# Patient Record
Sex: Female | Born: 1950
Health system: Southern US, Community
[De-identification: ages and names within clinical notes are randomized; demographics above are authoritative.]

## PROBLEM LIST (undated history)

## (undated) DIAGNOSIS — E039 Hypothyroidism, unspecified: Secondary | ICD-10-CM

## (undated) DIAGNOSIS — C50911 Malignant neoplasm of unspecified site of right female breast: Secondary | ICD-10-CM

## (undated) DIAGNOSIS — E7800 Pure hypercholesterolemia, unspecified: Secondary | ICD-10-CM

## (undated) DIAGNOSIS — E119 Type 2 diabetes mellitus without complications: Secondary | ICD-10-CM

## (undated) DIAGNOSIS — I499 Cardiac arrhythmia, unspecified: Secondary | ICD-10-CM

## (undated) DIAGNOSIS — E042 Nontoxic multinodular goiter: Secondary | ICD-10-CM

## (undated) DIAGNOSIS — Z96651 Presence of right artificial knee joint: Secondary | ICD-10-CM

## (undated) DIAGNOSIS — Z8614 Personal history of Methicillin resistant Staphylococcus aureus infection: Secondary | ICD-10-CM

## (undated) DIAGNOSIS — R011 Cardiac murmur, unspecified: Secondary | ICD-10-CM

## (undated) DIAGNOSIS — F40240 Claustrophobia: Secondary | ICD-10-CM

## (undated) DIAGNOSIS — Z9289 Personal history of other medical treatment: Secondary | ICD-10-CM

## (undated) DIAGNOSIS — M1711 Unilateral primary osteoarthritis, right knee: Secondary | ICD-10-CM

## (undated) DIAGNOSIS — Z973 Presence of spectacles and contact lenses: Secondary | ICD-10-CM

## (undated) DIAGNOSIS — Z8619 Personal history of other infectious and parasitic diseases: Secondary | ICD-10-CM

## (undated) DIAGNOSIS — G629 Polyneuropathy, unspecified: Secondary | ICD-10-CM

## (undated) DIAGNOSIS — K219 Gastro-esophageal reflux disease without esophagitis: Secondary | ICD-10-CM

## (undated) DIAGNOSIS — I10 Essential (primary) hypertension: Secondary | ICD-10-CM

## (undated) DIAGNOSIS — G971 Other reaction to spinal and lumbar puncture: Secondary | ICD-10-CM

## (undated) DIAGNOSIS — Z8719 Personal history of other diseases of the digestive system: Secondary | ICD-10-CM

## (undated) DIAGNOSIS — M1990 Unspecified osteoarthritis, unspecified site: Secondary | ICD-10-CM

## (undated) DIAGNOSIS — Z8669 Personal history of other diseases of the nervous system and sense organs: Secondary | ICD-10-CM

## (undated) DIAGNOSIS — Z0000 Encounter for general adult medical examination without abnormal findings: Principal | ICD-10-CM

## (undated) DIAGNOSIS — M5431 Sciatica, right side: Secondary | ICD-10-CM

## (undated) HISTORY — DX: Sciatica, right side: M54.31

## (undated) HISTORY — PX: VULVA / PERINEUM BIOPSY: SUR155

## (undated) HISTORY — PX: PLANTAR FASCIA SURGERY: SHX746

## (undated) HISTORY — PX: TUBAL LIGATION: SHX77

## (undated) HISTORY — DX: Encounter for general adult medical examination without abnormal findings: Z00.00

## (undated) HISTORY — DX: Presence of right artificial knee joint: Z96.651

## (undated) HISTORY — PX: BREAST BIOPSY: SHX20

## (undated) HISTORY — DX: Personal history of other infectious and parasitic diseases: Z86.19

## (undated) HISTORY — PX: ESOPHAGOGASTRODUODENOSCOPY: SHX1529

## (undated) HISTORY — PX: COLONOSCOPY: SHX174

## (undated) HISTORY — DX: Personal history of Methicillin resistant Staphylococcus aureus infection: Z86.14

## (undated) HISTORY — PX: HERNIA REPAIR: SHX51

---

## 1974-04-21 HISTORY — PX: TOTAL THYROIDECTOMY: SHX2547

## 1984-04-21 HISTORY — PX: UMBILICAL HERNIA REPAIR: SHX196

## 1984-04-21 HISTORY — PX: ABDOMINOPLASTY: SUR9

## 1986-04-21 HISTORY — PX: ABDOMINAL HYSTERECTOMY: SHX81

## 1997-04-21 HISTORY — PX: CARDIAC CATHETERIZATION: SHX172

## 1997-12-05 ENCOUNTER — Encounter: Admission: RE | Admit: 1997-12-05 | Discharge: 1997-12-05 | Payer: Self-pay | Admitting: *Deleted

## 1998-01-11 ENCOUNTER — Ambulatory Visit (HOSPITAL_COMMUNITY): Admission: RE | Admit: 1998-01-11 | Discharge: 1998-01-11 | Payer: Self-pay | Admitting: Internal Medicine

## 1998-01-16 ENCOUNTER — Encounter: Payer: Self-pay | Admitting: Internal Medicine

## 1998-01-16 ENCOUNTER — Ambulatory Visit (HOSPITAL_COMMUNITY): Admission: RE | Admit: 1998-01-16 | Discharge: 1998-01-16 | Payer: Self-pay | Admitting: Internal Medicine

## 1998-03-06 ENCOUNTER — Ambulatory Visit (HOSPITAL_COMMUNITY): Admission: RE | Admit: 1998-03-06 | Discharge: 1998-03-06 | Payer: Self-pay | Admitting: Internal Medicine

## 1999-03-11 ENCOUNTER — Other Ambulatory Visit: Admission: RE | Admit: 1999-03-11 | Discharge: 1999-03-11 | Payer: Self-pay | Admitting: Internal Medicine

## 2000-01-23 ENCOUNTER — Ambulatory Visit (HOSPITAL_COMMUNITY): Admission: RE | Admit: 2000-01-23 | Discharge: 2000-01-23 | Payer: Self-pay | Admitting: Gastroenterology

## 2000-07-16 ENCOUNTER — Other Ambulatory Visit: Admission: RE | Admit: 2000-07-16 | Discharge: 2000-07-16 | Payer: Self-pay | Admitting: Internal Medicine

## 2000-12-16 ENCOUNTER — Encounter: Payer: Self-pay | Admitting: Internal Medicine

## 2000-12-16 ENCOUNTER — Encounter: Admission: RE | Admit: 2000-12-16 | Discharge: 2000-12-16 | Payer: Self-pay | Admitting: Internal Medicine

## 2002-06-15 ENCOUNTER — Other Ambulatory Visit: Admission: RE | Admit: 2002-06-15 | Discharge: 2002-06-15 | Payer: Self-pay | Admitting: Obstetrics and Gynecology

## 2002-10-20 ENCOUNTER — Encounter: Payer: Self-pay | Admitting: Sports Medicine

## 2002-10-20 ENCOUNTER — Encounter: Admission: RE | Admit: 2002-10-20 | Discharge: 2002-10-20 | Payer: Self-pay | Admitting: Sports Medicine

## 2003-07-17 ENCOUNTER — Other Ambulatory Visit: Admission: RE | Admit: 2003-07-17 | Discharge: 2003-07-17 | Payer: Self-pay | Admitting: Obstetrics and Gynecology

## 2003-10-04 ENCOUNTER — Ambulatory Visit (HOSPITAL_COMMUNITY): Admission: RE | Admit: 2003-10-04 | Discharge: 2003-10-04 | Payer: Self-pay | Admitting: Gastroenterology

## 2003-10-04 ENCOUNTER — Encounter (INDEPENDENT_AMBULATORY_CARE_PROVIDER_SITE_OTHER): Payer: Self-pay | Admitting: Specialist

## 2004-03-22 ENCOUNTER — Ambulatory Visit (HOSPITAL_COMMUNITY): Admission: RE | Admit: 2004-03-22 | Discharge: 2004-03-22 | Payer: Self-pay | Admitting: Gastroenterology

## 2004-03-22 ENCOUNTER — Encounter (INDEPENDENT_AMBULATORY_CARE_PROVIDER_SITE_OTHER): Payer: Self-pay | Admitting: *Deleted

## 2004-05-14 ENCOUNTER — Encounter: Admission: RE | Admit: 2004-05-14 | Discharge: 2004-05-14 | Payer: Self-pay | Admitting: Internal Medicine

## 2004-08-28 ENCOUNTER — Encounter: Admission: RE | Admit: 2004-08-28 | Discharge: 2004-08-28 | Payer: Self-pay | Admitting: Internal Medicine

## 2005-04-21 HISTORY — PX: MASTECTOMY: SHX3

## 2005-06-23 ENCOUNTER — Encounter (INDEPENDENT_AMBULATORY_CARE_PROVIDER_SITE_OTHER): Payer: Self-pay | Admitting: *Deleted

## 2005-06-23 ENCOUNTER — Encounter (INDEPENDENT_AMBULATORY_CARE_PROVIDER_SITE_OTHER): Payer: Self-pay | Admitting: Diagnostic Radiology

## 2005-06-23 ENCOUNTER — Encounter: Admission: RE | Admit: 2005-06-23 | Discharge: 2005-06-23 | Payer: Self-pay | Admitting: Internal Medicine

## 2005-07-02 ENCOUNTER — Encounter: Admission: RE | Admit: 2005-07-02 | Discharge: 2005-07-02 | Payer: Self-pay | Admitting: Internal Medicine

## 2005-07-16 ENCOUNTER — Encounter (INDEPENDENT_AMBULATORY_CARE_PROVIDER_SITE_OTHER): Payer: Self-pay | Admitting: Surgery

## 2005-07-16 ENCOUNTER — Ambulatory Visit (HOSPITAL_BASED_OUTPATIENT_CLINIC_OR_DEPARTMENT_OTHER): Admission: RE | Admit: 2005-07-16 | Discharge: 2005-07-17 | Payer: Self-pay | Admitting: Surgery

## 2005-07-16 ENCOUNTER — Encounter (INDEPENDENT_AMBULATORY_CARE_PROVIDER_SITE_OTHER): Payer: Self-pay | Admitting: Specialist

## 2005-07-29 ENCOUNTER — Ambulatory Visit: Payer: Self-pay | Admitting: Oncology

## 2005-08-06 LAB — CBC WITH DIFFERENTIAL/PLATELET
BASO%: 0.7 % (ref 0.0–2.0)
Basophils Absolute: 0.1 10*3/uL (ref 0.0–0.1)
EOS%: 0.9 % (ref 0.0–7.0)
Eosinophils Absolute: 0.1 10*3/uL (ref 0.0–0.5)
HCT: 40.3 % (ref 34.8–46.6)
HGB: 13.3 g/dL (ref 11.6–15.9)
LYMPH%: 46 % (ref 14.0–48.0)
MCH: 30.7 pg (ref 26.0–34.0)
MCHC: 33 g/dL (ref 32.0–36.0)
MCV: 93 fL (ref 81.0–101.0)
MONO#: 0.7 10*3/uL (ref 0.1–0.9)
MONO%: 9.5 % (ref 0.0–13.0)
NEUT#: 3.3 10*3/uL (ref 1.5–6.5)
NEUT%: 42.9 % (ref 39.6–76.8)
Platelets: 320 10*3/uL (ref 145–400)
RBC: 4.33 10*6/uL (ref 3.70–5.32)
RDW: 13.6 % (ref 11.3–14.5)
WBC: 7.8 10*3/uL (ref 3.9–10.0)
lymph#: 3.6 10*3/uL — ABNORMAL HIGH (ref 0.9–3.3)

## 2005-08-06 LAB — COMPREHENSIVE METABOLIC PANEL
ALT: 19 U/L (ref 0–40)
AST: 16 U/L (ref 0–37)
Albumin: 4.8 g/dL (ref 3.5–5.2)
Alkaline Phosphatase: 69 U/L (ref 39–117)
BUN: 15 mg/dL (ref 6–23)
CO2: 27 mEq/L (ref 19–32)
Calcium: 9.7 mg/dL (ref 8.4–10.5)
Chloride: 101 mEq/L (ref 96–112)
Creatinine, Ser: 0.9 mg/dL (ref 0.4–1.2)
Glucose, Bld: 109 mg/dL — ABNORMAL HIGH (ref 70–99)
Potassium: 4.6 mEq/L (ref 3.5–5.3)
Sodium: 140 mEq/L (ref 135–145)
Total Bilirubin: 0.6 mg/dL (ref 0.3–1.2)
Total Protein: 7.5 g/dL (ref 6.0–8.3)

## 2005-08-06 LAB — CANCER ANTIGEN 27.29: CA 27.29: 16 U/mL (ref 0–39)

## 2005-08-06 LAB — LACTATE DEHYDROGENASE: LDH: 124 U/L (ref 94–250)

## 2005-08-07 ENCOUNTER — Ambulatory Visit (HOSPITAL_COMMUNITY): Admission: RE | Admit: 2005-08-07 | Discharge: 2005-08-07 | Payer: Self-pay | Admitting: Oncology

## 2005-09-29 ENCOUNTER — Ambulatory Visit: Payer: Self-pay | Admitting: Oncology

## 2005-10-01 LAB — CBC WITH DIFFERENTIAL/PLATELET
BASO%: 1.3 % (ref 0.0–2.0)
Basophils Absolute: 0.1 10*3/uL (ref 0.0–0.1)
EOS%: 2.4 % (ref 0.0–7.0)
Eosinophils Absolute: 0.2 10*3/uL (ref 0.0–0.5)
HCT: 39.3 % (ref 34.8–46.6)
HGB: 13 g/dL (ref 11.6–15.9)
LYMPH%: 45.8 % (ref 14.0–48.0)
MCH: 31 pg (ref 26.0–34.0)
MCHC: 33 g/dL (ref 32.0–36.0)
MCV: 94 fL (ref 81.0–101.0)
MONO#: 0.6 10*3/uL (ref 0.1–0.9)
MONO%: 7.3 % (ref 0.0–13.0)
NEUT#: 3.3 10*3/uL (ref 1.5–6.5)
NEUT%: 43.2 % (ref 39.6–76.8)
Platelets: 255 10*3/uL (ref 145–400)
RBC: 4.18 10*6/uL (ref 3.70–5.32)
RDW: 12.9 % (ref 11.3–14.5)
WBC: 7.7 10*3/uL (ref 3.9–10.0)
lymph#: 3.5 10*3/uL — ABNORMAL HIGH (ref 0.9–3.3)

## 2005-10-01 LAB — COMPREHENSIVE METABOLIC PANEL
ALT: 22 U/L (ref 0–40)
AST: 21 U/L (ref 0–37)
Albumin: 4.5 g/dL (ref 3.5–5.2)
Alkaline Phosphatase: 75 U/L (ref 39–117)
BUN: 17 mg/dL (ref 6–23)
CO2: 26 mEq/L (ref 19–32)
Calcium: 9.4 mg/dL (ref 8.4–10.5)
Chloride: 102 mEq/L (ref 96–112)
Creatinine, Ser: 0.91 mg/dL (ref 0.40–1.20)
Glucose, Bld: 121 mg/dL — ABNORMAL HIGH (ref 70–99)
Potassium: 4.4 mEq/L (ref 3.5–5.3)
Sodium: 138 mEq/L (ref 135–145)
Total Bilirubin: 0.5 mg/dL (ref 0.3–1.2)
Total Protein: 7.3 g/dL (ref 6.0–8.3)

## 2005-10-01 LAB — CANCER ANTIGEN 27.29: CA 27.29: 13 U/mL (ref 0–39)

## 2005-10-01 LAB — LACTATE DEHYDROGENASE: LDH: 142 U/L (ref 94–250)

## 2006-01-05 ENCOUNTER — Encounter: Admission: RE | Admit: 2006-01-05 | Discharge: 2006-01-05 | Payer: Self-pay | Admitting: Internal Medicine

## 2006-03-16 ENCOUNTER — Encounter: Admission: RE | Admit: 2006-03-16 | Discharge: 2006-06-14 | Payer: Self-pay | Admitting: Internal Medicine

## 2006-03-24 ENCOUNTER — Ambulatory Visit: Payer: Self-pay | Admitting: Oncology

## 2006-03-25 LAB — COMPREHENSIVE METABOLIC PANEL
ALT: 19 U/L (ref 0–35)
AST: 17 U/L (ref 0–37)
Albumin: 4.7 g/dL (ref 3.5–5.2)
Alkaline Phosphatase: 76 U/L (ref 39–117)
BUN: 15 mg/dL (ref 6–23)
CO2: 26 mEq/L (ref 19–32)
Calcium: 9.8 mg/dL (ref 8.4–10.5)
Chloride: 106 mEq/L (ref 96–112)
Creatinine, Ser: 0.87 mg/dL (ref 0.40–1.20)
Glucose, Bld: 107 mg/dL — ABNORMAL HIGH (ref 70–99)
Potassium: 4.2 mEq/L (ref 3.5–5.3)
Sodium: 141 mEq/L (ref 135–145)
Total Bilirubin: 0.6 mg/dL (ref 0.3–1.2)
Total Protein: 7.3 g/dL (ref 6.0–8.3)

## 2006-03-25 LAB — CBC WITH DIFFERENTIAL/PLATELET
BASO%: 0.8 % (ref 0.0–2.0)
Basophils Absolute: 0.1 10*3/uL (ref 0.0–0.1)
EOS%: 1.8 % (ref 0.0–7.0)
Eosinophils Absolute: 0.1 10*3/uL (ref 0.0–0.5)
HCT: 39.2 % (ref 34.8–46.6)
HGB: 13.1 g/dL (ref 11.6–15.9)
LYMPH%: 49.7 % — ABNORMAL HIGH (ref 14.0–48.0)
MCH: 31 pg (ref 26.0–34.0)
MCHC: 33.4 g/dL (ref 32.0–36.0)
MCV: 92.6 fL (ref 81.0–101.0)
MONO#: 0.5 10*3/uL (ref 0.1–0.9)
MONO%: 7 % (ref 0.0–13.0)
NEUT#: 2.9 10*3/uL (ref 1.5–6.5)
NEUT%: 40.7 % (ref 39.6–76.8)
Platelets: 284 10*3/uL (ref 145–400)
RBC: 4.24 10*6/uL (ref 3.70–5.32)
RDW: 12.8 % (ref 11.3–14.5)
WBC: 7.1 10*3/uL (ref 3.9–10.0)
lymph#: 3.5 10*3/uL — ABNORMAL HIGH (ref 0.9–3.3)

## 2006-03-25 LAB — CANCER ANTIGEN 27.29: CA 27.29: 12 U/mL (ref 0–39)

## 2006-07-20 ENCOUNTER — Encounter: Admission: RE | Admit: 2006-07-20 | Discharge: 2006-10-18 | Payer: Self-pay | Admitting: Internal Medicine

## 2006-09-21 ENCOUNTER — Ambulatory Visit: Payer: Self-pay | Admitting: Oncology

## 2006-09-23 LAB — CBC WITH DIFFERENTIAL/PLATELET
BASO%: 0.7 % (ref 0.0–2.0)
Basophils Absolute: 0 10*3/uL (ref 0.0–0.1)
EOS%: 2.4 % (ref 0.0–7.0)
Eosinophils Absolute: 0.2 10*3/uL (ref 0.0–0.5)
HCT: 39 % (ref 34.8–46.6)
HGB: 13.4 g/dL (ref 11.6–15.9)
LYMPH%: 47.4 % (ref 14.0–48.0)
MCH: 31.7 pg (ref 26.0–34.0)
MCHC: 34.5 g/dL (ref 32.0–36.0)
MCV: 92.1 fL (ref 81.0–101.0)
MONO#: 0.6 10*3/uL (ref 0.1–0.9)
MONO%: 8.5 % (ref 0.0–13.0)
NEUT#: 3 10*3/uL (ref 1.5–6.5)
NEUT%: 41 % (ref 39.6–76.8)
Platelets: 272 10*3/uL (ref 145–400)
RBC: 4.24 10*6/uL (ref 3.70–5.32)
RDW: 12.7 % (ref 11.3–14.5)
WBC: 7.3 10*3/uL (ref 3.9–10.0)
lymph#: 3.5 10*3/uL — ABNORMAL HIGH (ref 0.9–3.3)

## 2006-09-23 LAB — COMPREHENSIVE METABOLIC PANEL
ALT: 45 U/L — ABNORMAL HIGH (ref 0–35)
AST: 33 U/L (ref 0–37)
Albumin: 4.7 g/dL (ref 3.5–5.2)
Alkaline Phosphatase: 74 U/L (ref 39–117)
BUN: 16 mg/dL (ref 6–23)
CO2: 25 mEq/L (ref 19–32)
Calcium: 9.9 mg/dL (ref 8.4–10.5)
Chloride: 104 mEq/L (ref 96–112)
Creatinine, Ser: 0.83 mg/dL (ref 0.40–1.20)
Glucose, Bld: 87 mg/dL (ref 70–99)
Potassium: 4.4 mEq/L (ref 3.5–5.3)
Sodium: 139 mEq/L (ref 135–145)
Total Bilirubin: 0.5 mg/dL (ref 0.3–1.2)
Total Protein: 7.4 g/dL (ref 6.0–8.3)

## 2006-09-23 LAB — CANCER ANTIGEN 27.29: CA 27.29: 14 U/mL (ref 0–39)

## 2007-03-22 ENCOUNTER — Ambulatory Visit: Payer: Self-pay | Admitting: Oncology

## 2007-04-29 LAB — CBC WITH DIFFERENTIAL/PLATELET
BASO%: 0.4 % (ref 0.0–2.0)
Basophils Absolute: 0 10*3/uL (ref 0.0–0.1)
EOS%: 1.1 % (ref 0.0–7.0)
Eosinophils Absolute: 0.1 10*3/uL (ref 0.0–0.5)
HCT: 41.7 % (ref 34.8–46.6)
HGB: 14.2 g/dL (ref 11.6–15.9)
LYMPH%: 53.8 % — ABNORMAL HIGH (ref 14.0–48.0)
MCH: 31.6 pg (ref 26.0–34.0)
MCHC: 34 g/dL (ref 32.0–36.0)
MCV: 92.8 fL (ref 81.0–101.0)
MONO#: 0.4 10*3/uL (ref 0.1–0.9)
MONO%: 6.2 % (ref 0.0–13.0)
NEUT#: 2.7 10*3/uL (ref 1.5–6.5)
NEUT%: 38.5 % — ABNORMAL LOW (ref 39.6–76.8)
Platelets: 286 10*3/uL (ref 145–400)
RBC: 4.49 10*6/uL (ref 3.70–5.32)
RDW: 13 % (ref 11.3–14.5)
WBC: 7.1 10*3/uL (ref 3.9–10.0)
lymph#: 3.8 10*3/uL — ABNORMAL HIGH (ref 0.9–3.3)

## 2007-04-29 LAB — COMPREHENSIVE METABOLIC PANEL
ALT: 19 U/L (ref 0–35)
AST: 18 U/L (ref 0–37)
Albumin: 4.7 g/dL (ref 3.5–5.2)
Alkaline Phosphatase: 65 U/L (ref 39–117)
BUN: 13 mg/dL (ref 6–23)
CO2: 25 mEq/L (ref 19–32)
Calcium: 9.6 mg/dL (ref 8.4–10.5)
Chloride: 106 mEq/L (ref 96–112)
Creatinine, Ser: 0.79 mg/dL (ref 0.40–1.20)
Glucose, Bld: 101 mg/dL — ABNORMAL HIGH (ref 70–99)
Potassium: 4.3 mEq/L (ref 3.5–5.3)
Sodium: 143 mEq/L (ref 135–145)
Total Bilirubin: 0.7 mg/dL (ref 0.3–1.2)
Total Protein: 7.4 g/dL (ref 6.0–8.3)

## 2007-04-29 LAB — CANCER ANTIGEN 27.29: CA 27.29: 11 U/mL (ref 0–39)

## 2007-10-11 ENCOUNTER — Ambulatory Visit: Payer: Self-pay | Admitting: Oncology

## 2007-10-13 LAB — CBC WITH DIFFERENTIAL/PLATELET
BASO%: 0.6 % (ref 0.0–2.0)
Basophils Absolute: 0 10*3/uL (ref 0.0–0.1)
EOS%: 1.6 % (ref 0.0–7.0)
Eosinophils Absolute: 0.1 10*3/uL (ref 0.0–0.5)
HCT: 39.2 % (ref 34.8–46.6)
HGB: 13.2 g/dL (ref 11.6–15.9)
LYMPH%: 42.8 % (ref 14.0–48.0)
MCH: 31.2 pg (ref 26.0–34.0)
MCHC: 33.6 g/dL (ref 32.0–36.0)
MCV: 92.9 fL (ref 81.0–101.0)
MONO#: 0.6 10*3/uL (ref 0.1–0.9)
MONO%: 8.6 % (ref 0.0–13.0)
NEUT#: 3.2 10*3/uL (ref 1.5–6.5)
NEUT%: 46.4 % (ref 39.6–76.8)
Platelets: 264 10*3/uL (ref 145–400)
RBC: 4.22 10*6/uL (ref 3.70–5.32)
RDW: 12.8 % (ref 11.3–14.5)
WBC: 6.8 10*3/uL (ref 3.9–10.0)
lymph#: 2.9 10*3/uL (ref 0.9–3.3)

## 2007-10-13 LAB — COMPREHENSIVE METABOLIC PANEL
ALT: 19 U/L (ref 0–35)
AST: 17 U/L (ref 0–37)
Albumin: 4.3 g/dL (ref 3.5–5.2)
Alkaline Phosphatase: 63 U/L (ref 39–117)
BUN: 14 mg/dL (ref 6–23)
CO2: 27 mEq/L (ref 19–32)
Calcium: 9.5 mg/dL (ref 8.4–10.5)
Chloride: 104 mEq/L (ref 96–112)
Creatinine, Ser: 0.86 mg/dL (ref 0.40–1.20)
Glucose, Bld: 89 mg/dL (ref 70–99)
Potassium: 4.5 mEq/L (ref 3.5–5.3)
Sodium: 140 mEq/L (ref 135–145)
Total Bilirubin: 0.6 mg/dL (ref 0.3–1.2)
Total Protein: 6.8 g/dL (ref 6.0–8.3)

## 2007-10-13 LAB — CANCER ANTIGEN 27.29: CA 27.29: 21 U/mL (ref 0–39)

## 2008-02-28 ENCOUNTER — Ambulatory Visit: Payer: Self-pay | Admitting: Oncology

## 2008-03-01 LAB — CBC WITH DIFFERENTIAL/PLATELET
BASO%: 0.9 % (ref 0.0–2.0)
Basophils Absolute: 0.1 10*3/uL (ref 0.0–0.1)
EOS%: 1.7 % (ref 0.0–7.0)
Eosinophils Absolute: 0.1 10*3/uL (ref 0.0–0.5)
HCT: 40.1 % (ref 34.8–46.6)
HGB: 13.6 g/dL (ref 11.6–15.9)
LYMPH%: 44.4 % (ref 14.0–48.0)
MCH: 32.1 pg (ref 26.0–34.0)
MCHC: 33.8 g/dL (ref 32.0–36.0)
MCV: 94.9 fL (ref 81.0–101.0)
MONO#: 0.5 10*3/uL (ref 0.1–0.9)
MONO%: 7.6 % (ref 0.0–13.0)
NEUT#: 3.3 10*3/uL (ref 1.5–6.5)
NEUT%: 45.4 % (ref 39.6–76.8)
Platelets: 236 10*3/uL (ref 145–400)
RBC: 4.23 10*6/uL (ref 3.70–5.32)
RDW: 12.7 % (ref 11.3–14.5)
WBC: 7.2 10*3/uL (ref 3.9–10.0)
lymph#: 3.2 10*3/uL (ref 0.9–3.3)

## 2008-03-01 LAB — COMPREHENSIVE METABOLIC PANEL
ALT: 21 U/L (ref 0–35)
AST: 22 U/L (ref 0–37)
Albumin: 4.2 g/dL (ref 3.5–5.2)
Alkaline Phosphatase: 61 U/L (ref 39–117)
BUN: 14 mg/dL (ref 6–23)
CO2: 29 mEq/L (ref 19–32)
Calcium: 9.7 mg/dL (ref 8.4–10.5)
Chloride: 104 mEq/L (ref 96–112)
Creatinine, Ser: 0.86 mg/dL (ref 0.40–1.20)
Glucose, Bld: 97 mg/dL (ref 70–99)
Potassium: 4.3 mEq/L (ref 3.5–5.3)
Sodium: 140 mEq/L (ref 135–145)
Total Bilirubin: 0.7 mg/dL (ref 0.3–1.2)
Total Protein: 7.1 g/dL (ref 6.0–8.3)

## 2008-12-06 ENCOUNTER — Encounter: Admission: RE | Admit: 2008-12-06 | Discharge: 2008-12-06 | Payer: Self-pay | Admitting: Internal Medicine

## 2008-12-21 ENCOUNTER — Ambulatory Visit: Payer: Self-pay | Admitting: Oncology

## 2008-12-26 LAB — COMPREHENSIVE METABOLIC PANEL
ALT: 22 U/L (ref 0–35)
AST: 22 U/L (ref 0–37)
Albumin: 3.9 g/dL (ref 3.5–5.2)
Alkaline Phosphatase: 55 U/L (ref 39–117)
BUN: 16 mg/dL (ref 6–23)
CO2: 28 mEq/L (ref 19–32)
Calcium: 9.5 mg/dL (ref 8.4–10.5)
Chloride: 109 mEq/L (ref 96–112)
Creatinine, Ser: 0.86 mg/dL (ref 0.40–1.20)
Glucose, Bld: 118 mg/dL — ABNORMAL HIGH (ref 70–99)
Potassium: 4.2 mEq/L (ref 3.5–5.3)
Sodium: 142 mEq/L (ref 135–145)
Total Bilirubin: 0.7 mg/dL (ref 0.3–1.2)
Total Protein: 6.8 g/dL (ref 6.0–8.3)

## 2008-12-26 LAB — CBC WITH DIFFERENTIAL/PLATELET
BASO%: 0.6 % (ref 0.0–2.0)
Basophils Absolute: 0 10*3/uL (ref 0.0–0.1)
EOS%: 1.8 % (ref 0.0–7.0)
Eosinophils Absolute: 0.1 10*3/uL (ref 0.0–0.5)
HCT: 40 % (ref 34.8–46.6)
HGB: 13.4 g/dL (ref 11.6–15.9)
LYMPH%: 46.5 % (ref 14.0–49.7)
MCH: 30.9 pg (ref 25.1–34.0)
MCHC: 33.5 g/dL (ref 31.5–36.0)
MCV: 92.2 fL (ref 79.5–101.0)
MONO#: 0.5 10*3/uL (ref 0.1–0.9)
MONO%: 6.9 % (ref 0.0–14.0)
NEUT#: 3.1 10*3/uL (ref 1.5–6.5)
NEUT%: 44.2 % (ref 38.4–76.8)
Platelets: 210 10*3/uL (ref 145–400)
RBC: 4.34 10*6/uL (ref 3.70–5.45)
RDW: 12.5 % (ref 11.2–14.5)
WBC: 7.1 10*3/uL (ref 3.9–10.3)
lymph#: 3.3 10*3/uL (ref 0.9–3.3)

## 2010-01-08 ENCOUNTER — Ambulatory Visit: Payer: Self-pay | Admitting: Oncology

## 2010-01-10 LAB — CBC WITH DIFFERENTIAL/PLATELET
BASO%: 0.6 % (ref 0.0–2.0)
Basophils Absolute: 0.1 10*3/uL (ref 0.0–0.1)
EOS%: 1.3 % (ref 0.0–7.0)
Eosinophils Absolute: 0.1 10*3/uL (ref 0.0–0.5)
HCT: 40 % (ref 34.8–46.6)
HGB: 13.4 g/dL (ref 11.6–15.9)
LYMPH%: 40 % (ref 14.0–49.7)
MCH: 32.1 pg (ref 25.1–34.0)
MCHC: 33.5 g/dL (ref 31.5–36.0)
MCV: 96 fL (ref 79.5–101.0)
MONO#: 0.6 10*3/uL (ref 0.1–0.9)
MONO%: 7 % (ref 0.0–14.0)
NEUT#: 4.2 10*3/uL (ref 1.5–6.5)
NEUT%: 51.1 % (ref 38.4–76.8)
Platelets: 252 10*3/uL (ref 145–400)
RBC: 4.17 10*6/uL (ref 3.70–5.45)
RDW: 12.7 % (ref 11.2–14.5)
WBC: 8.2 10*3/uL (ref 3.9–10.3)
lymph#: 3.3 10*3/uL (ref 0.9–3.3)

## 2010-01-11 LAB — COMPREHENSIVE METABOLIC PANEL
ALT: 23 U/L (ref 0–35)
AST: 25 U/L (ref 0–37)
Albumin: 4.6 g/dL (ref 3.5–5.2)
Alkaline Phosphatase: 62 U/L (ref 39–117)
BUN: 18 mg/dL (ref 6–23)
CO2: 22 mEq/L (ref 19–32)
Calcium: 9.9 mg/dL (ref 8.4–10.5)
Chloride: 105 mEq/L (ref 96–112)
Creatinine, Ser: 0.89 mg/dL (ref 0.40–1.20)
Glucose, Bld: 150 mg/dL — ABNORMAL HIGH (ref 70–99)
Potassium: 4.5 mEq/L (ref 3.5–5.3)
Sodium: 143 mEq/L (ref 135–145)
Total Bilirubin: 0.4 mg/dL (ref 0.3–1.2)
Total Protein: 6.9 g/dL (ref 6.0–8.3)

## 2010-01-11 LAB — CANCER ANTIGEN 27.29: CA 27.29: 15 U/mL (ref 0–39)

## 2010-01-11 LAB — VITAMIN D 25 HYDROXY (VIT D DEFICIENCY, FRACTURES): Vit D, 25-Hydroxy: 61 ng/mL (ref 30–89)

## 2010-04-18 ENCOUNTER — Encounter
Admission: RE | Admit: 2010-04-18 | Discharge: 2010-04-18 | Payer: Self-pay | Source: Home / Self Care | Attending: Internal Medicine | Admitting: Internal Medicine

## 2010-05-12 ENCOUNTER — Encounter: Payer: Self-pay | Admitting: Internal Medicine

## 2010-09-02 ENCOUNTER — Other Ambulatory Visit: Payer: Self-pay | Admitting: Oncology

## 2010-09-02 ENCOUNTER — Encounter (HOSPITAL_BASED_OUTPATIENT_CLINIC_OR_DEPARTMENT_OTHER): Payer: Medicare HMO | Admitting: Oncology

## 2010-09-02 DIAGNOSIS — C50919 Malignant neoplasm of unspecified site of unspecified female breast: Secondary | ICD-10-CM

## 2010-09-02 DIAGNOSIS — Z170 Estrogen receptor positive status [ER+]: Secondary | ICD-10-CM

## 2010-09-02 LAB — COMPREHENSIVE METABOLIC PANEL
ALT: 16 U/L (ref 0–35)
AST: 16 U/L (ref 0–37)
Albumin: 4 g/dL (ref 3.5–5.2)
Alkaline Phosphatase: 62 U/L (ref 39–117)
BUN: 18 mg/dL (ref 6–23)
CO2: 27 mEq/L (ref 19–32)
Calcium: 9.2 mg/dL (ref 8.4–10.5)
Chloride: 106 mEq/L (ref 96–112)
Creatinine, Ser: 0.91 mg/dL (ref 0.40–1.20)
Glucose, Bld: 111 mg/dL — ABNORMAL HIGH (ref 70–99)
Potassium: 4.2 mEq/L (ref 3.5–5.3)
Sodium: 141 mEq/L (ref 135–145)
Total Bilirubin: 0.3 mg/dL (ref 0.3–1.2)
Total Protein: 6.8 g/dL (ref 6.0–8.3)

## 2010-09-02 LAB — CBC WITH DIFFERENTIAL/PLATELET
BASO%: 0.4 % (ref 0.0–2.0)
Basophils Absolute: 0 10*3/uL (ref 0.0–0.1)
EOS%: 1 % (ref 0.0–7.0)
Eosinophils Absolute: 0.1 10*3/uL (ref 0.0–0.5)
HCT: 40.5 % (ref 34.8–46.6)
HGB: 13.8 g/dL (ref 11.6–15.9)
LYMPH%: 42.9 % (ref 14.0–49.7)
MCH: 31.6 pg (ref 25.1–34.0)
MCHC: 34.1 g/dL (ref 31.5–36.0)
MCV: 92.7 fL (ref 79.5–101.0)
MONO#: 0.5 10*3/uL (ref 0.1–0.9)
MONO%: 5.9 % (ref 0.0–14.0)
NEUT#: 3.9 10*3/uL (ref 1.5–6.5)
NEUT%: 49.8 % (ref 38.4–76.8)
Platelets: 250 10*3/uL (ref 145–400)
RBC: 4.37 10*6/uL (ref 3.70–5.45)
RDW: 13.2 % (ref 11.2–14.5)
WBC: 7.9 10*3/uL (ref 3.9–10.3)
lymph#: 3.4 10*3/uL — ABNORMAL HIGH (ref 0.9–3.3)

## 2010-09-06 NOTE — Procedures (Signed)
[place]  Patient:    Victoria Walker, Victoria Walker                  MRN: [id number] Proc. Date: 01/23/00 Adm. Date:  [id number] Attending:  Charna Elizabeth CC:         Lind Guest. August Saucer, M.D.   Procedure Report  DATE OF BIRTH:  November 21, 1990  PROCEDURE:  Esophagogastroduodenoscopy.  ENDOSCOPIST:  Anselmo Rod, M.D.  INSTRUMENT:  Olympus video panendoscope.  INDICATIONS:  Epigastric pain with severe reflux in a 60 year old black female; rule out peptic ulcer disease, esophagitis, gastritis.  PREPROCEDURE PREPARATION:  Informed consent was procured from the patient. The patient was fasted for eight hours prior to the procedure.  PREPROCEDURE PHYSICAL:  VITAL SIGNS:  The patient had stable vital signs.  NECK:  Supple.  CHEST:  Clear to auscultation.  HEART:  S1, S2, regular.  ABDOMEN: Soft with normal abdominal bowel sounds.  DESCRIPTION OF PROCEDURE:  The patient was placed in the left lateral decubitus position and sedated with 40 mg of Demerol, 4 mg of Versed intravenously.  Once the patient was adequately sedated and maintained on low flow oxygen and continuous cardiac monitoring, the Olympus video panendoscope was advanced through the mouthpiece, over the tongue and into the esophagus under direct vision.  The entire esophagus appeared normal without evidence of ring, stricture, masses, lesions or esophagitis.  The scope was then advanced in the stomach.  The entire gastric mucosa appeared healthy and do did the proximal small bowel for 60 cm.  IMPRESSION:  Normal EGD.  RECOMMENDATIONS:  The patient is to follow up in the office on an outpatient basis.  Further recommendations will be made at that time.  Avoidance of nonsteroidals has been emphasized.  Antireflux measures have been discussed with her. DD:  01/23/00 TD:  01/23/00 Job: [id number]

## 2010-09-06 NOTE — Op Note (Signed)
NAMEBONNEE, Victoria Walker           ACCOUNT NO.:  [id number]   MEDICAL RECORD NO.:  [id number]          PATIENT TYPE:  AMB   LOCATION:  ENDO                         FACILITY:  [facility]   PHYSICIAN:  Anselmo Rod, M.D.  DATE OF BIRTH:  1951-04-18   DATE OF PROCEDURE:  03/22/2004  DATE OF DISCHARGE:                                 OPERATIVE REPORT   PROCEDURE PERFORMED:  Esophagogastroduodenoscopy with snare polypectomy x1.   ENDOSCOPIST:  Anselmo Rod, M.D.   INSTRUMENT USED:  Olympus video panendoscope.   INDICATION FOR PROCEDURE:  A 60 year old African-American female with a  history of guaiac-positive stools.  Rule out peptic ulcer disease,  esophagitis, gastritis, etc.   PREPROCEDURE PREPARATION:  Informed consent was procured from the patient.  The patient was fasted for eight hours prior to the procedure.   PREPROCEDURE PHYSICAL:  VITAL SIGNS:  The patient had stable vital signs.  NECK:  Supple.  CHEST:  Clear to auscultation.  S1, S2 regular.  ABDOMEN:  Soft with normal bowel sounds.   DESCRIPTION OF PROCEDURE:  The patient was placed in the left lateral  decubitus position and sedated with 80 mg of Demerol and 10 mg of Versed in  slow incremental doses.  Once the patient was adequately sedate and  maintained on low-flow oxygen and continuous cardiac monitoring, the Olympus  video panendoscope was advanced through the mouthpiece, over the tongue,  into the esophagus under direct vision.  The entire esophagus appeared  normal with no evidence of ring, stricture, mass, esophagitis, of Barrett's  mucosa.  The scope was then passed into the stomach.  A small sessile polyp  was snared from the antrum.  A small hiatal hernia was seen on high  retroflexion.  The proximal small bowel appeared normal.  There was no  outlet obstruction.  The patient tolerated the procedure well without  complications.   IMPRESSION:  1.  Small sessile polyp snared from antrum.  2.  Small  hiatal hernia.  3.  Normal-appearing esophagus and proximal small bowel.   RECOMMENDATIONS:  1.  Await pathology results.  2.  Avoid all nonsteroidals for the next four weeks.  3.  Repeat guaiacs on an outpatient basis.  4.  Outpatient follow-up in the next two weeks for further recommendations.      Jyot   JNM/MEDQ  D:  03/22/2004  T:  03/23/2004  Job:  [id number]   cc:   Maxie Better, M.D.  [address]  [place]  [place] [address]  Fax: [phone]   Lind Guest. August Saucer, M.D.  [address] [address]  [place]  [place] [address]  Fax: [phone]

## 2010-09-06 NOTE — Op Note (Signed)
Victoria Walker, Victoria Walker           ACCOUNT NO.:  [id number]   MEDICAL RECORD NO.:  [id number]          PATIENT TYPE:  AMB   LOCATION:  DSC                          FACILITY:  [facility]   PHYSICIAN:  Thomas A. Cornett, M.D.DATE OF BIRTH:  20-Aug-1950   DATE OF PROCEDURE:  07/16/2005  DATE OF DISCHARGE:                                 OPERATIVE REPORT   PREOPERATIVE DIAGNOSIS:  Right breast ductal carcinoma in situ.   POSTOP DIAGNOSIS:  Right breast ductal carcinoma in situ.   PROCEDURE:  1.  Right simple mastectomy.  2.  Attempted right breast sentinel lymph node mapping with methylene blue      dye.   SURGEON:  Maisie Fus A. Cornett, M.D.   ASSISTANTAngelia Mould. Derrell Lolling, M.D.   ANESTHESIA:  LMA.   ESTIMATED BLOOD LOSS:  50 mL.   SPECIMEN:  Right breast with level I axillary contents to pathology, suture  marks lateral margins.   DRAINS:  Two 19-Blake drains to incision.   INDICATIONS FOR PROCEDURE:  The patient is a 60 year old postmenopausal  female found to have a cluster of calcifications in the right upper outer  quadrant of her breast.  Biopsy showed DCIS.  After discussion of treatment  options she wished to undergo a mastectomy.  I explained the procedure as  well as the need for sentinel lymph node mapping.  She understood both the  above and agreed to proceed after being informed about the procedure,  alternative procedures and potential complications.   DESCRIPTION OF PROCEDURE:  The patient was brought to the operating room  after injection of technetium sulfa colloid in the right breast by nuclear  medicine.  She was then brought to the operating suite and placed supine.  After induction of LMA anesthesia, the right chest, neck, and right axilla  were prepped and draped in a sterile fashion.  The NeoProbe was used; and  there were extremely high.  Baseline counts at the injection site with  absolutely no activity in the right axilla.  Prior to this, I injected 4 mL  of methylene blue dye massaged for 2-3 minutes.  Again, there was no  radioactivity in the right axilla at all.  The internal mammary nodal basin  as well as the subclavian nodal basin had no activity as well.   In light of these findings, I elected to go ahead and proceed with  mastectomy and then try to localize these nodes with the blue dye once we  got over to the axilla.  Skin incisions were made above and below the nipple  areolar complex in a curvilinear fashion to raise both superior inferior  flaps.  Superior flap was taken down first.  This was taken all the way to  the base of the clavicle.  This was taken down to the muscle of the  pectoralis major.  Inferior flap was raised in a similar fashion taking down  to below the inferior mammary fold to the abdominal wall.  Once the flaps  were raised the breast was then dissected in a medial to lateral and  superior to inferior fashion until  was taken off the chest wall completely.  Once I entered the axilla, again, I found no blue dye or other evident  sentinel node.  I went ahead and sampled some of the level I axillary nodes  and sent that with the specimen.   At this point in time, she had no bulky adenopathy that I could palpate or  any other significant abnormalities in her axilla.  This was sent as one  specimen.  Suture mark at the lateral margin.  Irrigation was used and  suctioned out.  There was some oozing, and I controlled this with cautery.  I placed two 19-Blake drains through separate stab incisions in the inferior  flap, and secured them with 3-0 nylon.  I then tacked the flaps down to the  chest wall with interrupted 3-0 Vicryl.  At this point I reapproximated the  skin edges with 3-0 Vicryl, and 4-0 Monocryl was used to close the wound in  a subcuticular fashion.  All final counts of sponge, needle, and instruments  were found be correct.  The bulbs for the [facility] drains were placed to  suction.  Sterile dressings  were applied.  The patient was then awoke, taken  to recovery in satisfactory condition.      Thomas A. Cornett, M.D.  Electronically Signed     TAC/MEDQ  D:  07/16/2005  T:  07/17/2005  Job:  [id number]

## 2010-09-06 NOTE — Op Note (Signed)
NAME:  Victoria Walker, Victoria Walker                     ACCOUNT NO.:  [id number]   MEDICAL RECORD NO.:  [id number]                   PATIENT TYPE:  AMB   LOCATION:  ENDO                                 FACILITY:  [facility]   PHYSICIAN:  Anselmo Rod, M.D.               DATE OF BIRTH:  Nov 01, 1950   DATE OF PROCEDURE:  10/04/2003  DATE OF DISCHARGE:                                 OPERATIVE REPORT   PROCEDURES:  Colonoscopy.   ENDOSCOPIST:  Charna Elizabeth, M.D.   INSTRUMENT USED:  Olympus video colonoscope.   INDICATIONS FOR PROCEDURE:  A 60 year old African-American female undergoing  a screening colonoscopy to rule out colonic polyps, masses, etc.   PREPROCEDURE PREPARATION:  Informed consent was procured from the patient.  The patient was fasted for 8 hr prior to the procedure and prepped with a  bottle of magnesium citrate and a gallon of GoLYTELY the night prior to the  procedure.   PREPROCEDURE PHYSICAL:  VITAL SIGNS:  Stable.  NECK:  Supple.  CHEST:  Clear to auscultation.  HEART:  S1, S2 regular.  ABDOMEN:  Soft with normal bowel sounds.   DESCRIPTION OF PROCEDURE:  The patient was placed in the left lateral  decubitus position and sedated with 90 mg Demerol and 8 mg Versed in slow  incremental doses.  Once the patient was adequately sedated and maintained  on low-flow oxygen and continuous cardiac monitoring, the Olympus video  colonoscope was advanced from the rectum to the cecum.  The patient's  position had to be turned from the left lateral to the spine position, with  gentle application of abdominal pressure to reach the cecal base.  The  appendiceal orifice and the ileocecal valve were clearly visualized and  photographed.  An 8-10 mm sessile polyp was snared from the proximal right  colon.  This was removed by Lucina Mellow retrieval basket, as the polyp was large  and could not pass through the scope.  The scope was completely withdrawn  and the polyp retrieved from the basket.   The scope was then reinserted and  the procedure completed again up to the cecum.  Careful visualization  revealed no evidence of masses or polyps, besides the one mentioned above.  Retroflexion of the rectum revealed no abnormalities.  The patient tolerated  the procedure well and without immediate complications.   IMPRESSION:  An 8-10 mm polyp snared from proximal right colon, retrieved by  Lucina Mellow retrieval basket.  Otherwise, unrevealing colonoscopy.   RECOMMENDATIONS:  1. Await pathology results.  2. Avoid nonsteroidals, including aspirin for the next four weeks.  3. Outpatient follow-up in the next two weeks for further recommendations.  4. Repeat colonoscopy will be done depending on pathology results.  Anselmo Rod, M.D.    JNM/MEDQ  D:  10/04/2003  T:  10/04/2003  Job:  [id number]   cc:   Maxie Better, M.D.  [address]  [place]  [place] [address]  Fax: [phone]   Lind Guest. August Saucer, M.D.  [address] [address]  [place]  [place] [address]  Fax: [phone]

## 2010-09-09 ENCOUNTER — Encounter (HOSPITAL_BASED_OUTPATIENT_CLINIC_OR_DEPARTMENT_OTHER): Payer: Medicare HMO | Admitting: Oncology

## 2010-09-09 DIAGNOSIS — Z170 Estrogen receptor positive status [ER+]: Secondary | ICD-10-CM

## 2010-09-09 DIAGNOSIS — C50919 Malignant neoplasm of unspecified site of unspecified female breast: Secondary | ICD-10-CM

## 2010-10-22 ENCOUNTER — Encounter (HOSPITAL_BASED_OUTPATIENT_CLINIC_OR_DEPARTMENT_OTHER): Payer: Medicare HMO | Admitting: Oncology

## 2010-10-22 DIAGNOSIS — Z170 Estrogen receptor positive status [ER+]: Secondary | ICD-10-CM

## 2010-10-22 DIAGNOSIS — C50919 Malignant neoplasm of unspecified site of unspecified female breast: Secondary | ICD-10-CM

## 2011-03-28 ENCOUNTER — Telehealth: Payer: Self-pay | Admitting: Oncology

## 2011-03-28 NOTE — Telephone Encounter (Signed)
pt called and confirmed appt for 05/06/2011

## 2011-03-28 NOTE — Telephone Encounter (Signed)
called pt lmovm for appt on 05/06/2011 and to rtn call to confirm appts.  pt was already scheduled for a u/s of the left breast for 04/28/2011 @ [facility]

## 2011-05-05 ENCOUNTER — Encounter: Payer: Self-pay | Admitting: *Deleted

## 2011-05-06 ENCOUNTER — Ambulatory Visit (HOSPITAL_BASED_OUTPATIENT_CLINIC_OR_DEPARTMENT_OTHER): Payer: Medicare Other | Admitting: Oncology

## 2011-05-06 VITALS — BP 137/81 | HR 75 | Temp 98.2°F | Ht 65.0 in | Wt 177.2 lb

## 2011-05-06 DIAGNOSIS — C50919 Malignant neoplasm of unspecified site of unspecified female breast: Secondary | ICD-10-CM | POA: Insufficient documentation

## 2011-05-06 DIAGNOSIS — Z853 Personal history of malignant neoplasm of breast: Secondary | ICD-10-CM | POA: Insufficient documentation

## 2011-05-06 NOTE — Progress Notes (Signed)
ID: Victoria Walker  DOB: November 06, 1950  MR#: [id number]  CSN#: [id number]   Interval History:   Victoria Walker returns today to discuss results of her repeat left breast ultrasonography. We had release her from followup, but she found a "bump" in her left breast. This was evaluated at [facility] and found to be an intramammary lymph node. She was set up for a 6 month repeat which was done just last week.  ROS:  The review of systems is otherwise unremarkable. She continues to have left foot pain. She is being referred to a neurologist in [place] for further evaluation of this problem. This keeps her from walking long distances, which he otherwise would like to do, she says. She has little bit of hoarseness, with a history of allergies this time of year. She has mild heartburn. She has a history of urinary infections, but none recent. She feels a little bit forgetful. Otherwise detailed review of systems was noncontributory  Allergies  Allergen Reactions  . Sulfa Antibiotics   . Codeine Anxiety    Current Outpatient Prescriptions  Medication Sig Dispense Refill  . atorvastatin (LIPITOR) 20 MG tablet Take 20 mg by mouth daily.      . calcium-vitamin D (OSCAL WITH D) 250-125 MG-UNIT per tablet Take 1 tablet by mouth daily.      Marland Kitchen diltiazem (TIAZAC) 360 MG 24 hr capsule Take 360 mg by mouth daily.      Marland Kitchen esomeprazole (NEXIUM) 40 MG capsule Take 40 mg by mouth daily before breakfast.      . fexofenadine ([name]) 180 MG tablet Take 180 mg by mouth daily.      . fish oil-omega-3 fatty acids 1000 MG capsule Take 2 g by mouth daily.      . irbesartan (AVAPRO) 150 MG tablet Take 75 mg by mouth at bedtime.      Marland Kitchen levothyroxine (SYNTHROID, LEVOTHROID) 50 MCG tablet Take 50 mcg by mouth daily.      . metFORMIN (GLUCOPHAGE) 500 MG tablet Take 750 mg by mouth daily.       . metoprolol succinate (TOPROL-XL) 25 MG 24 hr tablet Take 25 mg by mouth 2 times daily at 12 noon and 4 pm.       . Multiple Vitamins-Minerals  (MULTIVITAMIN WITH MINERALS) tablet Take 1 tablet by mouth daily.      Marland Kitchen spironolactone (ALDACTONE) 25 MG tablet Take 37.5 mg by mouth daily.      . vitamin E 1000 UNIT capsule Take 1,000 Units by mouth daily.         Objective:  Filed Vitals:   05/06/11 1107  BP: 137/81  Pulse: 75  Temp: 98.2 F (36.8 C)    BMI: Body mass index is 29.49 kg/(m^2).   ECOG FS: 0  Physical Exam:   Sclerae unicteric  Oropharynx clear  No peripheral adenopathy  Lungs clear -- no rales or rhonchi  Heart regular rate and rhythm  Abdomen benign  MSK no focal spinal tenderness, no peripheral edema  Neuro nonfocal  Breast exam: Status post right mastectomy with no evidence of local recurrence. The left breast is unremarkable.  Lab Results:      Chemistry      Component Value Date/Time   NA 141 09/02/2010 1450   K 4.2 09/02/2010 1450   CL 106 09/02/2010 1450   CO2 27 09/02/2010 1450   BUN 18 09/02/2010 1450   CREATININE 0.91 09/02/2010 1450      Component Value Date/Time  CALCIUM 9.2 09/02/2010 1450   ALKPHOS 62 09/02/2010 1450   AST 16 09/02/2010 1450   ALT 16 09/02/2010 1450   BILITOT 0.3 09/02/2010 1450       Lab Results  Component Value Date   WBC 7.9 09/02/2010   HGB 13.8 09/02/2010   HCT 40.5 09/02/2010   MCV 92.7 09/02/2010   PLT 250 09/02/2010   NEUTROABS 3.9 09/02/2010    Studies/Results:  She just had repeat left ultrasonography at [facility]. 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 For this stage I tumor she received letrozole between March 2007 and May 2012   Plan: Victoria Walker is doing well from a breast cancer point of view, with no evidence of disease recurrence. I am comfortable releasing her to Dr. August Saucer, who  follows her closely for her diabetes, hypertension, and other chronic issues. Moderate knows we will be happy to see her at any point in the future. As of now no further appointments have been made for her here.  Victoria Walker C 05/06/2011

## 2011-05-16 ENCOUNTER — Encounter: Payer: Self-pay | Admitting: Oncology

## 2011-07-31 ENCOUNTER — Other Ambulatory Visit: Payer: Self-pay | Admitting: Internal Medicine

## 2011-07-31 ENCOUNTER — Ambulatory Visit
Admission: RE | Admit: 2011-07-31 | Discharge: 2011-07-31 | Disposition: A | Discharge status: Home / Self Care | Payer: Medicare Other | Source: Ambulatory Visit | Attending: Internal Medicine | Admitting: Internal Medicine

## 2012-01-06 DIAGNOSIS — E079 Disorder of thyroid, unspecified: Secondary | ICD-10-CM | POA: Insufficient documentation

## 2012-01-06 DIAGNOSIS — G629 Polyneuropathy, unspecified: Secondary | ICD-10-CM | POA: Insufficient documentation

## 2012-01-06 DIAGNOSIS — H209 Unspecified iridocyclitis: Secondary | ICD-10-CM | POA: Insufficient documentation

## 2012-01-06 DIAGNOSIS — I10 Essential (primary) hypertension: Secondary | ICD-10-CM | POA: Insufficient documentation

## 2012-03-02 ENCOUNTER — Other Ambulatory Visit: Payer: Self-pay | Admitting: Urology

## 2012-03-02 DIAGNOSIS — E119 Type 2 diabetes mellitus without complications: Secondary | ICD-10-CM | POA: Insufficient documentation

## 2012-04-27 ENCOUNTER — Encounter (HOSPITAL_COMMUNITY): Payer: Self-pay | Admitting: Pharmacy Technician

## 2012-04-28 ENCOUNTER — Emergency Department (HOSPITAL_BASED_OUTPATIENT_CLINIC_OR_DEPARTMENT_OTHER)
Admission: EM | Admit: 2012-04-28 | Discharge: 2012-04-28 | Disposition: A | Discharge status: Home / Self Care | Payer: Medicare Other | Attending: Emergency Medicine | Admitting: Emergency Medicine

## 2012-04-28 ENCOUNTER — Encounter (HOSPITAL_BASED_OUTPATIENT_CLINIC_OR_DEPARTMENT_OTHER): Payer: Self-pay | Admitting: Family Medicine

## 2012-04-28 DIAGNOSIS — E1142 Type 2 diabetes mellitus with diabetic polyneuropathy: Secondary | ICD-10-CM | POA: Insufficient documentation

## 2012-04-28 DIAGNOSIS — E7800 Pure hypercholesterolemia, unspecified: Secondary | ICD-10-CM | POA: Insufficient documentation

## 2012-04-28 DIAGNOSIS — Z859 Personal history of malignant neoplasm, unspecified: Secondary | ICD-10-CM | POA: Insufficient documentation

## 2012-04-28 DIAGNOSIS — N898 Other specified noninflammatory disorders of vagina: Secondary | ICD-10-CM

## 2012-04-28 DIAGNOSIS — Z90710 Acquired absence of both cervix and uterus: Secondary | ICD-10-CM | POA: Insufficient documentation

## 2012-04-28 DIAGNOSIS — E1149 Type 2 diabetes mellitus with other diabetic neurological complication: Secondary | ICD-10-CM | POA: Insufficient documentation

## 2012-04-28 DIAGNOSIS — Z79899 Other long term (current) drug therapy: Secondary | ICD-10-CM | POA: Insufficient documentation

## 2012-04-28 DIAGNOSIS — N899 Noninflammatory disorder of vagina, unspecified: Secondary | ICD-10-CM | POA: Insufficient documentation

## 2012-04-28 DIAGNOSIS — I10 Essential (primary) hypertension: Secondary | ICD-10-CM | POA: Insufficient documentation

## 2012-04-28 HISTORY — DX: Pure hypercholesterolemia, unspecified: E78.00

## 2012-04-28 HISTORY — DX: Polyneuropathy, unspecified: G62.9

## 2012-04-28 HISTORY — DX: Essential (primary) hypertension: I10

## 2012-04-28 LAB — WET PREP, GENITAL
Clue Cells Wet Prep HPF POC: NONE SEEN
Trich, Wet Prep: NONE SEEN
Yeast Wet Prep HPF POC: NONE SEEN

## 2012-04-28 LAB — URINE MICROSCOPIC-ADD ON

## 2012-04-28 LAB — URINALYSIS, ROUTINE W REFLEX MICROSCOPIC
Bilirubin Urine: NEGATIVE
Glucose, UA: NEGATIVE mg/dL
Hgb urine dipstick: NEGATIVE
Ketones, ur: NEGATIVE mg/dL
Nitrite: NEGATIVE
Protein, ur: NEGATIVE mg/dL
Specific Gravity, Urine: 1.013 (ref 1.005–1.030)
Urobilinogen, UA: 0.2 mg/dL (ref 0.0–1.0)
pH: 6 (ref 5.0–8.0)

## 2012-04-28 NOTE — ED Provider Notes (Signed)
History     CSN: [id number]  Arrival date & time 04/28/12  1232   First MD Initiated Contact with Patient 04/28/12 1244      Chief Complaint  Patient presents with  . Vaginal Itching    (Consider location/radiation/quality/duration/timing/severity/associated sxs/prior treatment) HPI Comments: Pt state that she is having vaginal itching and discharge:pt states that she is supposed to have a bladder tact  At the end of the month  Patient is a 62 y.o. female presenting with vaginal itching. The history is provided by the patient. No language interpreter was used.  Vaginal Itching This is a new problem. The current episode started in the past 7 days. The problem occurs constantly. The problem has been unchanged. Pertinent negatives include no fever. Nothing aggravates the symptoms. She has tried nothing for the symptoms.    Past Medical History  Diagnosis Date  . Diabetes mellitus without complication   . Hypertension   . Neuropathy   . Cancer   . High cholesterol     Past Surgical History  Procedure Date  . Abdominal hysterectomy     No family history on file.  History  Substance Use Topics  . Smoking status: Never Smoker   . Smokeless tobacco: Not on file  . Alcohol Use: No    OB History    Grav Para Term Preterm Abortions TAB SAB Ect Mult Living                  Review of Systems  Constitutional: Negative for fever.  Respiratory: Negative.   Cardiovascular: Negative.   Genitourinary: Negative for vaginal pain.    Allergies  Penicillins; Sulfa antibiotics; and Codeine  Home Medications   Current Outpatient Rx  Name  Route  Sig  Dispense  Refill  . ATORVASTATIN CALCIUM 20 MG PO TABS   Oral   Take 20 mg by mouth at bedtime.          Marland Kitchen CALCIUM CARBONATE-VITAMIN D 250-125 MG-UNIT PO TABS   Oral   Take 1 tablet by mouth daily.         Marland Kitchen CARBOXYMETHYLCELLUL-GLYCERIN 0.5-0.9 % OP SOLN   Ophthalmic   Apply 1 drop to eye daily.         .  CYCLOSPORINE 0.05 % OP EMUL   Both Eyes   Place 1 drop into both eyes 2 (two) times daily.         Marland Kitchen DILTIAZEM HCL ER BEADS 360 MG PO CP24   Oral   Take 360 mg by mouth every morning.          Marland Kitchen ESOMEPRAZOLE MAGNESIUM 40 MG PO CPDR   Oral   Take 40 mg by mouth daily before breakfast.         . FEXOFENADINE HCL 180 MG PO TABS   Oral   Take 180 mg by mouth every morning.          Marland Kitchen OMEGA-3 FATTY ACIDS 1000 MG PO CAPS   Oral   Take 1 g by mouth daily.          Marland Kitchen GABAPENTIN 300 MG PO CAPS   Oral   Take 300 mg by mouth at bedtime.         . IRBESARTAN 150 MG PO TABS   Oral   Take 75 mg by mouth at bedtime.         Marland Kitchen LEVOTHYROXINE SODIUM 50 MCG PO TABS   Oral   Take 50 mcg by  mouth daily before breakfast.          . LOTEPREDNOL ETABONATE 0.5 % OP SUSP   Both Eyes   Place 1 drop into both eyes 4 (four) times daily.         Marland Kitchen METFORMIN HCL 500 MG PO TABS   Oral   Take 750 mg by mouth daily at 12 noon.          Marland Kitchen METOPROLOL SUCCINATE ER 25 MG PO TB24   Oral   Take 25 mg by mouth 2 times daily at 12 noon and 4 pm.          . MULTI-VITAMIN/MINERALS PO TABS   Oral   Take 1 tablet by mouth daily.         Marland Kitchen SPIRONOLACTONE 25 MG PO TABS   Oral   Take 37.5 mg by mouth every morning.          Marland Kitchen VITAMIN E 1000 UNITS PO CAPS   Oral   Take 1,000 Units by mouth daily.           BP 118/59  Pulse 77  Temp 97.8 F (36.6 C) (Oral)  Resp 18  Ht 5\' 5"  (1.651 m)  Wt 175 lb (79.379 kg)  BMI 29.12 kg/m2  SpO2 97%  Physical Exam  Nursing note and vitals reviewed. Constitutional: She is oriented to person, place, and time. She appears well-developed and well-nourished.  Cardiovascular: Normal rate and regular rhythm.   Pulmonary/Chest: Effort normal and breath sounds normal.  Abdominal: Soft. Bowel sounds are normal. There is no tenderness.  Genitourinary:       Thin white discharge noted  Musculoskeletal: Normal range of motion.  Neurological:  She is alert and oriented to person, place, and time.  Skin: Skin is warm and dry.    ED Course  Procedures (including critical care time)  Labs Reviewed  URINALYSIS, ROUTINE W REFLEX MICROSCOPIC - Abnormal; Notable for the following:    Leukocytes, UA SMALL (*)     All other components within normal limits  WET PREP, GENITAL - Abnormal; Notable for the following:    WBC, Wet Prep HPF POC MODERATE (*)     All other components within normal limits  URINE MICROSCOPIC-ADD ON - Abnormal; Notable for the following:    Squamous Epithelial / LPF FEW (*)     Bacteria, UA FEW (*)     All other components within normal limits  GC/CHLAMYDIA PROBE AMP   No results found.   1. Vaginal irritation       MDM  No definite infection:pt is okay to follow up as needed:urine sent of culture       Teressa Lower, NP 04/28/12 1413

## 2012-04-28 NOTE — ED Provider Notes (Signed)
Medical screening examination/treatment/procedure(s) were performed by non-physician practitioner and as supervising physician I was immediately available for consultation/collaboration.   Dione Booze, MD 04/28/12 [phone]

## 2012-04-28 NOTE — ED Notes (Signed)
Pt c/o vaginal itching and discharge. Pt sts she needs a pap smear and is scheduled for surgery for prolapsed bladder later this month. Pt also c/o dysuria.

## 2012-04-29 LAB — GC/CHLAMYDIA PROBE AMP
CT Probe RNA: NEGATIVE
GC Probe RNA: NEGATIVE

## 2012-04-30 ENCOUNTER — Other Ambulatory Visit (HOSPITAL_COMMUNITY): Payer: Self-pay | Admitting: Urology

## 2012-04-30 NOTE — Patient Instructions (Addendum)
20 Victoria Walker  04/30/2012   Your procedure is scheduled on: 05-11-2012  Report to [facility] at 0515 AM.  Call this number if you have problems the morning of surgery [phone]   Remember:fleetes enema night before surgery   Do not eat food or drink liquids :After Midnight.     Take these medicines the morning of surgery with A SIP OF WATER: diltiazem, nexium, allegra, synthroid, toprol-XL   Do not wear jewelry, make-up or nail polish.  Do not wear lotions, powders, or perfumes. You may wear deodorant.   Men may shave face and neck.  Do not bring valuables to the hospital.  Contacts, dentures or bridgework may not be worn into surgery.  Leave suitcase in the car. After surgery it may be brought to your room.  For patients admitted to the hospital, checkout time is 11:00 AM the day of discharge.     Please read over the following fact sheets that you were given: MRSA Information,  blood fact sheet  Call Birdie Sons RN pre op nurse if needed [phone]    FAILURE TO FOLLOW THESE INSTRUCTIONS MAY RESULT IN THE CANCELLATION OF YOUR SURGERY. PATIENT SIGNATURE___________________________________________

## 2012-05-03 ENCOUNTER — Encounter (HOSPITAL_COMMUNITY): Payer: Self-pay

## 2012-05-03 ENCOUNTER — Encounter (HOSPITAL_COMMUNITY)
Admission: RE | Admit: 2012-05-03 | Discharge: 2012-05-03 | Disposition: A | Discharge status: Home / Self Care | Payer: Medicare Other | Source: Ambulatory Visit | Attending: Urology | Admitting: Urology

## 2012-05-03 HISTORY — DX: Hypothyroidism, unspecified: E03.9

## 2012-05-03 HISTORY — DX: Unspecified osteoarthritis, unspecified site: M19.90

## 2012-05-03 HISTORY — DX: Gastro-esophageal reflux disease without esophagitis: K21.9

## 2012-05-03 LAB — CBC
HCT: 40.7 % (ref 36.0–46.0)
Hemoglobin: 13.8 g/dL (ref 12.0–15.0)
MCH: 31.9 pg (ref 26.0–34.0)
MCHC: 33.9 g/dL (ref 30.0–36.0)
MCV: 94.2 fL (ref 78.0–100.0)
Platelets: 262 10*3/uL (ref 150–400)
RBC: 4.32 MIL/uL (ref 3.87–5.11)
RDW: 12.7 % (ref 11.5–15.5)
WBC: 6.5 10*3/uL (ref 4.0–10.5)

## 2012-05-03 LAB — ABO/RH: ABO/RH(D): O POS

## 2012-05-03 LAB — BASIC METABOLIC PANEL
BUN: 14 mg/dL (ref 6–23)
CO2: 28 mEq/L (ref 19–32)
Calcium: 9.9 mg/dL (ref 8.4–10.5)
Chloride: 104 mEq/L (ref 96–112)
Creatinine, Ser: 0.67 mg/dL (ref 0.50–1.10)
GFR calc Af Amer: >90 mL/min (ref >90)
GFR calc non Af Amer: >90 mL/min (ref >90)
Glucose, Bld: 53 mg/dL — ABNORMAL LOW (ref 70–99)
Potassium: 4.2 mEq/L (ref 3.5–5.1)
Sodium: 140 mEq/L (ref 135–145)

## 2012-05-03 LAB — PROTIME-INR
INR: 0.97 (ref 0.00–1.49)
Prothrombin Time: 12.8 seconds (ref 11.6–15.2)

## 2012-05-03 LAB — SURGICAL PCR SCREEN
MRSA, PCR: NEGATIVE
Staphylococcus aureus: NEGATIVE

## 2012-05-03 LAB — APTT: aPTT: 31 seconds (ref 24–37)

## 2012-05-03 NOTE — Progress Notes (Addendum)
EKG 07/10/09 on chart- will repeat, office note 07/10/09 Dr. Mayford Knife on chart, stress test 07/19/09 on chart, ECHO 07/19/09 on chart, chest x-ray 07/31/11 on EPIC

## 2012-05-10 MED ORDER — GENTAMICIN SULFATE 40 MG/ML IJ SOLN
330.0000 mg | INTRAVENOUS | Status: DC
  Filled 2012-05-10: qty 8.25

## 2012-05-10 NOTE — H&P (Signed)
History of Present Illness   Victoria Walker has pelvic organ prolapse. She has urgency and may have a little bit of urge incontinence. She has urethral dilations for poor flow by Dr. Brunilda Payor. She has long-term vaginal dryness and intermittent dyspareunia. She has a history of breast cancer and cannot take Estrace. She can feel and see vaginal bulging. She has had a hysterectomy. She was here to discuss urodynamics. She has a grade 3 cystocele. Vaginal cuff descended 3-4 cm. She had a mild posterior defect. I thought if she ever had surgery, she likely best be served by a transvaginal vault suspension with cystocele repair and graft and an intraoperative decision whether or not she would require a rectocele repair.   She did not void and was catheterized for 100 mL. Her initial residual on the first visit was 35 mL. Her maximum capacity is 485 mL. Her bladder was stable. She had mild leakage with a Valsalva pressure of 68 cm of water. The leak-point pressure was 71 cm of water and it was mild with the prolapse reduced. During voluntary voiding, she voided 483 mL with a maximum flow of 23 mL per second. Maximum voiding pressure was 25 cm of water. She emptied efficiently. EMG activity mildly increased during the voiding phase. Fluoroscopically she had a large cystocele. Her bladder was hypersensitive. The details of the urodynamics are signed and dictated on the urodynamic sheet.   Review of Systems: No change in bowel or neurologic systems.    Past Medical History Problems  1. History of  Anxiety (Symptom) 300.00 2. History of  Arthritis V13.4 3. History of  Breast Cancer V10.3 4. History of  Depression 311 5. History of  Diabetes Mellitus 250.00 6. History of  Functional Murmur 785.2 7. History of  Heartburn 787.1 8. History of  Hypercholesterolemia 272.0 9. History of  Hypertension 401.9  Surgical History Problems  1. History of  Breast Surgery Mastectomy 2. History of  Hysterectomy 3. History  of  Thyroid Surgery  Current Meds 1. [name] 180 MG TABS; Therapy: (Recorded:03Dec2007) to 2. Avapro 75 MG Oral Tablet; Therapy: (Recorded:03Dec2007) to 3. Diltiazem HCl ER Coated Beads 360 MG Oral Capsule Extended Release 24 Hour; Therapy:  21Feb2013 to 4. Gabapentin 300 MG Oral Capsule; Therapy: 03Aug2013 to 5. Levothyroxine Sodium 50 MCG Oral Tablet; Therapy: 21Feb2013 to 6. Lipitor 20 MG Oral Tablet; Therapy: (Recorded:03Dec2007) to 7. Lotemax 0.5 % Ophthalmic Suspension; Therapy: 28Jun2013 to 8. MetFORMIN HCl 500 MG Oral Tablet; Therapy: (Recorded:03Dec2007) to 9. Metoprolol Succinate ER 25 MG Oral Tablet Extended Release 24 Hour; Therapy: 21Apr2012 to 10. Nasonex 50 MCG/ACT Nasal Suspension; Therapy: 28Mar2013 to 11. NexIUM 40 MG Oral Packet; Therapy: (Recorded:14Mar2012) to 12. Omega 3 CAPS; Therapy: (Recorded:17Sep2012) to 13. Oscal 500/200 D-3 TABS; Therapy: (Recorded:03Oct2013) to 14. Spironolactone 25 MG Oral Tablet; Therapy: (Recorded:08Jun2009) to 15. Vitamin E CAPS; Therapy: (Recorded:17Sep2012) to  Allergies Medication  1. Nitrofurantoin Macrocrystal CAPS 2. Codeine Derivatives 3. Penicillins 4. Sulfa Drugs  Family History Problems  1. Family history of  Death In The Family Father 18 2. Family history of  Death In The Family Mother 12 3. Sororal history of  Diabetes Mellitus V18.0 4. Fraternal history of  Diabetes Mellitus V18.0 5. Family history of  Family Health Status Number Of Children 1 son; 2 daughters 6. Sororal history of  Hypertension 7. Fraternal history of  Hypertension 8. Paternal history of  Lung Cancer V16.1 9. Maternal history of  Nephrolithiasis 10. Paternal history of  Prostate Cancer  Z61.09  Social History Problems  1. Marital History - Currently Married 2. Never A Smoker 3. 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 History of  Alcohol Use 5. History of  Caffeine Use 6. History of  Tobacco Use  Assessment Assessed  1.  Urinary Frequency 2. Cystocele 596.89  Plan Cystocele (596.89)  1. Follow-up PRN Office  Follow-up  Requested for: 11Nov2013  Discussion/Summary   I drew Mr. and Mrs. Minnie a picture. I went over a transvaginal vault suspension, cystocele repair and graft and possible rectocele repair. I talked about a pessary, but she has already failed one. I talked about watchful waiting.   I drew her a picture and we talked about prolapse surgery in detail. Pros, cons, general surgical and anesthetic risks, and other options including behavioral therapy, pessaries, and watchful waiting were discussed. She understands that prolapse repairs are successful in 80-85% of cases for prolapse symptoms and can recur anteriorly, posteriorly, and/or apically. She understands that in most cases I use a graft and general risks were discussed. Surgical risks were described but not limited to the discussion of injury to neighboring structures including the bowel (with possible life-threatening sepsis and colostomy), bladder, urethra, vagina (all resulting in further surgery), and ureter (resulting in re-implantation). We talked about injury to nerves/soft tissue leading to debilitating and intractable pelvic, abdominal, and lower extremity pain syndromes and neuropathies. The risks of buttock pain, intractable dyspareunia, and vaginal narrowing and shortening with sequelae were discussed. Bleeding risks, transfusion rates, and infection were discussed. The risk of persistent, de novo, or worsening bladder and/or bowel incontinence/dysfunction was discussed. The need for CIC was described as well the usual post-operative course. The patient understands that she might not reach her treatment goal and that she might be worse following surgery.  Because of her poor flow and dilations and her overall clinical picture, I drew her a picture regarding a sling, but I actually recommended not to have one. She understands that there may  be a 5% chance that long term she may need a sling if she unmasks significant stress incontinence.   TV mesh issues were discussed.  Phone number given. She will call me if she would like to proceed.  After a thorough review of the management options for the patient's condition the patient  elected to proceed with surgical therapy as noted above. We have discussed the potential benefits and risks of the procedure, side effects of the proposed treatment, the likelihood of the patient achieving the goals of the procedure, and any potential problems that might occur during the procedure or recuperation. Informed consent has been obtained.

## 2012-05-11 ENCOUNTER — Observation Stay (HOSPITAL_COMMUNITY)
Admission: RE | Admit: 2012-05-11 | Discharge: 2012-05-12 | Disposition: A | Discharge status: Home / Self Care | Payer: Medicare Other | Source: Ambulatory Visit | Attending: Urology | Admitting: Urology

## 2012-05-11 ENCOUNTER — Encounter (HOSPITAL_COMMUNITY)
Admission: RE | Disposition: A | Discharge status: Home / Self Care | Payer: Self-pay | Source: Ambulatory Visit | Attending: Urology

## 2012-05-11 ENCOUNTER — Ambulatory Visit (HOSPITAL_COMMUNITY): Payer: Medicare Other | Admitting: Anesthesiology

## 2012-05-11 ENCOUNTER — Encounter (HOSPITAL_COMMUNITY): Payer: Self-pay | Admitting: Anesthesiology

## 2012-05-11 ENCOUNTER — Encounter (HOSPITAL_COMMUNITY): Payer: Self-pay

## 2012-05-11 DIAGNOSIS — Z01812 Encounter for preprocedural laboratory examination: Secondary | ICD-10-CM | POA: Insufficient documentation

## 2012-05-11 DIAGNOSIS — E119 Type 2 diabetes mellitus without complications: Secondary | ICD-10-CM | POA: Insufficient documentation

## 2012-05-11 DIAGNOSIS — N993 Prolapse of vaginal vault after hysterectomy: Principal | ICD-10-CM | POA: Insufficient documentation

## 2012-05-11 DIAGNOSIS — Z01810 Encounter for preprocedural cardiovascular examination: Secondary | ICD-10-CM | POA: Insufficient documentation

## 2012-05-11 DIAGNOSIS — Z79899 Other long term (current) drug therapy: Secondary | ICD-10-CM | POA: Insufficient documentation

## 2012-05-11 DIAGNOSIS — E7800 Pure hypercholesterolemia, unspecified: Secondary | ICD-10-CM | POA: Insufficient documentation

## 2012-05-11 DIAGNOSIS — Z23 Encounter for immunization: Secondary | ICD-10-CM | POA: Insufficient documentation

## 2012-05-11 DIAGNOSIS — I10 Essential (primary) hypertension: Secondary | ICD-10-CM | POA: Insufficient documentation

## 2012-05-11 DIAGNOSIS — R3915 Urgency of urination: Secondary | ICD-10-CM | POA: Insufficient documentation

## 2012-05-11 HISTORY — PX: CYSTOSCOPY WITH URETHRAL DILATATION: SHX5125

## 2012-05-11 HISTORY — PX: VAGINAL PROLAPSE REPAIR: SHX830

## 2012-05-11 HISTORY — PX: ANTERIOR AND POSTERIOR REPAIR: SHX5121

## 2012-05-11 LAB — TYPE AND SCREEN
ABO/RH(D): O POS
Antibody Screen: NEGATIVE

## 2012-05-11 LAB — GLUCOSE, CAPILLARY
Glucose-Capillary: 120 mg/dL — ABNORMAL HIGH (ref 70–99)
Glucose-Capillary: 164 mg/dL — ABNORMAL HIGH (ref 70–99)
Glucose-Capillary: 118 mg/dL — ABNORMAL HIGH (ref 70–99)
Glucose-Capillary: 128 mg/dL — ABNORMAL HIGH (ref 70–99)

## 2012-05-11 LAB — HEMOGLOBIN AND HEMATOCRIT, BLOOD
HCT: 42.3 % (ref 36.0–46.0)
Hemoglobin: 14.4 g/dL (ref 12.0–15.0)

## 2012-05-11 SURGERY — ANTERIOR (CYSTOCELE) AND POSTERIOR REPAIR (RECTOCELE)
Anesthesia: General | Wound class: Clean Contaminated

## 2012-05-11 MED ORDER — GABAPENTIN 300 MG PO CAPS
300.0000 mg | ORAL_CAPSULE | Freq: Every day | ORAL | Status: DC
  Administered 2012-05-11: 300 mg via ORAL
  Filled 2012-05-11 (×2): qty 1

## 2012-05-11 MED ORDER — PNEUMOCOCCAL VAC POLYVALENT 25 MCG/0.5ML IJ INJ
0.5000 mL | INJECTION | INTRAMUSCULAR | Status: AC
  Administered 2012-05-12: 0.5 mL via INTRAMUSCULAR
  Filled 2012-05-11 (×2): qty 0.5

## 2012-05-11 MED ORDER — ACETAMINOPHEN 10 MG/ML IV SOLN
INTRAVENOUS | Status: DC | PRN
  Administered 2012-05-11: 1000 mg via INTRAVENOUS

## 2012-05-11 MED ORDER — INDIGOTINDISULFONATE SODIUM 8 MG/ML IJ SOLN
INTRAMUSCULAR | Status: DC | PRN
  Administered 2012-05-11 (×2): 5 mL via INTRAVENOUS

## 2012-05-11 MED ORDER — ATORVASTATIN CALCIUM 20 MG PO TABS
20.0000 mg | ORAL_TABLET | Freq: Every day | ORAL | Status: DC
  Administered 2012-05-11: 20 mg via ORAL
  Filled 2012-05-11 (×2): qty 1

## 2012-05-11 MED ORDER — ESTRADIOL 0.1 MG/GM VA CREA
TOPICAL_CREAM | VAGINAL | Status: AC
  Filled 2012-05-11: qty 85

## 2012-05-11 MED ORDER — PANTOPRAZOLE SODIUM 40 MG PO TBEC
80.0000 mg | DELAYED_RELEASE_TABLET | Freq: Every day | ORAL | Status: DC
  Administered 2012-05-12: 80 mg via ORAL
  Filled 2012-05-11: qty 2

## 2012-05-11 MED ORDER — CISATRACURIUM BESYLATE (PF) 10 MG/5ML IV SOLN
INTRAVENOUS | Status: DC | PRN
  Administered 2012-05-11: 10 mg via INTRAVENOUS

## 2012-05-11 MED ORDER — LIDOCAINE-EPINEPHRINE 1 %-1:100000 IJ SOLN
INTRAMUSCULAR | Status: AC
  Filled 2012-05-11: qty 1

## 2012-05-11 MED ORDER — ACETAMINOPHEN 10 MG/ML IV SOLN
INTRAVENOUS | Status: AC
  Filled 2012-05-11: qty 100

## 2012-05-11 MED ORDER — FLEET ENEMA 7-19 GM/118ML RE ENEM: 1.0000 | ENEMA | Freq: Once | RECTAL | Status: DC

## 2012-05-11 MED ORDER — GLYCOPYRROLATE 0.2 MG/ML IJ SOLN
INTRAMUSCULAR | Status: DC | PRN
  Administered 2012-05-11: 0.3 mg via INTRAVENOUS

## 2012-05-11 MED ORDER — LIDOCAINE-EPINEPHRINE (PF) 1 %-1:200000 IJ SOLN
INTRAMUSCULAR | Status: DC | PRN
  Administered 2012-05-11: 10 mL
  Administered 2012-05-11: 30 mL

## 2012-05-11 MED ORDER — MENTHOL 3 MG MT LOZG
1.0000 | LOZENGE | OROMUCOSAL | Status: DC | PRN
  Filled 2012-05-11: qty 9

## 2012-05-11 MED ORDER — DILTIAZEM HCL ER BEADS 240 MG PO CP24
360.0000 mg | ORAL_CAPSULE | Freq: Every morning | ORAL | Status: DC
  Administered 2012-05-12: 360 mg via ORAL
  Filled 2012-05-11: qty 1

## 2012-05-11 MED ORDER — INSULIN ASPART 100 UNIT/ML ~~LOC~~ SOLN
0.0000 [IU] | Freq: Three times a day (TID) | SUBCUTANEOUS | Status: DC
  Administered 2012-05-11: 3 [IU] via SUBCUTANEOUS
  Administered 2012-05-12: 2 [IU] via SUBCUTANEOUS

## 2012-05-11 MED ORDER — 0.9 % SODIUM CHLORIDE (POUR BTL) OPTIME
TOPICAL | Status: DC | PRN
  Administered 2012-05-11: 200 mL

## 2012-05-11 MED ORDER — PHENOL 1.4 % MT LIQD
1.0000 | OROMUCOSAL | Status: DC | PRN
  Filled 2012-05-11: qty 177

## 2012-05-11 MED ORDER — DEXAMETHASONE SODIUM PHOSPHATE 10 MG/ML IJ SOLN
INTRAMUSCULAR | Status: DC | PRN
  Administered 2012-05-11: 10 mg via INTRAVENOUS

## 2012-05-11 MED ORDER — METHYLENE BLUE 1 % INJ SOLN
INTRAMUSCULAR | Status: DC | PRN
  Administered 2012-05-11: 5 mL via INTRAVENOUS

## 2012-05-11 MED ORDER — SODIUM CHLORIDE 0.45 % IV SOLN
INTRAVENOUS | Status: DC
  Administered 2012-05-11 (×2): via INTRAVENOUS

## 2012-05-11 MED ORDER — IOHEXOL 300 MG/ML  SOLN
INTRAMUSCULAR | Status: AC
  Filled 2012-05-11: qty 1

## 2012-05-11 MED ORDER — LOTEPREDNOL ETABONATE 0.5 % OP SUSP
1.0000 [drp] | Freq: Every day | OPHTHALMIC | Status: DC
  Administered 2012-05-11: 1 [drp] via OPHTHALMIC
  Filled 2012-05-11: qty 5

## 2012-05-11 MED ORDER — METOPROLOL SUCCINATE ER 25 MG PO TB24
25.0000 mg | ORAL_TABLET | Freq: Two times a day (BID) | ORAL | Status: DC
  Administered 2012-05-12: 25 mg via ORAL
  Filled 2012-05-11 (×3): qty 1

## 2012-05-11 MED ORDER — FENTANYL CITRATE 0.05 MG/ML IJ SOLN
INTRAMUSCULAR | Status: DC | PRN
  Administered 2012-05-11 (×2): 50 ug via INTRAVENOUS
  Administered 2012-05-11: 100 ug via INTRAVENOUS

## 2012-05-11 MED ORDER — STERILE WATER FOR IRRIGATION IR SOLN
Status: DC | PRN
  Administered 2012-05-11: 3000 mL

## 2012-05-11 MED ORDER — INSULIN ASPART 100 UNIT/ML ~~LOC~~ SOLN: 0.0000 [IU] | Freq: Every day | SUBCUTANEOUS | Status: DC

## 2012-05-11 MED ORDER — MORPHINE SULFATE 2 MG/ML IJ SOLN
2.0000 mg | INTRAMUSCULAR | Status: DC | PRN
  Administered 2012-05-11: 2 mg via INTRAVENOUS
  Administered 2012-05-11: 3 mg via INTRAVENOUS
  Administered 2012-05-11 – 2012-05-12 (×4): 2 mg via INTRAVENOUS
  Filled 2012-05-11 (×4): qty 1
  Filled 2012-05-11: qty 2
  Filled 2012-05-11: qty 1

## 2012-05-11 MED ORDER — LEVOTHYROXINE SODIUM 50 MCG PO TABS
50.0000 ug | ORAL_TABLET | Freq: Every day | ORAL | Status: DC
  Administered 2012-05-12: 50 ug via ORAL
  Filled 2012-05-11 (×2): qty 1

## 2012-05-11 MED ORDER — FENTANYL CITRATE 0.05 MG/ML IJ SOLN: 25.0000 ug | INTRAMUSCULAR | Status: DC | PRN

## 2012-05-11 MED ORDER — PROMETHAZINE HCL 25 MG/ML IJ SOLN: 6.2500 mg | INTRAMUSCULAR | Status: DC | PRN

## 2012-05-11 MED ORDER — DEXTROSE 5 % IV SOLN
330.0000 mg | INTRAVENOUS | Status: AC
  Administered 2012-05-11: 330 mg via INTRAVENOUS
  Filled 2012-05-11 (×2): qty 8.25

## 2012-05-11 MED ORDER — CYCLOSPORINE 0.05 % OP EMUL
1.0000 [drp] | Freq: Two times a day (BID) | OPHTHALMIC | Status: DC
  Administered 2012-05-11 – 2012-05-12 (×2): 1 [drp] via OPHTHALMIC
  Filled 2012-05-11 (×5): qty 1

## 2012-05-11 MED ORDER — IRBESARTAN 75 MG PO TABS
75.0000 mg | ORAL_TABLET | Freq: Every day | ORAL | Status: DC
  Administered 2012-05-11: 75 mg via ORAL
  Filled 2012-05-11 (×2): qty 1

## 2012-05-11 MED ORDER — ESTRADIOL 0.1 MG/GM VA CREA
TOPICAL_CREAM | VAGINAL | Status: DC | PRN
  Administered 2012-05-11: 1 via VAGINAL

## 2012-05-11 MED ORDER — ACETAMINOPHEN 10 MG/ML IV SOLN
1000.0000 mg | Freq: Four times a day (QID) | INTRAVENOUS | Status: DC
  Administered 2012-05-11 – 2012-05-12 (×3): 1000 mg via INTRAVENOUS
  Filled 2012-05-11 (×4): qty 100

## 2012-05-11 MED ORDER — SPIRONOLACTONE 25 MG PO TABS
37.5000 mg | ORAL_TABLET | Freq: Every morning | ORAL | Status: DC
  Administered 2012-05-11 – 2012-05-12 (×2): 37.5 mg via ORAL
  Filled 2012-05-11 (×2): qty 1

## 2012-05-11 MED ORDER — LACTATED RINGERS IV SOLN: INTRAVENOUS | Status: DC

## 2012-05-11 MED ORDER — SODIUM CHLORIDE 0.9 % IR SOLN
Status: DC | PRN
  Administered 2012-05-11: 08:00:00

## 2012-05-11 MED ORDER — LACTATED RINGERS IV SOLN
INTRAVENOUS | Status: DC | PRN
  Administered 2012-05-11 (×2): via INTRAVENOUS

## 2012-05-11 MED ORDER — METFORMIN HCL 500 MG PO TABS
750.0000 mg | ORAL_TABLET | Freq: Every day | ORAL | Status: DC
  Administered 2012-05-11 – 2012-05-12 (×2): 750 mg via ORAL
  Filled 2012-05-11 (×2): qty 2

## 2012-05-11 MED ORDER — INDIGOTINDISULFONATE SODIUM 8 MG/ML IJ SOLN
INTRAMUSCULAR | Status: AC
  Filled 2012-05-11: qty 5

## 2012-05-11 MED ORDER — METHYLENE BLUE 1 % INJ SOLN
INTRAMUSCULAR | Status: AC
  Filled 2012-05-11: qty 10

## 2012-05-11 MED ORDER — PROPOFOL 10 MG/ML IV BOLUS
INTRAVENOUS | Status: DC | PRN
  Administered 2012-05-11: 150 mg via INTRAVENOUS

## 2012-05-11 MED ORDER — HYDROCODONE-ACETAMINOPHEN 5-325 MG PO TABS: 1.0000 | ORAL_TABLET | ORAL | Status: DC | PRN

## 2012-05-11 MED ORDER — MIDAZOLAM HCL 5 MG/5ML IJ SOLN
INTRAMUSCULAR | Status: DC | PRN
  Administered 2012-05-11: 2 mg via INTRAVENOUS

## 2012-05-11 SURGICAL SUPPLY — 46 items
BAG URINE DRAINAGE (UROLOGICAL SUPPLIES) ×3 IMPLANT
BLADE HEX COATED 2.75 (ELECTRODE) ×3 IMPLANT
BLADE SURG 15 STRL LF DISP TIS (BLADE) ×2 IMPLANT
BLADE SURG 15 STRL SS (BLADE) ×1
CATH FOLEY 2WAY SLVR  5CC 14FR (CATHETERS) ×1
CATH FOLEY 2WAY SLVR 5CC 14FR (CATHETERS) ×2 IMPLANT
CATH INTERMIT  6FR 70CM (CATHETERS) ×3 IMPLANT
CLOTH BEACON ORANGE TIMEOUT ST (SAFETY) ×3 IMPLANT
COVER SURGICAL LIGHT HANDLE (MISCELLANEOUS) ×3 IMPLANT
DECANTER SPIKE VIAL GLASS SM (MISCELLANEOUS) ×6 IMPLANT
DEVICE CAPIO SLIM SINGLE (INSTRUMENTS) ×3 IMPLANT
DRAIN PENROSE 18X1/4 LTX STRL (WOUND CARE) ×3 IMPLANT
DRAPE CAMERA CLOSED 9X96 (DRAPES) ×3 IMPLANT
DRSG TEGADERM 6X8 (GAUZE/BANDAGES/DRESSINGS) ×6 IMPLANT
GAUZE PACKING 2X5 YD STERILE (GAUZE/BANDAGES/DRESSINGS) ×3 IMPLANT
GAUZE SPONGE 4X4 16PLY XRAY LF (GAUZE/BANDAGES/DRESSINGS) ×9 IMPLANT
GLOVE BIOGEL M 6.5 STRL (GLOVE) ×3 IMPLANT
GLOVE BIOGEL M STRL SZ7.5 (GLOVE) ×6 IMPLANT
GLOVE ECLIPSE 8.0 STRL XLNG CF (GLOVE) ×6 IMPLANT
GOWN STRL REIN XL XLG (GOWN DISPOSABLE) ×9 IMPLANT
GUIDEWIRE STR DUAL SENSOR (WIRE) ×3 IMPLANT
HOLDER FOLEY CATH W/STRAP (MISCELLANEOUS) ×3 IMPLANT
KIT BASIN OR (CUSTOM PROCEDURE TRAY) ×3 IMPLANT
NEEDLE HYPO 22GX1.5 SAFETY (NEEDLE) ×3 IMPLANT
NEEDLE MAYO 6 CRC TAPER PT (NEEDLE) ×3 IMPLANT
PACK CYSTO (CUSTOM PROCEDURE TRAY) ×3 IMPLANT
PENCIL BUTTON HOLSTER BLD 10FT (ELECTRODE) ×3 IMPLANT
PLUG CATH AND CAP STER (CATHETERS) ×3 IMPLANT
RETRACTOR STAY HOOK 5MM (MISCELLANEOUS) ×3 IMPLANT
SHEET LAVH (DRAPES) ×3 IMPLANT
SUT CAPIO ETHIBPND (SUTURE) ×6 IMPLANT
SUT VIC AB 0 CT1 27 (SUTURE) ×1
SUT VIC AB 0 CT1 27XBRD ANTBC (SUTURE) ×2 IMPLANT
SUT VIC AB 2-0 CT1 27 (SUTURE) ×2
SUT VIC AB 2-0 CT1 27XBRD (SUTURE) ×2 IMPLANT
SUT VIC AB 2-0 CT1 TAPERPNT 27 (SUTURE) ×2 IMPLANT
SUT VIC AB 2-0 SH 27 (SUTURE) ×4
SUT VIC AB 2-0 SH 27X BRD (SUTURE) ×8 IMPLANT
SUT VIC AB 3-0 SH 27 (SUTURE) ×3
SUT VIC AB 3-0 SH 27XBRD (SUTURE) ×6 IMPLANT
SUT VICRYL 0 UR6 27IN ABS (SUTURE) ×6 IMPLANT
SYRINGE 10CC LL (SYRINGE) ×3 IMPLANT
TISSUE REPAIR XENFORM 6X10CM (Tissue) ×3 IMPLANT
TOWEL OR 17X26 10 PK STRL BLUE (TOWEL DISPOSABLE) ×3 IMPLANT
TOWEL OR NON WOVEN STRL DISP B (DISPOSABLE) ×3 IMPLANT
TUBING CONNECTING 10 (TUBING) ×3 IMPLANT

## 2012-05-11 NOTE — Progress Notes (Signed)
Report received from Agustina Caroli, Charity fundraiser. No change from initial assessment. Patient resting comfortably after surgery. Vaginal packing in place. Will continue to follow the plan of care.

## 2012-05-11 NOTE — Anesthesia Preprocedure Evaluation (Signed)
Anesthesia Evaluation  Patient identified by MRN, date of birth, ID band Patient awake    Reviewed: Allergy & Precautions, H&P , NPO status , Patient's Chart, lab work & pertinent test results, reviewed documented beta blocker date and time   History of Anesthesia Complications (+) PONV  Airway Mallampati: II TM Distance: >3 FB Neck ROM: full    Dental No notable dental hx.    Pulmonary neg pulmonary ROS,  breath sounds clear to auscultation  Pulmonary exam normal       Cardiovascular Exercise Tolerance: Good hypertension, On Medications Rhythm:regular Rate:Normal     Neuro/Psych negative neurological ROS  negative psych ROS   GI/Hepatic negative GI ROS, Neg liver ROS, GERD-  ,  Endo/Other  negative endocrine ROSdiabetes, Type 2, Oral Hypoglycemic AgentsHypothyroidism   Renal/GU negative Renal ROS  negative genitourinary   Musculoskeletal   Abdominal   Peds  Hematology negative hematology ROS (+)   Anesthesia Other Findings   Reproductive/Obstetrics negative OB ROS                           Anesthesia Physical Anesthesia Plan  ASA: III  Anesthesia Plan: General and General ETT   Post-op Pain Management:    Induction:   Airway Management Planned:   Additional Equipment:   Intra-op Plan:   Post-operative Plan:   Informed Consent: I have reviewed the patients History and Physical, chart, labs and discussed the procedure including the risks, benefits and alternatives for the proposed anesthesia with the patient or authorized representative who has indicated his/her understanding and acceptance.   Dental Advisory Given  Plan Discussed with: CRNA  Anesthesia Plan Comments:         Anesthesia Quick Evaluation

## 2012-05-11 NOTE — Anesthesia Postprocedure Evaluation (Signed)
  Anesthesia Post-op Note  Patient: Victoria Walker  Procedure(s) Performed: Procedure(s) (LRB): ANTERIOR (CYSTOCELE) AND POSTERIOR REPAIR (RECTOCELE) () VAGINAL VAULT SUSPENSION () CYSTOSCOPY WITH URETHRAL DILATATION ()  Patient Location: PACU  Anesthesia Type: General  Level of Consciousness: awake and alert   Airway and Oxygen Therapy: Patient Spontanous Breathing  Post-op Pain: mild  Post-op Assessment: Post-op Vital signs reviewed, Patient's Cardiovascular Status Stable, Respiratory Function Stable, Patent Airway and No signs of Nausea or vomiting  Last Vitals:  Filed Vitals:   05/11/12 1115  BP: 142/81  Pulse: 61  Temp:   Resp: 10    Post-op Vital Signs: stable   Complications: No apparent anesthesia complications

## 2012-05-11 NOTE — Transfer of Care (Signed)
Immediate Anesthesia Transfer of Care Note  Patient: Sandi Raveling  Procedure(s) Performed: Procedure(s) (LRB) with comments: ANTERIOR (CYSTOCELE) AND POSTERIOR REPAIR (RECTOCELE) () - with graft VAGINAL VAULT SUSPENSION () CYSTOSCOPY WITH URETHRAL DILATATION ()  Patient Location: PACU  Anesthesia Type:General  Level of Consciousness: awake, sedated and patient cooperative  Airway & Oxygen Therapy: Patient Spontanous Breathing and Patient connected to face mask oxygen  Post-op Assessment: Report given to PACU RN and Post -op Vital signs reviewed and stable  Post vital signs: Reviewed and stable  Complications: No apparent anesthesia complications

## 2012-05-11 NOTE — Progress Notes (Signed)
Vitals normal Laboratory tests normal Patient alert and stable Pain minimal and well-controlled- no leg or back pain See in am Cannot take vicoden

## 2012-05-11 NOTE — Op Note (Signed)
Preoperative diagnosis: Vault prolapse, cystocele, rectocele, decreased urinary flow Postop diagnosis: Vault prolapse this latter maneuver was done later in the case to minimize the risk of bleeding, cystocele, rectocele, decreased urinary flow Surgery: Vault prolapse repair, cystocele repair and graft, rectocele repair, cystoscopy and urethral dilation Surgeon: Dr. Lorin Picket Jaylin Roundy Assistant: Pecola Leisure  The patient has the above diagnoses and consented to the above procedure. Extra care was taken with leg positioning to minimize the risk of compartment syndrome and neuropathy and DVT  Patient had a grade 3 cystocele that just reached the introitus with a central defect and somewhat of a trapdoor deformity as well. She has moderate grade 2 rectocele. Vaginal cuff dissented to the mid vaginal vault  With appropriate exposure I placed a 3-0 Vicryl at the vaginal cuff. I instilled 20 cc of a lidocaine epinephrine mixture anteriorly to develop a plane and made my usual T-shaped anterior vaginal wall incision close to the vaginal cuff and just proximal to the urethra. I dissected sharply and bluntly to the white line bilaterally. Eventually I bluntly dissected at the apex laterally to the initial spine and could feel the small sacrospinous ligament bilaterally. This latter maneuver was done later in the case to minimize the risk of bleeding  I did a 2 layer anterior repair not imbricating the bladder neck. I kept very good length not to distort the anatomy. The central defect was reduced.  I cystoscoped the patient. She appeared in have a small ureterocele on the right. There was no distortion of the ureteral orifices. She had excellent urine output throughout the case but was always light blue and difficult for me to say for certain if she was getting good reflux bilaterally. This went on for approximately 25 minutes and i gave gave her a second round of the Indigo Carmine followed by methylene  blue. I switched to a 70 lens and the camera and I could see excellent blue jets bilaterally. I all see on the left side with some difficulty because of the color issue but it was the right side that delayed me. The operating room staff also saw the blue jets on a number of occasions on both sides   Utilizing a Capio device I placed a 0 Ethibond through the sacrospinous ligament 1 full finger breath medial to the ischiall spine in a straight line between the spines. This was done bilaterally. With a rectal examination there was no suture in the rectum or injury to rectum  With my usual technique I placed a 0 Vicryl and the pelvic sidewall the level of the urethrovesical angle. I cut a 10 x 6 prepared dermal graft the shape of a trapezoid and sewed it in place. It was tension-free and laid in very nicely. I trimmed an appropriate amount of anterior vaginal wall mucosa and close anterior vaginal wall mucosa with 2-0 Vicryl on a CT1 needle  I repeated the rectal examination and she had a moderate grade 2 rectocele. Based upon the size in her age I felt was best to repair this. An Allis clamp was placed on the hymenal ring bilaterally and I removed a small triangle perineal skin. I made a long midline peroneal incision with my usual Allis technique. Vaginal mucosa was very thin. I sharply and dissected the vaginal mucosa from the underlying rectovaginal fascia to the sidewall and also mobilized nicely at the apex. She did not have an enterocele  I repeated the rectal examination and in my opinion she needed a  posterior repair in the midline due to thinning and she had no obvious site defect. A 2 layer repair with 2-0 Vicryl was done. I was very pleased with the repair after repeated the rectal examination  Removed only a few millimeters of posterior vaginal wall mucosa and close with 2-0 Vicryl on a CT1 needle exteriorizing the suture in the perineum. I decided not to do a perineal body suture since it did  not appear to need 1 and her perineum a short  At the end of the case she very good length. She no vaginal narrowing. She good support anteriorly and posteriorly. Leg position was good. Urine output was high-volume with blue coloration

## 2012-05-11 NOTE — Interval H&P Note (Signed)
History and Physical Interval Note:  05/11/2012 7:14 AM  Victoria Walker  has presented today for surgery, with the diagnosis of rectocele cystocele vault prolapse   The various methods of treatment have been discussed with the patient and family. After consideration of risks, benefits and other options for treatment, the patient has consented to  Procedure(s) (LRB) with comments: CYSTOSCOPY (N/A) POSTERIOR REPAIR (RECTOCELE) (N/A) - Repair Cystocele, Rectocele Vault Prolapse with Graft  as a surgical intervention .  The patient's history has been reviewed, patient examined, no change in status, stable for surgery.  I have reviewed the patient's chart and labs.  Questions were answered to the patient's satisfaction.     Victoria Walker A

## 2012-05-11 NOTE — Care Management Note (Signed)
    Page 1 of 1   05/12/2012     12:30:48 PM   CARE MANAGEMENT NOTE 05/12/2012  Patient:  Victoria Walker, Victoria Walker   Account Number:  [id number]  Date Initiated:  05/11/2012  Documentation initiated by:  Lanier Clam  Subjective/Objective Assessment:   ADMITTED W/PELVIC ORGAN PROLAPSE.     Action/Plan:   FROM HOME W/SPOUSE.HAS PCP,PHARMACY.   Anticipated DC Date:  05/12/2012   Anticipated DC Plan:  HOME/SELF CARE      DC Planning Services  CM consult      Choice offered to / List presented to:             Status of service:  Completed, signed off Medicare Important Message given?   (If response is "NO", the following Medicare IM given date fields will be blank) Date Medicare IM given:   Date Additional Medicare IM given:    Discharge Disposition:  HOME/SELF CARE  Per UR Regulation:  Reviewed for med. necessity/level of care/duration of stay  If discussed at Long Length of Stay Meetings, dates discussed:    Comments:  05/11/12 Jodye Scali RN,BSN NCM [phone] S/P CYSTOSCOPY.

## 2012-05-12 ENCOUNTER — Encounter (HOSPITAL_COMMUNITY): Payer: Self-pay | Admitting: Urology

## 2012-05-12 LAB — BASIC METABOLIC PANEL
BUN: 22 mg/dL (ref 6–23)
CO2: 21 mEq/L (ref 19–32)
Calcium: 9.6 mg/dL (ref 8.4–10.5)
Chloride: 102 mEq/L (ref 96–112)
Creatinine, Ser: 0.7 mg/dL (ref 0.50–1.10)
GFR calc Af Amer: >90 mL/min (ref >90)
GFR calc non Af Amer: >90 mL/min (ref >90)
Glucose, Bld: 120 mg/dL — ABNORMAL HIGH (ref 70–99)
Potassium: 5.4 mEq/L — ABNORMAL HIGH (ref 3.5–5.1)
Sodium: 137 mEq/L (ref 135–145)

## 2012-05-12 LAB — GLUCOSE, CAPILLARY
Glucose-Capillary: 115 mg/dL — ABNORMAL HIGH (ref 70–99)
Glucose-Capillary: 125 mg/dL — ABNORMAL HIGH (ref 70–99)

## 2012-05-12 LAB — HEMOGLOBIN AND HEMATOCRIT, BLOOD
HCT: 38.5 % (ref 36.0–46.0)
Hemoglobin: 13.1 g/dL (ref 12.0–15.0)

## 2012-05-12 MED ORDER — CIPROFLOXACIN HCL 250 MG PO TABS: 250.0000 mg | ORAL_TABLET | Freq: Two times a day (BID) | ORAL | Status: DC

## 2012-05-12 MED ORDER — HYDROMORPHONE HCL 2 MG PO TABS: 2.0000 mg | ORAL_TABLET | Freq: Four times a day (QID) | ORAL | Status: DC | PRN

## 2012-05-12 MED ORDER — FLUCONAZOLE 100 MG PO TABS: 100.0000 mg | ORAL_TABLET | Freq: Every day | ORAL | Status: DC

## 2012-05-12 NOTE — Progress Notes (Signed)
Vitals normal Laboratory tests normal Patient alert and stable Pain minimal and well-controlled- no flank or leg pain Post treatment course discussed in detail Followup discussed in detail See orders

## 2012-05-12 NOTE — Progress Notes (Signed)
D/C instructions reviewed with the pt and her husband using teach back method. All Questions and concerns were addressed. Pt alert and oriented x4, VSS, NAD, skin intact, pain controlled, all personal belongings sent with pt. Pt voiding well.

## 2012-05-12 NOTE — Discharge Summary (Signed)
Date of admission: 05/11/2012  Date of discharge: 05/12/2012  Admission diagnosis: cystocele  Discharge diagnosis: cystocele/rectocele  Secondary diagnoses: vault prolapse  History and Physical: For full details, please see admission history and physical. Briefly, Jonessa Triplett is a 62 y.o. year old patient with the above diagnosis.   Hospital Course: Surgery was uneventful. Post op course no issues.  Laboratory values:  Basename 05/12/12 0546 05/11/12 1054  HGB 13.1 14.4  HCT 38.5 42.3    Basename 05/12/12 0546  CREATININE 0.70    Disposition: Home  Discharge instruction: The patient was instructed to be ambulatory but told to refrain from heavy lifting, strenuous activity, or driving. Discussed with pt.  Discharge medications:    Medication List     As of 05/12/2012  7:42 AM    START taking these medications         ciprofloxacin 250 MG tablet   Commonly known as: CIPRO   Take 1 tablet (250 mg total) by mouth 2 (two) times daily.      fluconazole 100 MG tablet   Commonly known as: DIFLUCAN   Take 1 tablet (100 mg total) by mouth daily.      HYDROmorphone 2 MG tablet   Commonly known as: DILAUDID   Take 1 tablet (2 mg total) by mouth every 6 (six) hours as needed for pain.      CONTINUE taking these medications         atorvastatin 20 MG tablet   Commonly known as: LIPITOR      calcium-vitamin D 250-125 MG-UNIT per tablet   Commonly known as: OSCAL WITH D      cycloSPORINE 0.05 % ophthalmic emulsion   Commonly known as: RESTASIS      diltiazem 360 MG 24 hr capsule   Commonly known as: TIAZAC      esomeprazole 40 MG capsule   Commonly known as: NEXIUM      fexofenadine 180 MG tablet   Commonly known as: [name]      fish oil-omega-3 fatty acids 1000 MG capsule      gabapentin 300 MG capsule   Commonly known as: NEURONTIN      irbesartan 150 MG tablet   Commonly known as: AVAPRO      levothyroxine 50 MCG tablet   Commonly known as:  SYNTHROID, LEVOTHROID      loteprednol 0.5 % ophthalmic suspension   Commonly known as: LOTEMAX      metFORMIN 500 MG tablet   Commonly known as: GLUCOPHAGE      metoprolol succinate 25 MG 24 hr tablet   Commonly known as: TOPROL-XL      multivitamin with minerals tablet      OPTIVE SENSITIVE 0.5-0.9 % Soln   Generic drug: Carboxymethylcellul-Glycerin      spironolactone 25 MG tablet   Commonly known as: ALDACTONE      vitamin E 1000 UNIT capsule          Where to get your medications    These are the prescriptions that you need to pick up.   You may get these medications from any pharmacy.         ciprofloxacin 250 MG tablet   fluconazole 100 MG tablet   HYDROmorphone 2 MG tablet            Followup:  Follow-up Information    Follow up with Gay Rape A, MD. (office will call you with appt date and time)    Contact information:   [phone] [facility] [place] [facility] [place] [address] [phone]

## 2012-06-05 ENCOUNTER — Other Ambulatory Visit: Payer: Self-pay

## 2012-06-17 DIAGNOSIS — H2510 Age-related nuclear cataract, unspecified eye: Secondary | ICD-10-CM | POA: Insufficient documentation

## 2012-08-12 ENCOUNTER — Encounter (HOSPITAL_BASED_OUTPATIENT_CLINIC_OR_DEPARTMENT_OTHER): Payer: Self-pay

## 2012-08-12 ENCOUNTER — Emergency Department (HOSPITAL_BASED_OUTPATIENT_CLINIC_OR_DEPARTMENT_OTHER)
Admission: EM | Admit: 2012-08-12 | Discharge: 2012-08-12 | Disposition: A | Discharge status: Home / Self Care | Payer: Medicare Other | Attending: Emergency Medicine | Admitting: Emergency Medicine

## 2012-08-12 DIAGNOSIS — Z880 Allergy status to penicillin: Secondary | ICD-10-CM | POA: Insufficient documentation

## 2012-08-12 DIAGNOSIS — K219 Gastro-esophageal reflux disease without esophagitis: Secondary | ICD-10-CM | POA: Insufficient documentation

## 2012-08-12 DIAGNOSIS — Z79899 Other long term (current) drug therapy: Secondary | ICD-10-CM | POA: Insufficient documentation

## 2012-08-12 DIAGNOSIS — B9689 Other specified bacterial agents as the cause of diseases classified elsewhere: Secondary | ICD-10-CM

## 2012-08-12 DIAGNOSIS — Z9889 Other specified postprocedural states: Secondary | ICD-10-CM | POA: Insufficient documentation

## 2012-08-12 DIAGNOSIS — E7800 Pure hypercholesterolemia, unspecified: Secondary | ICD-10-CM | POA: Insufficient documentation

## 2012-08-12 DIAGNOSIS — E039 Hypothyroidism, unspecified: Secondary | ICD-10-CM | POA: Insufficient documentation

## 2012-08-12 DIAGNOSIS — N760 Acute vaginitis: Secondary | ICD-10-CM | POA: Insufficient documentation

## 2012-08-12 DIAGNOSIS — Z8669 Personal history of other diseases of the nervous system and sense organs: Secondary | ICD-10-CM | POA: Insufficient documentation

## 2012-08-12 DIAGNOSIS — IMO0002 Reserved for concepts with insufficient information to code with codable children: Secondary | ICD-10-CM | POA: Insufficient documentation

## 2012-08-12 DIAGNOSIS — E119 Type 2 diabetes mellitus without complications: Secondary | ICD-10-CM | POA: Insufficient documentation

## 2012-08-12 DIAGNOSIS — Z853 Personal history of malignant neoplasm of breast: Secondary | ICD-10-CM | POA: Insufficient documentation

## 2012-08-12 DIAGNOSIS — Z8739 Personal history of other diseases of the musculoskeletal system and connective tissue: Secondary | ICD-10-CM | POA: Insufficient documentation

## 2012-08-12 DIAGNOSIS — I10 Essential (primary) hypertension: Secondary | ICD-10-CM | POA: Insufficient documentation

## 2012-08-12 LAB — URINALYSIS, ROUTINE W REFLEX MICROSCOPIC
Bilirubin Urine: NEGATIVE
Glucose, UA: NEGATIVE mg/dL
Hgb urine dipstick: NEGATIVE
Ketones, ur: NEGATIVE mg/dL
Nitrite: NEGATIVE
Protein, ur: NEGATIVE mg/dL
Specific Gravity, Urine: 1.024 (ref 1.005–1.030)
Urobilinogen, UA: 0.2 mg/dL (ref 0.0–1.0)
pH: 6 (ref 5.0–8.0)

## 2012-08-12 LAB — WET PREP, GENITAL
Trich, Wet Prep: NONE SEEN
Yeast Wet Prep HPF POC: NONE SEEN

## 2012-08-12 LAB — URINE MICROSCOPIC-ADD ON

## 2012-08-12 MED ORDER — METRONIDAZOLE 500 MG PO TABS: 500.0000 mg | ORAL_TABLET | Freq: Two times a day (BID) | ORAL | Status: DC

## 2012-08-12 NOTE — ED Notes (Signed)
Pt reports she developed vaginal irritation with vaginal d/c 1 week ago.  Two weeks ago started an estrogen cream.

## 2012-08-12 NOTE — ED Provider Notes (Signed)
History     CSN: [id number]  Arrival date & time 08/12/12  9562   First MD Initiated Contact with Patient 08/12/12 [phone]      Chief Complaint  Patient presents with  . Vaginal Itching    (Consider location/radiation/quality/duration/timing/severity/associated sxs/prior treatment) Patient is a 62 y.o. female presenting with vaginal itching.  Vaginal Itching   Pt reports she has had vaginal itching and white discharge for the last several weeks. She had a bladder tack surgery done in January. At a recent followup visit, Urologist was concerned about 'a spot of poor healing' and put her on a small dose of vaginal estrogen cream. She used that for 2 weeks but was advised to stop about a week ago due to irritation. She has continued to have discharge and irritation despite not using the cream for about 10 days. She states her doctor advised her to 'come get it checked out'. Denies any pain, no bleeding, no fever, vomiting or dysuria.   Past Medical History  Diagnosis Date  . Hypertension   . Neuropathy   . High cholesterol   . Hypothyroidism   . Diabetes mellitus without complication     type 2  . GERD (gastroesophageal reflux disease)   . Cancer     breast cancer  . Arthritis     knees, back  . Complication of anesthesia     "hard to wake up, gasping for air"  . PONV (postoperative nausea and vomiting)     Past Surgical History  Procedure Laterality Date  . Thyroid operation  1976    removed tumor  . Abdominal hysterectomy  1988  . Mastectomy  2007    right  . Plantar fascia surgery      right  . Tummy tuck  1986  . Hernia repair  1986  . Cardiac catheterization  1999  . Anterior and posterior repair  05/11/2012    Procedure: ANTERIOR (CYSTOCELE) AND POSTERIOR REPAIR (RECTOCELE);  Surgeon: Martina Sinner, MD;  Location: [facility];  Service: Urology;;  with graft  . Vaginal prolapse repair  05/11/2012    Procedure: VAGINAL VAULT SUSPENSION;  Surgeon: Martina Sinner,  MD;  Location: [facility];  Service: Urology;;  . Cystoscopy with urethral dilatation  05/11/2012    Procedure: CYSTOSCOPY WITH URETHRAL DILATATION;  Surgeon: Martina Sinner, MD;  Location: [facility];  Service: Urology;;    No family history on file.  History  Substance Use Topics  . Smoking status: Never Smoker   . Smokeless tobacco: Never Used  . Alcohol Use: No    OB History   Grav Para Term Preterm Abortions TAB SAB Ect Mult Living                  Review of Systems All other systems reviewed and are negative except as noted in HPI.   Allergies  Penicillins; Sulfa antibiotics; and Codeine  Home Medications   Current Outpatient Rx  Name  Route  Sig  Dispense  Refill  . atorvastatin (LIPITOR) 20 MG tablet   Oral   Take 20 mg by mouth at bedtime.          . calcium-vitamin D (OSCAL WITH D) 250-125 MG-UNIT per tablet   Oral   Take 1 tablet by mouth daily.         . Carboxymethylcellul-Glycerin (OPTIVE SENSITIVE) 0.5-0.9 % SOLN   Ophthalmic   Apply 1 drop to eye daily.         Marland Kitchen  cycloSPORINE (RESTASIS) 0.05 % ophthalmic emulsion   Both Eyes   Place 1 drop into both eyes 2 (two) times daily.         Marland Kitchen diltiazem (TIAZAC) 360 MG 24 hr capsule   Oral   Take 360 mg by mouth every morning.          Marland Kitchen esomeprazole (NEXIUM) 40 MG capsule   Oral   Take 40 mg by mouth daily before breakfast.         . fexofenadine ([name]) 180 MG tablet   Oral   Take 180 mg by mouth every morning.          . fish oil-omega-3 fatty acids 1000 MG capsule   Oral   Take 1 g by mouth daily.          . fluconazole (DIFLUCAN) 100 MG tablet   Oral   Take 1 tablet (100 mg total) by mouth daily.   3 tablet   0   . gabapentin (NEURONTIN) 300 MG capsule   Oral   Take 300 mg by mouth at bedtime.         . irbesartan (AVAPRO) 150 MG tablet   Oral   Take 75 mg by mouth at bedtime.         Marland Kitchen levothyroxine (SYNTHROID, LEVOTHROID) 50 MCG tablet   Oral   Take 50 mcg by  mouth daily before breakfast.          . loteprednol (LOTEMAX) 0.5 % ophthalmic suspension   Left Eye   Place 1 drop into the left eye daily.          . metFORMIN (GLUCOPHAGE) 500 MG tablet   Oral   Take 750 mg by mouth daily at 12 noon.          . metoprolol succinate (TOPROL-XL) 25 MG 24 hr tablet   Oral   Take 25 mg by mouth 2 times daily at 12 noon and 4 pm.          . Multiple Vitamins-Minerals (MULTIVITAMIN WITH MINERALS) tablet   Oral   Take 1 tablet by mouth daily.         Marland Kitchen spironolactone (ALDACTONE) 25 MG tablet   Oral   Take 37.5 mg by mouth every morning.          . vitamin E 1000 UNIT capsule   Oral   Take 1,000 Units by mouth daily.           BP 153/81  Pulse 67  Temp(Src) 97.6 F (36.4 C) (Oral)  Resp 18  Ht 5' 5.5" (1.664 m)  Wt 173 lb (78.472 kg)  BMI 28.34 kg/m2  SpO2 97%  Physical Exam  Nursing note and vitals reviewed. Constitutional: She is oriented to person, place, and time. She appears well-developed and well-nourished.  HENT:  Head: Normocephalic and atraumatic.  Eyes: EOM are normal. Pupils are equal, round, and reactive to light.  Neck: Normal range of motion. Neck supple.  Cardiovascular: Normal rate, normal heart sounds and intact distal pulses.   Pulmonary/Chest: Effort normal and breath sounds normal.  Abdominal: Bowel sounds are normal. She exhibits no distension. There is no tenderness.  Genitourinary:  Small sore on anterior and posterior vaginal wall, likely from surgery, no signs of mucosal infection but there is a thin white discharge. Surgically absent cervix  Musculoskeletal: Normal range of motion. She exhibits no edema and no tenderness.  Neurological: She is alert and oriented to person, place, and  time. She has normal strength. No cranial nerve deficit or sensory deficit.  Skin: Skin is warm and dry. No rash noted.  Psychiatric: She has a normal mood and affect.    ED Course  Procedures (including  critical care time)  Labs Reviewed  WET PREP, GENITAL - Abnormal; Notable for the following:    Clue Cells Wet Prep HPF POC FEW (*)    WBC, Wet Prep HPF POC MODERATE (*)    All other components within normal limits  URINALYSIS, ROUTINE W REFLEX MICROSCOPIC - Abnormal; Notable for the following:    APPearance CLOUDY (*)    Leukocytes, UA MODERATE (*)    All other components within normal limits  URINE MICROSCOPIC-ADD ON - Abnormal; Notable for the following:    Squamous Epithelial / LPF FEW (*)    Bacteria, UA MANY (*)    All other components within normal limits  GC/CHLAMYDIA PROBE AMP  URINE CULTURE   No results found.   1. Bacterial vaginosis       MDM  Wet prep shows BV. UA concerning for infection but she is asymptomatic. Could be contaminate. Will send for culture. Flagyl Rx. PCP followup.        Kristian Hazzard B. Bernette Mayers, MD 08/12/12 1021

## 2012-08-13 LAB — URINE CULTURE: Colony Count: 4000

## 2012-08-13 LAB — GC/CHLAMYDIA PROBE AMP
CT Probe RNA: NEGATIVE
GC Probe RNA: NEGATIVE

## 2012-08-24 ENCOUNTER — Emergency Department (HOSPITAL_BASED_OUTPATIENT_CLINIC_OR_DEPARTMENT_OTHER)
Admission: EM | Admit: 2012-08-24 | Discharge: 2012-08-24 | Disposition: A | Discharge status: Home / Self Care | Payer: Medicare Other | Attending: Emergency Medicine | Admitting: Emergency Medicine

## 2012-08-24 ENCOUNTER — Encounter: Payer: Self-pay | Admitting: Hematology

## 2012-08-24 ENCOUNTER — Encounter (HOSPITAL_BASED_OUTPATIENT_CLINIC_OR_DEPARTMENT_OTHER): Payer: Self-pay

## 2012-08-24 DIAGNOSIS — I10 Essential (primary) hypertension: Secondary | ICD-10-CM | POA: Insufficient documentation

## 2012-08-24 DIAGNOSIS — Z79899 Other long term (current) drug therapy: Secondary | ICD-10-CM | POA: Insufficient documentation

## 2012-08-24 DIAGNOSIS — N9489 Other specified conditions associated with female genital organs and menstrual cycle: Secondary | ICD-10-CM | POA: Insufficient documentation

## 2012-08-24 DIAGNOSIS — Z8739 Personal history of other diseases of the musculoskeletal system and connective tissue: Secondary | ICD-10-CM | POA: Insufficient documentation

## 2012-08-24 DIAGNOSIS — Z880 Allergy status to penicillin: Secondary | ICD-10-CM | POA: Insufficient documentation

## 2012-08-24 DIAGNOSIS — E039 Hypothyroidism, unspecified: Secondary | ICD-10-CM | POA: Insufficient documentation

## 2012-08-24 DIAGNOSIS — E119 Type 2 diabetes mellitus without complications: Secondary | ICD-10-CM | POA: Insufficient documentation

## 2012-08-24 DIAGNOSIS — E7800 Pure hypercholesterolemia, unspecified: Secondary | ICD-10-CM | POA: Insufficient documentation

## 2012-08-24 DIAGNOSIS — Z8742 Personal history of other diseases of the female genital tract: Secondary | ICD-10-CM | POA: Insufficient documentation

## 2012-08-24 DIAGNOSIS — N898 Other specified noninflammatory disorders of vagina: Secondary | ICD-10-CM | POA: Insufficient documentation

## 2012-08-24 DIAGNOSIS — Z853 Personal history of malignant neoplasm of breast: Secondary | ICD-10-CM | POA: Insufficient documentation

## 2012-08-24 DIAGNOSIS — Z8669 Personal history of other diseases of the nervous system and sense organs: Secondary | ICD-10-CM | POA: Insufficient documentation

## 2012-08-24 DIAGNOSIS — K219 Gastro-esophageal reflux disease without esophagitis: Secondary | ICD-10-CM | POA: Insufficient documentation

## 2012-08-24 LAB — WET PREP, GENITAL
Clue Cells Wet Prep HPF POC: NONE SEEN
Trich, Wet Prep: NONE SEEN
Yeast Wet Prep HPF POC: NONE SEEN

## 2012-08-24 MED ORDER — BENZOCAINE-RESORCINOL 5-2 % VA CREA: TOPICAL_CREAM | Freq: Every day | VAGINAL | Status: DC

## 2012-08-24 NOTE — ED Notes (Signed)
Pt was seen a week ago with similar symptoms, took medication, however continues to have symptoms.

## 2012-08-24 NOTE — ED Notes (Signed)
Pt states that she was dx with BV last week, has completed treatment but still having severe burning and itching to the vag.  No urinary s/sx.

## 2012-08-24 NOTE — ED Provider Notes (Signed)
History     CSN: [id number]  Arrival date & time 08/24/12  4098   First MD Initiated Contact with Patient 08/24/12 0809      Chief Complaint  Patient presents with  . Vaginal Itching    (Consider location/radiation/quality/duration/timing/severity/associated sxs/prior treatment) Patient is a 62 y.o. female presenting with vaginal itching. The history is provided by the patient.  Vaginal Itching  She was seen in the emergency department on April 24 for vaginal itching and discharge and diagnosed with bacterial vaginosis. She was treated with a one-week course of metronidazole which gave her some improvement but she continues to have discharge and itching. She denies any pelvic pain, nausea, vomiting, diarrhea. She denies fever, chills, sweats. She is worried that she might have a yeast infection.  Past Medical History  Diagnosis Date  . Hypertension   . Neuropathy   . High cholesterol   . Hypothyroidism   . Diabetes mellitus without complication     type 2  . GERD (gastroesophageal reflux disease)   . Cancer     breast cancer  . Arthritis     knees, back  . Complication of anesthesia     "hard to wake up, gasping for air"  . PONV (postoperative nausea and vomiting)     Past Surgical History  Procedure Laterality Date  . Thyroid operation  1976    removed tumor  . Abdominal hysterectomy  1988  . Mastectomy  2007    right  . Plantar fascia surgery      right  . Tummy tuck  1986  . Hernia repair  1986  . Cardiac catheterization  1999  . Anterior and posterior repair  05/11/2012    Procedure: ANTERIOR (CYSTOCELE) AND POSTERIOR REPAIR (RECTOCELE);  Surgeon: Martina Sinner, MD;  Location: [facility];  Service: Urology;;  with graft  . Vaginal prolapse repair  05/11/2012    Procedure: VAGINAL VAULT SUSPENSION;  Surgeon: Martina Sinner, MD;  Location: [facility];  Service: Urology;;  . Cystoscopy with urethral dilatation  05/11/2012    Procedure: CYSTOSCOPY WITH URETHRAL  DILATATION;  Surgeon: Martina Sinner, MD;  Location: [facility];  Service: Urology;;    History reviewed. No pertinent family history.  History  Substance Use Topics  . Smoking status: Never Smoker   . Smokeless tobacco: Never Used  . Alcohol Use: No    OB History   Grav Para Term Preterm Abortions TAB SAB Ect Mult Living                  Review of Systems  All other systems reviewed and are negative.    Allergies  Penicillins; Sulfa antibiotics; and Codeine  Home Medications   Current Outpatient Rx  Name  Route  Sig  Dispense  Refill  . atorvastatin (LIPITOR) 20 MG tablet   Oral   Take 20 mg by mouth at bedtime.          . calcium-vitamin D (OSCAL WITH D) 250-125 MG-UNIT per tablet   Oral   Take 1 tablet by mouth daily.         . Carboxymethylcellul-Glycerin (OPTIVE SENSITIVE) 0.5-0.9 % SOLN   Ophthalmic   Apply 1 drop to eye daily.         . cycloSPORINE (RESTASIS) 0.05 % ophthalmic emulsion   Both Eyes   Place 1 drop into both eyes 2 (two) times daily.         Marland Kitchen diltiazem (TIAZAC) 360 MG 24  hr capsule   Oral   Take 360 mg by mouth every morning.          Marland Kitchen esomeprazole (NEXIUM) 40 MG capsule   Oral   Take 40 mg by mouth daily before breakfast.         . fexofenadine ([name]) 180 MG tablet   Oral   Take 180 mg by mouth every morning.          . fish oil-omega-3 fatty acids 1000 MG capsule   Oral   Take 1 g by mouth daily.          Marland Kitchen gabapentin (NEURONTIN) 300 MG capsule   Oral   Take 300 mg by mouth at bedtime.         . irbesartan (AVAPRO) 150 MG tablet   Oral   Take 75 mg by mouth at bedtime.         Marland Kitchen levothyroxine (SYNTHROID, LEVOTHROID) 50 MCG tablet   Oral   Take 50 mcg by mouth daily before breakfast.          . loteprednol (LOTEMAX) 0.5 % ophthalmic suspension   Left Eye   Place 1 drop into the left eye daily.          . metFORMIN (GLUCOPHAGE) 500 MG tablet   Oral   Take 750 mg by mouth daily at 12  noon.          . metoprolol succinate (TOPROL-XL) 25 MG 24 hr tablet   Oral   Take 25 mg by mouth 2 times daily at 12 noon and 4 pm.          . Multiple Vitamins-Minerals (MULTIVITAMIN WITH MINERALS) tablet   Oral   Take 1 tablet by mouth daily.         Marland Kitchen spironolactone (ALDACTONE) 25 MG tablet   Oral   Take 37.5 mg by mouth every morning.          . vitamin E 1000 UNIT capsule   Oral   Take 1,000 Units by mouth daily.         . fluconazole (DIFLUCAN) 100 MG tablet   Oral   Take 1 tablet (100 mg total) by mouth daily.   3 tablet   0   . metroNIDAZOLE (FLAGYL) 500 MG tablet   Oral   Take 1 tablet (500 mg total) by mouth 2 (two) times daily.   14 tablet   0     BP 136/89  Pulse 64  Temp(Src) 97.8 F (36.6 C) (Oral)  Resp 18  SpO2 99%  Physical Exam  Nursing note and vitals reviewed.  62 year old female, resting comfortably and in no acute distress. Vital signs are normal. Oxygen saturation is 99%, which is normal. Head is normocephalic and atraumatic. PERRLA, EOMI. Oropharynx is clear. Neck is nontender and supple without adenopathy or JVD. Back is nontender and there is no CVA tenderness. Lungs are clear without rales, wheezes, or rhonchi. Chest is nontender. Heart has regular rate and rhythm without murmur. Abdomen is soft, flat, nontender without masses or hepatosplenomegaly and peristalsis is normoactive. Pelvic: Normal external female genitalia, cervix is surgically absent, thin white vaginal discharge is present. On bimanual exam, there is no tenderness. Bladder sling he is palpable and nontender. Extremities have no cyanosis or edema, full range of motion is present. Skin is warm and dry without rash. Neurologic: Mental status is normal, cranial nerves are intact, there are no motor or sensory deficits.  ED  Course  Procedures (including critical care time)  Results for orders placed during the hospital encounter of 08/24/12  WET PREP, GENITAL       Result Value Range   Yeast Wet Prep HPF POC NONE SEEN  NONE SEEN   Trich, Wet Prep NONE SEEN  NONE SEEN   Clue Cells Wet Prep HPF POC NONE SEEN  NONE SEEN   WBC, Wet Prep HPF POC FEW (*) NONE SEEN     1. Vagina itching       MDM  Persistent vaginal discharge and itching. All records are reviewed and on April 24, wet prep only showed few clue cells. Urinalysis had many bacteria but culture did not show UTI. Wet prep is repeated today.  Wet prep shows no evidence of yeast or bacterial vaginosis. She will be treated symptomatically with abscess in is referred to women's clinic for followup.  Dione Booze, MD 08/24/12 [phone]

## 2012-09-03 ENCOUNTER — Ambulatory Visit (INDEPENDENT_AMBULATORY_CARE_PROVIDER_SITE_OTHER): Payer: Medicare Other | Admitting: Medical

## 2012-09-03 ENCOUNTER — Encounter: Payer: Self-pay | Admitting: Medical

## 2012-09-03 VITALS — BP 135/80 | HR 88 | Temp 98.6°F | Ht 65.0 in | Wt 169.8 lb

## 2012-09-03 DIAGNOSIS — N899 Noninflammatory disorder of vagina, unspecified: Secondary | ICD-10-CM

## 2012-09-03 DIAGNOSIS — N898 Other specified noninflammatory disorders of vagina: Secondary | ICD-10-CM

## 2012-09-03 NOTE — Patient Instructions (Signed)

## 2012-09-03 NOTE — Progress Notes (Signed)
Patient ID: Victoria Walker, female   DOB: [birth year], 62 y.o.   MRN: [id number]  History:  [name] is a 62 y.o. [phone] who presents to clinic today for vaginal irritation and dryness. The patient is unsure whether the irritation is primarily from dryness or from an infection. She was recently seen at [facility] and diagnosed with BV. She was treated with Flagyl, but is still complaining of itching. She denies vaginal discharge or bleeding today. The patient had a hysterectomy in 1988. She is not sexually active. She was put on an estrogen cream by the urologist for the vaginal irritation, but that was discontinued due to her history of breast cancer.    The following portions of the patient's history were reviewed and updated as appropriate: allergies, current medications, past family history, past medical history, past social history, past surgical history and problem list.  Review of Systems:  Pertinent items are noted in HPI.  Objective:  Physical Exam BP 135/80  Pulse 88  Temp(Src) 98.6 F (37 C) (Oral)  Ht 5\' 5"  (1.651 m)  Wt 169 lb 12.8 oz (77.021 kg)  BMI 28.26 kg/m2 GENERAL: Well-developed, well-nourished female in no acute distress.  HEENT: Normocephalic, atraumatic.   LUNGS: Normal rate. Clear to auscultation bilaterally.  HEART: Regular rate and rhythm with no adventitious sounds.  ABDOMEN: Soft, nontender, nondistended. No organomegaly. PELVIC: Normal external female genitalia. Vagina is pink and rugated. There are 2- 5 mm areas of granulation tissue. One on the anterior wall and one on the posterior wall. Silver nitrate applied to both areas. Patient tolerated well.  Normal discharge.  EXTREMITIES: No cyanosis, clubbing, or edema.   Assessment & Plan:  Assessment: Poorly healing granulation tissue s/p bladder surgery  Plans: 1. Silver nitrate today. Will re-assess healing in 2 weeks 2. Patient to follow-up as scheduled with [facility]

## 2012-09-06 ENCOUNTER — Encounter (HOSPITAL_BASED_OUTPATIENT_CLINIC_OR_DEPARTMENT_OTHER): Payer: Self-pay | Admitting: *Deleted

## 2012-09-06 ENCOUNTER — Emergency Department (HOSPITAL_BASED_OUTPATIENT_CLINIC_OR_DEPARTMENT_OTHER)
Admission: EM | Admit: 2012-09-06 | Discharge: 2012-09-07 | Disposition: A | Discharge status: Home / Self Care | Payer: Medicare Other | Attending: Emergency Medicine | Admitting: Emergency Medicine

## 2012-09-06 DIAGNOSIS — Z880 Allergy status to penicillin: Secondary | ICD-10-CM | POA: Insufficient documentation

## 2012-09-06 DIAGNOSIS — E7800 Pure hypercholesterolemia, unspecified: Secondary | ICD-10-CM | POA: Insufficient documentation

## 2012-09-06 DIAGNOSIS — Z79899 Other long term (current) drug therapy: Secondary | ICD-10-CM | POA: Insufficient documentation

## 2012-09-06 DIAGNOSIS — S6991XA Unspecified injury of right wrist, hand and finger(s), initial encounter: Secondary | ICD-10-CM

## 2012-09-06 DIAGNOSIS — Y929 Unspecified place or not applicable: Secondary | ICD-10-CM | POA: Insufficient documentation

## 2012-09-06 DIAGNOSIS — S6990XA Unspecified injury of unspecified wrist, hand and finger(s), initial encounter: Secondary | ICD-10-CM | POA: Insufficient documentation

## 2012-09-06 DIAGNOSIS — Z8669 Personal history of other diseases of the nervous system and sense organs: Secondary | ICD-10-CM | POA: Insufficient documentation

## 2012-09-06 DIAGNOSIS — E039 Hypothyroidism, unspecified: Secondary | ICD-10-CM | POA: Insufficient documentation

## 2012-09-06 DIAGNOSIS — Z853 Personal history of malignant neoplasm of breast: Secondary | ICD-10-CM | POA: Insufficient documentation

## 2012-09-06 DIAGNOSIS — Z8739 Personal history of other diseases of the musculoskeletal system and connective tissue: Secondary | ICD-10-CM | POA: Insufficient documentation

## 2012-09-06 DIAGNOSIS — I10 Essential (primary) hypertension: Secondary | ICD-10-CM | POA: Insufficient documentation

## 2012-09-06 DIAGNOSIS — S59919A Unspecified injury of unspecified forearm, initial encounter: Secondary | ICD-10-CM | POA: Insufficient documentation

## 2012-09-06 DIAGNOSIS — Y9301 Activity, walking, marching and hiking: Secondary | ICD-10-CM | POA: Insufficient documentation

## 2012-09-06 DIAGNOSIS — E119 Type 2 diabetes mellitus without complications: Secondary | ICD-10-CM | POA: Insufficient documentation

## 2012-09-06 DIAGNOSIS — W010XXA Fall on same level from slipping, tripping and stumbling without subsequent striking against object, initial encounter: Secondary | ICD-10-CM | POA: Insufficient documentation

## 2012-09-06 DIAGNOSIS — S59909A Unspecified injury of unspecified elbow, initial encounter: Secondary | ICD-10-CM | POA: Insufficient documentation

## 2012-09-06 NOTE — ED Provider Notes (Signed)
History  This chart was scribed for Victoria Quarry, MD by Ardeen Jourdain, ED Scribe. This patient was seen in room MH07/MH07 and the patient's care was started at 2349.  CSN: [id number]  Arrival date & time 09/06/12  2346   First MD Initiated Contact with Patient 09/06/12 2349      Chief Complaint  Patient presents with  . Wrist Pain     Patient is a 62 y.o. female presenting with wrist pain. The history is provided by the patient. No language interpreter was used.  Wrist Pain This is a new problem. The current episode started 6 to 12 hours ago. The problem occurs constantly. The problem has been gradually worsening. Pertinent negatives include no chest pain, no abdominal pain, no headaches and no shortness of breath. The symptoms are aggravated by bending and twisting. The symptoms are relieved by ice. She has tried a cold compress for the symptoms. The treatment provided mild relief.    HPI Comments: Victoria Walker is a 62 y.o. female who presents to the Emergency Department complaining of a fall that occurred 6 hours ago. She states she was walking when she tripped and fell into the wall. She states she caught herself with her hand. She is c/o gradually worsening, constant right wrist pain. She denies any other injuries at this time. She denies taking anything for the pain.    Past Medical History  Diagnosis Date  . Hypertension   . Neuropathy   . High cholesterol   . Hypothyroidism   . Diabetes mellitus without complication     type 2  . GERD (gastroesophageal reflux disease)   . Cancer     breast cancer  . Arthritis     knees, back  . Complication of anesthesia     "hard to wake up, gasping for air"  . PONV (postoperative nausea and vomiting)     Past Surgical History  Procedure Laterality Date  . Thyroid operation  1976    removed tumor  . Abdominal hysterectomy  1988  . Mastectomy  2007    right  . Plantar fascia surgery      right  . Tummy tuck  1986  .  Hernia repair  1986  . Cardiac catheterization  1999  . Anterior and posterior repair  05/11/2012    Procedure: ANTERIOR (CYSTOCELE) AND POSTERIOR REPAIR (RECTOCELE);  Surgeon: Martina Sinner, MD;  Location: [facility];  Service: Urology;;  with graft  . Vaginal prolapse repair  05/11/2012    Procedure: VAGINAL VAULT SUSPENSION;  Surgeon: Martina Sinner, MD;  Location: [facility];  Service: Urology;;  . Cystoscopy with urethral dilatation  05/11/2012    Procedure: CYSTOSCOPY WITH URETHRAL DILATATION;  Surgeon: Martina Sinner, MD;  Location: [facility];  Service: Urology;;    Family History  Problem Relation Age of Onset  . Hypertension Mother   . Hypertension Father   . Diabetes Sister   . Cancer Sister   . Hypertension Sister   . Diabetes Brother   . Hypertension Brother   . Hypertension Maternal Aunt   . Hypertension Maternal Uncle   . Hypertension Paternal Aunt   . Cancer Paternal Aunt   . Hypertension Paternal Uncle   . Hypertension Maternal Grandmother   . Hypertension Maternal Grandfather   . Hypertension Paternal Grandmother   . Hypertension Paternal Grandfather     History  Substance Use Topics  . Smoking status: Never Smoker   . Smokeless tobacco: Never Used  .  Alcohol Use: No    OB History   Grav Para Term Preterm Abortions TAB SAB Ect Mult Living   3 3 3       3       Review of Systems  Respiratory: Negative for shortness of breath.   Cardiovascular: Negative for chest pain.  Gastrointestinal: Negative for abdominal pain.  Musculoskeletal: Positive for arthralgias.       Right wrist pain  Neurological: Negative for headaches.  All other systems reviewed and are negative.    Allergies  Penicillins; Sulfa antibiotics; and Codeine  Home Medications   Current Outpatient Rx  Name  Route  Sig  Dispense  Refill  . atorvastatin (LIPITOR) 20 MG tablet   Oral   Take 20 mg by mouth at bedtime.          . calcium-vitamin D (OSCAL WITH D) 250-125 MG-UNIT  per tablet   Oral   Take 1 tablet by mouth daily.         . Carboxymethylcellul-Glycerin (OPTIVE SENSITIVE) 0.5-0.9 % SOLN   Ophthalmic   Apply 1 drop to eye daily.         . cycloSPORINE (RESTASIS) 0.05 % ophthalmic emulsion   Both Eyes   Place 1 drop into both eyes 2 (two) times daily.         Marland Kitchen diltiazem (TIAZAC) 360 MG 24 hr capsule   Oral   Take 360 mg by mouth every morning.          Marland Kitchen esomeprazole (NEXIUM) 40 MG capsule   Oral   Take 40 mg by mouth daily before breakfast.         . fexofenadine ([name]) 180 MG tablet   Oral   Take 180 mg by mouth every morning.          . fish oil-omega-3 fatty acids 1000 MG capsule   Oral   Take 1 g by mouth daily.          Marland Kitchen gabapentin (NEURONTIN) 300 MG capsule   Oral   Take 300 mg by mouth at bedtime.         . irbesartan (AVAPRO) 150 MG tablet   Oral   Take 75 mg by mouth at bedtime.         Marland Kitchen levothyroxine (SYNTHROID, LEVOTHROID) 50 MCG tablet   Oral   Take 50 mcg by mouth daily before breakfast.          . loteprednol (LOTEMAX) 0.5 % ophthalmic suspension   Left Eye   Place 1 drop into the left eye daily.          . metFORMIN (GLUCOPHAGE) 500 MG tablet   Oral   Take 750 mg by mouth daily at 12 noon.          . metoprolol succinate (TOPROL-XL) 25 MG 24 hr tablet   Oral   Take 25 mg by mouth 2 times daily at 12 noon and 4 pm.          . spironolactone (ALDACTONE) 25 MG tablet   Oral   Take 37.5 mg by mouth every morning.          . vitamin E 1000 UNIT capsule   Oral   Take 1,000 Units by mouth daily.         . benzocaine-resorcinol (VAGISIL) 5-2 % vaginal cream   Vaginal   Place vaginally at bedtime.   60 g   0   . Multiple Vitamins-Minerals (MULTIVITAMIN WITH  MINERALS) tablet   Oral   Take 1 tablet by mouth daily.           There were no vitals taken for this visit.  Physical Exam  Nursing note and vitals reviewed. Constitutional: She is oriented to person,  place, and time. She appears well-developed and well-nourished. No distress.  HENT:  Head: Normocephalic and atraumatic.  Eyes: EOM are normal. Pupils are equal, round, and reactive to light.  Neck: Normal range of motion. Neck supple. No tracheal deviation present.  Cardiovascular: Normal rate and intact distal pulses.   Pulmonary/Chest: Effort normal. No respiratory distress.  Abdominal: Soft. She exhibits no distension.  Musculoskeletal: Normal range of motion. She exhibits tenderness. She exhibits no edema.  Right wrist injury  Neurological: She is alert and oriented to person, place, and time.  Skin: Skin is warm and dry. She is not diaphoretic.  Psychiatric: She has a normal mood and affect. Her behavior is normal.    ED Course  Procedures (including critical care time)  COORDINATION OF CARE:  12:35 AM-Discussed treatment plan which includes x-Errik Mitchelle of the right wrist and a splint with pt at bedside and pt agreed to plan.    Labs Reviewed - No data to display Dg Wrist Complete Right  09/07/2012   *RADIOLOGY REPORT*  Clinical Data: Larey Seat.  Wrist pain.  RIGHT WRIST - COMPLETE 3+ VIEW  Comparison: None  Findings: The joint spaces are maintained.  No acute fracture. Probable secondary ossification center near the tip of the ulnar styloid.  IMPRESSION: No acute fracture.   Original Report Authenticated By: Rudie Meyer, M.D.     1. Wrist injury, right, initial encounter       MDM  I personally performed the services described in this documentation, which was scribed in my presence. The recorded information has been reviewed and considered. }    Victoria Quarry, MD 09/08/12 [phone]

## 2012-09-06 NOTE — ED Notes (Signed)
Right wrist pain s/p fall, +PMS

## 2012-09-07 ENCOUNTER — Emergency Department (HOSPITAL_BASED_OUTPATIENT_CLINIC_OR_DEPARTMENT_OTHER): Payer: Medicare Other

## 2012-09-07 MED ORDER — ACETAMINOPHEN 325 MG PO TABS
650.0000 mg | ORAL_TABLET | Freq: Once | ORAL | Status: AC
  Administered 2012-09-07: 650 mg via ORAL
  Filled 2012-09-07: qty 2

## 2012-09-07 NOTE — ED Notes (Signed)
Patient transported to X-ray 

## 2012-09-22 ENCOUNTER — Ambulatory Visit
Admission: RE | Admit: 2012-09-22 | Discharge: 2012-09-22 | Disposition: A | Discharge status: Home / Self Care | Payer: Medicare Other | Source: Ambulatory Visit | Attending: Sports Medicine | Admitting: Sports Medicine

## 2012-09-22 ENCOUNTER — Other Ambulatory Visit: Payer: Self-pay | Admitting: Sports Medicine

## 2012-09-22 DIAGNOSIS — S62001A Unspecified fracture of navicular [scaphoid] bone of right wrist, initial encounter for closed fracture: Secondary | ICD-10-CM

## 2012-09-24 ENCOUNTER — Encounter: Payer: Self-pay | Admitting: Obstetrics & Gynecology

## 2012-09-24 ENCOUNTER — Ambulatory Visit (INDEPENDENT_AMBULATORY_CARE_PROVIDER_SITE_OTHER): Payer: Medicare Other | Admitting: Obstetrics & Gynecology

## 2012-09-24 VITALS — BP 132/87 | HR 79 | Temp 98.2°F | Ht 67.0 in | Wt 171.7 lb

## 2012-09-24 DIAGNOSIS — N952 Postmenopausal atrophic vaginitis: Secondary | ICD-10-CM

## 2012-09-24 DIAGNOSIS — Z853 Personal history of malignant neoplasm of breast: Secondary | ICD-10-CM

## 2012-09-24 NOTE — Patient Instructions (Signed)
Anterior and Posterior Repair, Colporraphy Anterior and posterior repair colporraphy is an operation used to fix a prolapse of organs in the genital tract. Prolapse means the falling down, bulging, dropping or drooping of an organ. Organs that commonly prolapse include the rectum, bladder, vagina and uterus. Prolapse can affect a single organ or several organs at the same time. This often worsens when women stop having their monthly periods (menopause) because estrogen loss weakens the muscles and tissues in the genital tract. In addition, prolapse happens when the organs are damaged or weakened. This commonly happens after childbirth and from aging.   Some women feel pelvic pressure or have trouble holding their urine right after childbirth because of stretching and damage to pelvic tissues around the bladder.  Some women have trouble going to the bathroom (defecating) because of trapped stool in the rectum. This is because of damaged tissue around the rectum. These troubles generally get better with time, but may get worse or return with aging. Different types of surgery are often the only form of treatment for severe prolapses.  TYPES OF GENITAL PROLAPSE YOUR CAREGIVER MAY DISCUSS ARE:  A cystocele is a prolapse of the upper (anterior) wall of the vagina. The anterior wall bulges into the vagina and brings the bladder with it.  A rectocele is a prolapse of the lower (posterior) wall of the vagina. The posterior vaginal wall bulges into the vagina and brings the rectum with it.  An enterocele is a prolapse of part of the pelvic organs called the Pouch of [place]. It also involves a portion of the small bowel. It appears as a bulge under the cervix (neck of the womb) at the top of the back wall of the vagina.  A procidentia is a complete prolapse of the uterus and the cervix. The prolapse can be seen and felt coming out of the vagina.  If the uterus is prolapsed, the uterus may be removed  (hysterectomy). Your caregiver will discuss the risks and benefits with you. LET YOUR CAREGIVER KNOW ABOUT:   Allergies to foods and medications.  All the medications you are taking including herbs, over-the-counter medications, eye drops and creams.  Use of illegal drugs.  Smoking or heavy alcohol drinking habits.  Past problems with anesthetics or novocaine.  Possible pregnancy, if this applies.  History of blood clots or other types of blood problems.  History of bleeding problems.  Past surgery.  Other medical or health problems. RISKS AND COMPLICATIONS  Infection. A germ starts growing in the wound. This can usually be treated with antibiotics.  Damage to other organs during surgery.  Bleeding after surgery. Your surgeon takes every precaution to keep this from happening.  Problems urinating. A catheter may be required for a few days.  Prolapse may happen again.  Problems from the anesthesia. BEFORE THE PROCEDURE  Do not take aspirin or blood thinners a week before your surgery unless told otherwise.  Do not eat or drink anything 8 hours before your surgery.  Let your caregiver know if you get a cold or an infection before your surgery.  Plan and arrange for help when you go home from the hospital.  If you smoke, do not smoke for at least 2 weeks before the surgery. PROCEDURE  Usually you are given medication to relax you before the surgery. An IV will be placed in an arm vein for the anesthetic and medications. The anesthesia, spinal, or epidural puts you to sleep. You will be awake but  will be free of pain during surgery.  Different repairs include:  An anterior repair. A cut (incision) is made in the midline section of the front part of the vaginal wall. A triangular shaped piece of vaginal tissue is removed and the stronger healthier tissue is sewn together in order to re-support and suspend the bladder.  A posterior repair. An incision is made midline on  the back wall of the vagina. A triangular portion of vaginal skin is removed to expose the muscle. Excess tissue is removed and stronger, healthier muscle and ligament tissue is sewn together to support the rectum.  In an A and P repair, both of the above procedures are done during the same operation. AFTER THE PROCEDURE You will be taken to a recovery area. Nurses will check your progress and monitor your vital signs (blood pressure, pulse, breathing and temperature). This is done until you are stable. Then you will be transferred to your room. After surgery, you will have a urinary catheter (a small rubber tube to drain your bladder). This will be in place for 2 to 7 days or until your bladder is working properly on its own. The intravenous will be removed in 1 to 3 days. You may have a gauze packing in your vagina to prevent bleeding that will be removed 2 or 3 days after the surgery. You are usually in the hospital 3 to 5 days. SEEK IMMEDIATE MEDICAL CARE IF:   Increased bleeding (more than a small spot) from the vagina develops.  You notice redness, swelling, or increasing pain in vaginal area.  You develop abdominal pain or pain which is getting worse rather than better.  There is pus coming from wounds.  An unexplained oral temperature over 101 F (38.3 C) develops.  There is a bad smell coming from your vaginal area.  You feel lightheaded or faint. Document Released: 06/28/2003 Document Revised: 06/30/2011 Document Reviewed: 10/29/2007 [facility] Patient Information 2014 [place], [place]. Anterior or Posterior Colporrhaphy, Sling Procedure Care After Refer to this sheet in the next few weeks. These instructions provide you with information on caring for yourself after your procedure. Your caregiver may also give you specific instructions. Your treatment has been planned according to current medical practices, but problems sometimes occur. Call your caregiver if you have any problems or  questions after your procedure. HOME CARE INSTRUCTIONS  Rest as much as possible the first 2 weeks.  Avoid heavy lifting (more than 10 pounds or 4.5 kg), pushing, or pulling. Limit stair climbing to once or twice a day the first week, then slowly increase this activity.  Avoid standing for prolonged periods of time.  Talk with your caregiver about when you may resume your usual physical activity.  You may resume your normal diet.  Drink at least 6 8 glasses of non-caffeinated beverages per day.  Eat a well-balanced diet. Daily portions of food from the meat (protein), milk, fruit, vegetable, and bread families are necessary for your health.  Your normal bowel function should return. If constipation should occur, you may:  Take a mild laxative.  Add fruit and bran to your diet.  Drink more liquids.  If additional bladder care is required, you will receive instructions from your caregiver. Call your caregiver if you experience burning, urgency, or frequency on urination or if you are unable to completely empty your bladder.  You may take a shower and wash your hair.  Only take over-the-counter or prescription medicines for pain, fever, or discomfort as  directed by your caregiver.  Clean the incision with water. Do not use a dressing unless the incision is draining or irritated. Check your incision daily for redness, draining, swelling, or separation of the skin. Call your caregiver if you notice any of these.  Keep your perineal area (the area between vagina and rectum) clean and dry. Perform perineal care after every bowel movement and each time you urinate. You may take a sitz bath or sit in a tub of clean, warm water when necessary, unless your caregiver tells you otherwise.  Do not have sexual intercourse until permitted by your caregiver.  It is still important to have a yearly pelvic exam. SEEK MEDICAL CARE IF:  You have shaking chills.  You have an oral temperature  above 102 F (38.9 C).  Your pain is not relieved or becomes severe. MAKE SURE YOU:   Understand these instructions.  Will watch your condition.  Will get help right away if you are not doing well or get worse. Document Released: 11/20/2003 Document Revised: 03/24/2012 Document Reviewed: 08/11/2007 [facility] Patient Information 2014 [place], [place].

## 2012-09-24 NOTE — Progress Notes (Signed)
Subjective:     Patient ID: Victoria Walker, female   DOB: 02/07/1951, 62 y.o.   MRN: [id number]  HPI Pt s/p 'bladder repair' she reports that she was told by the Urologist to f/u here for exam.  She denies problems with voiding at present.     Review of Systems     Objective:   Physical Exam BP 132/87  Pulse 79  Temp(Src) 98.2 F (36.8 C) (Oral)  Ht 5\' 7"  (1.702 m)  Wt 171 lb 11.2 oz (77.883 kg)  BMI 26.89 kg/m2 Pt in NAD  GU: EGBUS: no lesions Vagina: no blood in vault; tissue well healed.  I can feel the band which appear strong.         Assessment:     F/u from: Vault prolapse repair, cystocele repair and graft, rectocele repair, cystoscopy and urethral dilation Family h/o breast ca and personal h/o breast ca Atrophic vaginitis       Plan:     F/u 6 months for pelvic to check for pelvic mass May have intercourse.  reviewed non hormonal treatment for atrophic vaginitis

## 2012-10-04 ENCOUNTER — Ambulatory Visit (INDEPENDENT_AMBULATORY_CARE_PROVIDER_SITE_OTHER): Payer: Medicare Other | Admitting: Internal Medicine

## 2012-10-04 ENCOUNTER — Encounter: Payer: Self-pay | Admitting: Internal Medicine

## 2012-10-04 VITALS — BP 122/84 | HR 74 | Temp 97.6°F | Resp 16 | Ht 65.0 in | Wt 173.0 lb

## 2012-10-04 DIAGNOSIS — N941 Unspecified dyspareunia: Secondary | ICD-10-CM | POA: Insufficient documentation

## 2012-10-04 DIAGNOSIS — I10 Essential (primary) hypertension: Secondary | ICD-10-CM | POA: Insufficient documentation

## 2012-10-04 DIAGNOSIS — E039 Hypothyroidism, unspecified: Secondary | ICD-10-CM

## 2012-10-04 DIAGNOSIS — C50919 Malignant neoplasm of unspecified site of unspecified female breast: Secondary | ICD-10-CM

## 2012-10-04 DIAGNOSIS — Z9010 Acquired absence of unspecified breast and nipple: Secondary | ICD-10-CM

## 2012-10-04 DIAGNOSIS — M899 Disorder of bone, unspecified: Secondary | ICD-10-CM

## 2012-10-04 DIAGNOSIS — K219 Gastro-esophageal reflux disease without esophagitis: Secondary | ICD-10-CM

## 2012-10-04 DIAGNOSIS — Z90710 Acquired absence of both cervix and uterus: Secondary | ICD-10-CM

## 2012-10-04 DIAGNOSIS — E1142 Type 2 diabetes mellitus with diabetic polyneuropathy: Secondary | ICD-10-CM

## 2012-10-04 DIAGNOSIS — Z87448 Personal history of other diseases of urinary system: Secondary | ICD-10-CM | POA: Insufficient documentation

## 2012-10-04 DIAGNOSIS — IMO0001 Reserved for inherently not codable concepts without codable children: Secondary | ICD-10-CM

## 2012-10-04 DIAGNOSIS — IMO0002 Reserved for concepts with insufficient information to code with codable children: Secondary | ICD-10-CM

## 2012-10-04 DIAGNOSIS — E1149 Type 2 diabetes mellitus with other diabetic neurological complication: Secondary | ICD-10-CM

## 2012-10-04 DIAGNOSIS — M810 Age-related osteoporosis without current pathological fracture: Secondary | ICD-10-CM | POA: Insufficient documentation

## 2012-10-04 DIAGNOSIS — E785 Hyperlipidemia, unspecified: Secondary | ICD-10-CM

## 2012-10-04 DIAGNOSIS — M8580 Other specified disorders of bone density and structure, unspecified site: Secondary | ICD-10-CM | POA: Insufficient documentation

## 2012-10-04 DIAGNOSIS — E782 Mixed hyperlipidemia: Secondary | ICD-10-CM | POA: Insufficient documentation

## 2012-10-04 DIAGNOSIS — M722 Plantar fascial fibromatosis: Secondary | ICD-10-CM

## 2012-10-04 DIAGNOSIS — N3289 Other specified disorders of bladder: Secondary | ICD-10-CM

## 2012-10-04 DIAGNOSIS — C50911 Malignant neoplasm of unspecified site of right female breast: Secondary | ICD-10-CM

## 2012-10-04 DIAGNOSIS — E1140 Type 2 diabetes mellitus with diabetic neuropathy, unspecified: Secondary | ICD-10-CM | POA: Insufficient documentation

## 2012-10-04 DIAGNOSIS — Z9011 Acquired absence of right breast and nipple: Secondary | ICD-10-CM

## 2012-10-04 LAB — CBC WITH DIFFERENTIAL/PLATELET
Basophils Absolute: 0 10*3/uL (ref 0.0–0.1)
Basophils Relative: 1 % (ref 0–1)
Eosinophils Absolute: 0.1 10*3/uL (ref 0.0–0.7)
Eosinophils Relative: 1 % (ref 0–5)
HCT: 40.8 % (ref 36.0–46.0)
Hemoglobin: 13.6 g/dL (ref 12.0–15.0)
Lymphocytes Relative: 37 % (ref 12–46)
Lymphs Abs: 3.1 10*3/uL (ref 0.7–4.0)
MCH: 31.3 pg (ref 26.0–34.0)
MCHC: 33.3 g/dL (ref 30.0–36.0)
MCV: 93.8 fL (ref 78.0–100.0)
Monocytes Absolute: 0.6 10*3/uL (ref 0.1–1.0)
Monocytes Relative: 7 % (ref 3–12)
Neutro Abs: 4.5 10*3/uL (ref 1.7–7.7)
Neutrophils Relative %: 54 % (ref 43–77)
Platelets: 249 10*3/uL (ref 150–400)
RBC: 4.35 MIL/uL (ref 3.87–5.11)
RDW: 13.8 % (ref 11.5–15.5)
WBC: 8.3 10*3/uL (ref 4.0–10.5)

## 2012-10-04 NOTE — Patient Instructions (Addendum)
Use luvena every night for 7 days,  then two times a week and use before intercourse

## 2012-10-04 NOTE — Progress Notes (Signed)
Subjective:    Patient ID: Victoria Walker, female    DOB: Feb 21, 1951, 62 y.o.   MRN: [id number]  HPI Victoria Walker is a new pt here to establish care  PMH of Breast cancer  S/P R mastectomy  (7 years post diagnosis -  On Arimidex for 5 years)  ,  HTN  Since her 18's,  Diabetes since age 1 complicated by peripheral neuropathy in her feet,  Hyperlipidemia,  Osteopenia,  Hypothyroidism  S/P partial thyroidectomy.  She is S/p cystocele repair, hysterectomy.  She tells me she has not had a CPE in aquite a while.  She did have recent mm at [facility].    Since her cystocele repair in January she has had painful intercourse.  She reports she was told by her urologist she had two "sores that hadn't healed."  She recently  Did she her GYN and had a silver nitrate treatment about 3 weeks ago.    She has not had intercourse as yet per GYN instructions and she does have vaginal dryness.  Allergies  Allergen Reactions  . Penicillins Itching  . Sulfa Antibiotics Itching  . Codeine Anxiety    Jittery   . Vicodin (Hydrocodone-Acetaminophen)    Past Medical History  Diagnosis Date  . Hypertension   . Neuropathy   . High cholesterol   . Hypothyroidism   . Diabetes mellitus without complication     type 2  . GERD (gastroesophageal reflux disease)   . Cancer     breast cancer  . Arthritis     knees, back  . Complication of anesthesia     "hard to wake up, gasping for air"  . PONV (postoperative nausea and vomiting)    Past Surgical History  Procedure Laterality Date  . Thyroid operation  1976    removed tumor  . Abdominal hysterectomy  1988  . Mastectomy  2007    right  . Plantar fascia surgery      right  . Tummy tuck  1986  . Hernia repair  1986  . Cardiac catheterization  1999  . Anterior and posterior repair  05/11/2012    Procedure: ANTERIOR (CYSTOCELE) AND POSTERIOR REPAIR (RECTOCELE);  Surgeon: Martina Sinner, MD;  Location: [facility];  Service: Urology;;  with graft  . Vaginal  prolapse repair  05/11/2012    Procedure: VAGINAL VAULT SUSPENSION;  Surgeon: Martina Sinner, MD;  Location: [facility];  Service: Urology;;  . Cystoscopy with urethral dilatation  05/11/2012    Procedure: CYSTOSCOPY WITH URETHRAL DILATATION;  Surgeon: Martina Sinner, MD;  Location: [facility];  Service: Urology;;   History   Social History  . Marital Status: Married    Spouse Name: N/A    Number of Children: N/A  . Years of Education: N/A   Occupational History  . Not on file.   Social History Main Topics  . Smoking status: Never Smoker   . Smokeless tobacco: Never Used  . Alcohol Use: No  . Drug Use: No  . Sexually Active: Not Currently    Birth Control/ Protection: Surgical   Other Topics Concern  . Not on file   Social History Narrative  . No narrative on file   Family History  Problem Relation Age of Onset  . Hypertension Mother   . Hypertension Father   . Diabetes Sister   . Cancer Sister   . Hypertension Sister   . Diabetes Brother   . Hypertension Brother   . Hypertension Maternal Aunt   .  Hypertension Maternal Uncle   . Hypertension Paternal Aunt   . Cancer Paternal Aunt   . Hypertension Paternal Uncle   . Hypertension Maternal Grandmother   . Hypertension Maternal Grandfather   . Hypertension Paternal Grandmother   . Hypertension Paternal Grandfather    Patient Active Problem List   Diagnosis Date Noted  . Type II or unspecified type diabetes mellitus without mention of complication, uncontrolled 10/04/2012  . Essential hypertension, benign 10/04/2012  . Other and unspecified hyperlipidemia 10/04/2012  . 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 Diabetic neuropathy 10/04/2012  . S/P hysterectomy 10/04/2012  . Osteopenia 10/04/2012  . Hypothyroidism 10/04/2012  . History of cystocele 10/04/2012  . Dyspareunia 10/04/2012  . Plantar fasciitis 10/04/2012  . Breast cancer 05/06/2011   Current Outpatient Prescriptions on File Prior to Visit  Medication Sig  Dispense Refill  . atorvastatin (LIPITOR) 20 MG tablet Take 20 mg by mouth at bedtime.       . calcium-vitamin D (OSCAL WITH D) 250-125 MG-UNIT per tablet Take 1 tablet by mouth daily.      . Carboxymethylcellul-Glycerin (OPTIVE SENSITIVE) 0.5-0.9 % SOLN Apply 1 drop to eye daily.      . cycloSPORINE (RESTASIS) 0.05 % ophthalmic emulsion Place 1 drop into both eyes 2 (two) times daily.      Marland Kitchen diltiazem (TIAZAC) 360 MG 24 hr capsule Take 360 mg by mouth every morning.       Marland Kitchen esomeprazole (NEXIUM) 40 MG capsule Take 40 mg by mouth daily before breakfast.      . fexofenadine ([name]) 180 MG tablet Take 180 mg by mouth every morning.       . fish oil-omega-3 fatty acids 1000 MG capsule Take 1 g by mouth daily.       Marland Kitchen gabapentin (NEURONTIN) 300 MG capsule Take 300 mg by mouth at bedtime.      . irbesartan (AVAPRO) 150 MG tablet Take 75 mg by mouth at bedtime.      Marland Kitchen levothyroxine (SYNTHROID, LEVOTHROID) 50 MCG tablet Take 50 mcg by mouth daily before breakfast.       . metFORMIN (GLUCOPHAGE) 500 MG tablet Take 750 mg by mouth daily at 12 noon.       . metoprolol succinate (TOPROL-XL) 25 MG 24 hr tablet Take 25 mg by mouth 2 times daily at 12 noon and 4 pm.       . Multiple Vitamins-Minerals (MULTIVITAMIN WITH MINERALS) tablet Take 1 tablet by mouth daily.      Marland Kitchen spironolactone (ALDACTONE) 25 MG tablet Take 37.5 mg by mouth every morning.       . vitamin E 1000 UNIT capsule Take 1,000 Units by mouth daily.      Marland Kitchen loteprednol (LOTEMAX) 0.5 % ophthalmic suspension Place 1 drop into the left eye daily.        No current facility-administered medications on file prior to visit.       Review of Systems See HPI    Objective:   Physical Exam Physical Exam  Nursing note and vitals reviewed.  Constitutional: She is oriented to person, place, and time. She appears well-developed and well-nourished.  HENT:  Head: Normocephalic and atraumatic.  Cardiovascular: Normal rate and regular rhythm. Exam  reveals no gallop and no friction rub.  No murmur heard.  Pulmonary/Chest: Breath sounds normal. She has no wheezes. She has no rales.  Neurological: She is alert and oriented to person, place, and time.  Skin: Skin is warm and dry.  Psychiatric:  She has a normal mood and affect. Her behavior is normal.             Assessment & Plan:  HTN  Continue current meds  Hyperlipidemia  Will check fasting levels today  Diabetes complicated by neuropathy:  Check AIC  Continue metformin.  Breast Cancer.  7 year survivor  Off Arimidex now  Hypothyroidism  Check TSH today  Continue meds  Dyspareunia S/P  Cystocele repaire  She has had recent silver nitrate treatment.  She is to use Luvena nightly for 1 week then 2 times per week and with intercourse  History of osteopenia  GERD  Further management based on lab results,  Schedule CPE

## 2012-10-05 DIAGNOSIS — K219 Gastro-esophageal reflux disease without esophagitis: Secondary | ICD-10-CM | POA: Insufficient documentation

## 2012-10-05 LAB — COMPREHENSIVE METABOLIC PANEL
ALT: 18 U/L (ref 0–35)
AST: 16 U/L (ref 0–37)
Albumin: 4.4 g/dL (ref 3.5–5.2)
Alkaline Phosphatase: 64 U/L (ref 39–117)
BUN: 17 mg/dL (ref 6–23)
CO2: 29 mEq/L (ref 19–32)
Calcium: 9.6 mg/dL (ref 8.4–10.5)
Chloride: 102 mEq/L (ref 96–112)
Creat: 0.65 mg/dL (ref 0.50–1.10)
Glucose, Bld: 119 mg/dL — ABNORMAL HIGH (ref 70–99)
Potassium: 4.4 mEq/L (ref 3.5–5.3)
Sodium: 139 mEq/L (ref 135–145)
Total Bilirubin: 0.3 mg/dL (ref 0.3–1.2)
Total Protein: 6.8 g/dL (ref 6.0–8.3)

## 2012-10-05 LAB — HEMOGLOBIN A1C
Hgb A1c MFr Bld: 6.4 % — ABNORMAL HIGH (ref <5.7)
Mean Plasma Glucose: 137 mg/dL — ABNORMAL HIGH (ref <117)

## 2012-10-05 LAB — LIPID PANEL
Cholesterol: 208 mg/dL — ABNORMAL HIGH (ref 0–200)
HDL: 67 mg/dL (ref >39)
LDL Cholesterol: 110 mg/dL — ABNORMAL HIGH (ref 0–99)
Total CHOL/HDL Ratio: 3.1 Ratio
Triglycerides: 154 mg/dL — ABNORMAL HIGH (ref <150)
VLDL: 31 mg/dL (ref 0–40)

## 2012-10-05 LAB — TSH: TSH: 0.075 u[IU]/mL — ABNORMAL LOW (ref 0.350–4.500)

## 2012-10-06 ENCOUNTER — Ambulatory Visit: Payer: Medicare Other | Admitting: Internal Medicine

## 2012-10-06 ENCOUNTER — Encounter: Payer: Self-pay | Admitting: *Deleted

## 2012-10-07 DIAGNOSIS — H02889 Meibomian gland dysfunction of unspecified eye, unspecified eyelid: Secondary | ICD-10-CM | POA: Insufficient documentation

## 2012-10-07 DIAGNOSIS — H04129 Dry eye syndrome of unspecified lacrimal gland: Secondary | ICD-10-CM | POA: Insufficient documentation

## 2012-10-18 ENCOUNTER — Telehealth: Payer: Self-pay | Admitting: Internal Medicine

## 2012-10-18 NOTE — Telephone Encounter (Signed)
Left message on mobile to return call to office regarding labwork

## 2012-10-19 ENCOUNTER — Telehealth: Payer: Self-pay | Admitting: Internal Medicine

## 2012-10-19 MED ORDER — LEVOTHYROXINE SODIUM 25 MCG PO TABS: 25.0000 ug | ORAL_TABLET | Freq: Every day | ORAL | Status: DC

## 2012-10-19 NOTE — Telephone Encounter (Signed)
See TSH  Over-replaced  I spoke with pt and will change dose of medication to 25 mcg.  Recheck level at next visit

## 2012-10-20 ENCOUNTER — Other Ambulatory Visit: Payer: Self-pay | Admitting: *Deleted

## 2012-10-20 NOTE — Telephone Encounter (Signed)
Victoria Walker needs her Avapro refilled.  She wants the brand name (she can not take the generic). Pharmacy [facility].

## 2012-10-20 NOTE — Telephone Encounter (Signed)
See Heather's note  Refill request

## 2012-10-21 MED ORDER — IRBESARTAN 150 MG PO TABS: 75.0000 mg | ORAL_TABLET | Freq: Every day | ORAL | Status: DC

## 2012-11-22 ENCOUNTER — Ambulatory Visit: Payer: Medicare Other | Admitting: Internal Medicine

## 2012-12-30 ENCOUNTER — Encounter: Payer: Self-pay | Admitting: Internal Medicine

## 2012-12-30 ENCOUNTER — Ambulatory Visit (INDEPENDENT_AMBULATORY_CARE_PROVIDER_SITE_OTHER): Payer: Medicare Other | Admitting: Internal Medicine

## 2012-12-30 VITALS — BP 121/74 | HR 90 | Temp 98.2°F | Resp 18 | Wt 178.0 lb

## 2012-12-30 DIAGNOSIS — N899 Noninflammatory disorder of vagina, unspecified: Secondary | ICD-10-CM

## 2012-12-30 DIAGNOSIS — N898 Other specified noninflammatory disorders of vagina: Secondary | ICD-10-CM

## 2012-12-30 LAB — POCT URINALYSIS DIPSTICK
Bilirubin, UA: NEGATIVE
Blood, UA: NEGATIVE
Glucose, UA: NEGATIVE
Ketones, UA: NEGATIVE
Leukocytes, UA: NEGATIVE
Nitrite, UA: NEGATIVE
Protein, UA: NEGATIVE
Spec Grav, UA: 1.01
Urobilinogen, UA: NEGATIVE
pH, UA: 7

## 2012-12-30 MED ORDER — METRONIDAZOLE 500 MG PO TABS: 500.0000 mg | ORAL_TABLET | Freq: Three times a day (TID) | ORAL | Status: DC

## 2012-12-30 MED ORDER — FLUCONAZOLE 150 MG PO TABS: 150.0000 mg | ORAL_TABLET | Freq: Once | ORAL | Status: DC

## 2012-12-31 LAB — WET PREP, GENITAL
Clue Cells Wet Prep HPF POC: NONE SEEN
Trich, Wet Prep: NONE SEEN
WBC, Wet Prep HPF POC: NONE SEEN
Yeast Wet Prep HPF POC: NONE SEEN

## 2012-12-31 LAB — GC/CHLAMYDIA PROBE AMP
CT Probe RNA: NEGATIVE
GC Probe RNA: NEGATIVE

## 2012-12-31 LAB — KOH PREP: RESULT - KOH: NONE SEEN

## 2013-01-02 NOTE — Progress Notes (Signed)
Subjective:    Patient ID: Victoria Walker, female    DOB: 08-28-1950, 62 y.o.   MRN: [id number]  HPI  Victoria Walker is here for acute visit.  She reports she has external vaginal irritation that she describes a "burning"  Similar to the BV she has had in the past.  She denies vaginal discharge.  She does report lots of eternal itching.    She does have a history of atrophic vaginitis  Allergies  Allergen Reactions  . Penicillins Itching  . Sulfa Antibiotics Itching  . Codeine Anxiety    Jittery   . Vicodin [Hydrocodone-Acetaminophen]    Past Medical History  Diagnosis Date  . Hypertension   . Neuropathy   . High cholesterol   . Hypothyroidism   . Diabetes mellitus without complication     type 2  . GERD (gastroesophageal reflux disease)   . Cancer     breast cancer  . Arthritis     knees, back  . Complication of anesthesia     "hard to wake up, gasping for air"  . PONV (postoperative nausea and vomiting)    Past Surgical History  Procedure Laterality Date  . Thyroid operation  1976    removed tumor  . Abdominal hysterectomy  1988  . Mastectomy  2007    right  . Plantar fascia surgery      right  . Tummy tuck  1986  . Hernia repair  1986  . Cardiac catheterization  1999  . Anterior and posterior repair  05/11/2012    Procedure: ANTERIOR (CYSTOCELE) AND POSTERIOR REPAIR (RECTOCELE);  Surgeon: Martina Sinner, MD;  Location: [facility];  Service: Urology;;  with graft  . Vaginal prolapse repair  05/11/2012    Procedure: VAGINAL VAULT SUSPENSION;  Surgeon: Martina Sinner, MD;  Location: [facility];  Service: Urology;;  . Cystoscopy with urethral dilatation  05/11/2012    Procedure: CYSTOSCOPY WITH URETHRAL DILATATION;  Surgeon: Martina Sinner, MD;  Location: [facility];  Service: Urology;;   History   Social History  . Marital Status: Married    Spouse Name: N/A    Number of Children: N/A  . Years of Education: N/A   Occupational History  . Not on file.   Social  History Main Topics  . Smoking status: Never Smoker   . Smokeless tobacco: Never Used  . Alcohol Use: No  . Drug Use: No  . Sexual Activity: Not Currently    Birth Control/ Protection: Surgical   Other Topics Concern  . Not on file   Social History Narrative  . No narrative on file   Family History  Problem Relation Age of Onset  . Hypertension Mother   . Hypertension Father   . Diabetes Sister   . Cancer Sister   . Hypertension Sister   . Diabetes Brother   . Hypertension Brother   . Hypertension Maternal Aunt   . Hypertension Maternal Uncle   . Hypertension Paternal Aunt   . Cancer Paternal Aunt   . Hypertension Paternal Uncle   . Hypertension Maternal Grandmother   . Hypertension Maternal Grandfather   . Hypertension Paternal Grandmother   . Hypertension Paternal Grandfather    Patient Active Problem List   Diagnosis Date Noted  . GERD (gastroesophageal reflux disease) 10/05/2012  . Type II or unspecified type diabetes mellitus without mention of complication, uncontrolled 10/04/2012  . Essential hypertension, benign 10/04/2012  . Other and unspecified hyperlipidemia 10/04/2012  . 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  Diabetic neuropathy 10/04/2012  . S/P hysterectomy 10/04/2012  . Osteopenia 10/04/2012  . Hypothyroidism 10/04/2012  . History of cystocele 10/04/2012  . Dyspareunia 10/04/2012  . Plantar fasciitis 10/04/2012  . Breast cancer 05/06/2011   Current Outpatient Prescriptions on File Prior to Visit  Medication Sig Dispense Refill  . atorvastatin (LIPITOR) 20 MG tablet Take 20 mg by mouth at bedtime.       . calcium-vitamin D (OSCAL WITH D) 250-125 MG-UNIT per tablet Take 1 tablet by mouth daily.      . Carboxymethylcellul-Glycerin (OPTIVE SENSITIVE) 0.5-0.9 % SOLN Apply 1 drop to eye daily.      . cycloSPORINE (RESTASIS) 0.05 % ophthalmic emulsion Place 1 drop into both eyes 2 (two) times daily.      Marland Kitchen diltiazem (TIAZAC) 360 MG 24 hr capsule Take 360 mg  by mouth every morning.       Marland Kitchen esomeprazole (NEXIUM) 40 MG capsule Take 40 mg by mouth daily before breakfast.      . fexofenadine ([name]) 180 MG tablet Take 180 mg by mouth every morning.       . fish oil-omega-3 fatty acids 1000 MG capsule Take 1 g by mouth daily.       Marland Kitchen gabapentin (NEURONTIN) 300 MG capsule Take 300 mg by mouth at bedtime.      . irbesartan (AVAPRO) 150 MG tablet Take 0.5 tablets (75 mg total) by mouth at bedtime.  45 tablet  1  . levothyroxine (LEVOTHROID) 25 MCG tablet Take 1 tablet (25 mcg total) by mouth daily before breakfast.  90 tablet  0  . metFORMIN (GLUCOPHAGE) 500 MG tablet Take 750 mg by mouth daily at 12 noon.       . metoprolol succinate (TOPROL-XL) 25 MG 24 hr tablet Take 25 mg by mouth 2 times daily at 12 noon and 4 pm.       . spironolactone (ALDACTONE) 25 MG tablet Take 37.5 mg by mouth every morning.       . vitamin E 1000 UNIT capsule Take 1,000 Units by mouth daily.      Marland Kitchen loteprednol (LOTEMAX) 0.5 % ophthalmic suspension Place 1 drop into the left eye daily.       . Multiple Vitamins-Minerals (MULTIVITAMIN WITH MINERALS) tablet Take 1 tablet by mouth daily.       No current facility-administered medications on file prior to visit.      Review of Systems    see HPI Objective:   Physical Exam Physical Exam  Nursing note and vitals reviewed.  Constitutional: She is oriented to person, place, and time. She appears well-developed and well-nourished.  HENT:  Head: Normocephalic and atraumatic.  Cardiovascular: Normal rate and regular rhythm. Exam reveals no gallop and no friction rub.  No murmur heard.  Pulmonary/Chest: Breath sounds normal. She has no wheezes. She has no rales.  Pelvic. Pale labia No reddened rash Vagina  No lesions noted No adnexal masses Neurological: She is alert and oriented to person, place, and time.  Skin: Skin is warm and dry.  Psychiatric: She has a normal mood and affect. Her behavior is normal.              Assessment & Plan:  Vaginal burning/irritation: Will send for Gc/Chlamydia, wet prep KOH.  Will empirically treat with Diflucan and she would like to try oral metronidazole for a 7 day course.  Advised not to drink ETOH while on oral flagyl  If not improvement in her symptoms she is  to return to see me in office.  Pt voices understanding

## 2013-01-03 ENCOUNTER — Telehealth: Payer: Self-pay | Admitting: *Deleted

## 2013-01-03 NOTE — Telephone Encounter (Signed)
Message copied by Mathews Robinsons on Mon Jan 03, 2013  2:05 PM ------      Message from: Raechel Chute D      Created: Sun Jan 02, 2013  9:37 AM       Call pt and let her know that her results are negative for yeast or bacterial vaginosis.  She can stop the Flagyl if she wishes.  If she still has symptoms have her see me in office ------

## 2013-01-03 NOTE — Telephone Encounter (Signed)
Notified pt of negative results.

## 2013-01-04 ENCOUNTER — Encounter: Payer: Self-pay | Admitting: Internal Medicine

## 2013-01-04 ENCOUNTER — Ambulatory Visit (INDEPENDENT_AMBULATORY_CARE_PROVIDER_SITE_OTHER): Payer: Medicare Other | Admitting: Internal Medicine

## 2013-01-04 VITALS — BP 128/68 | HR 78 | Temp 98.1°F | Resp 18 | Wt 177.0 lb

## 2013-01-04 DIAGNOSIS — E785 Hyperlipidemia, unspecified: Secondary | ICD-10-CM

## 2013-01-04 DIAGNOSIS — R0789 Other chest pain: Secondary | ICD-10-CM

## 2013-01-04 DIAGNOSIS — I10 Essential (primary) hypertension: Secondary | ICD-10-CM

## 2013-01-04 DIAGNOSIS — E039 Hypothyroidism, unspecified: Secondary | ICD-10-CM

## 2013-01-04 DIAGNOSIS — IMO0001 Reserved for inherently not codable concepts without codable children: Secondary | ICD-10-CM

## 2013-01-04 DIAGNOSIS — R002 Palpitations: Secondary | ICD-10-CM

## 2013-01-04 DIAGNOSIS — Z23 Encounter for immunization: Secondary | ICD-10-CM

## 2013-01-04 DIAGNOSIS — Z0000 Encounter for general adult medical examination without abnormal findings: Secondary | ICD-10-CM

## 2013-01-04 DIAGNOSIS — Z8669 Personal history of other diseases of the nervous system and sense organs: Secondary | ICD-10-CM

## 2013-01-04 LAB — HEMOGLOBIN A1C
Hgb A1c MFr Bld: 7.1 % — ABNORMAL HIGH (ref <5.7)
Mean Plasma Glucose: 157 mg/dL — ABNORMAL HIGH (ref <117)

## 2013-01-04 LAB — TSH: TSH: 0.536 u[IU]/mL (ref 0.350–4.500)

## 2013-01-04 MED ORDER — NYSTATIN-TRIAMCINOLONE 100000-0.1 UNIT/GM-% EX OINT: TOPICAL_OINTMENT | CUTANEOUS | Status: DC

## 2013-01-04 NOTE — Progress Notes (Signed)
Subjective:    Patient ID: Victoria Walker, female    DOB: 1950-07-28, 63 y.o.   MRN: [id number]  HPI Victoria Walker is here for CPE  Last visit she had burning of vulvar area.  Labs all negative  .  She states symptoms have resolved now. She is to follow up with GYN at [facility] Dr. Erin Fulling.  Pt is aware of this  She has been having palpitations and one episode of L sided chest pain radiating to jaw.  She Has seen Dr. Algie Coffer who did an ECHO and stress test per her report and placed her on metoprolol.  She would like to see a cardiologist in this area.  No recent episodes of palpitaitons or chest pressure.  She was slighlty over-replaced on her Synthroid and I reduced the dose to 25 mcg back in early July.  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  3-D mm done 09/2012 no sig changes  She states her glucoses at home are less than 140 most days. Toleratng meds well .  She is on an ARB  And statin.    She has a history of iritis  And has seen Dr. Hazle Quant and an [facility] opthalmologist for this.  NO paresthesias of feet.    Allergies  Allergen Reactions  . Penicillins Itching  . Sulfa Antibiotics Itching  . Codeine Anxiety    Jittery   . Vicodin [Hydrocodone-Acetaminophen]    Past Medical History  Diagnosis Date  . Hypertension   . Neuropathy   . High cholesterol   . Hypothyroidism   . Diabetes mellitus without complication     type 2  . GERD (gastroesophageal reflux disease)   . Cancer     breast cancer  . Arthritis     knees, back  . Complication of anesthesia     "hard to wake up, gasping for air"  . PONV (postoperative nausea and vomiting)    Past Surgical History  Procedure Laterality Date  . Thyroid operation  1976    removed tumor  . Abdominal hysterectomy  1988  . Mastectomy  2007    right  . Plantar fascia surgery      right  . Tummy tuck  1986  . Hernia repair  1986  . Cardiac catheterization  1999  . Anterior and  posterior repair  05/11/2012    Procedure: ANTERIOR (CYSTOCELE) AND POSTERIOR REPAIR (RECTOCELE);  Surgeon: Martina Sinner, MD;  Location: [facility];  Service: Urology;;  with graft  . Vaginal prolapse repair  05/11/2012    Procedure: VAGINAL VAULT SUSPENSION;  Surgeon: Martina Sinner, MD;  Location: [facility];  Service: Urology;;  . Cystoscopy with urethral dilatation  05/11/2012    Procedure: CYSTOSCOPY WITH URETHRAL DILATATION;  Surgeon: Martina Sinner, MD;  Location: [facility];  Service: Urology;;   History   Social History  . Marital Status: Married    Spouse Name: N/A    Number of Children: N/A  . Years of Education: N/A   Occupational History  . Not on file.   Social History Main Topics  . Smoking status: Never Smoker   . Smokeless tobacco: Never Used  . Alcohol Use: No  . Drug Use: No  . Sexual Activity: Not Currently    Birth Control/ Protection: Surgical   Other Topics Concern  . Not on file   Social History Narrative  . No narrative on file   Family  History  Problem Relation Age of Onset  . Hypertension Mother   . Hypertension Father   . Diabetes Sister   . Cancer Sister   . Hypertension Sister   . Diabetes Brother   . Hypertension Brother   . Hypertension Maternal Aunt   . Hypertension Maternal Uncle   . Hypertension Paternal Aunt   . Cancer Paternal Aunt   . Hypertension Paternal Uncle   . Hypertension Maternal Grandmother   . Hypertension Maternal Grandfather   . Hypertension Paternal Grandmother   . Hypertension Paternal Grandfather    Patient Active Problem List   Diagnosis Date Noted  . History of iritis 01/04/2013  . GERD (gastroesophageal reflux disease) 10/05/2012  . Type II or unspecified type diabetes mellitus without mention of complication, uncontrolled 10/04/2012  . Essential hypertension, benign 10/04/2012  . Other and unspecified hyperlipidemia 10/04/2012  . 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 Diabetic neuropathy 10/04/2012  . S/P  hysterectomy 10/04/2012  . Osteopenia 10/04/2012  . Hypothyroidism 10/04/2012  . History of cystocele 10/04/2012  . Dyspareunia 10/04/2012  . Plantar fasciitis 10/04/2012  . Breast cancer 05/06/2011   Current Outpatient Prescriptions on File Prior to Visit  Medication Sig Dispense Refill  . atorvastatin (LIPITOR) 20 MG tablet Take 20 mg by mouth at bedtime.       . calcium-vitamin D (OSCAL WITH D) 250-125 MG-UNIT per tablet Take 1 tablet by mouth daily.      . Carboxymethylcellul-Glycerin (OPTIVE SENSITIVE) 0.5-0.9 % SOLN Apply 1 drop to eye daily.      Marland Kitchen diltiazem (TIAZAC) 360 MG 24 hr capsule Take 360 mg by mouth every morning.       Marland Kitchen esomeprazole (NEXIUM) 40 MG capsule Take 40 mg by mouth daily before breakfast.      . fexofenadine ([name]) 180 MG tablet Take 180 mg by mouth every morning.       . fish oil-omega-3 fatty acids 1000 MG capsule Take 1 g by mouth daily.       Marland Kitchen gabapentin (NEURONTIN) 300 MG capsule Take 300 mg by mouth at bedtime.      . irbesartan (AVAPRO) 150 MG tablet Take 0.5 tablets (75 mg total) by mouth at bedtime.  45 tablet  1  . levothyroxine (LEVOTHROID) 25 MCG tablet Take 1 tablet (25 mcg total) by mouth daily before breakfast.  90 tablet  0  . loteprednol (LOTEMAX) 0.5 % ophthalmic suspension Place 1 drop into the left eye daily.       . metFORMIN (GLUCOPHAGE) 500 MG tablet Take 750 mg by mouth daily at 12 noon.       . metoprolol succinate (TOPROL-XL) 25 MG 24 hr tablet Take 25 mg by mouth 2 times daily at 12 noon and 4 pm.       . metroNIDAZOLE (FLAGYL) 500 MG tablet Take 1 tablet (500 mg total) by mouth 3 (three) times daily.  21 tablet  0  . Multiple Vitamins-Minerals (MULTIVITAMIN WITH MINERALS) tablet Take 1 tablet by mouth daily.      Marland Kitchen spironolactone (ALDACTONE) 25 MG tablet Take 37.5 mg by mouth every morning.       . vitamin E 1000 UNIT capsule Take 1,000 Units by mouth daily.      . cycloSPORINE (RESTASIS) 0.05 % ophthalmic emulsion Place 1 drop  into both eyes 2 (two) times daily.       No current facility-administered medications on file prior to visit.       Review of Systems  See HPI    Objective:   Physical Exam  Physical Exam  Nursing note and vitals reviewed.  Constitutional: She is oriented to person, place, and time. She appears well-developed and well-nourished.  HENT:  Head: Normocephalic and atraumatic.  Right Ear: Tympanic membrane and ear canal normal. No drainage. Tympanic membrane is not injected and not erythematous.  Left Ear: Tympanic membrane and ear canal normal. No drainage. Tympanic membrane is not injected and not erythematous.  Nose: Nose normal. Right sinus exhibits no maxillary sinus tenderness and no frontal sinus tenderness. Left sinus exhibits no maxillary sinus tenderness and no frontal sinus tenderness.  Mouth/Throat: Oropharynx is clear and moist. No oral lesions. No oropharyngeal exudate.  Eyes: Conjunctivae and EOM are normal. Pupils are equal, round, and reactive to light.  Neck: Normal range of motion. Neck supple. No JVD present. Carotid bruit is not present. No mass and no thyromegaly present.  Cardiovascular: Normal rate, regular rhythm, S1 normal, S2 normal and intact distal pulses. Exam reveals no gallop and no friction rub.  No murmur heard.  Pulses:  Carotid pulses are 2+ on the right side, and 2+ on the left side.  Dorsalis pedis pulses are 2+ on the right side, and 2+ on the left side.  No carotid bruit. No LE edema  Pulmonary/Chest: Breath sounds normal. She has no wheezes. She has no rales. She exhibits no tenderness.  L breast no discrete masses no nipple discharge no axillary adenopathy.  R well healed mastectomy scar Abdominal: Soft. Bowel sounds are normal. She exhibits no distension and no mass. There is no hepatosplenomegaly. There is no tenderness. There is no CVA tenderness.  REctal no mass guaiac neg Musculoskeletal: Normal range of motion.  No active synovitis to  joints.  Lymphadenopathy:  She has no cervical adenopathy.  She has no axillary adenopathy.  Right: No inguinal and no supraclavicular adenopathy present.  Left: No inguinal and no supraclavicular adenopathy present.  Neurological: She is alert and oriented to person, place, and time. She has normal strength and normal reflexes. She displays no tremor. No cranial nerve deficit or sensory deficit. Coordination and gait normal.  Skin: Skin is warm and dry. No rash noted. No cyanosis. Nails show no clubbing. Foot exam no rashes no ulceraitons Normal sensation to microfilamnet Good pedal pulses  Psychiatric: She has a normal mood and affect. Her speech is normal and behavior is normal. Cognition and memory are normal.             Assessment & Plan:  Health maintenance  Influenza today  Will check with insurance for Zostavax  colonsocopy 2016  Diabetes  See HM sheet utd  Check AIC today  On arb and statin  Hyporthyroidism  Will check TSH today  Palpitations  EKG  Nonspecific ST-t changes  Atypical chest pain she wishes to see cardiologist here.  On beta blocker  Will refer to Dr. Jens Som  htn continue curren tmeds  hyeprlipidemia  Continue current meds  Chest wall lipoma  Breast cancer  Vulvar itching/burning  Will give Mycolog creme apply bid for 2 weeks  See me in 3 months

## 2013-01-04 NOTE — Patient Instructions (Addendum)
See [facility] in December  Dr. Erin Fulling  Will set up referral to Dr. Jens Som ( heart doctor)  Apply yeast medicine twice a day for 2 weeks then stop    See me in 3 months

## 2013-01-05 ENCOUNTER — Other Ambulatory Visit: Payer: Self-pay | Admitting: *Deleted

## 2013-01-05 ENCOUNTER — Encounter: Payer: Self-pay | Admitting: Cardiology

## 2013-01-05 ENCOUNTER — Ambulatory Visit (INDEPENDENT_AMBULATORY_CARE_PROVIDER_SITE_OTHER): Payer: Medicare Other | Admitting: Cardiology

## 2013-01-05 VITALS — BP 120/86 | HR 74 | Ht 65.0 in | Wt 179.0 lb

## 2013-01-05 DIAGNOSIS — I10 Essential (primary) hypertension: Secondary | ICD-10-CM

## 2013-01-05 DIAGNOSIS — R079 Chest pain, unspecified: Secondary | ICD-10-CM

## 2013-01-05 NOTE — Progress Notes (Signed)
HPI: 62 year old female for evaluation of chest pain. Patient had a cardiac catheterization in 1999 because of palpitations that she states was normal. She has had previous echoes and stress test but not in years. Approximately 7 months ago she had chest pain in the substernal area radiating to her teeth. It lasted approximately 5 minutes and resolved spontaneously. There was no associated nausea, diaphoresis or dyspnea. The pain was not pleuritic, positional or related to food. She has had no further chest pain since then. She has mild dyspnea on exertion but no orthopnea, PND, pedal edema or exertional chest pain. Because of the above we were asked to evaluate.  Current Outpatient Prescriptions  Medication Sig Dispense Refill  . atorvastatin (LIPITOR) 20 MG tablet Take 20 mg by mouth at bedtime.       . calcium-vitamin D (OSCAL WITH D) 250-125 MG-UNIT per tablet Take 1 tablet by mouth daily.      . Carboxymethylcellul-Glycerin (OPTIVE SENSITIVE) 0.5-0.9 % SOLN Apply 1 drop to eye daily.      . cycloSPORINE (RESTASIS) 0.05 % ophthalmic emulsion Place 1 drop into both eyes 2 (two) times daily.      Marland Kitchen diltiazem (TIAZAC) 360 MG 24 hr capsule Take 360 mg by mouth every morning.       Marland Kitchen esomeprazole (NEXIUM) 40 MG capsule Take 40 mg by mouth daily before breakfast.      . fexofenadine ([name]) 180 MG tablet Take 180 mg by mouth every morning.       . fish oil-omega-3 fatty acids 1000 MG capsule Take 1 g by mouth daily.       Marland Kitchen gabapentin (NEURONTIN) 300 MG capsule Take 300 mg by mouth at bedtime.      . irbesartan (AVAPRO) 150 MG tablet Take 0.5 tablets (75 mg total) by mouth at bedtime.  45 tablet  1  . levothyroxine (LEVOTHROID) 25 MCG tablet Take 1 tablet (25 mcg total) by mouth daily before breakfast.  90 tablet  0  . loteprednol (LOTEMAX) 0.5 % ophthalmic suspension Place 1 drop into the left eye daily.       . metFORMIN (GLUCOPHAGE) 500 MG tablet Take 750 mg by mouth daily at 12 noon.       .  metoprolol succinate (TOPROL-XL) 25 MG 24 hr tablet Take 25 mg by mouth 2 times daily at 12 noon and 4 pm.       . metroNIDAZOLE (FLAGYL) 500 MG tablet Take 1 tablet (500 mg total) by mouth 3 (three) times daily.  21 tablet  0  . Multiple Vitamins-Minerals (MULTIVITAMIN WITH MINERALS) tablet Take 1 tablet by mouth daily.      Marland Kitchen nystatin-triamcinolone ointment (MYCOLOG) Apply to skin bid for two weeks  30 g  0  . spironolactone (ALDACTONE) 25 MG tablet Take 37.5 mg by mouth every morning.       . vitamin E 1000 UNIT capsule Take 1,000 Units by mouth daily.       No current facility-administered medications for this visit.    Allergies  Allergen Reactions  . Penicillins Itching  . Sulfa Antibiotics Itching  . Codeine Anxiety    Jittery   . Vicodin [Hydrocodone-Acetaminophen]     Past Medical History  Diagnosis Date  . Hypertension   . Neuropathy   . High cholesterol   . Hypothyroidism   . Diabetes mellitus without complication     type 2  . GERD (gastroesophageal reflux disease)   . Cancer  breast cancer  . Arthritis     knees, back    Past Surgical History  Procedure Laterality Date  . Thyroid operation  1976    removed tumor  . Abdominal hysterectomy  1988  . Mastectomy  2007    right  . Plantar fascia surgery      right  . Tummy tuck  1986  . Hernia repair  1986  . Cardiac catheterization  1999  . Anterior and posterior repair  05/11/2012    Procedure: ANTERIOR (CYSTOCELE) AND POSTERIOR REPAIR (RECTOCELE);  Surgeon: Martina Sinner, MD;  Location: [facility];  Service: Urology;;  with graft  . Vaginal prolapse repair  05/11/2012    Procedure: VAGINAL VAULT SUSPENSION;  Surgeon: Martina Sinner, MD;  Location: [facility];  Service: Urology;;  . Cystoscopy with urethral dilatation  05/11/2012    Procedure: CYSTOSCOPY WITH URETHRAL DILATATION;  Surgeon: Martina Sinner, MD;  Location: [facility];  Service: Urology;;    History   Social History  . Marital Status:  Married    Spouse Name: N/A    Number of Children: 3  . Years of Education: N/A   Occupational History  .      Retired   Social History Main Topics  . Smoking status: Never Smoker   . Smokeless tobacco: Never Used  . Alcohol Use: No  . Drug Use: No  . Sexual Activity: Not Currently    Birth Control/ Protection: Surgical   Other Topics Concern  . Not on file   Social History Narrative  . No narrative on file    Family History  Problem Relation Age of Onset  . Hypertension Mother   . Hypertension Father   . Diabetes Sister   . Cancer Sister   . Hypertension Sister   . Diabetes Brother   . Hypertension Brother   . Hypertension Maternal Aunt   . Hypertension Maternal Uncle   . Hypertension Paternal Aunt   . Cancer Paternal Aunt   . Hypertension Paternal Uncle   . Hypertension Maternal Grandmother   . Hypertension Maternal Grandfather   . Hypertension Paternal Grandmother   . Hypertension Paternal Grandfather     ROS: right knee arthralgias but no fevers or chills, productive cough, hemoptysis, dysphasia, odynophagia, melena, hematochezia, dysuria, hematuria, rash, seizure activity, orthopnea, PND, pedal edema, claudication. Remaining systems are negative.  Physical Exam:   Blood pressure 120/86, pulse 74, height 5\' 5"  (1.651 m), weight 179 lb (81.194 kg).  General:  Well developed/well nourished in NAD Skin warm/dry Patient not depressed No peripheral clubbing Back-normal HEENT-normal/normal eyelids Neck supple/normal carotid upstroke bilaterally; no bruits; no JVD; no thyromegaly chest - CTA/ normal expansion, previous right mastectomy. CV - RRR/normal S1 and S2; no murmurs, rubs or gallops;  PMI nondisplaced Abdomen -NT/ND, no HSM, no mass, + bowel sounds, no bruit 2+ femoral pulses, no bruits Ext-no edema, chords, 2+ DP Neuro-grossly nonfocal  ECG 01/04/2013-sinus rhythm with nonspecific ST changes.

## 2013-01-05 NOTE — Assessment & Plan Note (Signed)
Symptoms somewhat atypical but multiple risk factors. Proceed with nuclear study.

## 2013-01-05 NOTE — Assessment & Plan Note (Signed)
Continue present blood pressure medications. 

## 2013-01-05 NOTE — Patient Instructions (Addendum)
Your physician recommends that you schedule a follow-up appointment in: AS NEEDED PENDING TEST RESULTS  Your physician has requested that you have en exercise stress myoview. For further information please visit [email or web address]. Please follow instruction sheet, as given.    

## 2013-01-06 ENCOUNTER — Ambulatory Visit: Payer: Medicare Other | Admitting: Obstetrics & Gynecology

## 2013-01-06 LAB — MICROALBUMIN / CREATININE URINE RATIO
Creatinine, Urine: 136.4 mg/dL
Microalb Creat Ratio: 4 mg/g (ref 0.0–30.0)
Microalb, Ur: 0.55 mg/dL (ref 0.00–1.89)

## 2013-01-10 ENCOUNTER — Telehealth: Payer: Self-pay | Admitting: *Deleted

## 2013-01-10 DIAGNOSIS — Z139 Encounter for screening, unspecified: Secondary | ICD-10-CM

## 2013-01-10 NOTE — Telephone Encounter (Signed)
Left message for pt to return call regarding where the order for Bone Density needs to be faxed to

## 2013-01-10 NOTE — Telephone Encounter (Signed)
Victoria Walker called and left a message that she has a Bone Density scheduled this week and needs an order faxed to the clinic. I tried calling her back to where she is going, but no answer.

## 2013-01-11 ENCOUNTER — Other Ambulatory Visit: Payer: Self-pay | Admitting: *Deleted

## 2013-01-11 DIAGNOSIS — Z139 Encounter for screening, unspecified: Secondary | ICD-10-CM

## 2013-01-13 ENCOUNTER — Other Ambulatory Visit: Payer: Self-pay | Admitting: Internal Medicine

## 2013-01-13 NOTE — Telephone Encounter (Signed)
Refill request

## 2013-01-17 ENCOUNTER — Ambulatory Visit (HOSPITAL_COMMUNITY): Payer: Medicare Other | Attending: Cardiology | Admitting: Radiology

## 2013-01-17 ENCOUNTER — Encounter: Payer: Self-pay | Admitting: *Deleted

## 2013-01-17 ENCOUNTER — Telehealth: Payer: Self-pay | Admitting: Internal Medicine

## 2013-01-17 VITALS — BP 130/86 | HR 67 | Ht 65.0 in | Wt 180.0 lb

## 2013-01-17 DIAGNOSIS — R5381 Other malaise: Secondary | ICD-10-CM | POA: Insufficient documentation

## 2013-01-17 DIAGNOSIS — R079 Chest pain, unspecified: Secondary | ICD-10-CM

## 2013-01-17 DIAGNOSIS — E119 Type 2 diabetes mellitus without complications: Secondary | ICD-10-CM | POA: Insufficient documentation

## 2013-01-17 DIAGNOSIS — R000 Tachycardia, unspecified: Secondary | ICD-10-CM | POA: Insufficient documentation

## 2013-01-17 DIAGNOSIS — R42 Dizziness and giddiness: Secondary | ICD-10-CM | POA: Insufficient documentation

## 2013-01-17 DIAGNOSIS — R5383 Other fatigue: Secondary | ICD-10-CM | POA: Insufficient documentation

## 2013-01-17 DIAGNOSIS — I4949 Other premature depolarization: Secondary | ICD-10-CM

## 2013-01-17 DIAGNOSIS — R002 Palpitations: Secondary | ICD-10-CM | POA: Insufficient documentation

## 2013-01-17 DIAGNOSIS — R0989 Other specified symptoms and signs involving the circulatory and respiratory systems: Secondary | ICD-10-CM | POA: Insufficient documentation

## 2013-01-17 DIAGNOSIS — R55 Syncope and collapse: Secondary | ICD-10-CM | POA: Insufficient documentation

## 2013-01-17 DIAGNOSIS — R0789 Other chest pain: Secondary | ICD-10-CM | POA: Insufficient documentation

## 2013-01-17 DIAGNOSIS — E785 Hyperlipidemia, unspecified: Secondary | ICD-10-CM | POA: Insufficient documentation

## 2013-01-17 DIAGNOSIS — R0609 Other forms of dyspnea: Secondary | ICD-10-CM | POA: Insufficient documentation

## 2013-01-17 DIAGNOSIS — R0602 Shortness of breath: Secondary | ICD-10-CM

## 2013-01-17 DIAGNOSIS — I10 Essential (primary) hypertension: Secondary | ICD-10-CM | POA: Insufficient documentation

## 2013-01-17 MED ORDER — TECHNETIUM TC 99M SESTAMIBI GENERIC - CARDIOLITE
10.0000 | Freq: Once | INTRAVENOUS | Status: AC | PRN
  Administered 2013-01-17: 10 via INTRAVENOUS

## 2013-01-17 MED ORDER — TECHNETIUM TC 99M SESTAMIBI GENERIC - CARDIOLITE
30.0000 | Freq: Once | INTRAVENOUS | Status: AC | PRN
  Administered 2013-01-17: 30 via INTRAVENOUS

## 2013-01-17 MED ORDER — METFORMIN HCL 500 MG PO TABS: ORAL_TABLET | ORAL | Status: DC

## 2013-01-17 NOTE — Progress Notes (Signed)
[facility] SITE 3 NUCLEAR MED [address] [place], [place] [address] [phone]    Cardiology Nuclear Med Study  Victoria Walker is a 62 y.o. female     MRN : [id number]     DOB: [birth year]  Procedure Date: 01/17/2013  Nuclear Med Background Indication for Stress Test:  Evaluation for Ischemia History:  '99 Cath:normal; ~5 yrs ago [id number] per patient Cardiac Risk Factors: Hypertension, Lipids and NIDDM  Symptoms:  Chest Tightness>Teeth (last episode of chest discomfort was about 63-months ago), Dizziness, DOE, Fatigue, Near Syncope, Palpitations and Rapid HR   Nuclear Pre-Procedure Caffeine/Decaff Intake:  None NPO After: 9:30pm   Lungs:  Clear. O2 Sat: 98% on room air. IV 0.9% NS with Angio Cath:  22g  IV Site: L Hand  IV Started by:  Cathlyn Parsons, RN  Chest Size (in):  40 Cup Size: DD  Height: 5\' 5"  (1.651 m)  Weight:  180 lb (81.647 kg)  BMI:  Body mass index is 29.95 kg/(m^2). Tech Comments:  Toprol held x 12 hours    Nuclear Med Study 1 or 2 day study: 1 day  Stress Test Type:  Stress  Reading MD: Olga Millers, MD  Order Authorizing Provider:  Ripley Fraise  Resting Radionuclide: Technetium 24m Sestamibi  Resting Radionuclide Dose: 11.0 mCi   Stress Radionuclide:  Technetium 9m Sestamibi  Stress Radionuclide Dose: 33.0 mCi           Stress Protocol Rest HR: 67 Stress HR: 142  Rest BP: 130/86 Stress BP: 209/94  Exercise Time (min): 5:47 METS: 7.0   Predicted Max HR: 157 bpm % Max HR: 90.45 bpm Rate Pressure Product: 46962   Dose of Adenosine (mg):  n/a Dose of Lexiscan: n/a mg  Dose of Atropine (mg): n/a Dose of Dobutamine: n/a mcg/kg/min (at max HR)  Stress Test Technologist: Smiley Houseman, CMA-N  Nuclear Technologist:  Domenic Polite, CNMT     Rest Procedure:  Myocardial perfusion imaging was performed at rest 45 minutes following the intravenous administration of Technetium 64m Sestamibi.  Rest ECG: NSR, nonspecific ST  changes.  Stress Procedure:  The patient exercised on the treadmill utilizing the Bruce Protocol for 5:47 minutes. The patient stopped due to fatigue and denied any chest pain.  There were occasional PVC's with rare couplets.  Technetium 37m Sestamibi was injected at peak exercise and myocardial perfusion imaging was performed after a brief delay.  Stress ECG: No significant ST segment change suggestive of ischemia.  QPS Raw Data Images:  Acquisition technically good; normal left ventricular size. Stress Images:  There is decreased uptake in the distal anterior wall and basal inferior wall. Rest Images:  There is decreased uptake in the distal anterior wall and basal inferior wall. Subtraction (SDS):  No evidence of ischemia. Transient Ischemic Dilatation (Normal <1.22):  n/a Lung/Heart Ratio (Normal <0.45):  0.40  Quantitative Gated Spect Images QGS EDV:  56 ml QGS ESV:  11 ml  Impression Exercise Capacity:  Fair exercise capacity. BP Response:  Hypertensive blood pressure response. Clinical Symptoms:  There is dyspnea. ECG Impression:  No significant ST segment change suggestive of ischemia. Comparison with Prior Nuclear Study: No images to compare  Overall Impression:  Low risk stress nuclear study with small, moderate intensity, fixed distal anterior and basal inferior wall defects consistent with soft tissue attenuation/thinning; no ischemia.  LV Ejection Fraction: 80%.  LV Wall Motion:  NL LV Function; NL Wall Motion  Olga Millers

## 2013-01-17 NOTE — Telephone Encounter (Signed)
Spoke with pt and informed of AIC results  She is to take metformin 500 mg one and 1/2 pill bid   She will follow with me in 3 months

## 2013-01-26 ENCOUNTER — Encounter: Payer: Self-pay | Admitting: Internal Medicine

## 2013-01-26 ENCOUNTER — Telehealth: Payer: Self-pay | Admitting: Internal Medicine

## 2013-01-26 DIAGNOSIS — Z9289 Personal history of other medical treatment: Secondary | ICD-10-CM | POA: Insufficient documentation

## 2013-01-26 NOTE — Telephone Encounter (Signed)
Victoria Walker  Call pt and let her know that her bone density shows ostoepenia  ( just starting to thin)  Have her take calcium 1200-1500 mg daily with vitamin D [id number] units daily    Will repeat bone density test in 2 years

## 2013-01-26 NOTE — Telephone Encounter (Signed)
notified pt of bone density results

## 2013-02-01 DIAGNOSIS — H269 Unspecified cataract: Secondary | ICD-10-CM | POA: Insufficient documentation

## 2013-02-02 ENCOUNTER — Ambulatory Visit (INDEPENDENT_AMBULATORY_CARE_PROVIDER_SITE_OTHER): Payer: BLUE CROSS/BLUE SHIELD | Admitting: Podiatry

## 2013-02-02 ENCOUNTER — Encounter: Payer: Self-pay | Admitting: Podiatry

## 2013-02-02 VITALS — BP 138/84 | HR 82 | Ht 65.0 in | Wt 178.0 lb

## 2013-02-02 DIAGNOSIS — E1149 Type 2 diabetes mellitus with other diabetic neurological complication: Secondary | ICD-10-CM

## 2013-02-02 DIAGNOSIS — L600 Ingrowing nail: Secondary | ICD-10-CM | POA: Insufficient documentation

## 2013-02-02 DIAGNOSIS — M722 Plantar fascial fibromatosis: Secondary | ICD-10-CM

## 2013-02-02 DIAGNOSIS — E1142 Type 2 diabetes mellitus with diabetic polyneuropathy: Secondary | ICD-10-CM

## 2013-02-02 DIAGNOSIS — E1140 Type 2 diabetes mellitus with diabetic neuropathy, unspecified: Secondary | ICD-10-CM

## 2013-02-02 NOTE — Patient Instructions (Signed)
Seen for Diabetic foot care. Has ingrown great toe nails that needs be taken care of. May benefit from permanent nail surgery.  Will prepare paper work for Diabetic shoes.

## 2013-02-02 NOTE — Progress Notes (Signed)
Presents for annual diabetic check up. Pain on both great toe nail.  Wishes to have diabetic shoes.  Has neuropathy, burning and tingling with sharp pain daily. Uses compound cream that helps. Also takes Lyrica.   Objective: Neuro: Decreased to Monofilament sensory testing and Vibratory sensatin testing.  Vascular status are within normal. Dermatologic: Ingrown nail on both great toes without drainage.   Orthopedic: No gross osseous deformities noted.   Assessment: Diabetic neuropathy. Ingrown nails bilateral.  Plan: Debrided offending border. Discussed findings.  Patient will benefit from Diabetic shoes.

## 2013-02-04 ENCOUNTER — Encounter: Payer: Self-pay | Admitting: *Deleted

## 2013-02-07 ENCOUNTER — Telehealth: Payer: Self-pay | Admitting: *Deleted

## 2013-02-07 NOTE — Telephone Encounter (Signed)
Faxed paper work for diabetic shoes to [facility]

## 2013-02-09 ENCOUNTER — Encounter: Payer: Self-pay | Admitting: Podiatry

## 2013-02-09 ENCOUNTER — Ambulatory Visit (INDEPENDENT_AMBULATORY_CARE_PROVIDER_SITE_OTHER): Payer: BLUE CROSS/BLUE SHIELD | Admitting: Podiatry

## 2013-02-09 VITALS — BP 124/75 | HR 76 | Ht 65.0 in | Wt 158.0 lb

## 2013-02-09 DIAGNOSIS — E1140 Type 2 diabetes mellitus with diabetic neuropathy, unspecified: Secondary | ICD-10-CM

## 2013-02-09 DIAGNOSIS — E1149 Type 2 diabetes mellitus with other diabetic neurological complication: Secondary | ICD-10-CM

## 2013-02-09 DIAGNOSIS — E1142 Type 2 diabetes mellitus with diabetic polyneuropathy: Secondary | ICD-10-CM

## 2013-02-09 DIAGNOSIS — M216X9 Other acquired deformities of unspecified foot: Secondary | ICD-10-CM

## 2013-02-09 NOTE — Patient Instructions (Signed)
Measured for diabetic shoes. May use elastic ankle support for swollen ankle, compression socks, and lace up well supported shoes.

## 2013-02-09 NOTE — Progress Notes (Signed)
Came in to have diabetic shoes prepared. Using compounding cream for neuropathy pain. Develops swelling in ankle after been on feet. Swelling goes down at night.  Vascular status are within normal. Orthopedic: Rectus foot.  Biomechanics: Upon loading of forefoot, the first ray becomes hypermobile and forefoot is assuming varus position. Subtalar and ankle joint hyperpronation from weak first ray bilateral right>left. Dermatologic: Normotrophic skin bilateral.   Assessment: Diabetic neuropathy. Ankle edema secondary to faulty biomechanics of subtalar joint hyperpronation.   Plan: May benefit from lace up hight top shoes, ankle support, compression dressing, and orthotics.

## 2013-03-19 ENCOUNTER — Encounter (HOSPITAL_BASED_OUTPATIENT_CLINIC_OR_DEPARTMENT_OTHER): Payer: Self-pay | Admitting: Emergency Medicine

## 2013-03-19 ENCOUNTER — Emergency Department (HOSPITAL_BASED_OUTPATIENT_CLINIC_OR_DEPARTMENT_OTHER)
Admission: EM | Admit: 2013-03-19 | Discharge: 2013-03-19 | Disposition: A | Discharge status: Home / Self Care | Payer: Medicare Other | Attending: Emergency Medicine | Admitting: Emergency Medicine

## 2013-03-19 DIAGNOSIS — E7800 Pure hypercholesterolemia, unspecified: Secondary | ICD-10-CM | POA: Insufficient documentation

## 2013-03-19 DIAGNOSIS — E119 Type 2 diabetes mellitus without complications: Secondary | ICD-10-CM | POA: Insufficient documentation

## 2013-03-19 DIAGNOSIS — Z880 Allergy status to penicillin: Secondary | ICD-10-CM | POA: Insufficient documentation

## 2013-03-19 DIAGNOSIS — I10 Essential (primary) hypertension: Secondary | ICD-10-CM | POA: Insufficient documentation

## 2013-03-19 DIAGNOSIS — J019 Acute sinusitis, unspecified: Secondary | ICD-10-CM

## 2013-03-19 DIAGNOSIS — K219 Gastro-esophageal reflux disease without esophagitis: Secondary | ICD-10-CM | POA: Insufficient documentation

## 2013-03-19 DIAGNOSIS — G589 Mononeuropathy, unspecified: Secondary | ICD-10-CM | POA: Insufficient documentation

## 2013-03-19 DIAGNOSIS — Z853 Personal history of malignant neoplasm of breast: Secondary | ICD-10-CM | POA: Insufficient documentation

## 2013-03-19 DIAGNOSIS — Z79899 Other long term (current) drug therapy: Secondary | ICD-10-CM | POA: Insufficient documentation

## 2013-03-19 DIAGNOSIS — E039 Hypothyroidism, unspecified: Secondary | ICD-10-CM | POA: Insufficient documentation

## 2013-03-19 DIAGNOSIS — Z8739 Personal history of other diseases of the musculoskeletal system and connective tissue: Secondary | ICD-10-CM | POA: Insufficient documentation

## 2013-03-19 MED ORDER — AZITHROMYCIN 250 MG PO TABS
500.0000 mg | ORAL_TABLET | Freq: Once | ORAL | Status: AC
  Administered 2013-03-19: 500 mg via ORAL
  Filled 2013-03-19: qty 2

## 2013-03-19 MED ORDER — FLUCONAZOLE 150 MG PO TABS: 150.0000 mg | ORAL_TABLET | Freq: Every day | ORAL | Status: DC

## 2013-03-19 MED ORDER — BENZONATATE 200 MG PO CAPS: 200.0000 mg | ORAL_CAPSULE | Freq: Three times a day (TID) | ORAL | Status: DC | PRN

## 2013-03-19 MED ORDER — AZITHROMYCIN 250 MG PO TABS: 250.0000 mg | ORAL_TABLET | Freq: Every day | ORAL | Status: DC

## 2013-03-19 NOTE — ED Provider Notes (Signed)
CSN: [id number]     Arrival date & time 03/19/13  1002 History   First MD Initiated Contact with Patient 03/19/13 1020     Chief Complaint  Patient presents with  . Sore Throat  . Cough   (Consider location/radiation/quality/duration/timing/severity/associated sxs/prior Treatment) Patient is a 62 y.o. female presenting with pharyngitis and cough.  Sore Throat Associated symptoms include chills, coughing and a sore throat. Pertinent negatives include no abdominal pain, chest pain, fever, headaches or vomiting.  Cough Associated symptoms: chills and sore throat   Associated symptoms: no chest pain, no fever, no headaches and no shortness of breath     62 y/o female here with cough and sinus pressure for 3 days. She states that all of her symptoms began with pharyngeal irritation and cough. The following day she describes chills and possible subjective fever. She has chronic sinus issues and sees an allergist. She uses a neti pot and nasonex as directed. Since the development of this her cough has continued to be non productive and to worsen as well as having worsening sinus pressure. She also endorses sore throat but denies dyspnea.    Past Medical History  Diagnosis Date  . Hypertension   . Neuropathy   . High cholesterol   . Hypothyroidism   . Diabetes mellitus without complication     type 2  . GERD (gastroesophageal reflux disease)   . Cancer     breast cancer  . Arthritis     knees, back   Past Surgical History  Procedure Laterality Date  . Thyroid operation  1976    removed tumor  . Abdominal hysterectomy  1988  . Mastectomy  2007    right  . Plantar fascia surgery      right  . Tummy tuck  1986  . Hernia repair  1986  . Cardiac catheterization  1999  . Anterior and posterior repair  05/11/2012    Procedure: ANTERIOR (CYSTOCELE) AND POSTERIOR REPAIR (RECTOCELE);  Surgeon: Martina Sinner, MD;  Location: [facility];  Service: Urology;;  with graft  . Vaginal prolapse  repair  05/11/2012    Procedure: VAGINAL VAULT SUSPENSION;  Surgeon: Martina Sinner, MD;  Location: [facility];  Service: Urology;;  . Cystoscopy with urethral dilatation  05/11/2012    Procedure: CYSTOSCOPY WITH URETHRAL DILATATION;  Surgeon: Martina Sinner, MD;  Location: [facility];  Service: Urology;;   Family History  Problem Relation Age of Onset  . Hypertension Mother   . Hypertension Father   . Diabetes Sister   . Cancer Sister   . Hypertension Sister   . Diabetes Brother   . Hypertension Brother   . Hypertension Maternal Aunt   . Hypertension Maternal Uncle   . Hypertension Paternal Aunt   . Cancer Paternal Aunt   . Hypertension Paternal Uncle   . Hypertension Maternal Grandmother   . Hypertension Maternal Grandfather   . Hypertension Paternal Grandmother   . Hypertension Paternal Grandfather    History  Substance Use Topics  . Smoking status: Never Smoker   . Smokeless tobacco: Never Used  . Alcohol Use: No   OB History   Grav Para Term Preterm Abortions TAB SAB Ect Mult Living   3 3 3       3      Review of Systems  Constitutional: Positive for chills. Negative for fever and appetite change.  HENT: Positive for postnasal drip, sinus pressure and sore throat.   Respiratory: Positive for cough. Negative  for shortness of breath.   Cardiovascular: Negative for chest pain.  Gastrointestinal: Negative for vomiting, abdominal pain and diarrhea.  Genitourinary: Negative for dysuria.  Neurological: Negative for headaches.  All other systems reviewed and are negative.    Allergies  Penicillins; Sulfa antibiotics; Codeine; and Vicodin  Home Medications   Current Outpatient Rx  Name  Route  Sig  Dispense  Refill  . atorvastatin (LIPITOR) 20 MG tablet   Oral   Take 20 mg by mouth at bedtime.          . calcium-vitamin D (OSCAL WITH D) 250-125 MG-UNIT per tablet   Oral   Take 1 tablet by mouth daily.         . Carboxymethylcellul-Glycerin (OPTIVE SENSITIVE)  0.5-0.9 % SOLN   Ophthalmic   Apply 1 drop to eye daily.         . clotrimazole (LOTRIMIN) 1 % cream               . cycloSPORINE (RESTASIS) 0.05 % ophthalmic emulsion   Both Eyes   Place 1 drop into both eyes 2 (two) times daily.         Marland Kitchen diltiazem (TIAZAC) 360 MG 24 hr capsule   Oral   Take 360 mg by mouth every morning.          Marland Kitchen esomeprazole (NEXIUM) 40 MG capsule   Oral   Take 40 mg by mouth daily before breakfast.         . fexofenadine ([name]) 180 MG tablet   Oral   Take 180 mg by mouth every morning.          . fish oil-omega-3 fatty acids 1000 MG capsule   Oral   Take 1 g by mouth daily.          . irbesartan (AVAPRO) 150 MG tablet   Oral   Take 0.5 tablets (75 mg total) by mouth at bedtime.   45 tablet   1   . lamoTRIgine (LAMICTAL) 200 MG tablet               . levothyroxine (SYNTHROID, LEVOTHROID) 25 MCG tablet      TAKE 1 TABLET (25 MCG TOTAL) BY MOUTH DAILY BEFORE BREAKFAST.   90 tablet   3   . loteprednol (LOTEMAX) 0.5 % ophthalmic suspension   Left Eye   Place 1 drop into the left eye daily.          Marland Kitchen LYRICA 50 MG capsule   Oral   Take 50 mg by mouth. One tab at midday and two tabs at night         . metFORMIN (GLUCOPHAGE) 500 MG tablet      Take 1.5 tablets twice a day - morning and bedtime   90 tablet   3   . metoprolol succinate (TOPROL-XL) 25 MG 24 hr tablet   Oral   Take 25 mg by mouth 2 times daily at 12 noon and 4 pm.          . Multiple Vitamins-Minerals (MULTIVITAMIN WITH MINERALS) tablet   Oral   Take 1 tablet by mouth daily.         Marland Kitchen NASONEX 50 MCG/ACT nasal spray               . PREMARIN vaginal cream               . spironolactone (ALDACTONE) 25 MG tablet   Oral   Take  37.5 mg by mouth every morning.          . vitamin E 1000 UNIT capsule   Oral   Take 1,000 Units by mouth daily.          BP 140/84  Pulse 74  Temp(Src) 98.5 F (36.9 C) (Oral)  Resp 16  Ht 5\' 5"   (1.651 m)  Wt 175 lb (79.379 kg)  BMI 29.12 kg/m2  SpO2 100% Physical Exam  Nursing note and vitals reviewed. Constitutional: She is oriented to person, place, and time. She appears well-developed and well-nourished. No distress.  HENT:  Head: Normocephalic and atraumatic.  Nose: Right sinus exhibits no maxillary sinus tenderness and no frontal sinus tenderness. Left sinus exhibits maxillary sinus tenderness. Left sinus exhibits no frontal sinus tenderness.  Swollen turbinate in the L nare  Eyes: EOM are normal. Pupils are equal, round, and reactive to light.  Neck: Neck supple.  Cardiovascular: Normal rate, regular rhythm and normal heart sounds.   No murmur heard. Pulmonary/Chest: Effort normal and breath sounds normal.  Abdominal: Soft. Bowel sounds are normal. There is no tenderness. There is no guarding.  Musculoskeletal: She exhibits no edema.  Neurological: She is alert and oriented to person, place, and time.  Skin: Skin is warm and dry. She is not diaphoretic.  Psychiatric: She has a normal mood and affect.    ED Course  Procedures (including critical care time) Labs Review Labs Reviewed - No data to display Imaging Review No results found.  EKG Interpretation   None       MDM  No diagnosis found.  62 year old female here with cough and sinus pressure for 3 days. On exam she is in no respiratory distress appears nontoxic. She is using nasal saline and Nasonex manage her symptoms at home.Considering good sinus management already and sinus pressure, chills,  and tenderness over L maxillary sinus will go ahead and treat with azithromycin for acute sinusitis. Will also give Rx for tessalon Perles for cough.   Red flags provided, return for worsening symptoms.   Murtis Sink, MD [facility] Resident, PGY-2 03/19/2013, 10:40 AM       Elenora Gamma, MD 03/19/13 1044

## 2013-03-19 NOTE — ED Notes (Signed)
Pt states she has sinus drainage since yesterday. Pt has sore throat and cough.  No fever.  Pt states she cannot get up any phlegm.

## 2013-03-19 NOTE — ED Provider Notes (Signed)
I saw and evaluated the patient, reviewed the resident's note and I agree with the findings and plan.   .Face to face Exam:  General:  Awake HEENT:  Atraumatic Resp:  Normal effort Abd:  Nondistended Neuro:No focal weakness  Nelia Shi, MD 03/19/13 1046

## 2013-03-21 ENCOUNTER — Telehealth: Payer: Self-pay | Admitting: *Deleted

## 2013-03-21 NOTE — Telephone Encounter (Signed)
Returned patient's call to let her know it's okay to use the Premara vaginal cream given by her PCP. Instructed patient to use cream no more than 1-2 times a week. Pt verbalized understanding. No further concerns.

## 2013-04-05 ENCOUNTER — Ambulatory Visit (INDEPENDENT_AMBULATORY_CARE_PROVIDER_SITE_OTHER): Payer: Medicare Other | Admitting: Internal Medicine

## 2013-04-05 VITALS — BP 117/74 | HR 77 | Temp 96.8°F | Wt 181.5 lb

## 2013-04-05 DIAGNOSIS — E1140 Type 2 diabetes mellitus with diabetic neuropathy, unspecified: Secondary | ICD-10-CM

## 2013-04-05 DIAGNOSIS — N951 Menopausal and female climacteric states: Secondary | ICD-10-CM

## 2013-04-05 DIAGNOSIS — IMO0001 Reserved for inherently not codable concepts without codable children: Secondary | ICD-10-CM

## 2013-04-05 DIAGNOSIS — E1142 Type 2 diabetes mellitus with diabetic polyneuropathy: Secondary | ICD-10-CM

## 2013-04-05 DIAGNOSIS — I10 Essential (primary) hypertension: Secondary | ICD-10-CM

## 2013-04-05 DIAGNOSIS — E1149 Type 2 diabetes mellitus with other diabetic neurological complication: Secondary | ICD-10-CM

## 2013-04-05 LAB — HEMOGLOBIN A1C
Hgb A1c MFr Bld: 6.9 % — ABNORMAL HIGH (ref <5.7)
Mean Plasma Glucose: 151 mg/dL — ABNORMAL HIGH (ref <117)

## 2013-04-05 NOTE — Patient Instructions (Signed)
See me in 3-4 months diabetes check  30 mins  Get  Dramamine over the counter.  Start taking per directions the day before cruise

## 2013-04-05 NOTE — Progress Notes (Signed)
Subjective:    Patient ID: Victoria Walker, female    DOB: 1950/08/15, 62 y.o.   MRN: [id number]  HPI  Victoria Walker is here for folllow up.   She tells me her home glucose is "OK"  She is taking meds.    She does have neuropathy of her feet and has had podiatric surgery on the tarsal nerve.  She currently sees Dr. Raynald Kemp.   She take Lyrica for her diabetic neuropathy  She is on vaginal estrogen   (Premarin  Dr. McDiarmid)   for atrophic vaginitis and dryness.  She says this is helping.   She reports she has had facial shingles in the past years ago.  Allergies  Allergen Reactions  . Penicillins Itching  . Sulfa Antibiotics Itching  . Codeine Anxiety    Jittery   . Vicodin [Hydrocodone-Acetaminophen]    Past Medical History  Diagnosis Date  . Hypertension   . Neuropathy   . High cholesterol   . Hypothyroidism   . Diabetes mellitus without complication     type 2  . GERD (gastroesophageal reflux disease)   . Cancer     breast cancer  . Arthritis     knees, back   Past Surgical History  Procedure Laterality Date  . Thyroid operation  1976    removed tumor  . Abdominal hysterectomy  1988  . Mastectomy  2007    right  . Plantar fascia surgery      right  . Tummy tuck  1986  . Hernia repair  1986  . Cardiac catheterization  1999  . Anterior and posterior repair  05/11/2012    Procedure: ANTERIOR (CYSTOCELE) AND POSTERIOR REPAIR (RECTOCELE);  Surgeon: Martina Sinner, MD;  Location: [facility];  Service: Urology;;  with graft  . Vaginal prolapse repair  05/11/2012    Procedure: VAGINAL VAULT SUSPENSION;  Surgeon: Martina Sinner, MD;  Location: [facility];  Service: Urology;;  . Cystoscopy with urethral dilatation  05/11/2012    Procedure: CYSTOSCOPY WITH URETHRAL DILATATION;  Surgeon: Martina Sinner, MD;  Location: [facility];  Service: Urology;;   History   Social History  . Marital Status: Married    Spouse Name: N/A    Number of Children: 3  . Years of Education:  N/A   Occupational History  .      Retired   Social History Main Topics  . Smoking status: Never Smoker   . Smokeless tobacco: Never Used  . Alcohol Use: No  . Drug Use: No  . Sexual Activity: Not Currently    Birth Control/ Protection: Surgical   Other Topics Concern  . Not on file   Social History Narrative  . No narrative on file   Family History  Problem Relation Age of Onset  . Hypertension Mother   . Hypertension Father   . Diabetes Sister   . Cancer Sister   . Hypertension Sister   . Diabetes Brother   . Hypertension Brother   . Hypertension Maternal Aunt   . Hypertension Maternal Uncle   . Hypertension Paternal Aunt   . Cancer Paternal Aunt   . Hypertension Paternal Uncle   . Hypertension Maternal Grandmother   . Hypertension Maternal Grandfather   . Hypertension Paternal Grandmother   . Hypertension Paternal Grandfather    Patient Active Problem List   Diagnosis Date Noted  . Vaginal dryness, menopausal 04/05/2013  . Pronation deformity of ankle, acquired 02/09/2013  . Diabetic neuropathy with neurologic complication  02/09/2013  . Ingrown nail 02/02/2013  . H/O bone density study 01/26/2013  . Chest pain 01/05/2013  . History of iritis 01/04/2013  . GERD (gastroesophageal reflux disease) 10/05/2012  . Type II or unspecified type diabetes mellitus without mention of complication, uncontrolled 10/04/2012  . Essential hypertension, benign 10/04/2012  . Other and unspecified hyperlipidemia 10/04/2012  . 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 Diabetic neuropathy 10/04/2012  . S/P hysterectomy 10/04/2012  . Osteopenia 10/04/2012  . Hypothyroidism 10/04/2012  . History of cystocele 10/04/2012  . Dyspareunia 10/04/2012  . Plantar fasciitis 10/04/2012  . Breast cancer 05/06/2011   Current Outpatient Prescriptions on File Prior to Visit  Medication Sig Dispense Refill  . atorvastatin (LIPITOR) 20 MG tablet Take 20 mg by mouth at bedtime.       .  calcium-vitamin D (OSCAL WITH D) 250-125 MG-UNIT per tablet Take 1 tablet by mouth daily.      . Carboxymethylcellul-Glycerin (OPTIVE SENSITIVE) 0.5-0.9 % SOLN Apply 1 drop to eye daily.      . cycloSPORINE (RESTASIS) 0.05 % ophthalmic emulsion Place 1 drop into both eyes 2 (two) times daily.      Marland Kitchen diltiazem (TIAZAC) 360 MG 24 hr capsule Take 360 mg by mouth every morning.       Marland Kitchen esomeprazole (NEXIUM) 40 MG capsule Take 40 mg by mouth daily before breakfast.      . fexofenadine ([name]) 180 MG tablet Take 180 mg by mouth every morning.       . fish oil-omega-3 fatty acids 1000 MG capsule Take 1 g by mouth daily.       . irbesartan (AVAPRO) 150 MG tablet Take 0.5 tablets (75 mg total) by mouth at bedtime.  45 tablet  1  . levothyroxine (SYNTHROID, LEVOTHROID) 25 MCG tablet TAKE 1 TABLET (25 MCG TOTAL) BY MOUTH DAILY BEFORE BREAKFAST.  90 tablet  3  . LYRICA 50 MG capsule Take 50 mg by mouth. One tab at midday and two tabs at night      . metFORMIN (GLUCOPHAGE) 500 MG tablet Take 1.5 tablets twice a day - morning and bedtime  90 tablet  3  . metoprolol succinate (TOPROL-XL) 25 MG 24 hr tablet Take 25 mg by mouth 2 times daily at 12 noon and 4 pm.       . Multiple Vitamins-Minerals (MULTIVITAMIN WITH MINERALS) tablet Take 1 tablet by mouth daily.      Marland Kitchen NASONEX 50 MCG/ACT nasal spray       . PREMARIN vaginal cream Place 1 mg vaginally once a week. Take one ounce      . spironolactone (ALDACTONE) 25 MG tablet Take 37.5 mg by mouth every morning.       . vitamin E 1000 UNIT capsule Take 1,000 Units by mouth daily.      Marland Kitchen lamoTRIgine (LAMICTAL) 200 MG tablet       . loteprednol (LOTEMAX) 0.5 % ophthalmic suspension Place 1 drop into the left eye daily.        No current facility-administered medications on file prior to visit.           Review of Systems See hPI    Objective:   Physical Exam Physical Exam  Nursing note and vitals reviewed.  Constitutional: She is oriented to person,  place, and time. She appears well-developed and well-nourished.  HENT:  Head: Normocephalic and atraumatic.  Cardiovascular: Normal rate and regular rhythm. Exam reveals no gallop and no friction rub.  No murmur  heard.  Pulmonary/Chest: Breath sounds normal. She has no wheezes. She has no rales.  Neurological: She is alert and oriented to person, place, and time.  Skin: Skin is warm and dry.  Foot exam.  Good pedal pulses,   Monofilament slightly decreased sensation bilateral toes.  No rashes no ulcerations Psychiatric: She has a normal mood and affect. Her behavior is normal.             Assessment & Plan:  Diabetes  Check AIC today,  On statin on ARB,  Foot exam today  Ad vised  Eye exam  Diabetic neuropathy  On Lyrica  And also complicated by tarsal surgery.    htn continue meds  Atrophic vaginitis  On Premarin per urologist    Historyof facial zoster  Advised  Zostavax

## 2013-04-07 ENCOUNTER — Encounter: Payer: Self-pay | Admitting: *Deleted

## 2013-04-08 ENCOUNTER — Ambulatory Visit (INDEPENDENT_AMBULATORY_CARE_PROVIDER_SITE_OTHER): Payer: BLUE CROSS/BLUE SHIELD | Admitting: Podiatry

## 2013-04-08 ENCOUNTER — Encounter: Payer: Self-pay | Admitting: Podiatry

## 2013-04-08 VITALS — BP 111/68 | HR 65 | Ht 65.0 in | Wt 178.0 lb

## 2013-04-08 DIAGNOSIS — M7751 Other enthesopathy of right foot: Secondary | ICD-10-CM | POA: Insufficient documentation

## 2013-04-08 DIAGNOSIS — G5750 Tarsal tunnel syndrome, unspecified lower limb: Secondary | ICD-10-CM

## 2013-04-08 DIAGNOSIS — M216X9 Other acquired deformities of unspecified foot: Secondary | ICD-10-CM

## 2013-04-08 DIAGNOSIS — G5752 Tarsal tunnel syndrome, left lower limb: Secondary | ICD-10-CM

## 2013-04-08 NOTE — Progress Notes (Signed)
Patient presents complaining of pain on left foot near the course of calcaneal branch from Tarsal Tunnel area.  Patient request injection to the painful area.  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 Started to walk more by exercising at [facility].  Also has right knee problem.  Objective: Vascular status are within normal.  Orthopedic: Elevated first ray with forefoot varus left.  Biomechanics: Upon loading of forefoot, the first ray becomes hypermobile and forefoot is assuming varus position.  Subtalar and ankle joint hyperpronation from weak first ray bilateral right>left.  Dermatologic: Normotrophic skin bilateral.   Assessment: Diabetic neuropathy.  Left foot pain near Tarsal tunnel area secondary to STJ hyperpronation from faulty biomechanics.  Plan:  May benefit from injection and orthotics. Left inferior aspect of Tarsal tunnel area injected with 4mg  of Dexamethasone.

## 2013-04-08 NOTE — Patient Instructions (Signed)
Injection given left heel near ankle area. May benefit from Orthotics.

## 2013-04-25 ENCOUNTER — Ambulatory Visit (INDEPENDENT_AMBULATORY_CARE_PROVIDER_SITE_OTHER): Payer: Medicare HMO | Admitting: Obstetrics & Gynecology

## 2013-04-25 ENCOUNTER — Encounter: Payer: Self-pay | Admitting: Obstetrics & Gynecology

## 2013-04-25 VITALS — BP 123/72 | HR 83 | Ht 67.0 in | Wt 184.9 lb

## 2013-04-25 DIAGNOSIS — Z853 Personal history of malignant neoplasm of breast: Secondary | ICD-10-CM

## 2013-04-25 DIAGNOSIS — N760 Acute vaginitis: Secondary | ICD-10-CM

## 2013-04-25 NOTE — Patient Instructions (Signed)
Vaginitis Vaginitis is an inflammation of the vagina. It is most often caused by a change in the normal balance of the bacteria and yeast that live in the vagina. This change in balance causes an overgrowth of certain bacteria or yeast, which causes the inflammation. There are different types of vaginitis, but the most common types are:  Bacterial vaginosis.  Yeast infection (candidiasis).  Trichomoniasis vaginitis. This is a sexually transmitted infection (STI).  Viral vaginitis.  Atropic vaginitis.  Allergic vaginitis. CAUSES  The cause depends on the type of vaginitis. Vaginitis can be caused by:  Bacteria (bacterial vaginosis).  Yeast (yeast infection).  A parasite (trichomoniasis vaginitis)  A virus (viral vaginitis).  Low hormone levels (atrophic vaginitis). Low hormone levels can occur during pregnancy, breastfeeding, or after menopause.  Irritants, such as bubble baths, scented tampons, and feminine sprays (allergic vaginitis). Other factors can change the normal balance of the yeast and bacteria that live in the vagina. These include:  Antibiotic medicines.  Poor hygiene.  Diaphragms, vaginal sponges, spermicides, birth control pills, and intrauterine devices (IUD).  Sexual intercourse.  Infection.  Uncontrolled diabetes.  A weakened immune system. SYMPTOMS  Symptoms can vary depending on the cause of the vaginitis. Common symptoms include:  Abnormal vaginal discharge.  The discharge is white, gray, or yellow with bacterial vaginosis.  The discharge is thick, white, and cheesy with a yeast infection.  The discharge is frothy and yellow or greenish with trichomoniasis.  A bad vaginal odor.  The odor is fishy with bacterial vaginosis.  Vaginal itching, pain, or swelling.  Painful intercourse.  Pain or burning when urinating. Sometimes, there are no symptoms. TREATMENT  Treatment will vary depending on the type of infection.   Bacterial  vaginosis and trichomoniasis are often treated with antibiotic creams or pills.  Yeast infections are often treated with antifungal medicines, such as vaginal creams or suppositories.  Viral vaginitis has no cure, but symptoms can be treated with medicines that relieve discomfort. Your sexual partner should be treated as well.  Atrophic vaginitis may be treated with an estrogen cream, pill, suppository, or vaginal ring. If vaginal dryness occurs, lubricants and moisturizing creams may help. You may be told to avoid scented soaps, sprays, or douches.  Allergic vaginitis treatment involves quitting the use of the product that is causing the problem. Vaginal creams can be used to treat the symptoms. HOME CARE INSTRUCTIONS   Take all medicines as directed by your caregiver.  Keep your genital area clean and dry. Avoid soap and only rinse the area with water.  Avoid douching. It can remove the healthy bacteria in the vagina.  Do not use tampons or have sexual intercourse until your vaginitis has been treated. Use sanitary pads while you have vaginitis.  Wipe from front to back. This avoids the spread of bacteria from the rectum to the vagina.  Let air reach your genital area.  Wear cotton underwear to decrease moisture buildup.  Avoid wearing underwear while you sleep until your vaginitis is gone.  Avoid tight pants and underwear or nylons without a cotton panel.  Take off wet clothing (especially bathing suits) as soon as possible.  Use mild, non-scented products. Avoid using irritants, such as:  Scented feminine sprays.  Fabric softeners.  Scented detergents.  Scented tampons.  Scented soaps or bubble baths.  Practice safe sex and use condoms. Condoms may prevent the spread of trichomoniasis and viral vaginitis. SEEK MEDICAL CARE IF:   You have abdominal pain.  You   have a fever or persistent symptoms for more than 2 3 days.  You have a fever and your symptoms suddenly  get worse. Document Released: 02/02/2007 Document Revised: 12/31/2011 Document Reviewed: 09/18/2011 Exit[employer] Patient Information 2014 [employer].  

## 2013-04-25 NOTE — Progress Notes (Signed)
Patient reports some vaginal itching. States that she doesn't have discharge.

## 2013-04-25 NOTE — Progress Notes (Signed)
Subjective:     Patient ID: Victoria Walker, female   DOB: 06-12-1950, 63 y.o.   MRN: [id number]  HPI Pt c/o vulvar itching.  She is unsure if it is related to her vaginal atrophy or something else.  She has been seeing a urologist and was prescribed EES cream.  She reports that her oncologist is aware.  She is using a small amount once a week.  She reports that there sx improve after her meds but, then return in 3 days.    She denies new onset discharge.  Also here to have a q 6 months bimanual exam   Review of Systems     Objective:   Physical Exam BP 123/72  Pulse 83  Ht 5\' 7"  (1.702 m)  Wt 184 lb 14.4 oz (83.87 kg)  BMI 28.95 kg/m2  GU: EGBUS: no lesions Vagina: no blood in vault; vaginal mucosa moist. The ext genitalia are dry.  No abnormal discharge Adnexa: no masses; non tender; no masses palpable          Assessment:     Vaginal itching pt currently on Premarin cream.  Her oncologist is aware.  H/o breast cancer >5years- normal adnexa    Plan:     KOH and wet smear F/u 6 months for annual exam

## 2013-04-26 ENCOUNTER — Encounter: Payer: Self-pay | Admitting: Podiatry

## 2013-04-26 ENCOUNTER — Ambulatory Visit (INDEPENDENT_AMBULATORY_CARE_PROVIDER_SITE_OTHER): Payer: Medicare HMO | Admitting: Podiatry

## 2013-04-26 VITALS — BP 128/77 | HR 83

## 2013-04-26 DIAGNOSIS — M216X9 Other acquired deformities of unspecified foot: Secondary | ICD-10-CM

## 2013-04-26 DIAGNOSIS — G5750 Tarsal tunnel syndrome, unspecified lower limb: Secondary | ICD-10-CM

## 2013-04-26 DIAGNOSIS — G5752 Tarsal tunnel syndrome, left lower limb: Secondary | ICD-10-CM

## 2013-04-26 DIAGNOSIS — M21969 Unspecified acquired deformity of unspecified lower leg: Secondary | ICD-10-CM

## 2013-04-26 DIAGNOSIS — E1149 Type 2 diabetes mellitus with other diabetic neurological complication: Secondary | ICD-10-CM

## 2013-04-26 DIAGNOSIS — M722 Plantar fascial fibromatosis: Secondary | ICD-10-CM

## 2013-04-26 LAB — WET PREP, GENITAL
Clue Cells Wet Prep HPF POC: NONE SEEN
Trich, Wet Prep: NONE SEEN
WBC, Wet Prep HPF POC: NONE SEEN
Yeast Wet Prep HPF POC: NONE SEEN

## 2013-04-26 NOTE — Patient Instructions (Signed)
Casted for Orthotics.  Will contact patient once the orthotics are ready.

## 2013-04-26 NOTE — Progress Notes (Signed)
Stated that injection helped. Patient is here to have Orthotics prepared.   Objective: Bilateral X-rays taken. Findings reveal increased lateral deviation of the Calcaneocuboid angle in AP view. Elevated first ray in lateral view was significant.   Assessment: STJ hyperpronation bilateral with Sinus tarsitis left foot. Plantar fasciitis left foot.   Plan: Casted for Orthotics.

## 2013-04-28 ENCOUNTER — Telehealth: Payer: Self-pay | Admitting: *Deleted

## 2013-04-28 ENCOUNTER — Other Ambulatory Visit: Payer: Self-pay | Admitting: *Deleted

## 2013-04-28 MED ORDER — METOPROLOL SUCCINATE ER 25 MG PO TB24: 25.0000 mg | ORAL_TABLET | Freq: Two times a day (BID) | ORAL | Status: DC

## 2013-04-28 MED ORDER — ESOMEPRAZOLE MAGNESIUM 40 MG PO CPDR: 40.0000 mg | DELAYED_RELEASE_CAPSULE | Freq: Every day | ORAL | Status: DC

## 2013-04-28 NOTE — Telephone Encounter (Signed)
Need refills on  esomeprazole (NEXIUM) 40 MG capsule..... Wants 30 day  metoprolol succinate (TOPROL-XL) 25 MG 24 hr tablet..... Wants 90 days  [facility]

## 2013-04-28 NOTE — Telephone Encounter (Signed)
Refill request

## 2013-05-31 ENCOUNTER — Other Ambulatory Visit: Payer: Self-pay | Admitting: *Deleted

## 2013-05-31 MED ORDER — METOPROLOL SUCCINATE ER 25 MG PO TB24
25.0000 mg | ORAL_TABLET | Freq: Two times a day (BID) | ORAL | Status: DC
Start: 2013-05-31 — End: 2013-08-18

## 2013-05-31 NOTE — Telephone Encounter (Signed)
Pharmacy is requesting a 90 day supply

## 2013-06-08 ENCOUNTER — Other Ambulatory Visit: Payer: Self-pay | Admitting: Internal Medicine

## 2013-06-08 NOTE — Telephone Encounter (Signed)
Refill request

## 2013-06-24 ENCOUNTER — Ambulatory Visit (INDEPENDENT_AMBULATORY_CARE_PROVIDER_SITE_OTHER): Payer: Medicare HMO | Admitting: Podiatry

## 2013-06-24 ENCOUNTER — Encounter: Payer: Self-pay | Admitting: Podiatry

## 2013-06-24 VITALS — BP 141/85 | HR 76 | Ht 65.0 in | Wt 178.0 lb

## 2013-06-24 DIAGNOSIS — M722 Plantar fascial fibromatosis: Secondary | ICD-10-CM

## 2013-06-24 DIAGNOSIS — G5790 Unspecified mononeuropathy of unspecified lower limb: Secondary | ICD-10-CM

## 2013-06-24 DIAGNOSIS — M792 Neuralgia and neuritis, unspecified: Secondary | ICD-10-CM | POA: Insufficient documentation

## 2013-06-24 NOTE — Patient Instructions (Signed)
Topical cream prescribed. Pharmacy will contact you for the cream. Return as needed.

## 2013-06-24 NOTE — Progress Notes (Signed)
Follow up on orthotics. Still having pain at medial heel on left foot. This happed after changing shoe without orthotics.  Objective: Burning and shooting type pain is at medial aspect of the left heel.  No other new problems.   Assessment: STJ Pronation deformity. Plantar fasciitis left heel. Neuralgia medial calcaneal nerve left heel.  Plan: Reviewed findings. May benefit from Compounding cream for neuralgic pain.

## 2013-07-12 ENCOUNTER — Ambulatory Visit (INDEPENDENT_AMBULATORY_CARE_PROVIDER_SITE_OTHER): Payer: Medicare HMO | Admitting: Internal Medicine

## 2013-07-12 ENCOUNTER — Encounter: Payer: Self-pay | Admitting: Internal Medicine

## 2013-07-12 VITALS — BP 128/76 | HR 68 | Temp 97.9°F | Resp 18 | Wt 178.0 lb

## 2013-07-12 DIAGNOSIS — M1710 Unilateral primary osteoarthritis, unspecified knee: Secondary | ICD-10-CM

## 2013-07-12 DIAGNOSIS — IMO0001 Reserved for inherently not codable concepts without codable children: Secondary | ICD-10-CM

## 2013-07-12 DIAGNOSIS — E1140 Type 2 diabetes mellitus with diabetic neuropathy, unspecified: Secondary | ICD-10-CM

## 2013-07-12 DIAGNOSIS — M1711 Unilateral primary osteoarthritis, right knee: Secondary | ICD-10-CM

## 2013-07-12 DIAGNOSIS — IMO0002 Reserved for concepts with insufficient information to code with codable children: Secondary | ICD-10-CM

## 2013-07-12 DIAGNOSIS — E1165 Type 2 diabetes mellitus with hyperglycemia: Principal | ICD-10-CM

## 2013-07-12 DIAGNOSIS — E1142 Type 2 diabetes mellitus with diabetic polyneuropathy: Secondary | ICD-10-CM

## 2013-07-12 DIAGNOSIS — E1149 Type 2 diabetes mellitus with other diabetic neurological complication: Secondary | ICD-10-CM

## 2013-07-12 LAB — HEMOGLOBIN A1C
Hgb A1c MFr Bld: 6.7 % — ABNORMAL HIGH (ref <5.7)
Mean Plasma Glucose: 146 mg/dL — ABNORMAL HIGH (ref <117)

## 2013-07-12 MED ORDER — TRAMADOL-ACETAMINOPHEN 37.5-325 MG PO TABS: 1.0000 | ORAL_TABLET | Freq: Four times a day (QID) | ORAL | Status: DC | PRN

## 2013-07-12 NOTE — Patient Instructions (Signed)
Schedule CPE  Labs today

## 2013-07-12 NOTE — Progress Notes (Signed)
Subjective:    Patient ID: Victoria Walker, female    DOB: January 20, 1951, 63 y.o.   MRN: [id number]  HPI Victoria Walker is here for follow up of several issues  Diabetes  Glucoses at home running less than 150.  She is on Gabapentin for her neuropathy.  Dr. Caffie Pinto helping with tarsal tunnel pain  R knee DJD  She has been advised that she needs TKR but she wishes to wait for this.  Pain worse when walking  Diabetic neuropathy  Gabapentin helping    Seh has seen opthalmologist  Allergies  Allergen Reactions  . Penicillins Itching  . Sulfa Antibiotics Itching  . Codeine Anxiety    Jittery   . Vicodin [Hydrocodone-Acetaminophen]    Past Medical History  Diagnosis Date  . Hypertension   . Neuropathy   . High cholesterol   . Hypothyroidism   . Diabetes mellitus without complication     type 2  . GERD (gastroesophageal reflux disease)   . Cancer     breast cancer  . Arthritis     knees, back   Past Surgical History  Procedure Laterality Date  . Thyroid operation  1976    removed tumor  . Abdominal hysterectomy  1988  . Mastectomy  2007    right  . Plantar fascia surgery      right  . Tummy tuck  1986  . Hernia repair  1986  . Cardiac catheterization  1999  . Anterior and posterior repair  05/11/2012    Procedure: ANTERIOR (CYSTOCELE) AND POSTERIOR REPAIR (RECTOCELE);  Surgeon: Reece Packer, MD;  Location: [facility];  Service: Urology;;  with graft  . Vaginal prolapse repair  05/11/2012    Procedure: VAGINAL VAULT SUSPENSION;  Surgeon: Reece Packer, MD;  Location: [facility];  Service: Urology;;  . Cystoscopy with urethral dilatation  05/11/2012    Procedure: CYSTOSCOPY WITH URETHRAL DILATATION;  Surgeon: Reece Packer, MD;  Location: [facility];  Service: Urology;;   History   Social History  . Marital Status: Married    Spouse Name: N/A    Number of Children: 3  . Years of Education: N/A   Occupational History  .      Retired   Social History Main Topics  .  Smoking status: Never Smoker   . Smokeless tobacco: Never Used  . Alcohol Use: No  . Drug Use: No  . Sexual Activity: Not Currently    Birth Control/ Protection: Surgical   Other Topics Concern  . Not on file   Social History Narrative  . No narrative on file   Family History  Problem Relation Age of Onset  . Hypertension Mother   . Hypertension Father   . Diabetes Sister   . Cancer Sister   . Hypertension Sister   . Diabetes Brother   . Hypertension Brother   . Hypertension Maternal Aunt   . Hypertension Maternal Uncle   . Hypertension Paternal Aunt   . Cancer Paternal Aunt   . Hypertension Paternal Uncle   . Hypertension Maternal Grandmother   . Hypertension Maternal Grandfather   . Hypertension Paternal Grandmother   . Hypertension Paternal Grandfather    Patient Active Problem List   Diagnosis Date Noted  . Neuralgia of left foot 06/24/2013  . Tarsal tunnel syndrome 04/08/2013  . Vaginal dryness, menopausal 04/05/2013  . Pronation deformity of ankle, acquired 02/09/2013  . Diabetic neuropathy with neurologic complication 19/50/9326  . Ingrown nail 02/02/2013  . H/O  bone density study 01/26/2013  . Chest pain 01/05/2013  . History of iritis 01/04/2013  . GERD (gastroesophageal reflux disease) 10/05/2012  . Type II or unspecified type diabetes mellitus without mention of complication, uncontrolled 10/04/2012  . Essential hypertension, benign 10/04/2012  . Other and unspecified hyperlipidemia 10/04/2012  . 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 Diabetic neuropathy 10/04/2012  . S/P hysterectomy 10/04/2012  . Osteopenia 10/04/2012  . Hypothyroidism 10/04/2012  . History of cystocele 10/04/2012  . Dyspareunia 10/04/2012  . Plantar fasciitis 10/04/2012  . Breast cancer 05/06/2011   Current Outpatient Prescriptions on File Prior to Visit  Medication Sig Dispense Refill  . atorvastatin (LIPITOR) 20 MG tablet Take 20 mg by mouth at bedtime.       . calcium-vitamin D  (OSCAL WITH D) 250-125 MG-UNIT per tablet Take 1 tablet by mouth daily.      . Carboxymethylcellul-Glycerin (OPTIVE SENSITIVE) 0.5-0.9 % SOLN Apply 1 drop to eye daily.      . Ciclopirox 1 % shampoo       . cycloSPORINE (RESTASIS) 0.05 % ophthalmic emulsion Place 1 drop into both eyes 2 (two) times daily.      Marland Kitchen diltiazem (TIAZAC) 360 MG 24 hr capsule Take 360 mg by mouth every morning.       Marland Kitchen esomeprazole (NEXIUM) 40 MG capsule Take 1 capsule (40 mg total) by mouth daily before breakfast.  30 capsule  1  . fexofenadine ([name]) 180 MG tablet Take 180 mg by mouth every morning.       . fish oil-omega-3 fatty acids 1000 MG capsule Take 1 g by mouth daily.       Marland Kitchen gabapentin (NEURONTIN) 300 MG capsule Take 300 mg by mouth daily.      . irbesartan (AVAPRO) 150 MG tablet Take 0.5 tablets (75 mg total) by mouth at bedtime.  45 tablet  1  . lamoTRIgine (LAMICTAL) 200 MG tablet       . levothyroxine (SYNTHROID, LEVOTHROID) 25 MCG tablet TAKE 1 TABLET (25 MCG TOTAL) BY MOUTH DAILY BEFORE BREAKFAST.  90 tablet  3  . lidocaine-prilocaine (EMLA) cream       . loteprednol (LOTEMAX) 0.5 % ophthalmic suspension Place 1 drop into the left eye daily.       Marland Kitchen LYRICA 50 MG capsule Take 50 mg by mouth. One tab at midday and two tabs at night      . metFORMIN (GLUCOPHAGE) 500 MG tablet TAKE 1.5 TABLETS TWICE A DAY - MORNING AND BEDTIME  90 tablet  3  . metoprolol succinate (TOPROL-XL) 25 MG 24 hr tablet Take 1 tablet (25 mg total) by mouth 2 times daily at 12 noon and 4 pm.  180 tablet  1  . Multiple Vitamins-Minerals (MULTIVITAMIN WITH MINERALS) tablet Take 1 tablet by mouth daily.      Marland Kitchen NASONEX 50 MCG/ACT nasal spray       . PREMARIN vaginal cream Place 1 mg vaginally once a week. Take one ounce      . spironolactone (ALDACTONE) 25 MG tablet Take 37.5 mg by mouth every morning.       . vitamin E 1000 UNIT capsule Take 1,000 Units by mouth daily.       No current facility-administered medications on file  prior to visit.       Review of Systems    see HPI Objective:   Physical Exam  Physical Exam  Nursing note and vitals reviewed.  Constitutional: She is oriented to person,  place, and time. She appears well-developed and well-nourished.  HENT:  Head: Normocephalic and atraumatic.  Cardiovascular: Normal rate and regular rhythm. Exam reveals no gallop and no friction rub.  No murmur heard.  Pulmonary/Chest: Breath sounds normal. She has no wheezes. She has no rales.  Neurological: She is alert and oriented to person, place, and time.  Skin: Skin is warm and dry.  Psychiatric: She has a normal mood and affect. Her behavior is normal.            Assessment & Plan:  Diabetes  Aic today   On ARB statin,  Foot exam  Mild neuropathy  Has seen opthalmologist  Diabetic neuropathy on Gabapentin  .  Care under podiatrist  DJD   Ok for Ultracet prn    See me in 3-4 months  Schedule cpe

## 2013-07-13 ENCOUNTER — Encounter: Payer: Self-pay | Admitting: Internal Medicine

## 2013-07-28 ENCOUNTER — Telehealth: Payer: Self-pay | Admitting: *Deleted

## 2013-07-28 ENCOUNTER — Other Ambulatory Visit: Payer: Self-pay | Admitting: *Deleted

## 2013-08-11 NOTE — Telephone Encounter (Signed)
Error

## 2013-08-18 ENCOUNTER — Other Ambulatory Visit: Payer: Self-pay | Admitting: Internal Medicine

## 2013-09-14 ENCOUNTER — Ambulatory Visit (INDEPENDENT_AMBULATORY_CARE_PROVIDER_SITE_OTHER): Payer: Medicare HMO | Admitting: Internal Medicine

## 2013-09-14 ENCOUNTER — Encounter: Payer: Self-pay | Admitting: Internal Medicine

## 2013-09-14 VITALS — BP 117/68 | HR 75 | Temp 98.3°F | Resp 18 | Ht 65.0 in | Wt 179.0 lb

## 2013-09-14 DIAGNOSIS — N9089 Other specified noninflammatory disorders of vulva and perineum: Secondary | ICD-10-CM

## 2013-09-14 DIAGNOSIS — E1165 Type 2 diabetes mellitus with hyperglycemia: Secondary | ICD-10-CM

## 2013-09-14 DIAGNOSIS — IMO0001 Reserved for inherently not codable concepts without codable children: Secondary | ICD-10-CM

## 2013-09-14 MED ORDER — METFORMIN HCL ER 750 MG PO TB24: ORAL_TABLET | ORAL | Status: DC

## 2013-09-14 MED ORDER — METRONIDAZOLE 0.75 % VA GEL: 1.0000 | Freq: Two times a day (BID) | VAGINAL | Status: DC

## 2013-09-14 NOTE — Progress Notes (Signed)
Subjective:    Patient ID: Victoria Walker, female    DOB: Jun 23, 1950, 63 y.o.   MRN: [id number]  HPI  Victoria Walker is here for acute visit.   She has been urinating a lot no dysuria  No CVA pain    She does have "burning" on outside of vagina no burining  She also notes stomach upset and intermittant diarrhea after taking Metformin.    She is on 750 mg bid  She would like to try the XL preparation  Allergies  Allergen Reactions  . Penicillins Itching  . Sulfa Antibiotics Itching  . Codeine Anxiety    Jittery   . Vicodin [Hydrocodone-Acetaminophen]    Past Medical History  Diagnosis Date  . Hypertension   . Neuropathy   . High cholesterol   . Hypothyroidism   . Diabetes mellitus without complication     type 2  . GERD (gastroesophageal reflux disease)   . Cancer     breast cancer  . Arthritis     knees, back   Past Surgical History  Procedure Laterality Date  . Thyroid operation  1976    removed tumor  . Abdominal hysterectomy  1988  . Mastectomy  2007    right  . Plantar fascia surgery      right  . Tummy tuck  1986  . Hernia repair  1986  . Cardiac catheterization  1999  . Anterior and posterior repair  05/11/2012    Procedure: ANTERIOR (CYSTOCELE) AND POSTERIOR REPAIR (RECTOCELE);  Surgeon: Reece Packer, MD;  Location: [facility];  Service: Urology;;  with graft  . Vaginal prolapse repair  05/11/2012    Procedure: VAGINAL VAULT SUSPENSION;  Surgeon: Reece Packer, MD;  Location: [facility];  Service: Urology;;  . Cystoscopy with urethral dilatation  05/11/2012    Procedure: CYSTOSCOPY WITH URETHRAL DILATATION;  Surgeon: Reece Packer, MD;  Location: [facility];  Service: Urology;;   History   Social History  . Marital Status: Married    Spouse Name: N/A    Number of Children: 3  . Years of Education: N/A   Occupational History  .      Retired   Social History Main Topics  . Smoking status: Never Smoker   . Smokeless tobacco: Never Used  . Alcohol  Use: No  . Drug Use: No  . Sexual Activity: Not Currently    Birth Control/ Protection: Surgical   Other Topics Concern  . Not on file   Social History Narrative  . No narrative on file   Family History  Problem Relation Age of Onset  . Hypertension Mother   . Hypertension Father   . Diabetes Sister   . Cancer Sister   . Hypertension Sister   . Diabetes Brother   . Hypertension Brother   . Hypertension Maternal Aunt   . Hypertension Maternal Uncle   . Hypertension Paternal Aunt   . Cancer Paternal Aunt   . Hypertension Paternal Uncle   . Hypertension Maternal Grandmother   . Hypertension Maternal Grandfather   . Hypertension Paternal Grandmother   . Hypertension Paternal Grandfather    Patient Active Problem List   Diagnosis Date Noted  . Right knee DJD 07/12/2013  . Neuralgia of left foot 06/24/2013  . Tarsal tunnel syndrome 04/08/2013  . Vaginal dryness, menopausal 04/05/2013  . Pronation deformity of ankle, acquired 02/09/2013  . Diabetic neuropathy with neurologic complication 25/95/6387  . Ingrown nail 02/02/2013  . H/O bone density study  01/26/2013  . Chest pain 01/05/2013  . History of iritis 01/04/2013  . GERD (gastroesophageal reflux disease) 10/05/2012  . Type II or unspecified type diabetes mellitus without mention of complication, uncontrolled 10/04/2012  . Essential hypertension, benign 10/04/2012  . Other and unspecified hyperlipidemia 10/04/2012  . 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 Diabetic neuropathy 10/04/2012  . S/P hysterectomy 10/04/2012  . Osteopenia 10/04/2012  . Hypothyroidism 10/04/2012  . History of cystocele 10/04/2012  . Dyspareunia 10/04/2012  . Plantar fasciitis 10/04/2012  . Breast cancer 05/06/2011   Current Outpatient Prescriptions on File Prior to Visit  Medication Sig Dispense Refill  . atorvastatin (LIPITOR) 20 MG tablet Take 20 mg by mouth at bedtime.       . calcium-vitamin D (OSCAL WITH D) 250-125 MG-UNIT per tablet  Take 1 tablet by mouth daily.      . Carboxymethylcellul-Glycerin (OPTIVE SENSITIVE) 0.5-0.9 % SOLN Apply 1 drop to eye daily.      . cycloSPORINE (RESTASIS) 0.05 % ophthalmic emulsion Place 1 drop into both eyes 2 (two) times daily.      Marland Kitchen diltiazem (TIAZAC) 360 MG 24 hr capsule Take 360 mg by mouth every morning.       Marland Kitchen esomeprazole (NEXIUM) 40 MG capsule Take 1 capsule (40 mg total) by mouth daily before breakfast.  30 capsule  1  . fexofenadine ([name]) 180 MG tablet Take 180 mg by mouth every morning.       . gabapentin (NEURONTIN) 300 MG capsule Take 300 mg by mouth daily.      . irbesartan (AVAPRO) 150 MG tablet Take 0.5 tablets (75 mg total) by mouth at bedtime.  45 tablet  1  . lamoTRIgine (LAMICTAL) 200 MG tablet       . levothyroxine (SYNTHROID, LEVOTHROID) 25 MCG tablet TAKE 1 TABLET (25 MCG TOTAL) BY MOUTH DAILY BEFORE BREAKFAST.  90 tablet  3  . loteprednol (LOTEMAX) 0.5 % ophthalmic suspension Place 1 drop into the left eye daily.       . metoprolol succinate (TOPROL-XL) 25 MG 24 hr tablet TAKE 1 TABLET BY MOUTH TWICE A DAY AT 12 NOON AND 4PM  60 tablet  2  . Multiple Vitamins-Minerals (MULTIVITAMIN WITH MINERALS) tablet Take 1 tablet by mouth daily.      Marland Kitchen NASONEX 50 MCG/ACT nasal spray       . PREMARIN vaginal cream Place 1 mg vaginally once a week. Take one ounce      . spironolactone (ALDACTONE) 25 MG tablet Take 37.5 mg by mouth every morning.       . traMADol-acetaminophen (ULTRACET) 37.5-325 MG per tablet Take 1 tablet by mouth every 6 (six) hours as needed.  20 tablet  0  . vitamin E 1000 UNIT capsule Take 1,000 Units by mouth daily.      . Ciclopirox 1 % shampoo       . fish oil-omega-3 fatty acids 1000 MG capsule Take 1 g by mouth daily.        No current facility-administered medications on file prior to visit.      Review of Systems See HPI    Objective:   Physical Exam Physical Exam  Nursing note and vitals reviewed.  Constitutional: She is oriented to  person, place, and time. She appears well-developed and well-nourished.  HENT:  Head: Normocephalic and atraumatic.  Cardiovascular: Normal rate and regular rhythm. Exam reveals no gallop and no friction rub.  No murmur heard.  Pulmonary/Chest: Breath sounds normal. She has  no wheezes. She has no rales.  Pelvic: ext genitalia  Slightly red  But I seen no rash or lesion on labia or externally  Vagina  No discharge   Neurological: She is alert and oriented to person, place, and time.  Skin: Skin is warm and dry.  Psychiatric: She has a normal mood and affect. Her behavior is normal.            Assessment & Plan:  Vaginal irritation:  Ok to try Metrogel  .075%   For 10 days  Diabetes  Will change to Glucophage XR  750 mg one at breakfast and one at lunch    Keep June appt with me

## 2013-09-14 NOTE — Patient Instructions (Signed)
Use metformin  XR  One tablet in am and one tablet at lunch  Check glucoses in am and at bedtime 3 times a week and bring to next visit    Use vaginal medicine twice a day for 10 days

## 2013-09-21 ENCOUNTER — Encounter: Payer: Self-pay | Admitting: Podiatry

## 2013-09-21 ENCOUNTER — Ambulatory Visit (INDEPENDENT_AMBULATORY_CARE_PROVIDER_SITE_OTHER): Payer: Medicare HMO | Admitting: Podiatry

## 2013-09-21 VITALS — BP 118/74 | HR 77

## 2013-09-21 DIAGNOSIS — E1140 Type 2 diabetes mellitus with diabetic neuropathy, unspecified: Secondary | ICD-10-CM

## 2013-09-21 DIAGNOSIS — M216X9 Other acquired deformities of unspecified foot: Secondary | ICD-10-CM

## 2013-09-21 DIAGNOSIS — E1149 Type 2 diabetes mellitus with other diabetic neurological complication: Secondary | ICD-10-CM

## 2013-09-21 DIAGNOSIS — G5750 Tarsal tunnel syndrome, unspecified lower limb: Secondary | ICD-10-CM

## 2013-09-21 DIAGNOSIS — E1142 Type 2 diabetes mellitus with diabetic polyneuropathy: Secondary | ICD-10-CM

## 2013-09-21 NOTE — Progress Notes (Signed)
Subjective: 63 year old female presents to prepare for Diabetic shoes. Orthotics failed to improve her foot pain.   Objective:  Burning and shooting type pain is at medial aspect of the left heel.  No other new problems.   Assessment: STJ Pronation deformity.  Plantar fasciitis left heel.  Neuralgia medial calcaneal nerve left heel.   Plan: Both feet measured for diabetic shoes.

## 2013-09-21 NOTE — Patient Instructions (Signed)
Both feet measured for diabetic shoes. 

## 2013-09-26 ENCOUNTER — Ambulatory Visit: Payer: Medicare HMO | Admitting: Internal Medicine

## 2013-10-02 ENCOUNTER — Other Ambulatory Visit: Payer: Self-pay | Admitting: Internal Medicine

## 2013-10-03 NOTE — Telephone Encounter (Signed)
Please see Refill Request below   Requested Medications     Medication name:  Name from pharmacy:  metFORMIN (GLUCOPHAGE) 500 MG tablet  METFORMIN HCL 500 MG TABLET    The source prescription has been discontinued.    Sig: TAKE 1.5 TABLETS TWICE A DAY - MORNING AND BEDTIME    Dispense: 90 tablet Refills: 3 Start: 10/02/2013  Class: Normal    Requested on: 06/08/2013    Originally ordered on: 05/05/2011 Last refill: 09/03/2013 Order History and Details

## 2013-10-11 ENCOUNTER — Encounter: Payer: Self-pay | Admitting: Internal Medicine

## 2013-10-11 ENCOUNTER — Ambulatory Visit (INDEPENDENT_AMBULATORY_CARE_PROVIDER_SITE_OTHER): Payer: Medicare HMO | Admitting: Internal Medicine

## 2013-10-11 VITALS — BP 120/83 | HR 80 | Resp 16 | Ht 65.0 in | Wt 178.0 lb

## 2013-10-11 DIAGNOSIS — E1149 Type 2 diabetes mellitus with other diabetic neurological complication: Secondary | ICD-10-CM

## 2013-10-11 DIAGNOSIS — E038 Other specified hypothyroidism: Secondary | ICD-10-CM

## 2013-10-11 DIAGNOSIS — I10 Essential (primary) hypertension: Secondary | ICD-10-CM

## 2013-10-11 DIAGNOSIS — E1142 Type 2 diabetes mellitus with diabetic polyneuropathy: Secondary | ICD-10-CM

## 2013-10-11 LAB — HEMOGLOBIN A1C
Hgb A1c MFr Bld: 6.5 % — ABNORMAL HIGH (ref <5.7)
Mean Plasma Glucose: 140 mg/dL — ABNORMAL HIGH (ref <117)

## 2013-10-11 NOTE — Patient Instructions (Signed)
See me in 3-4 months 

## 2013-10-11 NOTE — Progress Notes (Signed)
Subjective:    Patient ID: Victoria Walker, female    DOB: 1951/01/06, 63 y.o.   MRN: [id number]  HPI Victoria Walker is here to follow up on Diabetes,  HTN, and hypothyroidism.  DM  Last AIC great.  She was having stomach upset with short acting metformin and she is now on long acting preparation.  She feels much better.  She brings log of FBS they have ranged from 65 to 139.  She has seen podiatrist for her polyneuropathy and tarsal tunnel syndrome.    She reports the topical metrogel has helped with labial itching  All symptoms have resolved  Allergies  Allergen Reactions  . Penicillins Itching  . Sulfa Antibiotics Itching  . Codeine Anxiety    Jittery   . Vicodin [Hydrocodone-Acetaminophen]    Past Medical History  Diagnosis Date  . Hypertension   . Neuropathy   . High cholesterol   . Hypothyroidism   . Diabetes mellitus without complication     type 2  . GERD (gastroesophageal reflux disease)   . Cancer     breast cancer  . Arthritis     knees, back   Past Surgical History  Procedure Laterality Date  . Thyroid operation  1976    removed tumor  . Abdominal hysterectomy  1988  . Mastectomy  2007    right  . Plantar fascia surgery      right  . Tummy tuck  1986  . Hernia repair  1986  . Cardiac catheterization  1999  . Anterior and posterior repair  05/11/2012    Procedure: ANTERIOR (CYSTOCELE) AND POSTERIOR REPAIR (RECTOCELE);  Surgeon: Reece Packer, MD;  Location: [facility];  Service: Urology;;  with graft  . Vaginal prolapse repair  05/11/2012    Procedure: VAGINAL VAULT SUSPENSION;  Surgeon: Reece Packer, MD;  Location: [facility];  Service: Urology;;  . Cystoscopy with urethral dilatation  05/11/2012    Procedure: CYSTOSCOPY WITH URETHRAL DILATATION;  Surgeon: Reece Packer, MD;  Location: [facility];  Service: Urology;;   History   Social History  . Marital Status: Married    Spouse Name: N/A    Number of Children: 3  . Years of Education: N/A    Occupational History  .      Retired   Social History Main Topics  . Smoking status: Never Smoker   . Smokeless tobacco: Never Used  . Alcohol Use: No  . Drug Use: No  . Sexual Activity: Not Currently    Birth Control/ Protection: Surgical   Other Topics Concern  . Not on file   Social History Narrative  . No narrative on file   Family History  Problem Relation Age of Onset  . Hypertension Mother   . Hypertension Father   . Diabetes Sister   . Cancer Sister   . Hypertension Sister   . Diabetes Brother   . Hypertension Brother   . Hypertension Maternal Aunt   . Hypertension Maternal Uncle   . Hypertension Paternal Aunt   . Cancer Paternal Aunt   . Hypertension Paternal Uncle   . Hypertension Maternal Grandmother   . Hypertension Maternal Grandfather   . Hypertension Paternal Grandmother   . Hypertension Paternal Grandfather    Patient Active Problem List   Diagnosis Date Noted  . Right knee DJD 07/12/2013  . Neuralgia of left foot 06/24/2013  . Tarsal tunnel syndrome 04/08/2013  . Vaginal dryness, menopausal 04/05/2013  . Pronation deformity of ankle, acquired  02/09/2013  . Diabetic neuropathy with neurologic complication 14/48/1856  . Ingrown nail 02/02/2013  . H/O bone density study 01/26/2013  . Chest pain 01/05/2013  . History of iritis 01/04/2013  . GERD (gastroesophageal reflux disease) 10/05/2012  . Type II or unspecified type diabetes mellitus without mention of complication, uncontrolled 10/04/2012  . Essential hypertension, benign 10/04/2012  . Other and unspecified hyperlipidemia 10/04/2012  . 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 Diabetic neuropathy 10/04/2012  . S/P hysterectomy 10/04/2012  . Osteopenia 10/04/2012  . Hypothyroidism 10/04/2012  . History of cystocele 10/04/2012  . Dyspareunia 10/04/2012  . Plantar fasciitis 10/04/2012  . Breast cancer 05/06/2011   Current Outpatient Prescriptions on File Prior to Visit  Medication Sig  Dispense Refill  . atorvastatin (LIPITOR) 20 MG tablet Take 20 mg by mouth at bedtime.       . calcium-vitamin D (OSCAL WITH D) 250-125 MG-UNIT per tablet Take 1 tablet by mouth daily.      . Carboxymethylcellul-Glycerin (OPTIVE SENSITIVE) 0.5-0.9 % SOLN Apply 1 drop to eye daily.      . Ciclopirox 1 % shampoo       . cycloSPORINE (RESTASIS) 0.05 % ophthalmic emulsion Place 1 drop into both eyes 2 (two) times daily.      Marland Kitchen diltiazem (TIAZAC) 360 MG 24 hr capsule Take 360 mg by mouth every morning.       Marland Kitchen esomeprazole (NEXIUM) 40 MG capsule Take 1 capsule (40 mg total) by mouth daily before breakfast.  30 capsule  1  . fexofenadine ([name]) 180 MG tablet Take 180 mg by mouth every morning.       . fish oil-omega-3 fatty acids 1000 MG capsule Take 1 g by mouth daily.       Marland Kitchen gabapentin (NEURONTIN) 300 MG capsule Take 300 mg by mouth daily.      . irbesartan (AVAPRO) 150 MG tablet Take 0.5 tablets (75 mg total) by mouth at bedtime.  45 tablet  1  . lamoTRIgine (LAMICTAL) 200 MG tablet       . levothyroxine (SYNTHROID, LEVOTHROID) 25 MCG tablet TAKE 1 TABLET (25 MCG TOTAL) BY MOUTH DAILY BEFORE BREAKFAST.  90 tablet  3  . loteprednol (LOTEMAX) 0.5 % ophthalmic suspension Place 1 drop into the left eye daily.       . metFORMIN (GLUCOPHAGE) 500 MG tablet TAKE 1.5 TABLETS TWICE A DAY - MORNING AND BEDTIME  90 tablet  0  . metFORMIN (GLUCOPHAGE-XR) 750 MG 24 hr tablet Take one tablet in am and one tablet at lunch  60 tablet  1  . metoprolol succinate (TOPROL-XL) 25 MG 24 hr tablet TAKE 1 TABLET BY MOUTH TWICE A DAY AT 12 NOON AND 4PM  60 tablet  2  . metroNIDAZOLE (METROGEL VAGINAL) 0.75 % vaginal gel Place 1 Applicatorful vaginally 2 (two) times daily. Apply vaginally and externally twice daily for 10 days  70 g  0  . Multiple Vitamins-Minerals (MULTIVITAMIN WITH MINERALS) tablet Take 1 tablet by mouth daily.      Marland Kitchen NASONEX 50 MCG/ACT nasal spray       . PREMARIN vaginal cream Place 1 mg vaginally  once a week. Take one ounce      . spironolactone (ALDACTONE) 25 MG tablet Take 37.5 mg by mouth every morning.       . traMADol-acetaminophen (ULTRACET) 37.5-325 MG per tablet Take 1 tablet by mouth every 6 (six) hours as needed.  20 tablet  0  . vitamin E 1000  UNIT capsule Take 1,000 Units by mouth daily.       No current facility-administered medications on file prior to visit.       Review of Systems See hPI    Objective:   Physical Exam Physical Exam  Nursing note and vitals reviewed.  Constitutional: She is oriented to person, place, and time. She appears well-developed and well-nourished.  HENT:  Head: Normocephalic and atraumatic.  Cardiovascular: Normal rate and regular rhythm. Exam reveals no gallop and no friction rub.  No murmur heard.  Pulmonary/Chest: Breath sounds normal. She has no wheezes. She has no rales.  Neurological: She is alert and oriented to person, place, and time.  Skin: Skin is warm and dry.  Foot exam  No rashes or ulcerations.  Pos bilateral pedal pulses.  Normal exam to microfilament testing Psychiatric: She has a normal mood and affect. Her behavior is normal.         Assessment & Plan:  DM  Continue meds,  aic today  Foot exam normal  Eye exam done  HTN:  Continue meds  Hyperlipdemia  Will recheck today    Hypothyroidism  Continue meds  See me in 3-4 months

## 2013-10-12 ENCOUNTER — Other Ambulatory Visit: Payer: Self-pay | Admitting: *Deleted

## 2013-10-12 ENCOUNTER — Telehealth: Payer: Self-pay | Admitting: *Deleted

## 2013-10-12 DIAGNOSIS — R7989 Other specified abnormal findings of blood chemistry: Secondary | ICD-10-CM

## 2013-10-12 LAB — LIPID PANEL
Cholesterol: 181 mg/dL (ref 0–200)
HDL: 44 mg/dL (ref >39)
LDL Cholesterol: 86 mg/dL (ref 0–99)
Total CHOL/HDL Ratio: 4.1 Ratio
Triglycerides: 255 mg/dL — ABNORMAL HIGH (ref <150)
VLDL: 51 mg/dL — ABNORMAL HIGH (ref 0–40)

## 2013-10-12 LAB — COMPREHENSIVE METABOLIC PANEL
ALT: 26 U/L (ref 0–35)
AST: 20 U/L (ref 0–37)
Albumin: 4.6 g/dL (ref 3.5–5.2)
Alkaline Phosphatase: 63 U/L (ref 39–117)
BUN: 14 mg/dL (ref 6–23)
CO2: 25 mEq/L (ref 19–32)
Calcium: 10.2 mg/dL (ref 8.4–10.5)
Chloride: 103 mEq/L (ref 96–112)
Creat: 0.89 mg/dL (ref 0.50–1.10)
Glucose, Bld: 148 mg/dL — ABNORMAL HIGH (ref 70–99)
Potassium: 4.4 mEq/L (ref 3.5–5.3)
Sodium: 140 mEq/L (ref 135–145)
Total Bilirubin: 0.3 mg/dL (ref 0.2–1.2)
Total Protein: 6.5 g/dL (ref 6.0–8.3)

## 2013-10-12 LAB — TSH: TSH: 0.264 u[IU]/mL — ABNORMAL LOW (ref 0.350–4.500)

## 2013-10-12 NOTE — Telephone Encounter (Signed)
Message copied by Gretchen Short on Wed Oct 12, 2013  1:06 PM ------      Message from: Emi Belfast D      Created: Wed Oct 12, 2013  1:01 PM       Many add a free T3 and a free T4 to these labs ------

## 2013-10-13 ENCOUNTER — Other Ambulatory Visit: Payer: Self-pay | Admitting: Orthopedic Surgery

## 2013-10-13 DIAGNOSIS — M25561 Pain in right knee: Secondary | ICD-10-CM

## 2013-10-20 ENCOUNTER — Other Ambulatory Visit: Payer: Self-pay | Admitting: *Deleted

## 2013-10-20 ENCOUNTER — Encounter: Payer: Self-pay | Admitting: *Deleted

## 2013-10-20 DIAGNOSIS — R7989 Other specified abnormal findings of blood chemistry: Secondary | ICD-10-CM

## 2013-10-23 ENCOUNTER — Ambulatory Visit
Admission: RE | Admit: 2013-10-23 | Discharge: 2013-10-23 | Disposition: A | Discharge status: Home / Self Care | Payer: Medicare HMO | Source: Ambulatory Visit | Attending: Orthopedic Surgery | Admitting: Orthopedic Surgery

## 2013-10-23 DIAGNOSIS — M25561 Pain in right knee: Secondary | ICD-10-CM

## 2013-10-26 ENCOUNTER — Telehealth: Payer: Self-pay | Admitting: *Deleted

## 2013-10-26 ENCOUNTER — Other Ambulatory Visit: Payer: Self-pay | Admitting: Internal Medicine

## 2013-10-26 ENCOUNTER — Other Ambulatory Visit: Payer: Self-pay | Admitting: *Deleted

## 2013-10-26 DIAGNOSIS — R7989 Other specified abnormal findings of blood chemistry: Secondary | ICD-10-CM

## 2013-10-26 LAB — T4, FREE: Free T4: 0.98 ng/dL (ref 0.80–1.80)

## 2013-10-26 LAB — T3, FREE: T3, Free: 3.2 pg/mL (ref 2.3–4.2)

## 2013-10-26 NOTE — Telephone Encounter (Signed)
ERROR

## 2013-11-02 ENCOUNTER — Telehealth: Payer: Self-pay | Admitting: Internal Medicine

## 2013-11-02 DIAGNOSIS — R7989 Other specified abnormal findings of blood chemistry: Secondary | ICD-10-CM

## 2013-11-02 DIAGNOSIS — E049 Nontoxic goiter, unspecified: Secondary | ICD-10-CM

## 2013-11-02 NOTE — Telephone Encounter (Signed)
Upon review of record    Pt has thyroid U/S in 2006 with multiple solid/cystic nodules.   Will repeat ultrasound and have referral to Dr. Keane Police with pt and she voices understanding.  Will hold on nuclear medicine scan for now

## 2013-11-02 NOTE — Telephone Encounter (Signed)
Spoke with pt and informed of thyroid blood work.   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

## 2013-11-04 ENCOUNTER — Ambulatory Visit (HOSPITAL_BASED_OUTPATIENT_CLINIC_OR_DEPARTMENT_OTHER)
Admission: RE | Admit: 2013-11-04 | Discharge: 2013-11-04 | Disposition: A | Discharge status: Home / Self Care | Payer: Medicare HMO | Source: Ambulatory Visit | Attending: Internal Medicine | Admitting: Internal Medicine

## 2013-11-04 DIAGNOSIS — E119 Type 2 diabetes mellitus without complications: Secondary | ICD-10-CM | POA: Insufficient documentation

## 2013-11-04 DIAGNOSIS — R599 Enlarged lymph nodes, unspecified: Secondary | ICD-10-CM | POA: Insufficient documentation

## 2013-11-04 DIAGNOSIS — Z853 Personal history of malignant neoplasm of breast: Secondary | ICD-10-CM | POA: Insufficient documentation

## 2013-11-04 DIAGNOSIS — E042 Nontoxic multinodular goiter: Secondary | ICD-10-CM | POA: Insufficient documentation

## 2013-11-04 DIAGNOSIS — E049 Nontoxic goiter, unspecified: Secondary | ICD-10-CM

## 2013-11-06 ENCOUNTER — Other Ambulatory Visit: Payer: Self-pay | Admitting: Internal Medicine

## 2013-11-07 ENCOUNTER — Encounter: Payer: Self-pay | Admitting: Internal Medicine

## 2013-11-07 ENCOUNTER — Other Ambulatory Visit: Payer: Self-pay | Admitting: Internal Medicine

## 2013-11-07 ENCOUNTER — Ambulatory Visit (INDEPENDENT_AMBULATORY_CARE_PROVIDER_SITE_OTHER): Payer: Medicare HMO | Admitting: Internal Medicine

## 2013-11-07 VITALS — BP 116/64 | HR 75 | Temp 98.3°F | Resp 12 | Ht 65.0 in | Wt 179.0 lb

## 2013-11-07 DIAGNOSIS — E042 Nontoxic multinodular goiter: Secondary | ICD-10-CM

## 2013-11-07 DIAGNOSIS — E039 Hypothyroidism, unspecified: Secondary | ICD-10-CM

## 2013-11-07 NOTE — Telephone Encounter (Signed)
Requested Medications     Medication name:  Name from pharmacy:  metFORMIN (GLUCOPHAGE-XR) 750 MG 24 hr tablet  METFORMIN HCL ER 750 MG TABLET    Sig: TAKE 1 TABLET IN AM AND 1 TABLET AT LUNCH    Dispense: 60 tablet Refills: 1 Start: 11/06/2013  Class: Normal    Requested on: 09/14/2013    Originally ordered on: 09/14/2013 Last refill: 10/10/2013 Order History and Details

## 2013-11-07 NOTE — Patient Instructions (Signed)
Please stop at the lab. I will send you the results through [facility]. You will be called with the biopsy schedule. Please return in 1 year.  Thyroid Biopsy The thyroid gland is a butterfly-shaped gland situated in the front of the neck. It produces hormones which affect metabolism, growth and development, and body temperature. A thyroid biopsy is a procedure in which small samples of tissue or fluid are removed from the thyroid gland or mass and examined under a microscope. This test is done to determine the cause of thyroid problems, such as infection, cancer, or other thyroid problems. There are 2 ways to obtain samples: 1. Fine needle biopsy. Samples are removed using a thin needle inserted through the skin and into the thyroid gland or mass. 2. Open biopsy. Samples are removed after a cut (incision) is made through the skin. LET YOUR CAREGIVER KNOW ABOUT:   Allergies.  Medications taken including herbs, eye drops, over-the-counter medications, and creams.  Use of steroids (by mouth or creams).  Previous problems with anesthetics or numbing medicine.  Possibility of pregnancy, if this applies.  History of blood clots (thrombophlebitis).  History of bleeding or blood problems.  Previous surgery.  Other health problems. RISKS AND COMPLICATIONS  Bleeding from the site. The risk of bleeding is higher if you have a bleeding disorder or are taking any blood thinning medications (anticoagulants).  Infection.  Injury to structures near the thyroid gland. BEFORE THE PROCEDURE  This is a procedure that can be done as an outpatient. Confirm the time that you need to arrive for your procedure. Confirm whether there is a need to fast or withhold any medications. A blood sample may be done to determine your blood clotting time. Medicine may be given to help you relax (sedative). PROCEDURE Fine needle biopsy. You will be awake during the procedure. You may be asked to lie on your back with  your head tipped backward to extend your neck. Let your caregiver know if you cannot tolerate the positioning. An area on your neck will be cleansed. A needle is inserted through the skin of your neck. You may feel a mild discomfort during this procedure. You may be asked to avoid coughing, talking, swallowing, or making sounds during some portions of the procedure. The needle is withdrawn once tissue or fluid samples have been removed. Pressure may be applied to the neck to reduce swelling and ensure that bleeding has stopped. The samples will be sent for examination.  Open biopsy. You will be given general anesthesia. You will be asleep during the procedure. An incision is made in your neck. A sample of thyroid tissue or the mass is removed. The tissue sample or mass will be sent for examination. The sample or mass may be examined during the biopsy. If the sample or mass contains cancer cells, some or all of the thyroid gland may be removed. The incision is closed with stitches. AFTER THE PROCEDURE  Your recovery will be assessed and monitored. If there are no problems, as an outpatient, you should be able to go home shortly after the procedure. If you had a fine needle biopsy:  You may have soreness at the biopsy site for 1 to 2 days. If you had an open biopsy:   You may have soreness at the biopsy site for 3 to 4 days.  You may have a hoarse voice or sore throat for 1 to 2 days. Obtaining the Test Results It is your responsibility to obtain your test  results. Do not assume everything is normal if you have not heard from your caregiver or the medical facility. It is important for you to follow up on all of your test results. HOME CARE INSTRUCTIONS   Keeping your head raised on a pillow when you are lying down may ease biopsy site discomfort.  Supporting the back of your head and neck with both hands as you sit up from a lying position may ease biopsy site discomfort.  Only take  over-the-counter or prescription medicines for pain, discomfort, or fever as directed by your caregiver.  Throat lozenges or gargling with warm salt water may help to soothe a sore throat. SEEK IMMEDIATE MEDICAL CARE IF:   You have severe bleeding from the biopsy site.  You have difficulty swallowing.  You have a fever.  You have increased pain, swelling, redness, or warmth at the biopsy site.  You notice pus coming from the biopsy site.  You have swollen glands (lymph nodes) in your neck. Document Released: 02/02/2007 Document Revised: 08/02/2012 Document Reviewed: 07/05/2008 [facility] Patient Information 2015 [place], [place]. This information is not intended to replace advice given to you by your health care provider. Make sure you discuss any questions you have with your health care provider.

## 2013-11-07 NOTE — Progress Notes (Addendum)
Patient ID: Victoria Walker, female   DOB: 11-22-1950, 63 y.o.   MRN: [id number]   HPI  Victoria Walker is a 63 y.o.-year-old female, referred by her PCP, Dr. Jackqulyn Livings, in consultation for MNG.  She had a thyroid nodule taken out in 1976. She started Levothyroxine "years ago". She is on Levothyroxine 25 mcg daily, dose decreased after last TSH came back suppressed.   She was dx with thyroid nodule several years ago, last U/S in 2006.  Thyroid U/S (11/04/2013):  Right thyroid lobe Measurements: Borderline enlarged measuring 6.2 x 1.9 x 3 cm, grossly unchanged Right, superior, lateral (labeled #1) - 1.3 x 0.9 x 1.3 cm - anechoic, likely cystic with internal echogenic nodule suggestive of colloid - not discretely seen on the prior examination.  *Right, superior (labeled #2) - 1.5 x 0.7 x 1.6 cm - mixed echogenic, partially cystic, predominantly solid - minimally increased in size in interval, previously, 1.2 x 0.7 x 1.2 cm  Right, superior (labeled #3) - 0.9 x 0.8 x 0.8 cm - anechoic, cystic with peripheral echogenic nodule with ring down artifact suggestive of colloid - not discretely measured on the prior examination.  *Right, mid (labeled #4) - 2.1 x 2.3 x 1.5 cm - mixed echogenic, solid - increased in size in interval, previously 1.6 x 0.9 x 1.2 cm.  Left thyroid lobe Measurements: Normal in size measuring 5.3 x 2.7 x 2.6 cm, unchanged  Left, superior (labeled #1)- 0.8 x 0.8 x 0.3 cm - anechoic, cystic - not discretely measured on the prior examination.  *Left, mid/inferior (labeled #3) - 3.2 x 2.1 x 2.3 cm - mixed echogenic, solid, increased in size in interval, previously, 1.1 x 0.8 x 0.9 cm.  Isthmus Thickness: Normal in size measuring 0.3 cm in diameter, unchanged. No discrete nodules identified within the  thyroid isthmus.  Lymphadenopathy None visualized.  Pt denies feeling nodules in neck, hoarseness, dysphagia/odynophagia, SOB with lying down.  I reviewed pt's thyroid tests: Lab  Results  Component Value Date   TSH 0.264* 10/11/2013   TSH 0.536 01/04/2013   TSH 0.075* 10/04/2012   FREET4 0.98 10/26/2013    Pt c/o: - + fatigue - + heat intolerance (hot flushes) - less since stopping Arimidex 3 years ago - no tremors - no palpitations - no anxiety/depression - no hyperdefecation/constipation - no weight loss/weight gain - no dry skin - no hair falling  Pt does have a FH of thyroid ds. in mother (hypothyroidism) and sister. + FH of thyroid cancer in sister. No h/o radiation tx to head or neck.  No seaweed or kelp, no recent contrast studies. No steroid use. No herbal supplements.   ROS: Constitutional: see HPI Eyes: no blurry vision, no xerophthalmia ENT: no sore throat, no nodules palpated in throat, no dysphagia/odynophagia, no hoarseness Cardiovascular: no CP/SOB/palpitations/leg swelling Respiratory: no cough/SOB Gastrointestinal: no N/V/D/C, + heartburn Musculoskeletal: no muscle/+ joint aches Skin: no rashes Neurological: no tremors/numbness/tingling/dizziness Psychiatric: no depression/anxiety + low libido  Past Medical History  Diagnosis Date  . Hypertension   . Neuropathy   . High cholesterol   . Hypothyroidism   . Diabetes mellitus without complication     type 2  . GERD (gastroesophageal reflux disease)   . Cancer     breast cancer  . Arthritis     knees, back   Past Surgical History  Procedure Laterality Date  . Thyroid operation  1976    removed tumor  . Abdominal hysterectomy  1988  . Mastectomy  2007    right  . Plantar fascia surgery      right  . Tummy tuck  1986  . Hernia repair  1986  . Cardiac catheterization  1999  . Anterior and posterior repair  05/11/2012    Procedure: ANTERIOR (CYSTOCELE) AND POSTERIOR REPAIR (RECTOCELE);  Surgeon: Reece Packer, MD;  Location: [facility];  Service: Urology;;  with graft  . Vaginal prolapse repair  05/11/2012    Procedure: VAGINAL VAULT SUSPENSION;  Surgeon: Reece Packer,  MD;  Location: [facility];  Service: Urology;;  . Cystoscopy with urethral dilatation  05/11/2012    Procedure: CYSTOSCOPY WITH URETHRAL DILATATION;  Surgeon: Reece Packer, MD;  Location: [facility];  Service: Urology;;   History   Social History  . Marital Status: Married    Spouse Name: N/A    Number of Children: 3   Occupational History  .      Retired   Social History Main Topics  . Smoking status: Never Smoker   . Smokeless tobacco: Never Used  . Alcohol Use: No  . Drug Use: No  . Sexual Activity: Yes    Birth Control/ Protection: Surgical   Current Outpatient Prescriptions on File Prior to Visit  Medication Sig Dispense Refill  . atorvastatin (LIPITOR) 20 MG tablet Take 20 mg by mouth at bedtime.       . calcium-vitamin D (OSCAL WITH D) 250-125 MG-UNIT per tablet Take 1 tablet by mouth daily.      . Carboxymethylcellul-Glycerin (OPTIVE SENSITIVE) 0.5-0.9 % SOLN Apply 1 drop to eye daily.      . Ciclopirox 1 % shampoo       . cycloSPORINE (RESTASIS) 0.05 % ophthalmic emulsion Place 1 drop into both eyes 2 (two) times daily.      Marland Kitchen diltiazem (TIAZAC) 360 MG 24 hr capsule Take 360 mg by mouth every morning.       Marland Kitchen esomeprazole (NEXIUM) 40 MG capsule Take 1 capsule (40 mg total) by mouth daily before breakfast.  30 capsule  1  . fexofenadine ([name]) 180 MG tablet Take 180 mg by mouth every morning.       . fish oil-omega-3 fatty acids 1000 MG capsule Take 1 g by mouth daily.       Marland Kitchen gabapentin (NEURONTIN) 300 MG capsule Take 300 mg by mouth daily.      . irbesartan (AVAPRO) 150 MG tablet Take 0.5 tablets (75 mg total) by mouth at bedtime.  45 tablet  1  . lamoTRIgine (LAMICTAL) 200 MG tablet       . levothyroxine (SYNTHROID, LEVOTHROID) 25 MCG tablet TAKE 1 TABLET (25 MCG TOTAL) BY MOUTH DAILY BEFORE BREAKFAST.  90 tablet  3  . loteprednol (LOTEMAX) 0.5 % ophthalmic suspension Place 1 drop into the left eye daily.       . metFORMIN (GLUCOPHAGE-XR) 750 MG 24 hr tablet Take one  tablet in am and one tablet at lunch  60 tablet  1  . metoprolol succinate (TOPROL-XL) 25 MG 24 hr tablet TAKE 1 TABLET BY MOUTH TWICE A DAY AT 12 NOON AND 4PM  60 tablet  2  . Multiple Vitamins-Minerals (MULTIVITAMIN WITH MINERALS) tablet Take 1 tablet by mouth daily.      Marland Kitchen NASONEX 50 MCG/ACT nasal spray       . spironolactone (ALDACTONE) 25 MG tablet Take 37.5 mg by mouth every morning.       . traMADol-acetaminophen (ULTRACET) 37.5-325 MG per tablet Take 1  tablet by mouth every 6 (six) hours as needed.  20 tablet  0  . vitamin E 1000 UNIT capsule Take 1,000 Units by mouth daily.       No current facility-administered medications on file prior to visit.   Allergies  Allergen Reactions  . Penicillins Itching  . Sulfa Antibiotics Itching  . Codeine Anxiety    Jittery   . Vicodin [Hydrocodone-Acetaminophen]    Family History  Problem Relation Age of Onset  . Hypertension Mother   . Hypertension Father   . Diabetes Sister   . Cancer Sister   . Hypertension Sister   . Diabetes Brother   . Hypertension Brother   . Hypertension Maternal Aunt   . Hypertension Maternal Uncle   . Hypertension Paternal Aunt   . Cancer Paternal Aunt   . Hypertension Paternal Uncle   . Hypertension Maternal Grandmother   . Hypertension Maternal Grandfather   . Hypertension Paternal Grandmother   . Hypertension Paternal Grandfather    PE: BP 116/64  Pulse 75  Temp(Src) 98.3 F (36.8 C) (Oral)  Resp 12  Ht 5\' 5"  (1.651 m)  Wt 179 lb (81.194 kg)  BMI 29.79 kg/m2  SpO2 96% Wt Readings from Last 3 Encounters:  11/07/13 179 lb (81.194 kg)  10/11/13 178 lb (80.74 kg)  09/14/13 179 lb (81.194 kg)   Constitutional: overweight, in NAD Eyes: PERRLA, EOMI, no exophthalmos ENT: moist mucous membranes, + thyromegaly L>R, no individual nodules palpated, no cervical lymphadenopathy Cardiovascular: RRR, No MRG Respiratory: CTA B Gastrointestinal: abdomen soft, NT, ND, BS+ Musculoskeletal: no  deformities, strength intact in all 4;  Skin: moist, warm, no rashes Neurological: no tremor with outstretched hands, DTR normal in all 4  ASSESSMENT: 1. MNG - 3 of her Thyroid nodules enlarged since the 2006 thyroid U/S  2. Hypothyroidism  PLAN: 1. MNG  - I reviewed the images of her thyroid ultrasound along with the patient and her husband. I pointed out that the L dominant nodule is large, this being a risk factor for cancer. Otherwise, the nodules are without calcifications, without internal blood flow, more wide than tall, and well delimited from surrounding tissue. Pt also has a thyroid cancer family history (follicular ThyCa in sister), so the risk of cancer in her is higher than the rest of the population. - the only way that we can tell exactly if it is cancer or not is by doing a thyroid biopsy (FNA). I explained what the test entails. - I explained that this is not cancer, we can continue to follow her on a yearly basis, and check another ultrasound in another year or 2. - she should let me know if she develops neck compression symptoms, in that case, we might need to do either lobectomy or thyroidectomy - patient decided to have the FNA done now >> I ordered this.  - I'll see her back in a year, assuming her FNA is normal. If FNA abnormal, we will meet sooner.  - I advised pt to join my chart and I will send her the results through there   2. Mild hypothyroidism - last TSH was low >> LT4 dose decreased from 50 to 25 mcg >> will continue this dose - will check TSH and free T4 today  Office Visit on 11/07/2013  Component Date Value Ref Range Status  . TSH 11/07/2013 0.35  0.35 - 4.50 uIU/mL Final  . Free T4 11/07/2013 0.75  0.60 - 1.60 ng/dL Final   Continue  LT4 25 mcg for now, but may consider coming off in the near future.  Adequacy Reason Satisfactory For Evaluation. Diagnosis THYROID, FINE NEEDLE ASPIRATION, RIGHT MID (SPECIMEN 1 OF 3, COLLECTED ON  11/16/13): BENIGN. FINDINGS CONSISTENT WITH A BENIGN FOLLICULAR NODULE. Enid Cutter MD Pathologist, Electronic Signature (Case signed 11/17/2013) Specimen Clinical Information Multi nodular goiter, History of breast cancer and diabetes, Right, mid (labeled #4) -2.1 x 2.3 x 1.5cm- mixed echogenic, solid - increased in size in interval, previously 1.6 x 0.9 x 1.2cm  Adequacy Reason Satisfactory For Evaluation. Diagnosis THYROID, FINE NEEDLE ASPIRATION LEFT MID/INFERIOR, (SPECIMEN 2 OF 3): BENIGN. FINDINGS CONSISTENT WITH A BENIGN FOLLICULAR NODULE. Enid Cutter MD Pathologist, Electronic Signature (Case signed 11/17/2013) Specimen Clinical Information Multi nodular goiter, History of breast cancer and diabetes, Left, mid/inferior (labeled #3) - 3.2 x 2.1 x 2.3cm - mixed echogenic, solid, increased in size in interval, previously 1.1 x 0.8 x 0.9cm  Adequacy Reason Satisfactory For Evaluation. Diagnosis THYROID, FINE NEEDLE ASPIRATION RIGHT SUPERIOR (SPECIMEN 3 OF 3 COLLECTED 1/44/8185) FOLLICULAR EPITHELIUM IN A MICROFOLLICULAR PATTERN. SEE COMMENT. COMMENT: THE SPECIMEN IS HYPERCELLULAR AND CONSISTS OF MEDIUM AND LARGE GROUPS OF FOLLICULAR EPITHELIAL CELLS WITH A MICROFOLLICULAR ARCHITECTURE. THERE IS MINIMAL BACKGROUND COLLOID. BASED ON THESE FEATURES, A FOLLICULAR LESION/NEOPLASM CAN NOT BE ENTIRELY RULED OUT. Enid Cutter MD Pathologist, Electronic Signature (Case signed 11/17/2013) Specimen Clinical Information Multi nodular goiter, History of breast cancer and diabetes, Right, superior (labeled #2) - 1.5 x 0.7 x 1.6cm - mixed echogenic, partially cystic, predominantly solid - minimally increased in size in interval, previously, 1.2 x 0.7 x 1.2cm  Called and informed the pt >> she agrees to wait 6 mo and repeat the Bx, but will think about it and let me know if she changes her mind and decides to proceed with lobectomy. (Her sister went through the same process before her  cancer was dx'ed).

## 2013-11-08 LAB — TSH: TSH: 0.35 u[IU]/mL (ref 0.35–4.50)

## 2013-11-08 LAB — T4, FREE: Free T4: 0.75 ng/dL (ref 0.60–1.60)

## 2013-11-08 NOTE — Telephone Encounter (Signed)
Requested Medications     Medication name:  Name from pharmacy:  metoprolol succinate (TOPROL-XL) 25 MG 24 hr tablet  METOPROLOL SUCC ER 25 MG TAB    Sig: TAKE 1 TABLET BY MOUTH TWICE A DAY AT 12 NOON AND 4PM    Dispense: 60 tablet Refills: 2 Start: 11/07/2013  Class: Normal    Requested on: 08/18/2013    Originally ordered on: 05/05/2011 Last refill: 10/10/2013 Order History and Details

## 2013-11-09 ENCOUNTER — Encounter: Payer: Self-pay | Admitting: *Deleted

## 2013-11-14 ENCOUNTER — Encounter: Payer: Self-pay | Admitting: Internal Medicine

## 2013-11-14 ENCOUNTER — Ambulatory Visit (INDEPENDENT_AMBULATORY_CARE_PROVIDER_SITE_OTHER): Payer: Medicare HMO | Admitting: Internal Medicine

## 2013-11-14 VITALS — BP 127/75 | HR 89 | Temp 97.1°F | Resp 17 | Ht 65.0 in | Wt 179.0 lb

## 2013-11-14 DIAGNOSIS — I10 Essential (primary) hypertension: Secondary | ICD-10-CM

## 2013-11-14 DIAGNOSIS — E042 Nontoxic multinodular goiter: Secondary | ICD-10-CM

## 2013-11-14 MED ORDER — TRAMADOL-ACETAMINOPHEN 37.5-325 MG PO TABS: 1.0000 | ORAL_TABLET | Freq: Four times a day (QID) | ORAL | Status: DC | PRN

## 2013-11-14 MED ORDER — IRBESARTAN 75 MG PO TABS: 75.0000 mg | ORAL_TABLET | Freq: Every day | ORAL | Status: DC

## 2013-11-14 NOTE — Patient Instructions (Signed)
See me at CPE  To pharmacy

## 2013-11-14 NOTE — Progress Notes (Signed)
Subjective:    Patient ID: Victoria Walker, female    DOB: 1950-11-15, 63 y.o.   MRN: [id number]  HPI Victoria Walker is here for pre-op eval prior to TKR  See thyroid ultrasound .  She has increasing size in nodule  - now measuring just over 3 cm.  She has thyroid bx scheduled for 7/29.  Sister has thryoid cancer  No compressive symptoms.    She is using 75 mg of Avapro instead of 150 mg.      She wishes to delay her TKR surgery.  Husband is ill and she will be having biopsy   Allergies  Allergen Reactions  . Penicillins Itching  . Sulfa Antibiotics Itching  . Codeine Anxiety    Jittery   . Vicodin [Hydrocodone-Acetaminophen]    Past Medical History  Diagnosis Date  . Hypertension   . Neuropathy   . High cholesterol   . Hypothyroidism   . Diabetes mellitus without complication     type 2  . GERD (gastroesophageal reflux disease)   . Cancer     breast cancer  . Arthritis     knees, back   Past Surgical History  Procedure Laterality Date  . Thyroid operation  1976    removed tumor  . Abdominal hysterectomy  1988  . Mastectomy  2007    right  . Plantar fascia surgery      right  . Tummy tuck  1986  . Hernia repair  1986  . Cardiac catheterization  1999  . Anterior and posterior repair  05/11/2012    Procedure: ANTERIOR (CYSTOCELE) AND POSTERIOR REPAIR (RECTOCELE);  Surgeon: Reece Packer, MD;  Location: [facility];  Service: Urology;;  with graft  . Vaginal prolapse repair  05/11/2012    Procedure: VAGINAL VAULT SUSPENSION;  Surgeon: Reece Packer, MD;  Location: [facility];  Service: Urology;;  . Cystoscopy with urethral dilatation  05/11/2012    Procedure: CYSTOSCOPY WITH URETHRAL DILATATION;  Surgeon: Reece Packer, MD;  Location: [facility];  Service: Urology;;   History   Social History  . Marital Status: Married    Spouse Name: N/A    Number of Children: 3  . Years of Education: N/A   Occupational History  .      Retired   Social History Main Topics    . Smoking status: Never Smoker   . Smokeless tobacco: Never Used  . Alcohol Use: No  . Drug Use: No  . Sexual Activity: Yes    Birth Control/ Protection: Surgical   Other Topics Concern  . Not on file   Social History Narrative  . No narrative on file   Family History  Problem Relation Age of Onset  . Hypertension Mother   . Hypertension Father   . Diabetes Sister   . Cancer Sister   . Hypertension Sister   . Diabetes Brother   . Hypertension Brother   . Hypertension Maternal Aunt   . Hypertension Maternal Uncle   . Hypertension Paternal Aunt   . Cancer Paternal Aunt   . Hypertension Paternal Uncle   . Hypertension Maternal Grandmother   . Hypertension Maternal Grandfather   . Hypertension Paternal Grandmother   . Hypertension Paternal Grandfather    Patient Active Problem List   Diagnosis Date Noted  . 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 Right knee DJD 07/12/2013  . Neuralgia of left foot 06/24/2013  . Tarsal tunnel syndrome 04/08/2013  . Vaginal dryness, menopausal 04/05/2013  .  Pronation deformity of ankle, acquired 02/09/2013  . Diabetic neuropathy with neurologic complication 30/94/0768  . Ingrown nail 02/02/2013  . H/O bone density study 01/26/2013  . Chest pain 01/05/2013  . History of iritis 01/04/2013  . GERD (gastroesophageal reflux disease) 10/05/2012  . Type II or unspecified type diabetes mellitus without mention of complication, uncontrolled 10/04/2012  . Essential hypertension, benign 10/04/2012  . Other and unspecified hyperlipidemia 10/04/2012  . 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 Diabetic neuropathy 10/04/2012  . S/P hysterectomy 10/04/2012  . Osteopenia 10/04/2012  . Hypothyroidism 10/04/2012  . History of cystocele 10/04/2012  . Dyspareunia 10/04/2012  . Plantar fasciitis 10/04/2012  . Breast cancer 05/06/2011   Current Outpatient Prescriptions on File Prior to Visit  Medication Sig Dispense Refill  . atorvastatin (LIPITOR) 20  MG tablet Take 20 mg by mouth at bedtime.       . calcium-vitamin D (OSCAL WITH D) 250-125 MG-UNIT per tablet Take 1 tablet by mouth daily.      . Carboxymethylcellul-Glycerin (OPTIVE SENSITIVE) 0.5-0.9 % SOLN Apply 1 drop to eye daily.      . Ciclopirox 1 % shampoo       . cycloSPORINE (RESTASIS) 0.05 % ophthalmic emulsion Place 1 drop into both eyes 2 (two) times daily.      Marland Kitchen diltiazem (TIAZAC) 360 MG 24 hr capsule Take 360 mg by mouth every morning.       Marland Kitchen esomeprazole (NEXIUM) 40 MG capsule Take 1 capsule (40 mg total) by mouth daily before breakfast.  30 capsule  1  . fexofenadine ([name]) 180 MG tablet Take 180 mg by mouth every morning.       . fish oil-omega-3 fatty acids 1000 MG capsule Take 1 g by mouth daily.       Marland Kitchen gabapentin (NEURONTIN) 300 MG capsule Take 300 mg by mouth daily.      . irbesartan (AVAPRO) 150 MG tablet Take 0.5 tablets (75 mg total) by mouth at bedtime.  45 tablet  1  . lamoTRIgine (LAMICTAL) 200 MG tablet       . levothyroxine (SYNTHROID, LEVOTHROID) 25 MCG tablet TAKE 1 TABLET (25 MCG TOTAL) BY MOUTH DAILY BEFORE BREAKFAST.  90 tablet  3  . loteprednol (LOTEMAX) 0.5 % ophthalmic suspension Place 1 drop into the left eye daily.       . metFORMIN (GLUCOPHAGE-XR) 750 MG 24 hr tablet TAKE 1 TABLET IN AM AND 1 TABLET AT LUNCH  60 tablet  1  . metoprolol succinate (TOPROL-XL) 25 MG 24 hr tablet TAKE 1 TABLET BY MOUTH TWICE A DAY AT 12 NOON AND 4PM  60 tablet  2  . Multiple Vitamins-Minerals (MULTIVITAMIN WITH MINERALS) tablet Take 1 tablet by mouth daily.      Marland Kitchen NASONEX 50 MCG/ACT nasal spray       . spironolactone (ALDACTONE) 25 MG tablet Take 37.5 mg by mouth every morning.       . traMADol-acetaminophen (ULTRACET) 37.5-325 MG per tablet Take 1 tablet by mouth every 6 (six) hours as needed.  20 tablet  0  . vitamin E 1000 UNIT capsule Take 1,000 Units by mouth daily.       No current facility-administered medications on file prior to visit.       Review of  Systems See HPI    Objective:   Physical Exam Physical Exam  Nursing note and vitals reviewed.  Constitutional: She is oriented to person, place, and time. She appears well-developed and well-nourished.  HENT:  Head: Normocephalic and atraumatic.  Cardiovascular: Normal rate and regular rhythm. Exam reveals no gallop and no friction rub.  No murmur heard.  Pulmonary/Chest: Breath sounds normal. She has no wheezes. She has no rales.  Neurological: She is alert and oriented to person, place, and time.  Skin: Skin is warm and dry.  Psychiatric: She has a normal mood and affect. Her behavior is normal.              Assessment & Plan:  HTN  Refill Avapro 75 mg #90  With 1 refill  MNG  bx pending  Advanced DJD knees.  OK for Tramadol prn  She is using  1 daily only   See me at CPE

## 2013-11-16 ENCOUNTER — Ambulatory Visit
Admission: RE | Admit: 2013-11-16 | Discharge: 2013-11-16 | Disposition: A | Discharge status: Home / Self Care | Payer: Medicare HMO | Source: Ambulatory Visit | Attending: Internal Medicine | Admitting: Internal Medicine

## 2013-11-16 ENCOUNTER — Other Ambulatory Visit (HOSPITAL_COMMUNITY)
Admission: RE | Admit: 2013-11-16 | Discharge: 2013-11-16 | Disposition: A | Discharge status: Home / Self Care | Payer: Medicare HMO | Source: Ambulatory Visit | Attending: Interventional Radiology | Admitting: Interventional Radiology

## 2013-11-16 DIAGNOSIS — E042 Nontoxic multinodular goiter: Secondary | ICD-10-CM | POA: Diagnosis present

## 2013-11-16 DIAGNOSIS — Z853 Personal history of malignant neoplasm of breast: Secondary | ICD-10-CM | POA: Diagnosis not present

## 2013-11-28 ENCOUNTER — Telehealth: Payer: Self-pay | Admitting: Internal Medicine

## 2013-11-28 ENCOUNTER — Other Ambulatory Visit: Payer: Self-pay | Admitting: Internal Medicine

## 2013-11-28 DIAGNOSIS — E042 Nontoxic multinodular goiter: Secondary | ICD-10-CM

## 2013-11-28 NOTE — Progress Notes (Signed)
Philemon Kingdom, MD at 11/28/2013 1:23 PM    Status: Signed       I understand. I will refer her to Dr Harlow Asa.        Elige Ko Callicutt, CMA at 4/38/8875 10:59 AM     Status: Signed        Please read note below and advise.         Brunilda Payor at 11/28/2013 10:53 AM     Status: Signed        Patient stated that she want to go to the surgeon for a consultation, for the nodules in her neck, she didn't want to wait six months. Please advise

## 2013-11-28 NOTE — Telephone Encounter (Signed)
Please read note below and advise.  

## 2013-11-28 NOTE — Telephone Encounter (Signed)
I understand. I will refer her to Dr Harlow Asa.

## 2013-11-28 NOTE — Telephone Encounter (Signed)
Called pt and advised her per Dr Gherghe's note. Pt understood.  

## 2013-11-28 NOTE — Telephone Encounter (Signed)
Patient stated that she want to go to the surgeon for a consultation, for the nodules in her neck, she didn't want to wait six months.  Please advise

## 2013-11-29 ENCOUNTER — Other Ambulatory Visit: Payer: Self-pay | Admitting: Internal Medicine

## 2013-11-29 NOTE — Telephone Encounter (Signed)
Requested Medications     Medication name:  Name from pharmacy:  levothyroxine (SYNTHROID, LEVOTHROID) 25 MCG tablet  LEVOTHYROXINE 25 MCG TABLET    Sig: TAKE 1 TABLET (25 MCG TOTAL) BY MOUTH DAILY BEFORE BREAKFAST.    Dispense: 90 tablet Refills: 3 Start: 11/29/2013  Class: Normal    Requested on: 01/13/2013    Originally ordered on: 10/19/2012 Last refill: 09/17/2013 Order History and Details

## 2013-12-02 ENCOUNTER — Other Ambulatory Visit: Payer: Self-pay | Admitting: *Deleted

## 2013-12-02 ENCOUNTER — Other Ambulatory Visit (HOSPITAL_COMMUNITY): Payer: Medicare HMO

## 2013-12-02 NOTE — Telephone Encounter (Signed)
Pt called in requesting refill ,is also asking for 90 day supply please.

## 2013-12-04 MED ORDER — METOPROLOL SUCCINATE ER 25 MG PO TB24: ORAL_TABLET | ORAL | Status: DC

## 2013-12-04 NOTE — Telephone Encounter (Signed)
Victoria Walker     I refilled this pts metoprolol  I need to double check from pharmacy how she is taking the metformin tablets.  Have pharmacy fax me the order for metformin and give to me

## 2013-12-05 ENCOUNTER — Other Ambulatory Visit: Payer: Self-pay | Admitting: Internal Medicine

## 2013-12-05 NOTE — Telephone Encounter (Signed)
Called pharm & they will fax request.

## 2013-12-06 ENCOUNTER — Other Ambulatory Visit: Payer: Self-pay | Admitting: *Deleted

## 2013-12-06 MED ORDER — METFORMIN HCL ER 750 MG PO TB24: ORAL_TABLET | ORAL | Status: DC

## 2013-12-06 NOTE — Telephone Encounter (Signed)
Pt called asking for refill & per instructions by [employer] is requesting 90 day supply.

## 2013-12-07 NOTE — Telephone Encounter (Signed)
Spoke w/ PT & she has gotten medication filled

## 2013-12-08 NOTE — Telephone Encounter (Signed)
Pt already picked up both medications from pharm

## 2013-12-12 ENCOUNTER — Encounter (HOSPITAL_COMMUNITY): Payer: Self-pay

## 2013-12-12 ENCOUNTER — Inpatient Hospital Stay (HOSPITAL_COMMUNITY): Admit: 2013-12-12 | Payer: Medicare HMO | Admitting: Orthopedic Surgery

## 2013-12-12 SURGERY — ARTHROPLASTY, KNEE, TOTAL
Anesthesia: General | Site: Knee | Laterality: Right

## 2013-12-15 ENCOUNTER — Encounter: Payer: Self-pay | Admitting: Internal Medicine

## 2013-12-15 ENCOUNTER — Ambulatory Visit (INDEPENDENT_AMBULATORY_CARE_PROVIDER_SITE_OTHER): Payer: Medicare HMO | Admitting: Internal Medicine

## 2013-12-15 VITALS — BP 116/68 | HR 77 | Resp 16 | Ht 65.0 in | Wt 179.0 lb

## 2013-12-15 DIAGNOSIS — E042 Nontoxic multinodular goiter: Secondary | ICD-10-CM

## 2013-12-15 DIAGNOSIS — N9089 Other specified noninflammatory disorders of vulva and perineum: Secondary | ICD-10-CM

## 2013-12-15 MED ORDER — METRONIDAZOLE 1 % EX GEL: Freq: Every day | CUTANEOUS | Status: DC

## 2013-12-15 NOTE — Progress Notes (Signed)
Subjective:    Patient ID: Victoria Walker, female    DOB: 03-07-1951, 63 y.o.   MRN: [id number]  HPI  Victoria Walker is here for acute visit.     MNG  See BX results - hypercellular result on one specimen  She tells me she will be calling endocrine office with request to discuss options with thyroid surgeon   She reports external vulvar burning.  No vaginal discharge.  She is not very sexually active at this time.  She has tried Premarin cream in past but could not tolerate.  Metrogel seemed to work for her.  She tells me she did see DrMarland Kitchen  Garwin Brothers in the past who metioned to pt. That if symptoms persist she will need bx.  She has not been back to see Dr. Garwin Brothers due to change in insurance  Allergies  Allergen Reactions  . Penicillins Itching  . Sulfa Antibiotics Itching  . Codeine Anxiety    Jittery   . Vicodin [Hydrocodone-Acetaminophen]    Past Medical History  Diagnosis Date  . Hypertension   . Neuropathy   . High cholesterol   . Hypothyroidism   . Diabetes mellitus without complication     type 2  . GERD (gastroesophageal reflux disease)   . Cancer     breast cancer  . Arthritis     knees, back   Past Surgical History  Procedure Laterality Date  . Thyroid operation  1976    removed tumor  . Abdominal hysterectomy  1988  . Mastectomy  2007    right  . Plantar fascia surgery      right  . Tummy tuck  1986  . Hernia repair  1986  . Cardiac catheterization  1999  . Anterior and posterior repair  05/11/2012    Procedure: ANTERIOR (CYSTOCELE) AND POSTERIOR REPAIR (RECTOCELE);  Surgeon: Reece Packer, MD;  Location: [facility];  Service: Urology;;  with graft  . Vaginal prolapse repair  05/11/2012    Procedure: VAGINAL VAULT SUSPENSION;  Surgeon: Reece Packer, MD;  Location: [facility];  Service: Urology;;  . Cystoscopy with urethral dilatation  05/11/2012    Procedure: CYSTOSCOPY WITH URETHRAL DILATATION;  Surgeon: Reece Packer, MD;  Location: [facility];  Service:  Urology;;   History   Social History  . Marital Status: Married    Spouse Name: N/A    Number of Children: 3  . Years of Education: N/A   Occupational History  .      Retired   Social History Main Topics  . Smoking status: Never Smoker   . Smokeless tobacco: Never Used  . Alcohol Use: No  . Drug Use: No  . Sexual Activity: Yes    Birth Control/ Protection: Surgical   Other Topics Concern  . Not on file   Social History Narrative  . No narrative on file   Family History  Problem Relation Age of Onset  . Hypertension Mother   . Hypertension Father   . Diabetes Sister   . Cancer Sister   . Hypertension Sister   . Diabetes Brother   . Hypertension Brother   . Hypertension Maternal Aunt   . Hypertension Maternal Uncle   . Hypertension Paternal Aunt   . Cancer Paternal Aunt   . Hypertension Paternal Uncle   . Hypertension Maternal Grandmother   . Hypertension Maternal Grandfather   . Hypertension Paternal Grandmother   . Hypertension Paternal Grandfather    Patient Active Problem List  Diagnosis Date Noted  . 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 Right knee DJD 07/12/2013  . Neuralgia of left foot 06/24/2013  . Tarsal tunnel syndrome 04/08/2013  . Vaginal dryness, menopausal 04/05/2013  . Pronation deformity of ankle, acquired 02/09/2013  . Diabetic neuropathy with neurologic complication 09/32/3557  . Ingrown nail 02/02/2013  . H/O bone density study 01/26/2013  . Chest pain 01/05/2013  . History of iritis 01/04/2013  . GERD (gastroesophageal reflux disease) 10/05/2012  . Type II or unspecified type diabetes mellitus without mention of complication, uncontrolled 10/04/2012  . Essential hypertension, benign 10/04/2012  . Other and unspecified hyperlipidemia 10/04/2012  . 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 Diabetic neuropathy 10/04/2012  . S/P hysterectomy 10/04/2012  . Osteopenia 10/04/2012  . Hypothyroidism 10/04/2012  . History of cystocele  10/04/2012  . Dyspareunia 10/04/2012  . Plantar fasciitis 10/04/2012  . Breast cancer 05/06/2011   Current Outpatient Prescriptions on File Prior to Visit  Medication Sig Dispense Refill  . atorvastatin (LIPITOR) 20 MG tablet Take 20 mg by mouth at bedtime.       . calcium-vitamin D (OSCAL WITH D) 250-125 MG-UNIT per tablet Take 1 tablet by mouth daily.      . Carboxymethylcellul-Glycerin (OPTIVE SENSITIVE) 0.5-0.9 % SOLN Apply 1 drop to eye daily.      . Ciclopirox 1 % shampoo       . cycloSPORINE (RESTASIS) 0.05 % ophthalmic emulsion Place 1 drop into both eyes 2 (two) times daily.      Marland Kitchen diltiazem (TIAZAC) 360 MG 24 hr capsule Take 360 mg by mouth every morning.       Marland Kitchen esomeprazole (NEXIUM) 40 MG capsule Take 1 capsule (40 mg total) by mouth daily before breakfast.  30 capsule  1  . fexofenadine ([name]) 180 MG tablet Take 180 mg by mouth every morning.       . fish oil-omega-3 fatty acids 1000 MG capsule Take 1 g by mouth daily.       Marland Kitchen gabapentin (NEURONTIN) 300 MG capsule Take 300 mg by mouth daily.      . irbesartan (AVAPRO) 75 MG tablet Take 1 tablet (75 mg total) by mouth daily.  90 tablet  1  . lamoTRIgine (LAMICTAL) 200 MG tablet       . levothyroxine (SYNTHROID, LEVOTHROID) 25 MCG tablet TAKE 1 TABLET (25 MCG TOTAL) BY MOUTH DAILY BEFORE BREAKFAST.  90 tablet  0  . loteprednol (LOTEMAX) 0.5 % ophthalmic suspension Place 1 drop into the left eye daily.       . metFORMIN (GLUCOPHAGE-XR) 750 MG 24 hr tablet TAKE 1 TABLET IN AM AND 1 TABLET AT LUNCH  60 tablet  5  . metoprolol succinate (TOPROL-XL) 25 MG 24 hr tablet TAKE 1 TABLET BY MOUTH TWICE A DAY AT 12 NOON AND 4PM  60 tablet  5  . Multiple Vitamins-Minerals (MULTIVITAMIN WITH MINERALS) tablet Take 1 tablet by mouth daily.      Marland Kitchen NASONEX 50 MCG/ACT nasal spray       . spironolactone (ALDACTONE) 25 MG tablet Take 37.5 mg by mouth every morning.       . traMADol-acetaminophen (ULTRACET) 37.5-325 MG per tablet Take 1 tablet by  mouth every 6 (six) hours as needed.  30 tablet  0  . vitamin E 1000 UNIT capsule Take 1,000 Units by mouth daily.       No current facility-administered medications on file prior to visit.      Review of Systems See  HPI    Objective:   Physical Exam Physical Exam  Nursing note and vitals reviewed.  Constitutional: She is oriented to person, place, and time. She appears well-developed and well-nourished.  HENT:  Head: Normocephalic and atraumatic.  Cardiovascular: Normal rate and regular rhythm. Exam reveals no gallop and no friction rub.  No murmur heard.  Pulmonary/Chest: Breath sounds normal. She has no wheezes. She has no rales.  Pelvic  Vulvar she does have a few white areas bilateral upper vulvar  No ulceration or other lesion Vagina no lesion  Speculum exam  KOH and wet prep obtained Neurological: She is alert and oriented to person, place, and time.  Skin: Skin is warm and dry.  Psychiatric: She has a normal mood and affect. Her behavior is normal.        Assessment & Plan:  Vulvar irritation and burning.  She would like to try Metrogel which has helped in past but will refer back to Gynecology to evaluate for possible lichen sclerosus or other etiology .  She may need vulvar bx  MNG  Pending endocrine and possible surgical eval.

## 2013-12-15 NOTE — Patient Instructions (Signed)
Will set up referral to former GYN

## 2013-12-16 LAB — WET PREP, GENITAL
Clue Cells Wet Prep HPF POC: NONE SEEN
Trich, Wet Prep: NONE SEEN
WBC, Wet Prep HPF POC: NONE SEEN
Yeast Wet Prep HPF POC: NONE SEEN

## 2013-12-22 ENCOUNTER — Telehealth: Payer: Self-pay | Admitting: *Deleted

## 2013-12-22 NOTE — Telephone Encounter (Signed)
Left a message with Elecia to call us back

## 2013-12-22 NOTE — Telephone Encounter (Signed)
Cherylynn called back.  I gave her test results.  She saw Dr Garwin Brothers yesterday and had a biopsy.

## 2013-12-22 NOTE — Telephone Encounter (Signed)
Message copied by Baldomero Lamy on Thu Dec 22, 2013  1:23 PM ------      Message from: Emi Belfast D      Created: Sat Dec 17, 2013  5:44 PM       Heahter             Call pt and let her know that her specimens I took are all negative  No yeast infection .  Make sure she has appt with Dr. Garwin Brothers the GYN MD  and advise her to follow with her            Ok to mail labs to her ------

## 2013-12-27 ENCOUNTER — Other Ambulatory Visit: Payer: Self-pay | Admitting: *Deleted

## 2013-12-28 MED ORDER — METFORMIN HCL ER 750 MG PO TB24: ORAL_TABLET | ORAL | Status: DC

## 2013-12-28 MED ORDER — METOPROLOL SUCCINATE ER 25 MG PO TB24: ORAL_TABLET | ORAL | Status: DC

## 2013-12-29 ENCOUNTER — Ambulatory Visit (INDEPENDENT_AMBULATORY_CARE_PROVIDER_SITE_OTHER): Payer: Medicare HMO | Admitting: Surgery

## 2013-12-29 ENCOUNTER — Other Ambulatory Visit (INDEPENDENT_AMBULATORY_CARE_PROVIDER_SITE_OTHER): Payer: Self-pay | Admitting: Surgery

## 2013-12-29 ENCOUNTER — Telehealth: Payer: Self-pay

## 2013-12-29 ENCOUNTER — Telehealth: Payer: Self-pay | Admitting: *Deleted

## 2013-12-29 NOTE — Telephone Encounter (Signed)
Victoria Walker   These were done on 9/9  Call into pharmacy

## 2013-12-29 NOTE — Telephone Encounter (Signed)
Victoria [phone] Walker called and she needs refills for 90 day supply on her  metoprolol succinate (TOPROL-XL) 25 MG 24 hr tablet / metFORMIN (GLUCOPHAGE-XR) 750 MG 24 hr tablet sent to [facility]

## 2013-12-29 NOTE — Telephone Encounter (Signed)
Refill request for metformin

## 2014-01-20 ENCOUNTER — Other Ambulatory Visit: Payer: Self-pay | Admitting: *Deleted

## 2014-01-20 DIAGNOSIS — Z0000 Encounter for general adult medical examination without abnormal findings: Secondary | ICD-10-CM

## 2014-01-22 NOTE — Progress Notes (Addendum)
Subjective:    Patient ID: Victoria Walker, female    DOB: 1951-01-19, 63 y.o.   MRN: [id number]  HPI Ladesha is here for CPE  HM:  UTD wih mm ,  S/P hysterectomy,   Colonoscopy due next year,  Dexa due next year   Needs flu vaccine   HTN  She tells me she went back to Avapro 150 mg and is tolerating fine  Hperlipidemia  Well controlled on low dose lipitor  DM  UTD with optho exam,  No paresthesias  MNG  Thyroidectomy pending  Advanced DJD of Right knee recnet steroid injection  Ortho she is pending TKR once thyroid surgery completed  Vulvar irritation she did see Dr. 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 Penicillins Itching  . Sulfa Antibiotics Itching  . Codeine Anxiety    Jittery   . Vicodin [Hydrocodone-Acetaminophen]    Past Medical History  Diagnosis Date  . Hypertension   . Neuropathy   . High cholesterol   . Hypothyroidism   . Diabetes mellitus without complication     type 2  . GERD (gastroesophageal reflux disease)   . Cancer     breast cancer  . Arthritis     knees, back   Past Surgical History  Procedure Laterality Date  . Thyroid operation  1976    removed tumor  . Abdominal hysterectomy  1988  . Mastectomy  2007    right  . Plantar fascia surgery      right  . Tummy tuck  1986  . Hernia repair  1986  . Cardiac catheterization  1999  . Anterior and posterior repair  05/11/2012    Procedure: ANTERIOR (CYSTOCELE) AND POSTERIOR REPAIR (RECTOCELE);  Surgeon: Reece Packer, MD;  Location: [facility];  Service: Urology;;  with graft  . Vaginal prolapse repair  05/11/2012    Procedure: VAGINAL VAULT SUSPENSION;  Surgeon: Reece Packer, MD;  Location: [facility];  Service: Urology;;  . Cystoscopy with urethral dilatation  05/11/2012    Procedure: CYSTOSCOPY WITH URETHRAL DILATATION;   Surgeon: Reece Packer, MD;  Location: [facility];  Service: Urology;;   History   Social History  . Marital Status: Married    Spouse Name: N/A    Number of Children: 3  . Years of Education: N/A   Occupational History  .      Retired   Social History Main Topics  . Smoking status: Never Smoker   . Smokeless tobacco: Never Used  . Alcohol Use: No  . Drug Use: No  . Sexual Activity: Yes    Birth Control/ Protection: Surgical   Other Topics Concern  . Not on file   Social History Narrative  . No narrative on file   Family History  Problem Relation Age of Onset  . Hypertension Mother   . Hypertension Father   . Diabetes Sister   . Cancer Sister   . Hypertension Sister   . Diabetes Brother   . Hypertension Brother   . Hypertension Maternal Aunt   . Hypertension Maternal Uncle   . Hypertension Paternal Aunt   . Cancer Paternal Aunt   . Hypertension Paternal Uncle   . Hypertension Maternal Grandmother   . Hypertension Maternal Grandfather   . Hypertension Paternal Grandmother   . Hypertension  Paternal Grandfather    Patient Active Problem List   Diagnosis Date Noted  . 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 Right knee DJD 07/12/2013  . Neuralgia of left foot 06/24/2013  . Tarsal tunnel syndrome 04/08/2013  . Vaginal dryness, menopausal 04/05/2013  . Pronation deformity of ankle, acquired 02/09/2013  . Diabetic neuropathy with neurologic complication 76/19/5093  . Ingrown nail 02/02/2013  . H/O bone density study 01/26/2013  . Chest pain 01/05/2013  . History of iritis 01/04/2013  . GERD (gastroesophageal reflux disease) 10/05/2012  . Type II or unspecified type diabetes mellitus without mention of complication, uncontrolled 10/04/2012  . Essential hypertension, benign 10/04/2012  . Other and unspecified hyperlipidemia 10/04/2012  . 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 Diabetic neuropathy 10/04/2012  . S/P hysterectomy 10/04/2012  . Osteopenia  10/04/2012  . Hypothyroidism 10/04/2012  . History of cystocele 10/04/2012  . Dyspareunia 10/04/2012  . Plantar fasciitis 10/04/2012  . Breast cancer 05/06/2011   Current Outpatient Prescriptions on File Prior to Visit  Medication Sig Dispense Refill  . atorvastatin (LIPITOR) 20 MG tablet Take 20 mg by mouth at bedtime.       . calcium-vitamin D (OSCAL WITH D) 250-125 MG-UNIT per tablet Take 1 tablet by mouth daily.      . Carboxymethylcellul-Glycerin (OPTIVE SENSITIVE) 0.5-0.9 % SOLN Apply 1 drop to eye daily.      . Ciclopirox 1 % shampoo       . cycloSPORINE (RESTASIS) 0.05 % ophthalmic emulsion Place 1 drop into both eyes 2 (two) times daily.      Marland Kitchen diltiazem (TIAZAC) 360 MG 24 hr capsule Take 360 mg by mouth every morning.       Marland Kitchen esomeprazole (NEXIUM) 40 MG capsule Take 1 capsule (40 mg total) by mouth daily before breakfast.  30 capsule  1  . fexofenadine ([name]) 180 MG tablet Take 180 mg by mouth every morning.       . fish oil-omega-3 fatty acids 1000 MG capsule Take 1 g by mouth daily.       Marland Kitchen gabapentin (NEURONTIN) 300 MG capsule Take 300 mg by mouth daily.      . irbesartan (AVAPRO) 75 MG tablet Take 1 tablet (75 mg total) by mouth daily.  90 tablet  1  . lamoTRIgine (LAMICTAL) 200 MG tablet       . levothyroxine (SYNTHROID, LEVOTHROID) 25 MCG tablet TAKE 1 TABLET (25 MCG TOTAL) BY MOUTH DAILY BEFORE BREAKFAST.  90 tablet  0  . loteprednol (LOTEMAX) 0.5 % ophthalmic suspension Place 1 drop into the left eye daily.       . metFORMIN (GLUCOPHAGE-XR) 750 MG 24 hr tablet TAKE 1 TABLET IN AM AND 1 TABLET AT LUNCH  60 tablet  5  . metoprolol succinate (TOPROL-XL) 25 MG 24 hr tablet TAKE 1 TABLET BY MOUTH TWICE A DAY AT 12 NOON AND 4PM  60 tablet  5  . metroNIDAZOLE (METROGEL) 1 % gel Apply topically daily.  45 g  0  . Multiple Vitamins-Minerals (MULTIVITAMIN WITH MINERALS) tablet Take 1 tablet by mouth daily.      Marland Kitchen NASONEX 50 MCG/ACT nasal spray       . spironolactone (ALDACTONE)  25 MG tablet Take 37.5 mg by mouth every morning.       . traMADol-acetaminophen (ULTRACET) 37.5-325 MG per tablet Take 1 tablet by mouth every 6 (six) hours as needed.  30 tablet  0  . vitamin E 1000 UNIT capsule Take 1,000 Units by  mouth daily.       No current facility-administered medications on file prior to visit.     Assessment & Plan:     Review of Systems  Constitutional: Negative for unexpected weight change.  Respiratory: Negative for cough, chest tightness, shortness of breath and wheezing.   Cardiovascular: Negative for chest pain, palpitations and leg swelling.  Gastrointestinal: Negative for abdominal pain.       Objective:   Physical Exam  Physical Exam  Nursing note and vitals reviewed.  Constitutional: She is oriented to person, place, and time. She appears well-developed and well-nourished.  HENT:  Head: Normocephalic and atraumatic.  Right Ear: Tympanic membrane and ear canal normal. No drainage. Tympanic membrane is not injected and not erythematous.  Left Ear: Tympanic membrane and ear canal normal. No drainage. Tympanic membrane is not injected and not erythematous.  Nose: Nose normal. Right sinus exhibits no maxillary sinus tenderness and no frontal sinus tenderness. Left sinus exhibits no maxillary sinus tenderness and no frontal sinus tenderness.  Mouth/Throat: Oropharynx is clear and moist. No oral lesions. No oropharyngeal exudate.  Eyes: Conjunctivae and EOM are normal. Pupils are equal, round, and reactive to light.  Neck: Normal range of motion. Neck supple. No JVD present. Carotid bruit is not present. No mass and no thyromegaly present.  Cardiovascular: Normal rate, regular rhythm, S1 normal, S2 normal and intact distal pulses. Exam reveals no gallop and no friction rub.  No murmur heard.  Pulses:  Carotid pulses are 2+ on the right side, and 2+ on the left side.  Dorsalis pedis pulses are 2+ on the right side, and 2+ on the left side.  No  carotid bruit. No LE edema  Pulmonary/Chest: Breath sounds normal. She has no wheezes. She has no rales. She exhibits no tenderness.   Left  Breast  Easily moeveable mass superior to breast on chest wall that pt states is a lipoma.  No breast masses no nipple discharge no axilarry adenopathy  Bilateraly.  R mastectomy scar  No masses  Palpated  Abdominal: Soft. Bowel sounds are normal. She exhibits no distension and no mass. There is no hepatosplenomegaly. There is no tenderness. There is no CVA tenderness.   Rectal guaiac neg  Musculoskeletal: Normal range of motion.  No active synovitis to joints.  Lymphadenopathy:  She has no cervical adenopathy.  She has no axillary adenopathy.  Right: No inguinal and no supraclavicular adenopathy present.  Left: No inguinal and no supraclavicular adenopathy present.  Neurological: She is alert and oriented to person, place, and time. She has normal strength and normal reflexes. She displays no tremor. No cranial nerve deficit or sensory deficit. Coordination and gait normal.  Skin: Skin is warm and dry. No rash noted. No cyanosis. Nails show no clubbing.  Psychiatric: She has a normal mood and affect. Her speech is normal and behavior is normal. Cognition and memory are normal.          Assessment & Plan:  HM:   Flu vaccine today  Pap per Dr. Garwin Brothers,  Mm UTD  colonosocopy 2016.  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

## 2014-01-23 ENCOUNTER — Encounter: Payer: Self-pay | Admitting: *Deleted

## 2014-01-23 ENCOUNTER — Ambulatory Visit (INDEPENDENT_AMBULATORY_CARE_PROVIDER_SITE_OTHER): Payer: Medicare HMO | Admitting: Internal Medicine

## 2014-01-23 ENCOUNTER — Encounter: Payer: Self-pay | Admitting: Internal Medicine

## 2014-01-23 VITALS — BP 142/82 | HR 72 | Temp 97.5°F | Resp 16 | Ht 64.75 in | Wt 177.0 lb

## 2014-01-23 DIAGNOSIS — E042 Nontoxic multinodular goiter: Secondary | ICD-10-CM

## 2014-01-23 DIAGNOSIS — E08618 Diabetes mellitus due to underlying condition with other diabetic arthropathy: Secondary | ICD-10-CM

## 2014-01-23 DIAGNOSIS — Z23 Encounter for immunization: Secondary | ICD-10-CM

## 2014-01-23 DIAGNOSIS — Z0189 Encounter for other specified special examinations: Secondary | ICD-10-CM

## 2014-01-23 DIAGNOSIS — E119 Type 2 diabetes mellitus without complications: Secondary | ICD-10-CM | POA: Insufficient documentation

## 2014-01-23 DIAGNOSIS — Z0000 Encounter for general adult medical examination without abnormal findings: Secondary | ICD-10-CM

## 2014-01-23 DIAGNOSIS — Z1211 Encounter for screening for malignant neoplasm of colon: Secondary | ICD-10-CM

## 2014-01-23 DIAGNOSIS — E785 Hyperlipidemia, unspecified: Secondary | ICD-10-CM | POA: Insufficient documentation

## 2014-01-23 DIAGNOSIS — N9089 Other specified noninflammatory disorders of vulva and perineum: Secondary | ICD-10-CM

## 2014-01-23 DIAGNOSIS — I10 Essential (primary) hypertension: Secondary | ICD-10-CM

## 2014-01-23 LAB — CBC WITH DIFFERENTIAL/PLATELET
Basophils Absolute: 0.1 10*3/uL (ref 0.0–0.1)
Basophils Relative: 1 % (ref 0–1)
Eosinophils Absolute: 0.2 10*3/uL (ref 0.0–0.7)
Eosinophils Relative: 3 % (ref 0–5)
HCT: 39.1 % (ref 36.0–46.0)
Hemoglobin: 13.4 g/dL (ref 12.0–15.0)
Lymphocytes Relative: 50 % — ABNORMAL HIGH (ref 12–46)
Lymphs Abs: 3.6 10*3/uL (ref 0.7–4.0)
MCH: 30.8 pg (ref 26.0–34.0)
MCHC: 34.3 g/dL (ref 30.0–36.0)
MCV: 89.9 fL (ref 78.0–100.0)
Monocytes Absolute: 0.6 10*3/uL (ref 0.1–1.0)
Monocytes Relative: 9 % (ref 3–12)
Neutro Abs: 2.6 10*3/uL (ref 1.7–7.7)
Neutrophils Relative %: 37 % — ABNORMAL LOW (ref 43–77)
Platelets: 297 10*3/uL (ref 150–400)
RBC: 4.35 MIL/uL (ref 3.87–5.11)
RDW: 13.2 % (ref 11.5–15.5)
WBC: 7.1 10*3/uL (ref 4.0–10.5)

## 2014-01-23 LAB — POCT URINALYSIS DIPSTICK
Bilirubin, UA: NEGATIVE
Blood, UA: NEGATIVE
Glucose, UA: NEGATIVE
Ketones, UA: NEGATIVE
Leukocytes, UA: NEGATIVE
Nitrite, UA: NEGATIVE
Protein, UA: NEGATIVE
Spec Grav, UA: 1.015
Urobilinogen, UA: NEGATIVE
pH, UA: 6.5

## 2014-01-23 LAB — HEMOCCULT GUIAC POC 1CARD (OFFICE): Fecal Occult Blood, POC: NEGATIVE

## 2014-01-23 MED ORDER — ETODOLAC 200 MG PO CAPS: 200.0000 mg | ORAL_CAPSULE | Freq: Two times a day (BID) | ORAL | Status: DC

## 2014-01-23 MED ORDER — ATORVASTATIN CALCIUM 20 MG PO TABS: 20.0000 mg | ORAL_TABLET | Freq: Every day | ORAL | Status: DC

## 2014-01-23 MED ORDER — METOPROLOL SUCCINATE ER 25 MG PO TB24: ORAL_TABLET | ORAL | Status: DC

## 2014-01-23 MED ORDER — IRBESARTAN 150 MG PO TABS: 150.0000 mg | ORAL_TABLET | Freq: Every day | ORAL | Status: DC

## 2014-01-23 MED ORDER — METFORMIN HCL ER 750 MG PO TB24: ORAL_TABLET | ORAL | Status: DC

## 2014-01-23 MED ORDER — ESOMEPRAZOLE MAGNESIUM 40 MG PO CPDR: 40.0000 mg | DELAYED_RELEASE_CAPSULE | Freq: Every day | ORAL | Status: DC

## 2014-01-23 NOTE — Patient Instructions (Signed)
To lab today  See me in 3-4 months

## 2014-01-24 ENCOUNTER — Encounter: Payer: Self-pay | Admitting: Internal Medicine

## 2014-01-24 LAB — MICROALBUMIN / CREATININE URINE RATIO
Creatinine, Urine: 136.7 mg/dL
Microalb Creat Ratio: 2.9 mg/g (ref 0.0–30.0)
Microalb, Ur: 0.4 mg/dL (ref <2.0)

## 2014-01-24 LAB — VITAMIN D 25 HYDROXY (VIT D DEFICIENCY, FRACTURES): Vit D, 25-Hydroxy: 47 ng/mL (ref 30–89)

## 2014-01-24 NOTE — Progress Notes (Signed)
Mailed lab results to patient-eh

## 2014-02-10 ENCOUNTER — Encounter (HOSPITAL_COMMUNITY): Payer: Self-pay | Admitting: Pharmacy Technician

## 2014-02-14 ENCOUNTER — Encounter (HOSPITAL_COMMUNITY): Payer: Self-pay

## 2014-02-14 ENCOUNTER — Encounter (HOSPITAL_COMMUNITY)
Admission: RE | Admit: 2014-02-14 | Discharge: 2014-02-14 | Disposition: A | Discharge status: Home / Self Care | Payer: Medicare HMO | Source: Ambulatory Visit | Attending: Surgery | Admitting: Surgery

## 2014-02-14 ENCOUNTER — Ambulatory Visit (HOSPITAL_COMMUNITY)
Admission: RE | Admit: 2014-02-14 | Discharge: 2014-02-14 | Disposition: A | Discharge status: Home / Self Care | Payer: Medicare HMO | Source: Ambulatory Visit | Attending: Anesthesiology | Admitting: Anesthesiology

## 2014-02-14 DIAGNOSIS — I10 Essential (primary) hypertension: Secondary | ICD-10-CM | POA: Diagnosis present

## 2014-02-14 HISTORY — DX: Claustrophobia: F40.240

## 2014-02-14 HISTORY — DX: Personal history of other diseases of the digestive system: Z87.19

## 2014-02-14 HISTORY — DX: Cardiac arrhythmia, unspecified: I49.9

## 2014-02-14 HISTORY — DX: Nontoxic multinodular goiter: E04.2

## 2014-02-14 HISTORY — DX: Personal history of other diseases of the nervous system and sense organs: Z86.69

## 2014-02-14 LAB — BASIC METABOLIC PANEL
Anion gap: 10 (ref 5–15)
BUN: 10 mg/dL (ref 6–23)
CO2: 28 mEq/L (ref 19–32)
Calcium: 10 mg/dL (ref 8.4–10.5)
Chloride: 102 mEq/L (ref 96–112)
Creatinine, Ser: 0.73 mg/dL (ref 0.50–1.10)
GFR calc Af Amer: >90 mL/min (ref >90)
GFR calc non Af Amer: 88 mL/min — ABNORMAL LOW (ref >90)
Glucose, Bld: 90 mg/dL (ref 70–99)
Potassium: 4.4 mEq/L (ref 3.7–5.3)
Sodium: 140 mEq/L (ref 137–147)

## 2014-02-14 LAB — CBC
HCT: 39.8 % (ref 36.0–46.0)
Hemoglobin: 13.5 g/dL (ref 12.0–15.0)
MCH: 31.3 pg (ref 26.0–34.0)
MCHC: 33.9 g/dL (ref 30.0–36.0)
MCV: 92.1 fL (ref 78.0–100.0)
Platelets: 280 10*3/uL (ref 150–400)
RBC: 4.32 MIL/uL (ref 3.87–5.11)
RDW: 12.7 % (ref 11.5–15.5)
WBC: 6.5 10*3/uL (ref 4.0–10.5)

## 2014-02-14 NOTE — Pre-Procedure Instructions (Signed)
EKG AND CXR WERE DONE TODAY - PREOP AT [facility]. 

## 2014-02-14 NOTE — Patient Instructions (Addendum)
YOUR SURGERY IS SCHEDULED AT [facility]  ON:  Thursday  11/5  REPORT TO  SHORT STAY CENTER AT:  5:45 AM   DO NOT EAT OR DRINK ANYTHING AFTER MIDNIGHT THE NIGHT BEFORE YOUR SURGERY.  YOU MAY BRUSH YOUR TEETH, RINSE OUT YOUR MOUTH--BUT NO WATER, NO FOOD, NO CHEWING GUM, NO MINTS, NO CANDIES, NO CHEWING TOBACCO.   PLEASE TAKE THE FOLLOWING MEDICATIONS THE AM OF YOUR SURGERY WITH A FEW SIPS OF WATER:  DILTIAZEM, NEXIUM, LEVOTHYROXINE, METOPROLOL.            YOU MAY USE YOUR EYE DROPS - RESTASIS AND REFRESH, AND MAY USE YOUR NASONEX NASAL SPRAY.    IF YOU ARE DIABETIC:  DO NOT TAKE ANY DIABETIC MEDICATIONS THE AM OF YOUR SURGERY.     DO NOT BRING VALUABLES, MONEY, CREDIT CARDS.  DO NOT WEAR JEWELRY, MAKE-UP, NAIL POLISH AND NO METAL PINS OR CLIPS IN YOUR HAIR. CONTACT LENS, DENTURES / PARTIALS, GLASSES SHOULD NOT BE WORN TO SURGERY AND IN MOST CASES-HEARING AIDS WILL NEED TO BE REMOVED.  BRING YOUR GLASSES CASE, ANY EQUIPMENT NEEDED FOR YOUR CONTACT LENS. FOR PATIENTS ADMITTED TO THE HOSPITAL--CHECK OUT TIME THE DAY OF DISCHARGE IS 11:00 AM.  ALL INPATIENT ROOMS ARE PRIVATE - WITH BATHROOM, TELEPHONE, TELEVISION AND WIFI INTERNET.    PLEASE BE AWARE THAT YOU MAY NEED ADDITIONAL BLOOD DRAWN DAY OF YOUR SURGERY  _______________________________________________________________________   [facility] - Preparing for Surgery Before surgery, you can play an important role.  Because skin is not sterile, your skin needs to be as free of germs as possible.  You can reduce the number of germs on your skin by washing with CHG (chlorahexidine gluconate) soap before surgery.  CHG is an antiseptic cleaner which kills germs and bonds with the skin to continue killing germs even after washing. Please DO NOT use if you have an allergy to CHG or antibacterial soaps.  If your skin becomes reddened/irritated stop using the CHG and inform your nurse when you arrive at Short Stay. Do not shave  (including legs and underarms) for at least 48 hours prior to the first CHG shower.  You may shave your face/neck. Please follow these instructions carefully:  1.  Shower with CHG Soap the night before surgery and the  morning of Surgery.  2.  If you choose to wash your hair, wash your hair first as usual with your  normal  shampoo.  3.  After you shampoo, rinse your hair and body thoroughly to remove the  shampoo.                           4.  Use CHG as you would any other liquid soap.  You can apply chg directly  to the skin and wash                       Gently with a scrungie or clean washcloth.  5.  Apply the CHG Soap to your body ONLY FROM THE NECK DOWN.   Do not use on face/ open                           Wound or open sores. Avoid contact with eyes, ears mouth and genitals (private parts).  Wash face,  Genitals (private parts) with your normal soap.             6.  Wash thoroughly, paying special attention to the area where your surgery  will be performed.  7.  Thoroughly rinse your body with warm water from the neck down.  8.  DO NOT shower/wash with your normal soap after using and rinsing off  the CHG Soap.                9.  Pat yourself dry with a clean towel.            10.  Wear clean pajamas.            11.  Place clean sheets on your bed the night of your first shower and do not  sleep with pets. Day of Surgery : Do not apply any lotions/deodorants the morning of surgery.  Please wear clean clothes to the hospital/surgery center.  FAILURE TO FOLLOW THESE INSTRUCTIONS MAY RESULT IN THE CANCELLATION OF YOUR SURGERY PATIENT SIGNATURE_________________________________  NURSE SIGNATURE__________________________________  ________________________________________________________________________

## 2014-02-16 NOTE — Progress Notes (Signed)
Quick Note:  EKG is acceptable for scheduled surgery.  Tiann Saha M. Jarrette Dehner, MD, FACS [facility] Office: [phone]   ______ 

## 2014-02-16 NOTE — Progress Notes (Signed)
Quick Note:  These results are acceptable for scheduled surgery.  Nana Hoselton M. Earnestine Tuohey, MD, FACS [facility] Office: [phone]   ______ 

## 2014-02-16 NOTE — Progress Notes (Signed)
Quick Note:  Pre-operative chest x-ray is acceptable for scheduled surgery.  Victoria Walker M. Trina Asch, MD, FACS [facility] Office: [phone]   ______ 

## 2014-02-20 ENCOUNTER — Encounter (HOSPITAL_COMMUNITY): Payer: Self-pay

## 2014-02-23 ENCOUNTER — Observation Stay (HOSPITAL_COMMUNITY)
Admission: RE | Admit: 2014-02-23 | Discharge: 2014-02-24 | Disposition: A | Discharge status: Home / Self Care | Payer: Medicare HMO | Source: Ambulatory Visit | Attending: Surgery | Admitting: Surgery

## 2014-02-23 ENCOUNTER — Ambulatory Visit (HOSPITAL_COMMUNITY): Payer: Medicare HMO | Admitting: Anesthesiology

## 2014-02-23 ENCOUNTER — Encounter (HOSPITAL_COMMUNITY)
Admission: RE | Disposition: A | Discharge status: Home / Self Care | Payer: Self-pay | Source: Ambulatory Visit | Attending: Surgery

## 2014-02-23 ENCOUNTER — Encounter (HOSPITAL_COMMUNITY): Payer: Self-pay | Admitting: *Deleted

## 2014-02-23 DIAGNOSIS — M1990 Unspecified osteoarthritis, unspecified site: Secondary | ICD-10-CM | POA: Insufficient documentation

## 2014-02-23 DIAGNOSIS — Z880 Allergy status to penicillin: Secondary | ICD-10-CM | POA: Insufficient documentation

## 2014-02-23 DIAGNOSIS — E042 Nontoxic multinodular goiter: Secondary | ICD-10-CM | POA: Diagnosis not present

## 2014-02-23 DIAGNOSIS — Z79899 Other long term (current) drug therapy: Secondary | ICD-10-CM | POA: Insufficient documentation

## 2014-02-23 DIAGNOSIS — Z853 Personal history of malignant neoplasm of breast: Secondary | ICD-10-CM | POA: Insufficient documentation

## 2014-02-23 DIAGNOSIS — F419 Anxiety disorder, unspecified: Secondary | ICD-10-CM | POA: Diagnosis not present

## 2014-02-23 DIAGNOSIS — K219 Gastro-esophageal reflux disease without esophagitis: Secondary | ICD-10-CM | POA: Diagnosis not present

## 2014-02-23 DIAGNOSIS — Z882 Allergy status to sulfonamides status: Secondary | ICD-10-CM | POA: Insufficient documentation

## 2014-02-23 DIAGNOSIS — E780 Pure hypercholesterolemia: Secondary | ICD-10-CM | POA: Diagnosis not present

## 2014-02-23 DIAGNOSIS — I10 Essential (primary) hypertension: Secondary | ICD-10-CM | POA: Insufficient documentation

## 2014-02-23 DIAGNOSIS — E1140 Type 2 diabetes mellitus with diabetic neuropathy, unspecified: Secondary | ICD-10-CM | POA: Diagnosis not present

## 2014-02-23 DIAGNOSIS — Z885 Allergy status to narcotic agent status: Secondary | ICD-10-CM | POA: Insufficient documentation

## 2014-02-23 HISTORY — PX: THYROIDECTOMY: SHX17

## 2014-02-23 LAB — GLUCOSE, CAPILLARY
Glucose-Capillary: 113 mg/dL — ABNORMAL HIGH (ref 70–99)
Glucose-Capillary: 129 mg/dL — ABNORMAL HIGH (ref 70–99)
Glucose-Capillary: 151 mg/dL — ABNORMAL HIGH (ref 70–99)
Glucose-Capillary: 205 mg/dL — ABNORMAL HIGH (ref 70–99)

## 2014-02-23 SURGERY — THYROIDECTOMY
Anesthesia: General | Site: Neck

## 2014-02-23 MED ORDER — PROPOFOL 10 MG/ML IV BOLUS
INTRAVENOUS | Status: DC | PRN
  Administered 2014-02-23: 160 mg via INTRAVENOUS

## 2014-02-23 MED ORDER — LEVOTHYROXINE SODIUM 88 MCG PO TABS
88.0000 ug | ORAL_TABLET | Freq: Every day | ORAL | Status: DC
  Administered 2014-02-24: 88 ug via ORAL
  Filled 2014-02-23 (×2): qty 1

## 2014-02-23 MED ORDER — PHENYLEPHRINE HCL 10 MG/ML IJ SOLN
INTRAMUSCULAR | Status: AC
  Filled 2014-02-23: qty 1

## 2014-02-23 MED ORDER — KETOROLAC TROMETHAMINE 15 MG/ML IJ SOLN
INTRAMUSCULAR | Status: AC
  Filled 2014-02-23: qty 1

## 2014-02-23 MED ORDER — CIPROFLOXACIN IN D5W 400 MG/200ML IV SOLN
INTRAVENOUS | Status: AC
  Filled 2014-02-23: qty 200

## 2014-02-23 MED ORDER — LIDOCAINE HCL (CARDIAC) 20 MG/ML IV SOLN
INTRAVENOUS | Status: DC | PRN
  Administered 2014-02-23: 50 mg via INTRAVENOUS

## 2014-02-23 MED ORDER — CIPROFLOXACIN IN D5W 400 MG/200ML IV SOLN
400.0000 mg | INTRAVENOUS | Status: AC
  Administered 2014-02-23: 400 mg via INTRAVENOUS

## 2014-02-23 MED ORDER — MIDAZOLAM HCL 2 MG/2ML IJ SOLN
INTRAMUSCULAR | Status: AC
  Filled 2014-02-23: qty 2

## 2014-02-23 MED ORDER — LACTATED RINGERS IV SOLN
INTRAVENOUS | Status: DC | PRN
  Administered 2014-02-23 (×2): via INTRAVENOUS

## 2014-02-23 MED ORDER — KCL IN DEXTROSE-NACL 30-5-0.45 MEQ/L-%-% IV SOLN
INTRAVENOUS | Status: DC
  Administered 2014-02-23: 14:00:00 via INTRAVENOUS
  Filled 2014-02-23 (×5): qty 1000

## 2014-02-23 MED ORDER — METOCLOPRAMIDE HCL 5 MG/ML IJ SOLN
INTRAMUSCULAR | Status: DC | PRN
  Administered 2014-02-23: 10 mg via INTRAVENOUS

## 2014-02-23 MED ORDER — ACETAMINOPHEN 325 MG PO TABS: 650.0000 mg | ORAL_TABLET | ORAL | Status: DC | PRN

## 2014-02-23 MED ORDER — SODIUM CHLORIDE 0.9 % IV SOLN
10.0000 mg | INTRAVENOUS | Status: DC | PRN
  Administered 2014-02-23: 10 ug/min via INTRAVENOUS

## 2014-02-23 MED ORDER — METOPROLOL SUCCINATE ER 25 MG PO TB24
25.0000 mg | ORAL_TABLET | Freq: Two times a day (BID) | ORAL | Status: DC
  Administered 2014-02-23: 25 mg via ORAL
  Filled 2014-02-23 (×3): qty 1

## 2014-02-23 MED ORDER — GLYCOPYRROLATE 0.2 MG/ML IJ SOLN
INTRAMUSCULAR | Status: DC | PRN
  Administered 2014-02-23: 0.6 mg via INTRAVENOUS
  Administered 2014-02-23: 0.1 mg via INTRAVENOUS

## 2014-02-23 MED ORDER — DILTIAZEM HCL ER BEADS 240 MG PO CP24
360.0000 mg | ORAL_CAPSULE | Freq: Every morning | ORAL | Status: DC
  Administered 2014-02-24: 360 mg via ORAL
  Filled 2014-02-23: qty 1

## 2014-02-23 MED ORDER — IRBESARTAN 75 MG PO TABS
75.0000 mg | ORAL_TABLET | Freq: Every day | ORAL | Status: DC
  Filled 2014-02-23 (×2): qty 1

## 2014-02-23 MED ORDER — PROPOFOL 10 MG/ML IV BOLUS
INTRAVENOUS | Status: AC
  Filled 2014-02-23: qty 20

## 2014-02-23 MED ORDER — ROCURONIUM BROMIDE 100 MG/10ML IV SOLN
INTRAVENOUS | Status: AC
  Filled 2014-02-23: qty 1

## 2014-02-23 MED ORDER — CALCIUM CARBONATE 1250 (500 CA) MG PO TABS
2.0000 | ORAL_TABLET | Freq: Three times a day (TID) | ORAL | Status: DC
  Administered 2014-02-23 – 2014-02-24 (×3): 1000 mg via ORAL
  Filled 2014-02-23 (×5): qty 2

## 2014-02-23 MED ORDER — MIDAZOLAM HCL 5 MG/5ML IJ SOLN
INTRAMUSCULAR | Status: DC | PRN
  Administered 2014-02-23 (×2): 1 mg via INTRAVENOUS

## 2014-02-23 MED ORDER — LIDOCAINE HCL (CARDIAC) 20 MG/ML IV SOLN
INTRAVENOUS | Status: AC
  Filled 2014-02-23: qty 5

## 2014-02-23 MED ORDER — LIP MEDEX EX OINT
TOPICAL_OINTMENT | CUTANEOUS | Status: AC
Start: 2014-02-23 — End: 2014-02-24
  Filled 2014-02-23: qty 7

## 2014-02-23 MED ORDER — POLYVINYL ALCOHOL 1.4 % OP SOLN
1.0000 [drp] | Freq: Two times a day (BID) | OPHTHALMIC | Status: DC
  Administered 2014-02-23 – 2014-02-24 (×2): 1 [drp] via OPHTHALMIC
  Filled 2014-02-23: qty 15

## 2014-02-23 MED ORDER — ONDANSETRON HCL 4 MG PO TABS
4.0000 mg | ORAL_TABLET | Freq: Four times a day (QID) | ORAL | Status: DC | PRN
  Filled 2014-02-23: qty 1

## 2014-02-23 MED ORDER — METFORMIN HCL ER 750 MG PO TB24
750.0000 mg | ORAL_TABLET | Freq: Two times a day (BID) | ORAL | Status: DC
  Administered 2014-02-23 – 2014-02-24 (×2): 750 mg via ORAL
  Filled 2014-02-23 (×4): qty 1

## 2014-02-23 MED ORDER — ROCURONIUM BROMIDE 100 MG/10ML IV SOLN
INTRAVENOUS | Status: DC | PRN
  Administered 2014-02-23: 40 mg via INTRAVENOUS
  Administered 2014-02-23: 10 mg via INTRAVENOUS

## 2014-02-23 MED ORDER — ONDANSETRON HCL 4 MG/2ML IJ SOLN
INTRAMUSCULAR | Status: DC | PRN
  Administered 2014-02-23: 4 mg via INTRAVENOUS

## 2014-02-23 MED ORDER — ONDANSETRON HCL 4 MG/2ML IJ SOLN
INTRAMUSCULAR | Status: AC
  Filled 2014-02-23: qty 2

## 2014-02-23 MED ORDER — LOTEPREDNOL ETABONATE 0.5 % OP SUSP
1.0000 [drp] | Freq: Every day | OPHTHALMIC | Status: DC | PRN
  Filled 2014-02-23: qty 5

## 2014-02-23 MED ORDER — DEXAMETHASONE SODIUM PHOSPHATE 10 MG/ML IJ SOLN
INTRAMUSCULAR | Status: DC | PRN
  Administered 2014-02-23: 10 mg via INTRAVENOUS

## 2014-02-23 MED ORDER — CYCLOSPORINE 0.05 % OP EMUL
1.0000 [drp] | Freq: Two times a day (BID) | OPHTHALMIC | Status: DC
  Administered 2014-02-24: 1 [drp] via OPHTHALMIC
  Filled 2014-02-23 (×3): qty 1

## 2014-02-23 MED ORDER — PANTOPRAZOLE SODIUM 40 MG PO TBEC
40.0000 mg | DELAYED_RELEASE_TABLET | Freq: Every day | ORAL | Status: DC
  Administered 2014-02-24: 40 mg via ORAL
  Filled 2014-02-23: qty 1

## 2014-02-23 MED ORDER — ONDANSETRON HCL 4 MG/2ML IJ SOLN: 4.0000 mg | Freq: Four times a day (QID) | INTRAMUSCULAR | Status: DC | PRN

## 2014-02-23 MED ORDER — TRAMADOL HCL 50 MG PO TABS
50.0000 mg | ORAL_TABLET | Freq: Four times a day (QID) | ORAL | Status: DC | PRN
  Administered 2014-02-23 (×2): 50 mg via ORAL
  Filled 2014-02-23 (×2): qty 1

## 2014-02-23 MED ORDER — PHENYLEPHRINE HCL 10 MG/ML IJ SOLN
INTRAMUSCULAR | Status: DC | PRN
Start: 2014-02-23 — End: 2014-02-23
  Administered 2014-02-23 (×3): 80 ug via INTRAVENOUS

## 2014-02-23 MED ORDER — ONDANSETRON HCL 4 MG/2ML IJ SOLN: 4.0000 mg | Freq: Once | INTRAMUSCULAR | Status: DC | PRN

## 2014-02-23 MED ORDER — FENTANYL CITRATE 0.05 MG/ML IJ SOLN
INTRAMUSCULAR | Status: AC
  Filled 2014-02-23: qty 5

## 2014-02-23 MED ORDER — FENTANYL CITRATE 0.05 MG/ML IJ SOLN
INTRAMUSCULAR | Status: AC
  Filled 2014-02-23: qty 2

## 2014-02-23 MED ORDER — SPIRONOLACTONE 25 MG PO TABS
37.5000 mg | ORAL_TABLET | Freq: Every morning | ORAL | Status: DC
  Administered 2014-02-24: 37.5 mg via ORAL
  Filled 2014-02-23 (×2): qty 1

## 2014-02-23 MED ORDER — PHENYLEPHRINE 40 MCG/ML (10ML) SYRINGE FOR IV PUSH (FOR BLOOD PRESSURE SUPPORT)
PREFILLED_SYRINGE | INTRAVENOUS | Status: AC
  Filled 2014-02-23: qty 10

## 2014-02-23 MED ORDER — FENTANYL CITRATE 0.05 MG/ML IJ SOLN
25.0000 ug | INTRAMUSCULAR | Status: DC | PRN
  Administered 2014-02-23 (×3): 25 ug via INTRAVENOUS

## 2014-02-23 MED ORDER — KETOROLAC TROMETHAMINE 15 MG/ML IJ SOLN
15.0000 mg | Freq: Four times a day (QID) | INTRAMUSCULAR | Status: DC | PRN
  Administered 2014-02-23 (×2): 15 mg via INTRAVENOUS
  Filled 2014-02-23 (×3): qty 1

## 2014-02-23 MED ORDER — FENTANYL CITRATE 0.05 MG/ML IJ SOLN
INTRAMUSCULAR | Status: DC | PRN
  Administered 2014-02-23: 100 ug via INTRAVENOUS
  Administered 2014-02-23 (×3): 50 ug via INTRAVENOUS

## 2014-02-23 MED ORDER — GABAPENTIN 300 MG PO CAPS
300.0000 mg | ORAL_CAPSULE | Freq: Every day | ORAL | Status: DC
  Administered 2014-02-23: 300 mg via ORAL
  Filled 2014-02-23 (×2): qty 1

## 2014-02-23 MED ORDER — HYDROMORPHONE HCL 1 MG/ML IJ SOLN
INTRAMUSCULAR | Status: AC
  Filled 2014-02-23: qty 1

## 2014-02-23 MED ORDER — NEOSTIGMINE METHYLSULFATE 10 MG/10ML IV SOLN
INTRAVENOUS | Status: DC | PRN
  Administered 2014-02-23: 4 mg via INTRAVENOUS

## 2014-02-23 MED ORDER — 0.9 % SODIUM CHLORIDE (POUR BTL) OPTIME
TOPICAL | Status: DC | PRN
  Administered 2014-02-23: 1000 mL

## 2014-02-23 MED ORDER — POLYVINYL ALCOHOL-POVIDONE 1.4-0.6 % OP SOLN: 1.0000 [drp] | Freq: Two times a day (BID) | OPHTHALMIC | Status: DC

## 2014-02-23 SURGICAL SUPPLY — 38 items
ATTRACTOMAT 16X20 MAGNETIC DRP (DRAPES) ×2 IMPLANT
BENZOIN TINCTURE PRP APPL 2/3 (GAUZE/BANDAGES/DRESSINGS) ×2 IMPLANT
BLADE HEX COATED 2.75 (ELECTRODE) ×2 IMPLANT
BLADE SURG 15 STRL LF DISP TIS (BLADE) ×1 IMPLANT
BLADE SURG 15 STRL SS (BLADE) ×1
CANISTER SUCTION 2500CC (MISCELLANEOUS) IMPLANT
CHLORAPREP W/TINT 26ML (MISCELLANEOUS) ×2 IMPLANT
CLIP TI MEDIUM 6 (CLIP) ×6 IMPLANT
CLIP TI WIDE RED SMALL 6 (CLIP) ×6 IMPLANT
DISSECTOR ROUND CHERRY 3/8 STR (MISCELLANEOUS) ×2 IMPLANT
DRAPE PED LAPAROTOMY (DRAPES) ×2 IMPLANT
DRESSING SURGICEL FIBRLLR 1X2 (HEMOSTASIS) ×1 IMPLANT
DRSG SURGICEL FIBRILLAR 1X2 (HEMOSTASIS) ×2
ELECT REM PT RETURN 9FT ADLT (ELECTROSURGICAL) ×2
ELECTRODE REM PT RTRN 9FT ADLT (ELECTROSURGICAL) ×1 IMPLANT
GAUZE SPONGE 4X4 12PLY STRL (GAUZE/BANDAGES/DRESSINGS) ×2 IMPLANT
GAUZE SPONGE 4X4 16PLY XRAY LF (GAUZE/BANDAGES/DRESSINGS) ×4 IMPLANT
GLOVE SURG ORTHO 8.0 STRL STRW (GLOVE) ×2 IMPLANT
GOWN STRL REUS W/TWL XL LVL3 (GOWN DISPOSABLE) ×4 IMPLANT
KIT BASIN OR (CUSTOM PROCEDURE TRAY) ×2 IMPLANT
NS IRRIG 1000ML POUR BTL (IV SOLUTION) ×2 IMPLANT
PACK BASIC VI WITH GOWN DISP (CUSTOM PROCEDURE TRAY) ×2 IMPLANT
PEN SKIN MARKING BROAD (MISCELLANEOUS) ×2 IMPLANT
PENCIL BUTTON HOLSTER BLD 10FT (ELECTRODE) ×2 IMPLANT
SHEARS HARMONIC 9CM CVD (BLADE) ×2 IMPLANT
STAPLER VISISTAT 35W (STAPLE) IMPLANT
STRIP CLOSURE SKIN 1/2X4 (GAUZE/BANDAGES/DRESSINGS) ×2 IMPLANT
SUT MNCRL AB 4-0 PS2 18 (SUTURE) ×2 IMPLANT
SUT SILK 2 0 (SUTURE) ×1
SUT SILK 2-0 18XBRD TIE 12 (SUTURE) ×1 IMPLANT
SUT SILK 3 0 (SUTURE) ×1
SUT SILK 3-0 18XBRD TIE 12 (SUTURE) ×1 IMPLANT
SUT VIC AB 3-0 SH 18 (SUTURE) ×4 IMPLANT
SYR BULB IRRIGATION 50ML (SYRINGE) ×2 IMPLANT
TAPE CLOTH SURG 4X10 WHT LF (GAUZE/BANDAGES/DRESSINGS) ×2 IMPLANT
TOWEL OR 17X26 10 PK STRL BLUE (TOWEL DISPOSABLE) ×2 IMPLANT
TOWEL OR NON WOVEN STRL DISP B (DISPOSABLE) ×2 IMPLANT
YANKAUER SUCT BULB TIP 10FT TU (MISCELLANEOUS) ×2 IMPLANT

## 2014-02-23 NOTE — Plan of Care (Signed)
Problem: Phase I Progression Outcomes Goal: No signs/symptoms of infection/bleeding Outcome: Completed/Met Date Met:  02/23/14

## 2014-02-23 NOTE — Brief Op Note (Signed)
02/23/2014  9:19 AM  PATIENT:  Victoria Walker  63 y.o. female  PRE-OPERATIVE DIAGNOSIS:  Multinodular goiter with compressive symptoms  POST-OPERATIVE DIAGNOSIS:  same  PROCEDURE:  Completion thyroidectomy  SURGEON:  Surgeon(s) and Role:    * Armandina Gemma, MD - Primary  ANESTHESIA:   general  EBL:  Total I/O In: 1000 [I.V.:1000] Out: -   BLOOD ADMINISTERED:none  DRAINS: none   LOCAL MEDICATIONS USED:  NONE  SPECIMEN:  Excision  DISPOSITION OF SPECIMEN:  PATHOLOGY  COUNTS:  YES  TOURNIQUET:  * No tourniquets in log *  DICTATION: .Other Dictation: Dictation Number [id number]  PLAN OF CARE: Admit for overnight observation  PATIENT DISPOSITION:  PACU - hemodynamically stable.   Delay start of Pharmacological VTE agent (>24hrs) due to surgical blood loss or risk of bleeding: yes  Earnstine Regal, MD, [facility] Office: [phone]

## 2014-02-23 NOTE — Op Note (Signed)
Victoria Walker, Victoria Walker           ACCOUNT NO.:  [id number]  MEDICAL RECORD NO.:  [id number]  LOCATION:  [facility]                         FACILITY:  [facility]  PHYSICIAN:  Earnstine Regal, MD      DATE OF BIRTH:  [birth year]  DATE OF PROCEDURE:  02/23/2014                              OPERATIVE REPORT   PREOPERATIVE DIAGNOSIS:  Multinodular thyroid goiter with compressive symptoms.  POSTOPERATIVE DIAGNOSIS:  Multinodular thyroid goiter with compressive symptoms.  PROCEDURE:  Completion thyroidectomy.  SURGEON:  Armandina Gemma, MD, FACS  ANESTHESIA:  General.  ESTIMATED BLOOD LOSS:  Minimal.  PREPARATION:  ChloraPrep.  COMPLICATIONS:  None.  INDICATIONS:  The patient is a 63 year old female, who presents on referral from her endocrinologist for evaluation for total thyroidectomy for management of multinodular thyroid goiter.  The patient has a longstanding history of thyroid nodules.  She underwent partial thyroidectomy in 1976.  She has mild hypothyroidism and takes Synthroid 25 mcg daily.  The patient has developed compressive symptoms including globus sensation and occasional dysphagia.  Ultrasound shows bilateral thyroid nodules.  The patient now comes to Surgery for completion thyroidectomy.  BODY OF REPORT:  Procedure was done in OR #3 at the [facility].  The patient was brought to the operating room, placed in a supine position on the operating room table.  Following administration of general anesthesia, the patient was positioned and then prepped and draped in the usual aseptic fashion.  After ascertaining that an adequate level of anesthesia had been achieved, the previous Kocher incision was reopened with a #15 blade.  Dissection was carried through scar tissue and through the platysma.  Hemostasis was achieved with electrocautery.  Skin flaps were elevated cephalad and caudad from the thyroid notch to the sternal notch.  A Mahorner self- retaining  retractor was placed for exposure.  Old suture material was visible in the midline.  Strap muscles were divided in the midline and from the thyroid notch to the sternal notch.  Dissection was begun in the midline elevating the strap muscles off of the isthmus of the thyroid.  Again suture material and old scar tissue was encountered. Strap muscles were eventually freed from the anterior surface of the left thyroid lobe and reflected laterally.  Left lobe was enlarged and contained multiple nodules.  It was gently dissected out with blunt dissection.  Venous tributaries were divided between Ligaclips with the Harmonic scalpel.  Gland was gently mobilized.  Inferior parathyroid gland was identified and preserved on its vascular pedicle.  Superior pole vessels were dissected out and divided individually between small and medium Ligaclips with the Harmonic scalpel.  Superior parathyroid was identified and preserved.  There was a moderate-sized pyramidal lobe to the left of midline which was dissected off of the thyroid cartilage and resected with the isthmus.  Gland was rolled anteriorly and branches of the inferior thyroid artery were divided between small Ligaclips with the Harmonic scalpel.  Recurrent laryngeal nerve was identified and preserved.  Ligament of Gwenlyn Found was released with the electrocautery and the gland was mobilized onto the trachea.  Isthmus was mobilized across the midline.  Dry pack was placed in the left neck.  Next, we turned  our attention to the right thyroid lobe.  Again, the strap muscles were dissected off the anterior portion of the thyroid which was somewhat scarred and there was old suture material visible. Strap muscles were reflected to the right exposing the entire right thyroid lobe which was enlarged and multinodular.  Middle thyroid vein was divided between medium Ligaclips with the Harmonic scalpel. Superior pole was dissected out and divided and vessels  were divided individually between small and medium Ligaclips with the Harmonic scalpel.  Inferior venous tributaries were divided between small and medium Ligaclips with the Harmonic scalpel.  Gland was rolled anteriorly.  Branches of the inferior thyroid artery were divided between small Ligaclips.  Parathyroid tissue was identified and preserved.  Recurrent laryngeal nerve was identified and preserved. Ligament of Gwenlyn Found was released with the electrocautery and the gland was mobilized onto the anterior trachea from which it was completely resected with the Harmonic scalpel.  Suture was used to mark the right superior pole of the thyroid.  The entire gland was submitted to Pathology for review.  Neck was irrigated with warm saline and good hemostasis was noted throughout.  Fibrillar was placed throughout the operative field.  Strap muscles were reapproximated in the midline with interrupted 3-0 Vicryl sutures.  Platysma was closed with interrupted 3-0 Vicryl sutures.  Skin was closed with a running 4-0 Monocryl subcuticular suture.  Wound was washed and dried and benzoin and Steri-Strips were applied.  Sterile dressings were applied.  The patient was awakened from anesthesia and brought to the recovery room.  The patient tolerated the procedure well.   Earnstine Regal, MD, [facility] Office: [phone]   TMG/MEDQ  D:  02/23/2014  T:  02/23/2014  Job:  [id number]  cc:   Lanice Shirts, M.D.  Philemon Kingdom, M.D. Fax: [phone]

## 2014-02-23 NOTE — Plan of Care (Signed)
Problem: Phase I Progression Outcomes Goal: OOB as tolerated unless otherwise ordered Outcome: Completed/Met Date Met:  02/23/14     

## 2014-02-23 NOTE — H&P (Signed)
General Surgery [facility]  [name] Location: [facility] Patient #: [id number] DOB: [birth year] Married / Language: English / Race: Black or African American Female  History of Present Illness Patient words: f/u.  The patient is a 64 year old female who presents with a thyroid nodule. Patient is seen on referral from her endocrinologist, Dr. Benjiman Core. Her primary care physician is Dr. Karmen Bongo.  The patient has a long-standing history of thyroid nodules. She had undergone partial thyroidectomy in 1976.She has hypothyroidism and is on Synthroid 25 mcg daily.  Patient has developed mild compressive symptoms including globus and occasional dysphagia.Recent thyroid ultrasound shows a mildly enlarged thyroid gland with bilateral thyroid nodules.The largest nodule in the right measures 2.3 cm.The largest nodule on the left measured 3.1 cm.Fine-needle aspiration biopsies were obtained and were largely benign with the exception of a microfollicular pattern and a paucity of colloid.Patient was offered continued observation with repeat ultrasound and biopsy in 6 months.Alternatively, she would like to considerthyroidectomy.  Patient has a family history of thyroid disease in her mother and multiple sisters.Most have hypothyroidism and nodular disease.One sister was diagnosed with thyroid carcinoma and required total thyroidectomy and radiation treatment.There is no family history of other endocrinopathy.   Other Problems Anxiety Disorder Arthritis Back Pain Bladder Problems Breast Cancer Diabetes Mellitus Gastroesophageal Reflux Disease High blood pressure Hypercholesterolemia Lump In Breast Thyroid Disease Transfusion history  Past Surgical History Breast Biopsy Right. Breast Mass; Local Excision Right. Colon Polyp Removal - Colonoscopy Foot Surgery Left. Hysterectomy (not due to cancer) - Partial Mastectomy  Right. Thyroid Surgery  Diagnostic Studies History Colonoscopy 1-5 years ago Mammogram within last year Pap Smear 1-5 years ago  Allergies Penicillin G Potassium *PENICILLINS* SulfADIAZINE Sodium *SULFONAMIDES* SulfADIAZINE *Sulfonamides** Vicodin *ANALGESICS - OPIOID*  Medication History Lipitor (Oral) Specific dose unknown - Active. Oscal 500/200 D-3 (Oral) Specific dose unknown - Active. Optive Sensitive (Ophthalmic) Specific dose unknown - Active. Restasis (Ophthalmic) Specific dose unknown - Active. Tiazac (Oral) Specific dose unknown - Active. NexIUM (Oral) Specific dose unknown - Active. [name] Allergy (Oral) Specific dose unknown - Active. Fish Oil (Oral) Specific dose unknown - Active. Gabapentin (Oral) Specific dose unknown - Active. Avapro (Oral) Specific dose unknown - Active. LaMICtal ODT (Oral) Specific dose unknown - Active. Synthroid (Oral) Specific dose unknown - Active. Lotemax (0.5% Gel, Ophthalmic) Active. Lotemax (Ophthalmic) Specific dose unknown - Active. MetFORMIN HCl ER (Oral) Specific dose unknown - Active. Toprol XL (Oral) Specific dose unknown - Active. Multi-Minerals (Oral) Active. Multiple Vitamin (Oral) Specific dose unknown - Active. Nasonex (Nasal) Specific dose unknown - Active. Tramadol-Acetaminophen (Oral) Specific dose unknown - Active. Medications Reconciled  Social History No alcohol use No caffeine use No drug use Tobacco use Never smoker.  Family History Arthritis Sister. Breast Cancer Sister. Colon Cancer Sister. Diabetes Mellitus Brother, Father, Sister. Heart Disease Mother. Heart disease in female family member before age 21 Hypertension Brother, Daughter, Father, Mother, Sister. Kidney Disease Mother, Sister. Thyroid problems Sister.  Pregnancy / Birth History  Age at menarche 68 years. Age of menopause 16-50 Contraceptive History Oral contraceptives. Gravida  3 Irregular periods Maternal age 21-20 Para 3  Review of Systems  General Present- Appetite Loss, Fatigue and Night Sweats. Not Present- Chills, Fever, Weight Gain and Weight Loss. Skin Not Present- Change in Wart/Mole, Dryness, Hives, Jaundice, New Lesions, Non-Healing Wounds, Rash and Ulcer. HEENT Present- Seasonal Allergies, Sinus Pain and Wears glasses/contact lenses. Not Present- Earache, Hearing Loss, Hoarseness, Nose Bleed, Oral  Ulcers, Ringing in the Ears, Sore Throat, Visual Disturbances and Yellow Eyes. Neck Present- Neck Mass. Note: Occasional dysphagia.Globus sensation.   Respiratory Not Present- Bloody sputum, Chronic Cough, Difficulty Breathing, Snoring and Wheezing. Breast Not Present- Breast Mass, Breast Pain, Nipple Discharge and Skin Changes. Cardiovascular Not Present- Chest Pain, Difficulty Breathing Lying Down, Leg Cramps, Palpitations, Rapid Heart Rate, Shortness of Breath and Swelling of Extremities. Gastrointestinal Present- Difficulty Swallowing. Not Present- Abdominal Pain, Bloating, Bloody Stool, Change in Bowel Habits, Chronic diarrhea, Constipation, Excessive gas, Gets full quickly at meals, Hemorrhoids, Indigestion, Nausea, Rectal Pain and Vomiting. Female Genitourinary Not Present- Frequency, Nocturia, Painful Urination, Pelvic Pain and Urgency. Musculoskeletal Not Present- Back Pain, Joint Pain, Joint Stiffness, Muscle Pain, Muscle Weakness and Swelling of Extremities. Neurological Present- Tingling. Not Present- Decreased Memory, Fainting, Headaches, Numbness, Seizures, Tremor, Trouble walking and Weakness. Psychiatric Present- Anxiety. Not Present- Bipolar, Change in Sleep Pattern, Depression, Fearful and Frequent crying. Endocrine Present- Hot flashes. Not Present- Cold Intolerance, Excessive Hunger, Hair Changes, Heat Intolerance and New Diabetes. Hematology Present- Gland problems. Not Present- Easy Bruising, Excessive bleeding, HIV and Persistent  Infections.   Vitals Weight: 179 lb Height: 65in Body Surface Area: 1.93 m Body Mass Index: 29.79 kg/m Temp.: 97.25F(Oral)  Pulse: 90 (Regular)  BP: 150/82 (Sitting, Left Arm, Standard)    Physical Exam  General Mental Status-Alert. General Appearance-Consistent with stated age. Hydration-Well hydrated. Voice-Normal.  Head and Neck Head-normocephalic, atraumatic with no lesions or palpable masses. Trachea-midline. Thyroid -Note: Palpation reveals bilateral thyroid nodules smooth, mobile, nontender. There is a well-healed anterior cervical incision which is quite low on the neck with minimal keloid formation. Eye Eyeball - Bilateral-Extraocular movements intact. Sclera/Conjunctiva - Bilateral-No scleral icterus. Chest and Lung Exam Chest and lung exam reveals -quiet, even and easy respiratory effort with no use of accessory muscles, normal resonance, no flatness or dullness, non-tender and normal tactile fremitus and on auscultation, normal breath sounds, no adventitious sounds and normal vocal resonance. Inspection Chest Wall - Normal. Back - normal. Cardiovascular Cardiovascular examination reveals -on palpation PMI is normal in location and amplitude, no palpable S3 or S4. Normal cardiac borders., normal heart sounds, regular rate and rhythm with no murmurs, carotid auscultation reveals no bruits and normal pedal pulses bilaterally. Neurologic Neurologic evaluation reveals -alert and oriented x 3 with no impairment of recent or remote memory. Mental Status-Normal. Musculoskeletal Normal Exam - Left-Upper Extremity Strength Normal and Lower Extremity Strength Normal. Normal Exam - Right-Upper Extremity Strength Normal, Lower Extremity Weakness.    Assessment & Plan MULTIPLE THYROID NODULES (241.1  E04.2) Impression: I discussed the above findings with the patient and her friend at length. We reviewed the ultrasound report and  the pathology reports. The patient has 2 options for management going forward. First she may continue with scheduled ultrasound examination and possible repeat biopsy. The second option would be to proceed with total thyroidectomy. We discussed the procedure at length. We discussed the risk and benefits including recurrent laryngeal nerve injury and injury to parathyroid glands. We discussed the hospital stay to be anticipated and the postoperative recovery. We discussed the need for lifelong thyroid hormone replacement. We discussed the potential need for radioactive iodine treatment. Her risk of malignancy is approximately 15%.  Patient would like to proceed with total thyroidectomy in the near future. We will make arrangements for surgery at a time convenient for the patient.  The risks and benefits of the procedure have been discussed at length with the patient. The patient understands the  proposed procedure, potential alternative treatments, and the course of recovery to be expected. All of the patient's questions have been answered at this time. The patient wishes to proceed with surgery.  Earnstine Regal, MD, [facility] Office: [phone]

## 2014-02-23 NOTE — Anesthesia Postprocedure Evaluation (Signed)
  Anesthesia Post-op Note  Patient: Victoria Walker  Procedure(s) Performed: Procedure(s) (LRB): TOTAL THYROIDECTOMY (N/A)  Patient Location: PACU  Anesthesia Type: General  Level of Consciousness: awake and alert   Airway and Oxygen Therapy: Patient Spontanous Breathing  Post-op Pain: mild  Post-op Assessment: Post-op Vital signs reviewed, Patient's Cardiovascular Status Stable, Respiratory Function Stable, Patent Airway and No signs of Nausea or vomiting  Last Vitals:  Filed Vitals:   02/23/14 0514  BP: 130/85  Pulse: 73  Temp: 36.7 C  Resp: 18    Post-op Vital Signs: stable   Complications: No apparent anesthesia complications

## 2014-02-23 NOTE — Plan of Care (Signed)
Problem: Phase I Progression Outcomes Goal: Hemodynamically stable Outcome: Completed/Met Date Met:  02/23/14     

## 2014-02-23 NOTE — Plan of Care (Signed)
Problem: Phase I Progression Outcomes Goal: Pain controlled with appropriate interventions Outcome: Completed/Met Date Met:  02/23/14

## 2014-02-23 NOTE — Anesthesia Preprocedure Evaluation (Addendum)
Anesthesia Evaluation  Patient identified by MRN, date of birth, ID band Patient awake    Reviewed: Allergy & Precautions, H&P , NPO status , Patient's Chart, lab work & pertinent test results  History of Anesthesia Complications Negative for: history of anesthetic complications  Airway Mallampati: II  TM Distance: >3 FB Neck ROM: Full    Dental no notable dental hx. (+) Dental Advisory Given, Teeth Intact   Pulmonary neg pulmonary ROS,  breath sounds clear to auscultation  Pulmonary exam normal       Cardiovascular hypertension, Pt. on medications and Pt. on home beta blockers + dysrhythmias (reports palpitations and fast heart rate) Rhythm:Regular Rate:Normal     Neuro/Psych PSYCHIATRIC DISORDERS Anxiety Diabetic neuropathy     GI/Hepatic Neg liver ROS, hiatal hernia, GERD-  Controlled and Medicated,  Endo/Other  diabetes, Type 2, Oral Hypoglycemic AgentsHypothyroidism   Renal/GU negative Renal ROS  negative genitourinary   Musculoskeletal  (+) Arthritis -, Osteoarthritis,    Abdominal   Peds negative pediatric ROS (+)  Hematology negative hematology ROS (+)   Anesthesia Other Findings   Reproductive/Obstetrics negative OB ROS                            Anesthesia Physical Anesthesia Plan  ASA: III  Anesthesia Plan: General   Post-op Pain Management:    Induction: Intravenous  Airway Management Planned: Oral ETT  Additional Equipment:   Intra-op Plan:   Post-operative Plan: Extubation in OR  Informed Consent: I have reviewed the patients History and Physical, chart, labs and discussed the procedure including the risks, benefits and alternatives for the proposed anesthesia with the patient or authorized representative who has indicated his/her understanding and acceptance.   Dental advisory given  Plan Discussed with: CRNA  Anesthesia Plan Comments:          Anesthesia Quick Evaluation

## 2014-02-23 NOTE — Plan of Care (Signed)
Problem: Phase I Progression Outcomes Goal: No signs/symptoms of tetany Outcome: Completed/Met Date Met:  02/23/14

## 2014-02-23 NOTE — Plan of Care (Signed)
Problem: Phase I Progression Outcomes Goal: No dyspnea or voice changes Outcome: Completed/Met Date Met:  02/23/14

## 2014-02-23 NOTE — Transfer of Care (Signed)
Immediate Anesthesia Transfer of Care Note  Patient: Victoria Walker  Procedure(s) Performed: Procedure(s): TOTAL THYROIDECTOMY (N/A)  Patient Location: PACU  Anesthesia Type:General  Level of Consciousness: awake, alert , oriented and patient cooperative  Airway & Oxygen Therapy: Patient Spontanous Breathing and Patient connected to nasal cannula oxygen  Post-op Assessment: Report given to PACU RN, Post -op Vital signs reviewed and stable and Patient moving all extremities  Post vital signs: Reviewed and stable  Complications: No apparent anesthesia complications

## 2014-02-23 NOTE — Plan of Care (Signed)
Problem: Phase I Progression Outcomes Goal: Voiding-avoid urinary catheter unless indicated Outcome: Completed/Met Date Met:  02/23/14     

## 2014-02-24 ENCOUNTER — Encounter (HOSPITAL_COMMUNITY): Payer: Self-pay | Admitting: Surgery

## 2014-02-24 DIAGNOSIS — E042 Nontoxic multinodular goiter: Secondary | ICD-10-CM | POA: Diagnosis not present

## 2014-02-24 LAB — BASIC METABOLIC PANEL
Anion gap: 17 — ABNORMAL HIGH (ref 5–15)
BUN: 13 mg/dL (ref 6–23)
CO2: 20 mEq/L (ref 19–32)
Calcium: 9.1 mg/dL (ref 8.4–10.5)
Chloride: 100 mEq/L (ref 96–112)
Creatinine, Ser: 0.75 mg/dL (ref 0.50–1.10)
GFR calc Af Amer: >90 mL/min (ref >90)
GFR calc non Af Amer: 88 mL/min — ABNORMAL LOW (ref >90)
Glucose, Bld: 146 mg/dL — ABNORMAL HIGH (ref 70–99)
Potassium: 5 mEq/L (ref 3.7–5.3)
Sodium: 137 mEq/L (ref 137–147)

## 2014-02-24 LAB — GLUCOSE, CAPILLARY: Glucose-Capillary: 138 mg/dL — ABNORMAL HIGH (ref 70–99)

## 2014-02-24 MED ORDER — CALCIUM CARBONATE 1250 (500 CA) MG PO TABS: 2.0000 | ORAL_TABLET | Freq: Two times a day (BID) | ORAL | Status: DC

## 2014-02-24 MED ORDER — TRAMADOL HCL 50 MG PO TABS: 50.0000 mg | ORAL_TABLET | Freq: Four times a day (QID) | ORAL | Status: DC | PRN

## 2014-02-24 MED ORDER — LEVOTHYROXINE SODIUM 88 MCG PO TABS: 88.0000 ug | ORAL_TABLET | Freq: Every day | ORAL | Status: DC

## 2014-02-24 NOTE — Progress Notes (Signed)
RN reviewed discharge instructions with patient. All questions answered.   Paperwork and prescriptions given.   NT rolled patient down in wheelchair to family car.

## 2014-02-24 NOTE — Discharge Summary (Signed)
Physician Discharge Summary [facility]  Patient ID: Victoria Walker MRN: [id number] DOB/AGE: 11/29/1950 63 y.o.  Admit date: 02/23/2014 Discharge date: 02/24/2014  Admission Diagnoses:  Multinodular goiter  Discharge Diagnoses:  Principal Problem:   Multinodular non-toxic goiter Active Problems:   Multinodular goiter (nontoxic)   Discharged Condition: good  Hospital Course: Patient was admitted for observation following thyroid surgery.  Post op course was uncomplicated.  Pain was well controlled.  Tolerated diet.  Post op calcium level on morning following surgery was 9.1 mg/dl.  Patient was prepared for discharge home on POD#1.  Consults: None  Treatments: surgery: completion thryoidectomy  Discharge Exam: Blood pressure 113/64, pulse 71, temperature 98.6 F (37 C), temperature source Oral, resp. rate 16, height 5\' 5"  (1.651 m), weight 175 lb (79.379 kg), SpO2 99 %. HEENT - clear Neck - wound dry and intact; voice soft but normal Chest - clear bilaterally Cor - RRR   Disposition: Home  Discharge Instructions    Diet - low sodium heart healthy    Complete by:  As directed      Discharge instructions    Complete by:  As directed   THYROID & PARATHYROID SURGERY - POST OP INSTRUCTIONS  Always review your discharge instruction sheet from the facility where your surgery was performed.  A prescription for pain medication may be given to you upon discharge.  Take your pain medication as prescribed.  If narcotic pain medicine is not needed, then you may take acetaminophen (Tylenol) or ibuprofen (Advil) as needed.  Take your usually prescribed medications unless otherwise directed.  If you need a refill on your pain medication, please contact your pharmacy. They will contact our office to request authorization.  Prescriptions will not be processed after 5 pm or on weekends.  Start with a light diet upon arrival home, such as soup and crackers or toast.   Be sure to drink plenty of fluids daily.  Resume your normal diet the day after surgery.  Most patients will experience some swelling and bruising on the chest and neck area.  Ice packs will help.  Swelling and bruising can take several days to resolve.   It is common to experience some constipation if taking pain medication after surgery.  Increasing fluid intake and taking a stool softener will usually help or prevent this problem.  A mild laxative (Milk of Magnesia or Miralax) should be taken according to package directions if there are no bowel movements after 48 hours.  You may remove your bandages 24-48 hours after surgery, and you may shower at that time.  You have steri-strips (small skin tapes) in place directly over the incision.  These strips should be left on the skin for 7-10 days and then removed.  You may resume regular (light) daily activities beginning the next day-such as daily self-care, walking, climbing stairs-gradually increasing activities as tolerated.  You may have sexual intercourse when it is comfortable.  Refrain from any heavy lifting or straining until approved by your doctor.  You may drive when you no longer are taking prescription pain medication, you can comfortably wear a seatbelt, and you can safely maneuver your car and apply brakes.  You should see your doctor in the office for a follow-up appointment approximately two to three weeks after your surgery.  Make sure that you call for this appointment within a day or two after you arrive home to insure a convenient appointment time.  WHEN TO CALL YOUR DOCTOR: -- Fever  greater than 101.5 -- Inability to urinate -- Nausea and/or vomiting - persistent -- Extreme swelling or bruising -- Continued bleeding from incision -- Increased pain, redness, or drainage from the incision -- Difficulty swallowing or breathing -- Muscle cramping or spasms -- Numbness or tingling in hands or around lips  The clinic staff is  available to answer your questions during regular business hours.  Please don't hesitate to call and ask to speak to one of the nurses if you have concerns.  Earnstine Regal, MD, [facility] Office: [phone]     Increase activity slowly    Complete by:  As directed      Remove dressing in 24 hours    Complete by:  As directed             Medication List    TAKE these medications        atorvastatin 20 MG tablet  Commonly known as:  LIPITOR  Take 1 tablet (20 mg total) by mouth at bedtime.     calcium carbonate 1250 MG tablet  Commonly known as:  OS-CAL - dosed in mg of elemental calcium  Take 2 tablets (1,000 mg of elemental calcium total) by mouth 2 (two) times daily with a meal.     calcium-vitamin D 250-125 MG-UNIT per tablet  Commonly known as:  OSCAL WITH D  Take 1 tablet by mouth 2 (two) times daily.     Ciclopirox 1 % shampoo  Apply 1 each topically every 7 (seven) days.     cycloSPORINE 0.05 % ophthalmic emulsion  Commonly known as:  RESTASIS  Place 1 drop into both eyes 2 (two) times daily.     diltiazem 360 MG 24 hr capsule  Commonly known as:  TIAZAC  Take 360 mg by mouth every morning.     esomeprazole 40 MG capsule  Commonly known as:  NEXIUM  Take 40 mg by mouth every morning.     etodolac 200 MG capsule  Commonly known as:  LODINE  Take 1 capsule (200 mg total) by mouth 2 (two) times daily.     fexofenadine 180 MG tablet  Commonly known as:  [name]  Take 180 mg by mouth daily at 12 noon.     fish oil-omega-3 fatty acids 1000 MG capsule  Take 1 g by mouth every evening.     gabapentin 300 MG capsule  Commonly known as:  NEURONTIN  Take 300 mg by mouth at bedtime.     irbesartan 75 MG tablet  Commonly known as:  AVAPRO  Take 75 mg by mouth at bedtime.     levothyroxine 25 MCG tablet  Commonly known as:  SYNTHROID, LEVOTHROID  Take 25 mcg by mouth every morning.     levothyroxine  88 MCG tablet  Commonly known as:  SYNTHROID, LEVOTHROID  Take 1 tablet (88 mcg total) by mouth daily before breakfast.     loteprednol 0.5 % ophthalmic suspension  Commonly known as:  LOTEMAX  Place 1 drop into the left eye daily as needed ("arthiritis of the eye").     metFORMIN 750 MG 24 hr tablet  Commonly known as:  GLUCOPHAGE-XR  Take 750 mg by mouth 2 (two) times daily.     metoprolol succinate 25 MG 24 hr tablet  Commonly known as:  TOPROL-XL  Take 25 mg by mouth 2 (two) times daily.     multivitamin with minerals tablet  Take 1 tablet  by mouth daily with lunch.     NASONEX 50 MCG/ACT nasal spray  Generic drug:  mometasone  Place 2 sprays into the nose every morning.     PRESCRIPTION MEDICATION  Place 1 each vaginally 2 (two) times a week. Vitamin E Compound Vaginal suppository.     REFRESH 1.4-0.6 % ophthalmic solution  Generic drug:  polyvinyl alcohol-povidone  Place 1 drop into both eyes 2 (two) times daily.     spironolactone 25 MG tablet  Commonly known as:  ALDACTONE  Take 37.5 mg by mouth every morning.     traMADol 50 MG tablet  Commonly known as:  ULTRAM  Take 1 tablet (50 mg total) by mouth every 6 (six) hours as needed for moderate pain.     traMADol-acetaminophen 37.5-325 MG per tablet  Commonly known as:  ULTRACET  Take 1 tablet by mouth every 6 (six) hours as needed for moderate pain.     vitamin E 1000 UNIT capsule  Take 1,000 Units by mouth daily with lunch.         Earnstine Regal, MD, [facility] Office: [phone]   Signed: Earnstine Regal 02/24/2014, 9:50 AM

## 2014-02-26 NOTE — Progress Notes (Signed)
Quick Note:  Please contact patient and notify of benign pathology results.  Cleburne Savini M. Shalin Vonbargen, MD, FACS [facility] Office: [phone]   ______ 

## 2014-02-27 NOTE — Progress Notes (Signed)
Done

## 2014-02-28 ENCOUNTER — Other Ambulatory Visit (INDEPENDENT_AMBULATORY_CARE_PROVIDER_SITE_OTHER): Payer: Self-pay

## 2014-03-03 ENCOUNTER — Ambulatory Visit (INDEPENDENT_AMBULATORY_CARE_PROVIDER_SITE_OTHER): Payer: Medicare HMO | Admitting: Podiatry

## 2014-03-03 ENCOUNTER — Encounter: Payer: Self-pay | Admitting: Podiatry

## 2014-03-03 VITALS — BP 147/80 | HR 78

## 2014-03-03 DIAGNOSIS — M21962 Unspecified acquired deformity of left lower leg: Secondary | ICD-10-CM

## 2014-03-03 DIAGNOSIS — M722 Plantar fascial fibromatosis: Secondary | ICD-10-CM

## 2014-03-03 DIAGNOSIS — M79673 Pain in unspecified foot: Secondary | ICD-10-CM

## 2014-03-03 NOTE — Patient Instructions (Signed)
Seen for left heel pain. Cortisone injection given. Discussed surgical procedure to bring down the first Metatarsal bone during weight bearing.  Return as needed.

## 2014-03-03 NOTE — Progress Notes (Signed)
Subjective: 63 year old female presents complaining of pain in left heel. She was doing well till she start wearing anti skid socks at home since she had her Thyroid surgery on November 5th, 2015. She wears Orthotics.   Objective:  Burning and shooting pain at medial aspect of the left heel and extends to arch of the foot.  Orthopedic findings reveal elevated first ray left foot. Previous radiograph examination revealed short and elevated first ray left foot.   Assessment: STJ Pronation deformity.  Plantar fasciitis left heel.  Metatarsus primus elevatus left foot.  Plan: Reviewed clinical findings and available treatment options. Metatarsal binder dispensed for left foot. Left heel injected with mixture of 4 mg Dexamethasone, 4 mg Triamcinolone, and 1 cc of 0.5% Marcaine plain.  Patient tolerated well without difficulty.  Also reviewed Cotton osteotomy procedure to plantar flex the first ray if symptom persist. Return as needed.

## 2014-03-10 LAB — CALCIUM: Calcium: 9.7 mg/dL (ref 8.7–10.3)

## 2014-03-22 ENCOUNTER — Other Ambulatory Visit (INDEPENDENT_AMBULATORY_CARE_PROVIDER_SITE_OTHER): Payer: Self-pay

## 2014-03-22 DIAGNOSIS — E890 Postprocedural hypothyroidism: Secondary | ICD-10-CM

## 2014-03-30 LAB — TSH: TSH: 0.059 u[IU]/mL — ABNORMAL LOW (ref 0.450–4.500)

## 2014-04-12 ENCOUNTER — Encounter: Payer: Self-pay | Admitting: *Deleted

## 2014-05-01 ENCOUNTER — Ambulatory Visit: Payer: Medicare HMO | Admitting: Internal Medicine

## 2014-05-02 ENCOUNTER — Encounter: Payer: Self-pay | Admitting: Internal Medicine

## 2014-05-02 ENCOUNTER — Ambulatory Visit (INDEPENDENT_AMBULATORY_CARE_PROVIDER_SITE_OTHER): Payer: Medicare Other | Admitting: Internal Medicine

## 2014-05-02 VITALS — BP 127/80 | HR 77 | Resp 16 | Ht 64.75 in | Wt 174.0 lb

## 2014-05-02 DIAGNOSIS — E119 Type 2 diabetes mellitus without complications: Secondary | ICD-10-CM

## 2014-05-02 DIAGNOSIS — E039 Hypothyroidism, unspecified: Secondary | ICD-10-CM | POA: Diagnosis not present

## 2014-05-02 DIAGNOSIS — I10 Essential (primary) hypertension: Secondary | ICD-10-CM | POA: Diagnosis not present

## 2014-05-02 DIAGNOSIS — Z23 Encounter for immunization: Secondary | ICD-10-CM | POA: Diagnosis not present

## 2014-05-02 NOTE — Progress Notes (Signed)
Subjective:    Patient ID: Victoria Walker, female    DOB: [birth year], 64 y.o.   MRN: [id number]  HPI  01/2014 note HM: Flu vaccine today Pap per Dr. Garwin Brothers, Mm UTD colonosocopy 2016. 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   Dose of levothyroxine changed to 75 mcg  And she will start this dose in am .     DM  No parethesias  Tolerating glucophage.     Allergies  Allergen Reactions  . Penicillins Itching  . Sulfa Antibiotics Itching  . Codeine Anxiety    Jittery   . Vicodin [Hydrocodone-Acetaminophen]     ITCHING    Past Medical History  Diagnosis Date  . Hypertension   . Neuropathy     FEET  . High cholesterol   . Hypothyroidism   . GERD (gastroesophageal reflux disease)   . Cancer     breast cancer  . Dysrhythmia     HX OF FAST HEART RATE AND PALPITATIONS - METOPROLOL HAS HELPED  . Multinodular thyroid     PT HAVING TROUBLE SWALLOWING  . Diabetes mellitus without complication     type 2 - ORAL MEDICATION - NO INSULIN  . H/O hiatal hernia   . Arthritis     knees, back; PT PLANS RIGHT TOTAL KNEE SURGERY IN THE FUTURE  . Claustrophobia   . H/O iritis     LEFT EYE - STATES HER EYE BECOMES RED AND VERY SENSITIVE TO LIGHT WHEN FLARE UP OF IRITIS   Past Surgical History  Procedure  Laterality Date  . Thyroid operation  1976    removed tumor  . Abdominal hysterectomy  1988  . Mastectomy  2007    right  . Plantar fascia surgery Left   . Tummy tuck  1986  . Hernia repair  1986  . Cardiac catheterization  1999  . Anterior and posterior repair  05/11/2012    Procedure: ANTERIOR (CYSTOCELE) AND POSTERIOR REPAIR (RECTOCELE);  Surgeon: Reece Packer, MD;  Location: [facility];  Service: Urology;;  with graft  . Vaginal prolapse repair  05/11/2012    Procedure: VAGINAL VAULT SUSPENSION;  Surgeon: Reece Packer, MD;  Location: [facility];  Service: Urology;;  . Cystoscopy with urethral dilatation  05/11/2012    Procedure: CYSTOSCOPY WITH URETHRAL DILATATION;  Surgeon: Reece Packer, MD;  Location: [facility];  Service: Urology;;  . Thyroidectomy N/A 02/23/2014    Procedure: TOTAL THYROIDECTOMY;  Surgeon: Armandina Gemma, MD;  Location: [facility];  Service: General;  Laterality: N/A;   History   Social History  . Marital Status: Married    Spouse Name: N/A    Number of Children: 3  . Years of Education: N/A  Occupational History  .      Retired   Social History Main Topics  . Smoking status: Never Smoker   . Smokeless tobacco: Never Used  . Alcohol Use: No  . Drug Use: No  . Sexual Activity: Yes    Birth Control/ Protection: Surgical   Other Topics Concern  . Not on file   Social History Narrative   Family History  Problem Relation Age of Onset  . Hypertension Mother   . Hypertension Father   . Diabetes Sister   . Cancer Sister   . Hypertension Sister   . Diabetes Brother   . Hypertension Brother   . Hypertension Maternal Aunt   . Hypertension Maternal Uncle   . Hypertension Paternal Aunt   . Cancer Paternal Aunt   . Hypertension Paternal Uncle   . Hypertension Maternal Grandmother   . Hypertension Maternal Grandfather   . Hypertension Paternal Grandmother   . Hypertension Paternal Grandfather    Patient Active Problem List   Diagnosis Date Noted    . Deformity of metatarsal bone of left foot 03/03/2014  . Multinodular goiter (nontoxic) 02/23/2014  . Diabetes 01/23/2014  . Hyperlipidemia 01/23/2014  . Vulvar irritation  S/P neg vulvar bx 12/2013 01/23/2014  . 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 Right knee DJD 07/12/2013  . Neuralgia of left foot 06/24/2013  . Tarsal tunnel syndrome 04/08/2013  . Vaginal dryness, menopausal 04/05/2013  . Pronation deformity of ankle, acquired 02/09/2013  . Diabetic neuropathy with neurologic complication 60/73/7106  . Ingrown nail 02/02/2013  . H/O bone density study 01/26/2013  . Chest pain 01/05/2013  . History of iritis 01/04/2013  . GERD (gastroesophageal reflux disease) 10/05/2012  . Type II or unspecified type diabetes mellitus without mention of complication, uncontrolled 10/04/2012  . Essential hypertension, benign 10/04/2012  . Other and unspecified hyperlipidemia 10/04/2012  . 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 Diabetic neuropathy 10/04/2012  . S/P hysterectomy 10/04/2012  . Osteopenia 10/04/2012  . Hypothyroidism 10/04/2012  . History of cystocele 10/04/2012  . Dyspareunia 10/04/2012  . Plantar fasciitis 10/04/2012  . Breast cancer 05/06/2011   Current Outpatient Prescriptions on File Prior to Visit  Medication Sig Dispense Refill  . atorvastatin (LIPITOR) 20 MG tablet Take 1 tablet (20 mg total) by mouth at bedtime. 90 tablet 1  . calcium carbonate (OS-CAL - DOSED IN MG OF ELEMENTAL CALCIUM) 1250 MG tablet Take 2 tablets (1,000 mg of elemental calcium total) by mouth 2 (two) times daily with a meal. 60 tablet 1  . calcium-vitamin D (OSCAL WITH D) 250-125 MG-UNIT per tablet Take 1 tablet by mouth 2 (two) times daily.     . Ciclopirox 1 % shampoo Apply 1 each topically every 7 (seven) days.     . cycloSPORINE (RESTASIS) 0.05 % ophthalmic emulsion Place 1 drop into both eyes 2 (two) times daily.    Marland Kitchen diltiazem (TIAZAC) 360 MG 24 hr capsule Take 360 mg by mouth every morning.      Marland Kitchen esomeprazole (NEXIUM) 40 MG capsule Take 40 mg by mouth every morning.    . etodolac (LODINE) 200 MG capsule Take 1 capsule (200 mg total) by mouth 2 (two) times daily. 60 capsule 0  . fexofenadine ([name]) 180 MG tablet Take 180 mg by mouth daily at 12 noon.    . fish oil-omega-3 fatty acids 1000 MG capsule Take 1 g by mouth every evening.     . gabapentin (NEURONTIN) 300 MG capsule Take 300 mg by mouth  at bedtime.     . irbesartan (AVAPRO) 75 MG tablet Take 75 mg by mouth at bedtime.    Marland Kitchen levothyroxine (SYNTHROID, LEVOTHROID) 25 MCG tablet Take 25 mcg by mouth every morning.    Marland Kitchen levothyroxine (SYNTHROID, LEVOTHROID) 88 MCG tablet Take 1 tablet (88 mcg total) by mouth daily before breakfast. 30 tablet 3  . loteprednol (LOTEMAX) 0.5 % ophthalmic suspension Place 1 drop into the left eye daily as needed ("arthiritis of the eye").     . metFORMIN (GLUCOPHAGE-XR) 750 MG 24 hr tablet Take 750 mg by mouth 2 (two) times daily.     . metoprolol succinate (TOPROL-XL) 25 MG 24 hr tablet Take 25 mg by mouth 2 (two) times daily.     . Multiple Vitamins-Minerals (MULTIVITAMIN WITH MINERALS) tablet Take 1 tablet by mouth daily with lunch.     Marland Kitchen NASONEX 50 MCG/ACT nasal spray Place 2 sprays into the nose every morning.     . polyvinyl alcohol-povidone (REFRESH) 1.4-0.6 % ophthalmic solution Place 1 drop into both eyes 2 (two) times daily.    Marland Kitchen PRESCRIPTION MEDICATION Place 1 each vaginally 2 (two) times a week. Vitamin E Compound Vaginal suppository.    Marland Kitchen spironolactone (ALDACTONE) 25 MG tablet Take 37.5 mg by mouth every morning.     . traMADol (ULTRAM) 50 MG tablet Take 1 tablet (50 mg total) by mouth every 6 (six) hours as needed for moderate pain. 30 tablet 0  . traMADol-acetaminophen (ULTRACET) 37.5-325 MG per tablet Take 1 tablet by mouth every 6 (six) hours as needed for moderate pain.    . vitamin E 1000 UNIT capsule Take 1,000 Units by mouth daily with lunch.      No current  facility-administered medications on file prior to visit.      Review of Systems  See HPI  Objective:   Physical Exam  Physical Exam  Nursing note and vitals reviewed.  Constitutional: She is oriented to person, place, and time. She appears well-developed and well-nourished.  HENT:  Head: Normocephalic and atraumatic.  Cardiovascular: Normal rate and regular rhythm. Exam reveals no gallop and no friction rub.  No murmur heard.  Pulmonary/Chest: Breath sounds normal. She has no wheezes. She has no rales.  Neurological: She is alert and oriented to person, place, and time.  Skin: Skin is warm and dry.  Psychiatric: She has a normal mood and affect. Her behavior is normal.             Assessment & Plan:  DM  Will  Check  AIC  ,  Micral ,  CMP today  Foot exam normal   hypolthyroidism  S/p total thyroidectomy  She will start  Levothyroxine 75 mcg in am  Will see pt 8 weeks  HTN  Good control   See me 6-8 weeks

## 2014-05-02 NOTE — Patient Instructions (Signed)
See me in 6-8 weeks 

## 2014-05-03 LAB — COMPLETE METABOLIC PANEL WITH GFR
ALT: 18 U/L (ref 0–35)
AST: 15 U/L (ref 0–37)
Albumin: 4 g/dL (ref 3.5–5.2)
Alkaline Phosphatase: 61 U/L (ref 39–117)
BUN: 11 mg/dL (ref 6–23)
CO2: 31 mEq/L (ref 19–32)
Calcium: 9.3 mg/dL (ref 8.4–10.5)
Chloride: 102 mEq/L (ref 96–112)
Creat: 0.72 mg/dL (ref 0.50–1.10)
GFR, Est African American: >89 mL/min
GFR, Est Non African American: 89 mL/min
Glucose, Bld: 80 mg/dL (ref 70–99)
Potassium: 4.9 mEq/L (ref 3.5–5.3)
Sodium: 141 mEq/L (ref 135–145)
Total Bilirubin: 0.5 mg/dL (ref 0.2–1.2)
Total Protein: 6.3 g/dL (ref 6.0–8.3)

## 2014-05-03 LAB — MICROALBUMIN / CREATININE URINE RATIO
Creatinine, Urine: 124.7 mg/dL
Microalb Creat Ratio: 3.2 mg/g (ref 0.0–30.0)
Microalb, Ur: 0.4 mg/dL (ref <2.0)

## 2014-05-03 LAB — HEMOGLOBIN A1C
Hgb A1c MFr Bld: 6.5 % — ABNORMAL HIGH (ref <5.7)
Mean Plasma Glucose: 140 mg/dL — ABNORMAL HIGH (ref <117)

## 2014-05-03 NOTE — Progress Notes (Signed)
Mailed a copy of labs to patient

## 2014-05-23 DIAGNOSIS — H40023 Open angle with borderline findings, high risk, bilateral: Secondary | ICD-10-CM | POA: Insufficient documentation

## 2014-05-25 DIAGNOSIS — E119 Type 2 diabetes mellitus without complications: Secondary | ICD-10-CM | POA: Insufficient documentation

## 2014-06-28 ENCOUNTER — Encounter: Payer: Self-pay | Admitting: *Deleted

## 2014-06-29 ENCOUNTER — Other Ambulatory Visit: Payer: Self-pay | Admitting: Internal Medicine

## 2014-06-29 NOTE — Telephone Encounter (Signed)
Refill request

## 2014-07-04 ENCOUNTER — Encounter: Payer: Self-pay | Admitting: Internal Medicine

## 2014-07-04 ENCOUNTER — Ambulatory Visit (INDEPENDENT_AMBULATORY_CARE_PROVIDER_SITE_OTHER): Payer: Medicare Other | Admitting: Internal Medicine

## 2014-07-04 VITALS — BP 126/74 | HR 72 | Resp 16 | Ht 64.75 in | Wt 175.0 lb

## 2014-07-04 DIAGNOSIS — E038 Other specified hypothyroidism: Secondary | ICD-10-CM | POA: Diagnosis not present

## 2014-07-04 DIAGNOSIS — E08618 Diabetes mellitus due to underlying condition with other diabetic arthropathy: Secondary | ICD-10-CM

## 2014-07-04 LAB — TSH: TSH: 0.202 u[IU]/mL — ABNORMAL LOW (ref 0.350–4.500)

## 2014-07-04 NOTE — Progress Notes (Signed)
Subjective:    Patient ID: Victoria Walker, female    DOB: [birth year], 64 y.o.   MRN: [id number]  HPI  05/02/2014 note Assessment & Plan:  DM Will Check AIC , Micral , CMP today Foot exam normal   hypolthyroidism S/p total thyroidectomy She will start Levothyroxine 75 mcg in am Will see pt 8 weeks  HTN Good control   See me 6-8 weeks         TODAY  Victoria Walker is here for follow up.  She had started a new dose of levothyroxine on the above date   Allergies  Allergen Reactions  . Penicillins Itching  . Sulfa Antibiotics Itching  . Codeine Anxiety    Jittery   . Vicodin [Hydrocodone-Acetaminophen]     ITCHING    Past Medical History  Diagnosis Date  . Hypertension   . Neuropathy     FEET  . High cholesterol   . Hypothyroidism   . GERD (gastroesophageal reflux disease)   . Cancer     breast cancer  . Dysrhythmia     HX OF FAST HEART RATE AND PALPITATIONS - METOPROLOL HAS HELPED  . Multinodular thyroid     PT HAVING TROUBLE SWALLOWING  . Diabetes mellitus without complication     type 2 - ORAL MEDICATION - NO INSULIN  . H/O hiatal hernia   . Arthritis     knees, back; PT PLANS RIGHT TOTAL KNEE SURGERY IN THE FUTURE  . Claustrophobia   . H/O iritis     LEFT EYE - STATES HER EYE BECOMES RED AND VERY SENSITIVE TO LIGHT WHEN FLARE UP OF IRITIS   Past Surgical History  Procedure Laterality Date  . Thyroid operation  1976    removed tumor  . Abdominal hysterectomy  1988  . Mastectomy  2007    right  . Plantar fascia surgery Left   . Tummy tuck  1986  . Hernia repair  1986  . Cardiac catheterization  1999  . Anterior and posterior repair  05/11/2012    Procedure: ANTERIOR (CYSTOCELE) AND POSTERIOR REPAIR (RECTOCELE);  Surgeon: Reece Packer, MD;  Location: [facility];  Service: Urology;;  with graft  . Vaginal prolapse repair  05/11/2012    Procedure: VAGINAL VAULT SUSPENSION;  Surgeon: Reece Packer, MD;  Location: [facility];  Service:  Urology;;  . Cystoscopy with urethral dilatation  05/11/2012    Procedure: CYSTOSCOPY WITH URETHRAL DILATATION;  Surgeon: Reece Packer, MD;  Location: [facility];  Service: Urology;;  . Thyroidectomy N/A 02/23/2014    Procedure: TOTAL THYROIDECTOMY;  Surgeon: Armandina Gemma, MD;  Location: [facility];  Service: General;  Laterality: N/A;   History   Social History  . Marital Status: Married    Spouse Name: N/A  . Number of Children: 3  . Years of Education: N/A   Occupational History  .      Retired   Social History Main Topics  . Smoking status: Never Smoker   . Smokeless tobacco: Never Used  . Alcohol Use: No  . Drug Use: No  . Sexual Activity: Yes    Birth Control/ Protection: Surgical   Other Topics Concern  . Not on file   Social History Narrative   Family History  Problem Relation Age of Onset  . Hypertension Mother   . Hypertension Father   . Diabetes Sister   . Cancer Sister   . Hypertension Sister   . Diabetes Brother   . Hypertension Brother   .  Hypertension Maternal Aunt   . Hypertension Maternal Uncle   . Hypertension Paternal Aunt   . Cancer Paternal Aunt   . Hypertension Paternal Uncle   . Hypertension Maternal Grandmother   . Hypertension Maternal Grandfather   . Hypertension Paternal Grandmother   . Hypertension Paternal Grandfather    Patient Active Problem List   Diagnosis Date Noted  . Deformity of metatarsal bone of left foot 03/03/2014  . Multinodular goiter (nontoxic) 02/23/2014  . Diabetes 01/23/2014  . Hyperlipidemia 01/23/2014  . Vulvar irritation  S/P neg vulvar bx 12/2013 01/23/2014  . 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 Right knee DJD 07/12/2013  . Neuralgia of left foot 06/24/2013  . Tarsal tunnel syndrome 04/08/2013  . Vaginal dryness, menopausal 04/05/2013  . Pronation deformity of ankle, acquired 02/09/2013  . Diabetic neuropathy with neurologic complication 69/48/5462  . Ingrown nail 02/02/2013  . H/O bone density study  01/26/2013  . Chest pain 01/05/2013  . History of iritis 01/04/2013  . GERD (gastroesophageal reflux disease) 10/05/2012  . Type II or unspecified type diabetes mellitus without mention of complication, uncontrolled 10/04/2012  . Essential hypertension, benign 10/04/2012  . Other and unspecified hyperlipidemia 10/04/2012  . 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 Diabetic neuropathy 10/04/2012  . S/P hysterectomy 10/04/2012  . Osteopenia 10/04/2012  . Hypothyroidism 10/04/2012  . History of cystocele 10/04/2012  . Dyspareunia 10/04/2012  . Plantar fasciitis 10/04/2012  . Breast cancer 05/06/2011   Current Outpatient Prescriptions on File Prior to Visit  Medication Sig Dispense Refill  . atorvastatin (LIPITOR) 20 MG tablet Take 1 tablet (20 mg total) by mouth at bedtime. 90 tablet 1  . calcium carbonate (OS-CAL - DOSED IN MG OF ELEMENTAL CALCIUM) 1250 MG tablet Take 2 tablets (1,000 mg of elemental calcium total) by mouth 2 (two) times daily with a meal. 60 tablet 1  . calcium-vitamin D (OSCAL WITH D) 250-125 MG-UNIT per tablet Take 1 tablet by mouth 2 (two) times daily.     . Ciclopirox 1 % shampoo Apply 1 each topically every 7 (seven) days.     . cycloSPORINE (RESTASIS) 0.05 % ophthalmic emulsion Place 1 drop into both eyes 2 (two) times daily.    Marland Kitchen diltiazem (TIAZAC) 360 MG 24 hr capsule Take 360 mg by mouth every morning.     Marland Kitchen esomeprazole (NEXIUM) 40 MG capsule Take 40 mg by mouth every morning.    . etodolac (LODINE) 200 MG capsule Take 1 capsule (200 mg total) by mouth 2 (two) times daily. 60 capsule 0  . fish oil-omega-3 fatty acids 1000 MG capsule Take 1 g by mouth every evening.     . gabapentin (NEURONTIN) 300 MG capsule Take 300 mg by mouth at bedtime.     . irbesartan (AVAPRO) 75 MG tablet Take 75 mg by mouth at bedtime.    Marland Kitchen levocetirizine (XYZAL) 5 MG tablet Take 5 mg by mouth every evening.    Marland Kitchen levothyroxine (SYNTHROID, LEVOTHROID) 75 MCG tablet Take 75 mcg by mouth daily  before breakfast.    . loteprednol (LOTEMAX) 0.5 % ophthalmic suspension Place 1 drop into the left eye daily as needed ("arthiritis of the eye").     . metFORMIN (GLUCOPHAGE-XR) 750 MG 24 hr tablet Take 750 mg by mouth 2 (two) times daily.     . metFORMIN (GLUCOPHAGE-XR) 750 MG 24 hr tablet TAKE 1 TABLET IN AM AND 1 TABLET AT LUNCH 180 tablet 1  . metoprolol succinate (TOPROL-XL) 25 MG 24 hr  tablet Take 25 mg by mouth 2 (two) times daily.     . metoprolol succinate (TOPROL-XL) 25 MG 24 hr tablet TAKE 1 TABLET BY MOUTH TWICE A DAY AT 12 NOON AND 4PM 180 tablet 1  . Multiple Vitamins-Minerals (MULTIVITAMIN WITH MINERALS) tablet Take 1 tablet by mouth daily with lunch.     Marland Kitchen NASONEX 50 MCG/ACT nasal spray Place 2 sprays into the nose every morning.     . polyvinyl alcohol-povidone (REFRESH) 1.4-0.6 % ophthalmic solution Place 1 drop into both eyes 2 (two) times daily.    Marland Kitchen PRESCRIPTION MEDICATION Place 1 each vaginally 2 (two) times a week. Vitamin E Compound Vaginal suppository.    Marland Kitchen spironolactone (ALDACTONE) 25 MG tablet Take 37.5 mg by mouth every morning.     . vitamin E 1000 UNIT capsule Take 1,000 Units by mouth daily with lunch.      No current facility-administered medications on file prior to visit.      Review of Systems See HPI    Objective:   Physical Exam  Physical Exam  Nursing note and vitals reviewed.  Constitutional: She is oriented to person, place, and time. She appears well-developed and well-nourished.  HENT:  Head: Normocephalic and atraumatic.  Cardiovascular: Normal rate and regular rhythm. Exam reveals no gallop and no friction rub.  No murmur heard.  Pulmonary/Chest: Breath sounds normal. She has no wheezes. She has no rales.  Neurological: She is alert and oriented to person, place, and time.  Skin: Skin is warm and dry.  Psychiatric: She has a normal mood and affect. Her behavior is normal.             Assessment & Plan:  Hypothyroidism  Will  check today  She is on 75 mcg daily  DM  AIC great.    See me in 3-4 months

## 2014-07-05 ENCOUNTER — Telehealth: Payer: Self-pay | Admitting: Internal Medicine

## 2014-07-05 MED ORDER — LEVOTHYROXINE SODIUM 75 MCG PO TABS: ORAL_TABLET | ORAL | Status: DC

## 2014-07-05 NOTE — Telephone Encounter (Signed)
I spoke with Joycelyn Schmid in regards to her thyroid medication adjustment. She voiced understanding.

## 2014-07-05 NOTE — Telephone Encounter (Signed)
Call pt and tell her we need to make minor adjustment to her thyroid medication.  Advise to take one whole tablet mon-Friday and one half tablet Saturday and Sunday.    Can have thyroid labs checked next time she has a diabetes check up approx 3 months

## 2014-07-21 LAB — HM COLONOSCOPY: HM Colonoscopy: 2

## 2014-07-24 ENCOUNTER — Encounter: Payer: Self-pay | Admitting: *Deleted

## 2014-08-04 ENCOUNTER — Other Ambulatory Visit: Payer: Self-pay | Admitting: *Deleted

## 2014-08-04 NOTE — Telephone Encounter (Signed)
Refill request

## 2014-08-06 MED ORDER — DILTIAZEM HCL ER BEADS 360 MG PO CP24: 360.0000 mg | ORAL_CAPSULE | Freq: Every morning | ORAL | Status: DC

## 2014-09-06 ENCOUNTER — Ambulatory Visit: Payer: Medicare HMO | Admitting: Family

## 2014-09-22 ENCOUNTER — Other Ambulatory Visit: Payer: Self-pay | Admitting: Family Medicine

## 2014-09-26 ENCOUNTER — Ambulatory Visit (INDEPENDENT_AMBULATORY_CARE_PROVIDER_SITE_OTHER): Payer: Medicare Other | Admitting: Family Medicine

## 2014-09-26 ENCOUNTER — Encounter: Payer: Self-pay | Admitting: Family Medicine

## 2014-09-26 VITALS — BP 132/80 | HR 82 | Temp 98.4°F | Ht 65.0 in | Wt 174.1 lb

## 2014-09-26 DIAGNOSIS — E118 Type 2 diabetes mellitus with unspecified complications: Secondary | ICD-10-CM | POA: Diagnosis not present

## 2014-09-26 DIAGNOSIS — Z8619 Personal history of other infectious and parasitic diseases: Secondary | ICD-10-CM | POA: Diagnosis not present

## 2014-09-26 DIAGNOSIS — E1140 Type 2 diabetes mellitus with diabetic neuropathy, unspecified: Secondary | ICD-10-CM

## 2014-09-26 DIAGNOSIS — E785 Hyperlipidemia, unspecified: Secondary | ICD-10-CM | POA: Diagnosis not present

## 2014-09-26 DIAGNOSIS — C50919 Malignant neoplasm of unspecified site of unspecified female breast: Secondary | ICD-10-CM

## 2014-09-26 DIAGNOSIS — E1149 Type 2 diabetes mellitus with other diabetic neurological complication: Secondary | ICD-10-CM

## 2014-09-26 DIAGNOSIS — E039 Hypothyroidism, unspecified: Secondary | ICD-10-CM

## 2014-09-26 DIAGNOSIS — I10 Essential (primary) hypertension: Secondary | ICD-10-CM

## 2014-09-26 DIAGNOSIS — E1142 Type 2 diabetes mellitus with diabetic polyneuropathy: Secondary | ICD-10-CM

## 2014-09-26 DIAGNOSIS — M1711 Unilateral primary osteoarthritis, right knee: Secondary | ICD-10-CM

## 2014-09-26 DIAGNOSIS — M179 Osteoarthritis of knee, unspecified: Secondary | ICD-10-CM

## 2014-09-26 DIAGNOSIS — K219 Gastro-esophageal reflux disease without esophagitis: Secondary | ICD-10-CM

## 2014-09-26 HISTORY — DX: Personal history of other infectious and parasitic diseases: Z86.19

## 2014-09-26 LAB — HEMOGLOBIN A1C: Hgb A1c MFr Bld: 6.3 % (ref 4.6–6.5)

## 2014-09-26 LAB — COMPREHENSIVE METABOLIC PANEL
ALT: 18 U/L (ref 0–35)
AST: 14 U/L (ref 0–37)
Albumin: 4.7 g/dL (ref 3.5–5.2)
Alkaline Phosphatase: 72 U/L (ref 39–117)
BUN: 18 mg/dL (ref 6–23)
CO2: 26 mEq/L (ref 19–32)
Calcium: 10.1 mg/dL (ref 8.4–10.5)
Chloride: 102 mEq/L (ref 96–112)
Creatinine, Ser: 0.82 mg/dL (ref 0.40–1.20)
GFR: 90.02 mL/min (ref >60.00)
Glucose, Bld: 87 mg/dL (ref 70–99)
Potassium: 4.2 mEq/L (ref 3.5–5.1)
Sodium: 137 mEq/L (ref 135–145)
Total Bilirubin: 0.4 mg/dL (ref 0.2–1.2)
Total Protein: 7.4 g/dL (ref 6.0–8.3)

## 2014-09-26 LAB — CBC
HCT: 43.5 % (ref 36.0–46.0)
Hemoglobin: 14.2 g/dL (ref 12.0–15.0)
MCHC: 32.7 g/dL (ref 30.0–36.0)
MCV: 93.6 fl (ref 78.0–100.0)
Platelets: 299 10*3/uL (ref 150.0–400.0)
RBC: 4.65 Mil/uL (ref 3.87–5.11)
RDW: 13.2 % (ref 11.5–15.5)
WBC: 10.6 10*3/uL — ABNORMAL HIGH (ref 4.0–10.5)

## 2014-09-26 LAB — LIPID PANEL
Cholesterol: 211 mg/dL — ABNORMAL HIGH (ref 0–200)
HDL: 57.8 mg/dL (ref >39.00)
NonHDL: 153.2
Total CHOL/HDL Ratio: 4
Triglycerides: 234 mg/dL — ABNORMAL HIGH (ref 0.0–149.0)
VLDL: 46.8 mg/dL — ABNORMAL HIGH (ref 0.0–40.0)

## 2014-09-26 LAB — LDL CHOLESTEROL, DIRECT: Direct LDL: 118 mg/dL

## 2014-09-26 LAB — TSH: TSH: 1.32 u[IU]/mL (ref 0.35–4.50)

## 2014-09-26 NOTE — Patient Instructions (Signed)

## 2014-09-26 NOTE — Progress Notes (Signed)
Victoria Walker  [id number] Sep 12, 1950 09/26/2014      Progress Note-Follow Up  Subjective  Chief Complaint  Chief Complaint  Patient presents with  . Establish Care    HPI  Patient is a 64 y.o. female in today for routine medical care. She is in today to establish care as her previous PMD has left practice. She has a corticated past medical history that includes diabetes mellitus type 2 with neuropathy, hypertension, hyperlipidemia, reflux, breast cancer. She was diagnosed and treated initially with her breast cancer in 2007 and took her Imitrex for 5 years has been released now from Psychologist, sport and exercise and oncology and doing well. Needs a prescription for broad prosthesis and diabetic shoes at this time. No recent illness or acute concerns. Denies CP/palp/SOB/HA/congestion/fevers/GI or GU c/o. Taking meds as prescribed  Past Medical History  Diagnosis Date  . Hypertension   . Neuropathy     FEET  . High cholesterol   . Hypothyroidism   . GERD (gastroesophageal reflux disease)   . Cancer     breast cancer  . Dysrhythmia     HX OF FAST HEART RATE AND PALPITATIONS - METOPROLOL HAS HELPED  . Multinodular thyroid     PT HAVING TROUBLE SWALLOWING  . Diabetes mellitus without complication     type 2 - ORAL MEDICATION - NO INSULIN  . H/O hiatal hernia   . Arthritis     knees, back; PT PLANS RIGHT TOTAL KNEE SURGERY IN THE FUTURE  . Claustrophobia   . H/O iritis     LEFT EYE - STATES HER EYE BECOMES RED AND VERY SENSITIVE TO LIGHT WHEN FLARE UP OF IRITIS  . History of chickenpox 09/26/2014  . History of measles   . History of shingles   . History of MRSA infection     Past Surgical History  Procedure Laterality Date  . Thyroid operation  1976    removed tumor  . Abdominal hysterectomy  1988  . Mastectomy  2007    right  . Plantar fascia surgery Left   . Tummy tuck  1986  . Hernia repair  1986  . Cardiac catheterization  1999  . Anterior and posterior repair  05/11/2012   Procedure: ANTERIOR (CYSTOCELE) AND POSTERIOR REPAIR (RECTOCELE);  Surgeon: Reece Packer, MD;  Location: [facility];  Service: Urology;;  with graft  . Vaginal prolapse repair  05/11/2012    Procedure: VAGINAL VAULT SUSPENSION;  Surgeon: Reece Packer, MD;  Location: [facility];  Service: Urology;;  . Cystoscopy with urethral dilatation  05/11/2012    Procedure: CYSTOSCOPY WITH URETHRAL DILATATION;  Surgeon: Reece Packer, MD;  Location: [facility];  Service: Urology;;  . Thyroidectomy N/A 02/23/2014    Procedure: TOTAL THYROIDECTOMY;  Surgeon: Armandina Gemma, MD;  Location: [facility];  Service: General;  Laterality: N/A;  . Skin biopsy      vulvar    Family History  Problem Relation Age of Onset  . Hypertension Mother   . Heart disease Mother     pacer, CHF  . Hyperlipidemia Mother   . Kidney disease Mother     1 kidney due to stone  . Kidney Stones Mother   . Hypertension Father   . Cancer Father     lung, psa  . Diabetes Father   . Alcohol abuse Father   . Diabetes Sister   . Cancer Sister   . Hypertension Sister   . Diabetes Brother   . Hypertension Brother   .  Hyperlipidemia Brother   . Kidney disease Brother     dialysis  . Stroke Brother   . Hypertension Maternal Aunt   . Hypertension Maternal Uncle   . Hypertension Paternal Aunt   . Cancer Paternal Aunt   . Hypertension Paternal Uncle   . Hypertension Maternal Grandmother   . Heart disease Maternal Grandmother   . Stroke Maternal Grandmother   . Hypertension Maternal Grandfather   . Hypertension Daughter   . Arthritis Son   . Hyperlipidemia Sister   . Hypertension Sister   . Diabetes Sister   . Thyroid disease Sister   . Cancer Sister     thyroid and breast  . Arthritis Sister     back pain  . Hypertension Sister   . Thyroid disease Sister   . Hypertension Brother   . Hyperlipidemia Brother   . Hypertension Brother   . Hyperlipidemia Brother   . Benign prostatic hyperplasia Brother   . Hypertension  Daughter   . Hyperlipidemia Daughter   . Diabetes Daughter     History   Social History  . Marital Status: Married    Spouse Name: N/A  . Number of Children: 3  . Years of Education: N/A   Occupational History  .      Retired   Social History Main Topics  . Smoking status: Never Smoker   . Smokeless tobacco: Never Used  . Alcohol Use: No  . Drug Use: No  . Sexual Activity: Yes    Birth Control/ Protection: Surgical     Comment: lives with husband, retired from various jobs, diabetic diet   Other Topics Concern  . Not on file   Social History Narrative    Current Outpatient Prescriptions on File Prior to Visit  Medication Sig Dispense Refill  . atorvastatin (LIPITOR) 20 MG tablet Take 1 tablet (20 mg total) by mouth at bedtime. 90 tablet 1  . calcium-vitamin D (OSCAL WITH D) 250-125 MG-UNIT per tablet Take 1 tablet by mouth 2 (two) times daily.     . Ciclopirox 1 % shampoo Apply 1 each topically every 7 (seven) days.     . cycloSPORINE (RESTASIS) 0.05 % ophthalmic emulsion Place 1 drop into both eyes 2 (two) times daily.    Marland Kitchen diltiazem (TIAZAC) 360 MG 24 hr capsule Take 1 capsule (360 mg total) by mouth every morning. 90 capsule 3  . esomeprazole (NEXIUM) 40 MG capsule Take 40 mg by mouth every morning.    . fish oil-omega-3 fatty acids 1000 MG capsule Take 1 g by mouth every evening.     . gabapentin (NEURONTIN) 300 MG capsule Take 300 mg by mouth at bedtime.     . irbesartan (AVAPRO) 75 MG tablet Take 75 mg by mouth at bedtime.    Marland Kitchen levocetirizine (XYZAL) 5 MG tablet Take 5 mg by mouth every evening.    Marland Kitchen levothyroxine (SYNTHROID, LEVOTHROID) 75 MCG tablet Take one tablet Monday through Friday and one half tablet Saturday and Sunday 90 tablet 1  . loteprednol (LOTEMAX) 0.5 % ophthalmic suspension Place 1 drop into the left eye daily as needed ("arthiritis of the eye").     . metFORMIN (GLUCOPHAGE-XR) 750 MG 24 hr tablet TAKE 1 TABLET IN AM AND 1 TABLET AT LUNCH 180  tablet 1  . metoprolol succinate (TOPROL-XL) 25 MG 24 hr tablet Take 25 mg by mouth 2 (two) times daily.     . Multiple Vitamins-Minerals (MULTIVITAMIN WITH MINERALS) tablet Take 1 tablet by  mouth daily with lunch.     Marland Kitchen NASONEX 50 MCG/ACT nasal spray Place 2 sprays into the nose every morning.     . polyvinyl alcohol-povidone (REFRESH) 1.4-0.6 % ophthalmic solution Place 1 drop into both eyes 2 (two) times daily.    Marland Kitchen spironolactone (ALDACTONE) 25 MG tablet TAKE 1 TABLET BY MOUTH TWICE A DAY 180 tablet 1  . vitamin E 1000 UNIT capsule Take 1,000 Units by mouth daily with lunch.      No current facility-administered medications on file prior to visit.    Allergies  Allergen Reactions  . Penicillins Itching  . Sulfa Antibiotics Itching  . Codeine Anxiety    Jittery   . Vicodin [Hydrocodone-Acetaminophen]     ITCHING     Review of Systems  Review of Systems  Constitutional: Negative for fever, chills and malaise/fatigue.  HENT: Negative for congestion, hearing loss and nosebleeds.   Eyes: Negative for discharge.  Respiratory: Negative for cough, sputum production, shortness of breath and wheezing.   Cardiovascular: Negative for chest pain, palpitations and leg swelling.  Gastrointestinal: Negative for heartburn, nausea, vomiting, abdominal pain, diarrhea, constipation and blood in stool.  Genitourinary: Negative for dysuria, urgency, frequency and hematuria.  Musculoskeletal: Positive for joint pain. Negative for myalgias, back pain and falls.       Right knee  Skin: Negative for rash.  Neurological: Negative for dizziness, tremors, sensory change, focal weakness, loss of consciousness, weakness and headaches.  Endo/Heme/Allergies: Negative for polydipsia. Does not bruise/bleed easily.  Psychiatric/Behavioral: Negative for depression and suicidal ideas. The patient is not nervous/anxious and does not have insomnia.     Objective  BP 132/80 mmHg  Pulse 82  Temp(Src) 98.4 F  (36.9 C) (Oral)  Ht 5\' 5"  (1.651 m)  Wt 174 lb 2 oz (78.983 kg)  BMI 28.98 kg/m2  SpO2 98%  Physical Exam  Physical Exam  Constitutional: She is oriented to person, place, and time and well-developed, well-nourished, and in no distress. No distress.  HENT:  Head: Normocephalic and atraumatic.  Right Ear: External ear normal.  Left Ear: External ear normal.  Nose: Nose normal.  Mouth/Throat: Oropharynx is clear and moist. No oropharyngeal exudate.  Eyes: Conjunctivae are normal. Pupils are equal, round, and reactive to light. Right eye exhibits no discharge. Left eye exhibits no discharge. No scleral icterus.  Neck: Normal range of motion. Neck supple. No thyromegaly present.  Cardiovascular: Normal rate, regular rhythm, normal heart sounds and intact distal pulses.   No murmur heard. Pulmonary/Chest: Effort normal and breath sounds normal. No respiratory distress. She has no wheezes. She has no rales.  Abdominal: Soft. Bowel sounds are normal. She exhibits no distension and no mass. There is no tenderness.  Musculoskeletal: Normal range of motion. She exhibits no edema or tenderness.  Lymphadenopathy:    She has no cervical adenopathy.  Neurological: She is alert and oriented to person, place, and time. She has normal reflexes. No cranial nerve deficit. Coordination normal.  Skin: Skin is warm and dry. No rash noted. She is not diaphoretic.  Psychiatric: Mood, memory, affect and judgment normal.    Lab Results  Component Value Date   TSH 0.202* 07/04/2014   Lab Results  Component Value Date   WBC 6.5 02/14/2014   HGB 13.5 02/14/2014   HCT 39.8 02/14/2014   MCV 92.1 02/14/2014   PLT 280 02/14/2014   Lab Results  Component Value Date   CREATININE 0.72 05/02/2014   BUN 11 05/02/2014  NA 141 05/02/2014   K 4.9 05/02/2014   CL 102 05/02/2014   CO2 31 05/02/2014   Lab Results  Component Value Date   ALT 18 05/02/2014   AST 15 05/02/2014   ALKPHOS 61 05/02/2014    BILITOT 0.5 05/02/2014   Lab Results  Component Value Date   CHOL 181 10/11/2013   Lab Results  Component Value Date   HDL 44 10/11/2013   Lab Results  Component Value Date   LDLCALC 86 10/11/2013   Lab Results  Component Value Date   TRIG 255* 10/11/2013   Lab Results  Component Value Date   CHOLHDL 4.1 10/11/2013     Assessment & Plan  Essential hypertension, benign Well controlled, no changes to meds. Encouraged heart healthy diet such as the DASH diet and exercise as tolerated.   GERD (gastroesophageal reflux disease) Avoid offending foods, start probiotics. Do not eat large meals in late evening and consider raising head of bed. May continue Nexium  Hypothyroidism On Levothyroxine, continue to monitor  Diabetic neuropathy with neurologic complication [id number] acceptable, minimize simple carbs. Increase exercise as tolerated. Continue current meds  Diabetic neuropathy Tolerating symptoms on Gabapentin  Right knee DJD Follows with Dr Noemi Chapel has had some relief with steroid injections. Encouraged to consider topical treatments  Hyperlipidemia Encouraged heart healthy diet, increase exercise, avoid trans fats, consider a krill oil cap daily needs to consider statins if does not improve  Breast cancer Prescriptions written for mastectomy bras

## 2014-09-26 NOTE — Progress Notes (Signed)
Pre visit review using our clinic review tool, if applicable. No additional management support is needed unless otherwise documented below in the visit note. 

## 2014-09-26 NOTE — Assessment & Plan Note (Signed)
Well controlled, no changes to meds. Encouraged heart healthy diet such as the DASH diet and exercise as tolerated.  °

## 2014-09-27 ENCOUNTER — Telehealth: Payer: Self-pay | Admitting: *Deleted

## 2014-09-27 ENCOUNTER — Telehealth: Payer: Self-pay

## 2014-09-27 NOTE — Telephone Encounter (Signed)
LMOVM

## 2014-09-27 NOTE — Telephone Encounter (Signed)
-----   Message from Mosie Lukes, MD sent at 09/26/2014 11:17 PM EDT ----- Labs look good thyroid better, sugar better. Cholesterol is up slightly. Have her increase her simvastatin 20 mg by having her take 2 tabs twice a week and 1 tab po qd the rest of the week then we will recheck in 3 months

## 2014-09-27 NOTE — Telephone Encounter (Signed)
Received diabetic shoes order via fax from [place] out as much as possible and forwarded to Dr. Charlett Blake. JG//CMA

## 2014-09-27 NOTE — Telephone Encounter (Signed)
Pt notified of results and medication changes. No questions or concerns at this time.

## 2014-09-28 ENCOUNTER — Other Ambulatory Visit: Payer: Self-pay | Admitting: Family Medicine

## 2014-09-28 NOTE — Telephone Encounter (Signed)
Updated medication list per lab result instructions.

## 2014-10-04 LAB — HM MAMMOGRAPHY

## 2014-10-04 NOTE — Telephone Encounter (Signed)
Completed paperwork along with OV note faxed successfully. Copy sent for scanning.

## 2014-10-06 ENCOUNTER — Encounter: Payer: Self-pay | Admitting: Family Medicine

## 2014-10-08 NOTE — Assessment & Plan Note (Signed)
Follows with Dr Noemi Chapel has had some relief with steroid injections. Encouraged to consider topical treatments

## 2014-10-08 NOTE — Assessment & Plan Note (Signed)
Avoid offending foods, start probiotics. Do not eat large meals in late evening and consider raising head of bed. May continue Nexium

## 2014-10-08 NOTE — Assessment & Plan Note (Signed)
On Levothyroxine, continue to monitor 

## 2014-10-08 NOTE — Assessment & Plan Note (Signed)
hgba1c acceptable, minimize simple carbs. Increase exercise as tolerated. Continue current meds 

## 2014-10-08 NOTE — Assessment & Plan Note (Signed)
Tolerating symptoms on Gabapentin

## 2014-10-08 NOTE — Assessment & Plan Note (Signed)
Prescriptions written for mastectomy bras

## 2014-10-08 NOTE — Assessment & Plan Note (Signed)
Encouraged heart healthy diet, increase exercise, avoid trans fats, consider a krill oil cap daily needs to consider statins if does not improve

## 2014-10-17 ENCOUNTER — Ambulatory Visit (INDEPENDENT_AMBULATORY_CARE_PROVIDER_SITE_OTHER): Payer: PRIVATE HEALTH INSURANCE | Admitting: Podiatry

## 2014-10-17 ENCOUNTER — Encounter: Payer: Self-pay | Admitting: Podiatry

## 2014-10-17 VITALS — BP 126/73 | HR 71 | Ht 65.0 in | Wt 174.0 lb

## 2014-10-17 DIAGNOSIS — E0842 Diabetes mellitus due to underlying condition with diabetic polyneuropathy: Secondary | ICD-10-CM

## 2014-10-17 DIAGNOSIS — M21962 Unspecified acquired deformity of left lower leg: Secondary | ICD-10-CM | POA: Diagnosis not present

## 2014-10-17 DIAGNOSIS — M216X9 Other acquired deformities of unspecified foot: Secondary | ICD-10-CM | POA: Diagnosis not present

## 2014-10-17 DIAGNOSIS — M722 Plantar fascial fibromatosis: Secondary | ICD-10-CM

## 2014-10-17 DIAGNOSIS — E1149 Type 2 diabetes mellitus with other diabetic neurological complication: Secondary | ICD-10-CM

## 2014-10-17 DIAGNOSIS — E1140 Type 2 diabetes mellitus with diabetic neuropathy, unspecified: Secondary | ICD-10-CM

## 2014-10-17 NOTE — Patient Instructions (Signed)
Both feet measured for diabetic shoes. 

## 2014-10-17 NOTE — Progress Notes (Signed)
Subjective: 64 year old female presents requesting diabetic shoes for the left heel pain. She was diagnosed with plantar fasciitis during her last visit in 2014.    Objective:  Chronic left heel pain.  Orthopedic findings reveal elevated first ray left foot. Previous radiograph examination revealed short and elevated first ray left foot.   Assessment: STJ Pronation deformity.  Plantar fasciitis left heel.  Metatarsus primus elevatus left foot.  Plan: Reviewed clinical findings and available treatment options. Both feet measured for diabetic shoes.

## 2014-10-24 ENCOUNTER — Telehealth: Payer: Self-pay | Admitting: Family Medicine

## 2014-10-24 NOTE — Telephone Encounter (Signed)
Caller name: Keishia Ground Relationship to patient: self  Can be reached: [phone]  Reason for call: Pt called about form from Dr. Caffie Pinto, podiatry, that needed filled out for diabetic shoes. She asked that we please fax the form back to Dr. Caffie Pinto to get the shoes. Pt states she dropped the form off in the office last week.

## 2014-10-25 NOTE — Telephone Encounter (Signed)
Forms received on July 1st. Will be completed today and forwarded to Dr. Jacinto Reap. JG//CMA

## 2014-10-30 NOTE — Telephone Encounter (Signed)
Faxed successfully. Sent for scanning. JG//CMA 

## 2014-11-07 ENCOUNTER — Telehealth: Payer: Self-pay | Admitting: Family Medicine

## 2014-11-07 MED ORDER — GLUCOSE BLOOD VI STRP: ORAL_STRIP | Status: DC

## 2014-11-07 NOTE — Telephone Encounter (Signed)
Caller name: Sonyia Relation to pt: Call back number: [phone] Pharmacy: [facility]: [phone] F: [phone]  Reason for call:   Patient is requesting a refill of test strips.

## 2014-11-07 NOTE — Telephone Encounter (Signed)
Called the patient to confirm test strips needed and where to send .  Sent in as she requested to [facility] patient care services mail order.

## 2014-12-14 ENCOUNTER — Other Ambulatory Visit: Payer: Self-pay | Admitting: Family Medicine

## 2014-12-14 MED ORDER — IRBESARTAN 75 MG PO TABS: 75.0000 mg | ORAL_TABLET | Freq: Every day | ORAL | Status: DC

## 2014-12-18 ENCOUNTER — Other Ambulatory Visit: Payer: Self-pay | Admitting: Family Medicine

## 2014-12-19 ENCOUNTER — Other Ambulatory Visit: Payer: Self-pay | Admitting: Family Medicine

## 2014-12-28 ENCOUNTER — Encounter: Payer: Self-pay | Admitting: Family Medicine

## 2014-12-28 ENCOUNTER — Ambulatory Visit (INDEPENDENT_AMBULATORY_CARE_PROVIDER_SITE_OTHER): Payer: Medicare Other | Admitting: Family Medicine

## 2014-12-28 VITALS — BP 108/74 | HR 69 | Temp 97.9°F | Ht 65.0 in | Wt 174.5 lb

## 2014-12-28 DIAGNOSIS — E785 Hyperlipidemia, unspecified: Secondary | ICD-10-CM | POA: Diagnosis not present

## 2014-12-28 DIAGNOSIS — M8580 Other specified disorders of bone density and structure, unspecified site: Secondary | ICD-10-CM | POA: Diagnosis not present

## 2014-12-28 DIAGNOSIS — E1149 Type 2 diabetes mellitus with other diabetic neurological complication: Secondary | ICD-10-CM

## 2014-12-28 DIAGNOSIS — E1140 Type 2 diabetes mellitus with diabetic neuropathy, unspecified: Secondary | ICD-10-CM

## 2014-12-28 DIAGNOSIS — E039 Hypothyroidism, unspecified: Secondary | ICD-10-CM | POA: Diagnosis not present

## 2014-12-28 DIAGNOSIS — Z780 Asymptomatic menopausal state: Secondary | ICD-10-CM

## 2014-12-28 DIAGNOSIS — R109 Unspecified abdominal pain: Secondary | ICD-10-CM

## 2014-12-28 DIAGNOSIS — I10 Essential (primary) hypertension: Secondary | ICD-10-CM

## 2014-12-28 DIAGNOSIS — Z23 Encounter for immunization: Secondary | ICD-10-CM

## 2014-12-28 DIAGNOSIS — K219 Gastro-esophageal reflux disease without esophagitis: Secondary | ICD-10-CM

## 2014-12-28 LAB — COMPREHENSIVE METABOLIC PANEL
ALT: 15 U/L (ref 0–35)
AST: 12 U/L (ref 0–37)
Albumin: 4.2 g/dL (ref 3.5–5.2)
Alkaline Phosphatase: 56 U/L (ref 39–117)
BUN: 18 mg/dL (ref 6–23)
CO2: 33 mEq/L — ABNORMAL HIGH (ref 19–32)
Calcium: 8.9 mg/dL (ref 8.4–10.5)
Chloride: 100 mEq/L (ref 96–112)
Creatinine, Ser: 0.82 mg/dL (ref 0.40–1.20)
GFR: 89.95 mL/min (ref >60.00)
Glucose, Bld: 90 mg/dL (ref 70–99)
Potassium: 4.7 mEq/L (ref 3.5–5.1)
Sodium: 138 mEq/L (ref 135–145)
Total Bilirubin: 0.5 mg/dL (ref 0.2–1.2)
Total Protein: 6.8 g/dL (ref 6.0–8.3)

## 2014-12-28 LAB — TSH: TSH: 2.52 u[IU]/mL (ref 0.35–4.50)

## 2014-12-28 LAB — URINALYSIS
Bilirubin Urine: NEGATIVE
Hgb urine dipstick: NEGATIVE
Ketones, ur: NEGATIVE
Leukocytes, UA: NEGATIVE
Nitrite: NEGATIVE
Specific Gravity, Urine: 1.01 (ref 1.000–1.030)
Total Protein, Urine: NEGATIVE
Urine Glucose: NEGATIVE
Urobilinogen, UA: 0.2 (ref 0.0–1.0)
pH: 7 (ref 5.0–8.0)

## 2014-12-28 LAB — CBC
HCT: 41.5 % (ref 36.0–46.0)
Hemoglobin: 13.6 g/dL (ref 12.0–15.0)
MCHC: 32.7 g/dL (ref 30.0–36.0)
MCV: 97.1 fl (ref 78.0–100.0)
Platelets: 273 10*3/uL (ref 150.0–400.0)
RBC: 4.27 Mil/uL (ref 3.87–5.11)
RDW: 14.4 % (ref 11.5–15.5)
WBC: 8.7 10*3/uL (ref 4.0–10.5)

## 2014-12-28 LAB — VITAMIN D 25 HYDROXY (VIT D DEFICIENCY, FRACTURES): VITD: 34.49 ng/mL (ref 30.00–100.00)

## 2014-12-28 LAB — MICROALBUMIN / CREATININE URINE RATIO
Creatinine,U: 128.4 mg/dL
Microalb Creat Ratio: 0.5 mg/g (ref 0.0–30.0)
Microalb, Ur: <0.7 mg/dL (ref 0.0–1.9)

## 2014-12-28 LAB — HEMOGLOBIN A1C: Hgb A1c MFr Bld: 6.7 % — ABNORMAL HIGH (ref 4.6–6.5)

## 2014-12-28 MED ORDER — GABAPENTIN 300 MG PO CAPS: 300.0000 mg | ORAL_CAPSULE | Freq: Three times a day (TID) | ORAL | Status: DC

## 2014-12-28 MED ORDER — IRBESARTAN 150 MG PO TABS: 75.0000 mg | ORAL_TABLET | Freq: Every day | ORAL | Status: DC

## 2014-12-28 MED ORDER — AVAPRO 150 MG PO TABS: 75.0000 mg | ORAL_TABLET | Freq: Every day | ORAL | Status: DC

## 2014-12-28 NOTE — Progress Notes (Signed)
Subjective:    Patient ID: Victoria Walker, female    DOB: 05/25/1950, 64 y.o.   MRN: [id number]  Chief Complaint  Patient presents with  . Follow-up    HPI Patient is in today for for follow-up. She is complaining of persistent symptoms of peripheral neuropathy with burning and tingling in her feet. No recent illness. Is tolerating medications. No fevers or malaise. Denies CP/palp/SOB/HA/congestion/fevers/GI or GU c/o. Taking meds as prescribed  Past Medical History  Diagnosis Date  . Hypertension   . Neuropathy     FEET  . High cholesterol   . Hypothyroidism   . GERD (gastroesophageal reflux disease)   . Cancer     breast cancer  . Dysrhythmia     HX OF FAST HEART RATE AND PALPITATIONS - METOPROLOL HAS HELPED  . Multinodular thyroid     PT HAVING TROUBLE SWALLOWING  . Diabetes mellitus without complication     type 2 - ORAL MEDICATION - NO INSULIN  . H/O hiatal hernia   . Arthritis     knees, back; PT PLANS RIGHT TOTAL KNEE SURGERY IN THE FUTURE  . Claustrophobia   . H/O iritis     LEFT EYE - STATES HER EYE BECOMES RED AND VERY SENSITIVE TO LIGHT WHEN FLARE UP OF IRITIS  . History of chickenpox 09/26/2014  . History of measles   . History of shingles   . History of MRSA infection     Past Surgical History  Procedure Laterality Date  . Thyroid operation  1976    removed tumor  . Abdominal hysterectomy  1988  . Mastectomy  2007    right  . Plantar fascia surgery Left   . Tummy tuck  1986  . Hernia repair  1986  . Cardiac catheterization  1999  . Anterior and posterior repair  05/11/2012    Procedure: ANTERIOR (CYSTOCELE) AND POSTERIOR REPAIR (RECTOCELE);  Surgeon: Reece Packer, MD;  Location: [facility];  Service: Urology;;  with graft  . Vaginal prolapse repair  05/11/2012    Procedure: VAGINAL VAULT SUSPENSION;  Surgeon: Reece Packer, MD;  Location: [facility];  Service: Urology;;  . Cystoscopy with urethral dilatation  05/11/2012    Procedure: CYSTOSCOPY  WITH URETHRAL DILATATION;  Surgeon: Reece Packer, MD;  Location: [facility];  Service: Urology;;  . Thyroidectomy N/A 02/23/2014    Procedure: TOTAL THYROIDECTOMY;  Surgeon: Armandina Gemma, MD;  Location: [facility];  Service: General;  Laterality: N/A;  . Skin biopsy      vulvar    Family History  Problem Relation Age of Onset  . Hypertension Mother   . Heart disease Mother     pacer, CHF  . Hyperlipidemia Mother   . Kidney disease Mother     1 kidney due to stone  . Kidney Stones Mother   . Hypertension Father   . Cancer Father     lung, psa  . Diabetes Father   . Alcohol abuse Father   . Diabetes Sister   . Cancer Sister   . Hypertension Sister   . Diabetes Brother   . Hypertension Brother   . Hyperlipidemia Brother   . Kidney disease Brother     dialysis  . Stroke Brother   . Hypertension Maternal Aunt   . Hypertension Maternal Uncle   . Hypertension Paternal Aunt   . Cancer Paternal Aunt   . Hypertension Paternal Uncle   . Hypertension Maternal Grandmother   . Heart disease Maternal  Grandmother   . Stroke Maternal Grandmother   . Hypertension Maternal Grandfather   . Hypertension Daughter   . Arthritis Son   . Hyperlipidemia Sister   . Hypertension Sister   . Diabetes Sister   . Thyroid disease Sister   . Cancer Sister     thyroid and breast  . Arthritis Sister     back pain  . Hypertension Sister   . Thyroid disease Sister   . Hypertension Brother   . Hyperlipidemia Brother   . Hypertension Brother   . Hyperlipidemia Brother   . Benign prostatic hyperplasia Brother   . Hypertension Daughter   . Hyperlipidemia Daughter   . Diabetes Daughter     Social History   Social History  . Marital Status: Married    Spouse Name: N/A  . Number of Children: 3  . Years of Education: N/A   Occupational History  .      Retired   Social History Main Topics  . Smoking status: Never Smoker   . Smokeless tobacco: Never Used  . Alcohol Use: No  . Drug Use: No    . Sexual Activity: Yes    Birth Control/ Protection: Surgical     Comment: lives with husband, retired from various jobs, diabetic diet   Other Topics Concern  . Not on file   Social History Narrative    Outpatient Prescriptions Prior to Visit  Medication Sig Dispense Refill  . atorvastatin (LIPITOR) 20 MG tablet Take 1 tablet (20 mg total) by mouth at bedtime. (Patient taking differently: Take 2 by mouth two days a week all other days take one daily) 90 tablet 1  . calcium-vitamin D (OSCAL WITH D) 250-125 MG-UNIT per tablet Take 1 tablet by mouth 2 (two) times daily.     . Ciclopirox 1 % shampoo Apply 1 each topically every 7 (seven) days.     . cycloSPORINE (RESTASIS) 0.05 % ophthalmic emulsion Place 1 drop into both eyes 2 (two) times daily.    Marland Kitchen diltiazem (TIAZAC) 360 MG 24 hr capsule Take 1 capsule (360 mg total) by mouth every morning. 90 capsule 3  . esomeprazole (NEXIUM) 40 MG capsule Take 40 mg by mouth every morning.    . fish oil-omega-3 fatty acids 1000 MG capsule Take 1 g by mouth every evening.     Marland Kitchen glucose blood (ONE TOUCH TEST STRIPS) test strip Use as directed twice daily to check blood sugar. DX code E11.9 300 each 1  . levocetirizine (XYZAL) 5 MG tablet Take 5 mg by mouth every evening.    Marland Kitchen levothyroxine (SYNTHROID, LEVOTHROID) 75 MCG tablet Take one tablet Monday through Friday and one half tablet Saturday and Sunday 90 tablet 1  . loteprednol (LOTEMAX) 0.5 % ophthalmic suspension Place 1 drop into the left eye daily as needed ("arthiritis of the eye").     . metFORMIN (GLUCOPHAGE-XR) 750 MG 24 hr tablet TAKE 1 TABLET IN AM AND 1 TABLET AT LUNCH 180 tablet 1  . metoprolol succinate (TOPROL-XL) 25 MG 24 hr tablet Take 25 mg by mouth 2 (two) times daily.     . metoprolol succinate (TOPROL-XL) 25 MG 24 hr tablet TAKE 1 TABLET BY MOUTH TWICE A DAY AT 12 NOON AND 4PM 180 tablet 1  . Multiple Vitamins-Minerals (MULTIVITAMIN WITH MINERALS) tablet Take 1 tablet by mouth  daily with lunch.     Marland Kitchen NASONEX 50 MCG/ACT nasal spray Place 2 sprays into the nose every morning.     Marland Kitchen  polyvinyl alcohol-povidone (REFRESH) 1.4-0.6 % ophthalmic solution Place 1 drop into both eyes 2 (two) times daily.    Marland Kitchen spironolactone (ALDACTONE) 25 MG tablet TAKE 1 TABLET BY MOUTH TWICE A DAY 180 tablet 1  . vitamin E 1000 UNIT capsule Take 1,000 Units by mouth daily with lunch.     . gabapentin (NEURONTIN) 300 MG capsule Take 300 mg by mouth at bedtime.    . irbesartan (AVAPRO) 75 MG tablet Take 1 tablet (75 mg total) by mouth at bedtime. 30 tablet 1   No facility-administered medications prior to visit.    Allergies  Allergen Reactions  . Penicillins Itching  . Sulfa Antibiotics Itching  . Codeine Anxiety    Jittery   . Vicodin [Hydrocodone-Acetaminophen]     ITCHING     Review of Systems  Constitutional: Negative for fever and malaise/fatigue.  HENT: Negative for congestion.   Eyes: Negative for discharge.  Respiratory: Negative for shortness of breath.   Cardiovascular: Negative for chest pain, palpitations and leg swelling.  Gastrointestinal: Negative for nausea and abdominal pain.  Genitourinary: Negative for dysuria.  Musculoskeletal: Negative for falls.  Skin: Negative for rash.  Neurological: Positive for tingling and sensory change. Negative for loss of consciousness and headaches.  Endo/Heme/Allergies: Negative for environmental allergies.  Psychiatric/Behavioral: Negative for depression. The patient is not nervous/anxious.        Objective:    Physical Exam  Constitutional: She is oriented to person, place, and time. She appears well-developed and well-nourished. No distress.  HENT:  Head: Normocephalic and atraumatic.  Nose: Nose normal.  Eyes: Conjunctivae are normal. Right eye exhibits no discharge. Left eye exhibits no discharge.  Neck: Normal range of motion. Neck supple. No thyromegaly present.  Cardiovascular: Normal rate, regular rhythm and  normal heart sounds.   No murmur heard. Pulmonary/Chest: Effort normal and breath sounds normal. No respiratory distress.  Abdominal: Soft. Bowel sounds are normal. She exhibits no distension and no mass. There is no tenderness.  Musculoskeletal: She exhibits no edema.  Lymphadenopathy:    She has no cervical adenopathy.  Neurological: She is alert and oriented to person, place, and time.  Skin: Skin is warm and dry.  Psychiatric: She has a normal mood and affect. Her behavior is normal.  Nursing note and vitals reviewed.   BP 108/74 mmHg  Pulse 69  Temp(Src) 97.9 F (36.6 C) (Oral)  Ht 5\' 5"  (1.651 m)  Wt 174 lb 8 oz (79.153 kg)  BMI 29.04 kg/m2  SpO2 98% Wt Readings from Last 3 Encounters:  12/28/14 174 lb 8 oz (79.153 kg)  10/17/14 174 lb (78.926 kg)  09/26/14 174 lb 2 oz (78.983 kg)     Lab Results  Component Value Date   WBC 10.6* 09/26/2014   HGB 14.2 09/26/2014   HCT 43.5 09/26/2014   PLT 299.0 09/26/2014   GLUCOSE 87 09/26/2014   CHOL 211* 09/26/2014   TRIG 234.0* 09/26/2014   HDL 57.80 09/26/2014   LDLDIRECT 118.0 09/26/2014   LDLCALC 86 10/11/2013   ALT 18 09/26/2014   AST 14 09/26/2014   NA 137 09/26/2014   K 4.2 09/26/2014   CL 102 09/26/2014   CREATININE 0.82 09/26/2014   BUN 18 09/26/2014   CO2 26 09/26/2014   TSH 1.32 09/26/2014   INR 0.97 05/03/2012   HGBA1C 6.3 09/26/2014   MICROALBUR 0.4 05/02/2014    Lab Results  Component Value Date   TSH 1.32 09/26/2014   Lab Results  Component Value  Date   WBC 10.6* 09/26/2014   HGB 14.2 09/26/2014   HCT 43.5 09/26/2014   MCV 93.6 09/26/2014   PLT 299.0 09/26/2014   Lab Results  Component Value Date   NA 137 09/26/2014   K 4.2 09/26/2014   CO2 26 09/26/2014   GLUCOSE 87 09/26/2014   BUN 18 09/26/2014   CREATININE 0.82 09/26/2014   BILITOT 0.4 09/26/2014   ALKPHOS 72 09/26/2014   AST 14 09/26/2014   ALT 18 09/26/2014   PROT 7.4 09/26/2014   ALBUMIN 4.7 09/26/2014   CALCIUM 10.1  09/26/2014   ANIONGAP 17* 02/24/2014   GFR 90.02 09/26/2014   Lab Results  Component Value Date   CHOL 211* 09/26/2014   Lab Results  Component Value Date   HDL 57.80 09/26/2014   Lab Results  Component Value Date   LDLCALC 86 10/11/2013   Lab Results  Component Value Date   TRIG 234.0* 09/26/2014   Lab Results  Component Value Date   CHOLHDL 4 09/26/2014   Lab Results  Component Value Date   HGBA1C 6.3 09/26/2014       Assessment & Plan:  Hypothyroidism On Levothyroxine, continue to monitor  GERD (gastroesophageal reflux disease) Avoid offending foods, start probiotics. Do not eat large meals in late evening and consider raising head of bed.   Hyperlipidemia Encouraged heart healthy diet, increase exercise, avoid trans fats, consider a krill oil cap daily  Diabetic neuropathy with neurologic complication [id number] acceptable, minimize simple carbs. Increase exercise as tolerated. Continue current meds. Increase gabapentin for neuropathy  Essential hypertension, benign Well controlled, no changes to meds. Encouraged heart healthy diet such as the DASH diet and exercise as tolerated.   given flu and pneumonia shot today Problem List Items Addressed This Visit    Osteopenia   Relevant Medications   gabapentin (NEURONTIN) 300 MG capsule   AVAPRO 150 MG tablet   Other Relevant Orders   DG Bone Density   CBC   TSH   Vit D  25 hydroxy (rtn osteoporosis monitoring)   Comprehensive metabolic panel   Urinalysis   Microalbumin / creatinine urine ratio   Hemoglobin A1c   Hypothyroidism   Relevant Medications   gabapentin (NEURONTIN) 300 MG capsule   AVAPRO 150 MG tablet   Other Relevant Orders   DG Bone Density   CBC   TSH   Vit D  25 hydroxy (rtn osteoporosis monitoring)   Comprehensive metabolic panel   Urinalysis   Microalbumin / creatinine urine ratio   Hemoglobin A1c   Hyperlipidemia   Relevant Medications   gabapentin (NEURONTIN) 300 MG capsule    AVAPRO 150 MG tablet   Other Relevant Orders   DG Bone Density   CBC   TSH   Vit D  25 hydroxy (rtn osteoporosis monitoring)   Comprehensive metabolic panel   Urinalysis   Microalbumin / creatinine urine ratio   Hemoglobin A1c   GERD (gastroesophageal reflux disease)   Relevant Medications   gabapentin (NEURONTIN) 300 MG capsule   AVAPRO 150 MG tablet   Other Relevant Orders   DG Bone Density   CBC   TSH   Vit D  25 hydroxy (rtn osteoporosis monitoring)   Comprehensive metabolic panel   Urinalysis   Microalbumin / creatinine urine ratio   Hemoglobin A1c   Essential hypertension, benign   Relevant Medications   gabapentin (NEURONTIN) 300 MG capsule   AVAPRO 150 MG tablet   Other Relevant Orders   DG  Bone Density   CBC   TSH   Vit D  25 hydroxy (rtn osteoporosis monitoring)   Comprehensive metabolic panel   Urinalysis   Microalbumin / creatinine urine ratio   Hemoglobin A1c    Other Visit Diagnoses    Encounter for immunization    -  Primary    Postmenopausal estrogen deficiency        Relevant Medications    gabapentin (NEURONTIN) 300 MG capsule    AVAPRO 150 MG tablet    Other Relevant Orders    DG Bone Density    CBC    TSH    Vit D  25 hydroxy (rtn osteoporosis monitoring)    Comprehensive metabolic panel    Urinalysis    Microalbumin / creatinine urine ratio    Hemoglobin A1c    Abdominal pain, unspecified abdominal location        Relevant Medications    gabapentin (NEURONTIN) 300 MG capsule    AVAPRO 150 MG tablet    Other Relevant Orders    DG Bone Density    CBC    TSH    Vit D  25 hydroxy (rtn osteoporosis monitoring)    Comprehensive metabolic panel    Urinalysis    Microalbumin / creatinine urine ratio    Hemoglobin A1c       I have discontinued Ms. Storie's gabapentin and irbesartan. I have also changed her irbesartan to AVAPRO. Additionally, I am having her start on gabapentin. Lastly, I am having her maintain her calcium-vitamin D,  multivitamin with minerals, fish oil-omega-3 fatty acids, vitamin E, loteprednol, NASONEX, Ciclopirox, atorvastatin, esomeprazole, metoprolol succinate, cycloSPORINE, polyvinyl alcohol-povidone, levocetirizine, levothyroxine, diltiazem, spironolactone, glucose blood, metoprolol succinate, and metFORMIN.  Meds ordered this encounter  Medications  . DISCONTD: irbesartan (AVAPRO) 150 MG tablet    Sig: Take 0.5-1 tablets (75-150 mg total) by mouth daily.    Dispense:  30 tablet    Refill:  5  . gabapentin (NEURONTIN) 300 MG capsule    Sig: Take 1 capsule (300 mg total) by mouth 3 (three) times daily.    Dispense:  270 capsule    Refill:  1  . AVAPRO 150 MG tablet    Sig: Take 0.5-1 tablets (75-150 mg total) by mouth daily.    Dispense:  30 tablet    Refill:  5     Penni Homans, MD

## 2014-12-28 NOTE — Progress Notes (Signed)
Pre visit review using our clinic review tool, if applicable. No additional management support is needed unless otherwise documented below in the visit note. 

## 2014-12-28 NOTE — Patient Instructions (Signed)

## 2015-01-01 ENCOUNTER — Encounter: Payer: Self-pay | Admitting: Physician Assistant

## 2015-01-01 ENCOUNTER — Ambulatory Visit (INDEPENDENT_AMBULATORY_CARE_PROVIDER_SITE_OTHER): Payer: Medicare Other | Admitting: Physician Assistant

## 2015-01-01 ENCOUNTER — Telehealth: Payer: Self-pay | Admitting: Family Medicine

## 2015-01-01 VITALS — BP 137/84 | HR 85 | Temp 98.2°F | Resp 16 | Ht 65.0 in | Wt 172.4 lb

## 2015-01-01 DIAGNOSIS — L03317 Cellulitis of buttock: Secondary | ICD-10-CM

## 2015-01-01 DIAGNOSIS — T7840XA Allergy, unspecified, initial encounter: Secondary | ICD-10-CM | POA: Insufficient documentation

## 2015-01-01 MED ORDER — DOXYCYCLINE HYCLATE 100 MG PO CAPS: 100.0000 mg | ORAL_CAPSULE | Freq: Two times a day (BID) | ORAL | Status: DC

## 2015-01-01 NOTE — Progress Notes (Signed)
Patient presents to clinic today c/o reaction to Prevnar injection given last Thursday at visit with PCP. Endorses redness, severe tenderness and warmth developing at the site which continued to spread over the next few days. Denies fever, chills or SOB. Has been keeping ice on the area which has helped with tenderness and redness.  Past Medical History  Diagnosis Date  . Hypertension   . Neuropathy     FEET  . High cholesterol   . Hypothyroidism   . GERD (gastroesophageal reflux disease)   . Cancer     breast cancer  . Dysrhythmia     HX OF FAST HEART RATE AND PALPITATIONS - METOPROLOL HAS HELPED  . Multinodular thyroid     PT HAVING TROUBLE SWALLOWING  . Diabetes mellitus without complication     type 2 - ORAL MEDICATION - NO INSULIN  . H/O hiatal hernia   . Arthritis     knees, back; PT PLANS RIGHT TOTAL KNEE SURGERY IN THE FUTURE  . Claustrophobia   . H/O iritis     LEFT EYE - STATES HER EYE BECOMES RED AND VERY SENSITIVE TO LIGHT WHEN FLARE UP OF IRITIS  . History of chickenpox 09/26/2014  . History of measles   . History of shingles   . History of MRSA infection     Current Outpatient Prescriptions on File Prior to Visit  Medication Sig Dispense Refill  . atorvastatin (LIPITOR) 20 MG tablet Take 1 tablet (20 mg total) by mouth at bedtime. (Patient taking differently: Take 2 by mouth two days a week all other days take one daily) 90 tablet 1  . AVAPRO 150 MG tablet Take 0.5-1 tablets (75-150 mg total) by mouth daily. 30 tablet 5  . calcium-vitamin D (OSCAL WITH D) 250-125 MG-UNIT per tablet Take 1 tablet by mouth 2 (two) times daily.     . Ciclopirox 1 % shampoo Apply 1 each topically every 7 (seven) days.     . cycloSPORINE (RESTASIS) 0.05 % ophthalmic emulsion Place 1 drop into both eyes 2 (two) times daily.    Marland Kitchen diltiazem (TIAZAC) 360 MG 24 hr capsule Take 1 capsule (360 mg total) by mouth every morning. 90 capsule 3  . esomeprazole (NEXIUM) 40 MG capsule Take 40 mg  by mouth every morning.    . fish oil-omega-3 fatty acids 1000 MG capsule Take 1 g by mouth every evening.     . gabapentin (NEURONTIN) 300 MG capsule Take 1 capsule (300 mg total) by mouth 3 (three) times daily. 270 capsule 1  . glucose blood (ONE TOUCH TEST STRIPS) test strip Use as directed twice daily to check blood sugar. DX code E11.9 300 each 1  . levocetirizine (XYZAL) 5 MG tablet Take 5 mg by mouth every evening.    Marland Kitchen levothyroxine (SYNTHROID, LEVOTHROID) 75 MCG tablet Take one tablet Monday through Friday and one half tablet Saturday and Sunday 90 tablet 1  . loteprednol (LOTEMAX) 0.5 % ophthalmic suspension Place 1 drop into the left eye daily as needed ("arthiritis of the eye").     . metFORMIN (GLUCOPHAGE-XR) 750 MG 24 hr tablet TAKE 1 TABLET IN AM AND 1 TABLET AT LUNCH 180 tablet 1  . metoprolol succinate (TOPROL-XL) 25 MG 24 hr tablet Take 25 mg by mouth 2 (two) times daily.     . metoprolol succinate (TOPROL-XL) 25 MG 24 hr tablet TAKE 1 TABLET BY MOUTH TWICE A DAY AT 12 NOON AND 4PM 180 tablet 1  .  Multiple Vitamins-Minerals (MULTIVITAMIN WITH MINERALS) tablet Take 1 tablet by mouth daily with lunch.     Marland Kitchen NASONEX 50 MCG/ACT nasal spray Place 2 sprays into the nose daily as needed.     . polyvinyl alcohol-povidone (REFRESH) 1.4-0.6 % ophthalmic solution Place 1 drop into both eyes 2 (two) times daily.    Marland Kitchen spironolactone (ALDACTONE) 25 MG tablet TAKE 1 TABLET BY MOUTH TWICE A DAY 180 tablet 1  . vitamin E 1000 UNIT capsule Take 1,000 Units by mouth daily with lunch.      No current facility-administered medications on file prior to visit.    Allergies  Allergen Reactions  . Penicillins Itching  . Sulfa Antibiotics Itching  . Codeine Anxiety    Jittery   . Vicodin [Hydrocodone-Acetaminophen]     ITCHING     Family History  Problem Relation Age of Onset  . Hypertension Mother   . Heart disease Mother     pacer, CHF  . Hyperlipidemia Mother   . Kidney disease Mother       1 kidney due to stone  . Kidney Stones Mother   . Hypertension Father   . Cancer Father     lung, psa  . Diabetes Father   . Alcohol abuse Father   . Diabetes Sister   . Cancer Sister   . Hypertension Sister   . Diabetes Brother   . Hypertension Brother   . Hyperlipidemia Brother   . Kidney disease Brother     dialysis  . Stroke Brother   . Hypertension Maternal Aunt   . Hypertension Maternal Uncle   . Hypertension Paternal Aunt   . Cancer Paternal Aunt   . Hypertension Paternal Uncle   . Hypertension Maternal Grandmother   . Heart disease Maternal Grandmother   . Stroke Maternal Grandmother   . Hypertension Maternal Grandfather   . Hypertension Daughter   . Arthritis Son   . Hyperlipidemia Sister   . Hypertension Sister   . Diabetes Sister   . Thyroid disease Sister   . Cancer Sister     thyroid and breast  . Arthritis Sister     back pain  . Hypertension Sister   . Thyroid disease Sister   . Hypertension Brother   . Hyperlipidemia Brother   . Hypertension Brother   . Hyperlipidemia Brother   . Benign prostatic hyperplasia Brother   . Hypertension Daughter   . Hyperlipidemia Daughter   . Diabetes Daughter     Social History   Social History  . Marital Status: Married    Spouse Name: N/A  . Number of Children: 3  . Years of Education: N/A   Occupational History  .      Retired   Social History Main Topics  . Smoking status: Never Smoker   . Smokeless tobacco: Never Used  . Alcohol Use: No  . Drug Use: No  . Sexual Activity: Yes    Birth Control/ Protection: Surgical     Comment: lives with husband, retired from various jobs, diabetic diet   Other Topics Concern  . None   Social History Narrative    Review of Systems - See HPI.  All other ROS are negative.  BP 137/84 mmHg  Pulse 85  Temp(Src) 98.2 F (36.8 C) (Oral)  Resp 16  Ht 5\' 5"  (1.651 m)  Wt 172 lb 6 oz (78.189 kg)  BMI 28.68 kg/m2  SpO2 98%  Physical Exam   Constitutional: She is well-developed, well-nourished, and  in no distress.  Cardiovascular: Normal rate, regular rhythm, normal heart sounds and intact distal pulses.   Pulmonary/Chest: Effort normal and breath sounds normal. No respiratory distress. She has no wheezes. She has no rales. She exhibits no tenderness.  Neurological: She is alert.  Skin: Skin is warm and dry.     Vitals reviewed.   Recent Results (from the past 2160 hour(s))  [facility] MAMMOGRAPHY     Status: None   Collection Time: 10/04/14 12:00 AM  Result Value Ref Range   [facility] Mammogram      [place], no evidence of mailgnancy.  evaluate in 1 year.  CBC     Status: None   Collection Time: 12/28/14  8:51 AM  Result Value Ref Range   WBC 8.7 4.0 - 10.5 K/uL   RBC 4.27 3.87 - 5.11 Mil/uL   Platelets 273.0 150.0 - 400.0 K/uL   Hemoglobin 13.6 12.0 - 15.0 g/dL   HCT 41.5 36.0 - 46.0 %   MCV 97.1 78.0 - 100.0 fl   MCHC 32.7 30.0 - 36.0 g/dL   RDW 14.4 11.5 - 15.5 %  TSH     Status: None   Collection Time: 12/28/14  8:51 AM  Result Value Ref Range   TSH 2.52 0.35 - 4.50 uIU/mL  Vit D  25 hydroxy (rtn osteoporosis monitoring)     Status: None   Collection Time: 12/28/14  8:51 AM  Result Value Ref Range   VITD 34.49 30.00 - 100.00 ng/mL  Comprehensive metabolic panel     Status: Abnormal   Collection Time: 12/28/14  8:51 AM  Result Value Ref Range   Sodium 138 135 - 145 mEq/L   Potassium 4.7 3.5 - 5.1 mEq/L   Chloride 100 96 - 112 mEq/L   CO2 33 (H) 19 - 32 mEq/L   Glucose, Bld 90 70 - 99 mg/dL   BUN 18 6 - 23 mg/dL   Creatinine, Ser 0.82 0.40 - 1.20 mg/dL   Total Bilirubin 0.5 0.2 - 1.2 mg/dL   Alkaline Phosphatase 56 39 - 117 U/L   AST 12 0 - 37 U/L   ALT 15 0 - 35 U/L   Total Protein 6.8 6.0 - 8.3 g/dL   Albumin 4.2 3.5 - 5.2 g/dL   Calcium 8.9 8.4 - 10.5 mg/dL   GFR 89.95 >60.00 mL/min  Urinalysis     Status: None   Collection Time: 12/28/14  8:51 AM  Result Value Ref Range   Color, Urine YELLOW Yellow;Lt.  Yellow   APPearance CLEAR Clear   Specific Gravity, Urine 1.010 1.000-1.030   pH 7.0 5.0 - 8.0   Total Protein, Urine NEGATIVE Negative   Urine Glucose NEGATIVE Negative   Ketones, ur NEGATIVE Negative   Bilirubin Urine NEGATIVE Negative   Hgb urine dipstick NEGATIVE Negative   Urobilinogen, UA 0.2 0.0 - 1.0   Leukocytes, UA NEGATIVE Negative   Nitrite NEGATIVE Negative  Microalbumin / creatinine urine ratio     Status: None   Collection Time: 12/28/14  8:51 AM  Result Value Ref Range   Microalb, Ur <0.7 0.0 - 1.9 mg/dL   Creatinine,U 128.4 mg/dL   Microalb Creat Ratio 0.5 0.0 - 30.0 mg/g  Hemoglobin A1c     Status: Abnormal   Collection Time: 12/28/14  8:51 AM  Result Value Ref Range   Hgb A1c MFr Bld 6.7 (H) 4.6 - 6.5 %    Comment: Glycemic Control Guidelines for People with Diabetes:Non Diabetic:  <6%Goal  of Therapy: <7%Additional Action Suggested:  >8%     Assessment/Plan: Allergic response Mild. Benadryl at bedtime. Continue daily Xyzal and cool compresses. Will resolve over next week, especially once concurrent cellulitis is treated. See separate A/P.  Cellulitis of buttock Rx Doxycycline BID. Supportive measures reviewed. Follow-up 1 week. Return immediately if anything worsens.

## 2015-01-01 NOTE — Patient Instructions (Signed)
Please take the antibiotic as directed. Continue your Xyzal (taking in the morning) and take a Benadryl at night for itch and inflammation.  Continue your ice packs.  Symptoms should resolve over the next 4-5 days. Follow-up in 1 week if all symptoms are not resolved. Return immediately if anything worsens.

## 2015-01-01 NOTE — Assessment & Plan Note (Signed)
Mild. Benadryl at bedtime. Continue daily Xyzal and cool compresses. Will resolve over next week, especially once concurrent cellulitis is treated. See separate A/P.

## 2015-01-01 NOTE — Assessment & Plan Note (Signed)
Rx Doxycycline BID. Supportive measures reviewed. Follow-up 1 week. Return immediately if anything worsens.

## 2015-01-01 NOTE — Telephone Encounter (Signed)
error:[id number] ° °

## 2015-01-02 ENCOUNTER — Ambulatory Visit (HOSPITAL_BASED_OUTPATIENT_CLINIC_OR_DEPARTMENT_OTHER)
Admission: RE | Admit: 2015-01-02 | Discharge: 2015-01-02 | Disposition: A | Discharge status: Home / Self Care | Payer: Medicare Other | Source: Ambulatory Visit | Attending: Family Medicine | Admitting: Family Medicine

## 2015-01-02 DIAGNOSIS — R109 Unspecified abdominal pain: Secondary | ICD-10-CM

## 2015-01-02 DIAGNOSIS — Z780 Asymptomatic menopausal state: Secondary | ICD-10-CM

## 2015-01-02 DIAGNOSIS — E039 Hypothyroidism, unspecified: Secondary | ICD-10-CM

## 2015-01-02 DIAGNOSIS — E785 Hyperlipidemia, unspecified: Secondary | ICD-10-CM

## 2015-01-02 DIAGNOSIS — M8580 Other specified disorders of bone density and structure, unspecified site: Secondary | ICD-10-CM

## 2015-01-02 DIAGNOSIS — I10 Essential (primary) hypertension: Secondary | ICD-10-CM

## 2015-01-02 DIAGNOSIS — K219 Gastro-esophageal reflux disease without esophagitis: Secondary | ICD-10-CM

## 2015-01-03 ENCOUNTER — Telehealth: Payer: Self-pay | Admitting: Family Medicine

## 2015-01-03 MED ORDER — FLUCONAZOLE 150 MG PO TABS: 150.0000 mg | ORAL_TABLET | Freq: Once | ORAL | Status: DC

## 2015-01-03 NOTE — Telephone Encounter (Signed)
Ph[facility]: [facility] [address]  In [facility]  Reason for call: Pt is getting yeast infection from antibiotic. She said diflucan was supposed to be sent for her (a couple of them). Please send in or notify pt.

## 2015-01-03 NOTE — Telephone Encounter (Signed)
[facility]. She is not to take Lipitor while on the Diflucan.

## 2015-01-04 NOTE — Telephone Encounter (Signed)
Not[facility] and not to take [facility]itor. She would like nurse to clarify if she needs to not take the same day or not take for a certain time period.

## 2015-01-04 NOTE — Telephone Encounter (Signed)
Same day

## 2015-01-05 NOTE — Telephone Encounter (Signed)
Left msg to notify pt °

## 2015-01-08 ENCOUNTER — Encounter: Payer: Self-pay | Admitting: Physician Assistant

## 2015-01-08 ENCOUNTER — Ambulatory Visit (INDEPENDENT_AMBULATORY_CARE_PROVIDER_SITE_OTHER): Payer: Medicare Other | Admitting: Physician Assistant

## 2015-01-08 VITALS — BP 113/71 | HR 57 | Temp 97.9°F | Resp 16 | Ht 65.0 in | Wt 172.5 lb

## 2015-01-08 DIAGNOSIS — L03317 Cellulitis of buttock: Secondary | ICD-10-CM

## 2015-01-08 NOTE — Patient Instructions (Signed)
Please keep the area clean and dry. Lotion will help with any itch Follow-up if symptoms recur.

## 2015-01-08 NOTE — Progress Notes (Signed)
Pre visit review using our clinic review tool, if applicable. No additional management support is needed unless otherwise documented below in the visit note/SLS  

## 2015-01-08 NOTE — Assessment & Plan Note (Signed)
Resolved. Exam within normal limits. Discussed supportive measures. Follow-up if symptoms recur.

## 2015-01-08 NOTE — Progress Notes (Signed)
Patient presents to clinic today for follow-up of cellulitis of buttock. Has taken all of her antibiotic. Endorses redness, tenderness, warmth and itching have all resolved. Denies fever, chills. Denies new symptoms.  Past Medical History  Diagnosis Date  . Hypertension   . Neuropathy     FEET  . High cholesterol   . Hypothyroidism   . GERD (gastroesophageal reflux disease)   . Cancer     breast cancer  . Dysrhythmia     HX OF FAST HEART RATE AND PALPITATIONS - METOPROLOL HAS HELPED  . Multinodular thyroid     PT HAVING TROUBLE SWALLOWING  . Diabetes mellitus without complication     type 2 - ORAL MEDICATION - NO INSULIN  . H/O hiatal hernia   . Arthritis     knees, back; PT PLANS RIGHT TOTAL KNEE SURGERY IN THE FUTURE  . Claustrophobia   . H/O iritis     LEFT EYE - STATES HER EYE BECOMES RED AND VERY SENSITIVE TO LIGHT WHEN FLARE UP OF IRITIS  . History of chickenpox 09/26/2014  . History of measles   . History of shingles   . History of MRSA infection     Current Outpatient Prescriptions on File Prior to Visit  Medication Sig Dispense Refill  . atorvastatin (LIPITOR) 20 MG tablet Take 1 tablet (20 mg total) by mouth at bedtime. (Patient taking differently: Take 2 by mouth two days a week all other days take one daily) 90 tablet 1  . AVAPRO 150 MG tablet Take 0.5-1 tablets (75-150 mg total) by mouth daily. 30 tablet 5  . calcium-vitamin D (OSCAL WITH D) 250-125 MG-UNIT per tablet Take 1 tablet by mouth 2 (two) times daily.     . Ciclopirox 1 % shampoo Apply 1 each topically every 7 (seven) days.     . cycloSPORINE (RESTASIS) 0.05 % ophthalmic emulsion Place 1 drop into both eyes 2 (two) times daily.    Marland Kitchen diltiazem (TIAZAC) 360 MG 24 hr capsule Take 1 capsule (360 mg total) by mouth every morning. 90 capsule 3  . esomeprazole (NEXIUM) 40 MG capsule Take 40 mg by mouth every morning.    . fish oil-omega-3 fatty acids 1000 MG capsule Take 1 g by mouth every evening.     .  gabapentin (NEURONTIN) 300 MG capsule Take 1 capsule (300 mg total) by mouth 3 (three) times daily. 270 capsule 1  . glucose blood (ONE TOUCH TEST STRIPS) test strip Use as directed twice daily to check blood sugar. DX code E11.9 300 each 1  . levocetirizine (XYZAL) 5 MG tablet Take 5 mg by mouth every evening.    Marland Kitchen levothyroxine (SYNTHROID, LEVOTHROID) 75 MCG tablet Take one tablet Monday through Friday and one half tablet Saturday and Sunday 90 tablet 1  . loteprednol (LOTEMAX) 0.5 % ophthalmic suspension Place 1 drop into the left eye daily as needed ("arthiritis of the eye").     . metFORMIN (GLUCOPHAGE-XR) 750 MG 24 hr tablet TAKE 1 TABLET IN AM AND 1 TABLET AT LUNCH 180 tablet 1  . metoprolol succinate (TOPROL-XL) 25 MG 24 hr tablet Take 25 mg by mouth 2 (two) times daily.     . Multiple Vitamins-Minerals (MULTIVITAMIN WITH MINERALS) tablet Take 1 tablet by mouth daily with lunch.     Marland Kitchen NASONEX 50 MCG/ACT nasal spray Place 2 sprays into the nose daily as needed.     . polyvinyl alcohol-povidone (REFRESH) 1.4-0.6 % ophthalmic solution Place 1 drop  into both eyes 2 (two) times daily.    Marland Kitchen spironolactone (ALDACTONE) 25 MG tablet TAKE 1 TABLET BY MOUTH TWICE A DAY 180 tablet 1  . vitamin E 1000 UNIT capsule Take 1,000 Units by mouth daily with lunch.      No current facility-administered medications on file prior to visit.    Allergies  Allergen Reactions  . Penicillins Itching  . Sulfa Antibiotics Itching  . Codeine Anxiety    Jittery   . Vicodin [Hydrocodone-Acetaminophen]     ITCHING     Family History  Problem Relation Age of Onset  . Hypertension Mother   . Heart disease Mother     pacer, CHF  . Hyperlipidemia Mother   . Kidney disease Mother     1 kidney due to stone  . Kidney Stones Mother   . Hypertension Father   . Cancer Father     lung, psa  . Diabetes Father   . Alcohol abuse Father   . Diabetes Sister   . Cancer Sister   . Hypertension Sister   . Diabetes  Brother   . Hypertension Brother   . Hyperlipidemia Brother   . Kidney disease Brother     dialysis  . Stroke Brother   . Hypertension Maternal Aunt   . Hypertension Maternal Uncle   . Hypertension Paternal Aunt   . Cancer Paternal Aunt   . Hypertension Paternal Uncle   . Hypertension Maternal Grandmother   . Heart disease Maternal Grandmother   . Stroke Maternal Grandmother   . Hypertension Maternal Grandfather   . Hypertension Daughter   . Arthritis Son   . Hyperlipidemia Sister   . Hypertension Sister   . Diabetes Sister   . Thyroid disease Sister   . Cancer Sister     thyroid and breast  . Arthritis Sister     back pain  . Hypertension Sister   . Thyroid disease Sister   . Hypertension Brother   . Hyperlipidemia Brother   . Hypertension Brother   . Hyperlipidemia Brother   . Benign prostatic hyperplasia Brother   . Hypertension Daughter   . Hyperlipidemia Daughter   . Diabetes Daughter     Social History   Social History  . Marital Status: Married    Spouse Name: N/A  . Number of Children: 3  . Years of Education: N/A   Occupational History  .      Retired   Social History Main Topics  . Smoking status: Never Smoker   . Smokeless tobacco: Never Used  . Alcohol Use: No  . Drug Use: No  . Sexual Activity: Yes    Birth Control/ Protection: Surgical     Comment: lives with husband, retired from various jobs, diabetic diet   Other Topics Concern  . None   Social History Narrative   Review of Systems - See HPI.  All other ROS are negative.  BP 113/71 mmHg  Pulse 57  Temp(Src) 97.9 F (36.6 C) (Oral)  Resp 16  Ht 5\' 5"  (1.651 m)  Wt 172 lb 8 oz (78.245 kg)  BMI 28.71 kg/m2  SpO2 100%  Physical Exam  Constitutional: She is oriented to person, place, and time and well-developed, well-nourished, and in no distress.  HENT:  Head: Normocephalic and atraumatic.  Eyes: Conjunctivae are normal.  Cardiovascular: Normal rate, regular rhythm, normal  heart sounds and intact distal pulses.   Pulmonary/Chest: Effort normal and breath sounds normal. No respiratory distress. She has no  wheezes. She has no rales. She exhibits no tenderness.  Neurological: She is alert and oriented to person, place, and time.  Skin: Skin is warm and dry. No rash noted.  Psychiatric: Affect normal.  Vitals reviewed.   Recent Results (from the past 2160 hour(s))  CBC     Status: None   Collection Time: 12/28/14  8:51 AM  Result Value Ref Range   WBC 8.7 4.0 - 10.5 K/uL   RBC 4.27 3.87 - 5.11 Mil/uL   Platelets 273.0 150.0 - 400.0 K/uL   Hemoglobin 13.6 12.0 - 15.0 g/dL   HCT 41.5 36.0 - 46.0 %   MCV 97.1 78.0 - 100.0 fl   MCHC 32.7 30.0 - 36.0 g/dL   RDW 14.4 11.5 - 15.5 %  TSH     Status: None   Collection Time: 12/28/14  8:51 AM  Result Value Ref Range   TSH 2.52 0.35 - 4.50 uIU/mL  Vit D  25 hydroxy (rtn osteoporosis monitoring)     Status: None   Collection Time: 12/28/14  8:51 AM  Result Value Ref Range   VITD 34.49 30.00 - 100.00 ng/mL  Comprehensive metabolic panel     Status: Abnormal   Collection Time: 12/28/14  8:51 AM  Result Value Ref Range   Sodium 138 135 - 145 mEq/L   Potassium 4.7 3.5 - 5.1 mEq/L   Chloride 100 96 - 112 mEq/L   CO2 33 (H) 19 - 32 mEq/L   Glucose, Bld 90 70 - 99 mg/dL   BUN 18 6 - 23 mg/dL   Creatinine, Ser 0.82 0.40 - 1.20 mg/dL   Total Bilirubin 0.5 0.2 - 1.2 mg/dL   Alkaline Phosphatase 56 39 - 117 U/L   AST 12 0 - 37 U/L   ALT 15 0 - 35 U/L   Total Protein 6.8 6.0 - 8.3 g/dL   Albumin 4.2 3.5 - 5.2 g/dL   Calcium 8.9 8.4 - 10.5 mg/dL   GFR 89.95 >60.00 mL/min  Urinalysis     Status: None   Collection Time: 12/28/14  8:51 AM  Result Value Ref Range   Color, Urine YELLOW Yellow;Lt. Yellow   APPearance CLEAR Clear   Specific Gravity, Urine 1.010 1.000-1.030   pH 7.0 5.0 - 8.0   Total Protein, Urine NEGATIVE Negative   Urine Glucose NEGATIVE Negative   Ketones, ur NEGATIVE Negative   Bilirubin Urine  NEGATIVE Negative   Hgb urine dipstick NEGATIVE Negative   Urobilinogen, UA 0.2 0.0 - 1.0   Leukocytes, UA NEGATIVE Negative   Nitrite NEGATIVE Negative  Microalbumin / creatinine urine ratio     Status: None   Collection Time: 12/28/14  8:51 AM  Result Value Ref Range   Microalb, Ur <0.7 0.0 - 1.9 mg/dL   Creatinine,U 128.4 mg/dL   Microalb Creat Ratio 0.5 0.0 - 30.0 mg/g  Hemoglobin A1c     Status: Abnormal   Collection Time: 12/28/14  8:51 AM  Result Value Ref Range   Hgb A1c MFr Bld 6.7 (H) 4.6 - 6.5 %    Comment: Glycemic Control Guidelines for People with Diabetes:Non Diabetic:  <6%Goal of Therapy: <7%Additional Action Suggested:  >8%     Assessment/Plan: Cellulitis of buttock Resolved. Exam within normal limits. Discussed supportive measures. Follow-up if symptoms recur.

## 2015-01-13 ENCOUNTER — Encounter: Payer: Self-pay | Admitting: Family Medicine

## 2015-01-13 NOTE — Assessment & Plan Note (Signed)
Well controlled, no changes to meds. Encouraged heart healthy diet such as the DASH diet and exercise as tolerated.  °

## 2015-01-13 NOTE — Assessment & Plan Note (Addendum)
hgba1c acceptable, minimize simple carbs. Increase exercise as tolerated. Continue current meds. Increase gabapentin for neuropathy. Given flu shot and pneumonia shot today

## 2015-01-13 NOTE — Assessment & Plan Note (Signed)
Encouraged heart healthy diet, increase exercise, avoid trans fats, consider a krill oil cap daily 

## 2015-01-13 NOTE — Assessment & Plan Note (Signed)
Avoid offending foods, start probiotics. Do not eat large meals in late evening and consider raising head of bed.  

## 2015-01-13 NOTE — Assessment & Plan Note (Signed)
On Levothyroxine, continue to monitor 

## 2015-01-17 ENCOUNTER — Ambulatory Visit (HOSPITAL_BASED_OUTPATIENT_CLINIC_OR_DEPARTMENT_OTHER)
Admission: RE | Admit: 2015-01-17 | Discharge: 2015-01-17 | Disposition: A | Discharge status: Home / Self Care | Payer: Medicare Other | Source: Ambulatory Visit | Attending: Family Medicine | Admitting: Family Medicine

## 2015-01-17 DIAGNOSIS — E785 Hyperlipidemia, unspecified: Secondary | ICD-10-CM | POA: Insufficient documentation

## 2015-01-17 DIAGNOSIS — Z780 Asymptomatic menopausal state: Secondary | ICD-10-CM | POA: Diagnosis present

## 2015-01-17 DIAGNOSIS — M8580 Other specified disorders of bone density and structure, unspecified site: Secondary | ICD-10-CM | POA: Diagnosis not present

## 2015-01-17 DIAGNOSIS — I10 Essential (primary) hypertension: Secondary | ICD-10-CM | POA: Insufficient documentation

## 2015-01-17 DIAGNOSIS — K219 Gastro-esophageal reflux disease without esophagitis: Secondary | ICD-10-CM | POA: Insufficient documentation

## 2015-01-17 DIAGNOSIS — E039 Hypothyroidism, unspecified: Secondary | ICD-10-CM | POA: Diagnosis not present

## 2015-01-17 LAB — HM DEXA SCAN

## 2015-01-23 ENCOUNTER — Telehealth: Payer: Self-pay | Admitting: Family Medicine

## 2015-01-23 ENCOUNTER — Encounter: Payer: Self-pay | Admitting: Family Medicine

## 2015-01-23 NOTE — Telephone Encounter (Signed)
Called the patient informed of Bone Density results per PCP instructions.  Done on 01/17/15 Osteopenia stable Will check Vitamin D at next blood draw. Take daily calcium 1200 - 1500 mg daily Take Vitamin D 2000 IU's daily. The patient was contacted and informed of results/instructions per PCP. Also abstracted results into chart as well.

## 2015-02-22 ENCOUNTER — Ambulatory Visit (INDEPENDENT_AMBULATORY_CARE_PROVIDER_SITE_OTHER): Payer: Medicare Other

## 2015-02-22 VITALS — BP 122/70 | HR 81 | Ht 66.0 in | Wt 175.6 lb

## 2015-02-22 DIAGNOSIS — Z114 Encounter for screening for human immunodeficiency virus [HIV]: Secondary | ICD-10-CM | POA: Diagnosis not present

## 2015-02-22 DIAGNOSIS — Z0000 Encounter for general adult medical examination without abnormal findings: Secondary | ICD-10-CM | POA: Diagnosis not present

## 2015-02-22 DIAGNOSIS — Z1159 Encounter for screening for other viral diseases: Secondary | ICD-10-CM

## 2015-02-22 NOTE — Patient Instructions (Addendum)
Go to lab for blood work.    Eat a healthy diet and exercise as tolerated.    Have advanced directive signed and notarized.  Bring back a copy at your next visit.  Follow up with Dr. Charlett Blake as scheduled.    Schedule Medicare Wellness for next year.     Health Maintenance, Female Adopting a healthy lifestyle and getting preventive care can go a long way to promote health and wellness. Talk with your health care provider about what schedule of regular examinations is right for you. This is a good chance for you to check in with your provider about disease prevention and staying healthy. In between checkups, there are plenty of things you can do on your own. Experts have done a lot of research about which lifestyle changes and preventive measures are most likely to keep you healthy. Ask your health care provider for more information. WEIGHT AND DIET  Eat a healthy diet  Be sure to include plenty of vegetables, fruits, low-fat dairy products, and lean protein.  Do not eat a lot of foods high in solid fats, added sugars, or salt.  Get regular exercise. This is one of the most important things you can do for your health.  Most adults should exercise for at least 150 minutes each week. The exercise should increase your heart rate and make you sweat (moderate-intensity exercise).  Most adults should also do strengthening exercises at least twice a week. This is in addition to the moderate-intensity exercise.  Maintain a healthy weight  Body mass index (BMI) is a measurement that can be used to identify possible weight problems. It estimates body fat based on height and weight. Your health care provider can help determine your BMI and help you achieve or maintain a healthy weight.  For females 31 years of age and older:   A BMI below 18.5 is considered underweight.  A BMI of 18.5 to 24.9 is normal.  A BMI of 25 to 29.9 is considered overweight.  A BMI of 30 and above is considered  obese.  Watch levels of cholesterol and blood lipids  You should start having your blood tested for lipids and cholesterol at 65 years of age, then have this test every 5 years.  You may need to have your cholesterol levels checked more often if:  Your lipid or cholesterol levels are high.  You are older than 65 years of age.  You are at high risk for heart disease.  CANCER SCREENING   Lung Cancer  Lung cancer screening is recommended for adults 56-78 years old who are at high risk for lung cancer because of a history of smoking.  A yearly low-dose CT scan of the lungs is recommended for people who:  Currently smoke.  Have quit within the past 15 years.  Have at least a 30-pack-year history of smoking. A pack year is smoking an average of one pack of cigarettes a day for 1 year.  Yearly screening should continue until it has been 15 years since you quit.  Yearly screening should stop if you develop a health problem that would prevent you from having lung cancer treatment.  Breast Cancer  Practice breast self-awareness. This means understanding how your breasts normally appear and feel.  It also means doing regular breast self-exams. Let your health care provider know about any changes, no matter how small.  If you are in your 20s or 30s, you should have a clinical breast exam (CBE) by a  health care provider every 1-3 years as part of a regular health exam.  If you are 40 or older, have a CBE every year. Also consider having a breast X-ray (mammogram) every year.  If you have a family history of breast cancer, talk to your health care provider about genetic screening.  If you are at high risk for breast cancer, talk to your health care provider about having an MRI and a mammogram every year.  Breast cancer gene (BRCA) assessment is recommended for women who have family members with BRCA-related cancers. BRCA-related cancers  include:  Breast.  Ovarian.  Tubal.  Peritoneal cancers.  Results of the assessment will determine the need for genetic counseling and BRCA1 and BRCA2 testing. Cervical Cancer Your health care provider may recommend that you be screened regularly for cancer of the pelvic organs (ovaries, uterus, and vagina). This screening involves a pelvic examination, including checking for microscopic changes to the surface of your cervix (Pap test). You may be encouraged to have this screening done every 3 years, beginning at age 21.  For women ages 30-65, health care providers may recommend pelvic exams and Pap testing every 3 years, or they may recommend the Pap and pelvic exam, combined with testing for human papilloma virus (HPV), every 5 years. Some types of HPV increase your risk of cervical cancer. Testing for HPV may also be done on women of any age with unclear Pap test results.  Other health care providers may not recommend any screening for nonpregnant women who are considered low risk for pelvic cancer and who do not have symptoms. Ask your health care provider if a screening pelvic exam is right for you.  If you have had past treatment for cervical cancer or a condition that could lead to cancer, you need Pap tests and screening for cancer for at least 20 years after your treatment. If Pap tests have been discontinued, your risk factors (such as having a new sexual partner) need to be reassessed to determine if screening should resume. Some women have medical problems that increase the chance of getting cervical cancer. In these cases, your health care provider may recommend more frequent screening and Pap tests. Colorectal Cancer  This type of cancer can be detected and often prevented.  Routine colorectal cancer screening usually begins at 65 years of age and continues through 65 years of age.  Your health care provider may recommend screening at an earlier age if you have risk factors for  colon cancer.  Your health care provider may also recommend using home test kits to check for hidden blood in the stool.  A small camera at the end of a tube can be used to examine your colon directly (sigmoidoscopy or colonoscopy). This is done to check for the earliest forms of colorectal cancer.  Routine screening usually begins at age 50.  Direct examination of the colon should be repeated every 5-10 years through 65 years of age. However, you may need to be screened more often if early forms of precancerous polyps or small growths are found. Skin Cancer  Check your skin from head to toe regularly.  Tell your health care provider about any new moles or changes in moles, especially if there is a change in a mole's shape or color.  Also tell your health care provider if you have a mole that is larger than the size of a pencil eraser.  Always use sunscreen. Apply sunscreen liberally and repeatedly throughout the day.    Protect yourself by wearing long sleeves, pants, a wide-brimmed hat, and sunglasses whenever you are outside. HEART DISEASE, DIABETES, AND HIGH BLOOD PRESSURE   High blood pressure causes heart disease and increases the risk of stroke. High blood pressure is more likely to develop in:  People who have blood pressure in the high end of the normal range (130-139/85-89 mm Hg).  People who are overweight or obese.  People who are African American.  If you are 18-39 years of age, have your blood pressure checked every 3-5 years. If you are 40 years of age or older, have your blood pressure checked every year. You should have your blood pressure measured twice--once when you are at a hospital or clinic, and once when you are not at a hospital or clinic. Record the average of the two measurements. To check your blood pressure when you are not at a hospital or clinic, you can use:  An automated blood pressure machine at a pharmacy.  A home blood pressure monitor.  If you  are between 55 years and 79 years old, ask your health care provider if you should take aspirin to prevent strokes.  Have regular diabetes screenings. This involves taking a blood sample to check your fasting blood sugar level.  If you are at a normal weight and have a low risk for diabetes, have this test once every three years after 65 years of age.  If you are overweight and have a high risk for diabetes, consider being tested at a younger age or more often. PREVENTING INFECTION  Hepatitis B  If you have a higher risk for hepatitis B, you should be screened for this virus. You are considered at high risk for hepatitis B if:  You were born in a country where hepatitis B is common. Ask your health care provider which countries are considered high risk.  Your parents were born in a high-risk country, and you have not been immunized against hepatitis B (hepatitis B vaccine).  You have HIV or AIDS.  You use needles to inject street drugs.  You live with someone who has hepatitis B.  You have had sex with someone who has hepatitis B.  You get hemodialysis treatment.  You take certain medicines for conditions, including cancer, organ transplantation, and autoimmune conditions. Hepatitis C  Blood testing is recommended for:  Everyone born from 1945 through 1965.  Anyone with known risk factors for hepatitis C. Sexually transmitted infections (STIs)  You should be screened for sexually transmitted infections (STIs) including gonorrhea and chlamydia if:  You are sexually active and are younger than 65 years of age.  You are older than 65 years of age and your health care provider tells you that you are at risk for this type of infection.  Your sexual activity has changed since you were last screened and you are at an increased risk for chlamydia or gonorrhea. Ask your health care provider if you are at risk.  If you do not have HIV, but are at risk, it may be recommended that you  take a prescription medicine daily to prevent HIV infection. This is called pre-exposure prophylaxis (PrEP). You are considered at risk if:  You are sexually active and do not regularly use condoms or know the HIV status of your partner(s).  You take drugs by injection.  You are sexually active with a partner who has HIV. Talk with your health care provider about whether you are at high risk of being infected   with HIV. If you choose to begin PrEP, you should first be tested for HIV. You should then be tested every 3 months for as long as you are taking PrEP.  PREGNANCY   If you are premenopausal and you may become pregnant, ask your health care provider about preconception counseling.  If you may become pregnant, take 400 to 800 micrograms (mcg) of folic acid every day.  If you want to prevent pregnancy, talk to your health care provider about birth control (contraception). OSTEOPOROSIS AND MENOPAUSE   Osteoporosis is a disease in which the bones lose minerals and strength with aging. This can result in serious bone fractures. Your risk for osteoporosis can be identified using a bone density scan.  If you are 28 years of age or older, or if you are at risk for osteoporosis and fractures, ask your health care provider if you should be screened.  Ask your health care provider whether you should take a calcium or vitamin D supplement to lower your risk for osteoporosis.  Menopause may have certain physical symptoms and risks.  Hormone replacement therapy may reduce some of these symptoms and risks. Talk to your health care provider about whether hormone replacement therapy is right for you.  HOME CARE INSTRUCTIONS   Schedule regular health, dental, and eye exams.  Stay current with your immunizations.   Do not use any tobacco products including cigarettes, chewing tobacco, or electronic cigarettes.  If you are pregnant, do not drink alcohol.  If you are breastfeeding, limit how  much and how often you drink alcohol.  Limit alcohol intake to no more than 1 drink per day for nonpregnant women. One drink equals 12 ounces of beer, 5 ounces of wine, or 1 ounces of hard liquor.  Do not use street drugs.  Do not share needles.  Ask your health care provider for help if you need support or information about quitting drugs.  Tell your health care provider if you often feel depressed.  Tell your health care provider if you have ever been abused or do not feel safe at home.   This information is not intended to replace advice given to you by your health care provider. Make sure you discuss any questions you have with your health care provider.   Document Released: 10/21/2010 Document Revised: 04/28/2014 Document Reviewed: 03/09/2013 Elsevier Interactive Patient Education [employer].

## 2015-02-22 NOTE — Progress Notes (Addendum)
Subjective:   Victoria Walker is a 64 y.o. female who presents for an Initial Medicare Annual Wellness Visit.  Review of Systems: No ROS   Cardiac Risk Factors include: advanced age (>56men, >30 women);diabetes mellitus;hypertension   Sleep patterns:   Sleeps 7-8 hours per night/gets up at least once during the night to void.  Discussed the need for night lights for fall prevention.   Home Safety/Smoke Alarms:  Feels safe at home.  Lives with husband in 1 level home with basement.  Smokes detectors and alarm system present.   Firearm Safety: Kept in a safe place.   Seat Belt Safety/Bike Helmet:  Always wears seat belt.    Counseling:   Eye Exam- Jan 2016  Dental- Goes every 6 months.   Female:  Pap-hysterectomy   Mammo-10/04/14-negative     Dexa scan-01/17/15-osteopenia     CCS- 07/21/14-polyps, repeat in 7 years.    Immunization- UTD  Advanced Care Planning:  Spent 30 mins discussing advanced care planning with patient.  Advanced Directive short form paperwork started.  Pt to complete paperwork by signing it and having it notarized by notary.     Objective:    Today's Vitals   02/22/15 0857  BP: 122/70  Pulse: 81  Height: 5\' 6"  (1.676 m)  Weight: 175 lb 9.6 oz (79.652 kg)  SpO2: 98%  PainSc: 0-No pain    Current Medications (verified) Outpatient Encounter Prescriptions as of 02/22/2015  Medication Sig  . atorvastatin (LIPITOR) 20 MG tablet Take 1 tablet (20 mg total) by mouth at bedtime. (Patient taking differently: Take 2 by mouth two days a week all other days take one daily)  . AVAPRO 150 MG tablet Take 0.5-1 tablets (75-150 mg total) by mouth daily.  . calcium-vitamin D (OSCAL WITH D) 250-125 MG-UNIT per tablet Take 1 tablet by mouth 2 (two) times daily.   . Cholecalciferol (VITAMIN D) 2000 UNITS CAPS Take 1 capsule by mouth daily.  . Ciclopirox 1 % shampoo Apply 1 each topically every 7 (seven) days.   . cycloSPORINE (RESTASIS) 0.05 % ophthalmic emulsion Place 1 drop  into both eyes 2 (two) times daily.  Marland Kitchen diltiazem (TIAZAC) 360 MG 24 hr capsule Take 1 capsule (360 mg total) by mouth every morning.  Marland Kitchen esomeprazole (NEXIUM) 40 MG capsule Take 40 mg by mouth every morning.  . fish oil-omega-3 fatty acids 1000 MG capsule Take 1 g by mouth every evening.   . gabapentin (NEURONTIN) 300 MG capsule Take 1 capsule (300 mg total) by mouth 3 (three) times daily.  Marland Kitchen glucose blood (ONE TOUCH TEST STRIPS) test strip Use as directed twice daily to check blood sugar. DX code E11.9  . levocetirizine (XYZAL) 5 MG tablet Take 5 mg by mouth every evening.  Marland Kitchen levothyroxine (SYNTHROID, LEVOTHROID) 75 MCG tablet Take one tablet Monday through Friday and one half tablet Saturday and Sunday  . loteprednol (LOTEMAX) 0.5 % ophthalmic suspension Place 1 drop into the left eye daily as needed ("arthiritis of the eye").   . metFORMIN (GLUCOPHAGE-XR) 750 MG 24 hr tablet TAKE 1 TABLET IN AM AND 1 TABLET AT LUNCH  . metoprolol succinate (TOPROL-XL) 25 MG 24 hr tablet Take 25 mg by mouth 2 (two) times daily.   . Multiple Vitamins-Minerals (MULTIVITAMIN WITH MINERALS) tablet Take 1 tablet by mouth daily with lunch.   Marland Kitchen NASONEX 50 MCG/ACT nasal spray Place 2 sprays into the nose daily as needed.   . polyvinyl alcohol-povidone (REFRESH) 1.4-0.6 % ophthalmic solution  Place 1 drop into both eyes 2 (two) times daily.  Marland Kitchen spironolactone (ALDACTONE) 25 MG tablet TAKE 1 TABLET BY MOUTH TWICE A DAY  . vitamin E 1000 UNIT capsule Take 1,000 Units by mouth daily with lunch.    No facility-administered encounter medications on file as of 02/22/2015.    Allergies (verified) Penicillins; Sulfa antibiotics; Codeine; and Vicodin   History: Past Medical History  Diagnosis Date  . Hypertension   . Neuropathy ([place])     FEET  . High cholesterol   . Hypothyroidism   . GERD (gastroesophageal reflux disease)   . Cancer [facility])     breast cancer  . Dysrhythmia     HX OF FAST HEART RATE AND PALPITATIONS -  METOPROLOL HAS HELPED  . Multinodular thyroid     PT HAVING TROUBLE SWALLOWING  . Diabetes mellitus without complication (HCC)     type 2 - ORAL MEDICATION - NO INSULIN  . H/O hiatal hernia   . Arthritis     knees, back; PT PLANS RIGHT TOTAL KNEE SURGERY IN THE FUTURE  . Claustrophobia   . H/O iritis     LEFT EYE - STATES HER EYE BECOMES RED AND VERY SENSITIVE TO LIGHT WHEN FLARE UP OF IRITIS  . History of chickenpox 09/26/2014  . History of measles   . History of shingles   . History of MRSA infection    Past Surgical History  Procedure Laterality Date  . Thyroid operation  1976    removed tumor  . Abdominal hysterectomy  1988  . Mastectomy  2007    right  . Plantar fascia surgery Left   . Tummy tuck  1986  . Hernia repair  1986  . Cardiac catheterization  1999  . Anterior and posterior repair  05/11/2012    Procedure: ANTERIOR (CYSTOCELE) AND POSTERIOR REPAIR (RECTOCELE);  Surgeon: Reece Packer, MD;  Location: [facility];  Service: Urology;;  with graft  . Vaginal prolapse repair  05/11/2012    Procedure: VAGINAL VAULT SUSPENSION;  Surgeon: Reece Packer, MD;  Location: [facility];  Service: Urology;;  . Cystoscopy with urethral dilatation  05/11/2012    Procedure: CYSTOSCOPY WITH URETHRAL DILATATION;  Surgeon: Reece Packer, MD;  Location: [facility];  Service: Urology;;  . Thyroidectomy N/A 02/23/2014    Procedure: TOTAL THYROIDECTOMY;  Surgeon: Armandina Gemma, MD;  Location: [facility];  Service: General;  Laterality: N/A;  . Skin biopsy      vulvar   Family History  Problem Relation Age of Onset  . Hypertension Mother   . Heart disease Mother     pacer, CHF  . Hyperlipidemia Mother   . Kidney disease Mother     1 kidney due to stone  . Kidney Stones Mother   . Hypertension Father   . Cancer Father     lung, psa  . Diabetes Father   . Alcohol abuse Father   . Diabetes Sister   . Cancer Sister   . Hypertension Sister   . Diabetes Brother   . Hypertension Brother     . Hyperlipidemia Brother   . Kidney disease Brother     dialysis  . Stroke Brother   . Hypertension Maternal Aunt   . Hypertension Maternal Uncle   . Hypertension Paternal Aunt   . Cancer Paternal Aunt   . Hypertension Paternal Uncle   . Hypertension Maternal Grandmother   . Heart disease Maternal Grandmother   . Stroke Maternal Grandmother   .  Hypertension Maternal Grandfather   . Hypertension Daughter   . Arthritis Son   . Hyperlipidemia Sister   . Hypertension Sister   . Diabetes Sister   . Thyroid disease Sister   . Cancer Sister     thyroid and breast  . Arthritis Sister     back pain  . Hypertension Sister   . Thyroid disease Sister   . Hypertension Brother   . Hyperlipidemia Brother   . Hypertension Brother   . Hyperlipidemia Brother   . Benign prostatic hyperplasia Brother   . Hypertension Daughter   . Hyperlipidemia Daughter   . Diabetes Daughter    Social History   Occupational History  .      Retired   Social History Main Topics  . Smoking status: Never Smoker   . Smokeless tobacco: Never Used  . Alcohol Use: No  . Drug Use: No  . Sexual Activity: Yes    Birth Control/ Protection: Surgical     Comment: lives with husband, retired from various jobs, diabetic diet    Tobacco Counseling Counseling given: Not Answered   Activities of Daily Living In your present state of health, do you have any difficulty performing the following activities: 02/22/2015 02/23/2014  Hearing? N N  Vision? N N  Difficulty concentrating or making decisions? N N  Walking or climbing stairs? N Y  Dressing or bathing? N N  Doing errands, shopping? N N  Preparing Food and eating ? N -  Using the Toilet? Y -  In the past six months, have you accidently leaked urine? N -  Do you have problems with loss of bowel control? N -  Managing your Medications? N -  Managing your Finances? N -  Housekeeping or managing your Housekeeping? N -    Immunizations and Health  Maintenance Immunization History  Administered Date(s) Administered  . Influenza,inj,Quad PF,36+ Mos 01/04/2013, 01/23/2014, 12/28/2014  . Pneumococcal Conjugate-13 12/28/2014  . Pneumococcal Polysaccharide-23 05/12/2012  . Tdap 05/02/2014   Health Maintenance Due  Topic Date Due  . Hepatitis C Screening  [birth year]  . HIV Screening  11/21/1964  . FOOT EXAM  01/24/2015  . OPHTHALMOLOGY EXAM  01/24/2015    Patient Care Team: Mosie Lukes, MD as PCP - General (Family Medicine) Juanita Craver, MD as Consulting Physician (Gastroenterology) Dianna Mindi Slicker, MD (Ophthalmology) Bruna Potter, MD as Consulting Physician (General Surgery) Leda Roys, MD as Consulting Physician (Allergy and Immunology) Servando Salina, MD as Consulting Physician (Obstetrics and Gynecology) [facility] Roxine Caddy, DPM as Consulting Physician (Podiatry) Iona Beard, MD as Referring Physician (Optometry)  Indicate any recent Medical Services you may have received from other than [facility] providers in the past year (date may be approximate).     Assessment:   This is a routine wellness examination for [facility] [place].  Hearing/Vision screen  Hearing Screening   125Hz  250Hz  500Hz  1000Hz  2000Hz  4000Hz  8000Hz   Right ear:   Pass Pass Pass Pass   Left ear:   Pass Pass Pass Pass   Comments: No difficulty hearing.   Vision Screening Comments: No changes in vision. Last eye exam:  Jan 2016 Next appt: 03/13/15-[facility], Plans to schedule eye appt Jan 2017 Rosita Kea  Dietary issues and exercise activities discussed: Current Exercise Habits:: The patient does not participate in regular exercise at present (Due to knee pain. )  Diet:  Eats 2 meals and 1 snack per day.  Relatively healthy.  Loves sweets.    Goals    .  Increase physical activity    . Lose 10 lbs by next year    . Right knee surgery       Depression Screen PHQ 2/9 Scores 02/22/2015 01/23/2014  PHQ - 2 Score 0 0    Fall Risk Fall Risk   02/22/2015 01/23/2014 11/14/2013  Falls in the past year? No No No    Cognitive Function: MMSE - Mini Mental State Exam 02/22/2015  Orientation to time 5  Orientation to Place 5  Registration 3  Attention/ Calculation 5  Recall 1  Language- name 2 objects 2  Language- repeat 1  Language- follow 3 step command 3  Language- read & follow direction 1  Write a sentence 1  Copy design 1  Total score 28    Screening Tests Health Maintenance  Topic Date Due  . Hepatitis C Screening  04-30-1950  . HIV Screening  11/21/1964  . FOOT EXAM  01/24/2015  . OPHTHALMOLOGY EXAM  01/24/2015  . HEMOGLOBIN A1C  06/27/2015  . INFLUENZA VACCINE  11/20/2015  . URINE MICROALBUMIN  12/28/2015  . MAMMOGRAM  10/03/2016  . PNA vac Low Risk Adult (2 of 2 - PPSV23) 05/12/2017  . TETANUS/TDAP  05/02/2024  . COLONOSCOPY  07/20/2024  . DEXA SCAN  Completed  . ZOSTAVAX  Addressed      Plan:  Go to lab for blood work.    Eat a healthy diet and exercise as tolerated.    Have advanced directive signed and notarized.  Bring back a copy at your next visit.  Follow up with Dr. Charlett Blake as scheduled.    Schedule Medicare Wellness for next year.    During the course of the visit, [place] was educated and counseled about the following appropriate screening and preventive services:   Vaccines to include Pneumoccal, Influenza, Hepatitis B, Td, Zostavax, HCV  Electrocardiogram  Cardiovascular disease screening  Colorectal cancer screening  Bone density screening  Diabetes screening  Glaucoma screening  Mammography/PAP  Nutrition counseling  Smoking cessation counseling  Patient Instructions (the written plan) were given to the patient.    Rudene Anda, RN   02/22/2015

## 2015-02-22 NOTE — Progress Notes (Signed)
Pre visit review using our clinic review tool, if applicable. No additional management support is needed unless otherwise documented below in the visit note. 

## 2015-02-23 LAB — HIV ANTIBODY (ROUTINE TESTING W REFLEX): HIV 1&2 Ab, 4th Generation: NONREACTIVE

## 2015-02-23 LAB — HEPATITIS C ANTIBODY: HCV Ab: NEGATIVE

## 2015-03-12 ENCOUNTER — Other Ambulatory Visit: Payer: Self-pay | Admitting: Family Medicine

## 2015-04-02 ENCOUNTER — Telehealth: Payer: Self-pay | Admitting: *Deleted

## 2015-04-02 NOTE — Telephone Encounter (Signed)
Order for post-surgical bras and non-silicone breast prosthesis received and forwarded to Dr. Charlett Blake. JG//CMA

## 2015-04-03 ENCOUNTER — Ambulatory Visit (INDEPENDENT_AMBULATORY_CARE_PROVIDER_SITE_OTHER): Payer: Medicare Other | Admitting: Family Medicine

## 2015-04-03 ENCOUNTER — Other Ambulatory Visit (HOSPITAL_COMMUNITY)
Admission: RE | Admit: 2015-04-03 | Discharge: 2015-04-03 | Disposition: A | Discharge status: Home / Self Care | Payer: Medicare Other | Source: Ambulatory Visit | Attending: Family Medicine | Admitting: Family Medicine

## 2015-04-03 ENCOUNTER — Encounter: Payer: Self-pay | Admitting: Family Medicine

## 2015-04-03 VITALS — BP 112/78 | HR 84 | Temp 98.7°F | Ht 65.0 in | Wt 177.5 lb

## 2015-04-03 DIAGNOSIS — I10 Essential (primary) hypertension: Secondary | ICD-10-CM | POA: Diagnosis not present

## 2015-04-03 DIAGNOSIS — N941 Unspecified dyspareunia: Secondary | ICD-10-CM | POA: Diagnosis not present

## 2015-04-03 DIAGNOSIS — R300 Dysuria: Secondary | ICD-10-CM

## 2015-04-03 DIAGNOSIS — E782 Mixed hyperlipidemia: Secondary | ICD-10-CM

## 2015-04-03 DIAGNOSIS — E0842 Diabetes mellitus due to underlying condition with diabetic polyneuropathy: Secondary | ICD-10-CM | POA: Diagnosis not present

## 2015-04-03 DIAGNOSIS — E1140 Type 2 diabetes mellitus with diabetic neuropathy, unspecified: Secondary | ICD-10-CM | POA: Diagnosis not present

## 2015-04-03 DIAGNOSIS — E1149 Type 2 diabetes mellitus with other diabetic neurological complication: Secondary | ICD-10-CM

## 2015-04-03 DIAGNOSIS — E785 Hyperlipidemia, unspecified: Secondary | ICD-10-CM

## 2015-04-03 DIAGNOSIS — E039 Hypothyroidism, unspecified: Secondary | ICD-10-CM | POA: Diagnosis not present

## 2015-04-03 DIAGNOSIS — Z113 Encounter for screening for infections with a predominantly sexual mode of transmission: Secondary | ICD-10-CM | POA: Insufficient documentation

## 2015-04-03 DIAGNOSIS — N951 Menopausal and female climacteric states: Secondary | ICD-10-CM

## 2015-04-03 DIAGNOSIS — K219 Gastro-esophageal reflux disease without esophagitis: Secondary | ICD-10-CM

## 2015-04-03 DIAGNOSIS — M8580 Other specified disorders of bone density and structure, unspecified site: Secondary | ICD-10-CM

## 2015-04-03 DIAGNOSIS — N760 Acute vaginitis: Secondary | ICD-10-CM | POA: Insufficient documentation

## 2015-04-03 DIAGNOSIS — Z0000 Encounter for general adult medical examination without abnormal findings: Secondary | ICD-10-CM

## 2015-04-03 LAB — URINALYSIS
Bilirubin Urine: NEGATIVE
Hgb urine dipstick: NEGATIVE
Ketones, ur: NEGATIVE
Leukocytes, UA: NEGATIVE
Nitrite: NEGATIVE
Specific Gravity, Urine: 1.015 (ref 1.000–1.030)
Total Protein, Urine: NEGATIVE
Urine Glucose: NEGATIVE
Urobilinogen, UA: 0.2 (ref 0.0–1.0)
pH: 6.5 (ref 5.0–8.0)

## 2015-04-03 LAB — LIPID PANEL
Cholesterol: 178 mg/dL (ref 0–200)
HDL: 49.9 mg/dL (ref >39.00)
NonHDL: 128.37
Total CHOL/HDL Ratio: 4
Triglycerides: 265 mg/dL — ABNORMAL HIGH (ref 0.0–149.0)
VLDL: 53 mg/dL — ABNORMAL HIGH (ref 0.0–40.0)

## 2015-04-03 LAB — LDL CHOLESTEROL, DIRECT: Direct LDL: 101 mg/dL

## 2015-04-03 LAB — COMPREHENSIVE METABOLIC PANEL
ALT: 20 U/L (ref 0–35)
AST: 17 U/L (ref 0–37)
Albumin: 4.5 g/dL (ref 3.5–5.2)
Alkaline Phosphatase: 53 U/L (ref 39–117)
BUN: 14 mg/dL (ref 6–23)
CO2: 30 mEq/L (ref 19–32)
Calcium: 9.7 mg/dL (ref 8.4–10.5)
Chloride: 104 mEq/L (ref 96–112)
Creatinine, Ser: 0.89 mg/dL (ref 0.40–1.20)
GFR: 81.77 mL/min (ref >60.00)
Glucose, Bld: 83 mg/dL (ref 70–99)
Potassium: 4.4 mEq/L (ref 3.5–5.1)
Sodium: 141 mEq/L (ref 135–145)
Total Bilirubin: 0.4 mg/dL (ref 0.2–1.2)
Total Protein: 7 g/dL (ref 6.0–8.3)

## 2015-04-03 LAB — HEMOGLOBIN A1C: Hgb A1c MFr Bld: 6.7 % — ABNORMAL HIGH (ref 4.6–6.5)

## 2015-04-03 LAB — TSH: TSH: 2.16 u[IU]/mL (ref 0.35–4.50)

## 2015-04-03 LAB — VITAMIN D 25 HYDROXY (VIT D DEFICIENCY, FRACTURES): VITD: 47.63 ng/mL (ref 30.00–100.00)

## 2015-04-03 LAB — CBC
HCT: 41.4 % (ref 36.0–46.0)
Hemoglobin: 13.3 g/dL (ref 12.0–15.0)
MCHC: 32.2 g/dL (ref 30.0–36.0)
MCV: 97.5 fl (ref 78.0–100.0)
Platelets: 266 10*3/uL (ref 150.0–400.0)
RBC: 4.24 Mil/uL (ref 3.87–5.11)
RDW: 14.1 % (ref 11.5–15.5)
WBC: 8.7 10*3/uL (ref 4.0–10.5)

## 2015-04-03 MED ORDER — LEVOTHYROXINE SODIUM 75 MCG PO TABS: ORAL_TABLET | ORAL | Status: DC

## 2015-04-03 MED ORDER — ATORVASTATIN CALCIUM 20 MG PO TABS: 20.0000 mg | ORAL_TABLET | Freq: Every day | ORAL | Status: DC

## 2015-04-03 MED ORDER — DILTIAZEM HCL ER BEADS 360 MG PO CP24: 360.0000 mg | ORAL_CAPSULE | Freq: Every morning | ORAL | Status: DC

## 2015-04-03 NOTE — Telephone Encounter (Signed)
Signed order faxed. Sent for scanning. JG//CMA

## 2015-04-03 NOTE — Patient Instructions (Signed)
Consider a multistrain probiotic such as [employer] or [employer] or [place] for Adults, Female A healthy lifestyle and preventive care can promote health and wellness. Preventive health guidelines for women include the following key practices.  A routine yearly physical is a good way to check with your health care provider about your health and preventive screening. It is a chance to share any concerns and updates on your health and to receive a thorough exam.  Visit your dentist for a routine exam and preventive care every 6 months. Brush your teeth twice a day and floss once a day. Good oral hygiene prevents tooth decay and gum disease.  The frequency of eye exams is based on your age, health, family medical history, use of contact lenses, and other factors. Follow your health care provider's recommendations for frequency of eye exams.  Eat a healthy diet. Foods like vegetables, fruits, whole grains, low-fat dairy products, and lean protein foods contain the nutrients you need without too many calories. Decrease your intake of foods high in solid fats, added sugars, and salt. Eat the right amount of calories for you.Get information about a proper diet from your health care provider, if necessary.  Regular physical exercise is one of the most important things you can do for your health. Most adults should get at least 150 minutes of moderate-intensity exercise (any activity that increases your heart rate and causes you to sweat) each week. In addition, most adults need muscle-strengthening exercises on 2 or more days a week.  Maintain a healthy weight. The body mass index (BMI) is a screening tool to identify possible weight problems. It provides an estimate of body fat based on height and weight. Your health care provider can find your BMI and can help you achieve or maintain a healthy weight.For adults 20 years and older:  A BMI below 18.5 is  considered underweight.  A BMI of 18.5 to 24.9 is normal.  A BMI of 25 to 29.9 is considered overweight.  A BMI of 30 and above is considered obese.  Maintain normal blood lipids and cholesterol levels by exercising and minimizing your intake of saturated fat. Eat a balanced diet with plenty of fruit and vegetables. Blood tests for lipids and cholesterol should begin at age 59 and be repeated every 5 years. If your lipid or cholesterol levels are high, you are over 50, or you are at high risk for heart disease, you may need your cholesterol levels checked more frequently.Ongoing high lipid and cholesterol levels should be treated with medicines if diet and exercise are not working.  If you smoke, find out from your health care provider how to quit. If you do not use tobacco, do not start.  Lung cancer screening is recommended for adults aged 49-80 years who are at high risk for developing lung cancer because of a history of smoking. A yearly low-dose CT scan of the lungs is recommended for people who have at least a 30-pack-year history of smoking and are a current smoker or have quit within the past 15 years. A pack year of smoking is smoking an average of 1 pack of cigarettes a day for 1 year (for example: 1 pack a day for 30 years or 2 packs a day for 15 years). Yearly screening should continue until the smoker has stopped smoking for at least 15 years. Yearly screening should be stopped for people who develop a health problem that would prevent  them from having lung cancer treatment.  If you are pregnant, do not drink alcohol. If you are breastfeeding, be very cautious about drinking alcohol. If you are not pregnant and choose to drink alcohol, do not have more than 1 drink per day. One drink is considered to be 12 ounces (355 mL) of beer, 5 ounces (148 mL) of wine, or 1.5 ounces (44 mL) of liquor.  Avoid use of street drugs. Do not share needles with anyone. Ask for help if you need support or  instructions about stopping the use of drugs.  High blood pressure causes heart disease and increases the risk of stroke. Your blood pressure should be checked at least every 1 to 2 years. Ongoing high blood pressure should be treated with medicines if weight loss and exercise do not work.  If you are 21-58 years old, ask your health care provider if you should take aspirin to prevent strokes.  Diabetes screening is done by taking a blood sample to check your blood glucose level after you have not eaten for a certain period of time (fasting). If you are not overweight and you do not have risk factors for diabetes, you should be screened once every 3 years starting at age 84. If you are overweight or obese and you are 93-20 years of age, you should be screened for diabetes every year as part of your cardiovascular risk assessment.  Breast cancer screening is essential preventive care for women. You should practice "breast self-awareness." This means understanding the normal appearance and feel of your breasts and may include breast self-examination. Any changes detected, no matter how small, should be reported to a health care provider. Women in their 40s and 30s should have a clinical breast exam (CBE) by a health care provider as part of a regular health exam every 1 to 3 years. After age 62, women should have a CBE every year. Starting at age 23, women should consider having a mammogram (breast X-ray test) every year. Women who have a family history of breast cancer should talk to their health care provider about genetic screening. Women at a high risk of breast cancer should talk to their health care providers about having an MRI and a mammogram every year.  Breast cancer gene (BRCA)-related cancer risk assessment is recommended for women who have family members with BRCA-related cancers. BRCA-related cancers include breast, ovarian, tubal, and peritoneal cancers. Having family members with these  cancers may be associated with an increased risk for harmful changes (mutations) in the breast cancer genes BRCA1 and BRCA2. Results of the assessment will determine the need for genetic counseling and BRCA1 and BRCA2 testing.  Your health care provider may recommend that you be screened regularly for cancer of the pelvic organs (ovaries, uterus, and vagina). This screening involves a pelvic examination, including checking for microscopic changes to the surface of your cervix (Pap test). You may be encouraged to have this screening done every 3 years, beginning at age 69.  For women ages 23-65, health care providers may recommend pelvic exams and Pap testing every 3 years, or they may recommend the Pap and pelvic exam, combined with testing for human papilloma virus (HPV), every 5 years. Some types of HPV increase your risk of cervical cancer. Testing for HPV may also be done on women of any age with unclear Pap test results.  Other health care providers may not recommend any screening for nonpregnant women who are considered low risk for pelvic cancer and  who do not have symptoms. Ask your health care provider if a screening pelvic exam is right for you.  If you have had past treatment for cervical cancer or a condition that could lead to cancer, you need Pap tests and screening for cancer for at least 20 years after your treatment. If Pap tests have been discontinued, your risk factors (such as having a new sexual partner) need to be reassessed to determine if screening should resume. Some women have medical problems that increase the chance of getting cervical cancer. In these cases, your health care provider may recommend more frequent screening and Pap tests.  Colorectal cancer can be detected and often prevented. Most routine colorectal cancer screening begins at the age of 69 years and continues through age 49 years. However, your health care provider may recommend screening at an earlier age if you  have risk factors for colon cancer. On a yearly basis, your health care provider may provide home test kits to check for hidden blood in the stool. Use of a small camera at the end of a tube, to directly examine the colon (sigmoidoscopy or colonoscopy), can detect the earliest forms of colorectal cancer. Talk to your health care provider about this at age 6, when routine screening begins. Direct exam of the colon should be repeated every 5-10 years through age 41 years, unless early forms of precancerous polyps or small growths are found.  People who are at an increased risk for hepatitis B should be screened for this virus. You are considered at high risk for hepatitis B if:  You were born in a country where hepatitis B occurs often. Talk with your health care provider about which countries are considered high risk.  Your parents were born in a high-risk country and you have not received a shot to protect against hepatitis B (hepatitis B vaccine).  You have HIV or AIDS.  You use needles to inject street drugs.  You live with, or have sex with, someone who has hepatitis B.  You get hemodialysis treatment.  You take certain medicines for conditions like cancer, organ transplantation, and autoimmune conditions.  Hepatitis C blood testing is recommended for all people born from [birth year] through 1965 and any individual with known risks for hepatitis C.  Practice safe sex. Use condoms and avoid high-risk sexual practices to reduce the spread of sexually transmitted infections (STIs). STIs include gonorrhea, chlamydia, syphilis, trichomonas, herpes, HPV, and human immunodeficiency virus (HIV). Herpes, HIV, and HPV are viral illnesses that have no cure. They can result in disability, cancer, and death.  You should be screened for sexually transmitted illnesses (STIs) including gonorrhea and chlamydia if:  You are sexually active and are younger than 24 years.  You are older than 24 years and your  health care provider tells you that you are at risk for this type of infection.  Your sexual activity has changed since you were last screened and you are at an increased risk for chlamydia or gonorrhea. Ask your health care provider if you are at risk.  If you are at risk of being infected with HIV, it is recommended that you take a prescription medicine daily to prevent HIV infection. This is called preexposure prophylaxis (PrEP). You are considered at risk if:  You are sexually active and do not regularly use condoms or know the HIV status of your partner(s).  You take drugs by injection.  You are sexually active with a partner who has HIV.  Talk  with your health care provider about whether you are at high risk of being infected with HIV. If you choose to begin PrEP, you should first be tested for HIV. You should then be tested every 3 months for as long as you are taking PrEP.  Osteoporosis is a disease in which the bones lose minerals and strength with aging. This can result in serious bone fractures or breaks. The risk of osteoporosis can be identified using a bone density scan. Women ages 37 years and over and women at risk for fractures or osteoporosis should discuss screening with their health care providers. Ask your health care provider whether you should take a calcium supplement or vitamin D to reduce the rate of osteoporosis.  Menopause can be associated with physical symptoms and risks. Hormone replacement therapy is available to decrease symptoms and risks. You should talk to your health care provider about whether hormone replacement therapy is right for you.  Use sunscreen. Apply sunscreen liberally and repeatedly throughout the day. You should seek shade when your shadow is shorter than you. Protect yourself by wearing long sleeves, pants, a wide-brimmed hat, and sunglasses year round, whenever you are outdoors.  Once a month, do a whole body skin exam, using a mirror to look  at the skin on your back. Tell your health care provider of new moles, moles that have irregular borders, moles that are larger than a pencil eraser, or moles that have changed in shape or color.  Stay current with required vaccines (immunizations).  Influenza vaccine. All adults should be immunized every year.  Tetanus, diphtheria, and acellular pertussis (Td, Tdap) vaccine. Pregnant women should receive 1 dose of Tdap vaccine during each pregnancy. The dose should be obtained regardless of the length of time since the last dose. Immunization is preferred during the 27th-36th week of gestation. An adult who has not previously received Tdap or who does not know her vaccine status should receive 1 dose of Tdap. This initial dose should be followed by tetanus and diphtheria toxoids (Td) booster doses every 10 years. Adults with an unknown or incomplete history of completing a 3-dose immunization series with Td-containing vaccines should begin or complete a primary immunization series including a Tdap dose. Adults should receive a Td booster every 10 years.  Varicella vaccine. An adult without evidence of immunity to varicella should receive 2 doses or a second dose if she has previously received 1 dose. Pregnant females who do not have evidence of immunity should receive the first dose after pregnancy. This first dose should be obtained before leaving the health care facility. The second dose should be obtained 4-8 weeks after the first dose.  Human papillomavirus (HPV) vaccine. Females aged 13-26 years who have not received the vaccine previously should obtain the 3-dose series. The vaccine is not recommended for use in pregnant females. However, pregnancy testing is not needed before receiving a dose. If a female is found to be pregnant after receiving a dose, no treatment is needed. In that case, the remaining doses should be delayed until after the pregnancy. Immunization is recommended for any person  with an immunocompromised condition through the age of 49 years if she did not get any or all doses earlier. During the 3-dose series, the second dose should be obtained 4-8 weeks after the first dose. The third dose should be obtained 24 weeks after the first dose and 16 weeks after the second dose.  Zoster vaccine. One dose is recommended for adults aged  60 years or older unless certain conditions are present.  Measles, mumps, and rubella (MMR) vaccine. Adults born before [birth year] generally are considered immune to measles and mumps. Adults born in [birth year] or later should have 1 or more doses of MMR vaccine unless there is a contraindication to the vaccine or there is laboratory evidence of immunity to each of the three diseases. A routine second dose of MMR vaccine should be obtained at least 28 days after the first dose for students attending postsecondary schools, health care workers, or international travelers. People who received inactivated measles vaccine or an unknown type of measles vaccine during 1963-1967 should receive 2 doses of MMR vaccine. People who received inactivated mumps vaccine or an unknown type of mumps vaccine before 1979 and are at high risk for mumps infection should consider immunization with 2 doses of MMR vaccine. For females of childbearing age, rubella immunity should be determined. If there is no evidence of immunity, females who are not pregnant should be vaccinated. If there is no evidence of immunity, females who are pregnant should delay immunization until after pregnancy. Unvaccinated health care workers born before [birth year] who lack laboratory evidence of measles, mumps, or rubella immunity or laboratory confirmation of disease should consider measles and mumps immunization with 2 doses of MMR vaccine or rubella immunization with 1 dose of MMR vaccine.  Pneumococcal 13-valent conjugate (PCV13) vaccine. When indicated, a person who is uncertain of his immunization history and has  no record of immunization should receive the PCV13 vaccine. All adults 36 years of age and older should receive this vaccine. An adult aged 63 years or older who has certain medical conditions and has not been previously immunized should receive 1 dose of PCV13 vaccine. This PCV13 should be followed with a dose of pneumococcal polysaccharide (PPSV23) vaccine. Adults who are at high risk for pneumococcal disease should obtain the PPSV23 vaccine at least 8 weeks after the dose of PCV13 vaccine. Adults older than 65 years of age who have normal immune system function should obtain the PPSV23 vaccine dose at least 1 year after the dose of PCV13 vaccine.  Pneumococcal polysaccharide (PPSV23) vaccine. When PCV13 is also indicated, PCV13 should be obtained first. All adults aged 67 years and older should be immunized. An adult younger than age 28 years who has certain medical conditions should be immunized. Any person who resides in a nursing home or long-term care facility should be immunized. An adult smoker should be immunized. People with an immunocompromised condition and certain other conditions should receive both PCV13 and PPSV23 vaccines. People with human immunodeficiency virus (HIV) infection should be immunized as soon as possible after diagnosis. Immunization during chemotherapy or radiation therapy should be avoided. Routine use of PPSV23 vaccine is not recommended for American Indians, [place] Natives, or people younger than 65 years unless there are medical conditions that require PPSV23 vaccine. When indicated, people who have unknown immunization and have no record of immunization should receive PPSV23 vaccine. One-time revaccination 5 years after the first dose of PPSV23 is recommended for people aged 19-64 years who have chronic kidney failure, nephrotic syndrome, asplenia, or immunocompromised conditions. People who received 1-2 doses of PPSV23 before age 70 years should receive another dose of PPSV23  vaccine at age 19 years or later if at least 5 years have passed since the previous dose. Doses of PPSV23 are not needed for people immunized with PPSV23 at or after age 57 years.  Meningococcal vaccine. Adults with asplenia or persistent  complement component deficiencies should receive 2 doses of quadrivalent meningococcal conjugate (MenACWY-D) vaccine. The doses should be obtained at least 2 months apart. Microbiologists working with certain meningococcal bacteria, [employer] recruits, people at risk during an outbreak, and people who travel to or live in countries with a high rate of meningitis should be immunized. A first-year college student up through age 1 years who is living in a residence hall should receive a dose if she did not receive a dose on or after her 16th birthday. Adults who have certain high-risk conditions should receive one or more doses of vaccine.  Hepatitis A vaccine. Adults who wish to be protected from this disease, have certain high-risk conditions, work with hepatitis A-infected animals, work in hepatitis A research labs, or travel to or work in countries with a high rate of hepatitis A should be immunized. Adults who were previously unvaccinated and who anticipate close contact with an international adoptee during the first 60 days after arrival in the [place] from a country with a high rate of hepatitis A should be immunized.  Hepatitis B vaccine. Adults who wish to be protected from this disease, have certain high-risk conditions, may be exposed to blood or other infectious body fluids, are household contacts or sex partners of hepatitis B positive people, are clients or workers in certain care facilities, or travel to or work in countries with a high rate of hepatitis B should be immunized.  Haemophilus influenzae type b (Hib) vaccine. A previously unvaccinated person with asplenia or sickle cell disease or having a scheduled splenectomy should receive 1 dose of Hib  vaccine. Regardless of previous immunization, a recipient of a hematopoietic stem cell transplant should receive a 3-dose series 6-12 months after her successful transplant. Hib vaccine is not recommended for adults with HIV infection. Preventive Services / Frequency Ages 19 to 68 years  Blood pressure check.** / Every 3-5 years.  Lipid and cholesterol check.** / Every 5 years beginning at age 16.  Clinical breast exam.** / Every 3 years for women in their 32s and 18s.  BRCA-related cancer risk assessment.** / For women who have family members with a BRCA-related cancer (breast, ovarian, tubal, or peritoneal cancers).  Pap test.** / Every 2 years from ages 59 through 41. Every 3 years starting at age 27 through age 62 or 67 with a history of 3 consecutive normal Pap tests.  HPV screening.** / Every 3 years from ages 39 through ages 42 to 47 with a history of 3 consecutive normal Pap tests.  Hepatitis C blood test.** / For any individual with known risks for hepatitis C.  Skin self-exam. / Monthly.  Influenza vaccine. / Every year.  Tetanus, diphtheria, and acellular pertussis (Tdap, Td) vaccine.** / Consult your health care provider. Pregnant women should receive 1 dose of Tdap vaccine during each pregnancy. 1 dose of Td every 10 years.  Varicella vaccine.** / Consult your health care provider. Pregnant females who do not have evidence of immunity should receive the first dose after pregnancy.  HPV vaccine. / 3 doses over 6 months, if 25 and younger. The vaccine is not recommended for use in pregnant females. However, pregnancy testing is not needed before receiving a dose.  Measles, mumps, rubella (MMR) vaccine.** / You need at least 1 dose of MMR if you were born in 1957 or later. You may also need a 2nd dose. For females of childbearing age, rubella immunity should be determined. If there is no evidence of immunity,  females who are not pregnant should be vaccinated. If there is no  evidence of immunity, females who are pregnant should delay immunization until after pregnancy.  Pneumococcal 13-valent conjugate (PCV13) vaccine.** / Consult your health care provider.  Pneumococcal polysaccharide (PPSV23) vaccine.** / 1 to 2 doses if you smoke cigarettes or if you have certain conditions.  Meningococcal vaccine.** / 1 dose if you are age 35 to 11 years and a Market researcher living in a residence hall, or have one of several medical conditions, you need to get vaccinated against meningococcal disease. You may also need additional booster doses.  Hepatitis A vaccine.** / Consult your health care provider.  Hepatitis B vaccine.** / Consult your health care provider.  Haemophilus influenzae type b (Hib) vaccine.** / Consult your health care provider. Ages 86 to 46 years  Blood pressure check.** / Every year.  Lipid and cholesterol check.** / Every 5 years beginning at age 40 years.  Lung cancer screening. / Every year if you are aged 83-80 years and have a 30-pack-year history of smoking and currently smoke or have quit within the past 15 years. Yearly screening is stopped once you have quit smoking for at least 15 years or develop a health problem that would prevent you from having lung cancer treatment.  Clinical breast exam.** / Every year after age 2 years.  BRCA-related cancer risk assessment.** / For women who have family members with a BRCA-related cancer (breast, ovarian, tubal, or peritoneal cancers).  Mammogram.** / Every year beginning at age 29 years and continuing for as long as you are in good health. Consult with your health care provider.  Pap test.** / Every 3 years starting at age 56 years through age 18 or 43 years with a history of 3 consecutive normal Pap tests.  HPV screening.** / Every 3 years from ages 53 years through ages 51 to 71 years with a history of 3 consecutive normal Pap tests.  Fecal occult blood test (FOBT) of stool. /  Every year beginning at age 36 years and continuing until age 51 years. You may not need to do this test if you get a colonoscopy every 10 years.  Flexible sigmoidoscopy or colonoscopy.** / Every 5 years for a flexible sigmoidoscopy or every 10 years for a colonoscopy beginning at age 58 years and continuing until age 34 years.  Hepatitis C blood test.** / For all people born from [birth year] through 1966 and any individual with known risks for hepatitis C.  Skin self-exam. / Monthly.  Influenza vaccine. / Every year.  Tetanus, diphtheria, and acellular pertussis (Tdap/Td) vaccine.** / Consult your health care provider. Pregnant women should receive 1 dose of Tdap vaccine during each pregnancy. 1 dose of Td every 10 years.  Varicella vaccine.** / Consult your health care provider. Pregnant females who do not have evidence of immunity should receive the first dose after pregnancy.  Zoster vaccine.** / 1 dose for adults aged 23 years or older.  Measles, mumps, rubella (MMR) vaccine.** / You need at least 1 dose of MMR if you were born in 1957 or later. You may also need a second dose. For females of childbearing age, rubella immunity should be determined. If there is no evidence of immunity, females who are not pregnant should be vaccinated. If there is no evidence of immunity, females who are pregnant should delay immunization until after pregnancy.  Pneumococcal 13-valent conjugate (PCV13) vaccine.** / Consult your health care provider.  Pneumococcal polysaccharide (PPSV23) vaccine.** /  1 to 2 doses if you smoke cigarettes or if you have certain conditions.  Meningococcal vaccine.** / Consult your health care provider.  Hepatitis A vaccine.** / Consult your health care provider.  Hepatitis B vaccine.** / Consult your health care provider.  Haemophilus influenzae type b (Hib) vaccine.** / Consult your health care provider. Ages 41 years and over  Blood pressure check.** / Every year.  Lipid  and cholesterol check.** / Every 5 years beginning at age 16 years.  Lung cancer screening. / Every year if you are aged 53-80 years and have a 30-pack-year history of smoking and currently smoke or have quit within the past 15 years. Yearly screening is stopped once you have quit smoking for at least 15 years or develop a health problem that would prevent you from having lung cancer treatment.  Clinical breast exam.** / Every year after age 100 years.  BRCA-related cancer risk assessment.** / For women who have family members with a BRCA-related cancer (breast, ovarian, tubal, or peritoneal cancers).  Mammogram.** / Every year beginning at age 65 years and continuing for as long as you are in good health. Consult with your health care provider.  Pap test.** / Every 3 years starting at age 75 years through age 81 or 65 years with 3 consecutive normal Pap tests. Testing can be stopped between 65 and 70 years with 3 consecutive normal Pap tests and no abnormal Pap or HPV tests in the past 10 years.  HPV screening.** / Every 3 years from ages 53 years through ages 41 or 56 years with a history of 3 consecutive normal Pap tests. Testing can be stopped between 65 and 70 years with 3 consecutive normal Pap tests and no abnormal Pap or HPV tests in the past 10 years.  Fecal occult blood test (FOBT) of stool. / Every year beginning at age 66 years and continuing until age 93 years. You may not need to do this test if you get a colonoscopy every 10 years.  Flexible sigmoidoscopy or colonoscopy.** / Every 5 years for a flexible sigmoidoscopy or every 10 years for a colonoscopy beginning at age 79 years and continuing until age 56 years.  Hepatitis C blood test.** / For all people born from [birth year] through 1966 and any individual with known risks for hepatitis C.  Osteoporosis screening.** / A one-time screening for women ages 46 years and over and women at risk for fractures or osteoporosis.  Skin  self-exam. / Monthly.  Influenza vaccine. / Every year.  Tetanus, diphtheria, and acellular pertussis (Tdap/Td) vaccine.** / 1 dose of Td every 10 years.  Varicella vaccine.** / Consult your health care provider.  Zoster vaccine.** / 1 dose for adults aged 52 years or older.  Pneumococcal 13-valent conjugate (PCV13) vaccine.** / Consult your health care provider.  Pneumococcal polysaccharide (PPSV23) vaccine.** / 1 dose for all adults aged 4 years and older.  Meningococcal vaccine.** / Consult your health care provider.  Hepatitis A vaccine.** / Consult your health care provider.  Hepatitis B vaccine.** / Consult your health care provider.  Haemophilus influenzae type b (Hib) vaccine.** / Consult your health care provider. ** Family history and personal history of risk and conditions may change your health care provider's recommendations.   This information is not intended to replace advice given to you by your health care provider. Make sure you discuss any questions you have with your health care provider.   Document Released: 06/03/2001 Document Revised: 04/28/2014 Document Reviewed: 09/02/2010 Elsevier  Interactive Patient Education [employer]. n probiotics daily can order at [email or web address], [employer]

## 2015-04-03 NOTE — Progress Notes (Signed)
Pre visit review using our clinic review tool, if applicable. No additional management support is needed unless otherwise documented below in the visit note. 

## 2015-04-03 NOTE — Assessment & Plan Note (Signed)
Well controlled, no changes to meds. Encouraged heart healthy diet such as the DASH diet and exercise as tolerated.  °

## 2015-04-05 LAB — URINE CYTOLOGY ANCILLARY ONLY
Chlamydia: NEGATIVE
Neisseria Gonorrhea: NEGATIVE
Trichomonas: NEGATIVE

## 2015-04-06 LAB — URINE CYTOLOGY ANCILLARY ONLY
Bacterial vaginitis: NEGATIVE
Candida vaginitis: NEGATIVE

## 2015-04-07 ENCOUNTER — Encounter: Payer: Self-pay | Admitting: Family Medicine

## 2015-04-07 DIAGNOSIS — Z0000 Encounter for general adult medical examination without abnormal findings: Secondary | ICD-10-CM

## 2015-04-07 HISTORY — DX: Encounter for general adult medical examination without abnormal findings: Z00.00

## 2015-04-07 NOTE — Assessment & Plan Note (Signed)
Patient denies any difficulties at home. No trouble with ADLs, depression or falls. See EMR for functional status screen and depression screen. No recent changes to vision or hearing. Is UTD with immunizations. Is UTD with screening. Discussed Advanced Directives. Encouraged heart healthy diet, exercise as tolerated and adequate sleep. See patient's problem list for health risk factors to monitor. See AVS for preventative healthcare recommendation schedule. Labs reviewed See Care Team function for list of other providers. Colonoscopy in 2016 with Dr Collene Mares repeat in 5 years MGM and Pap normal in 2016

## 2015-04-07 NOTE — Assessment & Plan Note (Signed)
Encouraged heart healthy diet, increase exercise, avoid trans fats, consider a krill oil cap daily 

## 2015-04-07 NOTE — Assessment & Plan Note (Signed)
Referred to gynecology for further consideration.

## 2015-04-07 NOTE — Assessment & Plan Note (Signed)
hgba1c acceptable, minimize simple carbs. Increase exercise as tolerated. Continue current meds, tolerating Gabapentin

## 2015-04-07 NOTE — Assessment & Plan Note (Signed)
Avoid offending foods, start probiotics. Do not eat large meals in late evening and consider raising head of bed.  

## 2015-04-07 NOTE — Progress Notes (Signed)
Subjective:    Patient ID: Victoria Walker, female    DOB: [birth year], 64 y.o.   MRN: [id number]  Chief Complaint  Patient presents with  . Medicare Wellness    HPI Patient is in today for an initial Medicare wellness exam numerous complaints. She is noting some recent vaginal odor and itching. This been a slight achy sensation in her lower abdomen and some minor discharge. No fevers or chills. No urinary symptoms such as frequency or urgency. She tries to maintain a heart healthy diet. Has stayed active. No recent illness. Denies CP/palp/SOB/HA/congestion/fevers/GI or GU c/o. Taking meds as prescribed  Past Medical History  Diagnosis Date  . Hypertension   . Neuropathy ([place])     FEET  . High cholesterol   . Hypothyroidism   . GERD (gastroesophageal reflux disease)   . Cancer [facility])     breast cancer  . Dysrhythmia     HX OF FAST HEART RATE AND PALPITATIONS - METOPROLOL HAS HELPED  . Multinodular thyroid     PT HAVING TROUBLE SWALLOWING  . Diabetes mellitus without complication (HCC)     type 2 - ORAL MEDICATION - NO INSULIN  . H/O hiatal hernia   . Arthritis     knees, back; PT PLANS RIGHT TOTAL KNEE SURGERY IN THE FUTURE  . Claustrophobia   . H/O iritis     LEFT EYE - STATES HER EYE BECOMES RED AND VERY SENSITIVE TO LIGHT WHEN FLARE UP OF IRITIS  . History of chickenpox 09/26/2014  . History of measles   . History of shingles   . History of MRSA infection   . Medicare annual wellness visit, initial 04/07/2015    Past Surgical History  Procedure Laterality Date  . Thyroid operation  1976    removed tumor  . Abdominal hysterectomy  1988  . Mastectomy  2007    right  . Plantar fascia surgery Left   . Tummy tuck  1986  . Hernia repair  1986  . Cardiac catheterization  1999  . Anterior and posterior repair  05/11/2012    Procedure: ANTERIOR (CYSTOCELE) AND POSTERIOR REPAIR (RECTOCELE);  Surgeon: Reece Packer, MD;  Location: [facility];  Service: Urology;;  with  graft  . Vaginal prolapse repair  05/11/2012    Procedure: VAGINAL VAULT SUSPENSION;  Surgeon: Reece Packer, MD;  Location: [facility];  Service: Urology;;  . Cystoscopy with urethral dilatation  05/11/2012    Procedure: CYSTOSCOPY WITH URETHRAL DILATATION;  Surgeon: Reece Packer, MD;  Location: [facility];  Service: Urology;;  . Thyroidectomy N/A 02/23/2014    Procedure: TOTAL THYROIDECTOMY;  Surgeon: Armandina Gemma, MD;  Location: [facility];  Service: General;  Laterality: N/A;  . Skin biopsy      vulvar    Family History  Problem Relation Age of Onset  . Hypertension Mother   . Heart disease Mother     pacer, CHF  . Hyperlipidemia Mother   . Kidney disease Mother     1 kidney due to stone  . Kidney Stones Mother   . Hypertension Father   . Cancer Father     lung, psa  . Diabetes Father   . Alcohol abuse Father   . Diabetes Sister   . Cancer Sister   . Hypertension Sister   . Diabetes Brother   . Hypertension Brother   . Hyperlipidemia Brother   . Kidney disease Brother     dialysis  . Stroke Brother   .  Hypertension Maternal Aunt   . Hypertension Maternal Uncle   . Hypertension Paternal Aunt   . Cancer Paternal Aunt   . Hypertension Paternal Uncle   . Hypertension Maternal Grandmother   . Heart disease Maternal Grandmother   . Stroke Maternal Grandmother   . Hypertension Maternal Grandfather   . Hypertension Daughter   . Arthritis Son   . Hyperlipidemia Sister   . Hypertension Sister   . Diabetes Sister   . Thyroid disease Sister   . Cancer Sister     thyroid and breast  . Arthritis Sister     back pain  . Hypertension Sister   . Thyroid disease Sister   . Hypertension Brother   . Hyperlipidemia Brother   . Hypertension Brother   . Hyperlipidemia Brother   . Benign prostatic hyperplasia Brother   . Hypertension Daughter   . Hyperlipidemia Daughter   . Diabetes Daughter     Social History   Social History  . Marital Status: Married    Spouse Name: N/A   . Number of Children: 3  . Years of Education: N/A   Occupational History  .      Retired   Social History Main Topics  . Smoking status: Never Smoker   . Smokeless tobacco: Never Used  . Alcohol Use: No  . Drug Use: No  . Sexual Activity: Yes    Birth Control/ Protection: Surgical     Comment: lives with husband, retired from various jobs, diabetic diet   Other Topics Concern  . Not on file   Social History Narrative    Outpatient Prescriptions Prior to Visit  Medication Sig Dispense Refill  . AVAPRO 150 MG tablet Take 0.5-1 tablets (75-150 mg total) by mouth daily. 30 tablet 5  . calcium-vitamin D (OSCAL WITH D) 250-125 MG-UNIT per tablet Take 1 tablet by mouth 2 (two) times daily.     . Cholecalciferol (VITAMIN D) 2000 UNITS CAPS Take 1 capsule by mouth daily.    . Ciclopirox 1 % shampoo Apply 1 each topically every 7 (seven) days.     . cycloSPORINE (RESTASIS) 0.05 % ophthalmic emulsion Place 1 drop into both eyes 2 (two) times daily.    Marland Kitchen esomeprazole (NEXIUM) 40 MG capsule Take 40 mg by mouth every morning.    . fish oil-omega-3 fatty acids 1000 MG capsule Take 1 g by mouth every evening.     . gabapentin (NEURONTIN) 300 MG capsule Take 1 capsule (300 mg total) by mouth 3 (three) times daily. 270 capsule 1  . glucose blood (ONE TOUCH TEST STRIPS) test strip Use as directed twice daily to check blood sugar. DX code E11.9 300 each 1  . levocetirizine (XYZAL) 5 MG tablet Take 5 mg by mouth every evening.    . loteprednol (LOTEMAX) 0.5 % ophthalmic suspension Place 1 drop into the left eye daily as needed ("arthiritis of the eye").     . metFORMIN (GLUCOPHAGE-XR) 750 MG 24 hr tablet TAKE 1 TABLET IN AM AND 1 TABLET AT LUNCH 180 tablet 1  . metoprolol succinate (TOPROL-XL) 25 MG 24 hr tablet Take 25 mg by mouth 2 (two) times daily.     . Multiple Vitamins-Minerals (MULTIVITAMIN WITH MINERALS) tablet Take 1 tablet by mouth daily with lunch.     Marland Kitchen NASONEX 50 MCG/ACT nasal spray  Place 2 sprays into the nose daily as needed.     . polyvinyl alcohol-povidone (REFRESH) 1.4-0.6 % ophthalmic solution Place 1 drop into both  eyes 2 (two) times daily.    Marland Kitchen spironolactone (ALDACTONE) 25 MG tablet TAKE 1 TABLET BY MOUTH TWICE A DAY 180 tablet 1  . vitamin E 1000 UNIT capsule Take 1,000 Units by mouth daily with lunch.     Marland Kitchen atorvastatin (LIPITOR) 20 MG tablet Take 1 tablet (20 mg total) by mouth at bedtime. (Patient taking differently: Take 2 by mouth two days a week all other days take one daily) 90 tablet 1  . diltiazem (TIAZAC) 360 MG 24 hr capsule Take 1 capsule (360 mg total) by mouth every morning. 90 capsule 3  . levothyroxine (SYNTHROID, LEVOTHROID) 75 MCG tablet Take one tablet Monday through Friday and one half tablet Saturday and Sunday 90 tablet 1   No facility-administered medications prior to visit.    Allergies  Allergen Reactions  . Penicillins Itching  . Sulfa Antibiotics Itching  . Codeine Anxiety    Jittery   . Vicodin [Hydrocodone-Acetaminophen]     ITCHING     Review of Systems  Constitutional: Negative for fever and malaise/fatigue.  HENT: Negative for congestion.   Eyes: Negative for discharge.  Respiratory: Negative for shortness of breath.   Cardiovascular: Negative for chest pain, palpitations and leg swelling.  Gastrointestinal: Negative for nausea and abdominal pain.  Genitourinary: Negative for dysuria.  Musculoskeletal: Negative for falls.  Skin: Negative for rash.  Neurological: Negative for loss of consciousness and headaches.  Endo/Heme/Allergies: Negative for environmental allergies.  Psychiatric/Behavioral: Negative for depression. The patient is not nervous/anxious.        Objective:    Physical Exam  Constitutional: She is oriented to person, place, and time. She appears well-developed and well-nourished. No distress.  HENT:  Head: Normocephalic and atraumatic.  Eyes: Conjunctivae are normal.  Neck: Neck supple. No  thyromegaly present.  Cardiovascular: Normal rate, regular rhythm and normal heart sounds.   No murmur heard. Pulmonary/Chest: Effort normal and breath sounds normal. No respiratory distress.  Abdominal: Soft. Bowel sounds are normal. She exhibits no distension and no mass. There is no tenderness.  Musculoskeletal: She exhibits no edema.  Lymphadenopathy:    She has no cervical adenopathy.  Neurological: She is alert and oriented to person, place, and time.  Skin: Skin is warm and dry.  Psychiatric: She has a normal mood and affect. Her behavior is normal.    BP 112/78 mmHg  Pulse 84  Temp(Src) 98.7 F (37.1 C) (Oral)  Ht 5\' 5"  (1.651 m)  Wt 177 lb 8 oz (80.513 kg)  BMI 29.54 kg/m2  SpO2 97% Wt Readings from Last 3 Encounters:  04/03/15 177 lb 8 oz (80.513 kg)  02/22/15 175 lb 9.6 oz (79.652 kg)  01/08/15 172 lb 8 oz (78.245 kg)     Lab Results  Component Value Date   WBC 8.7 04/03/2015   HGB 13.3 04/03/2015   HCT 41.4 04/03/2015   PLT 266.0 04/03/2015   GLUCOSE 83 04/03/2015   CHOL 178 04/03/2015   TRIG 265.0* 04/03/2015   HDL 49.90 04/03/2015   LDLDIRECT 101.0 04/03/2015   LDLCALC 86 10/11/2013   ALT 20 04/03/2015   AST 17 04/03/2015   NA 141 04/03/2015   K 4.4 04/03/2015   CL 104 04/03/2015   CREATININE 0.89 04/03/2015   BUN 14 04/03/2015   CO2 30 04/03/2015   TSH 2.16 04/03/2015   INR 0.97 05/03/2012   HGBA1C 6.7* 04/03/2015   MICROALBUR <0.7 12/28/2014    Lab Results  Component Value Date   TSH 2.16  04/03/2015   Lab Results  Component Value Date   WBC 8.7 04/03/2015   HGB 13.3 04/03/2015   HCT 41.4 04/03/2015   MCV 97.5 04/03/2015   PLT 266.0 04/03/2015   Lab Results  Component Value Date   NA 141 04/03/2015   K 4.4 04/03/2015   CO2 30 04/03/2015   GLUCOSE 83 04/03/2015   BUN 14 04/03/2015   CREATININE 0.89 04/03/2015   BILITOT 0.4 04/03/2015   ALKPHOS 53 04/03/2015   AST 17 04/03/2015   ALT 20 04/03/2015   PROT 7.0 04/03/2015    ALBUMIN 4.5 04/03/2015   CALCIUM 9.7 04/03/2015   ANIONGAP 17* 02/24/2014   GFR 81.77 04/03/2015   Lab Results  Component Value Date   CHOL 178 04/03/2015   Lab Results  Component Value Date   HDL 49.90 04/03/2015   Lab Results  Component Value Date   LDLCALC 86 10/11/2013   Lab Results  Component Value Date   TRIG 265.0* 04/03/2015   Lab Results  Component Value Date   CHOLHDL 4 04/03/2015   Lab Results  Component Value Date   HGBA1C 6.7* 04/03/2015       Assessment & Plan:   Problem List Items Addressed This Visit    Diabetic neuropathy ([place])    hgba1c acceptable, minimize simple carbs. Increase exercise as tolerated. Continue current meds, tolerating Gabapentin      Relevant Medications   atorvastatin (LIPITOR) 20 MG tablet   RESOLVED: Diabetic neuropathy with neurologic complication (HCC)   Relevant Medications   atorvastatin (LIPITOR) 20 MG tablet   Other Relevant Orders   CBC (Completed)   TSH (Completed)   Hemoglobin A1c (Completed)   Lipid panel (Completed)   Comprehensive metabolic panel (Completed)   Dyspareunia in female    Referred to gynecology for further consideration.      Relevant Orders   CBC (Completed)   TSH (Completed)   Hemoglobin A1c (Completed)   Lipid panel (Completed)   Comprehensive metabolic panel (Completed)   Essential hypertension, benign    Well controlled, no changes to meds. Encouraged heart healthy diet such as the DASH diet and exercise as tolerated.       Relevant Medications   diltiazem (TIAZAC) 360 MG 24 hr capsule   atorvastatin (LIPITOR) 20 MG tablet   Other Relevant Orders   CBC (Completed)   TSH (Completed)   Hemoglobin A1c (Completed)   Lipid panel (Completed)   Comprehensive metabolic panel (Completed)   GERD (gastroesophageal reflux disease)    Avoid offending foods, start probiotics. Do not eat large meals in late evening and consider raising head of bed.       RESOLVED: Hyperlipidemia     Encouraged heart healthy diet, increase exercise, avoid trans fats, consider a krill oil cap daily      Relevant Medications   diltiazem (TIAZAC) 360 MG 24 hr capsule   atorvastatin (LIPITOR) 20 MG tablet   Other Relevant Orders   CBC (Completed)   TSH (Completed)   Hemoglobin A1c (Completed)   Lipid panel (Completed)   Comprehensive metabolic panel (Completed)   Hyperlipidemia, mixed    Encouraged heart healthy diet, increase exercise, avoid trans fats, consider a krill oil cap daily      Relevant Medications   diltiazem (TIAZAC) 360 MG 24 hr capsule   atorvastatin (LIPITOR) 20 MG tablet   Hypothyroidism    On Levothyroxine, continue to monitor      Relevant Medications   levothyroxine (SYNTHROID, LEVOTHROID) 75  MCG tablet   Other Relevant Orders   CBC (Completed)   TSH (Completed)   Hemoglobin A1c (Completed)   Lipid panel (Completed)   Comprehensive metabolic panel (Completed)   Medicare annual wellness visit, initial - Primary    Patient denies any difficulties at home. No trouble with ADLs, depression or falls. See EMR for functional status screen and depression screen. No recent changes to vision or hearing. Is UTD with immunizations. Is UTD with screening. Discussed Advanced Directives. Encouraged heart healthy diet, exercise as tolerated and adequate sleep. See patient's problem list for health risk factors to monitor. See AVS for preventative healthcare recommendation schedule. Labs reviewed See Care Team function for list of other providers. Colonoscopy in 2016 with Dr Collene Mares repeat in 5 years MGM and Pap normal in 2016      Osteopenia    Recommend calcium intake of 1200 to 1500 mg daily, divided into roughly 3 doses. Best source is the diet and a single dairy serving is about 500 mg, a supplement of calcium citrate once or twice daily to balance diet is fine if not getting enough in diet. Also need Vitamin D 2000 IU caps, 1 cap daily if not already taking vitamin D.  Also recommend weight baring exercise on hips and upper body to keep bones strong      Relevant Orders   Vitamin D (25 hydroxy) (Completed)   Vaginal dryness, menopausal   Relevant Orders   CBC (Completed)   TSH (Completed)   Hemoglobin A1c (Completed)   Lipid panel (Completed)   Comprehensive metabolic panel (Completed)   Ambulatory referral to Gynecology    Other Visit Diagnoses    Vaginitis and vulvovaginitis        Relevant Orders    Ambulatory referral to Gynecology    Dysuria        Relevant Orders    Urinalysis (Completed)    Urine cytology ancillary only (Completed)    Ambulatory referral to Gynecology       I am having Ms. Hearne maintain her calcium-vitamin D, multivitamin with minerals, fish oil-omega-3 fatty acids, vitamin E, loteprednol, NASONEX, Ciclopirox, esomeprazole, metoprolol succinate, cycloSPORINE, polyvinyl alcohol-povidone, levocetirizine, glucose blood, metFORMIN, gabapentin, AVAPRO, Vitamin D, spironolactone, diltiazem, atorvastatin, and levothyroxine.  Meds ordered this encounter  Medications  . diltiazem (TIAZAC) 360 MG 24 hr capsule    Sig: Take 1 capsule (360 mg total) by mouth every morning.    Dispense:  90 capsule    Refill:  3  . atorvastatin (LIPITOR) 20 MG tablet    Sig: Take 1 tablet (20 mg total) by mouth at bedtime.    Dispense:  90 tablet    Refill:  2  . levothyroxine (SYNTHROID, LEVOTHROID) 75 MCG tablet    Sig: Take one tablet Monday through Friday and one half tablet Saturday and Sunday    Dispense:  90 tablet    Refill:  1     Victoria Walker, Erline Levine, MD

## 2015-04-07 NOTE — Assessment & Plan Note (Signed)
Recommend calcium intake of 1200 to 1500 mg daily, divided into roughly 3 doses. Best source is the diet and a single dairy serving is about 500 mg, a supplement of calcium citrate once or twice daily to balance diet is fine if not getting enough in diet. Also need Vitamin D 2000 IU caps, 1 cap daily if not already taking vitamin D. Also recommend weight baring exercise on hips and upper body to keep bones strong 

## 2015-04-07 NOTE — Assessment & Plan Note (Signed)
On Levothyroxine, continue to monitor 

## 2015-04-11 ENCOUNTER — Telehealth: Payer: Self-pay | Admitting: Family Medicine

## 2015-04-11 ENCOUNTER — Other Ambulatory Visit: Payer: Self-pay | Admitting: Allergy

## 2015-04-11 ENCOUNTER — Telehealth: Payer: Self-pay

## 2015-04-11 NOTE — Telephone Encounter (Signed)
Pt dropped off document for Dr. Charlett Blake to see ([place])

## 2015-04-11 NOTE — Telephone Encounter (Signed)
Relation to [id number] Call back number:04/03/2015  Pharmacy: [id number] - [place], [place]  Reason for call:  Patient stated at last visit she mentioned she had a "vaginal itch" and Dr. Charlett Blake informed her she would give her flagyl, patient checking on the status

## 2015-04-11 NOTE — Telephone Encounter (Signed)
Patient agreed to return for OV. Appointment scheduled with Elby Beck for Friday.

## 2015-04-11 NOTE — Telephone Encounter (Signed)
Reviewing for Dr. B who is out of office. Read through the note regarding complaints. Vaginal testing was negative and Dr. Charlett Blake noted she was setting her up with GYN. Since testing was negative she would need repeat evaluation before we could give medication.

## 2015-04-13 ENCOUNTER — Ambulatory Visit: Payer: Medicare Other | Admitting: Family

## 2015-04-18 NOTE — Telephone Encounter (Signed)
Paperwork received and placed in folder for Dr. Charlett Blake upon her return to office 04/26/15, please advise patient/SLS Thanks.

## 2015-04-18 NOTE — Telephone Encounter (Signed)
Have you seen this ?

## 2015-04-18 NOTE — Telephone Encounter (Signed)
Pt was called and informed the below, pt understood and ok with it.

## 2015-04-25 ENCOUNTER — Telehealth: Payer: Self-pay | Admitting: *Deleted

## 2015-04-25 ENCOUNTER — Ambulatory Visit (INDEPENDENT_AMBULATORY_CARE_PROVIDER_SITE_OTHER): Payer: Medicare HMO | Admitting: Family Medicine

## 2015-04-25 ENCOUNTER — Other Ambulatory Visit (HOSPITAL_COMMUNITY)
Admission: RE | Admit: 2015-04-25 | Discharge: 2015-04-25 | Disposition: A | Discharge status: Home / Self Care | Payer: Medicare HMO | Source: Ambulatory Visit | Attending: Family Medicine | Admitting: Family Medicine

## 2015-04-25 ENCOUNTER — Encounter: Payer: Self-pay | Admitting: Family Medicine

## 2015-04-25 VITALS — BP 109/69 | HR 78 | Temp 97.6°F | Resp 16 | Ht 65.0 in | Wt 177.4 lb

## 2015-04-25 DIAGNOSIS — M545 Low back pain, unspecified: Secondary | ICD-10-CM

## 2015-04-25 DIAGNOSIS — R300 Dysuria: Secondary | ICD-10-CM | POA: Diagnosis not present

## 2015-04-25 DIAGNOSIS — M5431 Sciatica, right side: Secondary | ICD-10-CM

## 2015-04-25 DIAGNOSIS — N760 Acute vaginitis: Secondary | ICD-10-CM | POA: Insufficient documentation

## 2015-04-25 HISTORY — DX: Sciatica, right side: M54.31

## 2015-04-25 LAB — POCT URINALYSIS DIPSTICK
Bilirubin, UA: NEGATIVE
Blood, UA: NEGATIVE
Glucose, UA: NEGATIVE
Ketones, UA: NEGATIVE
Leukocytes, UA: NEGATIVE
Nitrite, UA: NEGATIVE
Protein, UA: NEGATIVE
Spec Grav, UA: 1.015
Urobilinogen, UA: 2
pH, UA: 6

## 2015-04-25 MED ORDER — MICONAZOLE NITRATE 2 % VA CREA: TOPICAL_CREAM | VAGINAL | Status: DC

## 2015-04-25 MED ORDER — FLUCONAZOLE 150 MG PO TABS: 150.0000 mg | ORAL_TABLET | Freq: Once | ORAL | Status: DC

## 2015-04-25 NOTE — Progress Notes (Signed)
Pre visit review using our clinic review tool, if applicable. No additional management support is needed unless otherwise documented below in the visit note. 

## 2015-04-25 NOTE — Patient Instructions (Signed)
Follow up as needed Apply the Miconazole cream to the labia twice daily to help w/ the itching (you can throw the applicator away) Take the Diflucan- only 1 dose Drink plenty of fluids Tylenol and heating pad for the low back pain Call with any questions or concerns Hang in there! Happy New Year!!!

## 2015-04-25 NOTE — Telephone Encounter (Signed)
POA paperwork received from patient; forwarded to provider/SLS

## 2015-04-25 NOTE — Progress Notes (Signed)
   Subjective:    Patient ID: Victoria Walker, female    DOB: 1950-06-23, 65 y.o.   MRN: [id number]  HPI Vaginal burning- sxs started before Christmas, + itching, burning w/ urination.  Scant discharge.  Pt has burning even w/o urination.  Attempted to self tx w/ leftover Metrogel which she worries may have caused irritation.  Low back pain- no pain w/ walking.  Pain is bandlike across lumbar spine and radiates into sacrum.  Pain improved w/ 2 tylenol.  Pain started after pt cleaned for the holidays.  No radiation of pain into leg.  Pain is better than when it started 1 week ago.  Review of Systems For ROS see HPI     Objective:   Physical Exam  Constitutional: She is oriented to person, place, and time. She appears well-developed and well-nourished. No distress.  HENT:  Head: Normocephalic and atraumatic.  Abdominal: Soft. Bowel sounds are normal. She exhibits no distension. There is no tenderness (no TTP over bladder, no CVA tenderness).  Genitourinary:  Deferred at pt's request  Musculoskeletal: She exhibits tenderness (TTP over lumbar paraspinal muscles, no TTP over spine).  Neurological: She is alert and oriented to person, place, and time. Coordination normal.  Skin: Skin is warm and dry.  Vitals reviewed.         Assessment & Plan:

## 2015-04-26 NOTE — Assessment & Plan Note (Signed)
New.  This appears to be muscular as pt has no bony tenderness but does have mild TTP over lumbar paraspinal muscles.  Suspect this is due to pt recently waxing her hardwood floors.  Pt to continue tylenol prn as this has provided adequate relief.  Reviewed supportive care and red flags that should prompt return.  Pt expressed understanding and is in agreement w/ plan.

## 2015-04-26 NOTE — Assessment & Plan Note (Signed)
New.  Pt's sxs are more consistent w/ yeast irritation than UTI.  UA unremarkable.  Pt declined GU exam today.  Start topical Miconazole and oral Diflucan x1.  Reviewed supportive care and red flags that should prompt return.  Pt expressed understanding and is in agreement w/ plan.

## 2015-04-27 NOTE — Telephone Encounter (Signed)
Signed by Dr. Charlett Blake and forwarded to [facility] to scan in pt's chart. JG//CMA

## 2015-05-01 LAB — URINE CYTOLOGY ANCILLARY ONLY
Bacterial vaginitis: NEGATIVE
Candida vaginitis: NEGATIVE

## 2015-05-03 ENCOUNTER — Telehealth: Payer: Self-pay | Admitting: Family Medicine

## 2015-05-03 NOTE — Telephone Encounter (Signed)
Caller name: Tinamarie Relation to pt: self Call back number: [phone] or [phone] Pharmacy: [facility]  Reason for call: Pt came in office needing a new rx for esomeprazole (Lucama) 40 MG capsule, (for February) but pt states that needs provider to call insurance at [phone] to have a medical precertification and to ask if pt can get the price dropped on the rx. Pt would like rx to be sent to [facility] off a [address]. Please advise.

## 2015-05-04 NOTE — Telephone Encounter (Signed)
Called the patient informed to contact her pharmacy to have them fax over to our office PA for this medication once received can proceed with PA for Nexium

## 2015-05-04 NOTE — Telephone Encounter (Signed)
All Prior Authorization request Must come from the Pharmacy now with the Needed Insurance information, as Insurance has changed so much after the new year. Please have patient call pharmacy and request the PA information to be faxed to us/SLS Thanks.

## 2015-05-07 NOTE — Telephone Encounter (Signed)
Received PA Approval for Nexium valid 03/3015 through 04/20/16; faxed to pharmacy/SLS

## 2015-05-07 NOTE — Telephone Encounter (Signed)
PA initiated, awaiting determination. JG//CMA  

## 2015-05-10 ENCOUNTER — Other Ambulatory Visit: Payer: Self-pay | Admitting: Allergy

## 2015-05-10 DIAGNOSIS — M1711 Unilateral primary osteoarthritis, right knee: Secondary | ICD-10-CM | POA: Diagnosis not present

## 2015-05-10 MED ORDER — LEVOCETIRIZINE DIHYDROCHLORIDE 5 MG PO TABS: 5.0000 mg | ORAL_TABLET | Freq: Every evening | ORAL | Status: DC

## 2015-05-15 ENCOUNTER — Telehealth: Payer: Self-pay | Admitting: *Deleted

## 2015-05-15 NOTE — Telephone Encounter (Signed)
Received Surgical Clearance forms from [facility] for patient to have appointment prior to scheduled surgical [Right Total Knee Replacement] date TBD; forwarded to provider/SLS 01/24 Please call patient and schedule 30-minute Surgical Clearance appointment/SLS Thanks.

## 2015-05-15 NOTE — Telephone Encounter (Signed)
Pt is already scheduled for 06/26/15 - nothing sooner available

## 2015-05-15 NOTE — Telephone Encounter (Addendum)
Noted; forwarded to provider's CMA [Robin]/SLS

## 2015-05-21 ENCOUNTER — Ambulatory Visit (INDEPENDENT_AMBULATORY_CARE_PROVIDER_SITE_OTHER): Payer: Medicare HMO | Admitting: Obstetrics & Gynecology

## 2015-05-21 ENCOUNTER — Encounter: Payer: Self-pay | Admitting: Obstetrics & Gynecology

## 2015-05-21 VITALS — Ht 65.0 in | Wt 181.0 lb

## 2015-05-21 DIAGNOSIS — Z90710 Acquired absence of both cervix and uterus: Secondary | ICD-10-CM

## 2015-05-21 DIAGNOSIS — N905 Atrophy of vulva: Secondary | ICD-10-CM

## 2015-05-21 DIAGNOSIS — Z01419 Encounter for gynecological examination (general) (routine) without abnormal findings: Secondary | ICD-10-CM | POA: Diagnosis not present

## 2015-05-21 DIAGNOSIS — N898 Other specified noninflammatory disorders of vagina: Secondary | ICD-10-CM | POA: Diagnosis not present

## 2015-05-21 DIAGNOSIS — Z1239 Encounter for other screening for malignant neoplasm of breast: Secondary | ICD-10-CM | POA: Diagnosis not present

## 2015-05-21 NOTE — Progress Notes (Signed)
Patient ID: Victoria Walker, female   DOB: 25-Jul-1950, 65 y.o.   MRN: [id number] Subjective:     Victoria Walker is a 65 y.o. female here for a routine exam.  Current complaints: pt with a h/o vulvar itching.  I last saw her in Jan 2015.  She reports that the itching has resolved. She c/o occ sorness in her vagina.  Mainly on the 'outside.'  Pt was on Premarin cream but, she could not tolerate it. She reports pain with it.  She felt like it caused her to have a 'bacterial infection'  Pt reports a chronic odor. Pt reports that she is scheduled to have a knee replacement in March.   Gynecologic History No LMP recorded. Patient has had a hysterectomy. Contraception: post menopausal status Last Pap: prior to 1986 .  Last mammogram: 09/2014. Results were: normal.  H/o breast cancer 2007  Obstetric History OB History  Gravida Para Term Preterm AB SAB TAB Ectopic Multiple Living  3 3 3       3     # Outcome Date GA Lbr Len/2nd Weight Sex Delivery Anes PTL Lv  3 Term 40    F Vag-Spont     2 Term 1973    F Vag-Spont     1 Term 1969    M Vag-Spont          The following portions of the patient's history were reviewed and updated as appropriate: allergies, current medications, past family history, past medical history, past social history, past surgical history and problem list.  Review of Systems Pertinent items are noted in HPI.    Objective:  Ht 5\' 5"  (1.651 m)  Wt 181 lb (82.101 kg)  BMI 30.12 kg/m2 General Appearance:    Alert, cooperative, no distress, appears stated age  Head:    Normocephalic, without obvious abnormality, atraumatic  Eyes:    conjunctiva/corneas clear, EOM's intact, both eyes  Ears:    Normal external ear canals, both ears  Nose:   Nares normal, septum midline, mucosa normal, no drainage    or sinus tenderness  Throat:   Lips, mucosa, and tongue normal; teeth and gums normal  Neck:   Supple, symmetrical, trachea midline, no adenopathy;    thyroid:  no  enlargement/tenderness/nodules. Well healed transverse incision  Back:     Symmetric, no curvature, ROM normal, no CVA tenderness  Lungs:     Clear to auscultation bilaterally, respirations unlabored  Chest Wall:    No tenderness or deformity   Heart:    Regular rate and rhythm, S1 and S2 normal, no murmur, rub   or gallop  Breast Exam:    Right breast  S/p mastectomy.  Well healed incision.  NO masses noted.  On the left side there is also no tenderness, masses, or nipple abnormality  Abdomen:     Soft, non-tender, bowel sounds active all four quadrants,    no masses, no organomegaly  Genitalia:    Normal female without lesion, discharge or tenderness.  There is lubrication but, atrophy is noted. Uterus and cervix surgically absent.     Extremities:   Extremities normal, atraumatic, no cyanosis or edema  Pulses:   2+ and symmetric all extremities  Skin:   Skin color, texture, turgor normal, no rashes or lesions     Assessment:    Healthy female exam.   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    Wet smear with Affirm

## 2015-05-22 LAB — WET PREP BY MOLECULAR PROBE
Candida species: NEGATIVE
Gardnerella vaginalis: NEGATIVE
Trichomonas vaginosis: NEGATIVE

## 2015-05-24 ENCOUNTER — Ambulatory Visit (HOSPITAL_BASED_OUTPATIENT_CLINIC_OR_DEPARTMENT_OTHER)
Admission: RE | Admit: 2015-05-24 | Discharge: 2015-05-24 | Disposition: A | Discharge status: Home / Self Care | Payer: Medicare HMO | Source: Ambulatory Visit | Attending: Medical | Admitting: Medical

## 2015-05-24 ENCOUNTER — Encounter: Payer: Self-pay | Admitting: Medical

## 2015-05-24 ENCOUNTER — Ambulatory Visit (INDEPENDENT_AMBULATORY_CARE_PROVIDER_SITE_OTHER): Payer: Medicare HMO | Admitting: Medical

## 2015-05-24 VITALS — BP 128/78 | HR 88 | Temp 101.3°F | Ht 65.0 in | Wt 178.0 lb

## 2015-05-24 DIAGNOSIS — R509 Fever, unspecified: Secondary | ICD-10-CM

## 2015-05-24 DIAGNOSIS — J0100 Acute maxillary sinusitis, unspecified: Secondary | ICD-10-CM | POA: Diagnosis not present

## 2015-05-24 DIAGNOSIS — J209 Acute bronchitis, unspecified: Secondary | ICD-10-CM

## 2015-05-24 DIAGNOSIS — M940 Chondrocostal junction syndrome [Tietze]: Secondary | ICD-10-CM | POA: Diagnosis not present

## 2015-05-24 DIAGNOSIS — R05 Cough: Secondary | ICD-10-CM | POA: Diagnosis not present

## 2015-05-24 LAB — CBC WITH DIFFERENTIAL/PLATELET
Basophils Absolute: 0 10*3/uL (ref 0.0–0.1)
Basophils Relative: 0.5 % (ref 0.0–3.0)
Eosinophils Absolute: 0 10*3/uL (ref 0.0–0.7)
Eosinophils Relative: 0.6 % (ref 0.0–5.0)
HCT: 40.2 % (ref 36.0–46.0)
Hemoglobin: 12.8 g/dL (ref 12.0–15.0)
Lymphocytes Relative: 15.2 % (ref 12.0–46.0)
Lymphs Abs: 1.2 10*3/uL (ref 0.7–4.0)
MCHC: 31.9 g/dL (ref 30.0–36.0)
MCV: 97.4 fl (ref 78.0–100.0)
Monocytes Absolute: 0.9 10*3/uL (ref 0.1–1.0)
Monocytes Relative: 11.5 % (ref 3.0–12.0)
Neutro Abs: 5.7 10*3/uL (ref 1.4–7.7)
Neutrophils Relative %: 72.2 % (ref 43.0–77.0)
Platelets: 220 10*3/uL (ref 150.0–400.0)
RBC: 4.12 Mil/uL (ref 3.87–5.11)
RDW: 13.6 % (ref 11.5–15.5)
WBC: 7.9 10*3/uL (ref 4.0–10.5)

## 2015-05-24 MED ORDER — BENZONATATE 100 MG PO CAPS: 100.0000 mg | ORAL_CAPSULE | Freq: Three times a day (TID) | ORAL | Status: DC | PRN

## 2015-05-24 MED ORDER — FLUTICASONE PROPIONATE 50 MCG/ACT NA SUSP: 2.0000 | Freq: Every day | NASAL | Status: DC

## 2015-05-24 MED ORDER — AZITHROMYCIN 250 MG PO TABS: ORAL_TABLET | ORAL | Status: DC

## 2015-05-24 NOTE — Patient Instructions (Addendum)
You appear to have a sinus infection and bronchitis. I am prescribing azithromycin  antibiotic for the infection. To help with the nasal congestion I prescribed nasal steroid flonase. For your associated cough, I prescribed cough medicine benzonatate  For cartilage inflammation in chest take tylenol.  If pain in chest changes/constant or other associated symptoms then ED evaluation.  Will get cbc and cxr due to fever and pt concern for pneumonia  Rest, hydrate, tylenol for fever.  Follow up in 7 days or as needed.

## 2015-05-24 NOTE — Progress Notes (Signed)
Subjective:    Patient ID: Victoria Walker, female    DOB: 04/26/1950, 65 y.o.   MRN: [id number]  HPI    Pt in with cough. This has been going on since Tuesday. This is worsening and has some productive. Pt has sharp anterior chest pain when she coughs. Chills and has fever today. No sweats.  Nasal congestion as well. Pt has sinus pressure. Pt also has some back pain shen she coughs.  Pt upper teeth have pain.  Pt states some severe cough fits. 2 am this morning she woke and she was coughing a lot.   Review of Systems  Constitutional: Positive for fever and fatigue. Negative for chills.  HENT: Positive for sinus pressure and sore throat. Negative for congestion, drooling, ear pain, postnasal drip, rhinorrhea and sneezing.   Respiratory: Positive for cough. Negative for choking, chest tightness, shortness of breath and wheezing.   Cardiovascular: Negative for chest pain and palpitations.       Pain costochondral junction on coughing. Transient and pain reproducible.  Gastrointestinal: Negative for abdominal pain.  Musculoskeletal: Negative for myalgias.       Transient back pain when she coughs as well.  Skin: Positive for rash.  Neurological: Negative for dizziness and headaches.  Hematological: Negative for adenopathy. Does not bruise/bleed easily.  Psychiatric/Behavioral: Negative for behavioral problems and confusion.    Past Medical History  Diagnosis Date  . Hypertension   . Neuropathy ([facility])     FEET  . High cholesterol   . Hypothyroidism   . GERD (gastroesophageal reflux disease)   . Cancer [facility])     breast cancer  . Dysrhythmia     HX OF FAST HEART RATE AND PALPITATIONS - METOPROLOL HAS HELPED  . Multinodular thyroid     PT HAVING TROUBLE SWALLOWING  . Diabetes mellitus without complication (HCC)     type 2 - ORAL MEDICATION - NO INSULIN  . H/O hiatal hernia   . Arthritis     knees, back; PT PLANS RIGHT TOTAL KNEE SURGERY IN THE FUTURE  . Claustrophobia     . H/O iritis     LEFT EYE - STATES HER EYE BECOMES RED AND VERY SENSITIVE TO LIGHT WHEN FLARE UP OF IRITIS  . History of chickenpox 09/26/2014  . History of measles   . History of shingles   . History of MRSA infection   . Medicare annual wellness visit, initial 04/07/2015    Social History   Social History  . Marital Status: Married    Spouse Name: N/A  . Number of Children: 3  . Years of Education: N/A   Occupational History  .      Retired   Social History Main Topics  . Smoking status: Never Smoker   . Smokeless tobacco: Never Used  . Alcohol Use: No  . Drug Use: No  . Sexual Activity: Yes    Birth Control/ Protection: Surgical     Comment: lives with husband, retired from various jobs, diabetic diet   Other Topics Concern  . Not on file   Social History Narrative    Past Surgical History  Procedure Laterality Date  . Thyroid operation  1976    removed tumor  . Abdominal hysterectomy  1988  . Mastectomy  2007    right  . Plantar fascia surgery Left   . Tummy tuck  1986  . Hernia repair  1986  . Cardiac catheterization  1999  . Anterior and posterior repair  05/11/2012    Procedure: ANTERIOR (CYSTOCELE) AND POSTERIOR REPAIR (RECTOCELE);  Surgeon: Reece Packer, MD;  Location: [facility];  Service: Urology;;  with graft  . Vaginal prolapse repair  05/11/2012    Procedure: VAGINAL VAULT SUSPENSION;  Surgeon: Reece Packer, MD;  Location: [facility];  Service: Urology;;  . Cystoscopy with urethral dilatation  05/11/2012    Procedure: CYSTOSCOPY WITH URETHRAL DILATATION;  Surgeon: Reece Packer, MD;  Location: [facility];  Service: Urology;;  . Thyroidectomy N/A 02/23/2014    Procedure: TOTAL THYROIDECTOMY;  Surgeon: Armandina Gemma, MD;  Location: [facility];  Service: General;  Laterality: N/A;  . Skin biopsy      vulvar    Family History  Problem Relation Age of Onset  . Hypertension Mother   . Heart disease Mother     pacer, CHF  . Hyperlipidemia Mother   .  Kidney disease Mother     1 kidney due to stone  . Kidney Stones Mother   . Hypertension Father   . Cancer Father     lung, psa  . Diabetes Father   . Alcohol abuse Father   . Diabetes Sister   . Cancer Sister   . Hypertension Sister   . Diabetes Brother   . Hypertension Brother   . Hyperlipidemia Brother   . Kidney disease Brother     dialysis  . Stroke Brother   . Hypertension Maternal Aunt   . Hypertension Maternal Uncle   . Hypertension Paternal Aunt   . Cancer Paternal Aunt   . Hypertension Paternal Uncle   . Hypertension Maternal Grandmother   . Heart disease Maternal Grandmother   . Stroke Maternal Grandmother   . Hypertension Maternal Grandfather   . Hypertension Daughter   . Arthritis Son   . Hyperlipidemia Sister   . Hypertension Sister   . Diabetes Sister   . Thyroid disease Sister   . Cancer Sister     thyroid and breast  . Arthritis Sister     back pain  . Hypertension Sister   . Thyroid disease Sister   . Hypertension Brother   . Hyperlipidemia Brother   . Hypertension Brother   . Hyperlipidemia Brother   . Benign prostatic hyperplasia Brother   . Hypertension Daughter   . Hyperlipidemia Daughter   . Diabetes Daughter     Allergies  Allergen Reactions  . Penicillins Itching  . Sulfa Antibiotics Itching  . Codeine Anxiety    Jittery   . Vicodin [Hydrocodone-Acetaminophen]     ITCHING     Current Outpatient Prescriptions on File Prior to Visit  Medication Sig Dispense Refill  . atorvastatin (LIPITOR) 20 MG tablet Take 1 tablet (20 mg total) by mouth at bedtime. 90 tablet 2  . AVAPRO 150 MG tablet Take 0.5-1 tablets (75-150 mg total) by mouth daily. 30 tablet 5  . calcium-vitamin D (OSCAL WITH D) 250-125 MG-UNIT per tablet Take 1 tablet by mouth 2 (two) times daily.     . Cholecalciferol (VITAMIN D) 2000 UNITS CAPS Take 1 capsule by mouth daily.    . Ciclopirox 1 % shampoo Apply 1 each topically every 7 (seven) days.     . cycloSPORINE  (RESTASIS) 0.05 % ophthalmic emulsion Place 1 drop into both eyes 2 (two) times daily.    Marland Kitchen diltiazem (TIAZAC) 360 MG 24 hr capsule Take 1 capsule (360 mg total) by mouth every morning. 90 capsule 3  . esomeprazole (NEXIUM) 40 MG capsule Take 40  mg by mouth every morning.    . fish oil-omega-3 fatty acids 1000 MG capsule Take 1 g by mouth every evening.     . gabapentin (NEURONTIN) 300 MG capsule Take 1 capsule (300 mg total) by mouth 3 (three) times daily. 270 capsule 1  . glucose blood (ONE TOUCH TEST STRIPS) test strip Use as directed twice daily to check blood sugar. DX code E11.9 300 each 1  . levocetirizine (XYZAL) 5 MG tablet Take 1 tablet (5 mg total) by mouth every evening. 90 tablet 0  . levothyroxine (SYNTHROID, LEVOTHROID) 75 MCG tablet Take one tablet Monday through Friday and one half tablet Saturday and Sunday 90 tablet 1  . loteprednol (LOTEMAX) 0.5 % ophthalmic suspension Place 1 drop into the left eye daily as needed ("arthiritis of the eye").     . metFORMIN (GLUCOPHAGE-XR) 750 MG 24 hr tablet TAKE 1 TABLET IN AM AND 1 TABLET AT LUNCH 180 tablet 1  . metoprolol succinate (TOPROL-XL) 25 MG 24 hr tablet Take 25 mg by mouth 2 (two) times daily.     . miconazole (MONISTAT 7) 2 % vaginal cream Apply externally twice daily until burning and itching improves 45 g 0  . Multiple Vitamins-Minerals (MULTIVITAMIN WITH MINERALS) tablet Take 1 tablet by mouth daily with lunch.     Marland Kitchen NASONEX 50 MCG/ACT nasal spray Place 2 sprays into the nose daily as needed.     . polyvinyl alcohol-povidone (REFRESH) 1.4-0.6 % ophthalmic solution Place 1 drop into both eyes 2 (two) times daily.    Marland Kitchen spironolactone (ALDACTONE) 25 MG tablet TAKE 1 TABLET BY MOUTH TWICE A DAY 180 tablet 1  . vitamin E 1000 UNIT capsule Take 1,000 Units by mouth daily with lunch.      No current facility-administered medications on file prior to visit.    BP 128/78 mmHg  Pulse 88  Temp(Src) 101.3 F (38.5 C) (Oral)  Ht 5'  5" (1.651 m)  Wt 178 lb (80.74 kg)  BMI 29.62 kg/m2  SpO2 98%       Objective:   Physical Exam  General  Mental Status - Alert. General Appearance - Well groomed. Not in acute distress.  Skin Rashes- No Rashes.  HEENT Head- Normal. Ear Auditory Canal - Left- Normal. Right - Normal.Tympanic Membrane- Left- Normal. Right- Normal. Eye Sclera/Conjunctiva- Left- Normal. Right- Normal. Nose & Sinuses Nasal Mucosa- Left-  Boggy and Congested. Right-  Boggy and  Congested.Bilateral maxillary and frontal sinus pressure. Mouth & Throat Lips: Upper Lip- Normal: no dryness, cracking, pallor, cyanosis, or vesicular eruption. Lower Lip-Normal: no dryness, cracking, pallor, cyanosis or vesicular eruption. Buccal Mucosa- Bilateral- No Aphthous ulcers. Oropharynx- No Discharge but  Erythema. Tonsils: Characteristics- Bilateral- Erythema, Congestion. Size/Enlargement- Bilateral- No enlargement. Discharge- bilateral-None.  Neck Neck- Supple. No Masses.   Chest and Lung Exam Auscultation: Breath Sounds:-Clear even and unlabored. Faint upper lobe rhonchi  Cardiovascular Auscultation:Rythm- Regular, rate and rhythm. Murmurs & Other Heart Sounds:Ausculatation of the heart reveal- No Murmurs.  Lymphatic Head & Neck General Head & Neck Lymphatics: Bilateral: Description- No Localized lymphadenopathy.  Anterior thorax- costochondral junction tenderness to palpation.       Assessment & Plan:  You appear to have a sinus infection and bronchitis. I am prescribing azithromycin  antibiotic for the infection. To help with the nasal congestion I prescribed nasal steroid flonase. For your associated cough, I prescribed cough medicine benzonatate  For cartilage inflammation in chest take tylenol.  If pain in chest changes/constant or  other associated symptoms then ED evaluation.  Will get cbc and cxr due to fever and pt concern for pneumonia  Rest, hydrate, tylenol for fever.  Follow up  in 7 days or as needed.

## 2015-05-24 NOTE — Progress Notes (Signed)
Pre visit review using our clinic review tool, if applicable. No additional management support is needed unless otherwise documented below in the visit note. 

## 2015-05-28 ENCOUNTER — Telehealth: Payer: Self-pay | Admitting: Family Medicine

## 2015-05-28 MED ORDER — ESOMEPRAZOLE MAGNESIUM 40 MG PO CPDR: 40.0000 mg | DELAYED_RELEASE_CAPSULE | ORAL | Status: DC

## 2015-05-28 NOTE — Telephone Encounter (Signed)
Caller name: Yujin   Relationship to patient: Self   Can be reached: [phone]  Pharmacy:  [facility] on [address].   Reason for call: pt is requesting a new Rx for Maize.  She says that the pharmacist is suggesting a 90 day supply.

## 2015-05-28 NOTE — Telephone Encounter (Signed)
Refill done and patient informed 

## 2015-05-30 ENCOUNTER — Encounter: Payer: Self-pay | Admitting: Medical

## 2015-05-30 ENCOUNTER — Ambulatory Visit (INDEPENDENT_AMBULATORY_CARE_PROVIDER_SITE_OTHER): Payer: Medicare HMO | Admitting: Medical

## 2015-05-30 VITALS — BP 125/73 | HR 77 | Temp 98.5°F | Ht 65.0 in | Wt 177.0 lb

## 2015-05-30 DIAGNOSIS — J309 Allergic rhinitis, unspecified: Secondary | ICD-10-CM

## 2015-05-30 DIAGNOSIS — R05 Cough: Secondary | ICD-10-CM | POA: Diagnosis not present

## 2015-05-30 DIAGNOSIS — R059 Cough, unspecified: Secondary | ICD-10-CM

## 2015-05-30 MED ORDER — AZELASTINE HCL 0.1 % NA SOLN: 2.0000 | Freq: Two times a day (BID) | NASAL | Status: DC

## 2015-05-30 NOTE — Progress Notes (Signed)
Subjective:    Patient ID: [name], female    DOB: 1950/08/22, 65 y.o.   MRN: [id number]  HPI  Pt in with cough. This has been going on since Tuesday. This is worsening and has some productive. Pt has sharp anterior chest pain when she coughs. Chills and has fever today. No sweats.  Nasal congestion as well. Pt has sinus pressure. Pt also has some back pain shen she coughs.  Pt upper teeth have pain.  Pt states some severe cough fits. 2 am this morning she woke and she was coughing a lot.   Pt in for follow up.  The above is from prior visit. Overall better. Cough was severe and kept her up. Now cough is intermittent and residual.  Sinus pressure is gone now. No further teen pain. No fever, no chills or sweats.  Pt cbc and cxr were negative.  Anterior chest wall pain on coughing has resolved.  Pt does have history of allergies.  Pt has xyzal and nasonex.    Review of Systems  Constitutional: Negative for fever, chills and fatigue.  HENT: Positive for congestion, postnasal drip and sneezing. Negative for ear pain, nosebleeds, sinus pressure, sore throat and trouble swallowing.   Respiratory: Positive for cough. Negative for shortness of breath and wheezing.   Cardiovascular: Negative for chest pain and palpitations.  Musculoskeletal: Negative for back pain.  Skin: Negative for rash.  Neurological: Negative for dizziness, weakness, numbness and headaches.  Hematological: Negative for adenopathy. Does not bruise/bleed easily.  Psychiatric/Behavioral: Negative for behavioral problems and confusion.     Past Medical History  Diagnosis Date  . Hypertension   . Neuropathy ([place])     FEET  . High cholesterol   . Hypothyroidism   . GERD (gastroesophageal reflux disease)   . Cancer [facility])     breast cancer  . Dysrhythmia     HX OF FAST HEART RATE AND PALPITATIONS - METOPROLOL HAS HELPED  . Multinodular thyroid     PT HAVING TROUBLE SWALLOWING  . Diabetes mellitus  without complication ([facility])     type 2 - ORAL MEDICATION - NO INSULIN  . H/O hiatal hernia   . Arthritis     knees, back; PT PLANS RIGHT TOTAL KNEE SURGERY IN THE FUTURE  . Claustrophobia   . H/O iritis     LEFT EYE - STATES HER EYE BECOMES RED AND VERY SENSITIVE TO LIGHT WHEN FLARE UP OF IRITIS  . History of chickenpox 09/26/2014  . History of measles   . History of shingles   . History of MRSA infection   . Medicare annual wellness visit, initial 04/07/2015    Social History   Social History  . Marital Status: Married    Spouse Name: N/A  . Number of Children: 3  . Years of Education: N/A   Occupational History  .      Retired   Social History Main Topics  . Smoking status: Never Smoker   . Smokeless tobacco: Never Used  . Alcohol Use: No  . Drug Use: No  . Sexual Activity: Yes    Birth Control/ Protection: Surgical     Comment: lives with husband, retired from various jobs, diabetic diet   Other Topics Concern  . Not on file   Social History Narrative    Past Surgical History  Procedure Laterality Date  . Thyroid operation  1976    removed tumor  . Abdominal hysterectomy  1988  . Mastectomy  2007    right  . Plantar fascia surgery Left   . Tummy tuck  1986  . Hernia repair  1986  . Cardiac catheterization  1999  . Anterior and posterior repair  05/11/2012    Procedure: ANTERIOR (CYSTOCELE) AND POSTERIOR REPAIR (RECTOCELE);  Surgeon: Reece Packer, MD;  Location: [facility];  Service: Urology;;  with graft  . Vaginal prolapse repair  05/11/2012    Procedure: VAGINAL VAULT SUSPENSION;  Surgeon: Reece Packer, MD;  Location: [facility];  Service: Urology;;  . Cystoscopy with urethral dilatation  05/11/2012    Procedure: CYSTOSCOPY WITH URETHRAL DILATATION;  Surgeon: Reece Packer, MD;  Location: [facility];  Service: Urology;;  . Thyroidectomy N/A 02/23/2014    Procedure: TOTAL THYROIDECTOMY;  Surgeon: Armandina Gemma, MD;  Location: [facility];  Service: General;   Laterality: N/A;  . Skin biopsy      vulvar    Family History  Problem Relation Age of Onset  . Hypertension Mother   . Heart disease Mother     pacer, CHF  . Hyperlipidemia Mother   . Kidney disease Mother     1 kidney due to stone  . Kidney Stones Mother   . Hypertension Father   . Cancer Father     lung, psa  . Diabetes Father   . Alcohol abuse Father   . Diabetes Sister   . Cancer Sister   . Hypertension Sister   . Diabetes Brother   . Hypertension Brother   . Hyperlipidemia Brother   . Kidney disease Brother     dialysis  . Stroke Brother   . Hypertension Maternal Aunt   . Hypertension Maternal Uncle   . Hypertension Paternal Aunt   . Cancer Paternal Aunt   . Hypertension Paternal Uncle   . Hypertension Maternal Grandmother   . Heart disease Maternal Grandmother   . Stroke Maternal Grandmother   . Hypertension Maternal Grandfather   . Hypertension Daughter   . Arthritis Son   . Hyperlipidemia Sister   . Hypertension Sister   . Diabetes Sister   . Thyroid disease Sister   . Cancer Sister     thyroid and breast  . Arthritis Sister     back pain  . Hypertension Sister   . Thyroid disease Sister   . Hypertension Brother   . Hyperlipidemia Brother   . Hypertension Brother   . Hyperlipidemia Brother   . Benign prostatic hyperplasia Brother   . Hypertension Daughter   . Hyperlipidemia Daughter   . Diabetes Daughter     Allergies  Allergen Reactions  . Penicillins Itching  . Sulfa Antibiotics Itching  . Codeine Anxiety    Jittery   . Vicodin [Hydrocodone-Acetaminophen]     ITCHING     Current Outpatient Prescriptions on File Prior to Visit  Medication Sig Dispense Refill  . atorvastatin (LIPITOR) 20 MG tablet Take 1 tablet (20 mg total) by mouth at bedtime. 90 tablet 2  . AVAPRO 150 MG tablet Take 0.5-1 tablets (75-150 mg total) by mouth daily. 30 tablet 5  . benzonatate (TESSALON) 100 MG capsule Take 1 capsule (100 mg total) by mouth 3 (three)  times daily as needed for cough. 21 capsule 0  . calcium-vitamin D (OSCAL WITH D) 250-125 MG-UNIT per tablet Take 1 tablet by mouth 2 (two) times daily.     . Cholecalciferol (VITAMIN D) 2000 UNITS CAPS Take 1 capsule by mouth daily.    . Ciclopirox 1 %  shampoo Apply 1 each topically every 7 (seven) days.     . cycloSPORINE (RESTASIS) 0.05 % ophthalmic emulsion Place 1 drop into both eyes 2 (two) times daily.    Marland Kitchen diltiazem (TIAZAC) 360 MG 24 hr capsule Take 1 capsule (360 mg total) by mouth every morning. 90 capsule 3  . esomeprazole (NEXIUM) 40 MG capsule Take 1 capsule (40 mg total) by mouth every morning. 90 capsule 1  . fish oil-omega-3 fatty acids 1000 MG capsule Take 1 g by mouth every evening.     . fluticasone (FLONASE) 50 MCG/ACT nasal spray Place 2 sprays into both nostrils daily. 16 g 1  . gabapentin (NEURONTIN) 300 MG capsule Take 1 capsule (300 mg total) by mouth 3 (three) times daily. 270 capsule 1  . glucose blood (ONE TOUCH TEST STRIPS) test strip Use as directed twice daily to check blood sugar. DX code E11.9 300 each 1  . levocetirizine (XYZAL) 5 MG tablet Take 1 tablet (5 mg total) by mouth every evening. 90 tablet 0  . levothyroxine (SYNTHROID, LEVOTHROID) 75 MCG tablet Take one tablet Monday through Friday and one half tablet Saturday and Sunday 90 tablet 1  . loteprednol (LOTEMAX) 0.5 % ophthalmic suspension Place 1 drop into the left eye daily as needed ("arthiritis of the eye").     . metFORMIN (GLUCOPHAGE-XR) 750 MG 24 hr tablet TAKE 1 TABLET IN AM AND 1 TABLET AT LUNCH 180 tablet 1  . metoprolol succinate (TOPROL-XL) 25 MG 24 hr tablet Take 25 mg by mouth 2 (two) times daily.     . miconazole (MONISTAT 7) 2 % vaginal cream Apply externally twice daily until burning and itching improves 45 g 0  . Multiple Vitamins-Minerals (MULTIVITAMIN WITH MINERALS) tablet Take 1 tablet by mouth daily with lunch.     Marland Kitchen NASONEX 50 MCG/ACT nasal spray Place 2 sprays into the nose daily as  needed.     . polyvinyl alcohol-povidone (REFRESH) 1.4-0.6 % ophthalmic solution Place 1 drop into both eyes 2 (two) times daily.    Marland Kitchen spironolactone (ALDACTONE) 25 MG tablet TAKE 1 TABLET BY MOUTH TWICE A DAY 180 tablet 1  . vitamin E 1000 UNIT capsule Take 1,000 Units by mouth daily with lunch.      No current facility-administered medications on file prior to visit.    BP 125/73 mmHg  Pulse 77  Temp(Src) 98.5 F (36.9 C) (Oral)  Ht 5\' 5"  (1.651 m)  Wt 177 lb (80.287 kg)  BMI 29.45 kg/m2  SpO2 97%        Objective:   Physical Exam  General  Mental Status - Alert. General Appearance - Well groomed. Not in acute distress.  Skin Rashes- No Rashes.  HEENT Head- Normal. Ear Auditory Canal - Left- Normal. Right - Normal.Tympanic Membrane- Left- Normal. Right- Normal. Eye Sclera/Conjunctiva- Left- Normal. Right- Normal. Nose & Sinuses Nasal Mucosa- Left-  Boggy and Congested. Right-  Boggy and  Congested.Bilateral no maxillary and no  frontal sinus pressure. Mouth & Throat Lips: Upper Lip- Normal: no dryness, cracking, pallor, cyanosis, or vesicular eruption. Lower Lip-Normal: no dryness, cracking, pallor, cyanosis or vesicular eruption. Buccal Mucosa- Bilateral- No Aphthous ulcers. Oropharynx- No Discharge or Erythema. +pnd Tonsils: Characteristics- Bilateral- No Erythema or Congestion. Size/Enlargement- Bilateral- No enlargement. Discharge- bilateral-None.  Neck Neck- Supple. No Masses.   Chest and Lung Exam Auscultation: Breath Sounds:-Clear even and unlabored.  Cardiovascular Auscultation:Rythm- Regular, rate and rhythm. Murmurs & Other Heart Sounds:Ausculatation of the heart reveal- No  Murmurs.  Lymphatic Head & Neck General Head & Neck Lymphatics: Bilateral: Description- No Localized lymphadenopathy.       Assessment & Plan:  You appear to have allergies presently and cough related to post nasal drainage.  Continue nasonex and will add astelin.  Continue xyzal.   If cough worsens can take 2 of the 100 mg benzonatate tablets.  Your sinus infection and bronchitis appears to have been resolved. Chest xrays and infectious cells were negative.  Follow up in 10-14 days or as needed

## 2015-05-30 NOTE — Progress Notes (Signed)
Pre visit review using our clinic review tool, if applicable. No additional management support is needed unless otherwise documented below in the visit note. 

## 2015-05-30 NOTE — Patient Instructions (Addendum)
You appear to have allergies presently and cough related to post nasal drainage.  Continue nasonex and will add astelin. Continue xyzal.   If cough worsens can take 2 of the 100 mg benzonatate tablets.  Your sinus infection and bronchitis appears to have been resolved. Chest xrays and infectious cells were negative.  Follow up in 10-14 days or as needed

## 2015-06-05 ENCOUNTER — Other Ambulatory Visit: Payer: Self-pay | Admitting: Family Medicine

## 2015-06-06 ENCOUNTER — Encounter: Payer: Self-pay | Admitting: Internal Medicine

## 2015-06-06 ENCOUNTER — Ambulatory Visit (INDEPENDENT_AMBULATORY_CARE_PROVIDER_SITE_OTHER): Payer: Medicare HMO | Admitting: Internal Medicine

## 2015-06-06 VITALS — BP 126/70 | HR 76 | Temp 98.6°F | Resp 16 | Ht 65.0 in | Wt 177.6 lb

## 2015-06-06 DIAGNOSIS — J310 Chronic rhinitis: Secondary | ICD-10-CM | POA: Diagnosis not present

## 2015-06-06 DIAGNOSIS — R0602 Shortness of breath: Secondary | ICD-10-CM

## 2015-06-06 MED ORDER — MOMETASONE FUROATE 50 MCG/ACT NA SUSP: 2.0000 | Freq: Every day | NASAL | Status: DC | PRN

## 2015-06-06 MED ORDER — LEVOCETIRIZINE DIHYDROCHLORIDE 5 MG PO TABS: 5.0000 mg | ORAL_TABLET | Freq: Every evening | ORAL | Status: DC

## 2015-06-06 NOTE — Progress Notes (Signed)
History of Present Illness: Victoria Walker is a 65 y.o. female presenting for follow-up.  HPI Comments: Chronic rhinitis: Symptoms are stable on Nasonex and Xyzal. When she does not use them, she has nasal pruritus  Coughing/shortness of breath: For the past several weeks, she has had symptoms. She was diagnosed with bronchitis. A chest x-ray was reportedly negative for pneumonia. She was given a Z-Pak but continues to have a cough with scant mucus. She was given Ladona Ridgel which do seem to help but she is afraid of using them because she does not want to develop tolerance.  Of note, patient is scheduled to undergo knee surgery in March 2017.   Assessment and Plan: Chronic rhinitis  Currently well controlled  Continue Nasonex 1-2 sprays each nostril daily and Xyzal 5 mg daily  Shortness of breath  Coughing, currently not well controlled  Use Tessalon Perles 3 times a day as needed-prescription already given  Let us know if not better in 2 weeks   Return in about 1 year (around 06/05/2016).  Medications ordered this encounter:  Meds ordered this encounter  Medications  . diclofenac sodium (VOLTAREN) 1 % GEL    Sig: Apply 2 g topically 4 (four) times daily. To knee if needed for pain.  Marland Kitchen levocetirizine (XYZAL) 5 MG tablet    Sig: Take 1 tablet (5 mg total) by mouth every evening.    Dispense:  90 tablet    Refill:  0  . mometasone (NASONEX) 50 MCG/ACT nasal spray    Sig: Place 2 sprays into the nose daily as needed.    Dispense:  17 g    Refill:  5    Diagnostics: Spirometry: FEV1 1.96L or 96%, FEV1/FVC  85%.  This is a normal study.  Physical Exam: BP 126/70 mmHg  Pulse 76  Temp(Src) 98.6 F (37 C) (Oral)  Resp 16  Ht 5\' 5"  (1.651 m)  Wt 177 lb 9.6 oz (80.559 kg)  BMI 29.55 kg/m2   Physical Exam  Constitutional: She appears well-developed and well-nourished. No distress.  HENT:  Right Ear: External ear normal.  Left Ear: External ear normal.  Nose:  Nose normal.  Mouth/Throat: Oropharynx is clear and moist.  Eyes: Conjunctivae are normal. Right eye exhibits no discharge. Left eye exhibits no discharge.  Cardiovascular: Normal rate, regular rhythm and normal heart sounds.   No murmur heard. Pulmonary/Chest: Effort normal and breath sounds normal. No respiratory distress. She has no wheezes. She has no rales.  Abdominal: Soft. Bowel sounds are normal.  Musculoskeletal: She exhibits no edema.  Lymphadenopathy:    She has no cervical adenopathy.  Neurological: She is alert.  Skin: No rash noted.  Vitals reviewed.   Medications: Current outpatient prescriptions:  .  atorvastatin (LIPITOR) 20 MG tablet, Take 1 tablet (20 mg total) by mouth at bedtime., Disp: 90 tablet, Rfl: 2 .  AVAPRO 150 MG tablet, Take 0.5-1 tablets (75-150 mg total) by mouth daily., Disp: 30 tablet, Rfl: 5 .  benzonatate (TESSALON) 100 MG capsule, Take 1 capsule (100 mg total) by mouth 3 (three) times daily as needed for cough., Disp: 21 capsule, Rfl: 0 .  calcium-vitamin D (OSCAL WITH D) 250-125 MG-UNIT per tablet, Take 1 tablet by mouth 2 (two) times daily. , Disp: , Rfl:  .  Cholecalciferol (VITAMIN D) 2000 UNITS CAPS, Take 1 capsule by mouth daily., Disp: , Rfl:  .  Ciclopirox 1 % shampoo, Apply 1 each topically every 7 (seven) days. , Disp: ,  Rfl:  .  cycloSPORINE (RESTASIS) 0.05 % ophthalmic emulsion, Place 1 drop into both eyes 2 (two) times daily., Disp: , Rfl:  .  diclofenac sodium (VOLTAREN) 1 % GEL, Apply 2 g topically 4 (four) times daily. To knee if needed for pain., Disp: , Rfl:  .  diltiazem (TIAZAC) 360 MG 24 hr capsule, Take 1 capsule (360 mg total) by mouth every morning., Disp: 90 capsule, Rfl: 3 .  esomeprazole (NEXIUM) 40 MG capsule, Take 1 capsule (40 mg total) by mouth every morning., Disp: 90 capsule, Rfl: 1 .  fish oil-omega-3 fatty acids 1000 MG capsule, Take 1 g by mouth every evening. , Disp: , Rfl:  .  gabapentin (NEURONTIN) 300 MG capsule,  Take 1 capsule (300 mg total) by mouth 3 (three) times daily., Disp: 270 capsule, Rfl: 1 .  glucose blood (ONE TOUCH TEST STRIPS) test strip, Use as directed twice daily to check blood sugar. DX code E11.9, Disp: 300 each, Rfl: 1 .  levocetirizine (XYZAL) 5 MG tablet, Take 1 tablet (5 mg total) by mouth every evening., Disp: 90 tablet, Rfl: 0 .  levothyroxine (SYNTHROID, LEVOTHROID) 75 MCG tablet, Take one tablet Monday through Friday and one half tablet Saturday and Sunday, Disp: 90 tablet, Rfl: 1 .  loteprednol (LOTEMAX) 0.5 % ophthalmic suspension, Place 1 drop into the left eye daily as needed ("arthiritis of the eye"). , Disp: , Rfl:  .  metFORMIN (GLUCOPHAGE-XR) 750 MG 24 hr tablet, TAKE 1 TABLET IN AM AND 1 TABLET AT LUNCH, Disp: 180 tablet, Rfl: 1 .  metoprolol succinate (TOPROL-XL) 25 MG 24 hr tablet, Take 25 mg by mouth 2 (two) times daily. , Disp: , Rfl:  .  metoprolol succinate (TOPROL-XL) 25 MG 24 hr tablet, TAKE 1 TABLET BY MOUTH TWICE A DAY AT 12 NOON AND 4PM, Disp: 180 tablet, Rfl: 1 .  mometasone (NASONEX) 50 MCG/ACT nasal spray, Place 2 sprays into the nose daily as needed., Disp: 17 g, Rfl: 5 .  Multiple Vitamins-Minerals (MULTIVITAMIN WITH MINERALS) tablet, Take 1 tablet by mouth daily with lunch. , Disp: , Rfl:  .  polyvinyl alcohol-povidone (REFRESH) 1.4-0.6 % ophthalmic solution, Place 1 drop into both eyes 2 (two) times daily., Disp: , Rfl:  .  spironolactone (ALDACTONE) 25 MG tablet, TAKE 1 TABLET BY MOUTH TWICE A DAY, Disp: 180 tablet, Rfl: 1 .  vitamin E 1000 UNIT capsule, Take 1,000 Units by mouth daily with lunch. , Disp: , Rfl:   Drug Allergies:  Allergies  Allergen Reactions  . Penicillins Itching  . Sulfa Antibiotics Itching  . Codeine Anxiety    Jittery   . Vicodin [Hydrocodone-Acetaminophen]     ITCHING     ROS: Per HPI unless specifically indicated below Review of Systems  Thank you for the opportunity to care for this patient.  Please do not hesitate to  contact me with questions.

## 2015-06-06 NOTE — Patient Instructions (Signed)
Chronic rhinitis  Currently well controlled  Continue Nasonex 1-2 sprays each nostril daily and Xyzal 5 mg daily  Shortness of breath  Coughing, currently not well controlled  Use Tessalon Perles 3 times a day as needed-prescription already given  Let us know if not better in 2 weeks

## 2015-06-06 NOTE — Assessment & Plan Note (Addendum)
   Coughing, currently not well controlled  Use Tessalon Perles 3 times a day as needed-prescription already given  Let us know if not better in 2 weeks

## 2015-06-06 NOTE — Addendum Note (Signed)
Addended by: Gerre Pebbles A on: 06/06/2015 11:08 AM   Modules accepted: Orders

## 2015-06-06 NOTE — Assessment & Plan Note (Signed)
   Currently well controlled  Continue Nasonex 1-2 sprays each nostril daily and Xyzal 5 mg daily

## 2015-06-07 ENCOUNTER — Telehealth: Payer: Self-pay | Admitting: Family Medicine

## 2015-06-07 NOTE — Telephone Encounter (Signed)
Pharmacy refill request stating patient needs a refill on atorvastatin, but states she takes an extra 2 tablets per week if this is correct needs a new rx reflecting the new dosage.

## 2015-06-07 NOTE — Telephone Encounter (Signed)
Please send new rx that keeps Atorvastatin same strength 1 tab po daily except Saturday take 2 tabs, disp #105, 1 rf

## 2015-06-08 MED ORDER — ATORVASTATIN CALCIUM 20 MG PO TABS: ORAL_TABLET | ORAL | Status: DC

## 2015-06-08 NOTE — Telephone Encounter (Signed)
Medication list updated and sent in as instructed

## 2015-06-11 ENCOUNTER — Encounter: Payer: Self-pay | Admitting: *Deleted

## 2015-06-12 DIAGNOSIS — E119 Type 2 diabetes mellitus without complications: Secondary | ICD-10-CM | POA: Diagnosis not present

## 2015-06-19 DIAGNOSIS — Z0100 Encounter for examination of eyes and vision without abnormal findings: Secondary | ICD-10-CM | POA: Diagnosis not present

## 2015-06-19 DIAGNOSIS — H5200 Hypermetropia, unspecified eye: Secondary | ICD-10-CM | POA: Diagnosis not present

## 2015-06-19 LAB — HM DIABETES EYE EXAM

## 2015-06-20 ENCOUNTER — Encounter (HOSPITAL_COMMUNITY): Payer: Self-pay | Admitting: Physician Assistant

## 2015-06-20 DIAGNOSIS — M1711 Unilateral primary osteoarthritis, right knee: Secondary | ICD-10-CM | POA: Diagnosis present

## 2015-06-20 DIAGNOSIS — Z8619 Personal history of other infectious and parasitic diseases: Secondary | ICD-10-CM | POA: Diagnosis present

## 2015-06-20 DIAGNOSIS — I499 Cardiac arrhythmia, unspecified: Secondary | ICD-10-CM | POA: Diagnosis present

## 2015-06-20 DIAGNOSIS — Z8614 Personal history of Methicillin resistant Staphylococcus aureus infection: Secondary | ICD-10-CM | POA: Diagnosis present

## 2015-06-20 NOTE — H&P (Signed)
TOTAL KNEE ADMISSION H&P  Patient is being admitted for right total knee arthroplasty.  Subjective:  Chief Complaint:right knee pain.  HPI: Victoria Walker, 65 y.o. female, has a history of pain and functional disability in the right knee due to arthritis and has failed non-surgical conservative treatments for greater than 12 weeks to includeNSAID's and/or analgesics, corticosteriod injections, viscosupplementation injections, flexibility and strengthening excercises, supervised PT with diminished ADL's post treatment, use of assistive devices, weight reduction as appropriate and activity modification.  Onset of symptoms was gradual, starting 10 years ago with gradually worsening course since that time. The patient noted no past surgery on the right knee(s).  Patient currently rates pain in the right knee(s) at 10 out of 10 with activity. Patient has night pain, worsening of pain with activity and weight bearing, pain that interferes with activities of daily living, crepitus and joint swelling.  Patient has evidence of subchondral sclerosis, periarticular osteophytes and joint space narrowing by imaging studies. There is no active infection.  Patient Active Problem List   Diagnosis Date Noted  . Primary localized osteoarthritis of right knee   . History of MRSA infection   . History of measles   . Dysrhythmia   . Chronic rhinitis 06/06/2015  . Shortness of breath 06/06/2015  . Vaginitis and vulvovaginitis 04/25/2015  . Lumbar back pain 04/25/2015  . Medicare annual wellness visit, initial 04/07/2015  . Allergic response 01/01/2015  . History of chickenpox 09/26/2014  . History of shingles   . Diabetes mellitus without complication ([place]) [id number]  . 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 Deformity of metatarsal bone of left foot 03/03/2014  . Vulvar irritation  S/P neg vulvar bx 12/2013 01/23/2014  . Multinodular non-toxic goiter 11/07/2013   . Right knee DJD 07/12/2013  . Neuralgia of left foot 06/24/2013  . Tarsal tunnel syndrome 04/08/2013  . Vaginal dryness, menopausal 04/05/2013  . Pronation deformity of ankle, acquired 02/09/2013  . Cortical cataract 02/01/2013  . H/O bone density study 01/26/2013  . Chest pain 01/05/2013  . History of iritis 01/04/2013  . Dry eye syndrome 10/07/2012  . Meibomian gland disease 10/07/2012  . GERD (gastroesophageal reflux disease) 10/05/2012  . Essential hypertension, benign 10/04/2012  . Hyperlipidemia, mixed 10/04/2012  . 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 Diabetic neuropathy ([place]) 10/04/2012  . S/P hysterectomy 10/04/2012  . Osteopenia 10/04/2012  . Hypothyroidism 10/04/2012  . History of cystocele 10/04/2012  . Dyspareunia in female 10/04/2012  . Plantar fasciitis 10/04/2012  . Nuclear sclerotic cataract 06/17/2012  . Diabetes mellitus ([place]) 03/02/2012  . BP (high blood pressure) 01/06/2012  . Iritis 01/06/2012  . Neuropathy ([place]) 01/06/2012  . Disease of thyroid gland 01/06/2012  . Breast cancer ([place]) 05/06/2011   Past Medical History  Diagnosis Date  . Hypertension   . Neuropathy ([place])     FEET  . High cholesterol   . Hypothyroidism   . GERD (gastroesophageal reflux disease)   . Cancer [facility])     breast cancer  . Dysrhythmia     HX OF FAST HEART RATE AND PALPITATIONS - METOPROLOL HAS HELPED  . Multinodular thyroid     PT HAVING TROUBLE SWALLOWING  . Diabetes mellitus without complication (HCC)     type 2 - ORAL MEDICATION - NO INSULIN  . H/O hiatal hernia   . Arthritis     knees, back; PT PLANS RIGHT TOTAL KNEE SURGERY IN THE FUTURE  . Claustrophobia   .  H/O iritis     LEFT EYE - STATES HER EYE BECOMES RED AND VERY SENSITIVE TO LIGHT WHEN FLARE UP OF IRITIS  . History of chickenpox 09/26/2014  . History of measles   . History of shingles   . History of MRSA infection   . Medicare annual wellness visit, initial 04/07/2015  . Primary localized osteoarthritis  of right knee     Past Surgical History  Procedure Laterality Date  . Thyroid operation  1976    removed tumor  . Abdominal hysterectomy  1988  . Mastectomy  2007    right  . Plantar fascia surgery Left   . Tummy tuck  1986  . Hernia repair  1986  . Cardiac catheterization  1999  . Anterior and posterior repair  05/11/2012    Procedure: ANTERIOR (CYSTOCELE) AND POSTERIOR REPAIR (RECTOCELE);  Surgeon: Reece Packer, MD;  Location: [facility];  Service: Urology;;  with graft  . Vaginal prolapse repair  05/11/2012    Procedure: VAGINAL VAULT SUSPENSION;  Surgeon: Reece Packer, MD;  Location: [facility];  Service: Urology;;  . Cystoscopy with urethral dilatation  05/11/2012    Procedure: CYSTOSCOPY WITH URETHRAL DILATATION;  Surgeon: Reece Packer, MD;  Location: [facility];  Service: Urology;;  . Thyroidectomy N/A 02/23/2014    Procedure: TOTAL THYROIDECTOMY;  Surgeon: Armandina Gemma, MD;  Location: [facility];  Service: General;  Laterality: N/A;  . Skin biopsy      vulvar    No prescriptions prior to admission   Allergies  Allergen Reactions  . Penicillins Itching  . Sulfa Antibiotics Itching  . Codeine Anxiety    Jittery   . Vicodin [Hydrocodone-Acetaminophen]     ITCHING     Social History  Substance Use Topics  . Smoking status: Never Smoker   . Smokeless tobacco: Never Used  . Alcohol Use: No    Family History  Problem Relation Age of Onset  . Hypertension Mother   . Heart disease Mother     pacer, CHF  . Hyperlipidemia Mother   . Kidney disease Mother     1 kidney due to stone  . Kidney Stones Mother   . Hypertension Father   . Cancer Father     lung, psa  . Diabetes Father   . Alcohol abuse Father   . Diabetes Sister   . Cancer Sister   . Hypertension Sister   . Diabetes Brother   . Hypertension Brother   . Hyperlipidemia Brother   . Kidney disease Brother     dialysis  . Stroke Brother   . Hypertension Maternal Aunt   . Hypertension Maternal Uncle   .  Hypertension Paternal Aunt   . Cancer Paternal Aunt   . Hypertension Paternal Uncle   . Hypertension Maternal Grandmother   . Heart disease Maternal Grandmother   . Stroke Maternal Grandmother   . Hypertension Maternal Grandfather   . Hypertension Daughter   . Arthritis Son   . Hyperlipidemia Sister   . Hypertension Sister   . Diabetes Sister   . Thyroid disease Sister   . Cancer Sister     thyroid and breast  . Arthritis Sister     back pain  . Hypertension Sister   . Thyroid disease Sister   . Hypertension Brother   . Hyperlipidemia Brother   . Hypertension Brother   . Hyperlipidemia Brother   . Benign prostatic hyperplasia Brother   . Hypertension Daughter   . Hyperlipidemia  Daughter   . Diabetes Daughter      Review of Systems  Constitutional: Negative.   HENT: Negative.   Eyes: Negative.   Respiratory: Negative.   Cardiovascular: Negative for chest pain, palpitations, orthopnea, claudication and leg swelling.  Gastrointestinal: Negative.   Genitourinary: Negative.   Musculoskeletal: Positive for back pain and joint pain.  Skin: Negative.   Neurological: Negative.   Endo/Heme/Allergies: Negative.   Psychiatric/Behavioral: Negative.     Objective:  Physical Exam  Constitutional: She is oriented to person, place, and time. She appears well-developed and well-nourished.  HENT:  Head: Normocephalic and atraumatic.  Eyes: Conjunctivae are normal. Pupils are equal, round, and reactive to light.  Neck: Normal range of motion. Neck supple.  Cardiovascular:  Irregular rhythm with a new heart murmur.    Respiratory: Effort normal and breath sounds normal.  GI: Soft. Bowel sounds are normal.  Musculoskeletal:  Examination of both knees reveal pain bilaterally.  1+ crepitation.  1+ synovitis.  Range of motion 0-120 degrees.  Knees are stable with normal patella tracking.  Vascular exam: Pulses are 2+ and symmetric.  Neurologic exam: Distal motor and sensory  examination is within normal limits.   Neurological: She is alert and oriented to person, place, and time.  Skin: Skin is warm and dry.  Psychiatric: She has a normal mood and affect. Her behavior is normal.    Vital signs in last 24 hours:    Labs:   Estimated body mass index is 29.52 kg/(m^2) as calculated from the following:   Height as of 04/25/15: 5\' 5"  (1.651 m).   Weight as of 04/25/15: 80.468 kg (177 lb 6.4 oz).   Imaging Review Plain radiographs demonstrate severe degenerative joint disease of the right knee(s). The overall alignment issignificant varus. The bone quality appears to be good for age and reported activity level.  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 The treatment options including medical management, injection therapy arthroscopy and arthroplasty were discussed at length. The risks and benefits of total knee arthroplasty were presented and reviewed. The risks due to aseptic loosening, infection, stiffness, patella tracking problems, thromboembolic complications and other imponderables were discussed. The patient acknowledged the explanation, agreed to proceed with the plan and consent was signed. Patient is being admitted for inpatient  treatment for surgery, pain control, PT, OT, prophylactic antibiotics, VTE prophylaxis, progressive ambulation and ADL's and discharge planning. The patient is planning to be discharged home with home health services   Patient had a heart murmur. Years ago.  She saw Dr Stanford Breed 3 yrs ago who told her she did not have a murmur.  Today she also has an irregular rhythm along with a murmur.  Will set up to see cardiology prior to surgery to determine if preoperative echo  is needed.  Pattye Meda A. Kaleen Mask Physician Assistant [facility] [phone]  06/20/2015, 3:42 PM

## 2015-06-21 ENCOUNTER — Encounter (HOSPITAL_COMMUNITY): Payer: Self-pay

## 2015-06-21 ENCOUNTER — Encounter (HOSPITAL_COMMUNITY)
Admission: RE | Admit: 2015-06-21 | Discharge: 2015-06-21 | Disposition: A | Discharge status: Home / Self Care | Payer: Medicare HMO | Source: Ambulatory Visit | Attending: Orthopedic Surgery | Admitting: Orthopedic Surgery

## 2015-06-21 DIAGNOSIS — E7800 Pure hypercholesterolemia, unspecified: Secondary | ICD-10-CM | POA: Diagnosis not present

## 2015-06-21 DIAGNOSIS — Z7984 Long term (current) use of oral hypoglycemic drugs: Secondary | ICD-10-CM | POA: Diagnosis not present

## 2015-06-21 DIAGNOSIS — Z853 Personal history of malignant neoplasm of breast: Secondary | ICD-10-CM | POA: Insufficient documentation

## 2015-06-21 DIAGNOSIS — E1142 Type 2 diabetes mellitus with diabetic polyneuropathy: Secondary | ICD-10-CM | POA: Diagnosis not present

## 2015-06-21 DIAGNOSIS — Z9011 Acquired absence of right breast and nipple: Secondary | ICD-10-CM | POA: Diagnosis not present

## 2015-06-21 DIAGNOSIS — K219 Gastro-esophageal reflux disease without esophagitis: Secondary | ICD-10-CM | POA: Insufficient documentation

## 2015-06-21 DIAGNOSIS — Z8614 Personal history of Methicillin resistant Staphylococcus aureus infection: Secondary | ICD-10-CM | POA: Insufficient documentation

## 2015-06-21 DIAGNOSIS — Z01818 Encounter for other preprocedural examination: Secondary | ICD-10-CM | POA: Diagnosis not present

## 2015-06-21 DIAGNOSIS — E890 Postprocedural hypothyroidism: Secondary | ICD-10-CM | POA: Diagnosis not present

## 2015-06-21 DIAGNOSIS — Z01812 Encounter for preprocedural laboratory examination: Secondary | ICD-10-CM | POA: Diagnosis not present

## 2015-06-21 DIAGNOSIS — I10 Essential (primary) hypertension: Secondary | ICD-10-CM | POA: Diagnosis not present

## 2015-06-21 DIAGNOSIS — M1711 Unilateral primary osteoarthritis, right knee: Secondary | ICD-10-CM | POA: Insufficient documentation

## 2015-06-21 DIAGNOSIS — Z0183 Encounter for blood typing: Secondary | ICD-10-CM | POA: Insufficient documentation

## 2015-06-21 DIAGNOSIS — Z79899 Other long term (current) drug therapy: Secondary | ICD-10-CM | POA: Diagnosis not present

## 2015-06-21 HISTORY — DX: Presence of spectacles and contact lenses: Z97.3

## 2015-06-21 HISTORY — DX: Other reaction to spinal and lumbar puncture: G97.1

## 2015-06-21 LAB — CBC WITH DIFFERENTIAL/PLATELET
Basophils Absolute: 0 10*3/uL (ref 0.0–0.1)
Basophils Relative: 1 %
Eosinophils Absolute: 0.2 10*3/uL (ref 0.0–0.7)
Eosinophils Relative: 3 %
HCT: 40.2 % (ref 36.0–46.0)
Hemoglobin: 13.5 g/dL (ref 12.0–15.0)
Lymphocytes Relative: 50 %
Lymphs Abs: 4.1 10*3/uL — ABNORMAL HIGH (ref 0.7–4.0)
MCH: 31.8 pg (ref 26.0–34.0)
MCHC: 33.6 g/dL (ref 30.0–36.0)
MCV: 94.8 fL (ref 78.0–100.0)
Monocytes Absolute: 0.7 10*3/uL (ref 0.1–1.0)
Monocytes Relative: 9 %
Neutro Abs: 2.9 10*3/uL (ref 1.7–7.7)
Neutrophils Relative %: 37 %
Platelets: 256 10*3/uL (ref 150–400)
RBC: 4.24 MIL/uL (ref 3.87–5.11)
RDW: 12.8 % (ref 11.5–15.5)
WBC: 8 10*3/uL (ref 4.0–10.5)

## 2015-06-21 LAB — COMPREHENSIVE METABOLIC PANEL
ALT: 24 U/L (ref 14–54)
AST: 22 U/L (ref 15–41)
Albumin: 4.4 g/dL (ref 3.5–5.0)
Alkaline Phosphatase: 57 U/L (ref 38–126)
Anion gap: 9 (ref 5–15)
BUN: 12 mg/dL (ref 6–20)
CO2: 26 mmol/L (ref 22–32)
Calcium: 9.9 mg/dL (ref 8.9–10.3)
Chloride: 107 mmol/L (ref 101–111)
Creatinine, Ser: 0.8 mg/dL (ref 0.44–1.00)
GFR calc Af Amer: >60 mL/min (ref >60)
GFR calc non Af Amer: >60 mL/min (ref >60)
Glucose, Bld: 79 mg/dL (ref 65–99)
Potassium: 4.5 mmol/L (ref 3.5–5.1)
Sodium: 142 mmol/L (ref 135–145)
Total Bilirubin: 0.4 mg/dL (ref 0.3–1.2)
Total Protein: 7.1 g/dL (ref 6.5–8.1)

## 2015-06-21 LAB — GLUCOSE, CAPILLARY: Glucose-Capillary: 113 mg/dL — ABNORMAL HIGH (ref 65–99)

## 2015-06-21 LAB — ABO/RH: ABO/RH(D): O POS

## 2015-06-21 LAB — TYPE AND SCREEN
ABO/RH(D): O POS
Antibody Screen: NEGATIVE

## 2015-06-21 LAB — SURGICAL PCR SCREEN
MRSA, PCR: NEGATIVE
Staphylococcus aureus: NEGATIVE

## 2015-06-21 LAB — PROTIME-INR
INR: 1.01 (ref 0.00–1.49)
Prothrombin Time: 13.5 seconds (ref 11.6–15.2)

## 2015-06-21 LAB — APTT: aPTT: 29 seconds (ref 24–37)

## 2015-06-21 NOTE — Progress Notes (Signed)
PCP - Dr. Penni Homans Cardiologist - Dr. Stanford Breed  EKG - pending CXR - 05/2015 - Epic  Echo- denies Stress - 2014 Cardiac Cath - denies  Patient denies chest pain and shortness of breath at PAT appointment.  Patient states that she will see Dr. Stanford Breed on 06/26/15 for cardiac clearance.

## 2015-06-21 NOTE — Pre-Procedure Instructions (Signed)
Victoria Walker  06/21/2015      [id number] [place], [place] - [address] AT [address]OSS [address] [place] [address] Phone: [phone] Fax: [phone]  [place], [place] [address] Phone: [phone] Fax: [phone]  [id number] [place], [place] - [place] AT [address] [place] [place] [address] Phone: [phone] Fax: [phone]  [facility] - [place] - [address] [place] [address] Phone: [phone] Fax: [phone]  [place], [place] [address] Phone: [phone] Fax: [phone]    Your procedure is scheduled on Monday, March 13th, 2017.  Report to [facility] Admitting at 5:30 A.M.   Call this number if you have problems the morning of surgery:  [phone]   Remember:  Do not eat food or drink liquids after midnight.   Take these medicines the morning of surgery with A SIP OF WATER: Diltiazem (Tiazac), Esomeprazole (Nexium), Metoprolol Succinate (Toprol-XL), Nasal spray if needed, eye drops if needed.   What do I do about my diabetes medications?   Do not take oral diabetes medicines (pills) the morning of surgery.  Do NOT take Metformin the morning of surgery.    Do not take other diabetes injectables the day of surgery including Byetta, Victoza, Bydureon, and Trulicity.   7 days prior to surgery, stop taking: Aspirin, NSAIDS, Aleve, Naproxen, Ibuprofen, Advil, Motrin, BC's, Goody's, Fish oil, all herbal medications, and all vitamins.     Do not wear jewelry, make-up or nail polish.  Do not wear lotions, powders, or perfumes.    Do not shave 48 hours prior to surgery.    Do not bring valuables to the  hospital.   [facility] is not responsible for any belongings or valuables.  Contacts, dentures or bridgework may not be worn into surgery.  Leave your suitcase in the car.  After surgery it may be brought to your room.  For patients admitted to the hospital, discharge time will be determined by your treatment team.  Patients discharged the day of surgery will not be allowed to drive home.   Special instructions:  See attached.   Please read over the following fact sheets that you were given. Pain Booklet, Coughing and Deep Breathing, Blood Transfusion Information, Total Joint Packet, MRSA Information and Surgical Site Infection Prevention    How to Manage Your Diabetes Before Surgery   Why is it important to control my blood sugar before and after surgery?   Improving blood sugar levels before and after surgery helps healing and can limit problems.  A way of improving blood sugar control is eating a healthy diet by:  - Eating less sugar and carbohydrates  - Increasing activity/exercise  - Talk with your doctor about reaching your blood sugar goals  High blood sugars (greater than 180 mg/dL) can raise your risk of infections and slow down your recovery so you will need to focus on controlling your diabetes during the weeks before surgery.  Make sure that the doctor who takes care of your diabetes knows about your planned surgery including the date and location.  How do I manage my blood sugars before surgery?   Check your blood sugar at  least 4 times a day, 2 days before surgery to make sure that they are not too high or low.   Check your blood sugar the morning of your surgery when you wake up and every 2 hours until you get to the Short-Stay unit.  If your blood sugar is less than 70 mg/dL, you will need to treat for low blood sugar by:  Treat a low blood sugar (less than 70 mg/dL) with 1/2 cup of clear juice (cranberry or apple), 4 glucose tablets, OR glucose  gel.  Recheck blood sugar in 15 minutes after treatment (to make sure it is greater than 70 mg/dL).  If blood sugar is not greater than 70 mg/dL on re-check, call [phone] for further instructions.   Report your blood sugar to the Short-Stay nurse when you get to Short-Stay.  References:  [facility], 2007 "How to Manage your Diabetes Before and After Surgery".

## 2015-06-22 ENCOUNTER — Telehealth: Payer: Self-pay | Admitting: *Deleted

## 2015-06-22 ENCOUNTER — Encounter: Payer: Self-pay | Admitting: Family Medicine

## 2015-06-22 LAB — HEMOGLOBIN A1C
Hgb A1c MFr Bld: 6.4 % — ABNORMAL HIGH (ref 4.8–5.6)
Mean Plasma Glucose: 137 mg/dL

## 2015-06-22 LAB — URINE CULTURE

## 2015-06-22 NOTE — Telephone Encounter (Signed)
Patient scheduled surgical clearance appointment for 06/26/15 at 9:30am  with Johnnette Gourd.

## 2015-06-22 NOTE — Telephone Encounter (Addendum)
Received request for Sx Clearance, pt having TKR on 07/02/15, will check with PCP on getting patient in before that date and/or if we need to schedule her with another provider in office/SLS 03/03 [facility] with contact name and number for return call RE: call back to schedule her for Sx Clearance with another provider [after speaking w/Dr. Blyth] prior to her 07/02/15 surgical date/SLS 03/03

## 2015-06-22 NOTE — Telephone Encounter (Signed)
[place] form forwarded to [place], CMA/SLS 06/22/15

## 2015-06-25 NOTE — Progress Notes (Addendum)
Anesthesia Chart Review: Patient is a 65 year old female scheduled for right TKA on 07/02/15 by Dr. Noemi Chapel.  History includes non-smoker, HTN, hypercholesterolemia, palpitations, multinodular goiter s/p total thyroidectomy '15 with secondary hypothyroidism, GERD, hiatal hernia, breast cancer s/p right mastectomy '07, DM2, peripheral neuropathy (feet), claustrophobia, left eye iritis, MRSA, spinal headache (after epidural), hysterectomy, abdominoplasty.   She is seeing her PCP Dr. Penni Homans for medical clearance on 06/26/15.  She was referred to cardiology by Matthew Saras, PA with Dr. Noemi Chapel after a murmur and irregular heart beat were noted on physical exam. Pre-op EKG also showed possible ischemic changes. Patient has an appointment with Lyda Jester, PA with Dr. Stanford Breed on 06/26/15. He last saw her on 01/05/13 for evaluation of chest pain, and had a low risk stress test at that time.   Meds include Lipitor, Avapro, Oscal with D, diltiazem, Nexium, fish oil-omega-3 fatty acids, Neurontin, levocetirizine, levothyroxine, metformin, Lotemax ophthalmic, Toprol, Nasonex, spironolactone.  06/21/15 EKG: NSR, T wave abnormality, consider anterolateral ischemia. T wave abnormality, consider inferior ischemia. Inferior T wave abnormality is more pronounced and anterolateral abnormality appears new when compared to 02/14/14 and 01/04/13 tracings.   01/17/13 Nuclear stress test: Overall Impression: Low risk stress nuclear study with small, moderate intensity, fixed distal anterior and basal inferior wall defects consistent with soft tissue attenuation/thinning; no ischemia. LV Ejection Fraction: 80%. LV Wall Motion: NL LV Function; NL Wall Motion.   05/24/15 CXR: IMPRESSION: No active cardiopulmonary disease.  06/06/15 Spirometry (best meaurements): FVC 2.30 (87%), FEV1 1.96 (96%), FEF25-75% 2.36 (121%). Interpretation: Normal ventilatory function.   Preoperative labs noted. A1c 6.4. Urine culture showed  multiple species present, suggest recollection.  Chart will be left for follow-up regarding medical and cardiology recommendations/clearance status.  George Hugh [facility] Short Stay Center/Anesthesiology Phone [phone] 06/25/2015 6:20 PM  Addendum:   Patient seen by by Mackie Pai, PA-C [facility]) on 06/26/15. It appears she would be cleared medically as long as she was cleared from a cardiac standpoint.  Patient was seen at [facility] on 06/26/15 by Lyda Jester, PA-C. A stress and echo were ordered (see below). Based on results, Lyda Jester wrote, "Low risk stress test. Can be cleared for surgery."  06/26/15 EKG: NSR, LAD, T wave abnormality, consider anterolateral ischemia.  06/28/15 Echo: Study Conclusions - Left ventricle: The cavity size was normal. Systolic function was  normal. The estimated ejection fraction was in the range of 55%  to 60%. Wall motion was normal; there were no regional wall  motion abnormalities. Doppler parameters are consistent with  abnormal left ventricular relaxation (grade 1 diastolic  dysfunction). - Left atrium: The atrium was mildly dilated. Impressions: - Normal global LV longitudinal strain -18.6%  06/28/15 Nuclear stress test:  The left ventricular ejection fraction is hyperdynamic (>65%).  Nuclear stress EF: 77%.  There was no ST segment deviation noted during stress.  No T wave inversion was noted during stress.  Defect 1: There is a small defect of moderate severity present in the mid anterior and apical anterior location. This is similar to her last stress test in 2014, though more prominent. Likely breast attenuation artifact, but cannot rule out prior infarct.  This is a low risk study.  George Hugh [facility] Short Stay Center/Anesthesiology Phone [phone] 06/29/2015 9:43 AM

## 2015-06-26 ENCOUNTER — Encounter: Payer: Self-pay | Admitting: Medical

## 2015-06-26 ENCOUNTER — Encounter: Payer: Self-pay | Admitting: Cardiology

## 2015-06-26 ENCOUNTER — Ambulatory Visit (INDEPENDENT_AMBULATORY_CARE_PROVIDER_SITE_OTHER): Payer: Medicare HMO | Admitting: Medical

## 2015-06-26 ENCOUNTER — Ambulatory Visit (INDEPENDENT_AMBULATORY_CARE_PROVIDER_SITE_OTHER): Payer: Medicare HMO | Admitting: Cardiology

## 2015-06-26 ENCOUNTER — Ambulatory Visit: Payer: Medicare Other | Admitting: Family Medicine

## 2015-06-26 VITALS — BP 126/80 | HR 78 | Temp 98.1°F | Ht 65.0 in | Wt 176.0 lb

## 2015-06-26 VITALS — BP 120/80 | HR 65 | Ht 65.0 in | Wt 178.8 lb

## 2015-06-26 DIAGNOSIS — Z01818 Encounter for other preprocedural examination: Secondary | ICD-10-CM

## 2015-06-26 DIAGNOSIS — R9431 Abnormal electrocardiogram [ECG] [EKG]: Secondary | ICD-10-CM | POA: Diagnosis not present

## 2015-06-26 DIAGNOSIS — R079 Chest pain, unspecified: Secondary | ICD-10-CM | POA: Diagnosis not present

## 2015-06-26 MED ORDER — ESOMEPRAZOLE MAGNESIUM 40 MG PO CPDR: 40.0000 mg | DELAYED_RELEASE_CAPSULE | ORAL | Status: DC

## 2015-06-26 NOTE — Progress Notes (Signed)
Subjective:    Patient ID: Victoria Walker, female    DOB: 1951-04-19, 65 y.o.   MRN: [id number]  HPI  Pt in for surgical evaluation. Pt has appointment with cardiologist evaluation later today.  Pt tentative date of surgey is 13th march pending clearance.(Rt knee replacement).  Pt had some t wave changes on last ekg recently. No recent chest pain. Pt already scheduled to see cardiologist.  Pt bp is good today.   No history of problems with general anesthesia. She may have just epidural.   No family history of problems sudden death or problems with anesthesia.  Cbc showed no anemia. Normal platlets  Pt cmp good. Normal blood sugar.  No  asthma history or smoking.    Review of Systems  Constitutional: Negative for fever, chills, diaphoresis, activity change and fatigue.  Respiratory: Negative for cough, chest tightness and shortness of breath.   Cardiovascular: Positive for leg swelling. Negative for chest pain and palpitations.  Gastrointestinal: Negative for nausea, vomiting and abdominal pain.  Musculoskeletal: Negative for neck pain and neck stiffness.       Rt knee pain.  Neurological: Negative for dizziness, speech difficulty, weakness, light-headedness, numbness and headaches.  Psychiatric/Behavioral: Negative for behavioral problems, confusion and agitation. The patient is not nervous/anxious.     Past Medical History  Diagnosis Date  . Hypertension   . Neuropathy ([place])     FEET  . High cholesterol   . Hypothyroidism   . GERD (gastroesophageal reflux disease)   . Cancer [facility])     breast cancer  . Multinodular thyroid     PT HAVING TROUBLE SWALLOWING  . Diabetes mellitus without complication (HCC)     type 2 - ORAL MEDICATION - NO INSULIN  . H/O hiatal hernia   . Arthritis     knees, back; PT PLANS RIGHT TOTAL KNEE SURGERY IN THE FUTURE  . Claustrophobia   . H/O iritis     LEFT EYE - STATES HER EYE BECOMES RED AND VERY SENSITIVE TO LIGHT WHEN FLARE UP  OF IRITIS  . History of chickenpox 09/26/2014  . History of measles   . History of shingles   . History of MRSA infection   . Medicare annual wellness visit, initial 04/07/2015  . Primary localized osteoarthritis of right knee   . Spinal headache     spinal headache with epidural  . Dysrhythmia     HX OF FAST HEART RATE AND PALPITATIONS - METOPROLOL HAS HELPED  . Wears glasses   . History of bronchitis     Social History   Social History  . Marital Status: Married    Spouse Name: N/A  . Number of Children: 3  . Years of Education: N/A   Occupational History  .      Retired   Social History Main Topics  . Smoking status: Never Smoker   . Smokeless tobacco: Never Used  . Alcohol Use: No  . Drug Use: No  . Sexual Activity: Yes    Birth Control/ Protection: Surgical     Comment: lives with husband, retired from various jobs, diabetic diet   Other Topics Concern  . Not on file   Social History Narrative    Past Surgical History  Procedure Laterality Date  . Thyroid operation  1976    removed tumor  . Abdominal hysterectomy  1988  . Plantar fascia surgery Left   . Tummy tuck  1986  . Hernia repair  1986  . Cardiac  catheterization  1999  . Anterior and posterior repair  05/11/2012    Procedure: ANTERIOR (CYSTOCELE) AND POSTERIOR REPAIR (RECTOCELE);  Surgeon: Reece Packer, MD;  Location: [facility];  Service: Urology;;  with graft  . Vaginal prolapse repair  05/11/2012    Procedure: VAGINAL VAULT SUSPENSION;  Surgeon: Reece Packer, MD;  Location: [facility];  Service: Urology;;  . Cystoscopy with urethral dilatation  05/11/2012    Procedure: CYSTOSCOPY WITH URETHRAL DILATATION;  Surgeon: Reece Packer, MD;  Location: [facility];  Service: Urology;;  . Thyroidectomy N/A 02/23/2014    Procedure: TOTAL THYROIDECTOMY;  Surgeon: Armandina Gemma, MD;  Location: [facility];  Service: General;  Laterality: N/A;  . Skin biopsy      vulvar  . Mastectomy  2007    right  .  Colonoscopy    . Esophagogastroduodenoscopy      Family History  Problem Relation Age of Onset  . Hypertension Mother   . Heart disease Mother     pacer, CHF  . Hyperlipidemia Mother   . Kidney disease Mother     1 kidney due to stone  . Kidney Stones Mother   . Hypertension Father   . Cancer Father     lung, psa  . Diabetes Father   . Alcohol abuse Father   . Diabetes Sister   . Cancer Sister   . Hypertension Sister   . Diabetes Brother   . Hypertension Brother   . Hyperlipidemia Brother   . Kidney disease Brother     dialysis  . Stroke Brother   . Hypertension Maternal Aunt   . Hypertension Maternal Uncle   . Hypertension Paternal Aunt   . Cancer Paternal Aunt   . Hypertension Paternal Uncle   . Hypertension Maternal Grandmother   . Heart disease Maternal Grandmother   . Stroke Maternal Grandmother   . Hypertension Maternal Grandfather   . Hypertension Daughter   . Arthritis Son   . Hyperlipidemia Sister   . Hypertension Sister   . Diabetes Sister   . Thyroid disease Sister   . Cancer Sister     thyroid and breast  . Arthritis Sister     back pain  . Hypertension Sister   . Thyroid disease Sister   . Hypertension Brother   . Hyperlipidemia Brother   . Hypertension Brother   . Hyperlipidemia Brother   . Benign prostatic hyperplasia Brother   . Hypertension Daughter   . Hyperlipidemia Daughter   . Diabetes Daughter     Allergies  Allergen Reactions  . Penicillins Itching  . Sulfa Antibiotics Itching  . Codeine Anxiety    Jittery   . Vicodin [Hydrocodone-Acetaminophen]     ITCHING     Current Outpatient Prescriptions on File Prior to Visit  Medication Sig Dispense Refill  . atorvastatin (LIPITOR) 20 MG tablet Take 1 tablet daily and on Saturday take 2 tablets. (Patient taking differently: Take 20-40 mg by mouth daily at 6 PM. Take 1 tablet daily and on Saturday and Sunday take 2 tablets.) 105 tablet 2  . AVAPRO 150 MG tablet Take 0.5-1 tablets  (75-150 mg total) by mouth daily. (Patient taking differently: Take 75-150 mg by mouth at bedtime. ) 30 tablet 5  . calcium-vitamin D (OSCAL WITH D) 250-125 MG-UNIT per tablet Take 1 tablet by mouth daily.     . Cholecalciferol (VITAMIN D) 2000 UNITS CAPS Take 1 capsule by mouth every evening.     . Ciclopirox 1 %  shampoo Apply 1 each topically every Friday.     . cycloSPORINE (RESTASIS) 0.05 % ophthalmic emulsion Place 1 drop into both eyes 2 (two) times daily.    . diclofenac sodium (VOLTAREN) 1 % GEL Apply 2 g topically daily. To knee if needed for pain.    Marland Kitchen diltiazem (TIAZAC) 360 MG 24 hr capsule Take 1 capsule (360 mg total) by mouth every morning. 90 capsule 3  . fish oil-omega-3 fatty acids 1000 MG capsule Take 1,500 mg by mouth daily at 12 noon.     . gabapentin (NEURONTIN) 300 MG capsule Take 1 capsule (300 mg total) by mouth 3 (three) times daily. (Patient taking differently: Take 300 mg by mouth at bedtime. ) 270 capsule 1  . glucose blood (ONE TOUCH TEST STRIPS) test strip Use as directed twice daily to check blood sugar. DX code E11.9 300 each 1  . levocetirizine (XYZAL) 5 MG tablet Take 1 tablet (5 mg total) by mouth every evening. 90 tablet 0  . levothyroxine (SYNTHROID, LEVOTHROID) 75 MCG tablet Take one tablet Monday through Friday and one half tablet Saturday and Sunday (Patient taking differently: Take 37.5-75 mcg by mouth daily before breakfast. Take one tablet Monday through Friday and one half tablet Saturday and Sunday) 90 tablet 1  . loteprednol (LOTEMAX) 0.5 % ophthalmic suspension Place 1 drop into the left eye daily as needed ("arthiritis of the eye").     . metFORMIN (GLUCOPHAGE-XR) 750 MG 24 hr tablet TAKE 1 TABLET IN AM AND 1 TABLET AT LUNCH (Patient taking differently: TAKE 1 TABLET IN AM AND 1 TABLET AT supper) 180 tablet 1  . metoprolol succinate (TOPROL-XL) 25 MG 24 hr tablet Take 25 mg by mouth 2 (two) times daily.     . mometasone (NASONEX) 50 MCG/ACT nasal spray  Place 2 sprays into the nose daily as needed. (Patient taking differently: Place 1-2 sprays into the nose daily as needed (for congestion). ) 17 g 5  . polyvinyl alcohol-povidone (REFRESH) 1.4-0.6 % ophthalmic solution Place 1 drop into both eyes 2 (two) times daily.    . sodium chloride (OCEAN) 0.65 % SOLN nasal spray Place 1 spray into both nostrils daily as needed for congestion.    Marland Kitchen spironolactone (ALDACTONE) 25 MG tablet TAKE 1 TABLET BY MOUTH TWICE A DAY (Patient taking differently: TAKE 1.5 TABLET (37.5) BY MOUTH daily) 180 tablet 1  . vitamin E 1000 UNIT capsule Take 1,000 Units by mouth daily with lunch. Reported on 06/19/2015     No current facility-administered medications on file prior to visit.    BP 126/80 mmHg  Pulse 78  Temp(Src) 98.1 F (36.7 C) (Oral)  Ht 5\' 5"  (1.651 m)  Wt 176 lb (79.833 kg)  BMI 29.29 kg/m2  SpO2 98%       Objective:   Physical Exam  General Mental Status- Alert. General Appearance- Not in acute distress.   Skin General: Color- Normal Color. Moisture- Normal Moisture.  Neck Carotid Arteries- Normal color. Moisture- Normal Moisture. No carotid bruits. No JVD.  Chest and Lung Exam Auscultation: Breath Sounds:-Normal.  Cardiovascular Auscultation:Rythm- Regular. Murmurs & Other Heart Sounds:Auscultation of the heart reveals- No Murmurs.  Abdomen Inspection:-Inspeection Normal. Palpation/Percussion:Note:No mass. Palpation and Percussion of the abdomen reveal- Non Tender, Non Distended + BS, no rebound or guarding.    Neurologic Cranial Nerve exam:- CN III-XII intact(No nystagmus), symmetric smile. Strength:- 5/5 equal and symmetric strength both upper and lower extremities.  Lower ext- no pedal edema, negative homans  signs. Rt knee- moderate crepitus on flexion and extension.      Assessment & Plan:  By exam, recent history and lab review, you appear to be  clear for surgery but we will need to review cardiologist  visit/clearance before we give clearance/final determination. I have asked our staff to get any forms from orthopedist  that we need to fill out. Will fill those out for your once reviewing cardiologist notes.  Follow up here as regular scheduled with pcp or as needed with myself.  Later I did get the form and just cleared medically. I did not mark cardiac. Noted cardiologsit would need to clear.

## 2015-06-26 NOTE — Patient Instructions (Addendum)
By exam, recent history and lab review, you appear to be  clear for surgery but we will need to review cardiologist visit/clearance before we give clearance/final determination. I have asked our staff to get any forms from orthopedist  that we need to fill out. Will fill those out for your once reviewing cardiologist notes.  Follow up here as regular scheduled with pcp or as needed with myself.

## 2015-06-26 NOTE — Patient Instructions (Signed)
Medication Instructions:  Your physician recommends that you continue on your current medications as directed. Please refer to the Current Medication list given to you today.   Labwork: None ordered  Testing/Procedures: Your physician has requested that you have an echocardiogram ASAP.  Echocardiography is a painless test that uses sound waves to create images of your heart. It provides your doctor with information about the size and shape of your heart and how well your heart's chambers and valves are working. This procedure takes approximately one hour. There are no restrictions for this procedure.   Your physician has requested that you have a lexiscan myoview ASAP.  For further information please visit [email or web address]. Please follow instruction sheet, as given.    Follow-Up: We will call you with your test results  Any Other Special Instructions Will Be Listed Below (If Applicable).   Echocardiogram An echocardiogram, or echocardiography, uses sound waves (ultrasound) to produce an image of your heart. The echocardiogram is simple, painless, obtained within a short period of time, and offers valuable information to your health care provider. The images from an echocardiogram can provide information such as:  Evidence of coronary artery disease (CAD).  Heart size.  Heart muscle function.  Heart valve function.  Aneurysm detection.  Evidence of a past heart attack.  Fluid buildup around the heart.  Heart muscle thickening.  Assess heart valve function. LET [facility] CARE PROVIDER KNOW ABOUT:  Any allergies you have.  All medicines you are taking, including vitamins, herbs, eye drops, creams, and over-the-counter medicines.  Previous problems you or members of your family have had with the use of anesthetics.  Any blood disorders you have.  Previous surgeries you have had.  Medical conditions you have.  Possibility of pregnancy, if this applies. BEFORE  THE PROCEDURE  No special preparation is needed. Eat and drink normally.  PROCEDURE   In order to produce an image of your heart, gel will be applied to your chest and a wand-like tool (transducer) will be moved over your chest. The gel will help transmit the sound waves from the transducer. The sound waves will harmlessly bounce off your heart to allow the heart images to be captured in real-time motion. These images will then be recorded.  You may need an IV to receive a medicine that improves the quality of the pictures. AFTER THE PROCEDURE You may return to your normal schedule including diet, activities, and medicines, unless your health care provider tells you otherwise.   This information is not intended to replace advice given to you by your health care provider. Make sure you discuss any questions you have with your health care provider.   Document Released: 04/04/2000 Document Revised: 04/28/2014 Document Reviewed: 12/13/2012 Elsevier Interactive Patient Education 2016 [place]. Pharmacologic Stress Electrocardiogram A pharmacologic stress electrocardiogram is a heart (cardiac) test that uses nuclear imaging to evaluate the blood supply to your heart. This test may also be called a pharmacologic stress electrocardiography. Pharmacologic means that a medicine is used to increase your heart rate and blood pressure.  This stress test is done to find areas of poor blood flow to the heart by determining the extent of coronary artery disease (CAD). Some people exercise on a treadmill, which naturally increases the blood flow to the heart. For those people unable to exercise on a treadmill, a medicine is used. This medicine stimulates your heart and will cause your heart to beat harder and more quickly, as if you were exercising.  Pharmacologic  stress tests can help determine:  The adequacy of blood flow to your heart during increased levels of activity in order to clear you for discharge  home.  The extent of coronary artery blockage caused by CAD.  Your prognosis if you have suffered a heart attack.  The effectiveness of cardiac procedures done, such as an angioplasty, which can increase the circulation in your coronary arteries.  Causes of chest pain or pressure. LET [facility] CARE PROVIDER KNOW ABOUT:  Any allergies you have.  All medicines you are taking, including vitamins, herbs, eye drops, creams, and over-the-counter medicines.  Previous problems you or members of your family have had with the use of anesthetics.  Any blood disorders you have.  Previous surgeries you have had.  Medical conditions you have.  Possibility of pregnancy, if this applies.  If you are currently breastfeeding. RISKS AND COMPLICATIONS Generally, this is a safe procedure. However, as with any procedure, complications can occur. Possible complications include:  You develop pain or pressure in the following areas:  Chest.  Jaw or neck.  Between your shoulder blades.  Radiating down your left arm.  Headache.  Dizziness or light-headedness.  Shortness of breath.  Increased or irregular heartbeat.  Low blood pressure.  Nausea or vomiting.  Flushing.  Redness going up the arm and slight pain during injection of medicine.  Heart attack (rare). BEFORE THE PROCEDURE   Avoid all forms of caffeine for 24 hours before your test or as directed by your health care provider. This includes coffee, tea (even decaffeinated tea), caffeinated sodas, chocolate, cocoa, and certain pain medicines.  Follow your health care provider's instructions regarding eating and drinking before the test.  Take your medicines as directed at regular times with water unless instructed otherwise. Exceptions may include:  If you have diabetes, ask how you are to take your insulin or pills. It is common to adjust insulin dosing the morning of the test.  If you are taking beta-blocker  medicines, it is important to talk to your health care provider about these medicines well before the date of your test. Taking beta-blocker medicines may interfere with the test. In some cases, these medicines need to be changed or stopped 24 hours or more before the test.  If you wear a nitroglycerin patch, it may need to be removed prior to the test. Ask your health care provider if the patch should be removed before the test.  If you use an inhaler for any breathing condition, bring it with you to the test.  If you are an outpatient, bring a snack so you can eat right after the stress phase of the test.  Do not smoke for 4 hours prior to the test or as directed by your health care provider.  Do not apply lotions, powders, creams, or oils on your chest prior to the test.  Wear comfortable shoes and clothing. Let your health care provider know if you were unable to complete or follow the preparations for your test. PROCEDURE   Multiple patches (electrodes) will be put on your chest. If needed, small areas of your chest may be shaved to get better contact with the electrodes. Once the electrodes are attached to your body, multiple wires will be attached to the electrodes, and your heart rate will be monitored.  An IV access will be started. A nuclear trace (isotope) is given. The isotope may be given intravenously, or it may be swallowed. Nuclear refers to several types of radioactive  isotopes, and the nuclear isotope lights up the arteries so that the nuclear images are clear. The isotope is absorbed by your body. This results in low radiation exposure.  A resting nuclear image is taken to show how your heart functions at rest.  A medicine is given through the IV access.  A second scan is done about 1 hour after the medicine injection and determines how your heart functions under stress.  During this stress phase, you will be connected to an electrocardiogram machine. Your blood pressure  and oxygen levels will be monitored. AFTER THE PROCEDURE   Your heart rate and blood pressure will be monitored after the test.  You may return to your normal schedule, including diet,activities, and medicines, unless your health care provider tells you otherwise.   This information is not intended to replace advice given to you by your health care provider. Make sure you discuss any questions you have with your health care provider.   Document Released: 08/24/2008 Document Revised: 04/12/2013 Document Reviewed: 12/13/2012 Elsevier Interactive Patient Education [employer].   If you need a refill on your cardiac medications before your next appointment, please call your pharmacy.

## 2015-06-26 NOTE — Progress Notes (Signed)
06/26/2015 Victoria Walker   22-Feb-1951  [id number]  Primary Physician Penni Homans, MD Primary Cardiologist: Dr. Stanford Breed   Reason for Visit/CC: Surgical Clearance.   HPI:  The patient is a 65 year old female, followed in the past by Dr. Stanford Breed, who presents to clinic today for surgical clearance. She is tentatively scheduled to undergo right total knee replacement for right knee OA, by Dr. Noemi Chapel. She was seen by Dr. Stanford Breed in 2014 for evaluation of chest pain. Per report, patient had a cardiac catheterization in 1999 because of palpitations that she states was normal. When seen by Dr. Stanford Breed in 2014, he outlined in his note that he felt that her chest discomfort was atypical. However he ordered for her to undergo a stress test to rule out ischemia. This a normal study. She has not followed up with Dr. Stanford Breed since. She also has a history of treated hypertension, HLD and DM which are all followed by her PCP. Her BP is well controlled today at 120/80. She notes her DM is also well controlled and she does not require insulin. EKG today shows normal sinus rhythm with T-wave abnormalities/TWIs in the inferior and lateral leads. This is new compared with prior EKGs. She denies CP or pressure, but does note substernal-left sided "indigestion" recently. Can occur at rest and not always in relation with meals. She also notes dyspnea walking up hills. She is currently CP free. She is also concerned regarding recent reports of a cardiac murmur that was picked up at orthopedic office. She denies syncope/ near syncope.    Current Outpatient Prescriptions  Medication Sig Dispense Refill  . atorvastatin (LIPITOR) 20 MG tablet Take 1 tablet daily and on Saturday take 2 tablets. (Patient taking differently: Take 20-40 mg by mouth daily at 6 PM. Take 1 tablet daily and on Saturday and Sunday take 2 tablets.) 105 tablet 2  . AVAPRO 150 MG tablet Take 0.5-1 tablets (75-150 mg total) by mouth daily.  (Patient taking differently: Take 75-150 mg by mouth at bedtime. ) 30 tablet 5  . calcium-vitamin D (OSCAL WITH D) 250-125 MG-UNIT per tablet Take 1 tablet by mouth daily.     . Cholecalciferol (VITAMIN D) 2000 UNITS CAPS Take 1 capsule by mouth every evening.     . Ciclopirox 1 % shampoo Apply 1 each topically every Friday.     . cycloSPORINE (RESTASIS) 0.05 % ophthalmic emulsion Place 1 drop into both eyes 2 (two) times daily.    . diclofenac sodium (VOLTAREN) 1 % GEL Apply 2 g topically daily. To knee if needed for pain.    Marland Kitchen diltiazem (TIAZAC) 360 MG 24 hr capsule Take 1 capsule (360 mg total) by mouth every morning. 90 capsule 3  . esomeprazole (NEXIUM) 40 MG capsule Take 1 capsule (40 mg total) by mouth every morning. 90 capsule 1  . fish oil-omega-3 fatty acids 1000 MG capsule Take 1,500 mg by mouth daily at 12 noon.     . gabapentin (NEURONTIN) 300 MG capsule Take 1 capsule (300 mg total) by mouth 3 (three) times daily. (Patient taking differently: Take 300 mg by mouth at bedtime. ) 270 capsule 1  . glucose blood (ONE TOUCH TEST STRIPS) test strip Use as directed twice daily to check blood sugar. DX code E11.9 300 each 1  . levocetirizine (XYZAL) 5 MG tablet Take 1 tablet (5 mg total) by mouth every evening. 90 tablet 0  . levothyroxine (SYNTHROID, LEVOTHROID) 75 MCG tablet Take one tablet Monday through Friday  and one half tablet Saturday and Sunday (Patient taking differently: Take 37.5-75 mcg by mouth daily before breakfast. Take one tablet Monday through Friday and one half tablet Saturday and Sunday) 90 tablet 1  . loteprednol (LOTEMAX) 0.5 % ophthalmic suspension Place 1 drop into the left eye daily as needed ("arthiritis of the eye").     . metFORMIN (GLUCOPHAGE-XR) 750 MG 24 hr tablet TAKE 1 TABLET IN AM AND 1 TABLET AT LUNCH (Patient taking differently: TAKE 1 TABLET IN AM AND 1 TABLET AT supper) 180 tablet 1  . metoprolol succinate (TOPROL-XL) 25 MG 24 hr tablet Take 25 mg by mouth 2  (two) times daily.     . mometasone (NASONEX) 50 MCG/ACT nasal spray Place 2 sprays into the nose daily as needed. (Patient taking differently: Place 1-2 sprays into the nose daily as needed (for congestion). ) 17 g 5  . polyvinyl alcohol-povidone (REFRESH) 1.4-0.6 % ophthalmic solution Place 1 drop into both eyes 2 (two) times daily.    . Probiotic Product (PROBIOTIC DAILY PO) Take 1 capsule by mouth daily.    . sodium chloride (OCEAN) 0.65 % SOLN nasal spray Place 1 spray into both nostrils daily as needed for congestion.    Marland Kitchen spironolactone (ALDACTONE) 25 MG tablet TAKE 1 TABLET BY MOUTH TWICE A DAY (Patient taking differently: TAKE 1.5 TABLET (37.5) BY MOUTH daily) 180 tablet 1  . vitamin E 1000 UNIT capsule Take 1,000 Units by mouth daily with lunch. Reported on 06/19/2015     No current facility-administered medications for this visit.    Allergies  Allergen Reactions  . Penicillins Itching  . Sulfa Antibiotics Itching  . Codeine Anxiety    Jittery   . Vicodin [Hydrocodone-Acetaminophen]     ITCHING     Social History   Social History  . Marital Status: Married    Spouse Name: N/A  . Number of Children: 3  . Years of Education: N/A   Occupational History  .      Retired   Social History Main Topics  . Smoking status: Never Smoker   . Smokeless tobacco: Never Used  . Alcohol Use: No  . Drug Use: No  . Sexual Activity: Yes    Birth Control/ Protection: Surgical     Comment: lives with husband, retired from various jobs, diabetic diet   Other Topics Concern  . Not on file   Social History Narrative     Review of Systems: General: negative for chills, fever, night sweats or weight changes.  Cardiovascular: negative for chest pain, dyspnea on exertion, edema, orthopnea, palpitations, paroxysmal nocturnal dyspnea or shortness of breath Dermatological: negative for rash Respiratory: negative for cough or wheezing Urologic: negative for hematuria Abdominal: negative  for nausea, vomiting, diarrhea, bright red blood per rectum, melena, or hematemesis Neurologic: negative for visual changes, syncope, or dizziness All other systems reviewed and are otherwise negative except as noted above.    Height 5\' 5"  (1.651 m), weight 178 lb 12.8 oz (81.103 kg).  General appearance: alert, cooperative and no distress Neck: no carotid bruit and no JVD Lungs: clear to auscultation bilaterally Heart: regular rate and rhythm and 1/6 SM LUSB Extremities: no LEE Pulses: 2+ and symmetric Skin: warm and dry Neurologic: Grossly normal  EKG NSR with diffuse mild TWIs in inferior and lateral leads, not seen on prior EKGs  ASSESSMENT AND PLAN:   1. Surgical Clearance: given patient's multiple cardiac risk factors + reports of indigestion like chest discomfort and  new TWIs in the inferolateral leads, not seen on prior EKGs, we will obtain a NST to r/o ischemia prior to surgery. We will order a Lexiscan chemical stress trest given she is limited physically from her OA. Given new cardiac murmur, we will also arrange for a 2D echo. Patient can be cleared for surgery if stress trest is negative for ischemia and if echo shows no significant valve dysfunction.   2. HTN: well controlled. Followed by PCP.   3. HLD: on statin therapy. Followed by PCP.   4. DM: patient reports this is controlled. Followed by PCP.   5. Right Knee OA: right TKR, if ischemic w/u is negative.    PLAN  Additional f/u to be determined based on ischemic w/u. If negative w/u, patient can be cleared for surgery and can f/u as needed.   Lyda Jester PA-C 06/26/2015 1:41 PM

## 2015-06-26 NOTE — Progress Notes (Signed)
Pre visit review using our clinic review tool, if applicable. No additional management support is needed unless otherwise documented below in the visit note. 

## 2015-06-27 ENCOUNTER — Telehealth (HOSPITAL_COMMUNITY): Payer: Self-pay | Admitting: *Deleted

## 2015-06-27 NOTE — Telephone Encounter (Signed)
Patient given detailed instructions per Myocardial Perfusion Study Information Sheet for the test on 06/28/15. Patient notified to arrive 15 minutes early and that it is imperative to arrive on time for appointment to keep from having the test rescheduled.  If you need to cancel or reschedule your appointment, please call the office within 24 hours of your appointment. Failure to do so may result in a cancellation of your appointment, and a $50 no show fee. Patient verbalized understanding.Hubbard Robinson, RN

## 2015-06-28 ENCOUNTER — Ambulatory Visit (HOSPITAL_COMMUNITY): Payer: Medicare HMO | Attending: Internal Medicine

## 2015-06-28 ENCOUNTER — Other Ambulatory Visit: Payer: Self-pay

## 2015-06-28 ENCOUNTER — Ambulatory Visit (HOSPITAL_BASED_OUTPATIENT_CLINIC_OR_DEPARTMENT_OTHER): Payer: Medicare HMO

## 2015-06-28 DIAGNOSIS — E785 Hyperlipidemia, unspecified: Secondary | ICD-10-CM | POA: Insufficient documentation

## 2015-06-28 DIAGNOSIS — R011 Cardiac murmur, unspecified: Secondary | ICD-10-CM | POA: Diagnosis not present

## 2015-06-28 DIAGNOSIS — R69 Illness, unspecified: Secondary | ICD-10-CM | POA: Diagnosis not present

## 2015-06-28 DIAGNOSIS — R9431 Abnormal electrocardiogram [ECG] [EKG]: Secondary | ICD-10-CM

## 2015-06-28 DIAGNOSIS — R0609 Other forms of dyspnea: Secondary | ICD-10-CM | POA: Insufficient documentation

## 2015-06-28 DIAGNOSIS — R079 Chest pain, unspecified: Secondary | ICD-10-CM

## 2015-06-28 DIAGNOSIS — Z01818 Encounter for other preprocedural examination: Secondary | ICD-10-CM | POA: Diagnosis not present

## 2015-06-28 DIAGNOSIS — I10 Essential (primary) hypertension: Secondary | ICD-10-CM | POA: Insufficient documentation

## 2015-06-28 DIAGNOSIS — E119 Type 2 diabetes mellitus without complications: Secondary | ICD-10-CM | POA: Diagnosis not present

## 2015-06-28 DIAGNOSIS — I119 Hypertensive heart disease without heart failure: Secondary | ICD-10-CM | POA: Insufficient documentation

## 2015-06-28 LAB — ECHOCARDIOGRAM COMPLETE
E decel time: 296 msec
FS: 41 % (ref 28–44)
IVS/LV PW RATIO, ED: 1.49
LA diam end sys: 40 cm
LV PW d: 6.97 mm — AB (ref 0.6–1.1)
LVOT area: 2.27 cm2
LVOT peak vel: 101 m/s
MV pk A vel: 70.8 m/s
MV pk E vel: 50 m/s
Stroke v: 49 ml
VTI: 21.4 cm

## 2015-06-28 LAB — MYOCARDIAL PERFUSION IMAGING
LV dias vol: 67 mL (ref 46–106)
LV sys vol: 16 mL
Peak HR: 95 {beats}/min
RATE: 0.2
Rest HR: 62 {beats}/min
SDS: 11
SRS: 7
SSS: 18
TID: 1.22

## 2015-06-28 MED ORDER — TECHNETIUM TC 99M SESTAMIBI GENERIC - CARDIOLITE
10.8000 | Freq: Once | INTRAVENOUS | Status: AC | PRN
  Administered 2015-06-28: 11 via INTRAVENOUS

## 2015-06-28 MED ORDER — REGADENOSON 0.4 MG/5ML IV SOLN
0.4000 mg | Freq: Once | INTRAVENOUS | Status: AC
  Administered 2015-06-28: 0.4 mg via INTRAVENOUS

## 2015-06-28 MED ORDER — TECHNETIUM TC 99M SESTAMIBI GENERIC - CARDIOLITE
33.0000 | Freq: Once | INTRAVENOUS | Status: AC | PRN
  Administered 2015-06-28: 33 via INTRAVENOUS

## 2015-06-29 MED ORDER — LACTATED RINGERS IV SOLN: INTRAVENOUS | Status: DC

## 2015-06-29 MED ORDER — VANCOMYCIN HCL IN DEXTROSE 1-5 GM/200ML-% IV SOLN
1000.0000 mg | INTRAVENOUS | Status: AC
  Administered 2015-07-02: 1000 mg via INTRAVENOUS
  Filled 2015-06-29: qty 200

## 2015-07-02 ENCOUNTER — Inpatient Hospital Stay (HOSPITAL_COMMUNITY): Payer: Medicare HMO | Admitting: Vascular Surgery

## 2015-07-02 ENCOUNTER — Encounter (HOSPITAL_COMMUNITY)
Admission: RE | Disposition: A | Discharge status: Skilled Nursing Facility | Payer: Self-pay | Source: Ambulatory Visit | Attending: Orthopedic Surgery

## 2015-07-02 ENCOUNTER — Encounter (HOSPITAL_COMMUNITY): Payer: Self-pay | Admitting: *Deleted

## 2015-07-02 ENCOUNTER — Inpatient Hospital Stay (HOSPITAL_COMMUNITY)
Admission: RE | Admit: 2015-07-02 | Discharge: 2015-07-04 | DRG: 470 | Disposition: A | Discharge status: Skilled Nursing Facility | Payer: Medicare HMO | Source: Ambulatory Visit | Attending: Orthopedic Surgery | Admitting: Orthopedic Surgery

## 2015-07-02 DIAGNOSIS — R9431 Abnormal electrocardiogram [ECG] [EKG]: Secondary | ICD-10-CM | POA: Diagnosis present

## 2015-07-02 DIAGNOSIS — E785 Hyperlipidemia, unspecified: Secondary | ICD-10-CM | POA: Diagnosis not present

## 2015-07-02 DIAGNOSIS — E039 Hypothyroidism, unspecified: Secondary | ICD-10-CM | POA: Diagnosis present

## 2015-07-02 DIAGNOSIS — K219 Gastro-esophageal reflux disease without esophagitis: Secondary | ICD-10-CM | POA: Diagnosis present

## 2015-07-02 DIAGNOSIS — E119 Type 2 diabetes mellitus without complications: Secondary | ICD-10-CM

## 2015-07-02 DIAGNOSIS — Z8619 Personal history of other infectious and parasitic diseases: Secondary | ICD-10-CM | POA: Diagnosis present

## 2015-07-02 DIAGNOSIS — Z853 Personal history of malignant neoplasm of breast: Secondary | ICD-10-CM | POA: Diagnosis not present

## 2015-07-02 DIAGNOSIS — M810 Age-related osteoporosis without current pathological fracture: Secondary | ICD-10-CM | POA: Diagnosis present

## 2015-07-02 DIAGNOSIS — Z780 Asymptomatic menopausal state: Secondary | ICD-10-CM

## 2015-07-02 DIAGNOSIS — E042 Nontoxic multinodular goiter: Secondary | ICD-10-CM | POA: Diagnosis not present

## 2015-07-02 DIAGNOSIS — M1711 Unilateral primary osteoarthritis, right knee: Secondary | ICD-10-CM | POA: Diagnosis not present

## 2015-07-02 DIAGNOSIS — M545 Low back pain: Secondary | ICD-10-CM | POA: Diagnosis present

## 2015-07-02 DIAGNOSIS — Z881 Allergy status to other antibiotic agents status: Secondary | ICD-10-CM

## 2015-07-02 DIAGNOSIS — Z880 Allergy status to penicillin: Secondary | ICD-10-CM | POA: Diagnosis not present

## 2015-07-02 DIAGNOSIS — E782 Mixed hyperlipidemia: Secondary | ICD-10-CM | POA: Diagnosis not present

## 2015-07-02 DIAGNOSIS — H40023 Open angle with borderline findings, high risk, bilateral: Secondary | ICD-10-CM | POA: Diagnosis present

## 2015-07-02 DIAGNOSIS — M549 Dorsalgia, unspecified: Secondary | ICD-10-CM | POA: Diagnosis not present

## 2015-07-02 DIAGNOSIS — Z8614 Personal history of Methicillin resistant Staphylococcus aureus infection: Secondary | ICD-10-CM | POA: Diagnosis not present

## 2015-07-02 DIAGNOSIS — M8580 Other specified disorders of bone density and structure, unspecified site: Secondary | ICD-10-CM | POA: Diagnosis not present

## 2015-07-02 DIAGNOSIS — Z9011 Acquired absence of right breast and nipple: Secondary | ICD-10-CM | POA: Diagnosis not present

## 2015-07-02 DIAGNOSIS — Z471 Aftercare following joint replacement surgery: Secondary | ICD-10-CM | POA: Diagnosis not present

## 2015-07-02 DIAGNOSIS — R109 Unspecified abdominal pain: Secondary | ICD-10-CM

## 2015-07-02 DIAGNOSIS — M1990 Unspecified osteoarthritis, unspecified site: Secondary | ICD-10-CM | POA: Diagnosis not present

## 2015-07-02 DIAGNOSIS — M179 Osteoarthritis of knee, unspecified: Secondary | ICD-10-CM | POA: Diagnosis not present

## 2015-07-02 DIAGNOSIS — I10 Essential (primary) hypertension: Secondary | ICD-10-CM | POA: Diagnosis present

## 2015-07-02 DIAGNOSIS — E1140 Type 2 diabetes mellitus with diabetic neuropathy, unspecified: Secondary | ICD-10-CM | POA: Diagnosis not present

## 2015-07-02 DIAGNOSIS — Z886 Allergy status to analgesic agent status: Secondary | ICD-10-CM

## 2015-07-02 DIAGNOSIS — C50919 Malignant neoplasm of unspecified site of unspecified female breast: Secondary | ICD-10-CM | POA: Diagnosis present

## 2015-07-02 DIAGNOSIS — I499 Cardiac arrhythmia, unspecified: Secondary | ICD-10-CM | POA: Diagnosis present

## 2015-07-02 DIAGNOSIS — M25569 Pain in unspecified knee: Secondary | ICD-10-CM | POA: Diagnosis not present

## 2015-07-02 DIAGNOSIS — Z96651 Presence of right artificial knee joint: Secondary | ICD-10-CM | POA: Diagnosis not present

## 2015-07-02 DIAGNOSIS — M25561 Pain in right knee: Secondary | ICD-10-CM | POA: Diagnosis present

## 2015-07-02 DIAGNOSIS — G8918 Other acute postprocedural pain: Secondary | ICD-10-CM | POA: Diagnosis not present

## 2015-07-02 DIAGNOSIS — M5431 Sciatica, right side: Secondary | ICD-10-CM | POA: Diagnosis present

## 2015-07-02 HISTORY — DX: Type 2 diabetes mellitus without complications: E11.9

## 2015-07-02 HISTORY — DX: Personal history of other medical treatment: Z92.89

## 2015-07-02 HISTORY — DX: Cardiac murmur, unspecified: R01.1

## 2015-07-02 HISTORY — PX: TOTAL KNEE ARTHROPLASTY: SHX125

## 2015-07-02 HISTORY — DX: Malignant neoplasm of unspecified site of right female breast: C50.911

## 2015-07-02 HISTORY — DX: Unilateral primary osteoarthritis, right knee: M17.11

## 2015-07-02 LAB — GLUCOSE, CAPILLARY
Glucose-Capillary: 97 mg/dL (ref 65–99)
Glucose-Capillary: 140 mg/dL — ABNORMAL HIGH (ref 65–99)
Glucose-Capillary: 207 mg/dL — ABNORMAL HIGH (ref 65–99)
Glucose-Capillary: 178 mg/dL — ABNORMAL HIGH (ref 65–99)

## 2015-07-02 SURGERY — ARTHROPLASTY, KNEE, TOTAL
Anesthesia: Regional | Laterality: Right

## 2015-07-02 MED ORDER — CHLORHEXIDINE GLUCONATE 4 % EX LIQD: 60.0000 mL | Freq: Once | CUTANEOUS | Status: DC

## 2015-07-02 MED ORDER — PHENYLEPHRINE HCL 10 MG/ML IJ SOLN
INTRAMUSCULAR | Status: DC | PRN
  Administered 2015-07-02 (×2): 120 ug via INTRAVENOUS
  Administered 2015-07-02: 80 ug via INTRAVENOUS

## 2015-07-02 MED ORDER — BUPIVACAINE-EPINEPHRINE (PF) 0.25% -1:200000 IJ SOLN
INTRAMUSCULAR | Status: AC
  Filled 2015-07-02: qty 30

## 2015-07-02 MED ORDER — ACETAMINOPHEN 325 MG PO TABS
ORAL_TABLET | ORAL | Status: AC
  Administered 2015-07-02: 650 mg via ORAL
  Filled 2015-07-02: qty 2

## 2015-07-02 MED ORDER — PROPOFOL 10 MG/ML IV BOLUS
INTRAVENOUS | Status: DC | PRN
  Administered 2015-07-02 (×2): 30 mg via INTRAVENOUS

## 2015-07-02 MED ORDER — MIDAZOLAM HCL 2 MG/2ML IJ SOLN
INTRAMUSCULAR | Status: DC | PRN
  Administered 2015-07-02 (×2): 1 mg via INTRAVENOUS

## 2015-07-02 MED ORDER — ARTIFICIAL TEARS OP OINT
TOPICAL_OINTMENT | OPHTHALMIC | Status: AC
  Filled 2015-07-02: qty 3.5

## 2015-07-02 MED ORDER — DEXAMETHASONE SODIUM PHOSPHATE 10 MG/ML IJ SOLN
10.0000 mg | Freq: Three times a day (TID) | INTRAMUSCULAR | Status: AC
  Administered 2015-07-02 – 2015-07-03 (×4): 10 mg via INTRAVENOUS
  Filled 2015-07-02 (×4): qty 1

## 2015-07-02 MED ORDER — BUPIVACAINE IN DEXTROSE 0.75-8.25 % IT SOLN
INTRATHECAL | Status: DC | PRN
  Administered 2015-07-02: 1.2 mL via INTRATHECAL

## 2015-07-02 MED ORDER — LEVOCETIRIZINE DIHYDROCHLORIDE 5 MG PO TABS: 5.0000 mg | ORAL_TABLET | Freq: Every evening | ORAL | Status: DC

## 2015-07-02 MED ORDER — CALCIUM CARBONATE-VITAMIN D 500-200 MG-UNIT PO TABS
1.0000 | ORAL_TABLET | Freq: Every day | ORAL | Status: DC
  Administered 2015-07-03 – 2015-07-04 (×2): 1 via ORAL
  Filled 2015-07-02 (×2): qty 1

## 2015-07-02 MED ORDER — BUPIVACAINE-EPINEPHRINE (PF) 0.5% -1:200000 IJ SOLN
INTRAMUSCULAR | Status: DC | PRN
  Administered 2015-07-02: 20 mL via PERINEURAL

## 2015-07-02 MED ORDER — POLYVINYL ALCOHOL 1.4 % OP SOLN
1.0000 [drp] | Freq: Two times a day (BID) | OPHTHALMIC | Status: DC
  Administered 2015-07-02 – 2015-07-04 (×4): 1 [drp] via OPHTHALMIC
  Filled 2015-07-02 (×2): qty 15

## 2015-07-02 MED ORDER — FENTANYL CITRATE (PF) 250 MCG/5ML IJ SOLN
INTRAMUSCULAR | Status: AC
  Filled 2015-07-02: qty 5

## 2015-07-02 MED ORDER — INSULIN ASPART 100 UNIT/ML ~~LOC~~ SOLN: 0.0000 [IU] | Freq: Every day | SUBCUTANEOUS | Status: DC

## 2015-07-02 MED ORDER — FENTANYL CITRATE (PF) 250 MCG/5ML IJ SOLN
INTRAMUSCULAR | Status: DC | PRN
  Administered 2015-07-02 (×2): 50 ug via INTRAVENOUS

## 2015-07-02 MED ORDER — RIVAROXABAN 10 MG PO TABS
10.0000 mg | ORAL_TABLET | Freq: Every day | ORAL | Status: DC
  Administered 2015-07-03 – 2015-07-04 (×2): 10 mg via ORAL
  Filled 2015-07-02 (×2): qty 1

## 2015-07-02 MED ORDER — ACETAMINOPHEN 650 MG RE SUPP: 650.0000 mg | Freq: Four times a day (QID) | RECTAL | Status: DC | PRN

## 2015-07-02 MED ORDER — SPIRONOLACTONE 50 MG PO TABS: 50.0000 mg | ORAL_TABLET | Freq: Every day | ORAL | Status: DC

## 2015-07-02 MED ORDER — PHENOL 1.4 % MT LIQD
1.0000 | OROMUCOSAL | Status: DC | PRN
Start: 2015-07-02 — End: 2015-07-04

## 2015-07-02 MED ORDER — IRBESARTAN 75 MG PO TABS
75.0000 mg | ORAL_TABLET | Freq: Every day | ORAL | Status: DC
  Filled 2015-07-02 (×2): qty 1

## 2015-07-02 MED ORDER — METOPROLOL SUCCINATE ER 25 MG PO TB24
25.0000 mg | ORAL_TABLET | Freq: Two times a day (BID) | ORAL | Status: DC
  Administered 2015-07-03 – 2015-07-04 (×3): 25 mg via ORAL
  Filled 2015-07-02 (×4): qty 1

## 2015-07-02 MED ORDER — SACCHAROMYCES BOULARDII 250 MG PO CAPS: 250.0000 mg | ORAL_CAPSULE | Freq: Two times a day (BID) | ORAL | Status: DC

## 2015-07-02 MED ORDER — FENTANYL CITRATE (PF) 100 MCG/2ML IJ SOLN
25.0000 ug | INTRAMUSCULAR | Status: DC | PRN
  Administered 2015-07-02: 25 ug via INTRAVENOUS

## 2015-07-02 MED ORDER — METOCLOPRAMIDE HCL 5 MG PO TABS
5.0000 mg | ORAL_TABLET | Freq: Three times a day (TID) | ORAL | Status: DC | PRN
  Administered 2015-07-02: 5 mg via ORAL
  Filled 2015-07-02: qty 1

## 2015-07-02 MED ORDER — DIPHENHYDRAMINE HCL 12.5 MG/5ML PO ELIX: 12.5000 mg | ORAL_SOLUTION | ORAL | Status: DC | PRN

## 2015-07-02 MED ORDER — POLYETHYLENE GLYCOL 3350 17 G PO PACK
17.0000 g | PACK | Freq: Two times a day (BID) | ORAL | Status: DC
  Administered 2015-07-02 – 2015-07-04 (×4): 17 g via ORAL
  Filled 2015-07-02 (×4): qty 1

## 2015-07-02 MED ORDER — PHENYLEPHRINE 40 MCG/ML (10ML) SYRINGE FOR IV PUSH (FOR BLOOD PRESSURE SUPPORT)
PREFILLED_SYRINGE | INTRAVENOUS | Status: AC
  Filled 2015-07-02: qty 10

## 2015-07-02 MED ORDER — PROPOFOL 500 MG/50ML IV EMUL
INTRAVENOUS | Status: DC | PRN
  Administered 2015-07-02: 25 ug/kg/min via INTRAVENOUS

## 2015-07-02 MED ORDER — GLYCOPYRROLATE 0.2 MG/ML IJ SOLN
INTRAMUSCULAR | Status: DC | PRN
  Administered 2015-07-02: 0.2 mg via INTRAVENOUS

## 2015-07-02 MED ORDER — ONDANSETRON HCL 4 MG/2ML IJ SOLN
INTRAMUSCULAR | Status: AC
  Filled 2015-07-02: qty 2

## 2015-07-02 MED ORDER — LACTATED RINGERS IV SOLN
INTRAVENOUS | Status: DC | PRN
  Administered 2015-07-02 (×2): via INTRAVENOUS

## 2015-07-02 MED ORDER — BUPIVACAINE-EPINEPHRINE 0.25% -1:200000 IJ SOLN
INTRAMUSCULAR | Status: DC | PRN
  Administered 2015-07-02: 30 mL

## 2015-07-02 MED ORDER — GLYCOPYRROLATE 0.2 MG/ML IJ SOLN
INTRAMUSCULAR | Status: AC
  Filled 2015-07-02: qty 1

## 2015-07-02 MED ORDER — PROPOFOL 10 MG/ML IV BOLUS
INTRAVENOUS | Status: AC
  Filled 2015-07-02: qty 20

## 2015-07-02 MED ORDER — PANTOPRAZOLE SODIUM 40 MG PO TBEC
40.0000 mg | DELAYED_RELEASE_TABLET | Freq: Every day | ORAL | Status: DC
  Administered 2015-07-03: 40 mg via ORAL
  Filled 2015-07-02 (×2): qty 1

## 2015-07-02 MED ORDER — MORPHINE SULFATE (PF) 2 MG/ML IV SOLN
2.0000 mg | INTRAVENOUS | Status: DC | PRN
  Administered 2015-07-02: 2 mg via INTRAVENOUS
  Filled 2015-07-02: qty 2
  Filled 2015-07-02: qty 1

## 2015-07-02 MED ORDER — SUCCINYLCHOLINE CHLORIDE 20 MG/ML IJ SOLN
INTRAMUSCULAR | Status: AC
  Filled 2015-07-02: qty 1

## 2015-07-02 MED ORDER — DILTIAZEM HCL ER BEADS 240 MG PO CP24
360.0000 mg | ORAL_CAPSULE | Freq: Every morning | ORAL | Status: DC
  Filled 2015-07-02: qty 1

## 2015-07-02 MED ORDER — VITAMIN D 50 MCG (2000 UT) PO CAPS: 1.0000 | ORAL_CAPSULE | Freq: Every evening | ORAL | Status: DC

## 2015-07-02 MED ORDER — DEXAMETHASONE SODIUM PHOSPHATE 10 MG/ML IJ SOLN
INTRAMUSCULAR | Status: DC | PRN
  Administered 2015-07-02: 10 mg via INTRAVENOUS

## 2015-07-02 MED ORDER — GABAPENTIN 300 MG PO CAPS
300.0000 mg | ORAL_CAPSULE | Freq: Every day | ORAL | Status: DC
  Administered 2015-07-02 – 2015-07-03 (×2): 300 mg via ORAL
  Filled 2015-07-02 (×2): qty 1

## 2015-07-02 MED ORDER — POVIDONE-IODINE 7.5 % EX SOLN
Freq: Once | CUTANEOUS | Status: DC
  Filled 2015-07-02: qty 118

## 2015-07-02 MED ORDER — CYCLOSPORINE 0.05 % OP EMUL
1.0000 [drp] | Freq: Two times a day (BID) | OPHTHALMIC | Status: DC
  Administered 2015-07-02 – 2015-07-04 (×4): 1 [drp] via OPHTHALMIC
  Filled 2015-07-02 (×4): qty 1

## 2015-07-02 MED ORDER — INSULIN ASPART 100 UNIT/ML ~~LOC~~ SOLN
0.0000 [IU] | Freq: Three times a day (TID) | SUBCUTANEOUS | Status: DC
  Administered 2015-07-02: 5 [IU] via SUBCUTANEOUS
  Administered 2015-07-03 – 2015-07-04 (×4): 3 [IU] via SUBCUTANEOUS

## 2015-07-02 MED ORDER — MEPERIDINE HCL 25 MG/ML IJ SOLN: 6.2500 mg | INTRAMUSCULAR | Status: DC | PRN

## 2015-07-02 MED ORDER — HYDROMORPHONE HCL 2 MG PO TABS
ORAL_TABLET | ORAL | Status: AC
  Administered 2015-07-02: 4 mg via ORAL
  Filled 2015-07-02: qty 2

## 2015-07-02 MED ORDER — MIDAZOLAM HCL 2 MG/2ML IJ SOLN
INTRAMUSCULAR | Status: AC
  Filled 2015-07-02: qty 2

## 2015-07-02 MED ORDER — INSULIN GLARGINE 100 UNIT/ML ~~LOC~~ SOLN
10.0000 [IU] | Freq: Every day | SUBCUTANEOUS | Status: DC
  Administered 2015-07-02 – 2015-07-03 (×2): 10 [IU] via SUBCUTANEOUS
  Filled 2015-07-02 (×3): qty 0.1

## 2015-07-02 MED ORDER — HYDROMORPHONE HCL 2 MG PO TABS
2.0000 mg | ORAL_TABLET | ORAL | Status: DC | PRN
  Administered 2015-07-02 (×3): 4 mg via ORAL
  Administered 2015-07-03: 2 mg via ORAL
  Administered 2015-07-03 (×3): 4 mg via ORAL
  Administered 2015-07-03 – 2015-07-04 (×3): 2 mg via ORAL
  Filled 2015-07-02: qty 1
  Filled 2015-07-02 (×3): qty 2
  Filled 2015-07-02: qty 1
  Filled 2015-07-02: qty 2
  Filled 2015-07-02: qty 1
  Filled 2015-07-02 (×2): qty 2
  Filled 2015-07-02: qty 1

## 2015-07-02 MED ORDER — MENTHOL 3 MG MT LOZG: 1.0000 | LOZENGE | OROMUCOSAL | Status: DC | PRN

## 2015-07-02 MED ORDER — SALINE SPRAY 0.65 % NA SOLN: 1.0000 | Freq: Every day | NASAL | Status: DC | PRN

## 2015-07-02 MED ORDER — POTASSIUM CHLORIDE IN NACL 20-0.9 MEQ/L-% IV SOLN
INTRAVENOUS | Status: DC
  Administered 2015-07-02 (×2): via INTRAVENOUS
  Filled 2015-07-02: qty 1000

## 2015-07-02 MED ORDER — FENTANYL CITRATE (PF) 100 MCG/2ML IJ SOLN
INTRAMUSCULAR | Status: AC
  Administered 2015-07-02: 25 ug via INTRAVENOUS
  Filled 2015-07-02: qty 2

## 2015-07-02 MED ORDER — LEVOTHYROXINE SODIUM 75 MCG PO TABS
75.0000 ug | ORAL_TABLET | Freq: Every day | ORAL | Status: DC
  Administered 2015-07-03 – 2015-07-04 (×2): 75 ug via ORAL
  Filled 2015-07-02 (×2): qty 1

## 2015-07-02 MED ORDER — VANCOMYCIN HCL IN DEXTROSE 1-5 GM/200ML-% IV SOLN
1000.0000 mg | Freq: Two times a day (BID) | INTRAVENOUS | Status: AC
  Administered 2015-07-02: 1000 mg via INTRAVENOUS
  Filled 2015-07-02: qty 200

## 2015-07-02 MED ORDER — PROMETHAZINE HCL 25 MG/ML IJ SOLN: 6.2500 mg | INTRAMUSCULAR | Status: DC | PRN

## 2015-07-02 MED ORDER — ONDANSETRON HCL 4 MG PO TABS: 4.0000 mg | ORAL_TABLET | Freq: Four times a day (QID) | ORAL | Status: DC | PRN

## 2015-07-02 MED ORDER — DOCUSATE SODIUM 100 MG PO CAPS
100.0000 mg | ORAL_CAPSULE | Freq: Two times a day (BID) | ORAL | Status: DC
  Administered 2015-07-02 – 2015-07-04 (×4): 100 mg via ORAL
  Filled 2015-07-02 (×4): qty 1

## 2015-07-02 MED ORDER — FLUTICASONE PROPIONATE 50 MCG/ACT NA SUSP
2.0000 | Freq: Every day | NASAL | Status: DC
  Administered 2015-07-04: 2 via NASAL
  Filled 2015-07-02: qty 16

## 2015-07-02 MED ORDER — LORATADINE 10 MG PO TABS
10.0000 mg | ORAL_TABLET | Freq: Every day | ORAL | Status: DC
  Administered 2015-07-03: 10 mg via ORAL
  Filled 2015-07-02 (×2): qty 1

## 2015-07-02 MED ORDER — METOCLOPRAMIDE HCL 5 MG/ML IJ SOLN: 5.0000 mg | Freq: Three times a day (TID) | INTRAMUSCULAR | Status: DC | PRN

## 2015-07-02 MED ORDER — SODIUM CHLORIDE 0.9 % IR SOLN
Status: DC | PRN
Start: 2015-07-02 — End: 2015-07-02
  Administered 2015-07-02: 3000 mL
  Administered 2015-07-02: 1000 mL

## 2015-07-02 MED ORDER — ALUM & MAG HYDROXIDE-SIMETH 200-200-20 MG/5ML PO SUSP: 30.0000 mL | ORAL | Status: DC | PRN

## 2015-07-02 MED ORDER — ONDANSETRON HCL 4 MG/2ML IJ SOLN: 4.0000 mg | Freq: Four times a day (QID) | INTRAMUSCULAR | Status: DC | PRN

## 2015-07-02 MED ORDER — LIDOCAINE HCL (CARDIAC) 20 MG/ML IV SOLN
INTRAVENOUS | Status: DC | PRN
  Administered 2015-07-02: 60 mg via INTRATRACHEAL

## 2015-07-02 MED ORDER — LOTEPREDNOL ETABONATE 0.5 % OP SUSP: 1.0000 [drp] | Freq: Every day | OPHTHALMIC | Status: DC | PRN

## 2015-07-02 MED ORDER — ACETAMINOPHEN 325 MG PO TABS
650.0000 mg | ORAL_TABLET | Freq: Four times a day (QID) | ORAL | Status: DC | PRN
  Administered 2015-07-02 (×3): 650 mg via ORAL
  Filled 2015-07-02 (×3): qty 2

## 2015-07-02 MED ORDER — ONDANSETRON HCL 4 MG/2ML IJ SOLN
INTRAMUSCULAR | Status: DC | PRN
  Administered 2015-07-02: 4 mg via INTRAVENOUS

## 2015-07-02 MED ORDER — DEXAMETHASONE SODIUM PHOSPHATE 10 MG/ML IJ SOLN
INTRAMUSCULAR | Status: AC
  Filled 2015-07-02: qty 1

## 2015-07-02 MED ORDER — LIDOCAINE HCL (CARDIAC) 20 MG/ML IV SOLN
INTRAVENOUS | Status: AC
  Filled 2015-07-02: qty 5

## 2015-07-02 MED ORDER — ATORVASTATIN CALCIUM 20 MG PO TABS
20.0000 mg | ORAL_TABLET | Freq: Every day | ORAL | Status: DC
Start: 2015-07-02 — End: 2015-07-04
  Administered 2015-07-02 – 2015-07-03 (×2): 20 mg via ORAL
  Filled 2015-07-02 (×3): qty 1

## 2015-07-02 MED ORDER — POLYVINYL ALCOHOL-POVIDONE 1.4-0.6 % OP SOLN: 1.0000 [drp] | Freq: Two times a day (BID) | OPHTHALMIC | Status: DC

## 2015-07-02 SURGICAL SUPPLY — 68 items
BANDAGE ACE 6X5 VEL STRL LF (GAUZE/BANDAGES/DRESSINGS) ×2 IMPLANT
BANDAGE ESMARK 6X9 LF (GAUZE/BANDAGES/DRESSINGS) ×1 IMPLANT
BENZOIN TINCTURE PRP APPL 2/3 (GAUZE/BANDAGES/DRESSINGS) ×2 IMPLANT
BLADE SAGITTAL 25.0X1.19X90 (BLADE) ×2 IMPLANT
BLADE SAW RECIP 87.9 MT (BLADE) ×2 IMPLANT
BLADE SAW SAG 73X25 THK (BLADE) ×1
BLADE SAW SAG 90X13X1.27 (BLADE) ×2 IMPLANT
BLADE SAW SGTL 73X25 THK (BLADE) ×1 IMPLANT
BLADE SURG 10 STRL SS (BLADE) ×6 IMPLANT
BNDG ELASTIC 6X15 VLCR STRL LF (GAUZE/BANDAGES/DRESSINGS) ×2 IMPLANT
BNDG ESMARK 6X9 LF (GAUZE/BANDAGES/DRESSINGS) ×2
BOWL SMART MIX CTS (DISPOSABLE) ×2 IMPLANT
CAPT KNEE TOTAL 3 ATTUNE ×2 IMPLANT
CEMENT HV SMART SET (Cement) ×4 IMPLANT
COVER SURGICAL LIGHT HANDLE (MISCELLANEOUS) ×2 IMPLANT
CUFF TOURNIQUET SINGLE 34IN LL (TOURNIQUET CUFF) ×2 IMPLANT
DRAPE EXTREMITY T 121X128X90 (DRAPE) ×2 IMPLANT
DRAPE INCISE IOBAN 66X45 STRL (DRAPES) ×2 IMPLANT
DRAPE PROXIMA HALF (DRAPES) ×2 IMPLANT
DRAPE U-SHAPE 47X51 STRL (DRAPES) ×2 IMPLANT
DRSG AQUACEL AG ADV 3.5X10 (GAUZE/BANDAGES/DRESSINGS) IMPLANT
DRSG AQUACEL AG ADV 3.5X14 (GAUZE/BANDAGES/DRESSINGS) ×2 IMPLANT
DURAPREP 26ML APPLICATOR (WOUND CARE) ×4 IMPLANT
ELECT CAUTERY BLADE 6.4 (BLADE) ×2 IMPLANT
ELECT REM PT RETURN 9FT ADLT (ELECTROSURGICAL) ×2
ELECTRODE REM PT RTRN 9FT ADLT (ELECTROSURGICAL) ×1 IMPLANT
FACESHIELD WRAPAROUND (MASK) ×2 IMPLANT
GLOVE BIO SURGEON STRL SZ7 (GLOVE) ×6 IMPLANT
GLOVE BIOGEL PI IND STRL 7.0 (GLOVE) ×1 IMPLANT
GLOVE BIOGEL PI IND STRL 7.5 (GLOVE) ×1 IMPLANT
GLOVE BIOGEL PI INDICATOR 7.0 (GLOVE) ×1
GLOVE BIOGEL PI INDICATOR 7.5 (GLOVE) ×1
GLOVE SS BIOGEL STRL SZ 7.5 (GLOVE) ×1 IMPLANT
GLOVE SUPERSENSE BIOGEL SZ 7.5 (GLOVE) ×1
GLOVE SURG ORTHO 7.0 STRL STRW (GLOVE) ×2 IMPLANT
GLOVE SURG SS PI 6.0 STRL IVOR (GLOVE) ×2 IMPLANT
GOWN STRL REUS W/ TWL LRG LVL3 (GOWN DISPOSABLE) ×1 IMPLANT
GOWN STRL REUS W/ TWL XL LVL3 (GOWN DISPOSABLE) ×2 IMPLANT
GOWN STRL REUS W/TWL LRG LVL3 (GOWN DISPOSABLE) ×1
GOWN STRL REUS W/TWL XL LVL3 (GOWN DISPOSABLE) ×2
HANDPIECE INTERPULSE COAX TIP (DISPOSABLE) ×1
HOOD PEEL AWAY FACE SHEILD DIS (HOOD) ×4 IMPLANT
IMMOBILIZER KNEE 22 UNIV (SOFTGOODS) ×2 IMPLANT
KIT BASIN OR (CUSTOM PROCEDURE TRAY) ×2 IMPLANT
KIT ROOM TURNOVER OR (KITS) ×2 IMPLANT
MANIFOLD NEPTUNE II (INSTRUMENTS) ×2 IMPLANT
MARKER SKIN DUAL TIP RULER LAB (MISCELLANEOUS) ×2 IMPLANT
NEEDLE 18GX1X1/2 (RX/OR ONLY) (NEEDLE) ×2 IMPLANT
NS IRRIG 1000ML POUR BTL (IV SOLUTION) ×2 IMPLANT
PACK TOTAL JOINT (CUSTOM PROCEDURE TRAY) ×2 IMPLANT
PAD ARMBOARD 7.5X6 YLW CONV (MISCELLANEOUS) ×4 IMPLANT
SET HNDPC FAN SPRY TIP SCT (DISPOSABLE) ×1 IMPLANT
STRIP CLOSURE SKIN 1/2X4 (GAUZE/BANDAGES/DRESSINGS) ×2 IMPLANT
SUCTION FRAZIER HANDLE 10FR (MISCELLANEOUS) ×1
SUCTION TUBE FRAZIER 10FR DISP (MISCELLANEOUS) ×1 IMPLANT
SUT MNCRL AB 3-0 PS2 18 (SUTURE) ×2 IMPLANT
SUT VIC AB 0 CT1 27 (SUTURE) ×3
SUT VIC AB 0 CT1 27XBRD ANBCTR (SUTURE) ×3 IMPLANT
SUT VIC AB 1 CT1 27 (SUTURE) ×1
SUT VIC AB 1 CT1 27XBRD ANBCTR (SUTURE) ×1 IMPLANT
SUT VIC AB 2-0 CT1 27 (SUTURE) ×3
SUT VIC AB 2-0 CT1 TAPERPNT 27 (SUTURE) ×3 IMPLANT
SYR 30ML LL (SYRINGE) ×2 IMPLANT
TOWEL OR 17X24 6PK STRL BLUE (TOWEL DISPOSABLE) ×2 IMPLANT
TOWEL OR 17X26 10 PK STRL BLUE (TOWEL DISPOSABLE) ×2 IMPLANT
TRAY FOLEY CATH 16FR SILVER (SET/KITS/TRAYS/PACK) ×2 IMPLANT
TUBE CONNECTING 12X1/4 (SUCTIONS) ×2 IMPLANT
YANKAUER SUCT BULB TIP NO VENT (SUCTIONS) ×2 IMPLANT

## 2015-07-02 NOTE — Progress Notes (Signed)
Orthopedic Tech Progress Note Patient Details:  Victoria Walker 1950-09-06 [id number]  CPM Right Knee CPM Right Knee: On Right Knee Flexion (Degrees): 90 Right Knee Extension (Degrees): 0 Additional Comments: trapeze bar pa5tient helper Viewed order from doctor's order list  Hildred Priest 07/02/2015, 10:03 AM

## 2015-07-02 NOTE — Anesthesia Preprocedure Evaluation (Addendum)
Anesthesia Evaluation  Patient identified by MRN, date of birth, ID band Patient awake    Reviewed: Allergy & Precautions, NPO status , Patient's Chart, lab work & pertinent test results  Airway Mallampati: II  TM Distance: >3 FB Neck ROM: Full    Dental no notable dental hx.    Pulmonary neg pulmonary ROS,    Pulmonary exam normal breath sounds clear to auscultation       Cardiovascular hypertension, Pt. on medications Normal cardiovascular exam Rhythm:Regular Rate:Normal     Neuro/Psych negative neurological ROS  negative psych ROS   GI/Hepatic negative GI ROS, Neg liver ROS,   Endo/Other  diabetes, Type 2, Oral Hypoglycemic Agents  Renal/GU negative Renal ROS  negative genitourinary   Musculoskeletal negative musculoskeletal ROS (+)   Abdominal   Peds negative pediatric ROS (+)  Hematology negative hematology ROS (+)   Anesthesia Other Findings   Reproductive/Obstetrics negative OB ROS                            Anesthesia Physical Anesthesia Plan  ASA: II  Anesthesia Plan: Spinal and Regional   Post-op Pain Management:    Induction:   Airway Management Planned: Simple Face Mask  Additional Equipment:   Intra-op Plan:   Post-operative Plan:   Informed Consent: I have reviewed the patients History and Physical, chart, labs and discussed the procedure including the risks, benefits and alternatives for the proposed anesthesia with the patient or authorized representative who has indicated his/her understanding and acceptance.   Dental advisory given  Plan Discussed with: CRNA  Anesthesia Plan Comments:         Anesthesia Quick Evaluation

## 2015-07-02 NOTE — Op Note (Signed)
MRN:     [id number] DOB/AGE:    1950-11-02 / 65 y.o.       OPERATIVE REPORT    DATE OF PROCEDURE:  07/02/2015       PREOPERATIVE DIAGNOSIS:   Primary localized OA right knee      Estimated body mass index is 29.62 kg/(m^2) as calculated from the following:   Height as of this encounter: 5\' 5"  (1.651 m).   Weight as of this encounter: 80.74 kg (178 lb).                                                        POSTOPERATIVE DIAGNOSIS:   same                                                                      PROCEDURE:  Procedure(s): TOTAL KNEE ARTHROPLASTY Using Depuy Attune RP implants #5 Femur, #5Tibia, 92mm  RP bearing, 29 Patella     SURGEON: Ethne Jeon A    ASSISTANT:  Kirstin Shepperson PA-C   (Present and scrubbed throughout the case, critical for assistance with exposure, retraction, instrumentation, and closure.)         ANESTHESIA: Spinal with Adductor Nerve Block     TOURNIQUET TIME: [id number]   COMPLICATIONS:  None     SPECIMENS: None   INDICATIONS FOR PROCEDURE: The patient has  djd right knee, varus deformities, XR shows bone on bone arthritis. Patient has failed all conservative measures including anti-inflammatory medicines, narcotics, attempts at  exercise and weight loss, cortisone injections and viscosupplementation.  Risks and benefits of surgery have been discussed, questions answered.   DESCRIPTION OF PROCEDURE: The patient identified by armband, received  right femoral nerve block and IV antibiotics, in the holding area at [facility]. Patient taken to the operating room, appropriate anesthetic  monitors were attached General endotracheal anesthesia induced with  the patient in supine position, Foley catheter was inserted. Tourniquet  applied high to the operative thigh. Lateral post and foot positioner  applied to the table, the lower extremity was then prepped and draped  in usual sterile fashion from the ankle to the tourniquet. Time-out procedure was  performed. The limb was wrapped with an Esmarch bandage and the tourniquet inflated to 365 mmHg. We began the operation by making the anterior midline incision starting at handbreadth above the patella going over the patella 1 cm medial to and  4 cm distal to the tibial tubercle. Small bleeders in the skin and the  subcutaneous tissue identified and cauterized. Transverse retinaculum was incised and reflected medially and a medial parapatellar arthrotomy was accomplished. the patella was everted and theprepatellar fat pad resected. The superficial medial collateral  ligament was then elevated from anterior to posterior along the proximal  flare of the tibia and anterior half of the menisci resected. The knee was hyperflexed exposing bone on bone arthritis. Peripheral and notch osteophytes as well as the cruciate ligaments were then resected. We continued to  work our way around posteriorly along the proximal tibia, and externally  rotated the tibia subluxing it  out from underneath the femur. A McHale  retractor was placed through the notch and a lateral Hohmann retractor  placed, and we then drilled through the proximal tibia in line with the  axis of the tibia followed by an intramedullary guide rod and 2-degree  posterior slope cutting guide. The tibial cutting guide was pinned into place  allowing resection of 4 mm of bone medially and about 6 mm of bone  laterally because of her varus deformity. Satisfied with the tibial resection, we then  entered the distal femur 2 mm anterior to the PCL origin with the  intramedullary guide rod and applied the distal femoral cutting guide  set at 63mm, with 5 degrees of valgus. This was pinned along the  epicondylar axis. At this point, the distal femoral cut was accomplished without difficulty. We then sized for a #5 femoral component and pinned the guide in 3 degrees of external rotation.The chamfer cutting guide was pinned into place. The anterior,  posterior, and chamfer cuts were accomplished without difficulty followed by  the  RP box cutting guide and the box cut. We also removed posterior osteophytes from the posterior femoral condyles. At this  time, the knee was brought into full extension. We checked our  extension and flexion gaps and found them symmetric at 96mm.  The patella thickness measured at 25 mm. We set the cutting guide at 15 and removed the posterior 9.5-10 mm  of the patella sized for 29 button and drilled the lollipop. The knee  was then once again hyperflexed exposing the proximal tibia. We sized for a #5 tibial base plate, applied the smokestack and the conical reamer followed by the the Delta fin keel punch. We then hammered into place the  RP trial femoral component, inserted a 1 trial bearing, trial patellar button, and took the knee through range of motion from 0-130 degrees. No thumb pressure was required for patellar  tracking. At this point, all trial components were removed, a double batch of DePuy HV cement  was mixed and applied to all bony metallic mating surfaces except for the posterior condyles of the femur itself. In order, we  hammered into place the tibial tray and removed excess cement, the femoral component and removed excess cement, a 13mm  RP bearing  was inserted, and the knee brought to full extension with compression.  The patellar button was clamped into place, and excess cement  removed. While the cement cured the wound was irrigated out with normal saline solution pulse lavage.. Ligament stability and patellar tracking were checked and found to be excellent.. The parapatellar arthrotomy was closed with  #1 Vicryl suture. The subcutaneous tissue with 0 and 2-0 undyed  Vicryl suture, and 4-0 Monocryl.. A dressing of Aquaseal,  4 x 4, dressing sponges, Webril, and Ace wrap applied. Needle and sponge count were correct times 2.The patient awakened, extubated, and taken to recovery room without  difficulty. Vascular status was normal, pulses 2+ and symmetric.   Victoria Walker A 07/02/2015, 8:55 AM

## 2015-07-02 NOTE — NC FL2 (Signed)
[place] LEVEL OF CARE SCREENING TOOL     IDENTIFICATION  Patient Name: Victoria Walker Birthdate: [birth year] Sex: female Admission Date (Current Location): 07/02/2015  [facility] and [place] Number:  Herbalist and Address:  [facility], [place] [address], [place], [place] [address]      Provider Number: [id number]  Attending Physician Name and Address:  Elsie Saas, MD  Relative Name and Phone Number:  Jos[place], [place] [phone] or [phone]    Current Level of Care: Hospital Recommended Level of Care: [place] Prior Approval Number:    Date Approved/Denied:   PASRR Number: [id number]  Discharge Plan: SNF    Current Diagnoses: Patient Active Problem List   Diagnosis Date Noted  . Primary localized osteoarthritis of right knee   . History of MRSA infection   . History of measles   . Dysrhythmia   . Chronic rhinitis 06/06/2015  . Shortness of breath 06/06/2015  . Vaginitis and vulvovaginitis 04/25/2015  . Lumbar back pain 04/25/2015  . Medicare annual wellness visit, initial 04/07/2015  . Allergic response 01/01/2015  . History of chickenpox 09/26/2014  . History of shingles   . Diabetes mellitus without complication ([place]) 123XX123  . 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 Deformity of metatarsal bone of left foot 03/03/2014  . Vulvar irritation  S/P neg vulvar bx 12/2013 01/23/2014  . 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 Right knee DJD 07/12/2013  . Neuralgia of left foot 06/24/2013  . Tarsal tunnel syndrome 04/08/2013  . Vaginal dryness, menopausal 04/05/2013  . Pronation deformity of ankle, acquired 02/09/2013  . Cortical cataract 02/01/2013  . H/O bone density study 01/26/2013  . Chest pain 01/05/2013  . History of iritis 01/04/2013  . Dry eye syndrome 10/07/2012  . Meibomian gland disease 10/07/2012  . GERD (gastroesophageal  reflux disease) 10/05/2012  . Essential hypertension, benign 10/04/2012  . Hyperlipidemia, mixed 10/04/2012  . 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 Diabetic neuropathy ([place]) 10/04/2012  . S/P hysterectomy 10/04/2012  . Osteopenia 10/04/2012  . Hypothyroidism 10/04/2012  . History of cystocele 10/04/2012  . Dyspareunia in female 10/04/2012  . Plantar fasciitis 10/04/2012  . Nuclear sclerotic cataract 06/17/2012  . Diabetes mellitus ([place]) 03/02/2012  . BP (high blood pressure) 01/06/2012  . Iritis 01/06/2012  . Neuropathy ([place]) 01/06/2012  . Disease of thyroid gland 01/06/2012  . Breast cancer ([place]) 05/06/2011    Orientation RESPIRATION BLADDER Height & Weight     Self, Situation, Time, Place  Normal Continent Weight: 178 lb (80.74 kg) Height:  5\' 5"  (165.1 cm)  BEHAVIORAL SYMPTOMS/MOOD NEUROLOGICAL BOWEL NUTRITION STATUS      Continent Diet (Regular diet)  AMBULATORY STATUS COMMUNICATION OF NEEDS Skin   Limited Assist Verbally Surgical wounds                       Personal Care Assistance Level of Assistance  Bathing, Dressing Bathing Assistance: Limited assistance   Dressing Assistance: Limited assistance     Functional Limitations Info             SPECIAL CARE FACTORS FREQUENCY  PT (By licensed PT)     PT Frequency: 5x a week              Contractures      Additional Factors Info  Allergies, Insulin Sliding Scale   Allergies Info: CODEINE, PENICILLINS, SULFA ANTIBIOTICS, VICODIN  Insulin Sliding Scale Info: 3x a day       Current Medications (07/02/2015):  This is the current hospital active medication list Current Facility-Administered Medications  Medication Dose Route Frequency Provider Last Rate Last Dose  . 0.9 % NaCl with KCl 20 mEq/ L  infusion   Intravenous Continuous Kirstin Shepperson, PA-C   Stopped at 07/02/15 1706  . acetaminophen (TYLENOL) tablet 650 mg  650 mg Oral Q6H PRN Kirstin Shepperson, PA-C   650 mg at 07/02/15 1712   Or   . acetaminophen (TYLENOL) suppository 650 mg  650 mg Rectal Q6H PRN Kirstin Shepperson, PA-C      . alum & mag hydroxide-simeth (MAALOX/MYLANTA) 200-200-20 MG/5ML suspension 30 mL  30 mL Oral Q4H PRN Kirstin Shepperson, PA-C      . atorvastatin (LIPITOR) tablet 20 mg  20 mg Oral q1800 Kirstin Shepperson, PA-C   20 mg at 07/02/15 1716  . [START ON 07/03/2015] calcium-vitamin D (OSCAL WITH D) 500-200 MG-UNIT per tablet 1 tablet  1 tablet Oral Q breakfast Kirstin Shepperson, PA-C      . cycloSPORINE (RESTASIS) 0.05 % ophthalmic emulsion 1 drop  1 drop Both Eyes BID Kirstin Shepperson, PA-C      . dexamethasone (DECADRON) injection 10 mg  10 mg Intravenous Q8H Kirstin Shepperson, PA-C   10 mg at 07/02/15 1716  . [START ON 07/03/2015] diltiazem (TIAZAC) 24 hr capsule 360 mg  360 mg Oral q morning - 10a Kirstin Shepperson, PA-C      . diphenhydrAMINE (BENADRYL) 12.5 MG/5ML elixir 12.5-25 mg  12.5-25 mg Oral Q4H PRN Kirstin Shepperson, PA-C      . docusate sodium (COLACE) capsule 100 mg  100 mg Oral BID Kirstin Shepperson, PA-C      . [START ON 07/03/2015] fluticasone (FLONASE) 50 MCG/ACT nasal spray 2 spray  2 spray Each Nare Daily Kirstin Shepperson, PA-C      . gabapentin (NEURONTIN) capsule 300 mg  300 mg Oral QHS Kirstin Shepperson, PA-C      . HYDROmorphone (DILAUDID) tablet 2-4 mg  2-4 mg Oral Q3H PRN Kirstin Shepperson, PA-C   4 mg at 07/02/15 1712  . insulin aspart (novoLOG) injection 0-15 Units  0-15 Units Subcutaneous TID WC Kirstin Shepperson, PA-C   5 Units at 07/02/15 1716  . insulin aspart (novoLOG) injection 0-5 Units  0-5 Units Subcutaneous QHS Kirstin Shepperson, PA-C      . insulin glargine (LANTUS) injection 10 Units  10 Units Subcutaneous QHS Kirstin Shepperson, PA-C      . [START ON 07/04/2015] irbesartan (AVAPRO) tablet 75 mg  75 mg Oral Daily Kirstin Shepperson, PA-C      . levothyroxine (SYNTHROID, LEVOTHROID) tablet 75 mcg  75 mcg Oral QAC breakfast Kirstin Shepperson, PA-C   75 mcg  at 07/02/15 1322  . [START ON 07/03/2015] loratadine (CLARITIN) tablet 10 mg  10 mg Oral Daily Elsie Saas, MD      . menthol-cetylpyridinium (CEPACOL) lozenge 3 mg  1 lozenge Oral PRN Kirstin Shepperson, PA-C       Or  . phenol (CHLORASEPTIC) mouth spray 1 spray  1 spray Mouth/Throat PRN Kirstin Shepperson, PA-C      . metoCLOPramide (REGLAN) tablet 5-10 mg  5-10 mg Oral Q8H PRN Kirstin Shepperson, PA-C   5 mg at 07/02/15 1346   Or  . metoCLOPramide (REGLAN) injection 5-10 mg  5-10 mg Intravenous Q8H PRN Kirstin Shepperson, PA-C      . metoprolol succinate (TOPROL-XL) 24 hr tablet 25 mg  25 mg Oral BID Kirstin Shepperson, PA-C      . morphine 2 MG/ML injection 2-4 mg  2-4 mg Intravenous Q2H PRN Kirstin Shepperson, PA-C      . ondansetron (ZOFRAN) tablet 4 mg  4 mg Oral Q6H PRN Kirstin Shepperson, PA-C       Or  . ondansetron (ZOFRAN) injection 4 mg  4 mg Intravenous Q6H PRN Kirstin Shepperson, PA-C      . [START ON 07/03/2015] pantoprazole (PROTONIX) EC tablet 40 mg  40 mg Oral Daily Kirstin Shepperson, PA-C      . polyethylene glycol (MIRALAX / GLYCOLAX) packet 17 g  17 g Oral BID Kirstin Shepperson, PA-C      . polyvinyl alcohol (LIQUIFILM TEARS) 1.4 % ophthalmic solution 1 drop  1 drop Both Eyes BID Elsie Saas, MD      . Derrill Memo ON 07/03/2015] rivaroxaban (XARELTO) tablet 10 mg  10 mg Oral Q breakfast Kirstin Shepperson, PA-C      . [START ON 07/04/2015] spironolactone (ALDACTONE) tablet 50 mg  50 mg Oral Daily Kirstin Shepperson, PA-C      . vancomycin (VANCOCIN) IVPB 1000 mg/200 mL premix  1,000 mg Intravenous Q12H Kirstin Shepperson, PA-C         Discharge Medications: Please see discharge summary for a list of discharge medications.  Relevant Imaging Results:  Relevant Lab Results:   Additional Information SSN [id number]  Ross Ludwig, [place]

## 2015-07-02 NOTE — Clinical Social Work Note (Signed)
CSW met with patient and her husband to discuss SNF placement.  Patient is pre registered at [facility] and she plans to go to SNF for short term rehab.  Patient expressed that she toured SNF and was very pleased with it.  CSW spoke to [place] who confirmed that patient has pre registered at [facility].  CSW was given permission by patient to fax clinicals to [facility].  Jones Broom. [place], MSW, [place] 07/02/2015 5:52 PM

## 2015-07-02 NOTE — Interval H&P Note (Signed)
History and Physical Interval Note:  07/02/2015 7:02 AM  [name]  has presented today for surgery, with the diagnosis of Primary localized OA right knee  The various methods of treatment have been discussed with the patient and family. After consideration of risks, benefits and other options for treatment, the patient has consented to  Procedure(s): TOTAL KNEE ARTHROPLASTY (Right) as Walker surgical intervention .  The patient's history has been reviewed, patient examined, no change in status, stable for surgery.  I have reviewed the patient's chart and labs.  Questions were answered to the patient's satisfaction.     Victoria Walker

## 2015-07-02 NOTE — Evaluation (Signed)
Physical Therapy Evaluation Patient Details Name: Victoria Walker MRN: [id number] DOB: 10/02/1950 Today's Date: 07/02/2015   History of Present Illness  65 yo admitted for R TKA. PMHx: HTN  Clinical Impression  Pt moving well able to tolerate limited gait, HEP and functional mobility. Pt with decreased strength, ROM, transfers and gait who will benefit from acute therapy to maximize mobility, function and gait to decrease burden of care. Pt requests D/C to ST-SNF with plans for [facility]. Pt educated for transfers, gait, DME use, CPM use and bone foam (in use end of session).     Follow Up Recommendations SNF (per pt requested plan pre-op)    Equipment Recommendations  Rolling walker with 5" wheels;3in1 (PT)    Recommendations for Other Services OT consult     Precautions / Restrictions Precautions Precautions: Fall;Knee Required Braces or Orthoses: Knee Immobilizer - Right Knee Immobilizer - Right: On when out of bed or walking;Discontinue once straight leg raise with < 10 degree lag Restrictions Weight Bearing Restrictions: Yes RLE Weight Bearing: Weight bearing as tolerated      Mobility  Bed Mobility Overal bed mobility: Needs Assistance Bed Mobility: Supine to Sit     Supine to sit: Supervision     General bed mobility comments: cues to use LLE to assist RLE to EOB with HOB 25degrees  Transfers Overall transfer level: Needs assistance   Transfers: Sit to/from Stand Sit to Stand: Min guard         General transfer comment: cues for hand placement and safety  Ambulation/Gait Ambulation/Gait assistance: Min guard Ambulation Distance (Feet): 60 Feet Assistive device: Rolling walker (2 wheeled) Gait Pattern/deviations: Step-to pattern;Decreased stride length   Gait velocity interpretation: Below normal speed for age/gender General Gait Details: cues for sequence, RW use and safety  Stairs            Wheelchair Mobility    Modified Rankin (Stroke  Patients Only)       Balance                                             Pertinent Vitals/Pain Pain Assessment: 0-10 Pain Score: 3  Pain Location: right knee Pain Descriptors / Indicators: Aching Pain Intervention(s): Limited activity within patient's tolerance;Monitored during session;Premedicated before session;Repositioned    Home Living Family/patient expects to be discharged to:: Private residence Living Arrangements: Spouse/significant other Available Help at Discharge: Family;Available 24 hours/day Type of Home: House Home Access: Stairs to enter Entrance Stairs-Rails: None Entrance Stairs-Number of Steps: 3 Home Layout: One level;Laundry or work area in [employer]: None      Prior Function Level of Independence: Independent               Journalist, newspaper        Extremity/Trunk Assessment   Upper Extremity Assessment: Overall WFL for tasks assessed           Lower Extremity Assessment: RLE deficits/detail RLE Deficits / Details: decreased strength and ROM as expected post op    Cervical / Trunk Assessment: Normal  Communication   Communication: No difficulties  Cognition Arousal/Alertness: Awake/alert Behavior During Therapy: WFL for tasks assessed/performed Overall Cognitive Status: Within Functional Limits for tasks assessed                      General Comments      Exercises  Total Joint Exercises Quad Sets: AROM;Right;10 reps;Supine Heel Slides: AROM;Right;10 reps;Supine Straight Leg Raises: AROM;Right;5 reps;Supine      Assessment/Plan    PT Assessment Patient needs continued PT services  PT Diagnosis Difficulty walking;Acute pain   PT Problem List Decreased strength;Decreased range of motion;Decreased activity tolerance;Decreased mobility;Pain;Decreased knowledge of use of DME  PT Treatment Interventions Functional mobility training;Therapeutic activities;Therapeutic  exercise;Patient/family education;Gait training;Stair training;DME instruction   PT Goals (Current goals can be found in the Care Plan section) Acute Rehab PT Goals Patient Stated Goal: be able to care for myself PT Goal Formulation: With patient Time For Goal Achievement: 07/09/15 Potential to Achieve Goals: Good    Frequency 7X/week   Barriers to discharge Decreased caregiver support pt reports spouse cannot provide sufficient assist at D/C and prefers SNF    Co-evaluation               End of Session Equipment Utilized During Treatment: Gait belt Activity Tolerance: Patient tolerated treatment well Patient left: in chair;with call bell/phone within reach;Other (comment) (in bone foam) Nurse Communication: Mobility status         Time: [id number] PT Time Calculation (min) (ACUTE ONLY): 31 min   Charges:   PT Evaluation $PT Eval Moderate Complexity: 1 Procedure PT Treatments $Gait Training: 8-22 mins   PT G Codes:        Melford Aase 07/02/2015, 2:30 PM Elwyn Reach, [place]

## 2015-07-02 NOTE — Anesthesia Postprocedure Evaluation (Signed)
Anesthesia Post Note  Patient: Victoria Walker  Procedure(s) Performed: Procedure(s) (LRB): TOTAL KNEE ARTHROPLASTY (Right)  Patient location during evaluation: PACU Anesthesia Type: Spinal Level of consciousness: awake and alert Pain management: pain level controlled Vital Signs Assessment: post-procedure vital signs reviewed and stable Respiratory status: spontaneous breathing, nonlabored ventilation, respiratory function stable and patient connected to nasal cannula oxygen Cardiovascular status: blood pressure returned to baseline and stable Postop Assessment: no signs of nausea or vomiting Anesthetic complications: no    Last Vitals:  Filed Vitals:   07/02/15 1030 07/02/15 1045  BP: 113/66   Pulse: 46 59  Temp: 36.7 C   Resp: 18 20    Last Pain:  Filed Vitals:   07/02/15 1049  PainSc: 0-No pain                 Montez Hageman

## 2015-07-02 NOTE — Progress Notes (Signed)
Utilization review completed.  

## 2015-07-02 NOTE — Clinical Social Work Note (Signed)
Clinical Social Work Assessment  Patient Details  Name: Victoria Walker MRN: [id number] Date of Birth: [birth year]  Date of referral:  07/02/15               Reason for consult:  Facility Placement                Permission sought to share information with:  Family Supports, Customer service manager Permission granted to share information::  Yes, Verbal Permission Granted  Name::     Victoria, Walker [phone] or [phone]  Agency::  SNF admissions  Relationship::     Contact Information:     Housing/Transportation Living arrangements for the past 2 months:  Single Family Home Source of Information:  Patient Patient Interpreter Needed:  None Criminal Activity/Legal Involvement Pertinent to Current Situation/Hospitalization:  No - Comment as needed Significant Relationships:  Spouse Lives with:  Spouse Do you feel safe going back to the place where you live?  No (Patient feel she needs some short term rehab before she can return back home.) Need for family participation in patient care:  No (Coment)  Care giving concerns:  Patient feel she needs some short term rehab before she is able to return back home.   Social Worker assessment / plan:  Patient is a 65 year old female who is married and lives with her husband.  Patient is alert and oriented x4 and able to express her feelings and concerns.  Patient states she is pre registered at [facility], but has not been to rehab in the past.  CSW explained to patient and family what to expect when discharging to SNF.  CSW explained to patient what to expect day of discharge from hospital, and what the SNF will received in order to accept patient.  Patient was explained role of CSW and what to expect while she is at hospital and SNF.  Patient stated she toured [facility] and pre registered, and felt good about discharging to SNF.  Patient expressed that she did not have any other questions at this time, CSW left contact  information in case patient has a question.  Employment status:  Retired Nurse, adult PT Recommendations:  [place] / Referral to community resources:     Patient/Family's Response to care:  Patient and family agreeable to going to SNF for short term rehab.  Patient/Family's Understanding of and Emotional Response to Diagnosis, Current Treatment, and Prognosis:  Patient and family express understanding of diagnosis and current treatment plan.  Emotional Assessment Appearance:  Appears stated age Attitude/Demeanor/Rapport:    Affect (typically observed):  Appropriate, Stable, Calm Orientation:  Oriented to Self, Oriented to Place, Oriented to  Time, Oriented to Situation Alcohol / Substance use:  Not Applicable Psych involvement (Current and /or in the community):  No (Comment)  Discharge Needs  Concerns to be addressed:  No discharge needs identified Readmission within the last 30 days:  No Current discharge risk:  None Barriers to Discharge:  No Barriers Identified   Ross Ludwig, LCSWA 07/02/2015, 6:06 PM

## 2015-07-02 NOTE — Progress Notes (Signed)
OT Cancellation Note  Patient Details Name: Nestora Innamorato MRN: [id number] DOB: 08-12-1950   Cancelled Treatment:    Reason Eval/Treat Not Completed: Other (comment) D/C plan is SNF. No apparent immediate acute care OT needs, therefore will defer OT to SNF. If OT eval is needed please call Acute Rehab Dept. at [phone] or text page OT at number below. Thanks.  [place], OTR/L  [id number] 07/02/2015 07/02/2015, 10:01 PM

## 2015-07-02 NOTE — Clinical Social Work Placement (Addendum)
   CLINICAL SOCIAL WORK PLACEMENT  NOTE  Date:  07/02/2015  Patient Details  Name: Victoria Walker MRN: [id number] Date of Birth: [birth year]  Clinical Social Work is seeking post-discharge placement for this patient at the [place] level of care (*CSW will initial, date and re-position this form in  chart as items are completed):  Yes   Patient/family provided with [facility] Work Department's list of facilities offering this level of care within the geographic area requested by the patient (or if unable, by the patient's family).  Yes   Patient/family informed of their freedom to choose among providers that offer the needed level of care, that participate in Medicare, Medicaid or managed care program needed by the patient, have an available bed and are willing to accept the patient.  Yes   Patient/family informed of [facility]'s ownership interest in [facility] and [facility], as well as of the fact that they are under no obligation to receive care at these facilities.  PASRR submitted to EDS on 07/02/15     PASRR number received on 07/02/15     Existing PASRR number confirmed on       FL2 transmitted to all facilities in geographic area requested by pt/family on 07/02/15     FL2 transmitted to all facilities within larger geographic area on       Patient informed that his/her managed care company has contracts with or will negotiate with certain facilities, including the following:         07/02/15   Patient/family informed of bed offers received. (Updated Evette Cristal, MSW, [place], 07-04-15)  Patient chooses bed at  [facility] (Updated Evette Cristal, MSW, [place], 07-04-15)     Physician recommends and patient chooses bed at      Patient to be transferred to  [facility] on  07/04/15.  Patient to be transferred to facility by  [facility] (Updated Evette Cristal, MSW, LCSWA, 07-04-15)     Patient family notified on  07-04-15 of transfer.  (Updated Evette Cristal, MSW, [place], 07-04-15)  Name of family member notified:   Patient's family is at bedside and she will update other family members (Updated Evette Cristal, MSW, [place], 07-04-15)     PHYSICIAN Please sign FL2     Additional Comment:    _______________________________________________ Ross Ludwig, LCSWA 07/02/2015, 6:11 PM

## 2015-07-02 NOTE — Anesthesia Procedure Notes (Addendum)
Anesthesia Regional Block:  Adductor canal block  Pre-Anesthetic Checklist: ,, timeout performed, Correct Patient, Correct Site, Correct Laterality, Correct Procedure, Correct Position, site marked, Risks and benefits discussed,  Surgical consent,  Pre-op evaluation,  At surgeon's request and post-op pain management  Laterality: Right and Lower  Prep: Maximum Sterile Barrier Precautions used and chloraprep       Needles:  Injection technique: Single-shot  Needle Type: Echogenic Stimulator Needle     Needle Length: 10cm 10 cm Needle Gauge: 21 and 21 G    Additional Needles:  Procedures: ultrasound guided (picture in chart) Adductor canal block Narrative:  Injection made incrementally with aspirations every 5 mL.  Performed by: Personally   Additional Notes: Patient tolerated the procedure well without complications   Spinal Patient location during procedure: OR Staffing Anesthesiologist: Montez Hageman Performed by: anesthesiologist  Preanesthetic Checklist Completed: patient identified, site marked, surgical consent, pre-op evaluation, timeout performed, IV checked, risks and benefits discussed and monitors and equipment checked Spinal Block Patient position: sitting Prep: Betadine Patient monitoring: heart rate, continuous pulse ox and blood pressure Approach: right paramedian Location: L3-4 Injection technique: single-shot Needle Needle type: Sprotte  Needle gauge: 24 G Needle length: 9 cm Additional Notes Expiration date of kit checked and confirmed. Patient tolerated procedure well, without complications.

## 2015-07-02 NOTE — Transfer of Care (Signed)
Immediate Anesthesia Transfer of Care Note  Patient: Victoria Walker  Procedure(s) Performed: Procedure(s): TOTAL KNEE ARTHROPLASTY (Right)  Patient Location: PACU  Anesthesia Type:Regional and Spinal  Level of Consciousness: awake, alert , oriented and patient cooperative  Airway & Oxygen Therapy: Patient Spontanous Breathing and Patient connected to nasal cannula oxygen  Post-op Assessment: Report given to RN, Post -op Vital signs reviewed and stable, Patient moving all extremities X 4 and Patient able to stick tongue midline  Post vital signs: Reviewed and stable  Last Vitals:  Filed Vitals:   07/02/15 0614  BP: 134/77  Pulse: 64  Temp: 36.8 C  Resp: 18    Complications: No apparent anesthesia complications

## 2015-07-03 ENCOUNTER — Encounter (HOSPITAL_COMMUNITY): Payer: Self-pay | Admitting: Orthopedic Surgery

## 2015-07-03 LAB — GLUCOSE, CAPILLARY
Glucose-Capillary: 160 mg/dL — ABNORMAL HIGH (ref 65–99)
Glucose-Capillary: 176 mg/dL — ABNORMAL HIGH (ref 65–99)
Glucose-Capillary: 153 mg/dL — ABNORMAL HIGH (ref 65–99)
Glucose-Capillary: 151 mg/dL — ABNORMAL HIGH (ref 65–99)

## 2015-07-03 LAB — CBC
HCT: 34 % — ABNORMAL LOW (ref 36.0–46.0)
Hemoglobin: 11 g/dL — ABNORMAL LOW (ref 12.0–15.0)
MCH: 30.4 pg (ref 26.0–34.0)
MCHC: 32.4 g/dL (ref 30.0–36.0)
MCV: 93.9 fL (ref 78.0–100.0)
Platelets: 249 10*3/uL (ref 150–400)
RBC: 3.62 MIL/uL — ABNORMAL LOW (ref 3.87–5.11)
RDW: 12.6 % (ref 11.5–15.5)
WBC: 12.8 10*3/uL — ABNORMAL HIGH (ref 4.0–10.5)

## 2015-07-03 LAB — BASIC METABOLIC PANEL
Anion gap: 10 (ref 5–15)
BUN: 13 mg/dL (ref 6–20)
CO2: 23 mmol/L (ref 22–32)
Calcium: 8.8 mg/dL — ABNORMAL LOW (ref 8.9–10.3)
Chloride: 108 mmol/L (ref 101–111)
Creatinine, Ser: 0.81 mg/dL (ref 0.44–1.00)
GFR calc Af Amer: >60 mL/min (ref >60)
GFR calc non Af Amer: >60 mL/min (ref >60)
Glucose, Bld: 168 mg/dL — ABNORMAL HIGH (ref 65–99)
Potassium: 5 mmol/L (ref 3.5–5.1)
Sodium: 141 mmol/L (ref 135–145)

## 2015-07-03 MED ORDER — DILTIAZEM HCL ER COATED BEADS 180 MG PO CP24
360.0000 mg | ORAL_CAPSULE | Freq: Every day | ORAL | Status: DC
  Administered 2015-07-03 – 2015-07-04 (×2): 360 mg via ORAL
  Filled 2015-07-03 (×2): qty 2

## 2015-07-03 MED ORDER — SPIRONOLACTONE 25 MG PO TABS
37.5000 mg | ORAL_TABLET | Freq: Every day | ORAL | Status: DC
  Administered 2015-07-04: 37.5 mg via ORAL
  Filled 2015-07-03: qty 2

## 2015-07-03 NOTE — Progress Notes (Signed)
OT NOTE  Pt seen for initial eval. Pt will need SNF for rehab. Full note to follow.   [facility], OTR/L  [phone] 07/03/2015

## 2015-07-03 NOTE — Progress Notes (Signed)
Subjective: 1 Day Post-Op Procedure(s) (LRB): TOTAL KNEE ARTHROPLASTY (Right) Patient reports pain as 6 on 0-10 scale.    Objective: Vital signs in last 24 hours: Temp:  [97.5 F (36.4 C)-98.2 F (36.8 C)] 98.2 F (36.8 C) (03/14 0700) Pulse Rate:  [46-71] 63 (03/14 0700) Resp:  [11-20] 18 (03/14 0700) BP: (103-122)/(59-74) 111/71 mmHg (03/14 0700) SpO2:  [95 %-100 %] 96 % (03/14 0700)  Intake/Output from previous day: 03/13 0701 - 03/14 0700 In: 1500 [I.V.:1500] Out: 3360 [Urine:3350; Blood:10] Intake/Output this shift:     Recent Labs  07/03/15 0616  HGB 11.0*    Recent Labs  07/03/15 0616  WBC 12.8*  RBC 3.62*  HCT 34.0*  PLT 249    Recent Labs  07/03/15 0616  NA 141  K 5.0  CL 108  CO2 23  BUN 13  CREATININE 0.81  GLUCOSE 168*  CALCIUM 8.8*   No results for input(s): LABPT, INR in the last 72 hours.  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  Mariam Helbert J 07/03/2015, 8:24 AM

## 2015-07-03 NOTE — Progress Notes (Signed)
Physical Therapy Treatment Patient Details Name: Victoria Walker MRN: [id number] DOB: 10-Jun-1950 Today's Date: 07-14-2015    History of Present Illness 65 yo admitted for R TKA. PMHx: HTN, breast CA    PT Comments    Pt remains positive and eager to progress. Pt with increased strength, ROm, gait and functional mobility today. Pt again educated for tranfers and progression. Will continue to follow.   Follow Up Recommendations  SNF     Equipment Recommendations  Rolling walker with 5" wheels;3in1 (PT)    Recommendations for Other Services       Precautions / Restrictions Precautions Precautions: Fall;Knee Restrictions RLE Weight Bearing: Weight bearing as tolerated    Mobility  Bed Mobility Overal bed mobility: Needs Assistance Bed Mobility: Supine to Sit     Supine to sit: Supervision     General bed mobility comments: cues to use LLE to assist RLE to EOB with HOB flat  Transfers Overall transfer level: Needs assistance   Transfers: Sit to/from Stand Sit to Stand: Supervision         General transfer comment: cues for hand placement and safety  Ambulation/Gait Ambulation/Gait assistance: Min guard Ambulation Distance (Feet): 150 Feet Assistive device: Rolling walker (2 wheeled)     Gait velocity interpretation: Below normal speed for age/gender General Gait Details: cues for sequence, RW use and posture   Stairs            Wheelchair Mobility    Modified Rankin (Stroke Patients Only)       Balance                                    Cognition Arousal/Alertness: Awake/alert Behavior During Therapy: WFL for tasks assessed/performed Overall Cognitive Status: Within Functional Limits for tasks assessed                      Exercises Total Joint Exercises Heel Slides: AROM;Right;Supine;15 reps Hip ABduction/ADduction: AROM;Right;10 reps;Supine Straight Leg Raises: AROM;Right;10 reps;Supine Long Arc Quad:  AROM;Seated;Right;10 reps Goniometric ROM: 5-80    General Comments        Pertinent Vitals/Pain Pain Score: 3  Pain Location: right knee Pain Descriptors / Indicators: Sore Pain Intervention(s): Limited activity within patient's tolerance;Monitored during session;Premedicated before session;Repositioned;Ice applied    Home Living                      Prior Function            PT Goals (current goals can now be found in the care plan section) Progress towards PT goals: Progressing toward goals    Frequency       PT Plan Current plan remains appropriate    Co-evaluation             End of Session   Activity Tolerance: Patient tolerated treatment well Patient left: in chair;with call bell/phone within reach;with family/visitor present     Time: [id number] PT Time Calculation (min) (ACUTE ONLY): 35 min  Charges:  $Gait Training: 8-22 mins $Therapeutic Exercise: 8-22 mins                    G Codes:      Melford Aase July 14, 2015, 8:00 AM Elwyn Reach, [place]

## 2015-07-03 NOTE — Discharge Instructions (Signed)
Information on my medicine - XARELTO® (Rivaroxaban) ° °This medication education was reviewed with me or my healthcare representative as part of my discharge preparation.  The pharmacist that spoke with me during my hospital stay was:  Tarae Wooden Donovan, RPH ° °Why was Xarelto® prescribed for you? °Xarelto® was prescribed for you to reduce the risk of blood clots forming after orthopedic surgery. The medical term for these abnormal blood clots is venous thromboembolism (VTE). ° °What do you need to know about xarelto® ? °Take your Xarelto® ONCE DAILY at the same time every day. °You may take it either with or without food. ° °If you have difficulty swallowing the tablet whole, you may crush it and mix in applesauce just prior to taking your dose. ° °Take Xarelto® exactly as prescribed by your doctor and DO NOT stop taking Xarelto® without talking to the doctor who prescribed the medication.  Stopping without other VTE prevention medication to take the place of Xarelto® may increase your risk of developing a clot. ° °After discharge, you should have regular check-up appointments with your healthcare provider that is prescribing your Xarelto®.   ° °What do you do if you miss a dose? °If you miss a dose, take it as soon as you remember on the same day then continue your regularly scheduled once daily regimen the next day. Do not take two doses of Xarelto® on the same day.  ° °Important Safety Information °A possible side effect of Xarelto® is bleeding. You should call your healthcare provider right away if you experience any of the following: °? Bleeding from an injury or your nose that does not stop. °? Unusual colored urine (red or dark brown) or unusual colored stools (red or black). °? Unusual bruising for unknown reasons. °? A serious fall or if you hit your head (even if there is no bleeding). ° °Some medicines may interact with Xarelto® and might increase your risk of bleeding while on Xarelto®. To help  avoid this, consult your healthcare provider or pharmacist prior to using any new prescription or non-prescription medications, including herbals, vitamins, non-steroidal anti-inflammatory drugs (NSAIDs) and supplements. ° °This website has more information on Xarelto®: www.xarelto.com. ° ° ° ° °

## 2015-07-03 NOTE — Care Management Note (Signed)
Case Management Note  Patient Details  Name: Victoria Walker MRN: [id number] Date of Birth: [birth year]  Subjective/Objective:       S/p right total knee arthroplasty             Action/Plan: Set up with [facility] for HHPT by MD office. PT recommending SNF. Spoke with patient, she would prefer to go to SNF. Referral made to CSW. Will continue to follow.   Expected Discharge Date:                  Expected Discharge Plan:  Skilled Nursing Facility  In-House Referral:  Clinical Social Work  Discharge planning Services  CM Consult  Post Acute Care Choice:    Choice offered to:     DME Arranged:    DME Agency:     HH Arranged:    [place] Agency:     Status of Service:  In process, will continue to follow  Medicare Important Message Given:    Date Medicare IM Given:    Medicare IM give by:    Date Additional Medicare IM Given:    Additional Medicare Important Message give by:     If discussed at [place] of Stay Meetings, dates discussed:    Additional Comments:  Nila Nephew, RN 07/03/2015, 1:14 PM

## 2015-07-03 NOTE — Progress Notes (Signed)
OT Cancellation Note  Patient Details Name: Marylon Yonke MRN: [id number] DOB: 1950-12-03   Cancelled Treatment:    Reason Eval/Treat Not Completed: Other (comment) Attempted to see this am. Pt eating breakfast. Will return later.  [place], OTR/L  [id number] 07/03/2015 07/03/2015, 8:36 AM

## 2015-07-03 NOTE — Progress Notes (Signed)
Occupational Therapy Evaluation Patient Details Name: Victoria Walker MRN: [id number] DOB: Mar 17, 1951 Today's Date: 07/03/2015    History of Present Illness 65 yo admitted for R TKA. PMHx: HTN, breast CA   Clinical Impression   PTA, pt independent with ADL and mobility. Pt currently requires min A for mobility and Mod A for LB ADL. Will benefit from OT at SNF to facilitate safe D/C home. Pt doing well but moving very slowly. Will defer further OT to SNF.     Follow Up Recommendations  SNF;Supervision/Assistance - 24 hour    Equipment Recommendations  3 in 1 bedside comode    Recommendations for Other Services       Precautions / Restrictions Precautions Precautions: Fall;Knee Restrictions RLE Weight Bearing: Weight bearing as tolerated      Mobility Bed Mobility Overal bed mobility: Needs Assistance Bed Mobility: Supine to Sit     Supine to sit: Supervision     General bed mobility comments: Pt in chair  Transfers Overall transfer level: Needs assistance Equipment used: Rolling walker (2 wheeled) Transfers: Sit to/from [employer] Transfers Sit to Stand: Supervision Stand pivot transfers: Min guard       General transfer comment: cues for hand placement and safety    Balance Overall balance assessment: Needs assistance           Standing balance-Leahy Scale: Fair Standing balance comment: heavy reliance on RW but able to release for pericare                            ADL Overall ADL's : Needs assistance/impaired     Grooming: Set up;Standing   Upper Body Bathing: Set up;Supervision/ safety;Standing   Lower Body Bathing: Moderate assistance;Sit to/from stand   Upper Body Dressing : Supervision/safety;Set up;Sitting   Lower Body Dressing: Moderate assistance;Sit to/from stand   Toilet Transfer: Minimal assistance;RW;BSC   Toileting- Water quality scientist and Hygiene: Supervision/safety;Sit to/from stand        Functional mobility during ADLs: Minimal assistance;Cueing for safety;Cueing for sequencing;Rolling walker       Vision     Perception     Praxis      Pertinent Vitals/Pain Pain Assessment: 0-10 Pain Score: 8  Pain Location: R knee Pain Descriptors / Indicators: Aching;Sore Pain Intervention(s): Limited activity within patient's tolerance;Repositioned;Ice applied;Patient requesting pain meds-RN notified;RN gave pain meds during session     Hand Dominance     Extremity/Trunk Assessment Upper Extremity Assessment Upper Extremity Assessment: Overall WFL for tasks assessed   Lower Extremity Assessment Lower Extremity Assessment: Defer to PT evaluation   Cervical / Trunk Assessment Cervical / Trunk Assessment: Normal   Communication     Cognition Arousal/Alertness: Awake/alert Behavior During Therapy: WFL for tasks assessed/performed Overall Cognitive Status: Impaired/Different from baseline (due to medication)                     General Comments       Exercises       Shoulder Instructions      Home Living Family/patient expects to be discharged to:: Skilled nursing facility                                        Prior Functioning/Environment Level of Independence: Independent             OT Diagnosis: Generalized weakness;Acute pain  OT Problem List: Decreased strength;Decreased range of motion;Decreased activity tolerance;Impaired balance (sitting and/or standing);Decreased safety awareness;Decreased knowledge of use of DME or AE;Decreased knowledge of precautions;Pain   OT Treatment/Interventions:      OT Goals(Current goals can be found in the care plan section) Acute Rehab OT Goals Patient Stated Goal: be able to care for myself  OT Frequency:     Barriers to D/C:            Co-evaluation              End of Session Equipment Utilized During Treatment: Gait belt;Rolling walker CPM Right Knee CPM Right  Knee: Off Nurse Communication: Mobility status  Activity Tolerance: Patient tolerated treatment well Patient left: in chair;with call bell/phone within reach;with family/visitor present   Time: [id number] OT Time Calculation (min): 27 min Charges:  OT General Charges $OT Visit: 1 Procedure OT Evaluation $OT Eval Moderate Complexity: 1 Procedure OT Treatments $Self Care/Home Management : 8-22 mins G-Codes:    Eisha Chatterjee,[place] 08/01/15, 9:38 AM   Maurie Boettcher, OTR/L  [phone] 08-01-2015

## 2015-07-03 NOTE — Progress Notes (Signed)
Physical Therapy Treatment Patient Details Name: Victoria Walker MRN: [id number] DOB: 03-Dec-1950 Today's Date: 07-15-15    History of Present Illness 65 yo admitted for R TKA. PMHx: HTN, breast CA    PT Comments    Pt with continued progression with gait but decreased gait speed and tolerance this PM. Pt reports she has had too many drugs making her feel funny. Pt educated for HEP, transfers and gait. Will continue to follow.   Follow Up Recommendations  SNF     Equipment Recommendations       Recommendations for Other Services       Precautions / Restrictions Precautions Precautions: Fall;Knee Restrictions RLE Weight Bearing: Weight bearing as tolerated    Mobility  Bed Mobility               General bed mobility comments: Pt in chair  Transfers Overall transfer level: Needs assistance   Transfers: Sit to/from Stand Sit to Stand: Supervision         General transfer comment: cues for hand placement and safety  Ambulation/Gait Ambulation/Gait assistance: Min guard Ambulation Distance (Feet): 150 Feet Assistive device: Rolling walker (2 wheeled) Gait Pattern/deviations: Step-through pattern;Decreased stride length   Gait velocity interpretation: Below normal speed for age/gender General Gait Details: cues for sequence, RW use and posture decreased speed from AM session   Stairs            Wheelchair Mobility    Modified Rankin (Stroke Patients Only)       Balance                                    Cognition Arousal/Alertness: Awake/alert Behavior During Therapy: WFL for tasks assessed/performed Overall Cognitive Status: Within Functional Limits for tasks assessed                      Exercises Total Joint Exercises Hip ABduction/ADduction: AROM;Seated;Right;15 reps Straight Leg Raises: AAROM;Right;10 reps;Seated Long Arc Quad: AROM;Seated;Right;15 reps Knee Flexion: AROM;Seated;Right;10 reps    General  Comments        Pertinent Vitals/Pain Pain Score: 6  Pain Location: right knee Pain Descriptors / Indicators: Aching Pain Intervention(s): Limited activity within patient's tolerance;Monitored during session;Premedicated before session;Repositioned    Home Living                      Prior Function            PT Goals (current goals can now be found in the care plan section) Progress towards PT goals: Progressing toward goals    Frequency       PT Plan Current plan remains appropriate    Co-evaluation             End of Session   Activity Tolerance: Patient tolerated treatment well Patient left: in chair;with call bell/phone within reach     Time: 1143-1207 PT Time Calculation (min) (ACUTE ONLY): 24 min  Charges:  $Gait Training: 8-22 mins $Therapeutic Exercise: 8-22 mins                    G Codes:      Melford Aase 15-Jul-2015, 1:07 PM Elwyn Reach, [place]

## 2015-07-04 DIAGNOSIS — Z471 Aftercare following joint replacement surgery: Secondary | ICD-10-CM | POA: Diagnosis not present

## 2015-07-04 DIAGNOSIS — K5900 Constipation, unspecified: Secondary | ICD-10-CM | POA: Diagnosis not present

## 2015-07-04 DIAGNOSIS — E785 Hyperlipidemia, unspecified: Secondary | ICD-10-CM | POA: Diagnosis not present

## 2015-07-04 DIAGNOSIS — E1140 Type 2 diabetes mellitus with diabetic neuropathy, unspecified: Secondary | ICD-10-CM | POA: Diagnosis not present

## 2015-07-04 DIAGNOSIS — M1990 Unspecified osteoarthritis, unspecified site: Secondary | ICD-10-CM | POA: Diagnosis not present

## 2015-07-04 DIAGNOSIS — K219 Gastro-esophageal reflux disease without esophagitis: Secondary | ICD-10-CM | POA: Diagnosis not present

## 2015-07-04 DIAGNOSIS — E042 Nontoxic multinodular goiter: Secondary | ICD-10-CM | POA: Diagnosis not present

## 2015-07-04 DIAGNOSIS — I10 Essential (primary) hypertension: Secondary | ICD-10-CM | POA: Diagnosis not present

## 2015-07-04 DIAGNOSIS — M549 Dorsalgia, unspecified: Secondary | ICD-10-CM | POA: Diagnosis not present

## 2015-07-04 DIAGNOSIS — Z96651 Presence of right artificial knee joint: Secondary | ICD-10-CM | POA: Diagnosis not present

## 2015-07-04 DIAGNOSIS — R2689 Other abnormalities of gait and mobility: Secondary | ICD-10-CM | POA: Diagnosis not present

## 2015-07-04 DIAGNOSIS — M25561 Pain in right knee: Secondary | ICD-10-CM | POA: Diagnosis not present

## 2015-07-04 DIAGNOSIS — E119 Type 2 diabetes mellitus without complications: Secondary | ICD-10-CM | POA: Diagnosis not present

## 2015-07-04 DIAGNOSIS — C50919 Malignant neoplasm of unspecified site of unspecified female breast: Secondary | ICD-10-CM | POA: Diagnosis not present

## 2015-07-04 DIAGNOSIS — M25569 Pain in unspecified knee: Secondary | ICD-10-CM | POA: Diagnosis not present

## 2015-07-04 DIAGNOSIS — M8580 Other specified disorders of bone density and structure, unspecified site: Secondary | ICD-10-CM | POA: Diagnosis not present

## 2015-07-04 LAB — CBC
HCT: 34.6 % — ABNORMAL LOW (ref 36.0–46.0)
Hemoglobin: 11.6 g/dL — ABNORMAL LOW (ref 12.0–15.0)
MCH: 31.4 pg (ref 26.0–34.0)
MCHC: 33.5 g/dL (ref 30.0–36.0)
MCV: 93.8 fL (ref 78.0–100.0)
Platelets: 264 10*3/uL (ref 150–400)
RBC: 3.69 MIL/uL — ABNORMAL LOW (ref 3.87–5.11)
RDW: 13 % (ref 11.5–15.5)
WBC: 16.8 10*3/uL — ABNORMAL HIGH (ref 4.0–10.5)

## 2015-07-04 LAB — BASIC METABOLIC PANEL
Anion gap: 9 (ref 5–15)
BUN: 20 mg/dL (ref 6–20)
CO2: 25 mmol/L (ref 22–32)
Calcium: 9.1 mg/dL (ref 8.9–10.3)
Chloride: 105 mmol/L (ref 101–111)
Creatinine, Ser: 0.93 mg/dL (ref 0.44–1.00)
GFR calc Af Amer: >60 mL/min (ref >60)
GFR calc non Af Amer: >60 mL/min (ref >60)
Glucose, Bld: 168 mg/dL — ABNORMAL HIGH (ref 65–99)
Potassium: 4.9 mmol/L (ref 3.5–5.1)
Sodium: 139 mmol/L (ref 135–145)

## 2015-07-04 LAB — TB SKIN TEST
Induration: 0 mm
TB Skin Test: NEGATIVE

## 2015-07-04 LAB — GLUCOSE, CAPILLARY: Glucose-Capillary: 156 mg/dL — ABNORMAL HIGH (ref 65–99)

## 2015-07-04 MED ORDER — ACETAMINOPHEN 325 MG PO TABS: 650.0000 mg | ORAL_TABLET | Freq: Four times a day (QID) | ORAL | Status: DC | PRN

## 2015-07-04 MED ORDER — POLYETHYLENE GLYCOL 3350 17 G PO PACK: PACK | ORAL | Status: DC

## 2015-07-04 MED ORDER — DOCUSATE SODIUM 100 MG PO CAPS: ORAL_CAPSULE | ORAL | Status: DC

## 2015-07-04 MED ORDER — ATORVASTATIN CALCIUM 20 MG PO TABS: 20.0000 mg | ORAL_TABLET | Freq: Every day | ORAL | Status: DC

## 2015-07-04 MED ORDER — MOMETASONE FUROATE 50 MCG/ACT NA SUSP
1.0000 | Freq: Every day | NASAL | Status: DC | PRN
Start: 2015-07-04 — End: 2016-03-18

## 2015-07-04 MED ORDER — HYDROMORPHONE HCL 2 MG PO TABS: ORAL_TABLET | ORAL | Status: DC

## 2015-07-04 MED ORDER — GABAPENTIN 300 MG PO CAPS: 300.0000 mg | ORAL_CAPSULE | Freq: Every day | ORAL | Status: DC

## 2015-07-04 MED ORDER — LEVOTHYROXINE SODIUM 75 MCG PO TABS: 37.5000 ug | ORAL_TABLET | Freq: Every day | ORAL | Status: DC

## 2015-07-04 MED ORDER — METFORMIN HCL ER 750 MG PO TB24: ORAL_TABLET | ORAL | Status: DC

## 2015-07-04 MED ORDER — AVAPRO 150 MG PO TABS: 75.0000 mg | ORAL_TABLET | Freq: Every day | ORAL | Status: DC

## 2015-07-04 MED ORDER — RIVAROXABAN 10 MG PO TABS: 10.0000 mg | ORAL_TABLET | Freq: Every day | ORAL | Status: DC

## 2015-07-04 MED ORDER — SPIRONOLACTONE 25 MG PO TABS: ORAL_TABLET | ORAL | Status: DC

## 2015-07-04 NOTE — Progress Notes (Signed)
Physical Therapy Treatment Patient Details Name: Victoria Walker MRN: [id number] DOB: [birth year] Today's Date: 07/04/2015    History of Present Illness 65 yo admitted for R TKA. PMHx: HTN, breast CA    PT Comments    Pt progressing towards goals, performing transfers at mod I level, still needing supervision for safe ambulation. Ambulated 200' with RW. PT will continue to follow.   Follow Up Recommendations  SNF     Equipment Recommendations  Rolling walker with 5" wheels;3in1 (PT)    Recommendations for Other Services OT consult     Precautions / Restrictions Precautions Precautions: Fall;Knee Precaution Comments: reviewed proper positioning and rationale Knee Immobilizer - Right: Discontinue once straight leg raise with < 10 degree lag Restrictions Weight Bearing Restrictions: Yes RLE Weight Bearing: Weight bearing as tolerated    Mobility  Bed Mobility               General bed mobility comments: Pt in chair  Transfers Overall transfer level: Modified independent Equipment used: Rolling walker (2 wheeled) Transfers: Sit to/from Stand Sit to Stand: Modified independent (Device/Increase time)         General transfer comment: pt stood from chair and toilet with safety and without need for physical assist  Ambulation/Gait Ambulation/Gait assistance: Supervision Ambulation Distance (Feet): 200 Feet Assistive device: Rolling walker (2 wheeled) Gait Pattern/deviations: Step-through pattern;Decreased stride length Gait velocity: decreased Gait velocity interpretation: Below normal speed for age/gender General Gait Details: worked on increasing speed and letting right knee flex with swing through. Pt occasionally steps in a way that increases pain and relies on RW for support, encouraged her to continue with RW until she is able to ambulate without this happening and gait pattern is more even   Stairs            Wheelchair Mobility    Modified  Rankin (Stroke Patients Only)       Balance Overall balance assessment: Needs assistance Sitting-balance support: No upper extremity supported Sitting balance-Leahy Scale: Normal     Standing balance support: Single extremity supported Standing balance-Leahy Scale: Fair Standing balance comment: loss of balance when lets go of RW and tried to reach, self correction, no physical assist needed                    Cognition Arousal/Alertness: Awake/alert Behavior During Therapy: WFL for tasks assessed/performed Overall Cognitive Status: Within Functional Limits for tasks assessed                      Exercises Total Joint Exercises Ankle Circles/Pumps: AROM;Both;20 reps;Seated Quad Sets: AROM;Right;10 reps;Seated Gluteal Sets: AROM;Both;10 reps;Seated Heel Slides: AROM;Right;10 reps;Seated Hip ABduction/ADduction: AROM;Seated;Right;10 reps Straight Leg Raises: AAROM;Right;10 reps;Seated Long Arc Quad: AROM;Seated;Right;15 reps Knee Flexion: AROM;Seated;Right;5 reps Goniometric ROM: 5-80    General Comments        Pertinent Vitals/Pain Pain Assessment: Faces Faces Pain Scale: Hurts little more Pain Location: right knee Pain Descriptors / Indicators: Sore Pain Intervention(s): Limited activity within patient's tolerance;Monitored during session    Home Living                      Prior Function            PT Goals (current goals can now be found in the care plan section) Acute Rehab PT Goals Patient Stated Goal: be able to care for myself PT Goal Formulation: With patient Time For Goal Achievement: 07/09/15 Potential to Achieve Goals:  Good Progress towards PT goals: Progressing toward goals    Frequency  7X/week    PT Plan Current plan remains appropriate    Co-evaluation             End of Session Equipment Utilized During Treatment: Gait belt Activity Tolerance: Patient tolerated treatment well Patient left: in chair;with  call bell/phone within reach;with family/visitor present     Time: 0925-1006 PT Time Calculation (min) (ACUTE ONLY): 41 min  Charges:  $Gait Training: 23-37 mins $Therapeutic Exercise: 8-22 mins                    G Codes:     Leighton Roach, PT  Acute Rehab Services  [phone]  Leighton Roach 07/04/2015, 10:36 AM

## 2015-07-04 NOTE — Clinical Social Work Note (Signed)
Patient to be d/c'ed today to [facility] SN[facility].  Patient and family agreeable to plans will transport via ems RN to call report.  Evette Cristal, MSW, [place]

## 2015-07-04 NOTE — Discharge Summary (Signed)
Patient ID: Victoria Walker MRN: [id number] DOB/AGE: 1950/08/03 65 y.o.  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 Hypertension   . Neuropathy ([place])     FEET  . High cholesterol   . Hypothyroidism   . GERD (gastroesophageal reflux disease)   . Multinodular thyroid     PT HAVING TROUBLE SWALLOWING  . H/O hiatal hernia   . Claustrophobia   . H/O iritis     LEFT EYE - STATES HER EYE BECOMES RED AND VERY SENSITIVE TO LIGHT WHEN FLARE UP OF IRITIS  . History of chickenpox 09/26/2014  . History of measles   . History of shingles   . History of MRSA infection   . Medicare annual wellness visit, initial 04/07/2015  . Dysrhythmia     HX OF FAST HEART RATE AND PALPITATIONS - METOPROLOL HAS HELPED  . Wears glasses   . Spinal headache     spinal headache with epidural  . Heart murmur     "benign; 06/2015"  . Type II diabetes mellitus (H[facility])     ORAL MEDICATION - NO INSULIN  . History of blood transfusion     "when I had my partial hysterectomy"  . Arthritis     "both knees" (07/02/2015)  . Primary localized osteoarthritis of right knee   . Cancer of right breast [facility])     Surgeries: Procedure(s): TOTAL KNEE ARTHROPLASTY on 07/02/2015   Consultants:    Discharged Condition: Improved  Hospital Course: Victoria Walker is an 65 y.o. female who was admitted 07/02/2015 for operative treatment  ofPrimary localized osteoarthritis of right knee. Patient has severe unremitting pain that affects sleep, daily activities, and work/hobbies. After pre-op clearance the patient was taken to the operating room on 07/02/2015 and underwent  Procedure(s): TOTAL KNEE ARTHROPLASTY.    Patient was given perioperative antibiotics: Anti-infectives    Start     Dose/Rate Route Frequency Ordered Stop   07/02/15 2000  vancomycin (VANCOCIN) IVPB 1000 mg/200 mL premix     1,000 mg 200 mL/hr over 60 Minutes Intravenous Every 12 hours 07/02/15 0944 07/02/15 2133   07/02/15 0700  vancomycin (VANCOCIN) IVPB 1000 mg/200 mL premix     1,000 mg 200 mL/hr over 60 Minutes Intravenous To [facility] 06/29/15 1159 07/02/15 0716       Patient was given sequential compression devices, early ambulation, and chemoprophylaxis to prevent DVT. Monitored on telemetry due to abnormal ekg preop  Patient benefited maximally from hospital stay and there were no complications.    Recent vital signs: Patient Vitals for the past 24 hrs:  BP Temp Temp src Pulse Resp SpO2  07/04/15 0449 135/64 mmHg 98.9 F (37.2 C) Oral 86 16 99 %  07/03/15 2130 (!) 129/59 mmHg 99.2 F (37.3 C) - 68 15 98 %  07/03/15 1347 127/64 mmHg 98.5 F (36.9 C) - 82 16 97 %  Recent laboratory studies:  Recent Labs  07/03/15 0616 07/04/15 0605  WBC 12.8* 16.8*  HGB 11.0* 11.6*  HCT 34.0* 34.6*  PLT 249 264  NA 141  --   K 5.0  --   CL 108  --   CO2 23  --   BUN 13  --   CREATININE 0.81  --   GLUCOSE 168*  --   CALCIUM 8.8*  --      Discharge Medications:     Medication List    STOP taking these medications        diclofenac sodium 1 % Gel  Commonly known as:  VOLTAREN     fish oil-omega-3 fatty acids 1000 MG capsule     vitamin E 1000 UNIT capsule      TAKE these medications        acetaminophen 325 MG tablet  Commonly known as:  TYLENOL  Take 2 tablets (650 mg total) by mouth every 6 (six) hours as needed for  mild pain (or Fever >/= 101).     atorvastatin 20 MG tablet  Commonly known as:  LIPITOR  Take 1-2 tablets (20-40 mg total) by mouth daily at 6 PM. Take 1 tablet daily and on Saturday and Sunday take 2 tablets.     AVAPRO 150 MG tablet  Generic drug:  irbesartan  Take 0.5-1 tablets (75-150 mg total) by mouth at bedtime.     calcium-vitamin D 250-125 MG-UNIT tablet  Commonly known as:  OSCAL WITH D  Take 1 tablet by mouth daily.     Ciclopirox 1 % shampoo  Apply 1 each topically every Friday.     cycloSPORINE 0.05 % ophthalmic emulsion  Commonly known as:  RESTASIS  Place 1 drop into both eyes 2 (two) times daily.     diltiazem 360 MG 24 hr capsule  Commonly known as:  TIAZAC  Take 1 capsule (360 mg total) by mouth every morning.     docusate sodium 100 MG capsule  Commonly known as:  COLACE  1 tab 2 times a day while on narcotics.  STOOL SOFTENER     esomeprazole 40 MG capsule  Commonly known as:  NEXIUM  Take 1 capsule (40 mg total) by mouth every morning.     gabapentin 300 MG capsule  Commonly known as:  NEURONTIN  Take 1 capsule (300 mg total) by mouth at bedtime.     glucose blood test strip  Commonly known as:  ONE TOUCH TEST STRIPS  Use as directed twice daily to check blood sugar. DX code E11.9     HYDROmorphone 2 MG tablet  Commonly known as:  DILAUDID  1-2 tablets every 4-6 hrs as needed for pain     levocetirizine 5 MG tablet  Commonly known as:  XYZAL  Take 1 tablet (5 mg total) by mouth every evening.     levothyroxine 75 MCG tablet  Commonly known as:  SYNTHROID, LEVOTHROID  Take 0.5-1 tablets (37.5-75 mcg total) by mouth daily before breakfast. Take one tablet Monday through Friday and one half tablet Saturday and Sunday     loteprednol 0.5 % ophthalmic suspension  Commonly known as:  LOTEMAX  Place 1 drop into the left eye daily as needed ("arthiritis of the eye").     metFORMIN 750 MG 24 hr tablet  Commonly known as:  GLUCOPHAGE-XR  TAKE 1  TABLET IN AM AND 1 TABLET AT supper     metoprolol succinate 25 MG 24 hr  tablet  Commonly known as:  TOPROL-XL  Take 25 mg by mouth 2 (two) times daily.     mometasone 50 MCG/ACT nasal spray  Commonly known as:  NASONEX  Place 1-2 sprays into the nose daily as needed (for congestion).     polyethylene glycol packet  Commonly known as:  MIRALAX / GLYCOLAX  17grams in 16 oz of water twice a day until bowel movement.  LAXITIVE.  Restart if two days since last bowel movement     PROBIOTIC DAILY PO  Take 1 capsule by mouth daily.     REFRESH 1.4-0.6 % ophthalmic solution  Generic drug:  polyvinyl alcohol-povidone  Place 1 drop into both eyes 2 (two) times daily.     rivaroxaban 10 MG Tabs tablet  Commonly known as:  XARELTO  Take 1 tablet (10 mg total) by mouth daily with breakfast.     sodium chloride 0.65 % Soln nasal spray  Commonly known as:  OCEAN  Place 1 spray into both nostrils daily as needed for congestion.     spironolactone 25 MG tablet  Commonly known as:  ALDACTONE  TAKE 1.5 TABLET (37.5) BY MOUTH daily     Vitamin D 2000 units Caps  Take 1 capsule by mouth every evening.        Diagnostic Studies: No results found.  Disposition: Skilled nursing facility      Discharge Instructions    CPM    Complete by:  As directed   Continuous passive motion machine (CPM):      Use the CPM from 0 to 90 for 6 hours per day.       You may break it up into 2 or 3 sessions per day.      Use CPM for 2 weeks or until you are told to stop.     Call MD / Call 911    Complete by:  As directed   If you experience chest pain or shortness of breath, CALL 911 and be transported to the hospital emergency room.  If you develope a fever above 101 F, pus (white drainage) or increased drainage or redness at the wound, or calf pain, call your surgeon's office.     Change dressing    Complete by:  As directed   Change the gauze dressing daily with sterile 4 x 4 inch gauze and apply TED  hose.  DO NOT REMOVE BANDAGE OVER SURGICAL INCISION.  [place] WHOLE LEG INCLUDING OVER THE WATERPROOF BANDAGE WITH SOAP AND WATER EVERY DAY.     Constipation Prevention    Complete by:  As directed   Drink plenty of fluids.  Prune juice may be helpful.  You may use a stool softener, such as Colace (over the counter) 100 mg twice a day.  Use MiraLax (over the counter) for constipation as needed.     Diet - low sodium heart healthy    Complete by:  As directed      Discharge instructions    Complete by:  As directed   INSTRUCTIONS AFTER JOINT REPLACEMENT   Remove items at home which could result in a fall. This includes throw rugs or furniture in walking pathways ICE to the affected joint every three hours while awake for 30 minutes at a time, for at least the first 3-5 days, and then as needed for pain and swelling.  Continue to use ice for pain and swelling. You may notice swelling that will progress down to the foot and  ankle.  This is normal after surgery.  Elevate your leg when you are not up walking on it.   Continue to use the breathing machine you got in the hospital (incentive spirometer) which will help keep your temperature down.  It is common for your temperature to cycle up and down following surgery, especially at night when you are not up moving around and exerting yourself.  The breathing machine keeps your lungs expanded and your temperature down.   DIET:  As you were doing prior to hospitalization, we recommend a well-balanced diet.  DRESSING / WOUND CARE / SHOWERING  Keep the surgical dressing until follow up.  The dressing is water proof, so you can shower without any extra covering.  IF THE DRESSING FALLS OFF or the wound gets wet inside, change the dressing with sterile gauze.  Please use good hand washing techniques before changing the dressing.  Do not use any lotions or creams on the incision until instructed by your surgeon.    ACTIVITY  Increase activity slowly as  tolerated, but follow the weight bearing instructions below.   No driving for 6 weeks or until further direction given by your physician.  You cannot drive while taking narcotics.  No lifting or carrying greater than 10 lbs. until further directed by your surgeon. Avoid periods of inactivity such as sitting longer than an hour when not asleep. This helps prevent blood clots.  You may return to work once you are authorized by your doctor.     WEIGHT BEARING   Weight bearing as tolerated with assist device (walker, cane, etc) as directed, use it as long as suggested by your surgeon or therapist, typically at least 2-3 weeks.   EXERCISES  Results after joint replacement surgery are often greatly improved when you follow the exercise, range of motion and muscle strengthening exercises prescribed by your doctor. Safety measures are also important to protect the joint from further injury. Any time any of these exercises cause you to have increased pain or swelling, decrease what you are doing until you are comfortable again and then slowly increase them. If you have problems or questions, call your caregiver or physical therapist for advice.   Rehabilitation is important following a joint replacement. After just a few days of immobilization, the muscles of the leg can become weakened and shrink (atrophy).  These exercises are designed to build up the tone and strength of the thigh and leg muscles and to improve motion. Often times heat used for twenty to thirty minutes before working out will loosen up your tissues and help with improving the range of motion but do not use heat for the first two weeks following surgery (sometimes heat can increase post-operative swelling).   These exercises can be done on a training (exercise) mat, on the floor, on a table or on a bed. Use whatever works the best and is most comfortable for you.    Use music or television while you are exercising so that the exercises  are a pleasant break in your day. This will make your life better with the exercises acting as a break in your routine that you can look forward to.   Perform all exercises about fifteen times, three times per day or as directed.  You should exercise both the operative leg and the other leg as well.   Exercises include:  Quad Sets - Tighten up the muscle on the front of the thigh (Quad) and hold for 5-10 seconds.  Straight Leg Raises - With your knee straight (if you were given a brace, keep it on), lift the leg to 60 degrees, hold for 3 seconds, and slowly lower the leg.  Perform this exercise against resistance later as your leg gets stronger.  Leg Slides: Lying on your back, slowly slide your foot toward your buttocks, bending your knee up off the floor (only go as far as is comfortable). Then slowly slide your foot back down until your leg is flat on the floor again.  Angel Wings: Lying on your back spread your legs to the side as far apart as you can without causing discomfort.  Hamstring Strength:  Lying on your back, push your heel against the floor with your leg straight by tightening up the muscles of your buttocks.  Repeat, but this time bend your knee to a comfortable angle, and push your heel against the floor.  You may put a pillow under the heel to make it more comfortable if necessary.   A rehabilitation program following joint replacement surgery can speed recovery and prevent re-injury in the future due to weakened muscles. Contact your doctor or a physical therapist for more information on knee rehabilitation.    CONSTIPATION  Constipation is defined medically as fewer than three stools per week and severe constipation as less than one stool per week.  Even if you have a regular bowel pattern at home, your normal regimen is likely to be disrupted due to multiple reasons following surgery.  Combination of anesthesia, postoperative narcotics, change in appetite and fluid intake all  can affect your bowels.   YOU MUST use at least one of the following options; they are listed in order of increasing strength to get the job done.  They are all available over the counter, and you may need to use some, POSSIBLY even all of these options:    Drink plenty of fluids (prune juice may be helpful) and high fiber foods Colace 100 mg by mouth twice a day  Senokot for constipation as directed and as needed Dulcolax (bisacodyl), take with full glass of water  Miralax (polyethylene glycol) once or twice a day as needed.  If you have tried all these things and are unable to have a bowel movement in the first 3-4 days after surgery call either your surgeon or your primary doctor.    If you experience loose stools or diarrhea, hold the medications until you stool forms back up.  If your symptoms do not get better within 1 week or if they get worse, check with your doctor.  If you experience "the worst abdominal pain ever" or develop nausea or vomiting, please contact the office immediately for further recommendations for treatment.   ITCHING:  If you experience itching with your medications, try taking only a single pain pill, or even half a pain pill at a time.  You can also use Benadryl over the counter for itching or also to help with sleep.   TED HOSE STOCKINGS:  Use stockings on both legs until for at least 2 weeks or as directed by physician office. They may be removed at night for sleeping.  MEDICATIONS:  See your medication summary on the "After Visit Summary" that nursing will review with you.  You may have some home medications which will be placed on hold until you complete the course of blood thinner medication.  It is important for you to complete the blood thinner medication as prescribed.  PRECAUTIONS:  If  you experience chest pain or shortness of breath - call 911 immediately for transfer to the hospital emergency department.   If you develop a fever greater that 101 F,  purulent drainage from wound, increased redness or drainage from wound, foul odor from the wound/dressing, or calf pain - CONTACT YOUR SURGEON.                                                   FOLLOW-UP APPOINTMENTS:  If you do not already have a post-op appointment, please call the office for an appointment to be seen by your surgeon.  Guidelines for how soon to be seen are listed in your "After Visit Summary", but are typically between 1-4 weeks after surgery.  OTHER INSTRUCTIONS:   Knee Replacement:  Do not place pillow under knee, focus on keeping the knee straight while resting. CPM instructions: 0-90 degrees, 2 hours in the morning, 2 hours in the afternoon, and 2 hours in the evening. Place foam block, curve side up under heel at all times except when in CPM or when walking.  DO NOT modify, tear, cut, or change the foam block in any way.  MAKE SURE YOU:  Understand these instructions.  Get help right away if you are not doing well or get worse.    Thank you for letting us be a part of your medical care team.  It is a privilege we respect greatly.  We hope these instructions will help you stay on track for a fast and full recovery!     Do not put a pillow under the knee. Place it under the heel.    Complete by:  As directed   Place gray foam block, curve side up under heel at all times except when in CPM or when walking.  DO NOT modify, tear, cut, or change in any way the gray foam block.     Increase activity slowly as tolerated    Complete by:  As directed      Patient may shower    Complete by:  As directed   Aquacel dressing is water proof    Wash over it and the whole leg with soap and water at the end of your shower     TED hose    Complete by:  As directed   Use stockings (TED hose) for 2 weeks on both leg(s).  You may remove them at night for sleeping.           Follow-up Information    Follow up with Lorn Junes, MD On 07/16/2015.   Specialty:  Orthopedic Surgery    Why:  appt time 2:45 pm   Contact information:   [address] [place] [address] [phone]        Signed: Linda Hedges 07/04/2015, 7:29 AM

## 2015-07-06 DIAGNOSIS — K5900 Constipation, unspecified: Secondary | ICD-10-CM | POA: Diagnosis not present

## 2015-07-06 DIAGNOSIS — E119 Type 2 diabetes mellitus without complications: Secondary | ICD-10-CM | POA: Diagnosis not present

## 2015-07-06 DIAGNOSIS — E785 Hyperlipidemia, unspecified: Secondary | ICD-10-CM | POA: Diagnosis not present

## 2015-07-06 DIAGNOSIS — I10 Essential (primary) hypertension: Secondary | ICD-10-CM | POA: Diagnosis not present

## 2015-07-06 DIAGNOSIS — M25561 Pain in right knee: Secondary | ICD-10-CM | POA: Diagnosis not present

## 2015-07-06 DIAGNOSIS — K219 Gastro-esophageal reflux disease without esophagitis: Secondary | ICD-10-CM | POA: Diagnosis not present

## 2015-07-09 DIAGNOSIS — M25561 Pain in right knee: Secondary | ICD-10-CM | POA: Diagnosis not present

## 2015-07-09 DIAGNOSIS — R2689 Other abnormalities of gait and mobility: Secondary | ICD-10-CM | POA: Diagnosis not present

## 2015-07-11 DIAGNOSIS — R2689 Other abnormalities of gait and mobility: Secondary | ICD-10-CM | POA: Diagnosis not present

## 2015-07-11 DIAGNOSIS — M25561 Pain in right knee: Secondary | ICD-10-CM | POA: Diagnosis not present

## 2015-07-16 DIAGNOSIS — Z96651 Presence of right artificial knee joint: Secondary | ICD-10-CM | POA: Diagnosis not present

## 2015-07-17 DIAGNOSIS — I10 Essential (primary) hypertension: Secondary | ICD-10-CM | POA: Diagnosis not present

## 2015-07-17 DIAGNOSIS — Z471 Aftercare following joint replacement surgery: Secondary | ICD-10-CM | POA: Diagnosis not present

## 2015-07-19 DIAGNOSIS — Z96651 Presence of right artificial knee joint: Secondary | ICD-10-CM | POA: Diagnosis not present

## 2015-07-19 DIAGNOSIS — M549 Dorsalgia, unspecified: Secondary | ICD-10-CM | POA: Diagnosis not present

## 2015-07-19 DIAGNOSIS — C50919 Malignant neoplasm of unspecified site of unspecified female breast: Secondary | ICD-10-CM | POA: Diagnosis not present

## 2015-07-19 DIAGNOSIS — M8560 Other cyst of bone, unspecified site: Secondary | ICD-10-CM | POA: Diagnosis not present

## 2015-07-19 DIAGNOSIS — Z471 Aftercare following joint replacement surgery: Secondary | ICD-10-CM | POA: Diagnosis not present

## 2015-07-23 ENCOUNTER — Encounter: Payer: Self-pay | Admitting: Physical Therapy

## 2015-07-23 ENCOUNTER — Ambulatory Visit: Payer: Medicare HMO | Attending: Orthopedic Surgery | Admitting: Physical Therapy

## 2015-07-23 DIAGNOSIS — M25661 Stiffness of right knee, not elsewhere classified: Secondary | ICD-10-CM

## 2015-07-23 DIAGNOSIS — M25561 Pain in right knee: Secondary | ICD-10-CM | POA: Insufficient documentation

## 2015-07-23 DIAGNOSIS — R262 Difficulty in walking, not elsewhere classified: Secondary | ICD-10-CM

## 2015-07-23 DIAGNOSIS — R2241 Localized swelling, mass and lump, right lower limb: Secondary | ICD-10-CM | POA: Diagnosis not present

## 2015-07-23 NOTE — Therapy (Signed)
[address] [place], [place], [address] Phone: [phone]   Fax:  [phone]  Physical Therapy Evaluation  Patient Details  Name: Victoria Walker MRN: [id number] Date of Birth: October 06, 1950 Referring Provider: Noemi Chapel  Encounter Date: 07/23/2015      PT End of Session - 07/23/15 1349    Visit Number 1   Date for PT Re-Evaluation 09/22/15   PT Start Time 1319   PT Stop Time 1425   PT Time Calculation (min) 66 min   Activity Tolerance Patient tolerated treatment well   Behavior During Therapy [facility] for tasks assessed/performed      Past Medical History  Diagnosis Date  . Hypertension   . Neuropathy ([place])     FEET  . High cholesterol   . Hypothyroidism   . GERD (gastroesophageal reflux disease)   . Multinodular thyroid     PT HAVING TROUBLE SWALLOWING  . H/O hiatal hernia   . Claustrophobia   . H/O iritis     LEFT EYE - STATES HER EYE BECOMES RED AND VERY SENSITIVE TO LIGHT WHEN FLARE UP OF IRITIS  . History of chickenpox 09/26/2014  . History of measles   . History of shingles   . History of MRSA infection   . Medicare annual wellness visit, initial 04/07/2015  . Dysrhythmia     HX OF FAST HEART RATE AND PALPITATIONS - METOPROLOL HAS HELPED  . Wears glasses   . Spinal headache     spinal headache with epidural  . Heart murmur     "benign; 06/2015"  . Type II diabetes mellitus (HCC)     ORAL MEDICATION - NO INSULIN  . History of blood transfusion     "when I had my partial hysterectomy"  . Arthritis     "both knees" (07/02/2015)  . Primary localized osteoarthritis of right knee   . Cancer of right breast [facility])     Past Surgical History  Procedure Laterality Date  . Total thyroidectomy  1976    removed three tumor (2 on right; 1 on left)  . Plantar fascia surgery Left   . Abdominoplasty  1986  . Hernia repair    . Anterior and posterior repair  05/11/2012    Procedure: ANTERIOR  (CYSTOCELE) AND POSTERIOR REPAIR (RECTOCELE);  Surgeon: Reece Packer, MD;  Location: [facility];  Service: Urology;;  with graft  . Vaginal prolapse repair  05/11/2012    Procedure: VAGINAL VAULT SUSPENSION;  Surgeon: Reece Packer, MD;  Location: [facility];  Service: Urology;;  . Cystoscopy with urethral dilatation  05/11/2012    Procedure: CYSTOSCOPY WITH URETHRAL DILATATION;  Surgeon: Reece Packer, MD;  [facility];  Service: Urology;;  . Thyroidectomy N/A 02/23/2014    Procedure: TOTAL THYROIDECTOMY;  Surgeon: Armandina Gemma, MD;  Location: [facility];  Service: General;  Laterality: N/A;  . Vulva / perineum biopsy      vulvar  . Mastectomy Right 2007  . Colonoscopy    . Esophagogastroduodenoscopy    . Total knee arthroplasty Right 07/02/2015  . Umbilical hernia repair  1986  . Breast biopsy Right   . Abdominal hysterectomy  1988  . Tubal ligation    . Cardiac catheterization  1999  . Total knee arthroplasty Right 07/02/2015    Procedure: TOTAL KNEE ARTHROPLASTY;  Surgeon: Elsie Saas, MD;  Location: [place];  Service: Orthopedics;  Laterality: Right;    There were no vitals filed  for this visit.  Visit Diagnosis:  Pain in right knee - Plan: PT plan of care cert/re-cert  Stiffness of right knee, not elsewhere classified - Plan: PT plan of care cert/re-cert  Difficulty in walking, not elsewhere classified - Plan: PT plan of care cert/re-cert  Localized swelling, mass and lump, right lower limb - Plan: PT plan of care cert/re-cert      Subjective Assessment - 07/23/15 1327    Subjective Patient underwent a right knee replacement on July 02, 2015.  She had 2 weeks in [place] reports that she does not bend her knee very well.   Limitations Standing;Walking;House hold activities   Patient Stated Goals walk wihtout device, have no pain, walk for exercise   Currently in Pain? Yes   Pain Score 4    Pain Location Knee   Pain Orientation Right   Pain Descriptors /  Indicators Aching;Tightness   Pain Type Surgical pain   Pain Onset 1 to 4 weeks ago   Pain Frequency Constant   Aggravating Factors  stretching up to 8/10   Pain Relieving Factors pain meds, off of the leg pain 2/10   Effect of Pain on Daily Activities limits walking and all ADL's            [facility] PT Assessment - 07/23/15 0001    Assessment   Medical Diagnosis s/p right TKR   Referring Provider [place]   Onset Date/Surgical Date 07/02/15   Prior Therapy yes at the rehab center   Precautions   Precautions None   Balance Screen   Has the patient fallen in the past 6 months No   Has the patient had a decrease in activity level because of a fear of falling?  No   Is the patient reluctant to leave their home because of a fear of falling?  No   Home Environment   Additional Comments housework, likes to walk for exercise   Prior Function   Level of Independence Independent   Vocation Retired   Leisure was walking 1-2 miles up to about 2 years ago   Observation/Other Assessments-Edema    Edema --  ballotable patella, swelling of the right knee   ROM / Strength   AROM / PROM / Strength AROM;PROM;Strength   AROM   AROM Assessment Site Knee   Right/Left Knee Right   Right Knee Extension 17   Right Knee Flexion 87   PROM   PROM Assessment Site Knee   Right/Left Knee Right   Right Knee Extension 4   Right Knee Flexion 92   Strength   Overall Strength Comments 3+/5 for flexion, 4-/5 for extension   Palpation   Palpation comment significant edema of the right knee, ballotable    Ambulation/Gait   Gait Comments gait is with FWW, good pace, decreased toe off, slight hip hike at toe off due to decreased knee flexion, hold knee in slight flexion, but does not bend or straighten                   [facility] Adult PT Treatment/Exercise - 07/23/15 0001    Exercises   Exercises Knee/Hip   Knee/Hip Exercises: Aerobic   Nustep level 3 x 5 minutes   Knee/Hip Exercises: Machines  for Strengthening   Cybex Knee Extension 5# 2x10   Cybex Knee Flexion 25# 2x10   Modalities   Modalities Vasopneumatic   Vasopneumatic   Number Minutes Vasopneumatic  15 minutes   Vasopnuematic Location  Knee  Vasopneumatic Pressure Medium   Vasopneumatic Temperature  37                PT Education - 08-12-15 1348    Education provided Yes   Education Details low load long duration stretches for flexion and extension   Person(s) Educated Patient   Methods Explanation;Demonstration;Handout   Comprehension Verbalized understanding;Returned demonstration          PT Short Term Goals - 12-Aug-2015 1352    PT SHORT TERM GOAL #1   Title independent with initial HEP   Time 2   Period Weeks   Status New           PT Long Term Goals - 08-12-15 1353    PT LONG TERM GOAL #1   Title decrease pain 50%   Time 12   Period Weeks   Status New   PT LONG TERM GOAL #2   Title increase AROM of the right knee to 4-115 degrees flexion   Time 12   Period Weeks   Status New   PT LONG TERM GOAL #3   Title walk witout assistive device   Time 12   Period Weeks   Status New   PT LONG TERM GOAL #4   Title goe up and down steps step over step   Time 12   Period Weeks   Status New   PT LONG TERM GOAL #5   Title walk 600 feet without rest   Time 12   Period Weeks   Status New               Plan - 08/12/15 1350    Clinical Impression Statement Patient with right TKR 07/02/15.  She has very stiff knee, the lack of extension seems to be from the large amount of swelling behind the patella.  Good speed with walking but decreased toe off due to lack of knee flexion   Pt will benefit from skilled therapeutic intervention in order to improve on the following deficits Abnormal gait;Decreased mobility;Decreased range of motion;Decreased scar mobility;Decreased strength;Increased edema;Difficulty walking;Increased muscle spasms;Impaired flexibility;Pain   Rehab Potential Good    PT Frequency 3x / week   PT Duration 8 weeks   PT Treatment/Interventions ADLs/Self Care Home Management;Cryotherapy;Electrical Stimulation;Therapeutic exercise;Therapeutic activities;Functional mobility training;Stair training;Gait training;Balance training;Neuromuscular re-education;Patient/family education;Manual techniques;Passive range of motion   PT Next Visit Plan slowly add exercises and manual techniques to gain ROM   Consulted and Agree with Plan of Care Patient          G-Codes - 08-12-15 1356    Functional Assessment Tool Used foto   Functional Limitation Mobility: Walking and moving around   Mobility: Walking and Moving Around Current Status [phone]) At least 60 percent but less than 80 percent impaired, limited or restricted   Mobility: Walking and Moving Around Goal Status [phone]) At least 40 percent but less than 60 percent impaired, limited or restricted       Problem List Patient Active Problem List   Diagnosis Date Noted  . Primary localized osteoarthritis of right knee   . History of MRSA infection   . History of measles   . Dysrhythmia   . Chronic rhinitis 06/06/2015  . Shortness of breath 06/06/2015  . Vaginitis and vulvovaginitis 04/25/2015  . Lumbar back pain 04/25/2015  . Medicare annual wellness visit, initial 04/07/2015  . Allergic response 01/01/2015  . History of chickenpox 09/26/2014  . History of shingles   . Diabetes mellitus without complication (  [place]) 05/25/2014  . 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 Deformity of metatarsal bone of left foot 03/03/2014  . Vulvar irritation  S/P neg vulvar bx 12/2013 01/23/2014  . 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 Right knee DJD 07/12/2013  . Neuralgia of left foot 06/24/2013  . Tarsal tunnel syndrome 04/08/2013  . Vaginal dryness, menopausal 04/05/2013  . Pronation deformity of ankle, acquired 02/09/2013  . Cortical cataract 02/01/2013  . H/O bone  density study 01/26/2013  . Chest pain 01/05/2013  . History of iritis 01/04/2013  . Dry eye syndrome 10/07/2012  . Meibomian gland disease 10/07/2012  . GERD (gastroesophageal reflux disease) 10/05/2012  . Essential hypertension, benign 10/04/2012  . Hyperlipidemia, mixed 10/04/2012  . 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 Diabetic neuropathy ([place]) 10/04/2012  . S/P hysterectomy 10/04/2012  . Osteopenia 10/04/2012  . Hypothyroidism 10/04/2012  . History of cystocele 10/04/2012  . Dyspareunia in female 10/04/2012  . Plantar fasciitis 10/04/2012  . Nuclear sclerotic cataract 06/17/2012  . Diabetes mellitus ([place]) 03/02/2012  . BP (high blood pressure) 01/06/2012  . Iritis 01/06/2012  . Neuropathy ([place]) 01/06/2012  . Disease of thyroid gland 01/06/2012  . Breast cancer ([place]) 05/06/2011    Sumner Boast., PT 07/23/2015, 2:24 PM  [address] [place], [place], [address] Phone: [phone]   Fax:  [phone]  Name: Bular [place] MRN: [id number] Date of Birth: 1951-03-18

## 2015-07-25 ENCOUNTER — Ambulatory Visit: Payer: Medicare HMO | Admitting: Rehabilitative and Restorative Service Providers"

## 2015-07-25 ENCOUNTER — Ambulatory Visit (INDEPENDENT_AMBULATORY_CARE_PROVIDER_SITE_OTHER): Payer: Medicare HMO | Admitting: Medical

## 2015-07-25 ENCOUNTER — Encounter: Payer: Self-pay | Admitting: Medical

## 2015-07-25 ENCOUNTER — Telehealth: Payer: Self-pay | Admitting: Medical

## 2015-07-25 VITALS — BP 118/80 | HR 76 | Temp 98.4°F | Ht 65.0 in | Wt 174.4 lb

## 2015-07-25 DIAGNOSIS — M25569 Pain in unspecified knee: Secondary | ICD-10-CM | POA: Diagnosis not present

## 2015-07-25 DIAGNOSIS — R791 Abnormal coagulation profile: Secondary | ICD-10-CM

## 2015-07-25 DIAGNOSIS — M25561 Pain in right knee: Secondary | ICD-10-CM | POA: Diagnosis not present

## 2015-07-25 DIAGNOSIS — R7989 Other specified abnormal findings of blood chemistry: Secondary | ICD-10-CM

## 2015-07-25 LAB — D-DIMER, QUANTITATIVE: D-Dimer, Quant: 6.15 ug/mL-FEU — ABNORMAL HIGH (ref 0.00–0.48)

## 2015-07-25 NOTE — Patient Instructions (Signed)
For your rt knee pain and leg swelling will get cbc and d-dimer. Some concern that rehab thought you had infection and gave antibiotic. And swelling presently causes me to want to get d-dimer. If d-dimer test is elevated then will need to do  Ultrasound again.   Continue meds for pain.   If you get fever, chills, or redness of knee notify me.  Follow up as regularly scheduled with pcp and with ortho. Otherwise follow as needed with myself.

## 2015-07-25 NOTE — Telephone Encounter (Signed)
When is pt doppler scheduled tomorrow? Please let me know.

## 2015-07-25 NOTE — Progress Notes (Signed)
Subjective:    Patient ID: Victoria Walker, female    DOB: 1950/12/07, 65 y.o.   MRN: [id number]  HPI  Pt has been going to rehab and [facility] for 2 weeks. She now will got to PT at [facility]. PT will start on Monday.   Pt going to therapy post rt knee replacement.  Pt is on dilaudid. She takes 1 tab every 4 hours. She was taking 2 tab every 6 hours.   Pt has some reduced range of motion rt knee. They state they are working on strengthening her quadriceps.  At [facility] burn they gave her antibiotics. They thought she had infection. They did doppler of her leg and was negative for DVT.Pt denies any shortness of breath. No fever, or sweats. But some chills at night.    Review of Systems  Constitutional: Negative for fever, chills and fatigue.  Respiratory: Negative for cough, chest tightness, shortness of breath and wheezing.   Cardiovascular: Negative for chest pain and palpitations.  Musculoskeletal:       Rt knee pain.  Neurological: Negative for dizziness and numbness.  Hematological: Negative for adenopathy. Does not bruise/bleed easily.  Psychiatric/Behavioral: Negative for behavioral problems and confusion.    Past Medical History  Diagnosis Date  . Hypertension   . Neuropathy ([place])     FEET  . High cholesterol   . Hypothyroidism   . GERD (gastroesophageal reflux disease)   . Multinodular thyroid     PT HAVING TROUBLE SWALLOWING  . H/O hiatal hernia   . Claustrophobia   . H/O iritis     LEFT EYE - STATES HER EYE BECOMES RED AND VERY SENSITIVE TO LIGHT WHEN FLARE UP OF IRITIS  . History of chickenpox 09/26/2014  . History of measles   . History of shingles   . History of MRSA infection   . Medicare annual wellness visit, initial 04/07/2015  . Dysrhythmia     HX OF FAST HEART RATE AND PALPITATIONS - METOPROLOL HAS HELPED  . Wears glasses   . Spinal headache     spinal headache with epidural  . Heart murmur     "benign; 06/2015"  . Type II diabetes mellitus  (HCC)     ORAL MEDICATION - NO INSULIN  . History of blood transfusion     "when I had my partial hysterectomy"  . Arthritis     "both knees" (07/02/2015)  . Primary localized osteoarthritis of right knee   . Cancer of right breast [facility])     Social History   Social History  . Marital Status: Married    Spouse Name: N/A  . Number of Children: 3  . Years of Education: N/A   Occupational History  .      Retired   Social History Main Topics  . Smoking status: Never Smoker   . Smokeless tobacco: Never Used  . Alcohol Use: No  . Drug Use: No  . Sexual Activity: Not on file     Comment: lives with husband, retired from various jobs, diabetic diet   Other Topics Concern  . Not on file   Social History Narrative    Past Surgical History  Procedure Laterality Date  . Total thyroidectomy  1976    removed three tumor (2 on right; 1 on left)  . Plantar fascia surgery Left   . Abdominoplasty  1986  . Hernia repair    . Anterior and posterior repair  05/11/2012    Procedure: ANTERIOR (CYSTOCELE)  AND POSTERIOR REPAIR (RECTOCELE);  Surgeon: Reece Packer, MD;  [facility];  Service: Urology;;  with graft  . Vaginal prolapse repair  05/11/2012    Procedure: VAGINAL VAULT SUSPENSION;  Surgeon: Reece Packer, MD;  [facility];  Service: Urology;;  . Cystoscopy with urethral dilatation  05/11/2012    Procedure: CYSTOSCOPY WITH URETHRAL DILATATION;  Surgeon: Reece Packer, MD;  [facility];  Service: Urology;;  . Thyroidectomy N/A 02/23/2014    Procedure: TOTAL THYROIDECTOMY;  Surgeon: Armandina Gemma, MD;  [facility];  Service: General;  Laterality: N/A;  . Vulva / perineum biopsy      vulvar  . Mastectomy Right 2007  . Colonoscopy    . Esophagogastroduodenoscopy    . Total knee arthroplasty Right 07/02/2015  . Umbilical hernia repair  1986  . Breast biopsy Right   . Abdominal hysterectomy  1988  . Tubal ligation    . Cardiac catheterization  1999  .  Total knee arthroplasty Right 07/02/2015    Procedure: TOTAL KNEE ARTHROPLASTY;  Surgeon: Elsie Saas, MD;  Location: [place];  Service: Orthopedics;  Laterality: Right;    Family History  Problem Relation Age of Onset  . Hypertension Mother   . Heart disease Mother     pacer, CHF  . Hyperlipidemia Mother   . Kidney disease Mother     1 kidney due to stone  . Kidney Stones Mother   . Hypertension Father   . Cancer Father     lung, psa  . Diabetes Father   . Alcohol abuse Father   . Diabetes Sister   . Cancer Sister   . Hypertension Sister   . Diabetes Brother   . Hypertension Brother   . Hyperlipidemia Brother   . Kidney disease Brother     dialysis  . Stroke Brother   . Hypertension Maternal Aunt   . Hypertension Maternal Uncle   . Hypertension Paternal Aunt   . Cancer Paternal Aunt   . Hypertension Paternal Uncle   . Hypertension Maternal Grandmother   . Heart disease Maternal Grandmother   . Stroke Maternal Grandmother   . Hypertension Maternal Grandfather   . Hypertension Daughter   . Arthritis Son   . Hyperlipidemia Sister   . Hypertension Sister   . Diabetes Sister   . Thyroid disease Sister   . Cancer Sister     thyroid and breast  . Arthritis Sister     back pain  . Hypertension Sister   . Thyroid disease Sister   . Hypertension Brother   . Hyperlipidemia Brother   . Hypertension Brother   . Hyperlipidemia Brother   . Benign prostatic hyperplasia Brother   . Hypertension Daughter   . Hyperlipidemia Daughter   . Diabetes Daughter     Allergies  Allergen Reactions  . Aspirin     Throat burning, even with enteric-coated aspirin.  . Codeine Anxiety    Jittery   . Penicillins Itching  . Sulfa Antibiotics Itching  . Vicodin [Hydrocodone-Acetaminophen] Itching    ITCHING     Current Outpatient Prescriptions on File Prior to Visit  Medication Sig Dispense Refill  . acetaminophen (TYLENOL) 325 MG tablet Take 2 tablets (650 mg total) by mouth every  6 (six) hours as needed for mild pain (or Fever >/= 101).    Marland Kitchen atorvastatin (LIPITOR) 20 MG tablet Take 1-2 tablets (20-40 mg total) by mouth daily at 6 PM. Take 1 tablet daily and on  Saturday and Sunday take 2 tablets. 105 tablet 2  . AVAPRO 150 MG tablet Take 0.5-1 tablets (75-150 mg total) by mouth at bedtime. 30 tablet 5  . calcium-vitamin D (OSCAL WITH D) 250-125 MG-UNIT per tablet Take 1 tablet by mouth daily.     . Cholecalciferol (VITAMIN D) 2000 UNITS CAPS Take 1 capsule by mouth every evening.     . Ciclopirox 1 % shampoo Apply 1 each topically every Friday.     . cycloSPORINE (RESTASIS) 0.05 % ophthalmic emulsion Place 1 drop into both eyes 2 (two) times daily.    Marland Kitchen diltiazem (TIAZAC) 360 MG 24 hr capsule Take 1 capsule (360 mg total) by mouth every morning. 90 capsule 3  . docusate sodium (COLACE) 100 MG capsule 1 tab 2 times a day while on narcotics.  STOOL SOFTENER 60 capsule 0  . esomeprazole (NEXIUM) 40 MG capsule Take 1 capsule (40 mg total) by mouth every morning. 90 capsule 1  . gabapentin (NEURONTIN) 300 MG capsule Take 1 capsule (300 mg total) by mouth at bedtime. 270 capsule 1  . glucose blood (ONE TOUCH TEST STRIPS) test strip Use as directed twice daily to check blood sugar. DX code E11.9 300 each 1  . HYDROmorphone (DILAUDID) 2 MG tablet 1-2 tablets every 4-6 hrs as needed for pain 100 tablet 0  . levocetirizine (XYZAL) 5 MG tablet Take 1 tablet (5 mg total) by mouth every evening. 90 tablet 0  . levothyroxine (SYNTHROID, LEVOTHROID) 75 MCG tablet Take 0.5-1 tablets (37.5-75 mcg total) by mouth daily before breakfast. Take one tablet Monday through Friday and one half tablet Saturday and Sunday 90 tablet 1  . loteprednol (LOTEMAX) 0.5 % ophthalmic suspension Place 1 drop into the left eye daily as needed ("arthiritis of the eye").     . metFORMIN (GLUCOPHAGE-XR) 750 MG 24 hr tablet TAKE 1 TABLET IN AM AND 1 TABLET AT supper 180 tablet 1  . metoprolol succinate (TOPROL-XL) 25  MG 24 hr tablet Take 25 mg by mouth 2 (two) times daily.     . mometasone (NASONEX) 50 MCG/ACT nasal spray Place 1-2 sprays into the nose daily as needed (for congestion). 17 g 5  . polyethylene glycol (MIRALAX / GLYCOLAX) packet 17grams in 16 oz of water twice a day until bowel movement.  LAXITIVE.  Restart if two days since last bowel movement 14 each 0  . polyvinyl alcohol-povidone (REFRESH) 1.4-0.6 % ophthalmic solution Place 1 drop into both eyes 2 (two) times daily.    . Probiotic Product (PROBIOTIC DAILY PO) Take 1 capsule by mouth daily.    . rivaroxaban (XARELTO) 10 MG TABS tablet Take 1 tablet (10 mg total) by mouth daily with breakfast. 30 tablet 0  . sodium chloride (OCEAN) 0.65 % SOLN nasal spray Place 1 spray into both nostrils daily as needed for congestion.    Marland Kitchen spironolactone (ALDACTONE) 25 MG tablet TAKE 1.5 TABLET (37.5) BY MOUTH daily 180 tablet 1   No current facility-administered medications on file prior to visit.    BP 118/80 mmHg  Pulse 76  Temp(Src) 98.4 F (36.9 C) (Oral)  Ht 5\' 5"  (1.651 m)  Wt 174 lb 6.4 oz (79.107 kg)  BMI 29.02 kg/m2  SpO2 98%       Objective:   Physical Exam  General- No acute distress. Pleasant patient. Neck- Full range of motion, no jvd Lungs- Clear, even and unlabored. Heart- regular rate and rhythm. Neurologic- CNII- XII grossly intact.  Rt knee-  mild swelling and mild decreased rom. Negative homans signs. Faint warm on palpation. No redness. Incision line is clean. Mild swelling upper tibia.       Assessment & Plan:  For your rt knee pain and leg swelling will get cbc and d-dimer. Some concern that rehab thought you had infection and gave antibiotic. And swelling presently causes me to want to get d-dimer. If d-dimer test is elevated then will need to do  Ultrasound again.   Continue meds for pain.   If you get fever, chills, or redness of knee notify me.  Follow up as regularly scheduled with pcp and with ortho.  Otherwise follow as needed with myself.

## 2015-07-25 NOTE — Progress Notes (Signed)
Pre visit review using our clinic review tool, if applicable. No additional management support is needed unless otherwise documented below in the visit note. 

## 2015-07-26 ENCOUNTER — Encounter: Payer: Self-pay | Admitting: Physical Therapy

## 2015-07-26 ENCOUNTER — Ambulatory Visit (HOSPITAL_BASED_OUTPATIENT_CLINIC_OR_DEPARTMENT_OTHER)
Admission: RE | Admit: 2015-07-26 | Discharge: 2015-07-26 | Disposition: A | Discharge status: Home / Self Care | Payer: Medicare HMO | Source: Ambulatory Visit | Attending: Medical | Admitting: Medical

## 2015-07-26 ENCOUNTER — Other Ambulatory Visit (HOSPITAL_BASED_OUTPATIENT_CLINIC_OR_DEPARTMENT_OTHER): Payer: Medicare HMO

## 2015-07-26 ENCOUNTER — Ambulatory Visit: Payer: Medicare HMO | Admitting: Physical Therapy

## 2015-07-26 DIAGNOSIS — R262 Difficulty in walking, not elsewhere classified: Secondary | ICD-10-CM

## 2015-07-26 DIAGNOSIS — M25561 Pain in right knee: Secondary | ICD-10-CM | POA: Diagnosis not present

## 2015-07-26 DIAGNOSIS — R7989 Other specified abnormal findings of blood chemistry: Secondary | ICD-10-CM

## 2015-07-26 DIAGNOSIS — R2241 Localized swelling, mass and lump, right lower limb: Secondary | ICD-10-CM

## 2015-07-26 DIAGNOSIS — R791 Abnormal coagulation profile: Secondary | ICD-10-CM | POA: Insufficient documentation

## 2015-07-26 DIAGNOSIS — M7989 Other specified soft tissue disorders: Secondary | ICD-10-CM | POA: Diagnosis not present

## 2015-07-26 DIAGNOSIS — M25661 Stiffness of right knee, not elsewhere classified: Secondary | ICD-10-CM

## 2015-07-26 LAB — CBC WITH DIFFERENTIAL/PLATELET
Basophils Absolute: 0 10*3/uL (ref 0.0–0.1)
Basophils Relative: 0.4 % (ref 0.0–3.0)
Eosinophils Absolute: 0.1 10*3/uL (ref 0.0–0.7)
Eosinophils Relative: 1.9 % (ref 0.0–5.0)
HCT: 36.7 % (ref 36.0–46.0)
Hemoglobin: 11.9 g/dL — ABNORMAL LOW (ref 12.0–15.0)
Lymphocytes Relative: 32.5 % (ref 12.0–46.0)
Lymphs Abs: 2.5 10*3/uL (ref 0.7–4.0)
MCHC: 32.4 g/dL (ref 30.0–36.0)
MCV: 96.8 fl (ref 78.0–100.0)
Monocytes Absolute: 0.4 10*3/uL (ref 0.1–1.0)
Monocytes Relative: 5.4 % (ref 3.0–12.0)
Neutro Abs: 4.6 10*3/uL (ref 1.4–7.7)
Neutrophils Relative %: 59.8 % (ref 43.0–77.0)
Platelets: 493 10*3/uL — ABNORMAL HIGH (ref 150.0–400.0)
RBC: 3.79 Mil/uL — ABNORMAL LOW (ref 3.87–5.11)
RDW: 15.6 % — ABNORMAL HIGH (ref 11.5–15.5)
WBC: 7.7 10*3/uL (ref 4.0–10.5)

## 2015-07-26 NOTE — Telephone Encounter (Signed)
Patient is coming in at 1130 this am. The 830 was taken when I called down to imaging on 07/25/15.

## 2015-07-26 NOTE — Therapy (Signed)
[place], [place], [address] Phone: [phone]   Fax:  [phone]  Physical Therapy Treatment  Patient Details  Name: Victoria Walker MRN: [id number] Date of Birth: [birth year] Referring Provider: Noemi Chapel  Encounter Date: 07/26/2015      PT End of Session - 07/26/15 1425    Visit Number 2   Date for PT Re-Evaluation 09/22/15   PT Start Time 1345   PT Stop Time 1445   PT Time Calculation (min) 60 min   Activity Tolerance Patient tolerated treatment well   Behavior During Therapy [facility] for tasks assessed/performed      Past Medical History  Diagnosis Date  . Hypertension   . Neuropathy ([place])     FEET  . High cholesterol   . Hypothyroidism   . GERD (gastroesophageal reflux disease)   . Multinodular thyroid     PT HAVING TROUBLE SWALLOWING  . H/O hiatal hernia   . Claustrophobia   . H/O iritis     LEFT EYE - STATES HER EYE BECOMES RED AND VERY SENSITIVE TO LIGHT WHEN FLARE UP OF IRITIS  . History of chickenpox 09/26/2014  . History of measles   . History of shingles   . History of MRSA infection   . Medicare annual wellness visit, initial 04/07/2015  . Dysrhythmia     HX OF FAST HEART RATE AND PALPITATIONS - METOPROLOL HAS HELPED  . Wears glasses   . Spinal headache     spinal headache with epidural  . Heart murmur     "benign; 06/2015"  . Type II diabetes mellitus (HCC)     ORAL MEDICATION - NO INSULIN  . History of blood transfusion     "when I had my partial hysterectomy"  . Arthritis     "both knees" (07/02/2015)  . Primary localized osteoarthritis of right knee   . Cancer of right breast [facility])     Past Surgical History  Procedure Laterality Date  . Total thyroidectomy  1976    removed three tumor (2 on right; 1 on left)  . Plantar fascia surgery Left   . Abdominoplasty  1986  . Hernia repair    . Anterior and posterior repair  05/11/2012    Procedure: ANTERIOR  (CYSTOCELE) AND POSTERIOR REPAIR (RECTOCELE);  Surgeon: Reece Packer, MD;  [facility]: [facility];  Service: Urology;;  with graft  . Vaginal prolapse repair  05/11/2012    Procedure: VAGINAL VAULT SUSPENSION;  Surgeon: Reece Packer, MD;  [facility];  Service: Urology;;  . Cystoscopy with urethral dilatation  05/11/2012    Procedure: CYSTOSCOPY WITH URETHRAL DILATATION;  Surgeon: Reece Packer, MD;  [facility];  Service: Urology;;  . Thyroidectomy N/A 02/23/2014    Procedure: TOTAL THYROIDECTOMY;  Surgeon: Armandina Gemma, MD;  [facility];  Service: General;  Laterality: N/A;  . Vulva / perineum biopsy      vulvar  . Mastectomy Right 2007  . Colonoscopy    . Esophagogastroduodenoscopy    . Total knee arthroplasty Right 07/02/2015  . Umbilical hernia repair  1986  . Breast biopsy Right   . Abdominal hysterectomy  1988  . Tubal ligation    . Cardiac catheterization  1999  . Total knee arthroplasty Right 07/02/2015    Procedure: TOTAL KNEE ARTHROPLASTY;  Surgeon: Elsie Saas, MD;  Location: [place];  Service: Orthopedics;  Laterality: Right;    There were no vitals filed  for this visit.  Visit Diagnosis:  Localized swelling, mass and lump, right lower limb  Difficulty in walking, not elsewhere classified  Pain in right knee  Stiffness of right knee, not elsewhere classified      Subjective Assessment - 07/26/15 1345    Subjective "Its been pretty good, just tight"   Currently in Pain? No/denies   Pain Score 0-No pain            [facility] PT Assessment - 07/26/15 0001    AROM   AROM Assessment Site Knee   Right/Left Knee Right   Right Knee Extension 10   Right Knee Flexion 92                     [facility] Adult PT Treatment/Exercise - 07/26/15 0001    Exercises   Exercises Knee/Hip   Knee/Hip Exercises: Aerobic   Nustep level 3 x 6 minutes   Knee/Hip Exercises: Machines for Strengthening   Cybex Leg Press #20 2x10   Knee/Hip Exercises:  Seated   Long Arc Quad 2 sets;10 reps   Ball Squeeze 2x10   Marching 2 sets;10 reps                  PT Short Term Goals - 07/26/15 1435    PT SHORT TERM GOAL #1   Title independent with initial HEP   Status Achieved           PT Long Term Goals - 07/23/15 1353    PT LONG TERM GOAL #1   Title decrease pain 50%   Time 12   Period Weeks   Status New   PT LONG TERM GOAL #2   Title increase AROM of the right knee to 4-115 degrees flexion   Time 12   Period Weeks   Status New   PT LONG TERM GOAL #3   Title walk witout assistive device   Time 12   Period Weeks   Status New   PT LONG TERM GOAL #4   Title goe up and down steps step over step   Time 12   Period Weeks   Status New   PT LONG TERM GOAL #5   Title walk 600 feet without rest   Time 12   Period Weeks   Status New               Plan - 07/26/15 1429    Clinical Impression Statement Pt with some improvement in R knee AROM. Pt has progressed to leg press intervention. Continues with swelling behind the patella.   Pt will benefit from skilled therapeutic intervention in order to improve on the following deficits Abnormal gait;Decreased mobility;Decreased range of motion;Decreased scar mobility;Decreased strength;Increased edema;Difficulty walking;Increased muscle spasms;Impaired flexibility;Pain   Rehab Potential Good   PT Frequency 3x / week   PT Duration 8 weeks   PT Treatment/Interventions ADLs/Self Care Home Management;Cryotherapy;Electrical Stimulation;Therapeutic exercise;Therapeutic activities;Functional mobility training;Stair training;Gait training;Balance training;Neuromuscular re-education;Patient/family education;Manual techniques;Passive range of motion   PT Next Visit Plan slowly add exercises and manual techniques to gain ROM        Problem List Patient Active Problem List   Diagnosis Date Noted  . Primary localized osteoarthritis of right knee   . History of MRSA infection    . History of measles   . Dysrhythmia   . Chronic rhinitis 06/06/2015  . Shortness of breath 06/06/2015  . Vaginitis and vulvovaginitis 04/25/2015  . Lumbar back pain 04/25/2015  .  Medicare annual wellness visit, initial 04/07/2015  . Allergic response 01/01/2015  . History of chickenpox 09/26/2014  . History of shingles   . Diabetes mellitus without complication ([place]) 123XX123  . 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 Deformity of metatarsal bone of left foot 03/03/2014  . Vulvar irritation  S/P neg vulvar bx 12/2013 01/23/2014  . 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 Right knee DJD 07/12/2013  . Neuralgia of left foot 06/24/2013  . Tarsal tunnel syndrome 04/08/2013  . Vaginal dryness, menopausal 04/05/2013  . Pronation deformity of ankle, acquired 02/09/2013  . Cortical cataract 02/01/2013  . H/O bone density study 01/26/2013  . Chest pain 01/05/2013  . History of iritis 01/04/2013  . Dry eye syndrome 10/07/2012  . Meibomian gland disease 10/07/2012  . GERD (gastroesophageal reflux disease) 10/05/2012  . Essential hypertension, benign 10/04/2012  . Hyperlipidemia, mixed 10/04/2012  . 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 Diabetic neuropathy ([place]) 10/04/2012  . S/P hysterectomy 10/04/2012  . Osteopenia 10/04/2012  . Hypothyroidism 10/04/2012  . History of cystocele 10/04/2012  . Dyspareunia in female 10/04/2012  . Plantar fasciitis 10/04/2012  . Nuclear sclerotic cataract 06/17/2012  . Diabetes mellitus ([place]) 03/02/2012  . BP (high blood pressure) 01/06/2012  . Iritis 01/06/2012  . Neuropathy ([place]) 01/06/2012  . Disease of thyroid gland 01/06/2012  . Breast cancer ([place]) 05/06/2011    Scot Jun, PTA  07/26/2015, 2:36 PM  [place], [place], [address] Phone: [phone]   Fax:  [phone]  Name: Tishawn Diggs MRN:  [id number] Date of Birth: 1950/08/17

## 2015-07-30 ENCOUNTER — Encounter: Payer: Self-pay | Admitting: Physical Therapy

## 2015-07-30 ENCOUNTER — Ambulatory Visit: Payer: Medicare HMO | Admitting: Physical Therapy

## 2015-07-30 DIAGNOSIS — R262 Difficulty in walking, not elsewhere classified: Secondary | ICD-10-CM

## 2015-07-30 DIAGNOSIS — M25561 Pain in right knee: Secondary | ICD-10-CM | POA: Diagnosis not present

## 2015-07-30 DIAGNOSIS — R2241 Localized swelling, mass and lump, right lower limb: Secondary | ICD-10-CM

## 2015-07-30 DIAGNOSIS — M25661 Stiffness of right knee, not elsewhere classified: Secondary | ICD-10-CM

## 2015-07-30 NOTE — Therapy (Signed)
[place], [place], [address] Phone: [phone]   Fax:  [phone]  Physical Therapy Treatment  Patient Details  Name: Victoria Walker MRN: [id number] Date of Birth: [birth year] Referring Provider: Noemi Chapel  Encounter Date: 07/30/2015      PT End of Session - 07/30/15 1126    Visit Number 3   Date for PT Re-Evaluation 09/22/15   PT Start Time [id number]   PT Stop Time 1118   PT Time Calculation (min) 63 min   Activity Tolerance Patient tolerated treatment well   Behavior During Therapy [facility] for tasks assessed/performed      Past Medical History  Diagnosis Date  . Hypertension   . Neuropathy ([place])     FEET  . High cholesterol   . Hypothyroidism   . GERD (gastroesophageal reflux disease)   . Multinodular thyroid     PT HAVING TROUBLE SWALLOWING  . H/O hiatal hernia   . Claustrophobia   . H/O iritis     LEFT EYE - STATES HER EYE BECOMES RED AND VERY SENSITIVE TO LIGHT WHEN FLARE UP OF IRITIS  . History of chickenpox 09/26/2014  . History of measles   . History of shingles   . History of MRSA infection   . Medicare annual wellness visit, initial 04/07/2015  . Dysrhythmia     HX OF FAST HEART RATE AND PALPITATIONS - METOPROLOL HAS HELPED  . Wears glasses   . Spinal headache     spinal headache with epidural  . Heart murmur     "benign; 06/2015"  . Type II diabetes mellitus (HCC)     ORAL MEDICATION - NO INSULIN  . History of blood transfusion     "when I had my partial hysterectomy"  . Arthritis     "both knees" (07/02/2015)  . Primary localized osteoarthritis of right knee   . Cancer of right breast [facility])     Past Surgical History  Procedure Laterality Date  . Total thyroidectomy  1976    removed three tumor (2 on right; 1 on left)  . Plantar fascia surgery Left   . Abdominoplasty  1986  . Hernia repair    . Anterior and posterior repair  05/11/2012    Procedure: ANTERIOR  (CYSTOCELE) AND POSTERIOR REPAIR (RECTOCELE);  Surgeon: Reece Packer, MD;  [facility];  Service: Urology;;  with graft  . Vaginal prolapse repair  05/11/2012    Procedure: VAGINAL VAULT SUSPENSION;  Surgeon: Reece Packer, MD;  [facility];  Service: Urology;;  . Cystoscopy with urethral dilatation  05/11/2012    Procedure: CYSTOSCOPY WITH URETHRAL DILATATION;  Surgeon: Reece Packer, MD;  [facility];  Service: Urology;;  . Thyroidectomy N/A 02/23/2014    Procedure: TOTAL THYROIDECTOMY;  Surgeon: Armandina Gemma, MD;  [facility];  Service: General;  Laterality: N/A;  . Vulva / perineum biopsy      vulvar  . Mastectomy Right 2007  . Colonoscopy    . Esophagogastroduodenoscopy    . Total knee arthroplasty Right 07/02/2015  . Umbilical hernia repair  1986  . Breast biopsy Right   . Abdominal hysterectomy  1988  . Tubal ligation    . Cardiac catheterization  1999  . Total knee arthroplasty Right 07/02/2015    Procedure: TOTAL KNEE ARTHROPLASTY;  Surgeon: Elsie Saas, MD;  Location: [place];  Service: Orthopedics;  Laterality: Right;    There were no vitals filed  for this visit.      Subjective Assessment - 07/30/15 1015    Subjective "I've been feeling good, just having trouble with pain and night and not being able to sleep."   Pain Score 2    Pain Location Knee   Pain Orientation Right   Pain Descriptors / Indicators Tightness   Pain Relieving Factors Rest, pain meds                         [facility] Adult PT Treatment/Exercise - 07/30/15 0001    Ambulation/Gait   Gait Comments gait with walker, cane, HHA, verbal and tactile cues to focus on knee flexion during swing phase   Knee/Hip Exercises: Stretches   Passive Hamstring Stretch Right;2 reps;30 seconds   Quad Stretch Right;3 reps;30 seconds   Knee/Hip Exercises: Aerobic   Nustep level 4 x 6 minutes   Knee/Hip Exercises: Machines for Strengthening   Cybex Leg Press 20# 3x10    Knee/Hip Exercises: Standing   Knee Flexion Right;3 sets;10 reps   Other Standing Knee Exercises standing marches 3x10   Knee/Hip Exercises: Seated   Long Arc Quad 2 sets;10 reps   Modalities   Modalities Vasopneumatic   Vasopneumatic   Number Minutes Vasopneumatic  15 minutes   Vasopnuematic Location  Knee   Vasopneumatic Pressure Medium   Manual Therapy   Manual Therapy Passive ROM   Manual therapy comments Rt knee   Passive ROM Flexion and extension                  PT Short Term Goals - 07/26/15 1435    PT SHORT TERM GOAL #1   Title independent with initial HEP   Status Achieved           PT Long Term Goals - 07/23/15 1353    PT LONG TERM GOAL #1   Title decrease pain 50%   Time 12   Period Weeks   Status New   PT LONG TERM GOAL #2   Title increase AROM of the right knee to 4-115 degrees flexion   Time 12   Period Weeks   Status New   PT LONG TERM GOAL #3   Title walk witout assistive device   Time 12   Period Weeks   Status New   PT LONG TERM GOAL #4   Title goe up and down steps step over step   Time 12   Period Weeks   Status New   PT LONG TERM GOAL #5   Title walk 600 feet without rest   Time 12   Period Weeks   Status New               Plan - 07/30/15 1126    Clinical Impression Statement Pt with soreness today from weekend activities. Pt has progress with knee extension, but is still limited with knee flexion. Pt continues to have swelling in Rt knee. Focused on knee flexion during Right leg swing phase during gait.   PT Treatment/Interventions ADLs/Self Care Home Management;Cryotherapy;Electrical Stimulation;Therapeutic exercise;Therapeutic activities;Functional mobility training;Stair training;Gait training;Balance training;Neuromuscular re-education;Patient/family education;Manual techniques;Passive range of motion   PT Next Visit Plan Progress ther ex and gait with cane, focusing on knee flexion during swing phase. Manual ROM  to increase flexion.      Patient will benefit from skilled therapeutic intervention in order to improve the following deficits and impairments:     Visit Diagnosis: Pain in right knee  Stiffness of right knee, not elsewhere classified  Difficulty in walking, not elsewhere classified  Localized swelling, mass and lump, right lower limb     Problem List Patient Active Problem List   Diagnosis Date Noted  . Primary localized osteoarthritis of right knee   . History of MRSA infection   . History of measles   . Dysrhythmia   . Chronic rhinitis 06/06/2015  . Shortness of breath 06/06/2015  . Vaginitis and vulvovaginitis 04/25/2015  . Lumbar back pain 04/25/2015  . Medicare annual wellness visit, initial 04/07/2015  . Allergic response 01/01/2015  . History of chickenpox 09/26/2014  . History of shingles   . Diabetes mellitus without complication ([place]) 123XX123  . 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 Deformity of metatarsal bone of left foot 03/03/2014  . Vulvar irritation  S/P neg vulvar bx 12/2013 01/23/2014  . 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 Right knee DJD 07/12/2013  . Neuralgia of left foot 06/24/2013  . Tarsal tunnel syndrome 04/08/2013  . Vaginal dryness, menopausal 04/05/2013  . Pronation deformity of ankle, acquired 02/09/2013  . Cortical cataract 02/01/2013  . H/O bone density study 01/26/2013  . Chest pain 01/05/2013  . History of iritis 01/04/2013  . Dry eye syndrome 10/07/2012  . Meibomian gland disease 10/07/2012  . GERD (gastroesophageal reflux disease) 10/05/2012  . Essential hypertension, benign 10/04/2012  . Hyperlipidemia, mixed 10/04/2012  . 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 Diabetic neuropathy ([place]) 10/04/2012  . S/P hysterectomy 10/04/2012  . Osteopenia 10/04/2012  . Hypothyroidism 10/04/2012  . History of cystocele 10/04/2012  . Dyspareunia in female 10/04/2012  . Plantar  fasciitis 10/04/2012  . Nuclear sclerotic cataract 06/17/2012  . Diabetes mellitus ([place]) 03/02/2012  . BP (high blood pressure) 01/06/2012  . Iritis 01/06/2012  . Neuropathy ([place]) 01/06/2012  . Disease of thyroid gland 01/06/2012  . Breast cancer ([place]) 05/06/2011    Desyre Calma SPTA 07/30/2015, 12:03 PM  [place], [place], [address] Phone: [phone]   Fax:  [phone]  Name: Mareena Zuleger MRN: [id number] Date of Birth: 1951/02/04

## 2015-07-31 ENCOUNTER — Encounter: Payer: Self-pay | Admitting: Physical Therapy

## 2015-07-31 ENCOUNTER — Ambulatory Visit: Payer: Medicare HMO | Admitting: Physical Therapy

## 2015-07-31 DIAGNOSIS — M25561 Pain in right knee: Secondary | ICD-10-CM | POA: Diagnosis not present

## 2015-07-31 DIAGNOSIS — R262 Difficulty in walking, not elsewhere classified: Secondary | ICD-10-CM

## 2015-07-31 DIAGNOSIS — M25661 Stiffness of right knee, not elsewhere classified: Secondary | ICD-10-CM

## 2015-07-31 DIAGNOSIS — R2241 Localized swelling, mass and lump, right lower limb: Secondary | ICD-10-CM

## 2015-07-31 NOTE — Therapy (Signed)
[place], [place], [address] Phone: [phone]   Fax:  [phone]  Physical Therapy Treatment  Patient Details  Name: Victoria Walker MRN: [id number] Date of Birth: 09-08-1950 Referring Provider: Noemi Chapel  Encounter Date: 07/31/2015      PT End of Session - 07/31/15 1100    Visit Number 4   Date for PT Re-Evaluation 09/22/15   PT Start Time [id number]   PT Stop Time 1115   PT Time Calculation (min) 60 min   Activity Tolerance Patient tolerated treatment well   Behavior During Therapy [facility] for tasks assessed/performed      Past Medical History  Diagnosis Date  . Hypertension   . Neuropathy ([place])     FEET  . High cholesterol   . Hypothyroidism   . GERD (gastroesophageal reflux disease)   . Multinodular thyroid     PT HAVING TROUBLE SWALLOWING  . H/O hiatal hernia   . Claustrophobia   . H/O iritis     LEFT EYE - STATES HER EYE BECOMES RED AND VERY SENSITIVE TO LIGHT WHEN FLARE UP OF IRITIS  . History of chickenpox 09/26/2014  . History of measles   . History of shingles   . History of MRSA infection   . Medicare annual wellness visit, initial 04/07/2015  . Dysrhythmia     HX OF FAST HEART RATE AND PALPITATIONS - METOPROLOL HAS HELPED  . Wears glasses   . Spinal headache     spinal headache with epidural  . Heart murmur     "benign; 06/2015"  . Type II diabetes mellitus (HCC)     ORAL MEDICATION - NO INSULIN  . History of blood transfusion     "when I had my partial hysterectomy"  . Arthritis     "both knees" (07/02/2015)  . Primary localized osteoarthritis of right knee   . Cancer of right breast [facility])     Past Surgical History  Procedure Laterality Date  . Total thyroidectomy  1976    removed three tumor (2 on right; 1 on left)  . Plantar fascia surgery Left   . Abdominoplasty  1986  . Hernia repair    . Anterior and posterior repair  05/11/2012    Procedure: ANTERIOR  (CYSTOCELE) AND POSTERIOR REPAIR (RECTOCELE);  Surgeon: Reece Packer, MD;  Location: [facility];  Service: Urology;;  with graft  . Vaginal prolapse repair  05/11/2012    Procedure: VAGINAL VAULT SUSPENSION;  Surgeon: Reece Packer, MD;  Location: [facility];  Service: Urology;;  . Cystoscopy with urethral dilatation  05/11/2012    Procedure: CYSTOSCOPY WITH URETHRAL DILATATION;  Surgeon: Reece Packer, MD;  [facility];  Service: Urology;;  . Thyroidectomy N/A 02/23/2014    Procedure: TOTAL THYROIDECTOMY;  Surgeon: Armandina Gemma, MD;  Location[facility];  Service: General;  Laterality: N/A;  . Vulva / perineum biopsy      vulvar  . Mastectomy Right 2007  . Colonoscopy    . Esophagogastroduodenoscopy    . Total knee arthroplasty Right 07/02/2015  . Umbilical hernia repair  1986  . Breast biopsy Right   . Abdominal hysterectomy  1988  . Tubal ligation    . Cardiac catheterization  1999  . Total knee arthroplasty Right 07/02/2015    Procedure: TOTAL KNEE ARTHROPLASTY;  Surgeon: Elsie Saas, MD;  Location: [facility];  Service: Orthopedics;  Laterality: Right;    There were no vitals filed  for this visit.      Subjective Assessment - 07/31/15 1025    Subjective "Pretty good" Pt reports that she went to [employer] after treatment yesterday and did a lot of walking.    Currently in Pain? No/denies   Pain Score 0-No pain            [facility] PT Assessment - 07/31/15 0001    AROM   AROM Assessment Site Knee   Right/Left Knee Right   Right Knee Extension 11   Right Knee Flexion 100                     [facility] Adult PT Treatment/Exercise - 07/31/15 0001    Ambulation/Gait   Ambulation/Gait Yes   Ambulation/Gait Assistance 6: Modified independent (Device/Increase time);5: Supervision   Ambulation Distance (Feet) 120 Feet   Assistive device None   Gait Pattern Step-through pattern;Decreased arm swing - right;Decreased arm swing - left;Lateral trunk lean to right   Gait  Comments x2    Knee/Hip Exercises: Aerobic   Stationary Bike .5 revolutions x3 min   Nustep level 4 x 8 minutes   Knee/Hip Exercises: Machines for Strengthening   Cybex Leg Press 20# 2x15   Knee/Hip Exercises: Seated   Long Arc Quad 2 sets;10 reps   Long Arc Quad Weight 2 lbs.   Hamstring Curl 2 sets;10 reps   Hamstring Limitations yellow tband    Modalities   Modalities Vasopneumatic   Vasopneumatic   Number Minutes Vasopneumatic  15 minutes   Vasopnuematic Location  Knee   Vasopneumatic Pressure Medium   Manual Therapy   Manual Therapy Passive ROM   Manual therapy comments Rt knee   Passive ROM Flexion and extension                  PT Short Term Goals - 07/26/15 1435    PT SHORT TERM GOAL #1   Title independent with initial HEP   Status Achieved           PT Long Term Goals - 07/23/15 1353    PT LONG TERM GOAL #1   Title decrease pain 50%   Time 12   Period Weeks   Status New   PT LONG TERM GOAL #2   Title increase AROM of the right knee to 4-115 degrees flexion   Time 12   Period Weeks   Status New   PT LONG TERM GOAL #3   Title walk witout assistive device   Time 12   Period Weeks   Status New   PT LONG TERM GOAL #4   Title goe up and down steps step over step   Time 12   Period Weeks   Status New   PT LONG TERM GOAL #5   Title walk 600 feet without rest   Time 12   Period Weeks   Status New               Plan - 07/31/15 1105    Clinical Impression Statement Pt reports soreness for yesterdays treatment. Some increase with R knees flexion but remain limited with extension. progressed to ambulation without AD. Pt with decrease arm swing, pt also has R trunk knee during stance phase with RLE.   Rehab Potential Good   PT Frequency 3x / week   PT Treatment/Interventions ADLs/Self Care Home Management;Cryotherapy;Electrical Stimulation;Therapeutic exercise;Therapeutic activities;Functional mobility training;Stair training;Gait  training;Balance training;Neuromuscular re-education;Patient/family education;Manual techniques;Passive range of motion   PT Next Visit  Plan Progress ther ex and gait without AD, focusing on knee flexion during swing phase. Manual ROM to increase flexion.      Patient will benefit from skilled therapeutic intervention in order to improve the following deficits and impairments:  Abnormal gait, Decreased mobility, Decreased range of motion, Decreased scar mobility, Decreased strength, Increased edema, Difficulty walking, Increased muscle spasms, Impaired flexibility, Pain  Visit Diagnosis: Stiffness of right knee, not elsewhere classified  Pain in right knee     Problem List Patient Active Problem List   Diagnosis Date Noted  . Primary localized osteoarthritis of right knee   . History of MRSA infection   . History of measles   . Dysrhythmia   . Chronic rhinitis 06/06/2015  . Shortness of breath 06/06/2015  . Vaginitis and vulvovaginitis 04/25/2015  . Lumbar back pain 04/25/2015  . Medicare annual wellness visit, initial 04/07/2015  . Allergic response 01/01/2015  . History of chickenpox 09/26/2014  . History of shingles   . Diabetes mellitus without complication ([place]) 123XX123  . 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 Deformity of metatarsal bone of left foot 03/03/2014  . Vulvar irritation  S/P neg vulvar bx 12/2013 01/23/2014  . 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 Right knee DJD 07/12/2013  . Neuralgia of left foot 06/24/2013  . Tarsal tunnel syndrome 04/08/2013  . Vaginal dryness, menopausal 04/05/2013  . Pronation deformity of ankle, acquired 02/09/2013  . Cortical cataract 02/01/2013  . H/O bone density study 01/26/2013  . Chest pain 01/05/2013  . History of iritis 01/04/2013  . Dry eye syndrome 10/07/2012  . Meibomian gland disease 10/07/2012  . GERD (gastroesophageal reflux disease) 10/05/2012  . Essential  hypertension, benign 10/04/2012  . Hyperlipidemia, mixed 10/04/2012  . 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 Diabetic neuropathy ([place]) 10/04/2012  . S/P hysterectomy 10/04/2012  . Osteopenia 10/04/2012  . Hypothyroidism 10/04/2012  . History of cystocele 10/04/2012  . Dyspareunia in female 10/04/2012  . Plantar fasciitis 10/04/2012  . Nuclear sclerotic cataract 06/17/2012  . Diabetes mellitus ([place]) 03/02/2012  . BP (high blood pressure) 01/06/2012  . Iritis 01/06/2012  . Neuropathy ([place]) 01/06/2012  . Disease of thyroid gland 01/06/2012  . Breast cancer ([place]) 05/06/2011    Scot Jun , PTA   07/31/2015, 11:08 AM  [facility] [place], [place], [address] Phone: [phone]   Fax:  [phone]  Name: Neah Nader MRN: [id number] Date of Birth: 01-15-1951

## 2015-08-01 ENCOUNTER — Ambulatory Visit: Payer: Medicare HMO | Admitting: Physical Therapy

## 2015-08-02 ENCOUNTER — Encounter: Payer: Self-pay | Admitting: Physical Therapy

## 2015-08-02 ENCOUNTER — Ambulatory Visit: Payer: Medicare HMO | Admitting: Physical Therapy

## 2015-08-02 DIAGNOSIS — M25661 Stiffness of right knee, not elsewhere classified: Secondary | ICD-10-CM

## 2015-08-02 DIAGNOSIS — M25561 Pain in right knee: Secondary | ICD-10-CM | POA: Diagnosis not present

## 2015-08-02 DIAGNOSIS — R2241 Localized swelling, mass and lump, right lower limb: Secondary | ICD-10-CM

## 2015-08-02 DIAGNOSIS — R262 Difficulty in walking, not elsewhere classified: Secondary | ICD-10-CM

## 2015-08-02 NOTE — Therapy (Signed)
[place], [place], [address] Phone: [phone]   Fax:  [phone]  Physical Therapy Treatment  Patient Details  Name: Victoria Walker MRN: [id number] Date of Birth: 26-Jun-1950 Referring Provider: Noemi Chapel  Encounter Date: 08/02/2015      PT End of Session - 08/02/15 1226    Visit Number 5   Date for PT Re-Evaluation 09/22/15   PT Start Time [id number]   PT Stop Time 1245   PT Time Calculation (min) 60 min      Past Medical History  Diagnosis Date  . Hypertension   . Neuropathy ([place])     FEET  . High cholesterol   . Hypothyroidism   . GERD (gastroesophageal reflux disease)   . Multinodular thyroid     PT HAVING TROUBLE SWALLOWING  . H/O hiatal hernia   . Claustrophobia   . H/O iritis     LEFT EYE - STATES HER EYE BECOMES RED AND VERY SENSITIVE TO LIGHT WHEN FLARE UP OF IRITIS  . History of chickenpox 09/26/2014  . History of measles   . History of shingles   . History of MRSA infection   . Medicare annual wellness visit, initial 04/07/2015  . Dysrhythmia     HX OF FAST HEART RATE AND PALPITATIONS - METOPROLOL HAS HELPED  . Wears glasses   . Spinal headache     spinal headache with epidural  . Heart murmur     "benign; 06/2015"  . Type II diabetes mellitus (HCC)     ORAL MEDICATION - NO INSULIN  . History of blood transfusion     "when I had my partial hysterectomy"  . Arthritis     "both knees" (07/02/2015)  . Primary localized osteoarthritis of right knee   . Cancer of right breast [facility])     Past Surgical History  Procedure Laterality Date  . Total thyroidectomy  1976    removed three tumor (2 on right; 1 on left)  . Plantar fascia surgery Left   . Abdominoplasty  1986  . Hernia repair    . Anterior and posterior repair  05/11/2012    Procedure: ANTERIOR (CYSTOCELE) AND POSTERIOR REPAIR (RECTOCELE);  Surgeon: Reece Packer, MD;  [facility];  Service: Urology;;   with graft  . Vaginal prolapse repair  05/11/2012    Procedure: VAGINAL VAULT SUSPENSION;  Surgeon: Reece Packer, MD;  [facility];  Service: Urology;;  . Cystoscopy with urethral dilatation  05/11/2012    Procedure: CYSTOSCOPY WITH URETHRAL DILATATION;  Surgeon: Reece Packer, MD;  [facility];  Service: Urology;;  . Thyroidectomy N/A 02/23/2014    Procedure: TOTAL THYROIDECTOMY;  Surgeon: Armandina Gemma, MD;  [facility];  Service: General;  Laterality: N/A;  . Vulva / perineum biopsy      vulvar  . Mastectomy Right 2007  . Colonoscopy    . Esophagogastroduodenoscopy    . Total knee arthroplasty Right 07/02/2015  . Umbilical hernia repair  1986  . Breast biopsy Right   . Abdominal hysterectomy  1988  . Tubal ligation    . Cardiac catheterization  1999  . Total knee arthroplasty Right 07/02/2015    Procedure: TOTAL KNEE ARTHROPLASTY;  Surgeon: Elsie Saas, MD;  Location: [facility];  Service: Orthopedics;  Laterality: Right;    There were no vitals filed for this visit.      Subjective Assessment - 08/02/15 1146    Subjective "  That knee a little stiff today, I get them sharp pains in it"   Currently in Pain? No/denies   Pain Score 0-No pain                         [facility] Adult PT Treatment/Exercise - 08/02/15 0001    Knee/Hip Exercises: Aerobic   Nustep level 5 x 7 minutes   Knee/Hip Exercises: Machines for Strengthening   Cybex Leg Press 30# 2x15, RLE only x10 no weight.   Knee/Hip Exercises: Seated   Long Arc Quad 2 sets;10 reps   Long Arc Quad Weight 3 lbs.   Hamstring Curl 2 sets;Right;15 reps   Hamstring Limitations yellow tband    Knee/Hip Exercises: Supine   Other Supine Knee/Hip Exercises Seated Eccentric TKE 3 sec x10   Modalities   Modalities Vasopneumatic   Vasopneumatic   Number Minutes Vasopneumatic  15 minutes   Vasopnuematic Location  Knee   Vasopneumatic Pressure Medium   Manual Therapy   Manual Therapy Passive ROM    Manual therapy comments Rt knee   Passive ROM Flexion and extension                  PT Short Term Goals - 07/26/15 1435    PT SHORT TERM GOAL #1   Title independent with initial HEP   Status Achieved           PT Long Term Goals - 07/23/15 1353    PT LONG TERM GOAL #1   Title decrease pain 50%   Time 12   Period Weeks   Status New   PT LONG TERM GOAL #2   Title increase AROM of the right knee to 4-115 degrees flexion   Time 12   Period Weeks   Status New   PT LONG TERM GOAL #3   Title walk witout assistive device   Time 12   Period Weeks   Status New   PT LONG TERM GOAL #4   Title goe up and down steps step over step   Time 12   Period Weeks   Status New   PT LONG TERM GOAL #5   Title walk 600 feet without rest   Time 12   Period Weeks   Status New               Plan - 08/02/15 1226    Clinical Impression Statement Pt reports increase tightness for today's treatment, but continues to have good ROM. PROM 0-10 this date. Again ambulation around clinic without RW with god heel toe technique. Performed legg press with RLE only and tolerated it well.   Rehab Potential Good   PT Frequency 3x / week   PT Duration 8 weeks   PT Treatment/Interventions ADLs/Self Care Home Management;Cryotherapy;Electrical Stimulation;Therapeutic exercise;Therapeutic activities;Functional mobility training;Stair training;Gait training;Balance training;Neuromuscular re-education;Patient/family education;Manual techniques;Passive range of motion   PT Next Visit Plan Progress ther ex and gait without AD, focusing on knee flexion during swing phase. Manual ROM to increase flexion.      Patient will benefit from skilled therapeutic intervention in order to improve the following deficits and impairments:  Abnormal gait, Decreased mobility, Decreased range of motion, Decreased scar mobility, Decreased strength, Increased edema, Difficulty walking, Increased muscle spasms,  Impaired flexibility, Pain  Visit Diagnosis: Stiffness of right knee, not elsewhere classified  Localized swelling, mass and lump, right lower limb  Difficulty in walking, not elsewhere classified     Problem  List Patient Active Problem List   Diagnosis Date Noted  . Primary localized osteoarthritis of right knee   . History of MRSA infection   . History of measles   . Dysrhythmia   . Chronic rhinitis 06/06/2015  . Shortness of breath 06/06/2015  . Vaginitis and vulvovaginitis 04/25/2015  . Lumbar back pain 04/25/2015  . Medicare annual wellness visit, initial 04/07/2015  . Allergic response 01/01/2015  . History of chickenpox 09/26/2014  . History of shingles   . Diabetes mellitus without complication ([place]) [id number]  . 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 Deformity of metatarsal bone of left foot 03/03/2014  . Vulvar irritation  S/P neg vulvar bx 12/2013 01/23/2014  . 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 Right knee DJD 07/12/2013  . Neuralgia of left foot 06/24/2013  . Tarsal tunnel syndrome 04/08/2013  . Vaginal dryness, menopausal 04/05/2013  . Pronation deformity of ankle, acquired 02/09/2013  . Cortical cataract 02/01/2013  . H/O bone density study 01/26/2013  . Chest pain 01/05/2013  . History of iritis 01/04/2013  . Dry eye syndrome 10/07/2012  . Meibomian gland disease 10/07/2012  . GERD (gastroesophageal reflux disease) 10/05/2012  . Essential hypertension, benign 10/04/2012  . Hyperlipidemia, mixed 10/04/2012  . 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 Diabetic neuropathy ([place]) 10/04/2012  . S/P hysterectomy 10/04/2012  . Osteopenia 10/04/2012  . Hypothyroidism 10/04/2012  . History of cystocele 10/04/2012  . Dyspareunia in female 10/04/2012  . Plantar fasciitis 10/04/2012  . Nuclear sclerotic cataract 06/17/2012  . Diabetes mellitus ([place]) 03/02/2012  . BP (high blood pressure) 01/06/2012  . Iritis  01/06/2012  . Neuropathy ([place]) 01/06/2012  . Disease of thyroid gland 01/06/2012  . Breast cancer ([place]) 05/06/2011    Scot Jun, PTA  08/02/2015, 12:32 PM  [place], [place], [address] Phone: [phone]   Fax:  [phone]  Name: Victoria Walker MRN: [id number] Date of Birth: 1950/12/12

## 2015-08-06 ENCOUNTER — Encounter: Payer: Self-pay | Admitting: Physical Therapy

## 2015-08-06 ENCOUNTER — Ambulatory Visit: Payer: Medicare HMO | Admitting: Physical Therapy

## 2015-08-06 DIAGNOSIS — R2241 Localized swelling, mass and lump, right lower limb: Secondary | ICD-10-CM

## 2015-08-06 DIAGNOSIS — M25661 Stiffness of right knee, not elsewhere classified: Secondary | ICD-10-CM

## 2015-08-06 DIAGNOSIS — R262 Difficulty in walking, not elsewhere classified: Secondary | ICD-10-CM

## 2015-08-06 DIAGNOSIS — M25561 Pain in right knee: Secondary | ICD-10-CM | POA: Diagnosis not present

## 2015-08-06 NOTE — Therapy (Signed)
[place], [place], [address] Phone: [phone]   Fax:  [phone]  Physical Therapy Treatment  Patient Details  Name: Victoria Walker MRN: [id number] Date of Birth: 1951/03/07 Referring Provider: Noemi Chapel  Encounter Date: 08/06/2015      PT End of Session - 08/06/15 1555    Visit Number 6   Date for PT Re-Evaluation 09/22/15   PT Start Time 1508   PT Stop Time 1604   PT Time Calculation (min) 56 min   Activity Tolerance Patient tolerated treatment well   Behavior During Therapy [facility] for tasks assessed/performed      Past Medical History  Diagnosis Date  . Hypertension   . Neuropathy ([place])     FEET  . High cholesterol   . Hypothyroidism   . GERD (gastroesophageal reflux disease)   . Multinodular thyroid     PT HAVING TROUBLE SWALLOWING  . H/O hiatal hernia   . Claustrophobia   . H/O iritis     LEFT EYE - STATES HER EYE BECOMES RED AND VERY SENSITIVE TO LIGHT WHEN FLARE UP OF IRITIS  . History of chickenpox 09/26/2014  . History of measles   . History of shingles   . History of MRSA infection   . Medicare annual wellness visit, initial 04/07/2015  . Dysrhythmia     HX OF FAST HEART RATE AND PALPITATIONS - METOPROLOL HAS HELPED  . Wears glasses   . Spinal headache     spinal headache with epidural  . Heart murmur     "benign; 06/2015"  . Type II diabetes mellitus (HCC)     ORAL MEDICATION - NO INSULIN  . History of blood transfusion     "when I had my partial hysterectomy"  . Arthritis     "both knees" (07/02/2015)  . Primary localized osteoarthritis of right knee   . Cancer of right breast [facility])     Past Surgical History  Procedure Laterality Date  . Total thyroidectomy  1976    removed three tumor (2 on right; 1 on left)  . Plantar fascia surgery Left   . Abdominoplasty  1986  . Hernia repair    . Anterior and posterior repair  05/11/2012    Procedure: ANTERIOR  (CYSTOCELE) AND POSTERIOR REPAIR (RECTOCELE);  Surgeon: Reece Packer, MD;  [facility]: [facility];  Service: Urology;;  with graft  . Vaginal prolapse repair  05/11/2012    Procedure: VAGINAL VAULT SUSPENSION;  Surgeon: Reece Packer, MD;  [facility];  Service: Urology;;  . Cystoscopy with urethral dilatation  05/11/2012    Procedure: CYSTOSCOPY WITH URETHRAL DILATATION;  Surgeon: Reece Packer, MD;  [facility];  Service: Urology;;  . Thyroidectomy N/A 02/23/2014    Procedure: TOTAL THYROIDECTOMY;  Surgeon: Armandina Gemma, MD;  [facility];  Service: General;  Laterality: N/A;  . Vulva / perineum biopsy      vulvar  . Mastectomy Right 2007  . Colonoscopy    . Esophagogastroduodenoscopy    . Total knee arthroplasty Right 07/02/2015  . Umbilical hernia repair  1986  . Breast biopsy Right   . Abdominal hysterectomy  1988  . Tubal ligation    . Cardiac catheterization  1999  . Total knee arthroplasty Right 07/02/2015    Procedure: TOTAL KNEE ARTHROPLASTY;  Surgeon: Elsie Saas, MD;  Location: [place];  Service: Orthopedics;  Laterality: Right;    There were no vitals filed  for this visit.      Subjective Assessment - 08/06/15 1511    Subjective "I feel pretty good today, just a little stiff and aching."   Currently in Pain? Yes   Pain Score 2    Pain Location Knee   Pain Orientation Right   Pain Descriptors / Indicators Aching                         [facility] Adult PT Treatment/Exercise - 08/06/15 0001    Ambulation/Gait   Ambulation/Gait Yes   Ambulation/Gait Assistance 6: Modified independent (Device/Increase time)   Ambulation Distance (Feet) 300 Feet   Assistive device None   Gait Pattern Step-through pattern;Decreased step length - left   Gait Comments Decreased toe off   Knee/Hip Exercises: Aerobic   Nustep level 5 x 8 minutes   Knee/Hip Exercises: Standing   Knee Flexion Right;2 sets;10 reps  active assistive at end range to increase  knee flexion   Knee/Hip Exercises: Seated   Long Arc Quad 2 sets;15 reps;Weights   Long Arc Quad Weight 3 lbs.   Hamstring Curl 2 sets;15 reps   Hamstring Limitations red tband   Modalities   Modalities Vasopneumatic   Vasopneumatic   Number Minutes Vasopneumatic  15 minutes   Vasopnuematic Location  Knee   Vasopneumatic Pressure Medium   Manual Therapy   Manual Therapy Passive ROM   Manual therapy comments Rt knee   Passive ROM Flexion and extension                  PT Short Term Goals - 07/26/15 1435    PT SHORT TERM GOAL #1   Title independent with initial HEP   Status Achieved           PT Long Term Goals - 07/23/15 1353    PT LONG TERM GOAL #1   Title decrease pain 50%   Time 12   Period Weeks   Status New   PT LONG TERM GOAL #2   Title increase AROM of the right knee to 4-115 degrees flexion   Time 12   Period Weeks   Status New   PT LONG TERM GOAL #3   Title walk witout assistive device   Time 12   Period Weeks   Status New   PT LONG TERM GOAL #4   Title goe up and down steps step over step   Time 12   Period Weeks   Status New   PT LONG TERM GOAL #5   Title walk 600 feet without rest   Time 12   Period Weeks   Status New               Plan - 08/06/15 1556    Clinical Impression Statement Patient reports aches and tightness today. Continues to have increased ROM and tolerance to increased ther ex. She ambulates without a walker, with only deviation being decreased Left step length and decreased toe off.   PT Treatment/Interventions ADLs/Self Care Home Management;Cryotherapy;Electrical Stimulation;Therapeutic exercise;Therapeutic activities;Functional mobility training;Stair training;Gait training;Balance training;Neuromuscular re-education;Patient/family education;Manual techniques;Passive range of motion   PT Next Visit Plan Continue to increase ther ex and gait without AD, with emphasis on toe off and left step length and knee  flexion during swing phase. Continue PROM to increase knee flexion.      Patient will benefit from skilled therapeutic intervention in order to improve the following deficits and impairments:  Visit Diagnosis: Stiffness of right knee, not elsewhere classified  Localized swelling, mass and lump, right lower limb  Difficulty in walking, not elsewhere classified     Problem List Patient Active Problem List   Diagnosis Date Noted  . Primary localized osteoarthritis of right knee   . History of MRSA infection   . History of measles   . Dysrhythmia   . Chronic rhinitis 06/06/2015  . Shortness of breath 06/06/2015  . Vaginitis and vulvovaginitis 04/25/2015  . Lumbar back pain 04/25/2015  . Medicare annual wellness visit, initial 04/07/2015  . Allergic response 01/01/2015  . History of chickenpox 09/26/2014  . History of shingles   . Diabetes mellitus without complication ([place]) 123XX123  . 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 Deformity of metatarsal bone of left foot 03/03/2014  . Vulvar irritation  S/P neg vulvar bx 12/2013 01/23/2014  . 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 Right knee DJD 07/12/2013  . Neuralgia of left foot 06/24/2013  . Tarsal tunnel syndrome 04/08/2013  . Vaginal dryness, menopausal 04/05/2013  . Pronation deformity of ankle, acquired 02/09/2013  . Cortical cataract 02/01/2013  . H/O bone density study 01/26/2013  . Chest pain 01/05/2013  . History of iritis 01/04/2013  . Dry eye syndrome 10/07/2012  . Meibomian gland disease 10/07/2012  . GERD (gastroesophageal reflux disease) 10/05/2012  . Essential hypertension, benign 10/04/2012  . Hyperlipidemia, mixed 10/04/2012  . 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 Diabetic neuropathy ([place]) 10/04/2012  . S/P hysterectomy 10/04/2012  . Osteopenia 10/04/2012  . Hypothyroidism 10/04/2012  . History of cystocele 10/04/2012  . Dyspareunia in female  10/04/2012  . Plantar fasciitis 10/04/2012  . Nuclear sclerotic cataract 06/17/2012  . Diabetes mellitus ([place]) 03/02/2012  . BP (high blood pressure) 01/06/2012  . Iritis 01/06/2012  . Neuropathy ([place]) 01/06/2012  . Disease of thyroid gland 01/06/2012  . Breast cancer ([place]) 05/06/2011    Mouna Yager SPTA 08/06/2015, 4:03 PM  [place], [place], [address] Phone: [phone]   Fax:  [phone]  Name: Kadince Saeed MRN: [id number] Date of Birth: 08/23/1950

## 2015-08-08 ENCOUNTER — Encounter: Payer: Self-pay | Admitting: Physical Therapy

## 2015-08-08 ENCOUNTER — Ambulatory Visit: Payer: Medicare HMO | Admitting: Physical Therapy

## 2015-08-08 DIAGNOSIS — R2241 Localized swelling, mass and lump, right lower limb: Secondary | ICD-10-CM

## 2015-08-08 DIAGNOSIS — M25661 Stiffness of right knee, not elsewhere classified: Secondary | ICD-10-CM

## 2015-08-08 DIAGNOSIS — M25561 Pain in right knee: Secondary | ICD-10-CM | POA: Diagnosis not present

## 2015-08-08 NOTE — Therapy (Signed)
[place], [place], [address] Phone: [phone]   Fax:  [phone]  Physical Therapy Treatment  Patient Details  Name: Victoria Walker MRN: [id number] Date of Birth: Apr 21, 1951 Referring Provider: Noemi Chapel  Encounter Date: 08/08/2015      PT End of Session - 08/08/15 1610    Visit Number 7   Date for PT Re-Evaluation 09/22/15   PT Start Time [id number]   PT Stop Time 1615   PT Time Calculation (min) 60 min   Activity Tolerance Patient tolerated treatment well   Behavior During Therapy [facility] for tasks assessed/performed      Past Medical History  Diagnosis Date  . Hypertension   . Neuropathy ([place])     FEET  . High cholesterol   . Hypothyroidism   . GERD (gastroesophageal reflux disease)   . Multinodular thyroid     PT HAVING TROUBLE SWALLOWING  . H/O hiatal hernia   . Claustrophobia   . H/O iritis     LEFT EYE - STATES HER EYE BECOMES RED AND VERY SENSITIVE TO LIGHT WHEN FLARE UP OF IRITIS  . History of chickenpox 09/26/2014  . History of measles   . History of shingles   . History of MRSA infection   . Medicare annual wellness visit, initial 04/07/2015  . Dysrhythmia     HX OF FAST HEART RATE AND PALPITATIONS - METOPROLOL HAS HELPED  . Wears glasses   . Spinal headache     spinal headache with epidural  . Heart murmur     "benign; 06/2015"  . Type II diabetes mellitus (HCC)     ORAL MEDICATION - NO INSULIN  . History of blood transfusion     "when I had my partial hysterectomy"  . Arthritis     "both knees" (07/02/2015)  . Primary localized osteoarthritis of right knee   . Cancer of right breast [facility])     Past Surgical History  Procedure Laterality Date  . Total thyroidectomy  1976    removed three tumor (2 on right; 1 on left)  . Plantar fascia surgery Left   . Abdominoplasty  1986  . Hernia repair    . Anterior and posterior repair  05/11/2012    Procedure: ANTERIOR  (CYSTOCELE) AND POSTERIOR REPAIR (RECTOCELE);  Surgeon: Reece Packer, MD;  [facility];  Service: Urology;;  with graft  . Vaginal prolapse repair  05/11/2012    Procedure: VAGINAL VAULT SUSPENSION;  Surgeon: Reece Packer, MD;  [facility];  Service: Urology;;  . Cystoscopy with urethral dilatation  05/11/2012    Procedure: CYSTOSCOPY WITH URETHRAL DILATATION;  Surgeon: Reece Packer, MD;  [facility];  Service: Urology;;  . Thyroidectomy N/A 02/23/2014    Procedure: TOTAL THYROIDECTOMY;  Surgeon: Armandina Gemma, MD;  [facility];  Service: General;  Laterality: N/A;  . Vulva / perineum biopsy      vulvar  . Mastectomy Right 2007  . Colonoscopy    . Esophagogastroduodenoscopy    . Total knee arthroplasty Right 07/02/2015  . Umbilical hernia repair  1986  . Breast biopsy Right   . Abdominal hysterectomy  1988  . Tubal ligation    . Cardiac catheterization  1999  . Total knee arthroplasty Right 07/02/2015    Procedure: TOTAL KNEE ARTHROPLASTY;  Surgeon: Elsie Saas, MD;  Location: [place];  Service: Orthopedics;  Laterality: Right;    There were no vitals filed  for this visit.      Subjective Assessment - 08/08/15 1512    Subjective "This rainy weather got my leg all stiff" Pt reports she can tell she can flex her knee more because she is able to put her shoe on with less difficulty.   Currently in Pain? Yes   Pain Score 2    Pain Location Knee   Pain Orientation Right   Aggravating Factors  Weather                         [facility] Adult PT Treatment/Exercise - 08/08/15 0001    Ambulation/Gait   Ambulation/Gait Yes   Ambulation/Gait Assistance 6: Modified independent (Device/Increase time)   Ambulation Distance (Feet) 300 Feet   Assistive device None   Gait Pattern Step-through pattern;Decreased step length - left   Gait Comments Cues needed to increase speed, Decreased toe off   Knee/Hip Exercises: Aerobic   Nustep level 5 x 8  minutes   Knee/Hip Exercises: Machines for Strengthening   Cybex Leg Press 40# 3x10, RLE only x10 no weight   Knee/Hip Exercises: Standing   Knee Flexion Right;2 sets;10 reps  Active assistive at end range to increase flexion   Knee/Hip Exercises: Seated   Long Arc Quad 2 sets;15 reps;Weights   Long Arc Quad Weight 4 lbs.   Hamstring Curl 2 sets;15 reps   Hamstring Limitations red tband   Modalities   Modalities Vasopneumatic   Vasopneumatic   Number Minutes Vasopneumatic  15 minutes   Vasopnuematic Location  Knee   Vasopneumatic Pressure Medium   Manual Therapy   Manual Therapy Passive ROM   Manual therapy comments Rt knee   Passive ROM Flexion and extension                  PT Short Term Goals - 07/26/15 1435    PT SHORT TERM GOAL #1   Title independent with initial HEP   Status Achieved           PT Long Term Goals - 07/23/15 1353    PT LONG TERM GOAL #1   Title decrease pain 50%   Time 12   Period Weeks   Status New   PT LONG TERM GOAL #2   Title increase AROM of the right knee to 4-115 degrees flexion   Time 12   Period Weeks   Status New   PT LONG TERM GOAL #3   Title walk witout assistive device   Time 12   Period Weeks   Status New   PT LONG TERM GOAL #4   Title goe up and down steps step over step   Time 12   Period Weeks   Status New   PT LONG TERM GOAL #5   Title walk 600 feet without rest   Time 12   Period Weeks   Status New               Plan - 08/08/15 1611    Clinical Impression Statement Patient ambulated in with decreased knee flexion due to reported stiffness. She reports tightness with rainy weather today. Tolerated all ther ex well, placed emphasis on HS strength and increasing knee flexion. Cues needed to increase gait speed while focusing on knee flexion during swing phase.   PT Treatment/Interventions ADLs/Self Care Home Management;Cryotherapy;Electrical Stimulation;Therapeutic exercise;Therapeutic  activities;Functional mobility training;Stair training;Gait training;Balance training;Neuromuscular re-education;Patient/family education;Manual techniques;Passive range of motion   PT Next Visit Plan Progress  ther ex, with emphasis on HS strength and increasing knee flexion ROM. Continue PROM. Gait training to increase knee flexion during swing phase and increase speed.      Patient will benefit from skilled therapeutic intervention in order to improve the following deficits and impairments:     Visit Diagnosis: Stiffness of right knee, not elsewhere classified  Localized swelling, mass and lump, right lower limb     Problem List Patient Active Problem List   Diagnosis Date Noted  . Primary localized osteoarthritis of right knee   . History of MRSA infection   . History of measles   . Dysrhythmia   . Chronic rhinitis 06/06/2015  . Shortness of breath 06/06/2015  . Vaginitis and vulvovaginitis 04/25/2015  . Lumbar back pain 04/25/2015  . Medicare annual wellness visit, initial 04/07/2015  . Allergic response 01/01/2015  . History of chickenpox 09/26/2014  . History of shingles   . Diabetes mellitus without complication ([place]) 123XX123  . 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 Deformity of metatarsal bone of left foot 03/03/2014  . Vulvar irritation  S/P neg vulvar bx 12/2013 01/23/2014  . 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 Right knee DJD 07/12/2013  . Neuralgia of left foot 06/24/2013  . Tarsal tunnel syndrome 04/08/2013  . Vaginal dryness, menopausal 04/05/2013  . Pronation deformity of ankle, acquired 02/09/2013  . Cortical cataract 02/01/2013  . H/O bone density study 01/26/2013  . Chest pain 01/05/2013  . History of iritis 01/04/2013  . Dry eye syndrome 10/07/2012  . Meibomian gland disease 10/07/2012  . GERD (gastroesophageal reflux disease) 10/05/2012  . Essential hypertension, benign 10/04/2012  .  Hyperlipidemia, mixed 10/04/2012  . 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 Diabetic neuropathy ([place]) 10/04/2012  . S/P hysterectomy 10/04/2012  . Osteopenia 10/04/2012  . Hypothyroidism 10/04/2012  . History of cystocele 10/04/2012  . Dyspareunia in female 10/04/2012  . Plantar fasciitis 10/04/2012  . Nuclear sclerotic cataract 06/17/2012  . Diabetes mellitus ([place]) 03/02/2012  . BP (high blood pressure) 01/06/2012  . Iritis 01/06/2012  . Neuropathy ([place]) 01/06/2012  . Disease of thyroid gland 01/06/2012  . Breast cancer ([place]) 05/06/2011    Deardra Hinkley SPTA 08/08/2015, 4:22 PM  [place], [place], [address] Phone: [phone]   Fax:  [phone]  Name: Evadne Isbill MRN: [id number] Date of Birth: 03-Jul-1950

## 2015-08-10 ENCOUNTER — Ambulatory Visit: Payer: Medicare HMO | Admitting: Physical Therapy

## 2015-08-10 ENCOUNTER — Encounter: Payer: Self-pay | Admitting: Physical Therapy

## 2015-08-10 DIAGNOSIS — M25661 Stiffness of right knee, not elsewhere classified: Secondary | ICD-10-CM

## 2015-08-10 DIAGNOSIS — R262 Difficulty in walking, not elsewhere classified: Secondary | ICD-10-CM

## 2015-08-10 DIAGNOSIS — M25561 Pain in right knee: Secondary | ICD-10-CM | POA: Diagnosis not present

## 2015-08-10 DIAGNOSIS — R2241 Localized swelling, mass and lump, right lower limb: Secondary | ICD-10-CM

## 2015-08-10 NOTE — Therapy (Signed)
[place], [place], [address] Phone: [phone]   Fax:  [phone]  Physical Therapy Treatment  Patient Details  Name: Victoria Walker MRN: [id number] Date of Birth: 09-Jul-1950 Referring Provider: Noemi Chapel  Encounter Date: 08/10/2015      PT End of Session - 08/10/15 1142    Visit Number 8   Date for PT Re-Evaluation 09/22/15   PT Start Time 1100   PT Stop Time 1155   PT Time Calculation (min) 55 min   Activity Tolerance Patient tolerated treatment well   Behavior During Therapy [facility] for tasks assessed/performed      Past Medical History  Diagnosis Date  . Hypertension   . Neuropathy ([place])     FEET  . High cholesterol   . Hypothyroidism   . GERD (gastroesophageal reflux disease)   . Multinodular thyroid     PT HAVING TROUBLE SWALLOWING  . H/O hiatal hernia   . Claustrophobia   . H/O iritis     LEFT EYE - STATES HER EYE BECOMES RED AND VERY SENSITIVE TO LIGHT WHEN FLARE UP OF IRITIS  . History of chickenpox 09/26/2014  . History of measles   . History of shingles   . History of MRSA infection   . Medicare annual wellness visit, initial 04/07/2015  . Dysrhythmia     HX OF FAST HEART RATE AND PALPITATIONS - METOPROLOL HAS HELPED  . Wears glasses   . Spinal headache     spinal headache with epidural  . Heart murmur     "benign; 06/2015"  . Type II diabetes mellitus (HCC)     ORAL MEDICATION - NO INSULIN  . History of blood transfusion     "when I had my partial hysterectomy"  . Arthritis     "both knees" (07/02/2015)  . Primary localized osteoarthritis of right knee   . Cancer of right breast [facility])     Past Surgical History  Procedure Laterality Date  . Total thyroidectomy  1976    removed three tumor (2 on right; 1 on left)  . Plantar fascia surgery Left   . Abdominoplasty  1986  . Hernia repair    . Anterior and posterior repair  05/11/2012    Procedure: ANTERIOR  (CYSTOCELE) AND POSTERIOR REPAIR (RECTOCELE);  Surgeon: Reece Packer, MD;  [facility];  Service: Urology;;  with graft  . Vaginal prolapse repair  05/11/2012    Procedure: VAGINAL VAULT SUSPENSION;  Surgeon: Reece Packer, MD;  [facility];  Service: Urology;;  . Cystoscopy with urethral dilatation  05/11/2012    Procedure: CYSTOSCOPY WITH URETHRAL DILATATION;  Surgeon: Reece Packer, MD;  [facility];  Service: Urology;;  . Thyroidectomy N/A 02/23/2014    Procedure: TOTAL THYROIDECTOMY;  Surgeon: Armandina Gemma, MD;  [facility];  Service: General;  Laterality: N/A;  . Vulva / perineum biopsy      vulvar  . Mastectomy Right 2007  . Colonoscopy    . Esophagogastroduodenoscopy    . Total knee arthroplasty Right 07/02/2015  . Umbilical hernia repair  1986  . Breast biopsy Right   . Abdominal hysterectomy  1988  . Tubal ligation    . Cardiac catheterization  1999  . Total knee arthroplasty Right 07/02/2015    Procedure: TOTAL KNEE ARTHROPLASTY;  Surgeon: Elsie Saas, MD;  Location: [facility];  Service: Orthopedics;  Laterality: Right;    There were no vitals filed  for this visit.      Subjective Assessment - 08/10/15 1105    Subjective "I feel good today, just stiff a bit." Pt reports she can put on and tie her shoe easier each day.   Currently in Pain? Yes   Pain Score 2    Pain Location Knee   Pain Orientation Right            [facility] PT Assessment - 08/10/15 0001    AROM   AROM Assessment Site Knee   Right/Left Knee Right   Right Knee Extension 8   Right Knee Flexion 106                     [facility] Adult PT Treatment/Exercise - 08/10/15 0001    Ambulation/Gait   Ambulation/Gait Yes   Ambulation/Gait Assistance 6: Modified independent (Device/Increase time)   Ambulation Distance (Feet) 300 Feet   Assistive device None   Gait Pattern Step-through pattern;Decreased step length - left   Gait Comments Cues needed to increase speed,  Decreased toe off   Knee/Hip Exercises: Aerobic   Nustep level 5 x 8 minutes   Knee/Hip Exercises: Machines for Strengthening   Cybex Leg Press 40# 3x10, RLE only x10 no weight   Knee/Hip Exercises: Standing   Knee Flexion Right;3 sets;10 reps  3#, active assistive at end range   Knee/Hip Exercises: Seated   Long Arc Quad Right;3 sets;10 reps;Weights   Long Arc Quad Weight 4 lbs.   Modalities   Modalities Vasopneumatic   Vasopneumatic   Number Minutes Vasopneumatic  15 minutes   Vasopnuematic Location  Knee   Vasopneumatic Pressure Medium   Manual Therapy   Manual Therapy Passive ROM   Manual therapy comments Rt knee   Passive ROM Flexion and extension                  PT Short Term Goals - 07/26/15 1435    PT SHORT TERM GOAL #1   Title independent with initial HEP   Status Achieved           PT Long Term Goals - 07/23/15 1353    PT LONG TERM GOAL #1   Title decrease pain 50%   Time 12   Period Weeks   Status New   PT LONG TERM GOAL #2   Title increase AROM of the right knee to 4-115 degrees flexion   Time 12   Period Weeks   Status New   PT LONG TERM GOAL #3   Title walk witout assistive device   Time 12   Period Weeks   Status New   PT LONG TERM GOAL #4   Title goe up and down steps step over step   Time 12   Period Weeks   Status New   PT LONG TERM GOAL #5   Title walk 600 feet without rest   Time 12   Period Weeks   Status New               Plan - 08/10/15 1142    Clinical Impression Statement Pt ambulated in with stiffness today, which resolved through ther ex. Focused on HS and quad strength to increase knee AROM. MT to increase knee ROM. Vasopneumatic to decrease swelling post treatment. Cues needed to increase gait speed with focus on knee flexion during swing phase.    PT Treatment/Interventions ADLs/Self Care Home Management;Cryotherapy;Electrical Stimulation;Therapeutic exercise;Therapeutic activities;Functional mobility  training;Stair training;Gait training;Balance training;Neuromuscular re-education;Patient/family education;Manual techniques;Passive  range of motion   PT Next Visit Plan Progress ther ex and gait training. Continue PROM to increase knee ROM.       Patient will benefit from skilled therapeutic intervention in order to improve the following deficits and impairments:     Visit Diagnosis: Stiffness of right knee, not elsewhere classified  Localized swelling, mass and lump, right lower limb  Difficulty in walking, not elsewhere classified     Problem List Patient Active Problem List   Diagnosis Date Noted  . Primary localized osteoarthritis of right knee   . History of MRSA infection   . History of measles   . Dysrhythmia   . Chronic rhinitis 06/06/2015  . Shortness of breath 06/06/2015  . Vaginitis and vulvovaginitis 04/25/2015  . Lumbar back pain 04/25/2015  . Medicare annual wellness visit, initial 04/07/2015  . Allergic response 01/01/2015  . History of chickenpox 09/26/2014  . History of shingles   . Diabetes mellitus without complication ([place]) 123XX123  . 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 Deformity of metatarsal bone of left foot 03/03/2014  . Vulvar irritation  S/P neg vulvar bx 12/2013 01/23/2014  . 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 Right knee DJD 07/12/2013  . Neuralgia of left foot 06/24/2013  . Tarsal tunnel syndrome 04/08/2013  . Vaginal dryness, menopausal 04/05/2013  . Pronation deformity of ankle, acquired 02/09/2013  . Cortical cataract 02/01/2013  . H/O bone density study 01/26/2013  . Chest pain 01/05/2013  . History of iritis 01/04/2013  . Dry eye syndrome 10/07/2012  . Meibomian gland disease 10/07/2012  . GERD (gastroesophageal reflux disease) 10/05/2012  . Essential hypertension, benign 10/04/2012  . Hyperlipidemia, mixed 10/04/2012  . 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 Diabetic  neuropathy ([place]) 10/04/2012  . S/P hysterectomy 10/04/2012  . Osteopenia 10/04/2012  . Hypothyroidism 10/04/2012  . History of cystocele 10/04/2012  . Dyspareunia in female 10/04/2012  . Plantar fasciitis 10/04/2012  . Nuclear sclerotic cataract 06/17/2012  . Diabetes mellitus ([place]) 03/02/2012  . BP (high blood pressure) 01/06/2012  . Iritis 01/06/2012  . Neuropathy ([place]) 01/06/2012  . Disease of thyroid gland 01/06/2012  . Breast cancer ([place]) 05/06/2011    Whyatt Klinger SPTA 08/10/2015, 11:49 AM  [place], [place], [address] Phone: [phone]   Fax:  [phone]  Name: Victoria Walker MRN: [id number] Date of Birth: [birth year]

## 2015-08-13 ENCOUNTER — Encounter: Payer: Self-pay | Admitting: Physical Therapy

## 2015-08-13 ENCOUNTER — Ambulatory Visit: Payer: Medicare HMO | Admitting: Physical Therapy

## 2015-08-13 DIAGNOSIS — R2241 Localized swelling, mass and lump, right lower limb: Secondary | ICD-10-CM

## 2015-08-13 DIAGNOSIS — M25561 Pain in right knee: Secondary | ICD-10-CM | POA: Diagnosis not present

## 2015-08-13 DIAGNOSIS — R262 Difficulty in walking, not elsewhere classified: Secondary | ICD-10-CM

## 2015-08-13 DIAGNOSIS — M25661 Stiffness of right knee, not elsewhere classified: Secondary | ICD-10-CM

## 2015-08-13 NOTE — Therapy (Cosign Needed)
[place], [place], [address] Phone: [phone]   Fax:  [phone]  Physical Therapy Treatment  Patient Details  Name: Victoria Walker MRN: [id number] Date of Birth: May 11, 1950 Referring Provider: Noemi Chapel  Encounter Date: 08/13/2015      PT End of Session - 08/13/15 1531    Visit Number 9   Date for PT Re-Evaluation 09/22/15   PT Start Time 1430   PT Stop Time 1530   PT Time Calculation (min) 60 min   Activity Tolerance Patient tolerated treatment well   Behavior During Therapy [facility] for tasks assessed/performed      Past Medical History  Diagnosis Date  . Hypertension   . Neuropathy ([place])     FEET  . High cholesterol   . Hypothyroidism   . GERD (gastroesophageal reflux disease)   . Multinodular thyroid     PT HAVING TROUBLE SWALLOWING  . H/O hiatal hernia   . Claustrophobia   . H/O iritis     LEFT EYE - STATES HER EYE BECOMES RED AND VERY SENSITIVE TO LIGHT WHEN FLARE UP OF IRITIS  . History of chickenpox 09/26/2014  . History of measles   . History of shingles   . History of MRSA infection   . Medicare annual wellness visit, initial 04/07/2015  . Dysrhythmia     HX OF FAST HEART RATE AND PALPITATIONS - METOPROLOL HAS HELPED  . Wears glasses   . Spinal headache     spinal headache with epidural  . Heart murmur     "benign; 06/2015"  . Type II diabetes mellitus (HCC)     ORAL MEDICATION - NO INSULIN  . History of blood transfusion     "when I had my partial hysterectomy"  . Arthritis     "both knees" (07/02/2015)  . Primary localized osteoarthritis of right knee   . Cancer of right breast [facility])     Past Surgical History  Procedure Laterality Date  . Total thyroidectomy  1976    removed three tumor (2 on right; 1 on left)  . Plantar fascia surgery Left   . Abdominoplasty  1986  . Hernia repair    . Anterior and posterior repair  05/11/2012    Procedure: ANTERIOR  (CYSTOCELE) AND POSTERIOR REPAIR (RECTOCELE);  Surgeon: Reece Packer, MD;  [facility];  Service: Urology;;  with graft  . Vaginal prolapse repair  05/11/2012    Procedure: VAGINAL VAULT SUSPENSION;  Surgeon: Reece Packer, MD;  [facility];  Service: Urology;;  . Cystoscopy with urethral dilatation  05/11/2012    Procedure: CYSTOSCOPY WITH URETHRAL DILATATION;  Surgeon: Reece Packer, MD;  [facility];  Service: Urology;;  . Thyroidectomy N/A 02/23/2014    Procedure: TOTAL THYROIDECTOMY;  Surgeon: Armandina Gemma, MD;  [facility];  Service: General;  Laterality: N/A;  . Vulva / perineum biopsy      vulvar  . Mastectomy Right 2007  . Colonoscopy    . Esophagogastroduodenoscopy    . Total knee arthroplasty Right 07/02/2015  . Umbilical hernia repair  1986  . Breast biopsy Right   . Abdominal hysterectomy  1988  . Tubal ligation    . Cardiac catheterization  1999  . Total knee arthroplasty Right 07/02/2015    Procedure: TOTAL KNEE ARTHROPLASTY;  Surgeon: Elsie Saas, MD;  Location: [facility];  Service: Orthopedics;  Laterality: Right;    There were no vitals filed  for this visit.      Subjective Assessment - 08/13/15 1435    Subjective "Oh, I'm stiff today." States she is ready to see her doctor tomorrow and see what he thinks.   Currently in Pain? Yes   Pain Score 3             [facility] PT Assessment - 08/13/15 0001    AROM   AROM Assessment Site Knee   Right/Left Knee Right   Right Knee Extension 6   Right Knee Flexion 109                     [facility] Adult PT Treatment/Exercise - 08/13/15 0001    Ambulation/Gait   Ambulation/Gait Yes   Ambulation/Gait Assistance 6: Modified independent (Device/Increase time)   Ambulation Distance (Feet) 600 Feet   Assistive device None   Gait Pattern Step-through pattern;Decreased step length - left   Gait Comments Cues needed to increase speed   Knee/Hip Exercises: Aerobic   Nustep level 7 x 8  minutes   Knee/Hip Exercises: Machines for Strengthening   Cybex Leg Press 60# 3x10, RLE only 20# x10   Knee/Hip Exercises: Standing   Knee Flexion Right;3 sets;10 reps   Knee Flexion Limitations 4   Knee/Hip Exercises: Seated   Long Arc Quad Right;3 sets;10 reps;Weights   Long Arc Quad Weight 5 lbs.   Modalities   Modalities Vasopneumatic   Vasopneumatic   Number Minutes Vasopneumatic  15 minutes   Vasopnuematic Location  Knee   Vasopneumatic Pressure Medium   Manual Therapy   Manual Therapy Passive ROM   Manual therapy comments Rt knee   Passive ROM Flexion and extension                  PT Short Term Goals - 07/26/15 1435    PT SHORT TERM GOAL #1   Title independent with initial HEP   Status Achieved           PT Long Term Goals - 08/13/15 1541    PT LONG TERM GOAL #3   Title walk witout assistive device   Status Achieved   PT LONG TERM GOAL #5   Title walk 600 feet without rest   Status Achieved               Plan - 08/13/15 1532    Clinical Impression Statement Ambulated in with stiffness, claiming weather had her knee tight. Focused on HS and quad strength to improve AROM. Knee AROM is 6-109 today. Pt sees doctor tomorrow morning, sent MD note. Cues needed during gait to increase speed and trust in left knee.    PT Treatment/Interventions ADLs/Self Care Home Management;Cryotherapy;Electrical Stimulation;Therapeutic exercise;Therapeutic activities;Functional mobility training;Stair training;Gait training;Balance training;Neuromuscular re-education;Patient/family education;Manual techniques;Passive range of motion   PT Next Visit Plan Progress ther ex with emphasis on HS strength to increase AROM flexion. Continue gait and PROM.      Patient will benefit from skilled therapeutic intervention in order to improve the following deficits and impairments:     Visit Diagnosis: Stiffness of right knee, not elsewhere classified  Localized swelling,  mass and lump, right lower limb  Difficulty in walking, not elsewhere classified     Problem List Patient Active Problem List   Diagnosis Date Noted  . Primary localized osteoarthritis of right knee   . History of MRSA infection   . History of measles   . Dysrhythmia   . Chronic rhinitis 06/06/2015  .  Shortness of breath 06/06/2015  . Vaginitis and vulvovaginitis 04/25/2015  . Lumbar back pain 04/25/2015  . Medicare annual wellness visit, initial 04/07/2015  . Allergic response 01/01/2015  . History of chickenpox 09/26/2014  . History of shingles   . Diabetes mellitus without complication ([place]) 123XX123  . 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 Deformity of metatarsal bone of left foot 03/03/2014  . Vulvar irritation  S/P neg vulvar bx 12/2013 01/23/2014  . 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 Right knee DJD 07/12/2013  . Neuralgia of left foot 06/24/2013  . Tarsal tunnel syndrome 04/08/2013  . Vaginal dryness, menopausal 04/05/2013  . Pronation deformity of ankle, acquired 02/09/2013  . Cortical cataract 02/01/2013  . H/O bone density study 01/26/2013  . Chest pain 01/05/2013  . History of iritis 01/04/2013  . Dry eye syndrome 10/07/2012  . Meibomian gland disease 10/07/2012  . GERD (gastroesophageal reflux disease) 10/05/2012  . Essential hypertension, benign 10/04/2012  . Hyperlipidemia, mixed 10/04/2012  . 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 Diabetic neuropathy ([place]) 10/04/2012  . S/P hysterectomy 10/04/2012  . Osteopenia 10/04/2012  . Hypothyroidism 10/04/2012  . History of cystocele 10/04/2012  . Dyspareunia in female 10/04/2012  . Plantar fasciitis 10/04/2012  . Nuclear sclerotic cataract 06/17/2012  . Diabetes mellitus ([place]) 03/02/2012  . BP (high blood pressure) 01/06/2012  . Iritis 01/06/2012  . Neuropathy ([place]) 01/06/2012  . Disease of thyroid gland 01/06/2012  . Breast cancer ([place]) 05/06/2011     Nour Scalise SPTA 08/13/2015, 3:42 PM  [place], [place], [address] Phone: [phone]   Fax:  [phone]  Name: Donnel Stachowicz MRN: [id number] Date of Birth: [birth year]

## 2015-08-14 DIAGNOSIS — Z96651 Presence of right artificial knee joint: Secondary | ICD-10-CM | POA: Diagnosis not present

## 2015-08-15 ENCOUNTER — Encounter: Payer: Self-pay | Admitting: Physical Therapy

## 2015-08-15 ENCOUNTER — Ambulatory Visit: Payer: Medicare HMO | Admitting: Physical Therapy

## 2015-08-15 ENCOUNTER — Other Ambulatory Visit: Payer: Self-pay | Admitting: Family Medicine

## 2015-08-15 DIAGNOSIS — R2241 Localized swelling, mass and lump, right lower limb: Secondary | ICD-10-CM

## 2015-08-15 DIAGNOSIS — R262 Difficulty in walking, not elsewhere classified: Secondary | ICD-10-CM

## 2015-08-15 DIAGNOSIS — M25561 Pain in right knee: Secondary | ICD-10-CM

## 2015-08-15 DIAGNOSIS — M25661 Stiffness of right knee, not elsewhere classified: Secondary | ICD-10-CM

## 2015-08-15 NOTE — Therapy (Signed)
[place], [place], [address] Phone: [phone]   Fax:  [phone]  Physical Therapy Treatment  Patient Details  Name: Victoria Walker MRN: [id number] Date of Birth: [birth year] Referring Provider: Noemi Chapel  Encounter Date: 08/15/2015      PT End of Session - 08/15/15 1528    Visit Number 10   Date for PT Re-Evaluation 09/22/15   PT Start Time [id number]   PT Stop Time 1540   PT Time Calculation (min) 80 min      Past Medical History  Diagnosis Date  . Hypertension   . Neuropathy ([place])     FEET  . High cholesterol   . Hypothyroidism   . GERD (gastroesophageal reflux disease)   . Multinodular thyroid     PT HAVING TROUBLE SWALLOWING  . H/O hiatal hernia   . Claustrophobia   . H/O iritis     LEFT EYE - STATES HER EYE BECOMES RED AND VERY SENSITIVE TO LIGHT WHEN FLARE UP OF IRITIS  . History of chickenpox 09/26/2014  . History of measles   . History of shingles   . History of MRSA infection   . Medicare annual wellness visit, initial 04/07/2015  . Dysrhythmia     HX OF FAST HEART RATE AND PALPITATIONS - METOPROLOL HAS HELPED  . Wears glasses   . Spinal headache     spinal headache with epidural  . Heart murmur     "benign; 06/2015"  . Type II diabetes mellitus (HCC)     ORAL MEDICATION - NO INSULIN  . History of blood transfusion     "when I had my partial hysterectomy"  . Arthritis     "both knees" (07/02/2015)  . Primary localized osteoarthritis of right knee   . Cancer of right breast [facility])     Past Surgical History  Procedure Laterality Date  . Total thyroidectomy  1976    removed three tumor (2 on right; 1 on left)  . Plantar fascia surgery Left   . Abdominoplasty  1986  . Hernia repair    . Anterior and posterior repair  05/11/2012    Procedure: ANTERIOR (CYSTOCELE) AND POSTERIOR REPAIR (RECTOCELE);  Surgeon: Reece Packer, MD;  [facility];  Service: Urology;;   with graft  . Vaginal prolapse repair  05/11/2012    Procedure: VAGINAL VAULT SUSPENSION;  Surgeon: Reece Packer, MD;  [facility];  Service: Urology;;  . Cystoscopy with urethral dilatation  05/11/2012    Procedure: CYSTOSCOPY WITH URETHRAL DILATATION;  Surgeon: Reece Packer, MD;  [facility];  Service: Urology;;  . Thyroidectomy N/A 02/23/2014    Procedure: TOTAL THYROIDECTOMY;  Surgeon: Armandina Gemma, MD;  [facility];  Service: General;  Laterality: N/A;  . Vulva / perineum biopsy      vulvar  . Mastectomy Right 2007  . Colonoscopy    . Esophagogastroduodenoscopy    . Total knee arthroplasty Right 07/02/2015  . Umbilical hernia repair  1986  . Breast biopsy Right   . Abdominal hysterectomy  1988  . Tubal ligation    . Cardiac catheterization  1999  . Total knee arthroplasty Right 07/02/2015    Procedure: TOTAL KNEE ARTHROPLASTY;  Surgeon: Elsie Saas, MD;  Location: [facility];  Service: Orthopedics;  Laterality: Right;    There were no vitals filed for this visit.      Subjective Assessment - 08/15/15 1419    Subjective "  I got a good report from the doctor, He gave me 4 more weeks"   Currently in Pain? No/denies   Pain Score 0-No pain                         [facility] Adult PT Treatment/Exercise - 08/15/15 0001    Knee/Hip Exercises: Aerobic   Recumbent Bike L0 x3 min   Full revolutions, minor L hip hike   Nustep level 7 x 8 minutes   Knee/Hip Exercises: Machines for Strengthening   Cybex Knee Flexion 25# 2x15;  RLE only #10 x10    Cybex Leg Press 60# 3x10, RLE only no weight x10 stretch   Knee/Hip Exercises: Standing   Forward Step Up 2 sets;Both;10 reps;Hand Hold: 0;Step Height: 4"; 2 sets; 5 reps; both; Step Height: 6"    Modalities   Modalities Vasopneumatic   Vasopneumatic   Number Minutes Vasopneumatic  15 minutes   Vasopnuematic Location  Knee   Vasopneumatic Pressure Medium                  PT Short Term Goals -  07/26/15 1435    PT SHORT TERM GOAL #1   Title independent with initial HEP   Status Achieved           PT Long Term Goals - 08/13/15 1541    PT LONG TERM GOAL #3   Title walk witout assistive device   Status Achieved   PT LONG TERM GOAL #5   Title walk 600 feet without rest   Status Achieved               Plan - 08/15/15 1531    Clinical Impression Statement Md pleased with pt progress. Performed all interventions this date but required increase time. Pt needed max encouragement to perform step up intervention. During step ups cues needed to push down through RLE when stepping up. Cues also provided to bend RLE when stepping up with LLE instead having a stiff leg. Pt has also progress to single leg strengthening with machine level knee flexion and extension.    Rehab Potential Good   PT Frequency 3x / week   PT Duration 8 weeks   PT Treatment/Interventions ADLs/Self Care Home Management;Cryotherapy;Electrical Stimulation;Therapeutic exercise;Therapeutic activities;Functional mobility training;Stair training;Gait training;Balance training;Neuromuscular re-education;Patient/family education;Manual techniques;Passive range of motion   PT Next Visit Plan Progress ther ex with emphasis on HS strength to increase AROM flexion. Continue gait and PROM.      Patient will benefit from skilled therapeutic intervention in order to improve the following deficits and impairments:  Abnormal gait, Decreased mobility, Decreased range of motion, Decreased scar mobility, Decreased strength, Increased edema, Difficulty walking, Increased muscle spasms, Impaired flexibility, Pain  Visit Diagnosis: Stiffness of right knee, not elsewhere classified  Localized swelling, mass and lump, right lower limb  Difficulty in walking, not elsewhere classified  Pain in right knee     Problem List Patient Active Problem List   Diagnosis Date Noted  . Primary localized osteoarthritis of right knee    . History of MRSA infection   . History of measles   . Dysrhythmia   . Chronic rhinitis 06/06/2015  . Shortness of breath 06/06/2015  . Vaginitis and vulvovaginitis 04/25/2015  . Lumbar back pain 04/25/2015  . Medicare annual wellness visit, initial 04/07/2015  . Allergic response 01/01/2015  . History of chickenpox 09/26/2014  . History of shingles   . Diabetes mellitus without complication ([place])  05/25/2014  . 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 Deformity of metatarsal bone of left foot 03/03/2014  . Vulvar irritation  S/P neg vulvar bx 12/2013 01/23/2014  . 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 Right knee DJD 07/12/2013  . Neuralgia of left foot 06/24/2013  . Tarsal tunnel syndrome 04/08/2013  . Vaginal dryness, menopausal 04/05/2013  . Pronation deformity of ankle, acquired 02/09/2013  . Cortical cataract 02/01/2013  . H/O bone density study 01/26/2013  . Chest pain 01/05/2013  . History of iritis 01/04/2013  . Dry eye syndrome 10/07/2012  . Meibomian gland disease 10/07/2012  . GERD (gastroesophageal reflux disease) 10/05/2012  . Essential hypertension, benign 10/04/2012  . Hyperlipidemia, mixed 10/04/2012  . 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 Diabetic neuropathy ([place]) 10/04/2012  . S/P hysterectomy 10/04/2012  . Osteopenia 10/04/2012  . Hypothyroidism 10/04/2012  . History of cystocele 10/04/2012  . Dyspareunia in female 10/04/2012  . Plantar fasciitis 10/04/2012  . Nuclear sclerotic cataract 06/17/2012  . Diabetes mellitus ([place]) 03/02/2012  . BP (high blood pressure) 01/06/2012  . Iritis 01/06/2012  . Neuropathy ([facility]) 01/06/2012  . Disease of thyroid gland 01/06/2012  . Breast cancer ([place]) 05/06/2011    Scot Jun, PTA  08/15/2015, 3:38 PM  [place], [place], [address] Phone: [phone]   Fax:   [phone]  Name: Victoria Walker MRN: [id number] Date of Birth: 11-Oct-1950

## 2015-08-17 ENCOUNTER — Ambulatory Visit: Payer: Medicare HMO | Admitting: Physical Therapy

## 2015-08-17 ENCOUNTER — Encounter: Payer: Self-pay | Admitting: Physical Therapy

## 2015-08-17 DIAGNOSIS — R262 Difficulty in walking, not elsewhere classified: Secondary | ICD-10-CM

## 2015-08-17 DIAGNOSIS — M25561 Pain in right knee: Secondary | ICD-10-CM | POA: Diagnosis not present

## 2015-08-17 DIAGNOSIS — M25661 Stiffness of right knee, not elsewhere classified: Secondary | ICD-10-CM

## 2015-08-17 DIAGNOSIS — R2241 Localized swelling, mass and lump, right lower limb: Secondary | ICD-10-CM

## 2015-08-17 NOTE — Therapy (Signed)
[place], [place], [address] Phone: [phone]   Fax:  [phone]  Physical Therapy Treatment  Patient Details  Name: Victoria Walker MRN: [id number] Date of Birth: 06/12/1950 Referring Provider: Noemi Chapel  Encounter Date: 08/17/2015      PT End of Session - 08/17/15 0927    Visit Number 11   PT Start Time 0840   PT Stop Time 0943   PT Time Calculation (min) 63 min   Activity Tolerance Patient tolerated treatment well   Behavior During Therapy [facility] for tasks assessed/performed      Past Medical History  Diagnosis Date  . Hypertension   . Neuropathy ([place])     FEET  . High cholesterol   . Hypothyroidism   . GERD (gastroesophageal reflux disease)   . Multinodular thyroid     PT HAVING TROUBLE SWALLOWING  . H/O hiatal hernia   . Claustrophobia   . H/O iritis     LEFT EYE - STATES HER EYE BECOMES RED AND VERY SENSITIVE TO LIGHT WHEN FLARE UP OF IRITIS  . History of chickenpox 09/26/2014  . History of measles   . History of shingles   . History of MRSA infection   . Medicare annual wellness visit, initial 04/07/2015  . Dysrhythmia     HX OF FAST HEART RATE AND PALPITATIONS - METOPROLOL HAS HELPED  . Wears glasses   . Spinal headache     spinal headache with epidural  . Heart murmur     "benign; 06/2015"  . Type II diabetes mellitus (HCC)     ORAL MEDICATION - NO INSULIN  . History of blood transfusion     "when I had my partial hysterectomy"  . Arthritis     "both knees" (07/02/2015)  . Primary localized osteoarthritis of right knee   . Cancer of right breast [facility])     Past Surgical History  Procedure Laterality Date  . Total thyroidectomy  1976    removed three tumor (2 on right; 1 on left)  . Plantar fascia surgery Left   . Abdominoplasty  1986  . Hernia repair    . Anterior and posterior repair  05/11/2012    Procedure: ANTERIOR (CYSTOCELE) AND POSTERIOR REPAIR (RECTOCELE);   Surgeon: Reece Packer, MD;  [facility];  Service: Urology;;  with graft  . Vaginal prolapse repair  05/11/2012    Procedure: VAGINAL VAULT SUSPENSION;  Surgeon: Reece Packer, MD;  [facility];  Service: Urology;;  . Cystoscopy with urethral dilatation  05/11/2012    Procedure: CYSTOSCOPY WITH URETHRAL DILATATION;  Surgeon: Reece Packer, MD;  [facility];  Service: Urology;;  . Thyroidectomy N/A 02/23/2014    Procedure: TOTAL THYROIDECTOMY;  Surgeon: Armandina Gemma, MD;  [facility];  Service: General;  Laterality: N/A;  . Vulva / perineum biopsy      vulvar  . Mastectomy Right 2007  . Colonoscopy    . Esophagogastroduodenoscopy    . Total knee arthroplasty Right 07/02/2015  . Umbilical hernia repair  1986  . Breast biopsy Right   . Abdominal hysterectomy  1988  . Tubal ligation    . Cardiac catheterization  1999  . Total knee arthroplasty Right 07/02/2015    Procedure: TOTAL KNEE ARTHROPLASTY;  Surgeon: Elsie Saas, MD;  Location: [place];  Service: Orthopedics;  Laterality: Right;    There were no vitals filed for this visit.  Subjective Assessment - 08/17/15 0843    Subjective "Im doing good, just stiff"   Currently in Pain? Yes   Pain Score 2    Pain Location Knee   Pain Orientation Right            [facility] PT Assessment - 08/17/15 0001    AROM   AROM Assessment Site Knee   Right/Left Knee Right   Right Knee Extension 6   Right Knee Flexion 104                     [facility] Adult PT Treatment/Exercise - 08/17/15 0001    Knee/Hip Exercises: Aerobic   Recumbent Bike L0 x5 min Seat 6   Full revolutions minor R hip hike   Nustep level 4 x 6 minutes   Knee/Hip Exercises: Machines for Strengthening   Cybex Knee Extension 10# 2x10; RLE only #5 x10   Cybex Knee Flexion 25# 2x15;  RLE only #10 x10    Cybex Leg Press 40# 2x15, RLE only no weight x10 stretch, RLE only #20 x10   Knee/Hip Exercises: Standing   Forward Step Up  Both;10 reps;Hand Hold: 0;Step Height: 6";1 set   Modalities   Modalities Vasopneumatic   Vasopneumatic   Number Minutes Vasopneumatic  15 minutes   Vasopnuematic Location  Knee   Vasopneumatic Pressure High   Vasopneumatic Temperature  34   Manual Therapy   Manual Therapy Passive ROM   Manual therapy comments Rt knee   Passive ROM Flexion and extension                  PT Short Term Goals - 07/26/15 1435    PT SHORT TERM GOAL #1   Title independent with initial HEP   Status Achieved           PT Long Term Goals - 08/13/15 1541    PT LONG TERM GOAL #3   Title walk witout assistive device   Status Achieved   PT LONG TERM GOAL #5   Title walk 600 feet without rest   Status Achieved               Plan - 08/17/15 0928    Clinical Impression Statement Pt has good strength with machine level interventions but has little carryover with functional interventions. Some hesitation with step ups. Pt does have little anterior pain in R knee when controlling descents.   Rehab Potential Good   PT Frequency 3x / week   PT Treatment/Interventions ADLs/Self Care Home Management;Cryotherapy;Electrical Stimulation;Therapeutic exercise;Therapeutic activities;Functional mobility training;Stair training;Gait training;Balance training;Neuromuscular re-education;Patient/family education;Manual techniques;Passive range of motion   PT Next Visit Plan Continue gait and PROM, Functional training.      Patient will benefit from skilled therapeutic intervention in order to improve the following deficits and impairments:  Abnormal gait, Decreased mobility, Decreased range of motion, Decreased scar mobility, Decreased strength, Increased edema, Difficulty walking, Increased muscle spasms, Impaired flexibility, Pain  Visit Diagnosis: Stiffness of right knee, not elsewhere classified  Localized swelling, mass and lump, right lower limb  Difficulty in walking, not elsewhere  classified  Pain in right knee     Problem List Patient Active Problem List   Diagnosis Date Noted  . Primary localized osteoarthritis of right knee   . History of MRSA infection   . History of measles   . Dysrhythmia   . Chronic rhinitis 06/06/2015  . Shortness of breath 06/06/2015  . Vaginitis and vulvovaginitis 04/25/2015  .  Lumbar back pain 04/25/2015  . Medicare annual wellness visit, initial 04/07/2015  . Allergic response 01/01/2015  . History of chickenpox 09/26/2014  . History of shingles   . Diabetes mellitus without complication ([place]) 123XX123  . 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 Deformity of metatarsal bone of left foot 03/03/2014  . Vulvar irritation  S/P neg vulvar bx 12/2013 01/23/2014  . 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 Right knee DJD 07/12/2013  . Neuralgia of left foot 06/24/2013  . Tarsal tunnel syndrome 04/08/2013  . Vaginal dryness, menopausal 04/05/2013  . Pronation deformity of ankle, acquired 02/09/2013  . Cortical cataract 02/01/2013  . H/O bone density study 01/26/2013  . Chest pain 01/05/2013  . History of iritis 01/04/2013  . Dry eye syndrome 10/07/2012  . Meibomian gland disease 10/07/2012  . GERD (gastroesophageal reflux disease) 10/05/2012  . Essential hypertension, benign 10/04/2012  . Hyperlipidemia, mixed 10/04/2012  . 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 Diabetic neuropathy ([place]) 10/04/2012  . S/P hysterectomy 10/04/2012  . Osteopenia 10/04/2012  . Hypothyroidism 10/04/2012  . History of cystocele 10/04/2012  . Dyspareunia in female 10/04/2012  . Plantar fasciitis 10/04/2012  . Nuclear sclerotic cataract 06/17/2012  . Diabetes mellitus ([place]) 03/02/2012  . BP (high blood pressure) 01/06/2012  . Iritis 01/06/2012  . Neuropathy ([place]) 01/06/2012  . Disease of thyroid gland 01/06/2012  . Breast cancer ([place]) 05/06/2011    Scot Jun, PTA  08/17/2015, 9:31  AM  [place], [place], [address] Phone: [phone]   Fax:  [phone]  Name: Aubryanna Mancino MRN: [id number] Date of Birth: 1950-06-12

## 2015-08-20 ENCOUNTER — Encounter: Payer: Self-pay | Admitting: Physical Therapy

## 2015-08-20 ENCOUNTER — Ambulatory Visit: Payer: Medicare HMO | Attending: Orthopedic Surgery | Admitting: Physical Therapy

## 2015-08-20 DIAGNOSIS — R262 Difficulty in walking, not elsewhere classified: Secondary | ICD-10-CM | POA: Diagnosis not present

## 2015-08-20 DIAGNOSIS — R2241 Localized swelling, mass and lump, right lower limb: Secondary | ICD-10-CM | POA: Insufficient documentation

## 2015-08-20 DIAGNOSIS — M25661 Stiffness of right knee, not elsewhere classified: Secondary | ICD-10-CM | POA: Diagnosis not present

## 2015-08-20 DIAGNOSIS — M25561 Pain in right knee: Secondary | ICD-10-CM | POA: Insufficient documentation

## 2015-08-20 NOTE — Therapy (Signed)
[place], [place], [address] Phone: [phone]   Fax:  [phone]  Physical Therapy Treatment  Patient Details  Name: Victoria Walker MRN: [id number] Date of Birth: 1950/06/17 Referring Provider: Noemi Chapel  Encounter Date: 08/20/2015      PT End of Session - 08/20/15 1514    Visit Number 12   Date for PT Re-Evaluation 09/22/15   PT Start Time 1430   PT Stop Time 1529   PT Time Calculation (min) 59 min   Activity Tolerance Patient tolerated treatment well   Behavior During Therapy [facility] for tasks assessed/performed      Past Medical History  Diagnosis Date  . Hypertension   . Neuropathy ([place])     FEET  . High cholesterol   . Hypothyroidism   . GERD (gastroesophageal reflux disease)   . Multinodular thyroid     PT HAVING TROUBLE SWALLOWING  . H/O hiatal hernia   . Claustrophobia   . H/O iritis     LEFT EYE - STATES HER EYE BECOMES RED AND VERY SENSITIVE TO LIGHT WHEN FLARE UP OF IRITIS  . History of chickenpox 09/26/2014  . History of measles   . History of shingles   . History of MRSA infection   . Medicare annual wellness visit, initial 04/07/2015  . Dysrhythmia     HX OF FAST HEART RATE AND PALPITATIONS - METOPROLOL HAS HELPED  . Wears glasses   . Spinal headache     spinal headache with epidural  . Heart murmur     "benign; 06/2015"  . Type II diabetes mellitus (HCC)     ORAL MEDICATION - NO INSULIN  . History of blood transfusion     "when I had my partial hysterectomy"  . Arthritis     "both knees" (07/02/2015)  . Primary localized osteoarthritis of right knee   . Cancer of right breast [facility])     Past Surgical History  Procedure Laterality Date  . Total thyroidectomy  1976    removed three tumor (2 on right; 1 on left)  . Plantar fascia surgery Left   . Abdominoplasty  1986  . Hernia repair    . Anterior and posterior repair  05/11/2012    Procedure: ANTERIOR  (CYSTOCELE) AND POSTERIOR REPAIR (RECTOCELE);  Surgeon: Reece Packer, MD;  [facility];  Service: Urology;;  with graft  . Vaginal prolapse repair  05/11/2012    Procedure: VAGINAL VAULT SUSPENSION;  Surgeon: Reece Packer, MD;  [facility];  Service: Urology;;  . Cystoscopy with urethral dilatation  05/11/2012    Procedure: CYSTOSCOPY WITH URETHRAL DILATATION;  Surgeon: Reece Packer, MD;  [facility];  Service: Urology;;  . Thyroidectomy N/A 02/23/2014    Procedure: TOTAL THYROIDECTOMY;  Surgeon: Armandina Gemma, MD;  [facility];  Service: General;  Laterality: N/A;  . Vulva / perineum biopsy      vulvar  . Mastectomy Right 2007  . Colonoscopy    . Esophagogastroduodenoscopy    . Total knee arthroplasty Right 07/02/2015  . Umbilical hernia repair  1986  . Breast biopsy Right   . Abdominal hysterectomy  1988  . Tubal ligation    . Cardiac catheterization  1999  . Total knee arthroplasty Right 07/02/2015    Procedure: TOTAL KNEE ARTHROPLASTY;  Surgeon: Elsie Saas, MD;  Location: [place];  Service: Orthopedics;  Laterality: Right;    There were no vitals filed  for this visit.      Subjective Assessment - 08/20/15 1428    Subjective "Lord have mercy, my knee been hurting all weekend"   Currently in Pain? Yes   Pain Score 2    Pain Location Knee                         [facility] Adult PT Treatment/Exercise - 08/20/15 0001    Ambulation/Gait   Stairs Yes   Stair Management Technique One rail Right;Alternating pattern;Step to pattern;Sideways   Number of Stairs 12   Height of Stairs 6   Gait Comments fearful to control descents with RLE   High Level Balance   High Level Balance Activities Negotiating over obstacles  Side stepping over objects   Knee/Hip Exercises: Aerobic   Recumbent Bike L0 x5 min Seat 6    Nustep level 4 x 6 minutes  R hip hike to make full revolutions.   Knee/Hip Exercises: Machines for Strengthening   Cybex Leg  Press 40# 2x15, RLE only no weight x10 stretch, RLE only #20 2x5   Knee/Hip Exercises: Standing   Gait Training Standing march #3 2x10-   Knee/Hip Exercises: Seated   Sit to Sand 2 sets;10 reps;without UE support   Modalities   Modalities Vasopneumatic   Vasopneumatic   Number Minutes Vasopneumatic  15 minutes   Vasopnuematic Location  Knee   Vasopneumatic Pressure High   Vasopneumatic Temperature  34                  PT Short Term Goals - 07/26/15 1435    PT SHORT TERM GOAL #1   Title independent with initial HEP   Status Achieved           PT Long Term Goals - 08/13/15 1541    PT LONG TERM GOAL #3   Title walk witout assistive device   Status Achieved   PT LONG TERM GOAL #5   Title walk 600 feet without rest   Status Achieved               Plan - 08/20/15 1515    Clinical Impression Statement Progressed to stair negotiation, pt fearful to control descent with RLE. Circumduction with RLE when negotiation over objects. Compensatory techniques uses with standing march. Cues provided throughout treatment for pt  to use RLE.   Rehab Potential Good   PT Frequency 3x / week   PT Duration 8 weeks   PT Treatment/Interventions ADLs/Self Care Home Management;Cryotherapy;Electrical Stimulation;Therapeutic exercise;Therapeutic activities;Functional mobility training;Stair training;Gait training;Balance training;Neuromuscular re-education;Patient/family education;Manual techniques;Passive range of motion   PT Next Visit Plan Continue gait and PROM, Functional training.      Patient will benefit from skilled therapeutic intervention in order to improve the following deficits and impairments:  Abnormal gait, Decreased mobility, Decreased range of motion, Decreased scar mobility, Decreased strength, Increased edema, Difficulty walking, Increased muscle spasms, Impaired flexibility, Pain  Visit Diagnosis: Stiffness of right knee, not elsewhere classified  Localized  swelling, mass and lump, right lower limb     Problem List Patient Active Problem List   Diagnosis Date Noted  . Primary localized osteoarthritis of right knee   . History of MRSA infection   . History of measles   . Dysrhythmia   . Chronic rhinitis 06/06/2015  . Shortness of breath 06/06/2015  . Vaginitis and vulvovaginitis 04/25/2015  . Lumbar back pain 04/25/2015  . Medicare annual wellness visit, initial 04/07/2015  .  Allergic response 01/01/2015  . History of chickenpox 09/26/2014  . History of shingles   . Diabetes mellitus without complication ([place]) 123XX123  . 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 Deformity of metatarsal bone of left foot 03/03/2014  . Vulvar irritation  S/P neg vulvar bx 12/2013 01/23/2014  . 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 Right knee DJD 07/12/2013  . Neuralgia of left foot 06/24/2013  . Tarsal tunnel syndrome 04/08/2013  . Vaginal dryness, menopausal 04/05/2013  . Pronation deformity of ankle, acquired 02/09/2013  . Cortical cataract 02/01/2013  . H/O bone density study 01/26/2013  . Chest pain 01/05/2013  . History of iritis 01/04/2013  . Dry eye syndrome 10/07/2012  . Meibomian gland disease 10/07/2012  . GERD (gastroesophageal reflux disease) 10/05/2012  . Essential hypertension, benign 10/04/2012  . Hyperlipidemia, mixed 10/04/2012  . 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 Diabetic neuropathy ([place]) 10/04/2012  . S/P hysterectomy 10/04/2012  . Osteopenia 10/04/2012  . Hypothyroidism 10/04/2012  . History of cystocele 10/04/2012  . Dyspareunia in female 10/04/2012  . Plantar fasciitis 10/04/2012  . Nuclear sclerotic cataract 06/17/2012  . Diabetes mellitus ([place]) 03/02/2012  . BP (high blood pressure) 01/06/2012  . Iritis 01/06/2012  . Neuropathy ([place]) 01/06/2012  . Disease of thyroid gland 01/06/2012  . Breast cancer ([place]) 05/06/2011    Scot Jun, PTA  08/20/2015,  3:17 PM  [place], [place], [address] Phone: [phone]   Fax:  [phone]  Name: Victoria Walker MRN: [id number] Date of Birth: [birth year]

## 2015-08-22 ENCOUNTER — Encounter: Payer: Self-pay | Admitting: Physical Therapy

## 2015-08-22 ENCOUNTER — Ambulatory Visit: Payer: Medicare HMO | Admitting: Physical Therapy

## 2015-08-22 ENCOUNTER — Other Ambulatory Visit: Payer: Self-pay | Admitting: Family Medicine

## 2015-08-22 DIAGNOSIS — R262 Difficulty in walking, not elsewhere classified: Secondary | ICD-10-CM

## 2015-08-22 DIAGNOSIS — M25661 Stiffness of right knee, not elsewhere classified: Secondary | ICD-10-CM | POA: Diagnosis not present

## 2015-08-22 DIAGNOSIS — R2241 Localized swelling, mass and lump, right lower limb: Secondary | ICD-10-CM

## 2015-08-22 DIAGNOSIS — M25561 Pain in right knee: Secondary | ICD-10-CM

## 2015-08-22 NOTE — Therapy (Signed)
[place], [place], [address] Phone: [phone]   Fax:  [phone]  Physical Therapy Treatment  Patient Details  Name: Victoria Walker MRN: [id number] Date of Birth: 1950-08-17 Referring Provider: Noemi Chapel  Encounter Date: 08/22/2015      PT End of Session - 08/22/15 1355    Visit Number 13   Date for PT Re-Evaluation 09/22/15   PT Start Time 1300   PT Stop Time 1405   PT Time Calculation (min) 65 min   Activity Tolerance Patient tolerated treatment well   Behavior During Therapy [facility] for tasks assessed/performed      Past Medical History  Diagnosis Date  . Hypertension   . Neuropathy ([place])     FEET  . High cholesterol   . Hypothyroidism   . GERD (gastroesophageal reflux disease)   . Multinodular thyroid     PT HAVING TROUBLE SWALLOWING  . H/O hiatal hernia   . Claustrophobia   . H/O iritis     LEFT EYE - STATES HER EYE BECOMES RED AND VERY SENSITIVE TO LIGHT WHEN FLARE UP OF IRITIS  . History of chickenpox 09/26/2014  . History of measles   . History of shingles   . History of MRSA infection   . Medicare annual wellness visit, initial 04/07/2015  . Dysrhythmia     HX OF FAST HEART RATE AND PALPITATIONS - METOPROLOL HAS HELPED  . Wears glasses   . Spinal headache     spinal headache with epidural  . Heart murmur     "benign; 06/2015"  . Type II diabetes mellitus (HCC)     ORAL MEDICATION - NO INSULIN  . History of blood transfusion     "when I had my partial hysterectomy"  . Arthritis     "both knees" (07/02/2015)  . Primary localized osteoarthritis of right knee   . Cancer of right breast [facility])     Past Surgical History  Procedure Laterality Date  . Total thyroidectomy  1976    removed three tumor (2 on right; 1 on left)  . Plantar fascia surgery Left   . Abdominoplasty  1986  . Hernia repair    . Anterior and posterior repair  05/11/2012    Procedure: ANTERIOR  (CYSTOCELE) AND POSTERIOR REPAIR (RECTOCELE);  Surgeon: Reece Packer, MD;  [facility];  Service: Urology;;  with graft  . Vaginal prolapse repair  05/11/2012    Procedure: VAGINAL VAULT SUSPENSION;  Surgeon: Reece Packer, MD;  [facility];  Service: Urology;;  . Cystoscopy with urethral dilatation  05/11/2012    Procedure: CYSTOSCOPY WITH URETHRAL DILATATION;  Surgeon: Reece Packer, MD;  [facility];  Service: Urology;;  . Thyroidectomy N/A 02/23/2014    Procedure: TOTAL THYROIDECTOMY;  Surgeon: Armandina Gemma, MD;  [facility];  Service: General;  Laterality: N/A;  . Vulva / perineum biopsy      vulvar  . Mastectomy Right 2007  . Colonoscopy    . Esophagogastroduodenoscopy    . Total knee arthroplasty Right 07/02/2015  . Umbilical hernia repair  1986  . Breast biopsy Right   . Abdominal hysterectomy  1988  . Tubal ligation    . Cardiac catheterization  1999  . Total knee arthroplasty Right 07/02/2015    Procedure: TOTAL KNEE ARTHROPLASTY;  Surgeon: Elsie Saas, MD;  Location: [place];  Service: Orthopedics;  Laterality: Right;    There were no vitals filed  for this visit.      Subjective Assessment - 08/22/15 1304    Subjective "Im fine today" [facility]"   Currently in Pain? Yes   Pain Score 3    Pain Location Knee   Pain Orientation Right            [facility] PT Assessment - 08/22/15 0001    AROM   AROM Assessment Site Knee   Right/Left Knee Right   Right Knee Extension 6   Right Knee Flexion 110   PROM   PROM Assessment Site Knee   Right/Left Knee Right   Right Knee Extension 0   Right Knee Flexion 117                     [facility] Adult PT Treatment/Exercise - 08/22/15 0001    Knee/Hip Exercises: Aerobic   Recumbent Bike L0 x5 min Seat 5  inicial hip hike to make full revolutions   Nustep level 4 x 6 minutes   Knee/Hip Exercises: Machines for Strengthening   Cybex Leg Press 40# 2x15, RLE only no weight  x10 stretch, RLE only #20 2x5   Knee/Hip Exercises: Standing   Other Standing Knee Exercises Heel raises black bar x20   Knee/Hip Exercises: Seated   Sit to Sand 2 sets;10 reps;without UE support   Modalities   Modalities Vasopneumatic   Vasopneumatic   Number Minutes Vasopneumatic  15 minutes   Vasopnuematic Location  Knee   Vasopneumatic Pressure High   Vasopneumatic Temperature  34   Manual Therapy   Manual Therapy Passive ROM   Manual therapy comments Rt knee   Passive ROM Flexion and extension                  PT Short Term Goals - 07/26/15 1435    PT SHORT TERM GOAL #1   Title independent with initial HEP   Status Achieved           PT Long Term Goals - 08/22/15 1359    PT LONG TERM GOAL #2   Title increase AROM of the right knee to 4-115 degrees flexion   Status Partially Met               Plan - 08/22/15 1355    Clinical Impression Statement Pt shows good strength with machine interventions but continues to be functionally weak. Pt remains hesitant with functional interventions that could contribute to he LE appearing weak. Compensatory techniques used when moving around clinic from machine to machine. Cues provided to correct.   Rehab Potential Good   PT Frequency 3x / week   PT Duration 8 weeks   PT Treatment/Interventions ADLs/Self Care Home Management;Cryotherapy;Electrical Stimulation;Therapeutic exercise;Therapeutic activities;Functional mobility training;Stair training;Gait training;Balance training;Neuromuscular re-education;Patient/family education;Manual techniques;Passive range of motion   PT Next Visit Plan Continue gait and PROM, Functional training.      Patient will benefit from skilled therapeutic intervention in order to improve the following deficits and impairments:  Abnormal gait, Decreased mobility, Decreased range of motion, Decreased scar mobility, Decreased strength, Increased edema, Difficulty walking, Increased muscle  spasms, Impaired flexibility, Pain  Visit Diagnosis: Stiffness of right knee, not elsewhere classified  Localized swelling, mass and lump, right lower limb  Difficulty in walking, not elsewhere classified  Pain in right knee     Problem List Patient Active Problem List   Diagnosis Date Noted  . Primary localized osteoarthritis of right knee   . History of  MRSA infection   . History of measles   . Dysrhythmia   . Chronic rhinitis 06/06/2015  . Shortness of breath 06/06/2015  . Vaginitis and vulvovaginitis 04/25/2015  . Lumbar back pain 04/25/2015  . Medicare annual wellness visit, initial 04/07/2015  . Allergic response 01/01/2015  . History of chickenpox 09/26/2014  . History of shingles   . Diabetes mellitus without complication ([place]) 68/11/8108  . 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 Deformity of metatarsal bone of left foot 03/03/2014  . Vulvar irritation  S/P neg vulvar bx 12/2013 01/23/2014  . 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 Right knee DJD 07/12/2013  . Neuralgia of left foot 06/24/2013  . Tarsal tunnel syndrome 04/08/2013  . Vaginal dryness, menopausal 04/05/2013  . Pronation deformity of ankle, acquired 02/09/2013  . Cortical cataract 02/01/2013  . H/O bone density study 01/26/2013  . Chest pain 01/05/2013  . History of iritis 01/04/2013  . Dry eye syndrome 10/07/2012  . Meibomian gland disease 10/07/2012  . GERD (gastroesophageal reflux disease) 10/05/2012  . Essential hypertension, benign 10/04/2012  . Hyperlipidemia, mixed 10/04/2012  . 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 Diabetic neuropathy ([place]) 10/04/2012  . S/P hysterectomy 10/04/2012  . Osteopenia 10/04/2012  . Hypothyroidism 10/04/2012  . History of cystocele 10/04/2012  . Dyspareunia in female 10/04/2012  . Plantar fasciitis 10/04/2012  . Nuclear sclerotic cataract 06/17/2012  . Diabetes mellitus ([place]) 03/02/2012  . BP (high blood  pressure) 01/06/2012  . Iritis 01/06/2012  . Neuropathy ([place]) 01/06/2012  . Disease of thyroid gland 01/06/2012  . Breast cancer ([place]) 05/06/2011    Scot Jun, PTA  08/22/2015, 2:01 PM  [place], [place], [address] Phone: [phone]   Fax:  [phone]  Name: Deseri Loss MRN: [id number] Date of Birth: [birth year]

## 2015-08-23 ENCOUNTER — Ambulatory Visit: Payer: Medicare HMO | Admitting: Physical Therapy

## 2015-08-23 ENCOUNTER — Encounter: Payer: Self-pay | Admitting: Physical Therapy

## 2015-08-23 DIAGNOSIS — R2241 Localized swelling, mass and lump, right lower limb: Secondary | ICD-10-CM

## 2015-08-23 DIAGNOSIS — M25661 Stiffness of right knee, not elsewhere classified: Secondary | ICD-10-CM | POA: Diagnosis not present

## 2015-08-23 DIAGNOSIS — M25561 Pain in right knee: Secondary | ICD-10-CM

## 2015-08-23 NOTE — Therapy (Signed)
[facility] [address] [place], [place], [address] Phone: [phone]   Fax:  [phone]  Physical Therapy Treatment  Patient Details  Name: Victoria Walker MRN: [id number] Date of Birth: 07/26/1950 Referring Provider: Wainer  Encounter Date: 08/23/2015      PT End of Session - 08/23/15 1651    Visit Number 14   PT Start Time 1555   PT Stop Time 1705   PT Time Calculation (min) 70 min   Activity Tolerance Patient tolerated treatment well   Behavior During Therapy WFL for tasks assessed/performed      Past Medical History  Diagnosis Date  . Hypertension   . Neuropathy (HCC)     FEET  . High cholesterol   . Hypothyroidism   . GERD (gastroesophageal reflux disease)   . Multinodular thyroid     PT HAVING TROUBLE SWALLOWING  . H/O hiatal hernia   . Claustrophobia   . H/O iritis     LEFT EYE - STATES HER EYE BECOMES RED AND VERY SENSITIVE TO LIGHT WHEN FLARE UP OF IRITIS  . History of chickenpox 09/26/2014  . History of measles   . History of shingles   . History of MRSA infection   . Medicare annual wellness visit, initial 04/07/2015  . Dysrhythmia     HX OF FAST HEART RATE AND PALPITATIONS - METOPROLOL HAS HELPED  . Wears glasses   . Spinal headache     spinal headache with epidural  . Heart murmur     "benign; 06/2015"  . Type II diabetes mellitus (HCC)     ORAL MEDICATION - NO INSULIN  . History of blood transfusion     "when I had my partial hysterectomy"  . Arthritis     "both knees" (07/02/2015)  . Primary localized osteoarthritis of right knee   . Cancer of right breast (HCC)     Past Surgical History  Procedure Laterality Date  . Total thyroidectomy  1976    removed three tumor (2 on right; 1 on left)  . Plantar fascia surgery Left   . Abdominoplasty  1986  . Hernia repair    . Anterior and posterior repair  05/11/2012    Procedure: ANTERIOR (CYSTOCELE) AND POSTERIOR REPAIR (RECTOCELE);   Surgeon: Scott A MacDiarmid, MD;  Location: [facility];  Service: Urology;;  with graft  . Vaginal prolapse repair  05/11/2012    Procedure: VAGINAL VAULT SUSPENSION;  Surgeon: Scott A MacDiarmid, MD;  [facility];  Service: Urology;;  . Cystoscopy with urethral dilatation  05/11/2012    Procedure: CYSTOSCOPY WITH URETHRAL DILATATION;  Surgeon: Scott A MacDiarmid, MD;  [facility];  Service: Urology;;  . Thyroidectomy N/A 02/23/2014    Procedure: TOTAL THYROIDECTOMY;  Surgeon: Todd Gerkin, MD;  [facility];  Service: General;  Laterality: N/A;  . Vulva / perineum biopsy      vulvar  . Mastectomy Right 2007  . Colonoscopy    . Esophagogastroduodenoscopy    . Total knee arthroplasty Right 07/02/2015  . Umbilical hernia repair  1986  . Breast biopsy Right   . Abdominal hysterectomy  1988  . Tubal ligation    . Cardiac catheterization  1999  . Total knee arthroplasty Right 07/02/2015    Procedure: TOTAL KNEE ARTHROPLASTY;  Surgeon: Robert Wainer, MD;  [facility];  Service: Orthopedics;  Laterality: Right;    There were no vitals filed for this visit.        Subjective Assessment - 08/23/15 1555    Subjective "This thing tight today"   Currently in Pain? Yes   Pain Score 4    Pain Location Knee   Pain Orientation Right   Pain Descriptors / Indicators Sore                         [facility] Adult PT Treatment/Exercise - 08/23/15 0001    Knee/Hip Exercises: Aerobic   Recumbent Bike L0 x5 min Seat 5   Nustep level 4 x 6 minutes   Knee/Hip Exercises: Machines for Strengthening   Cybex Knee Extension 10# 2x10; RLE only #5 x10   Cybex Knee Flexion 35# 2x10;  RLE only #10 x10    Cybex Leg Press RLE only no weight x5 stretch, RLE only #20 2x5   Knee/Hip Exercises: Standing   Lateral Step Up 1 set;5 reps;Right;Hand Hold: 0;Step Height: 6"   Forward Step Up Both;10 reps;Hand Hold: 0;Step Height: 6";1 set   Knee/Hip Exercises: Seated   Sit to Sand 10 reps;without  UE support;2 sets   Modalities   Modalities Vasopneumatic   Vasopneumatic   Number Minutes Vasopneumatic  15 minutes   Vasopnuematic Location  Knee   Vasopneumatic Pressure High   Vasopneumatic Temperature  34   Manual Therapy   Manual Therapy Passive ROM   Passive ROM Flexion and extension                  PT Short Term Goals - 07/26/15 1435    PT SHORT TERM GOAL #1   Title independent with initial HEP   Status Achieved           PT Long Term Goals - 08/22/15 1359    PT LONG TERM GOAL #2   Title increase AROM of the right knee to 4-115 degrees flexion   Status Partially Met               Plan - 08/23/15 1653    Clinical Impression Statement Continues to have good strength with machines but remains functionally weak. weakness observed with step ups when controlling descent with RLE. Pt quickly brings her LLE up when controlling the ascends. Does show better balance with sit to stands.   Rehab Potential Good   PT Frequency 3x / week   PT Duration 8 weeks   PT Treatment/Interventions ADLs/Self Care Home Management;Cryotherapy;Electrical Stimulation;Therapeutic exercise;Therapeutic activities;Functional mobility training;Stair training;Gait training;Balance training;Neuromuscular re-education;Patient/family education;Manual techniques;Passive range of motion   PT Next Visit Plan Continue gait and PROM, Functional training.      Patient will benefit from skilled therapeutic intervention in order to improve the following deficits and impairments:  Abnormal gait, Decreased mobility, Decreased range of motion, Decreased scar mobility, Decreased strength, Increased edema, Difficulty walking, Increased muscle spasms, Impaired flexibility, Pain  Visit Diagnosis: Stiffness of right knee, not elsewhere classified  Localized swelling, mass and lump, right lower limb  Pain in right knee     Problem List Patient Active Problem List   Diagnosis Date Noted  .  Primary localized osteoarthritis of right knee   . History of MRSA infection   . History of measles   . Dysrhythmia   . Chronic rhinitis 06/06/2015  . Shortness of breath 06/06/2015  . Vaginitis and vulvovaginitis 04/25/2015  . Lumbar back pain 04/25/2015  . Medicare annual wellness visit, initial 04/07/2015  . Allergic response 01/01/2015  . History of chickenpox 09/26/2014  . History of shingles   .  Diabetes mellitus without complication ([place]) 23/53/6144  . 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 Deformity of metatarsal bone of left foot 03/03/2014  . Vulvar irritation  S/P neg vulvar bx 12/2013 01/23/2014  . 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 Right knee DJD 07/12/2013  . Neuralgia of left foot 06/24/2013  . Tarsal tunnel syndrome 04/08/2013  . Vaginal dryness, menopausal 04/05/2013  . Pronation deformity of ankle, acquired 02/09/2013  . Cortical cataract 02/01/2013  . H/O bone density study 01/26/2013  . Chest pain 01/05/2013  . History of iritis 01/04/2013  . Dry eye syndrome 10/07/2012  . Meibomian gland disease 10/07/2012  . GERD (gastroesophageal reflux disease) 10/05/2012  . Essential hypertension, benign 10/04/2012  . Hyperlipidemia, mixed 10/04/2012  . 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 Diabetic neuropathy ([place]) 10/04/2012  . S/P hysterectomy 10/04/2012  . Osteopenia 10/04/2012  . Hypothyroidism 10/04/2012  . History of cystocele 10/04/2012  . Dyspareunia in female 10/04/2012  . Plantar fasciitis 10/04/2012  . Nuclear sclerotic cataract 06/17/2012  . Diabetes mellitus ([place]) 03/02/2012  . BP (high blood pressure) 01/06/2012  . Iritis 01/06/2012  . Neuropathy ([place]) 01/06/2012  . Disease of thyroid gland 01/06/2012  . Breast cancer ([place]) 05/06/2011    Scot Jun, PTA  08/23/2015, 5:03 PM  [place], [place],  [address] Phone: [phone]   Fax:  [phone]  Name: Victoria Walker MRN: [id number] Date of Birth: 09/13/1950

## 2015-08-27 ENCOUNTER — Ambulatory Visit: Payer: Medicare HMO | Admitting: Physical Therapy

## 2015-08-27 ENCOUNTER — Encounter: Payer: Self-pay | Admitting: Physical Therapy

## 2015-08-27 DIAGNOSIS — M25661 Stiffness of right knee, not elsewhere classified: Secondary | ICD-10-CM | POA: Diagnosis not present

## 2015-08-27 DIAGNOSIS — M25561 Pain in right knee: Secondary | ICD-10-CM

## 2015-08-27 DIAGNOSIS — R262 Difficulty in walking, not elsewhere classified: Secondary | ICD-10-CM

## 2015-08-27 DIAGNOSIS — R2241 Localized swelling, mass and lump, right lower limb: Secondary | ICD-10-CM

## 2015-08-27 NOTE — Therapy (Signed)
[place], [place], [address] Phone: [phone]   Fax:  [phone]  Physical Therapy Treatment  Patient Details  Name: Victoria Walker MRN: [id number] Date of Birth: 01-13-1951 Referring Provider: Noemi Chapel  Encounter Date: 08/27/2015      PT End of Session - 08/27/15 1427    Visit Number 15   Date for PT Re-Evaluation 09/22/15   PT Start Time 1342   PT Stop Time 1441   PT Time Calculation (min) 59 min   Activity Tolerance Patient tolerated treatment well   Behavior During Therapy [facility] for tasks assessed/performed      Past Medical History  Diagnosis Date  . Hypertension   . Neuropathy ([place])     FEET  . High cholesterol   . Hypothyroidism   . GERD (gastroesophageal reflux disease)   . Multinodular thyroid     PT HAVING TROUBLE SWALLOWING  . H/O hiatal hernia   . Claustrophobia   . H/O iritis     LEFT EYE - STATES HER EYE BECOMES RED AND VERY SENSITIVE TO LIGHT WHEN FLARE UP OF IRITIS  . History of chickenpox 09/26/2014  . History of measles   . History of shingles   . History of MRSA infection   . Medicare annual wellness visit, initial 04/07/2015  . Dysrhythmia     HX OF FAST HEART RATE AND PALPITATIONS - METOPROLOL HAS HELPED  . Wears glasses   . Spinal headache     spinal headache with epidural  . Heart murmur     "benign; 06/2015"  . Type II diabetes mellitus (HCC)     ORAL MEDICATION - NO INSULIN  . History of blood transfusion     "when I had my partial hysterectomy"  . Arthritis     "both knees" (07/02/2015)  . Primary localized osteoarthritis of right knee   . Cancer of right breast [facility])     Past Surgical History  Procedure Laterality Date  . Total thyroidectomy  1976    removed three tumor (2 on right; 1 on left)  . Plantar fascia surgery Left   . Abdominoplasty  1986  . Hernia repair    . Anterior and posterior repair  05/11/2012    Procedure: ANTERIOR  (CYSTOCELE) AND POSTERIOR REPAIR (RECTOCELE);  Surgeon: Reece Packer, MD;  [facility];  Service: Urology;;  with graft  . Vaginal prolapse repair  05/11/2012    Procedure: VAGINAL VAULT SUSPENSION;  Surgeon: Reece Packer, MD;  [facility];  Service: Urology;;  . Cystoscopy with urethral dilatation  05/11/2012    Procedure: CYSTOSCOPY WITH URETHRAL DILATATION;  Surgeon: Reece Packer, MD;  [facility];  Service: Urology;;  . Thyroidectomy N/A 02/23/2014    Procedure: TOTAL THYROIDECTOMY;  Surgeon: Armandina Gemma, MD;  [facility];  Service: General;  Laterality: N/A;  . Vulva / perineum biopsy      vulvar  . Mastectomy Right 2007  . Colonoscopy    . Esophagogastroduodenoscopy    . Total knee arthroplasty Right 07/02/2015  . Umbilical hernia repair  1986  . Breast biopsy Right   . Abdominal hysterectomy  1988  . Tubal ligation    . Cardiac catheterization  1999  . Total knee arthroplasty Right 07/02/2015    Procedure: TOTAL KNEE ARTHROPLASTY;  Surgeon: Elsie Saas, MD;  Location: [place];  Service: Orthopedics;  Laterality: Right;    There were no vitals filed  for this visit.      Subjective Assessment - 08/27/15 1347    Subjective "I did all my exercises, and put ice on it, It  stiffen up on me"   Currently in Pain? Yes   Pain Score 3    Pain Location Knee   Pain Orientation Right   Pain Descriptors / Indicators --  stiff                         [facility] Adult PT Treatment/Exercise - 08/27/15 0001    Ambulation/Gait   Ambulation/Gait Yes   Ambulation/Gait Assistance 6: Modified independent (Device/Increase time);5: Supervision   Ambulation Distance (Feet) 600 Feet   Assistive device None   Gait Pattern Step-through pattern;Lateral trunk lean to left   Ambulation Surface Unlevel;Outdoor   Gait velocity slow   Gait Comments Gait arount buolding up and down slopes. Reports some anterior R kne pain going down hill.   Knee/Hip  Exercises: Aerobic   Recumbent Bike L0 x5 min Seat 5   Nustep level 4 x 6 minutes   Knee/Hip Exercises: Machines for Strengthening   Cybex Leg Press #40 2x10; RLE only no weight x5 stretch  Blue  Tband under RLE to strenght TKE   Knee/Hip Exercises: Standing   Lateral Step Up 1 set;Right;Hand Hold: 0;Step Height: 6";10 reps   Forward Step Up Both;10 reps;Hand Hold: 0;Step Height: 6";1 set   Knee/Hip Exercises: Seated   Sit to Sand 10 reps;without UE support;2 sets  yellow ball    Modalities   Modalities Vasopneumatic   Vasopneumatic   Number Minutes Vasopneumatic  15 minutes   Vasopnuematic Location  Knee   Vasopneumatic Pressure High   Vasopneumatic Temperature  34                  PT Short Term Goals - 07/26/15 1435    PT SHORT TERM GOAL #1   Title independent with initial HEP   Status Achieved           PT Long Term Goals - 08/22/15 1359    PT LONG TERM GOAL #2   Title increase AROM of the right knee to 4-115 degrees flexion   Status Partially Met               Plan - 08/27/15 1428    Clinical Impression Statement Pt with a progression to outdoor ambulation on uneven surfaces. Reports  anterior R knee pain when walking down hill a potential indication of weak quad with eccentric loads. Remains hesitant with step up interventions. Able to reach TKE with close  chain intervention such as leg press.   Rehab Potential Good   PT Frequency 3x / week   PT Duration 8 weeks   PT Treatment/Interventions ADLs/Self Care Home Management;Cryotherapy;Electrical Stimulation;Therapeutic exercise;Therapeutic activities;Functional mobility training;Stair training;Gait training;Balance training;Neuromuscular re-education;Patient/family education;Manual techniques;Passive range of motion   PT Next Visit Plan Continue gait and PROM, Functional training.      Patient will benefit from skilled therapeutic intervention in order to improve the following deficits and  impairments:  Abnormal gait, Decreased mobility, Decreased range of motion, Decreased scar mobility, Decreased strength, Increased edema, Difficulty walking, Increased muscle spasms, Impaired flexibility, Pain  Visit Diagnosis: Stiffness of right knee, not elsewhere classified  Localized swelling, mass and lump, right lower limb  Pain in right knee  Difficulty in walking, not elsewhere classified     Problem List Patient Active Problem List   Diagnosis  Date Noted  . Primary localized osteoarthritis of right knee   . History of MRSA infection   . History of measles   . Dysrhythmia   . Chronic rhinitis 06/06/2015  . Shortness of breath 06/06/2015  . Vaginitis and vulvovaginitis 04/25/2015  . Lumbar back pain 04/25/2015  . Medicare annual wellness visit, initial 04/07/2015  . Allergic response 01/01/2015  . History of chickenpox 09/26/2014  . History of shingles   . Diabetes mellitus without complication ([place]) 41/96/2229  . 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 Deformity of metatarsal bone of left foot 03/03/2014  . Vulvar irritation  S/P neg vulvar bx 12/2013 01/23/2014  . 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 Right knee DJD 07/12/2013  . Neuralgia of left foot 06/24/2013  . Tarsal tunnel syndrome 04/08/2013  . Vaginal dryness, menopausal 04/05/2013  . Pronation deformity of ankle, acquired 02/09/2013  . Cortical cataract 02/01/2013  . H/O bone density study 01/26/2013  . Chest pain 01/05/2013  . History of iritis 01/04/2013  . Dry eye syndrome 10/07/2012  . Meibomian gland disease 10/07/2012  . GERD (gastroesophageal reflux disease) 10/05/2012  . Essential hypertension, benign 10/04/2012  . Hyperlipidemia, mixed 10/04/2012  . 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 Diabetic neuropathy ([place]) 10/04/2012  . S/P hysterectomy 10/04/2012  . Osteopenia 10/04/2012  . Hypothyroidism 10/04/2012  . History of cystocele  10/04/2012  . Dyspareunia in female 10/04/2012  . Plantar fasciitis 10/04/2012  . Nuclear sclerotic cataract 06/17/2012  . Diabetes mellitus ([place]) 03/02/2012  . BP (high blood pressure) 01/06/2012  . Iritis 01/06/2012  . Neuropathy ([place]) 01/06/2012  . Disease of thyroid gland 01/06/2012  . Breast cancer ([place]) 05/06/2011    Scot Jun, PTA  08/27/2015, 2:30 PM  [place], [place], [address] Phone: [phone]   Fax:  [phone]  Name: Armina Galloway MRN: [id number] Date of Birth: 06/04/1950

## 2015-08-29 ENCOUNTER — Ambulatory Visit: Payer: Medicare HMO | Admitting: Physical Therapy

## 2015-08-29 ENCOUNTER — Encounter: Payer: Self-pay | Admitting: Physical Therapy

## 2015-08-29 DIAGNOSIS — R2241 Localized swelling, mass and lump, right lower limb: Secondary | ICD-10-CM

## 2015-08-29 DIAGNOSIS — M25661 Stiffness of right knee, not elsewhere classified: Secondary | ICD-10-CM | POA: Diagnosis not present

## 2015-08-29 DIAGNOSIS — R262 Difficulty in walking, not elsewhere classified: Secondary | ICD-10-CM

## 2015-08-29 DIAGNOSIS — M25561 Pain in right knee: Secondary | ICD-10-CM

## 2015-08-29 NOTE — Therapy (Signed)
[place], [place], [address] Phone: [phone]   Fax:  [phone]  Physical Therapy Treatment  Patient Details  Name: Victoria Walker MRN: [id number] Date of Birth: Feb 17, 1951 Referring Provider: Noemi Chapel  Encounter Date: 08/29/2015      PT End of Session - 08/29/15 1426    Visit Number 16   Date for PT Re-Evaluation 09/22/15   PT Start Time 7371   PT Stop Time 0626   PT Time Calculation (min) 62 min      Past Medical History  Diagnosis Date  . Hypertension   . Neuropathy ([place])     FEET  . High cholesterol   . Hypothyroidism   . GERD (gastroesophageal reflux disease)   . Multinodular thyroid     PT HAVING TROUBLE SWALLOWING  . H/O hiatal hernia   . Claustrophobia   . H/O iritis     LEFT EYE - STATES HER EYE BECOMES RED AND VERY SENSITIVE TO LIGHT WHEN FLARE UP OF IRITIS  . History of chickenpox 09/26/2014  . History of measles   . History of shingles   . History of MRSA infection   . Medicare annual wellness visit, initial 04/07/2015  . Dysrhythmia     HX OF FAST HEART RATE AND PALPITATIONS - METOPROLOL HAS HELPED  . Wears glasses   . Spinal headache     spinal headache with epidural  . Heart murmur     "benign; 06/2015"  . Type II diabetes mellitus (HCC)     ORAL MEDICATION - NO INSULIN  . History of blood transfusion     "when I had my partial hysterectomy"  . Arthritis     "both knees" (07/02/2015)  . Primary localized osteoarthritis of right knee   . Cancer of right breast [facility])     Past Surgical History  Procedure Laterality Date  . Total thyroidectomy  1976    removed three tumor (2 on right; 1 on left)  . Plantar fascia surgery Left   . Abdominoplasty  1986  . Hernia repair    . Anterior and posterior repair  05/11/2012    Procedure: ANTERIOR (CYSTOCELE) AND POSTERIOR REPAIR (RECTOCELE);  Surgeon: Reece Packer, MD;  [facility];  Service: Urology;;   with graft  . Vaginal prolapse repair  05/11/2012    Procedure: VAGINAL VAULT SUSPENSION;  Surgeon: Reece Packer, MD;  [facility];  Service: Urology;;  . Cystoscopy with urethral dilatation  05/11/2012    Procedure: CYSTOSCOPY WITH URETHRAL DILATATION;  Surgeon: Reece Packer, MD;  [facility];  Service: Urology;;  . Thyroidectomy N/A 02/23/2014    Procedure: TOTAL THYROIDECTOMY;  Surgeon: Armandina Gemma, MD;  [facility];  Service: General;  Laterality: N/A;  . Vulva / perineum biopsy      vulvar  . Mastectomy Right 2007  . Colonoscopy    . Esophagogastroduodenoscopy    . Total knee arthroplasty Right 07/02/2015  . Umbilical hernia repair  1986  . Breast biopsy Right   . Abdominal hysterectomy  1988  . Tubal ligation    . Cardiac catheterization  1999  . Total knee arthroplasty Right 07/02/2015    Procedure: TOTAL KNEE ARTHROPLASTY;  Surgeon: Elsie Saas, MD;  Location: [place];  Service: Orthopedics;  Laterality: Right;    There were no vitals filed for this visit.      Subjective Assessment - 08/29/15 1355    Subjective "  I been good"   Currently in Pain? No/denies   Pain Score 0-No pain                         [facility] Adult PT Treatment/Exercise - 08/29/15 0001    Knee/Hip Exercises: Aerobic   Elliptical I5 R5 x4 min    Recumbent Bike L0 x2 min Seat 5   Nustep level 4 x 6 minutes   Knee/Hip Exercises: Machines for Strengthening   Cybex Knee Extension 10# 2x10; RLE only #5 x10   Cybex Knee Flexion 35# 2x10;  RLE only #10 x10    Knee/Hip Exercises: Standing   Lateral Step Up 1 set;Right;Hand Hold: 0;Step Height: 6";10 reps   Forward Step Up Both;10 reps;Hand Hold: 0;1 set;Step Height: 8"   Modalities   Modalities Vasopneumatic   Vasopneumatic   Number Minutes Vasopneumatic  15 minutes   Vasopnuematic Location  Knee   Vasopneumatic Pressure High   Vasopneumatic Temperature  34                  PT Short Term Goals -  07/26/15 1435    PT SHORT TERM GOAL #1   Title independent with initial HEP   Status Achieved           PT Long Term Goals - 08/22/15 1359    PT LONG TERM GOAL #2   Title increase AROM of the right knee to 4-115 degrees flexion   Status Partially Met               Plan - 08/29/15 1426    Clinical Impression Statement Good strenght and ROM on machine interventions, but remain functionally weak. Her weak function could be due to her being hesitant with the interventions such as step ups.   Rehab Potential Good   PT Frequency 3x / week   PT Duration 8 weeks   PT Treatment/Interventions ADLs/Self Care Home Management;Cryotherapy;Electrical Stimulation;Therapeutic exercise;Therapeutic activities;Functional mobility training;Stair training;Gait training;Balance training;Neuromuscular re-education;Patient/family education;Manual techniques;Passive range of motion   PT Next Visit Plan Functional strength      Patient will benefit from skilled therapeutic intervention in order to improve the following deficits and impairments:  Abnormal gait, Decreased mobility, Decreased range of motion, Decreased scar mobility, Decreased strength, Increased edema, Difficulty walking, Increased muscle spasms, Impaired flexibility, Pain  Visit Diagnosis: Stiffness of right knee, not elsewhere classified  Localized swelling, mass and lump, right lower limb  Pain in right knee  Difficulty in walking, not elsewhere classified     Problem List Patient Active Problem List   Diagnosis Date Noted  . Primary localized osteoarthritis of right knee   . History of MRSA infection   . History of measles   . Dysrhythmia   . Chronic rhinitis 06/06/2015  . Shortness of breath 06/06/2015  . Vaginitis and vulvovaginitis 04/25/2015  . Lumbar back pain 04/25/2015  . Medicare annual wellness visit, initial 04/07/2015  . Allergic response 01/01/2015  . History of chickenpox 09/26/2014  . History of  shingles   . Diabetes mellitus without complication ([place]) 39/76/7341  . 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 Deformity of metatarsal bone of left foot 03/03/2014  . Vulvar irritation  S/P neg vulvar bx 12/2013 01/23/2014  . 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 Right knee DJD 07/12/2013  . Neuralgia of left foot 06/24/2013  . Tarsal tunnel syndrome 04/08/2013  . Vaginal dryness, menopausal 04/05/2013  .  Pronation deformity of ankle, acquired 02/09/2013  . Cortical cataract 02/01/2013  . H/O bone density study 01/26/2013  . Chest pain 01/05/2013  . History of iritis 01/04/2013  . Dry eye syndrome 10/07/2012  . Meibomian gland disease 10/07/2012  . GERD (gastroesophageal reflux disease) 10/05/2012  . Essential hypertension, benign 10/04/2012  . Hyperlipidemia, mixed 10/04/2012  . 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 Diabetic neuropathy ([place]) 10/04/2012  . S/P hysterectomy 10/04/2012  . Osteopenia 10/04/2012  . Hypothyroidism 10/04/2012  . History of cystocele 10/04/2012  . Dyspareunia in female 10/04/2012  . Plantar fasciitis 10/04/2012  . Nuclear sclerotic cataract 06/17/2012  . Diabetes mellitus ([place]) 03/02/2012  . BP (high blood pressure) 01/06/2012  . Iritis 01/06/2012  . Neuropathy ([place]) 01/06/2012  . Disease of thyroid gland 01/06/2012  . Breast cancer ([place]) 05/06/2011    Scot Jun, PTA  08/29/2015, 2:33 PM  [place], [place], [address] Phone: [phone]   Fax:  [phone]  Name: Victoria Walker MRN: [id number] Date of Birth: 07-30-1950

## 2015-08-31 ENCOUNTER — Ambulatory Visit: Payer: Medicare HMO | Admitting: Physical Therapy

## 2015-08-31 ENCOUNTER — Encounter: Payer: Self-pay | Admitting: Physical Therapy

## 2015-08-31 DIAGNOSIS — M25661 Stiffness of right knee, not elsewhere classified: Secondary | ICD-10-CM

## 2015-08-31 DIAGNOSIS — R2241 Localized swelling, mass and lump, right lower limb: Secondary | ICD-10-CM

## 2015-08-31 NOTE — Therapy (Signed)
[place], [place], [address] Phone: [phone]   Fax:  [phone]  Physical Therapy Treatment  Patient Details  Name: Victoria Walker MRN: [id number] Date of Birth: 1950-11-11 Referring Provider: Noemi Chapel  Encounter Date: 08/31/2015      PT End of Session - 08/31/15 1153    Visit Number 17   Date for PT Re-Evaluation 09/22/15   PT Start Time 1100   PT Stop Time 1207   PT Time Calculation (min) 67 min      Past Medical History  Diagnosis Date  . Hypertension   . Neuropathy ([place])     FEET  . High cholesterol   . Hypothyroidism   . GERD (gastroesophageal reflux disease)   . Multinodular thyroid     PT HAVING TROUBLE SWALLOWING  . H/O hiatal hernia   . Claustrophobia   . H/O iritis     LEFT EYE - STATES HER EYE BECOMES RED AND VERY SENSITIVE TO LIGHT WHEN FLARE UP OF IRITIS  . History of chickenpox 09/26/2014  . History of measles   . History of shingles   . History of MRSA infection   . Medicare annual wellness visit, initial 04/07/2015  . Dysrhythmia     HX OF FAST HEART RATE AND PALPITATIONS - METOPROLOL HAS HELPED  . Wears glasses   . Spinal headache     spinal headache with epidural  . Heart murmur     "benign; 06/2015"  . Type II diabetes mellitus (HCC)     ORAL MEDICATION - NO INSULIN  . History of blood transfusion     "when I had my partial hysterectomy"  . Arthritis     "both knees" (07/02/2015)  . Primary localized osteoarthritis of right knee   . Cancer of right breast [facility])     Past Surgical History  Procedure Laterality Date  . Total thyroidectomy  1976    removed three tumor (2 on right; 1 on left)  . Plantar fascia surgery Left   . Abdominoplasty  1986  . Hernia repair    . Anterior and posterior repair  05/11/2012    Procedure: ANTERIOR (CYSTOCELE) AND POSTERIOR REPAIR (RECTOCELE);  Surgeon: Reece Packer, MD;  [facility];  Service: Urology;;   with graft  . Vaginal prolapse repair  05/11/2012    Procedure: VAGINAL VAULT SUSPENSION;  Surgeon: Reece Packer, MD;  [facility];  Service: Urology;;  . Cystoscopy with urethral dilatation  05/11/2012    Procedure: CYSTOSCOPY WITH URETHRAL DILATATION;  Surgeon: Reece Packer, MD;  [facility];  Service: Urology;;  . Thyroidectomy N/A 02/23/2014    Procedure: TOTAL THYROIDECTOMY;  Surgeon: Armandina Gemma, MD;  [facility];  Service: General;  Laterality: N/A;  . Vulva / perineum biopsy      vulvar  . Mastectomy Right 2007  . Colonoscopy    . Esophagogastroduodenoscopy    . Total knee arthroplasty Right 07/02/2015  . Umbilical hernia repair  1986  . Breast biopsy Right   . Abdominal hysterectomy  1988  . Tubal ligation    . Cardiac catheterization  1999  . Total knee arthroplasty Right 07/02/2015    Procedure: TOTAL KNEE ARTHROPLASTY;  Surgeon: Elsie Saas, MD;  Location: [facility];  Service: Orthopedics;  Laterality: Right;    There were no vitals filed for this visit.      Subjective Assessment - 08/31/15 1100    Subjective "  I had a good day yesterday, I drove" Pt repots that she had pain in ger knee last night and had to take a pain pill.   Currently in Pain? No/denies   Pain Score 0-No pain            [facility] PT Assessment - 08/31/15 0001    AROM   AROM Assessment Site Knee   Right/Left Knee Right   Right Knee Extension 3   Right Knee Flexion 108   PROM   PROM Assessment Site Knee   Right/Left Knee Right   Right Knee Extension 0   Right Knee Flexion 118                     [facility] Adult PT Treatment/Exercise - 08/31/15 0001    Ambulation/Gait   Stairs Yes   Stairs Assistance 5: Supervision;4: Min guard   Stair Management Technique One rail Right;Two rails;Alternating pattern   Number of Stairs 24   Height of Stairs 6   Gait Comments Pt very nervous when descending stairs. Pt reports anterior R knee pain with eccentric contractions.    Knee/Hip Exercises: Aerobic   Elliptical I5 R5 x4 min    Recumbent Bike L0 x2 min Seat 5   Nustep level 4 x 6 minutes   Knee/Hip Exercises: Machines for Strengthening   Cybex Knee Extension 10# 2x10; RLE only #5 x10   Cybex Knee Flexion RLE only #10 x10    Other Machine [employer] downs #30 2x10   Modalities   Modalities Vasopneumatic   Vasopneumatic   Number Minutes Vasopneumatic  15 minutes   Vasopnuematic Location  Knee   Vasopneumatic Pressure High   Vasopneumatic Temperature  34                  PT Short Term Goals - 07/26/15 1435    PT SHORT TERM GOAL #1   Title independent with initial HEP   Status Achieved           PT Long Term Goals - 08/22/15 1359    PT LONG TERM GOAL #2   Title increase AROM of the right knee to 4-115 degrees flexion   Status Partially Met               Plan - 08/31/15 1153    Clinical Impression Statement Pt has slightly improved her  L knee AROM. With stair negotiation pt reports anterior R knee pain with eccentric loads. Tolerated elliptical well able to complete entire duration without rest.    Rehab Potential Good   PT Frequency 3x / week   PT Duration 8 weeks   PT Treatment/Interventions ADLs/Self Care Home Management;Cryotherapy;Electrical Stimulation;Therapeutic exercise;Therapeutic activities;Functional mobility training;Stair training;Gait training;Balance training;Neuromuscular re-education;Patient/family education;Manual techniques;Passive range of motion   PT Next Visit Plan Functional strength, eccentric loads      Patient will benefit from skilled therapeutic intervention in order to improve the following deficits and impairments:  Abnormal gait, Decreased mobility, Decreased range of motion, Decreased scar mobility, Decreased strength, Increased edema, Difficulty walking, Increased muscle spasms, Impaired flexibility, Pain  Visit Diagnosis: Stiffness of right knee, not elsewhere classified  Localized  swelling, mass and lump, right lower limb     Problem List Patient Active Problem List   Diagnosis Date Noted  . Primary localized osteoarthritis of right knee   . History of MRSA infection   . History of measles   . Dysrhythmia   . Chronic rhinitis 06/06/2015  .  Shortness of breath 06/06/2015  . Vaginitis and vulvovaginitis 04/25/2015  . Lumbar back pain 04/25/2015  . Medicare annual wellness visit, initial 04/07/2015  . Allergic response 01/01/2015  . History of chickenpox 09/26/2014  . History of shingles   . Diabetes mellitus without complication ([place]) 18/28/8337  . 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 Deformity of metatarsal bone of left foot 03/03/2014  . Vulvar irritation  S/P neg vulvar bx 12/2013 01/23/2014  . 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 Right knee DJD 07/12/2013  . Neuralgia of left foot 06/24/2013  . Tarsal tunnel syndrome 04/08/2013  . Vaginal dryness, menopausal 04/05/2013  . Pronation deformity of ankle, acquired 02/09/2013  . Cortical cataract 02/01/2013  . H/O bone density study 01/26/2013  . Chest pain 01/05/2013  . History of iritis 01/04/2013  . Dry eye syndrome 10/07/2012  . Meibomian gland disease 10/07/2012  . GERD (gastroesophageal reflux disease) 10/05/2012  . Essential hypertension, benign 10/04/2012  . Hyperlipidemia, mixed 10/04/2012  . 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 Diabetic neuropathy ([place]) 10/04/2012  . S/P hysterectomy 10/04/2012  . Osteopenia 10/04/2012  . Hypothyroidism 10/04/2012  . History of cystocele 10/04/2012  . Dyspareunia in female 10/04/2012  . Plantar fasciitis 10/04/2012  . Nuclear sclerotic cataract 06/17/2012  . Diabetes mellitus ([place]) 03/02/2012  . BP (high blood pressure) 01/06/2012  . Iritis 01/06/2012  . Neuropathy ([place]) 01/06/2012  . Disease of thyroid gland 01/06/2012  . Breast cancer ([place]) 05/06/2011    Scot Jun, PTA  08/31/2015,  11:56 AM  [place], [place], [address] Phone: [phone]   Fax:  [phone]  Name: Victoria Walker MRN: [id number] Date of Birth: 03/08/1951

## 2015-09-03 ENCOUNTER — Encounter: Payer: Self-pay | Admitting: Physical Therapy

## 2015-09-03 ENCOUNTER — Ambulatory Visit: Payer: Medicare HMO | Admitting: Physical Therapy

## 2015-09-03 DIAGNOSIS — M25561 Pain in right knee: Secondary | ICD-10-CM

## 2015-09-03 DIAGNOSIS — R2241 Localized swelling, mass and lump, right lower limb: Secondary | ICD-10-CM

## 2015-09-03 DIAGNOSIS — M25661 Stiffness of right knee, not elsewhere classified: Secondary | ICD-10-CM | POA: Diagnosis not present

## 2015-09-03 NOTE — Therapy (Signed)
[place], [place], [address] Phone: [phone]   Fax:  [phone]  Physical Therapy Treatment  Patient Details  Name: Victoria Walker MRN: [id number] Date of Birth: March 17, 1951 Referring Provider: Noemi Chapel  Encounter Date: 09/03/2015      PT End of Session - 09/03/15 1516    Visit Number 18   Date for PT Re-Evaluation 09/22/15   PT Start Time 1430   PT Stop Time 1529   PT Time Calculation (min) 59 min   Activity Tolerance Patient tolerated treatment well   Behavior During Therapy [facility] for tasks assessed/performed      Past Medical History  Diagnosis Date  . Hypertension   . Neuropathy ([place])     FEET  . High cholesterol   . Hypothyroidism   . GERD (gastroesophageal reflux disease)   . Multinodular thyroid     PT HAVING TROUBLE SWALLOWING  . H/O hiatal hernia   . Claustrophobia   . H/O iritis     LEFT EYE - STATES HER EYE BECOMES RED AND VERY SENSITIVE TO LIGHT WHEN FLARE UP OF IRITIS  . History of chickenpox 09/26/2014  . History of measles   . History of shingles   . History of MRSA infection   . Medicare annual wellness visit, initial 04/07/2015  . Dysrhythmia     HX OF FAST HEART RATE AND PALPITATIONS - METOPROLOL HAS HELPED  . Wears glasses   . Spinal headache     spinal headache with epidural  . Heart murmur     "benign; 06/2015"  . Type II diabetes mellitus (HCC)     ORAL MEDICATION - NO INSULIN  . History of blood transfusion     "when I had my partial hysterectomy"  . Arthritis     "both knees" (07/02/2015)  . Primary localized osteoarthritis of right knee   . Cancer of right breast [facility])     Past Surgical History  Procedure Laterality Date  . Total thyroidectomy  1976    removed three tumor (2 on right; 1 on left)  . Plantar fascia surgery Left   . Abdominoplasty  1986  . Hernia repair    . Anterior and posterior repair  05/11/2012    Procedure: ANTERIOR  (CYSTOCELE) AND POSTERIOR REPAIR (RECTOCELE);  Surgeon: Reece Packer, MD;  [facility];  Service: Urology;;  with graft  . Vaginal prolapse repair  05/11/2012    Procedure: VAGINAL VAULT SUSPENSION;  Surgeon: Reece Packer, MD;  [facility];  Service: Urology;;  . Cystoscopy with urethral dilatation  05/11/2012    Procedure: CYSTOSCOPY WITH URETHRAL DILATATION;  Surgeon: Reece Packer, MD;  [facility];  Service: Urology;;  . Thyroidectomy N/A 02/23/2014    Procedure: TOTAL THYROIDECTOMY;  Surgeon: Armandina Gemma, MD;  [facility];  Service: General;  Laterality: N/A;  . Vulva / perineum biopsy      vulvar  . Mastectomy Right 2007  . Colonoscopy    . Esophagogastroduodenoscopy    . Total knee arthroplasty Right 07/02/2015  . Umbilical hernia repair  1986  . Breast biopsy Right   . Abdominal hysterectomy  1988  . Tubal ligation    . Cardiac catheterization  1999  . Total knee arthroplasty Right 07/02/2015    Procedure: TOTAL KNEE ARTHROPLASTY;  Surgeon: Elsie Saas, MD;  Location: [place];  Service: Orthopedics;  Laterality: Right;    There were no vitals filed  for this visit.      Subjective Assessment - 09/03/15 1434    Subjective Im all right a little drowsy, knee stiff.   Currently in Pain? No/denies   Pain Score 0-No pain                         [facility] Adult PT Treatment/Exercise - 09/03/15 0001    Knee/Hip Exercises: Aerobic   Elliptical I10 R5 x4 min    Nustep level 5 x 6 minutes   Knee/Hip Exercises: Machines for Strengthening   Cybex Leg Press #50 2x10; RLE only #30 2x5   Knee/Hip Exercises: Standing   Forward Step Up 2 sets;10 reps;Hand Hold: 0;Step Height: 6";Right   Step Down Hand Hold: 1;10 reps;3 sets  Control descent with RLE    Other Standing Knee Exercises Heel raises black bar 2x15   Knee/Hip Exercises: Seated   Sit to Sand 10 reps;without UE support;2 sets  blue ball                  PT Short Term  Goals - 07/26/15 1435    PT SHORT TERM GOAL #1   Title independent with initial HEP   Status Achieved           PT Long Term Goals - 08/22/15 1359    PT LONG TERM GOAL #2   Title increase AROM of the right knee to 4-115 degrees flexion   Status Partially Met               Plan - 09/03/15 1516    Clinical Impression Statement Pt tolerated addition weight on leg press intervention bilat and single leg. Demos control with eccentric step downs with RLE. Pt tolerance with elliptical continues to improve.   Rehab Potential Good   PT Frequency 3x / week   PT Duration 8 weeks   PT Treatment/Interventions ADLs/Self Care Home Management;Cryotherapy;Electrical Stimulation;Therapeutic exercise;Therapeutic activities;Functional mobility training;Stair training;Gait training;Balance training;Neuromuscular re-education;Patient/family education;Manual techniques;Passive range of motion   PT Next Visit Plan Functional strength, eccentric loads      Patient will benefit from skilled therapeutic intervention in order to improve the following deficits and impairments:  Abnormal gait, Decreased mobility, Decreased range of motion, Decreased scar mobility, Decreased strength, Increased edema, Difficulty walking, Increased muscle spasms, Impaired flexibility, Pain  Visit Diagnosis: Stiffness of right knee, not elsewhere classified  Localized swelling, mass and lump, right lower limb  Pain in right knee     Problem List Patient Active Problem List   Diagnosis Date Noted  . Primary localized osteoarthritis of right knee   . History of MRSA infection   . History of measles   . Dysrhythmia   . Chronic rhinitis 06/06/2015  . Shortness of breath 06/06/2015  . Vaginitis and vulvovaginitis 04/25/2015  . Lumbar back pain 04/25/2015  . Medicare annual wellness visit, initial 04/07/2015  . Allergic response 01/01/2015  . History of chickenpox 09/26/2014  . History of shingles   . Diabetes  mellitus without complication ([place]) 96/07/5407  . 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 Deformity of metatarsal bone of left foot 03/03/2014  . Vulvar irritation  S/P neg vulvar bx 12/2013 01/23/2014  . 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 Right knee DJD 07/12/2013  . Neuralgia of left foot 06/24/2013  . Tarsal tunnel syndrome 04/08/2013  . Vaginal dryness, menopausal 04/05/2013  . Pronation deformity of ankle, acquired 02/09/2013  .  Cortical cataract 02/01/2013  . H/O bone density study 01/26/2013  . Chest pain 01/05/2013  . History of iritis 01/04/2013  . Dry eye syndrome 10/07/2012  . Meibomian gland disease 10/07/2012  . GERD (gastroesophageal reflux disease) 10/05/2012  . Essential hypertension, benign 10/04/2012  . Hyperlipidemia, mixed 10/04/2012  . 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 Diabetic neuropathy ([place]) 10/04/2012  . S/P hysterectomy 10/04/2012  . Osteopenia 10/04/2012  . Hypothyroidism 10/04/2012  . History of cystocele 10/04/2012  . Dyspareunia in female 10/04/2012  . Plantar fasciitis 10/04/2012  . Nuclear sclerotic cataract 06/17/2012  . Diabetes mellitus ([place]) 03/02/2012  . BP (high blood pressure) 01/06/2012  . Iritis 01/06/2012  . Neuropathy ([place]) 01/06/2012  . Disease of thyroid gland 01/06/2012  . Breast cancer ([place]) 05/06/2011    Scot Jun, PTA  09/03/2015, 3:18 PM  [place], [place], [address] Phone: [phone]   Fax:  [phone]  Name: Victoria Walker MRN: [id number] Date of Birth: 31-Aug-1950

## 2015-09-05 ENCOUNTER — Ambulatory Visit: Payer: Medicare HMO | Admitting: Physical Therapy

## 2015-09-05 ENCOUNTER — Encounter: Payer: Self-pay | Admitting: Physical Therapy

## 2015-09-05 DIAGNOSIS — R2241 Localized swelling, mass and lump, right lower limb: Secondary | ICD-10-CM

## 2015-09-05 DIAGNOSIS — M25561 Pain in right knee: Secondary | ICD-10-CM

## 2015-09-05 DIAGNOSIS — M25661 Stiffness of right knee, not elsewhere classified: Secondary | ICD-10-CM

## 2015-09-05 DIAGNOSIS — R262 Difficulty in walking, not elsewhere classified: Secondary | ICD-10-CM

## 2015-09-05 NOTE — Therapy (Signed)
[place], [place], [address] Phone: [phone]   Fax:  [phone]  Physical Therapy Treatment  Patient Details  Name: Victoria Walker MRN: [id number] Date of Birth: December 28, 1950 Referring Provider: Noemi Chapel  Encounter Date: 09/05/2015      PT End of Session - 09/05/15 1145    Visit Number 19   Date for PT Re-Evaluation 09/22/15   PT Start Time 1100   PT Stop Time 1158   PT Time Calculation (min) 58 min   Activity Tolerance Patient tolerated treatment well   Behavior During Therapy [facility] for tasks assessed/performed      Past Medical History  Diagnosis Date  . Hypertension   . Neuropathy ([place])     FEET  . High cholesterol   . Hypothyroidism   . GERD (gastroesophageal reflux disease)   . Multinodular thyroid     PT HAVING TROUBLE SWALLOWING  . H/O hiatal hernia   . Claustrophobia   . H/O iritis     LEFT EYE - STATES HER EYE BECOMES RED AND VERY SENSITIVE TO LIGHT WHEN FLARE UP OF IRITIS  . History of chickenpox 09/26/2014  . History of measles   . History of shingles   . History of MRSA infection   . Medicare annual wellness visit, initial 04/07/2015  . Dysrhythmia     HX OF FAST HEART RATE AND PALPITATIONS - METOPROLOL HAS HELPED  . Wears glasses   . Spinal headache     spinal headache with epidural  . Heart murmur     "benign; 06/2015"  . Type II diabetes mellitus (HCC)     ORAL MEDICATION - NO INSULIN  . History of blood transfusion     "when I had my partial hysterectomy"  . Arthritis     "both knees" (07/02/2015)  . Primary localized osteoarthritis of right knee   . Cancer of right breast [facility])     Past Surgical History  Procedure Laterality Date  . Total thyroidectomy  1976    removed three tumor (2 on right; 1 on left)  . Plantar fascia surgery Left   . Abdominoplasty  1986  . Hernia repair    . Anterior and posterior repair  05/11/2012    Procedure: ANTERIOR  (CYSTOCELE) AND POSTERIOR REPAIR (RECTOCELE);  Surgeon: Reece Packer, MD;  [facility];  Service: Urology;;  with graft  . Vaginal prolapse repair  05/11/2012    Procedure: VAGINAL VAULT SUSPENSION;  Surgeon: Reece Packer, MD;  [facility];  Service: Urology;;  . Cystoscopy with urethral dilatation  05/11/2012    Procedure: CYSTOSCOPY WITH URETHRAL DILATATION;  Surgeon: Reece Packer, MD;  [facility];  Service: Urology;;  . Thyroidectomy N/A 02/23/2014    Procedure: TOTAL THYROIDECTOMY;  Surgeon: Armandina Gemma, MD;  [facility];  Service: General;  Laterality: N/A;  . Vulva / perineum biopsy      vulvar  . Mastectomy Right 2007  . Colonoscopy    . Esophagogastroduodenoscopy    . Total knee arthroplasty Right 07/02/2015  . Umbilical hernia repair  1986  . Breast biopsy Right   . Abdominal hysterectomy  1988  . Tubal ligation    . Cardiac catheterization  1999  . Total knee arthroplasty Right 07/02/2015    Procedure: TOTAL KNEE ARTHROPLASTY;  Surgeon: Elsie Saas, MD;  Location: [facility] [place];  Service: Orthopedics;  Laterality: Right;    There were no vitals filed  for this visit.      Subjective Assessment - 09/05/15 1100    Subjective "It good"   Currently in Pain? Yes   Pain Score 1    Pain Location Knee   Pain Descriptors / Indicators Tightness                         [facility] Adult PT Treatment/Exercise - 09/05/15 0001    Ambulation/Gait   Stairs Yes   Stairs Assistance 5: Supervision   Stair Management Technique One rail Right;Two rails;Alternating pattern   Number of Stairs 24   Height of Stairs 6   Gait Comments X3   Knee/Hip Exercises: Aerobic   Elliptical I10 R5 x 5 min    Nustep level 5 x 5 minutes   Knee/Hip Exercises: Machines for Strengthening   Cybex Knee Extension RLE only #10 2x10   Cybex Knee Flexion RLE only #15 2x10    Knee/Hip Exercises: Standing   Other Standing Knee Exercises Heel raises black bar 2x15    Other Standing Knee Exercises Standing hip abd & ext #5 2x10   Modalities   Modalities Vasopneumatic   Vasopneumatic   Number Minutes Vasopneumatic  15 minutes   Vasopnuematic Location  Knee   Vasopneumatic Pressure High   Vasopneumatic Temperature  34                  PT Short Term Goals - 07/26/15 1435    PT SHORT TERM GOAL #1   Title independent with initial HEP   Status Achieved           PT Long Term Goals - 09/05/15 1152    PT LONG TERM GOAL #1   Title decrease pain 50%   PT LONG TERM GOAL #3   Title walk witout assistive device   PT LONG TERM GOAL #4   Title goe up and down steps step over step   Status Partially Met               Plan - 09/05/15 1147    Clinical Impression Statement Pt negotiated 3 flights of stairs, Pt hesitant when controlling descents with RLE. Tolerated additional exercises without  c/o increase pain. Pt does walk with a mild limp, pt also avoid full weight bearing on RLE at times.   PT Frequency 3x / week   PT Duration 8 weeks   PT Treatment/Interventions ADLs/Self Care Home Management;Cryotherapy;Electrical Stimulation;Therapeutic exercise;Therapeutic activities;Functional mobility training;Stair training;Gait training;Balance training;Neuromuscular re-education;Patient/family education;Manual techniques;Passive range of motion   PT Next Visit Plan Functional strength, eccentric loads      Patient will benefit from skilled therapeutic intervention in order to improve the following deficits and impairments:  Abnormal gait, Decreased mobility, Decreased range of motion, Decreased scar mobility, Decreased strength, Increased edema, Difficulty walking, Increased muscle spasms, Impaired flexibility, Pain  Visit Diagnosis: Stiffness of right knee, not elsewhere classified  Localized swelling, mass and lump, right lower limb  Pain in right knee  Difficulty in walking, not elsewhere classified     Problem List Patient  Active Problem List   Diagnosis Date Noted  . Primary localized osteoarthritis of right knee   . History of MRSA infection   . History of measles   . Dysrhythmia   . Chronic rhinitis 06/06/2015  . Shortness of breath 06/06/2015  . Vaginitis and vulvovaginitis 04/25/2015  . Lumbar back pain 04/25/2015  . Medicare annual wellness visit, initial 04/07/2015  . Allergic response 01/01/2015  .  History of chickenpox 09/26/2014  . History of shingles   . Diabetes mellitus without complication ([place]) 62/06/5595  . 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 Deformity of metatarsal bone of left foot 03/03/2014  . Vulvar irritation  S/P neg vulvar bx 12/2013 01/23/2014  . 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 Right knee DJD 07/12/2013  . Neuralgia of left foot 06/24/2013  . Tarsal tunnel syndrome 04/08/2013  . Vaginal dryness, menopausal 04/05/2013  . Pronation deformity of ankle, acquired 02/09/2013  . Cortical cataract 02/01/2013  . H/O bone density study 01/26/2013  . Chest pain 01/05/2013  . History of iritis 01/04/2013  . Dry eye syndrome 10/07/2012  . Meibomian gland disease 10/07/2012  . GERD (gastroesophageal reflux disease) 10/05/2012  . Essential hypertension, benign 10/04/2012  . Hyperlipidemia, mixed 10/04/2012  . 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 Diabetic neuropathy ([place]) 10/04/2012  . S/P hysterectomy 10/04/2012  . Osteopenia 10/04/2012  . Hypothyroidism 10/04/2012  . History of cystocele 10/04/2012  . Dyspareunia in female 10/04/2012  . Plantar fasciitis 10/04/2012  . Nuclear sclerotic cataract 06/17/2012  . Diabetes mellitus ([place]) 03/02/2012  . BP (high blood pressure) 01/06/2012  . Iritis 01/06/2012  . Neuropathy ([place]) 01/06/2012  . Disease of thyroid gland 01/06/2012  . Breast cancer ([place]) 05/06/2011    Scot Jun, PTA  09/05/2015, 11:53 AM  [place], [place], [address] Phone: [phone]   Fax:  [phone]  Name: Victoria Walker MRN: [id number] Date of Birth: Sep 30, 1950

## 2015-09-07 ENCOUNTER — Encounter: Payer: Self-pay | Admitting: Physical Therapy

## 2015-09-07 ENCOUNTER — Ambulatory Visit: Payer: Medicare HMO | Admitting: Physical Therapy

## 2015-09-07 DIAGNOSIS — R2241 Localized swelling, mass and lump, right lower limb: Secondary | ICD-10-CM

## 2015-09-07 DIAGNOSIS — M25661 Stiffness of right knee, not elsewhere classified: Secondary | ICD-10-CM

## 2015-09-07 NOTE — Therapy (Signed)
[place], [place], [address] Phone: [phone]   Fax:  [phone]  Physical Therapy Treatment  Patient Details  Name: Victoria Walker MRN: [id number] Date of Birth: 05-Jul-1950 Referring Provider: Noemi Chapel  Encounter Date: 09/07/2015      PT End of Session - 09/07/15 0927    Visit Number 20   Date for PT Re-Evaluation 09/22/15   PT Start Time 0845   PT Stop Time 1039   PT Time Calculation (min) 114 min   Activity Tolerance Patient tolerated treatment well   Behavior During Therapy [facility] for tasks assessed/performed      Past Medical History  Diagnosis Date  . Hypertension   . Neuropathy ([place])     FEET  . High cholesterol   . Hypothyroidism   . GERD (gastroesophageal reflux disease)   . Multinodular thyroid     PT HAVING TROUBLE SWALLOWING  . H/O hiatal hernia   . Claustrophobia   . H/O iritis     LEFT EYE - STATES HER EYE BECOMES RED AND VERY SENSITIVE TO LIGHT WHEN FLARE UP OF IRITIS  . History of chickenpox 09/26/2014  . History of measles   . History of shingles   . History of MRSA infection   . Medicare annual wellness visit, initial 04/07/2015  . Dysrhythmia     HX OF FAST HEART RATE AND PALPITATIONS - METOPROLOL HAS HELPED  . Wears glasses   . Spinal headache     spinal headache with epidural  . Heart murmur     "benign; 06/2015"  . Type II diabetes mellitus (HCC)     ORAL MEDICATION - NO INSULIN  . History of blood transfusion     "when I had my partial hysterectomy"  . Arthritis     "both knees" (07/02/2015)  . Primary localized osteoarthritis of right knee   . Cancer of right breast [facility])     Past Surgical History  Procedure Laterality Date  . Total thyroidectomy  1976    removed three tumor (2 on right; 1 on left)  . Plantar fascia surgery Left   . Abdominoplasty  1986  . Hernia repair    . Anterior and posterior repair  05/11/2012    Procedure: ANTERIOR  (CYSTOCELE) AND POSTERIOR REPAIR (RECTOCELE);  Surgeon: Reece Packer, MD;  [facility];  Service: Urology;;  with graft  . Vaginal prolapse repair  05/11/2012    Procedure: VAGINAL VAULT SUSPENSION;  Surgeon: Reece Packer, MD;  [facility];  Service: Urology;;  . Cystoscopy with urethral dilatation  05/11/2012    Procedure: CYSTOSCOPY WITH URETHRAL DILATATION;  Surgeon: Reece Packer, MD;  [facility];  Service: Urology;;  . Thyroidectomy N/A 02/23/2014    Procedure: TOTAL THYROIDECTOMY;  Surgeon: Armandina Gemma, MD;  [facility];  Service: General;  Laterality: N/A;  . Vulva / perineum biopsy      vulvar  . Mastectomy Right 2007  . Colonoscopy    . Esophagogastroduodenoscopy    . Total knee arthroplasty Right 07/02/2015  . Umbilical hernia repair  1986  . Breast biopsy Right   . Abdominal hysterectomy  1988  . Tubal ligation    . Cardiac catheterization  1999  . Total knee arthroplasty Right 07/02/2015    Procedure: TOTAL KNEE ARTHROPLASTY;  Surgeon: Elsie Saas, MD;  Location: [place];  Service: Orthopedics;  Laterality: Right;    There were no vitals filed  for this visit.      Subjective Assessment - 09/07/15 0840    Subjective "I had to get up last night and take me a pain pill, I worked hard yesterday"   Currently in Pain? No/denies   Pain Score 0-No pain            [facility] PT Assessment - 09/07/15 0001    AROM   AROM Assessment Site Knee   Right/Left Knee Right   Right Knee Extension 4   Right Knee Flexion 110                     [facility] Adult PT Treatment/Exercise - 09/07/15 0001    Ambulation/Gait   Stairs Yes   Stairs Assistance 5: Supervision   Stairs Assistance Details (indicate cue type and reason) 4   Stair Management Technique One rail Right;Two rails;Alternating pattern   Number of Stairs 24   Height of Stairs 6   Gait Comments X2   Knee/Hip Exercises: Aerobic   Elliptical I10 R5 x 5 min    Nustep level 5 x 5  minutes   Knee/Hip Exercises: Machines for Strengthening   Cybex Knee Extension RLE only #10 2x10   Cybex Knee Flexion RLE only #15 2x10    Knee/Hip Exercises: Standing   Other Standing Knee Exercises Spord cord walking 4 way #20 x3    Other Standing Knee Exercises Standing hip abd & ext #5 2x10                  PT Short Term Goals - 07/26/15 1435    PT SHORT TERM GOAL #1   Title independent with initial HEP   Status Achieved           PT Long Term Goals - 09/05/15 1152    PT LONG TERM GOAL #1   Title decrease pain 50%   PT LONG TERM GOAL #3   Title walk witout assistive device   PT LONG TERM GOAL #4   Title goe up and down steps step over step   Status Partially Met               Plan - 09/07/15 0928    Clinical Impression Statement Pt continues to do well but remains hesitant when descending stairs. Pt remains with a mild limp, tolerated all exercises well.   Rehab Potential Good   PT Frequency 3x / week   PT Duration 8 weeks   PT Treatment/Interventions ADLs/Self Care Home Management;Cryotherapy;Electrical Stimulation;Therapeutic exercise;Therapeutic activities;Functional mobility training;Stair training;Gait training;Balance training;Neuromuscular re-education;Patient/family education;Manual techniques;Passive range of motion   PT Next Visit Plan Functional strength, eccentric loads      Patient will benefit from skilled therapeutic intervention in order to improve the following deficits and impairments:  Abnormal gait, Decreased mobility, Decreased range of motion, Decreased scar mobility, Decreased strength, Increased edema, Difficulty walking, Increased muscle spasms, Impaired flexibility, Pain  Visit Diagnosis: Stiffness of right knee, not elsewhere classified  Localized swelling, mass and lump, right lower limb     Problem List Patient Active Problem List   Diagnosis Date Noted  . Primary localized osteoarthritis of right knee   .  History of MRSA infection   . History of measles   . Dysrhythmia   . Chronic rhinitis 06/06/2015  . Shortness of breath 06/06/2015  . Vaginitis and vulvovaginitis 04/25/2015  . Lumbar back pain 04/25/2015  . Medicare annual wellness visit, initial 04/07/2015  . Allergic response 01/01/2015  .  History of chickenpox 09/26/2014  . History of shingles   . Diabetes mellitus without complication ([place]) 59/97/7414  . 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 Deformity of metatarsal bone of left foot 03/03/2014  . Vulvar irritation  S/P neg vulvar bx 12/2013 01/23/2014  . 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 Right knee DJD 07/12/2013  . Neuralgia of left foot 06/24/2013  . Tarsal tunnel syndrome 04/08/2013  . Vaginal dryness, menopausal 04/05/2013  . Pronation deformity of ankle, acquired 02/09/2013  . Cortical cataract 02/01/2013  . H/O bone density study 01/26/2013  . Chest pain 01/05/2013  . History of iritis 01/04/2013  . Dry eye syndrome 10/07/2012  . Meibomian gland disease 10/07/2012  . GERD (gastroesophageal reflux disease) 10/05/2012  . Essential hypertension, benign 10/04/2012  . Hyperlipidemia, mixed 10/04/2012  . 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 Diabetic neuropathy ([place]) 10/04/2012  . S/P hysterectomy 10/04/2012  . Osteopenia 10/04/2012  . Hypothyroidism 10/04/2012  . History of cystocele 10/04/2012  . Dyspareunia in female 10/04/2012  . Plantar fasciitis 10/04/2012  . Nuclear sclerotic cataract 06/17/2012  . Diabetes mellitus ([place]) 03/02/2012  . BP (high blood pressure) 01/06/2012  . Iritis 01/06/2012  . Neuropathy ([place]) 01/06/2012  . Disease of thyroid gland 01/06/2012  . Breast cancer ([place]) 05/06/2011    Scot Jun, PTA  09/07/2015, 9:31 AM  [place], [place], [address] Phone: [phone]   Fax:   [phone]  Name: Victoria Walker MRN: [id number] Date of Birth: 1950-07-26

## 2015-09-10 ENCOUNTER — Ambulatory Visit: Payer: Medicare HMO | Admitting: Physical Therapy

## 2015-09-10 ENCOUNTER — Encounter: Payer: Self-pay | Admitting: Physical Therapy

## 2015-09-10 DIAGNOSIS — M25661 Stiffness of right knee, not elsewhere classified: Secondary | ICD-10-CM | POA: Diagnosis not present

## 2015-09-10 DIAGNOSIS — R262 Difficulty in walking, not elsewhere classified: Secondary | ICD-10-CM

## 2015-09-10 DIAGNOSIS — M25561 Pain in right knee: Secondary | ICD-10-CM

## 2015-09-10 DIAGNOSIS — R2241 Localized swelling, mass and lump, right lower limb: Secondary | ICD-10-CM

## 2015-09-10 NOTE — Therapy (Signed)
[place], [place], [address] Phone: [phone]   Fax:  [phone]  Physical Therapy Treatment  Patient Details  Name: Victoria Walker MRN: [id number] Date of Birth: 06-21-1950 Referring Provider: Noemi Chapel  Encounter Date: 09/10/2015      PT End of Session - 09/10/15 1516    Visit Number 21   Date for PT Re-Evaluation 09/22/15   PT Start Time 1425   PT Stop Time 1530   PT Time Calculation (min) 65 min   Activity Tolerance Patient tolerated treatment well   Behavior During Therapy [facility] for tasks assessed/performed      Past Medical History  Diagnosis Date  . Hypertension   . Neuropathy ([place])     FEET  . High cholesterol   . Hypothyroidism   . GERD (gastroesophageal reflux disease)   . Multinodular thyroid     PT HAVING TROUBLE SWALLOWING  . H/O hiatal hernia   . Claustrophobia   . H/O iritis     LEFT EYE - STATES HER EYE BECOMES RED AND VERY SENSITIVE TO LIGHT WHEN FLARE UP OF IRITIS  . History of chickenpox 09/26/2014  . History of measles   . History of shingles   . History of MRSA infection   . Medicare annual wellness visit, initial 04/07/2015  . Dysrhythmia     HX OF FAST HEART RATE AND PALPITATIONS - METOPROLOL HAS HELPED  . Wears glasses   . Spinal headache     spinal headache with epidural  . Heart murmur     "benign; 06/2015"  . Type II diabetes mellitus (HCC)     ORAL MEDICATION - NO INSULIN  . History of blood transfusion     "when I had my partial hysterectomy"  . Arthritis     "both knees" (07/02/2015)  . Primary localized osteoarthritis of right knee   . Cancer of right breast [facility])     Past Surgical History  Procedure Laterality Date  . Total thyroidectomy  1976    removed three tumor (2 on right; 1 on left)  . Plantar fascia surgery Left   . Abdominoplasty  1986  . Hernia repair    . Anterior and posterior repair  05/11/2012    Procedure: ANTERIOR  (CYSTOCELE) AND POSTERIOR REPAIR (RECTOCELE);  Surgeon: Reece Packer, MD;  [facility];  Service: Urology;;  with graft  . Vaginal prolapse repair  05/11/2012    Procedure: VAGINAL VAULT SUSPENSION;  Surgeon: Reece Packer, MD;  [facility];  Service: Urology;;  . Cystoscopy with urethral dilatation  05/11/2012    Procedure: CYSTOSCOPY WITH URETHRAL DILATATION;  Surgeon: Reece Packer, MD;  [facility];  Service: Urology;;  . Thyroidectomy N/A 02/23/2014    Procedure: TOTAL THYROIDECTOMY;  Surgeon: Armandina Gemma, MD;  [facility];  Service: General;  Laterality: N/A;  . Vulva / perineum biopsy      vulvar  . Mastectomy Right 2007  . Colonoscopy    . Esophagogastroduodenoscopy    . Total knee arthroplasty Right 07/02/2015  . Umbilical hernia repair  1986  . Breast biopsy Right   . Abdominal hysterectomy  1988  . Tubal ligation    . Cardiac catheterization  1999  . Total knee arthroplasty Right 07/02/2015    Procedure: TOTAL KNEE ARTHROPLASTY;  Surgeon: Elsie Saas, MD;  Location: [place];  Service: Orthopedics;  Laterality: Right;    There were no vitals filed  for this visit.      Subjective Assessment - 09/10/15 1423    Subjective "Oh just pain, When it get cool that's what happen"   Currently in Pain? Yes   Pain Score 3    Pain Location Knee   Pain Orientation Right                         [facility] Adult PT Treatment/Exercise - 09/10/15 0001    High Level Balance   High Level Balance Activities Negotiating over obstacles  Side steps over obstacles   High Level Balance Comments standing march on airex   Knee/Hip Exercises: Aerobic   Elliptical I10 R5 x 5 min    Nustep level 5 x 5 minutes   Knee/Hip Exercises: Machines for Strengthening   Cybex Leg Press #50 2x10; RLE only #30 2x5   Knee/Hip Exercises: Standing   Other Standing Knee Exercises Sport cord walking 4 way #20 x3    Other Standing Knee Exercises Sit to stand #10 2x10    Modalities   Modalities Vasopneumatic   Vasopneumatic   Number Minutes Vasopneumatic  15 minutes   Vasopnuematic Location  Knee   Vasopneumatic Pressure High   Vasopneumatic Temperature  34                  PT Short Term Goals - 07/26/15 1435    PT SHORT TERM GOAL #1   Title independent with initial HEP   Status Achieved           PT Long Term Goals - 09/05/15 1152    PT LONG TERM GOAL #1   Title decrease pain 50%   PT LONG TERM GOAL #3   Title walk witout assistive device   PT LONG TERM GOAL #4   Title goe up and down steps step over step   Status Partially Met               Plan - 09/10/15 1517    Clinical Impression Statement Pt performed well this date, Mild instability when negotiation over obstacles. Demos good command with sport cord walking. Progressed with higher weight when doing sit to stands.   Rehab Potential Good   PT Frequency 3x / week   PT Duration 8 weeks   PT Treatment/Interventions ADLs/Self Care Home Management;Cryotherapy;Electrical Stimulation;Therapeutic exercise;Therapeutic activities;Functional mobility training;Stair training;Gait training;Balance training;Neuromuscular re-education;Patient/family education;Manual techniques;Passive range of motion   PT Next Visit Plan MD tomorrow, Possible D/C      Patient will benefit from skilled therapeutic intervention in order to improve the following deficits and impairments:  Abnormal gait, Decreased mobility, Decreased range of motion, Decreased scar mobility, Decreased strength, Increased edema, Difficulty walking, Increased muscle spasms, Impaired flexibility, Pain  Visit Diagnosis: Stiffness of right knee, not elsewhere classified  Localized swelling, mass and lump, right lower limb  Pain in right knee  Difficulty in walking, not elsewhere classified     Problem List Patient Active Problem List   Diagnosis Date Noted  . Primary localized osteoarthritis of right knee    . History of MRSA infection   . History of measles   . Dysrhythmia   . Chronic rhinitis 06/06/2015  . Shortness of breath 06/06/2015  . Vaginitis and vulvovaginitis 04/25/2015  . Lumbar back pain 04/25/2015  . Medicare annual wellness visit, initial 04/07/2015  . Allergic response 01/01/2015  . History of chickenpox 09/26/2014  . History of shingles   . Diabetes mellitus without complication (  [place]) 05/25/2014  . 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 Deformity of metatarsal bone of left foot 03/03/2014  . Vulvar irritation  S/P neg vulvar bx 12/2013 01/23/2014  . 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 Right knee DJD 07/12/2013  . Neuralgia of left foot 06/24/2013  . Tarsal tunnel syndrome 04/08/2013  . Vaginal dryness, menopausal 04/05/2013  . Pronation deformity of ankle, acquired 02/09/2013  . Cortical cataract 02/01/2013  . H/O bone density study 01/26/2013  . Chest pain 01/05/2013  . History of iritis 01/04/2013  . Dry eye syndrome 10/07/2012  . Meibomian gland disease 10/07/2012  . GERD (gastroesophageal reflux disease) 10/05/2012  . Essential hypertension, benign 10/04/2012  . Hyperlipidemia, mixed 10/04/2012  . 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 Diabetic neuropathy ([place]) 10/04/2012  . S/P hysterectomy 10/04/2012  . Osteopenia 10/04/2012  . Hypothyroidism 10/04/2012  . History of cystocele 10/04/2012  . Dyspareunia in female 10/04/2012  . Plantar fasciitis 10/04/2012  . Nuclear sclerotic cataract 06/17/2012  . Diabetes mellitus ([place]) 03/02/2012  . BP (high blood pressure) 01/06/2012  . Iritis 01/06/2012  . Neuropathy ([place]) 01/06/2012  . Disease of thyroid gland 01/06/2012  . Breast cancer ([place]) 05/06/2011    Scot Jun, PTA  09/10/2015, 3:19 PM  [place], [place], [address] Phone: [phone]   Fax:   [phone]  Name: Anyiah Coverdale MRN: [id number] Date of Birth: 1950/05/26

## 2015-09-11 DIAGNOSIS — E119 Type 2 diabetes mellitus without complications: Secondary | ICD-10-CM | POA: Diagnosis not present

## 2015-09-12 ENCOUNTER — Ambulatory Visit: Payer: Medicare HMO | Admitting: Physical Therapy

## 2015-09-12 ENCOUNTER — Encounter: Payer: Self-pay | Admitting: Physical Therapy

## 2015-09-12 DIAGNOSIS — R262 Difficulty in walking, not elsewhere classified: Secondary | ICD-10-CM

## 2015-09-12 DIAGNOSIS — M25561 Pain in right knee: Secondary | ICD-10-CM

## 2015-09-12 DIAGNOSIS — R2241 Localized swelling, mass and lump, right lower limb: Secondary | ICD-10-CM

## 2015-09-12 DIAGNOSIS — M25661 Stiffness of right knee, not elsewhere classified: Secondary | ICD-10-CM

## 2015-09-12 NOTE — Therapy (Signed)
[place], [place], [address] Phone: [phone]   Fax:  [phone]  Physical Therapy Treatment  Patient Details  Name: Victoria Walker MRN: [id number] Date of Birth: 1950/08/15 Referring Provider: Noemi Chapel  Encounter Date: 09/12/2015      PT End of Session - 09/12/15 1515    Visit Number 22   Date for PT Re-Evaluation 09/22/15   PT Start Time 1430   PT Stop Time 1530   PT Time Calculation (min) 60 min   Activity Tolerance Patient tolerated treatment well   Behavior During Therapy [facility] for tasks assessed/performed      Past Medical History  Diagnosis Date  . Hypertension   . Neuropathy ([place])     FEET  . High cholesterol   . Hypothyroidism   . GERD (gastroesophageal reflux disease)   . Multinodular thyroid     PT HAVING TROUBLE SWALLOWING  . H/O hiatal hernia   . Claustrophobia   . H/O iritis     LEFT EYE - STATES HER EYE BECOMES RED AND VERY SENSITIVE TO LIGHT WHEN FLARE UP OF IRITIS  . History of chickenpox 09/26/2014  . History of measles   . History of shingles   . History of MRSA infection   . Medicare annual wellness visit, initial 04/07/2015  . Dysrhythmia     HX OF FAST HEART RATE AND PALPITATIONS - METOPROLOL HAS HELPED  . Wears glasses   . Spinal headache     spinal headache with epidural  . Heart murmur     "benign; 06/2015"  . Type II diabetes mellitus (HCC)     ORAL MEDICATION - NO INSULIN  . History of blood transfusion     "when I had my partial hysterectomy"  . Arthritis     "both knees" (07/02/2015)  . Primary localized osteoarthritis of right knee   . Cancer of right breast [facility])     Past Surgical History  Procedure Laterality Date  . Total thyroidectomy  1976    removed three tumor (2 on right; 1 on left)  . Plantar fascia surgery Left   . Abdominoplasty  1986  . Hernia repair    . Anterior and posterior repair  05/11/2012    Procedure: ANTERIOR  (CYSTOCELE) AND POSTERIOR REPAIR (RECTOCELE);  Surgeon: Reece Packer, MD;  [facility];  Service: Urology;;  with graft  . Vaginal prolapse repair  05/11/2012    Procedure: VAGINAL VAULT SUSPENSION;  Surgeon: Reece Packer, MD;  [facility];  Service: Urology;;  . Cystoscopy with urethral dilatation  05/11/2012    Procedure: CYSTOSCOPY WITH URETHRAL DILATATION;  Surgeon: Reece Packer, MD;  [facility];  Service: Urology;;  . Thyroidectomy N/A 02/23/2014    Procedure: TOTAL THYROIDECTOMY;  Surgeon: Armandina Gemma, MD;  [facility];  Service: General;  Laterality: N/A;  . Vulva / perineum biopsy      vulvar  . Mastectomy Right 2007  . Colonoscopy    . Esophagogastroduodenoscopy    . Total knee arthroplasty Right 07/02/2015  . Umbilical hernia repair  1986  . Breast biopsy Right   . Abdominal hysterectomy  1988  . Tubal ligation    . Cardiac catheterization  1999  . Total knee arthroplasty Right 07/02/2015    Procedure: TOTAL KNEE ARTHROPLASTY;  Surgeon: Elsie Saas, MD;  Location: [place];  Service: Orthopedics;  Laterality: Right;    There were no vitals filed  for this visit.      Subjective Assessment - 09/12/15 1438    Subjective Pt reports that she has had pain and tightness due to the weather   Currently in Pain? Yes   Pain Score 3    Pain Location Knee   Pain Orientation Right                         [facility] Adult PT Treatment/Exercise - 09/12/15 0001    Ambulation/Gait   Stairs Yes   Stairs Assistance 5: Supervision   Stair Management Technique One rail Right;Alternating pattern   Number of Stairs 24   Height of Stairs 6   Gait Comments X2   Knee/Hip Exercises: Aerobic   Elliptical I10 R5 x 6 min    Nustep level 5 x 6 minutes   Knee/Hip Exercises: Machines for Strengthening   Cybex Leg Press #50 2x10; RLE only #30 2x5   Knee/Hip Exercises: Standing   Other Standing Knee Exercises Spord cord walking 4 way #30 x3    Other  Standing Knee Exercises Sit to stand #10 2x15; heel raises on black bar 2x20   Modalities   Modalities Vasopneumatic   Vasopneumatic   Number Minutes Vasopneumatic  15 minutes   Vasopnuematic Location  Knee   Vasopneumatic Pressure High   Vasopneumatic Temperature  34                  PT Short Term Goals - 07/26/15 1435    PT SHORT TERM GOAL #1   Title independent with initial HEP   Status Achieved           PT Long Term Goals - 09/05/15 1152    PT LONG TERM GOAL #1   Title decrease pain 50%   PT LONG TERM GOAL #3   Title walk witout assistive device   PT LONG TERM GOAL #4   Title goe up and down steps step over step   Status Partially Met               Plan - 09/12/15 1516    Clinical Impression Statement Continues to perform well with resisted walking. Pt still hesitant with stair negotiation when controlling descents with RLE. Completed additional volume with sit to stands.   Rehab Potential Good   PT Frequency 3x / week   PT Duration 8 weeks   PT Treatment/Interventions ADLs/Self Care Home Management;Cryotherapy;Electrical Stimulation;Therapeutic exercise;Therapeutic activities;Functional mobility training;Stair training;Gait training;Balance training;Neuromuscular re-education;Patient/family education;Manual techniques;Passive range of motion   PT Next Visit Plan functional training.      Patient will benefit from skilled therapeutic intervention in order to improve the following deficits and impairments:  Abnormal gait, Decreased mobility, Decreased range of motion, Decreased scar mobility, Decreased strength, Increased edema, Difficulty walking, Increased muscle spasms, Impaired flexibility, Pain  Visit Diagnosis: Stiffness of right knee, not elsewhere classified  Localized swelling, mass and lump, right lower limb  Pain in right knee  Difficulty in walking, not elsewhere classified     Problem List Patient Active Problem List    Diagnosis Date Noted  . Primary localized osteoarthritis of right knee   . History of MRSA infection   . History of measles   . Dysrhythmia   . Chronic rhinitis 06/06/2015  . Shortness of breath 06/06/2015  . Vaginitis and vulvovaginitis 04/25/2015  . Lumbar back pain 04/25/2015  . Medicare annual wellness visit, initial 04/07/2015  . Allergic response 01/01/2015  . History  of chickenpox 09/26/2014  . History of shingles   . Diabetes mellitus without complication ([place]) 03/70/9643  . 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 Deformity of metatarsal bone of left foot 03/03/2014  . Vulvar irritation  S/P neg vulvar bx 12/2013 01/23/2014  . 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 Right knee DJD 07/12/2013  . Neuralgia of left foot 06/24/2013  . Tarsal tunnel syndrome 04/08/2013  . Vaginal dryness, menopausal 04/05/2013  . Pronation deformity of ankle, acquired 02/09/2013  . Cortical cataract 02/01/2013  . H/O bone density study 01/26/2013  . Chest pain 01/05/2013  . History of iritis 01/04/2013  . Dry eye syndrome 10/07/2012  . Meibomian gland disease 10/07/2012  . GERD (gastroesophageal reflux disease) 10/05/2012  . Essential hypertension, benign 10/04/2012  . Hyperlipidemia, mixed 10/04/2012  . 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 Diabetic neuropathy ([place]) 10/04/2012  . S/P hysterectomy 10/04/2012  . Osteopenia 10/04/2012  . Hypothyroidism 10/04/2012  . History of cystocele 10/04/2012  . Dyspareunia in female 10/04/2012  . Plantar fasciitis 10/04/2012  . Nuclear sclerotic cataract 06/17/2012  . Diabetes mellitus ([place]) 03/02/2012  . BP (high blood pressure) 01/06/2012  . Iritis 01/06/2012  . Neuropathy ([place]) 01/06/2012  . Disease of thyroid gland 01/06/2012  . Breast cancer ([place]) 05/06/2011    Scot Jun, PTA  09/12/2015, 3:18 PM  [place], [place], [address] Phone: [phone]   Fax:  [phone]  Name: Jleigh Striplin MRN: [id number] Date of Birth: 10/23/1950

## 2015-09-13 ENCOUNTER — Ambulatory Visit (INDEPENDENT_AMBULATORY_CARE_PROVIDER_SITE_OTHER): Payer: Medicare HMO | Admitting: Family Medicine

## 2015-09-13 ENCOUNTER — Encounter: Payer: Self-pay | Admitting: Family Medicine

## 2015-09-13 VITALS — BP 124/72 | HR 77 | Temp 97.7°F | Ht 65.0 in | Wt 170.0 lb

## 2015-09-13 DIAGNOSIS — M545 Low back pain: Secondary | ICD-10-CM | POA: Diagnosis not present

## 2015-09-13 DIAGNOSIS — R82998 Other abnormal findings in urine: Secondary | ICD-10-CM

## 2015-09-13 DIAGNOSIS — N390 Urinary tract infection, site not specified: Secondary | ICD-10-CM

## 2015-09-13 LAB — POC URINALSYSI DIPSTICK (AUTOMATED)
Bilirubin, UA: NEGATIVE
Blood, UA: NEGATIVE
Glucose, UA: NEGATIVE
Ketones, UA: NEGATIVE
Nitrite, UA: NEGATIVE
Protein, UA: NEGATIVE
Spec Grav, UA: 1.02
Urobilinogen, UA: NEGATIVE
pH, UA: 6.5

## 2015-09-13 MED ORDER — CIPROFLOXACIN HCL 250 MG PO TABS: 250.0000 mg | ORAL_TABLET | Freq: Two times a day (BID) | ORAL | Status: DC

## 2015-09-13 MED ORDER — FLUCONAZOLE 150 MG PO TABS: 150.0000 mg | ORAL_TABLET | Freq: Once | ORAL | Status: DC

## 2015-09-13 NOTE — Progress Notes (Signed)
Pre visit review using our clinic review tool, if applicable. No additional management support is needed unless otherwise documented below in the visit note. 

## 2015-09-13 NOTE — Patient Instructions (Addendum)

## 2015-09-13 NOTE — Progress Notes (Signed)
Subjective:    Victoria Walker is a 65 y.o. female who presents for follow up of low back problems. Current symptoms include: pain in low back (aching in character; 6/10 in severity). Symptoms have significantly worsened from the previous visit. Exacerbating factors identified by the patient are sitting, standing and walking.  The following portions of the patient's history were reviewed and updated as appropriate:  She  has a past medical history of Hypertension; Neuropathy ([place]); High cholesterol; Hypothyroidism; GERD (gastroesophageal reflux disease); Multinodular thyroid; H/O hiatal hernia; Claustrophobia; H/O iritis; History of chickenpox (09/26/2014); History of measles; History of shingles; History of MRSA infection; Medicare annual wellness visit, initial (04/07/2015); Dysrhythmia; Wears glasses; Spinal headache; Heart murmur; Type II diabetes mellitus ([place]); History of blood transfusion; Arthritis; Primary localized osteoarthritis of right knee; and Cancer of right breast ([place]). She  does not have any pertinent problems on file. She  has past surgical history that includes Total thyroidectomy (1976); Plantar fascia surgery (Left); Abdominoplasty (1986); Hernia repair; Anterior and posterior repair (05/11/2012); Vaginal prolapse repair (05/11/2012); Cystoscopy with urethral dilatation (05/11/2012); Thyroidectomy (N/A, 02/23/2014); Vulva / perineum biopsy; Mastectomy (Right, 2007); Colonoscopy; Esophagogastroduodenoscopy; Total knee arthroplasty (Right, 07/02/2015); Umbilical hernia repair (1986); Breast biopsy (Right); Abdominal hysterectomy (1988); Tubal ligation; Cardiac catheterization (1999); and Total knee arthroplasty (Right, 07/02/2015). Her family history includes Alcohol abuse in her father; Arthritis in her sister and son; Benign prostatic hyperplasia in her brother; Cancer in her father, paternal aunt, sister, and sister; Diabetes in her brother, daughter, father, sister, and sister; Heart  disease in her maternal grandmother and mother; Hyperlipidemia in her brother, brother, brother, daughter, mother, and sister; Hypertension in her brother, brother, brother, daughter, daughter, father, maternal aunt, maternal grandfather, maternal grandmother, maternal uncle, mother, paternal aunt, paternal uncle, sister, sister, and sister; Kidney Stones in her mother; Kidney disease in her brother and mother; Stroke in her brother and maternal grandmother; Thyroid disease in her sister and sister. She  reports that she has never smoked. She has never used smokeless tobacco. She reports that she does not drink alcohol or use illicit drugs. She has a current medication list which includes the following prescription(s): aspirin, atorvastatin, avapro, calcium-vitamin d, vitamin d, ciclopirox, cyclosporine, diltiazem, docusate sodium, esomeprazole, gabapentin, glucose blood, hydromorphone, levocetirizine, levothyroxine, loteprednol, metformin, metoprolol succinate, mometasone, polyethylene glycol, polyvinyl alcohol-povidone, probiotic product, sodium chloride, and spironolactone. Current Outpatient Prescriptions on File Prior to Visit  Medication Sig Dispense Refill  . atorvastatin (LIPITOR) 20 MG tablet Take 1-2 tablets (20-40 mg total) by mouth daily at 6 PM. Take 1 tablet daily and on Saturday and Sunday take 2 tablets. 105 tablet 2  . AVAPRO 150 MG tablet Take 0.5-1 tablets (75-150 mg total) by mouth at bedtime. 30 tablet 5  . calcium-vitamin D (OSCAL WITH D) 250-125 MG-UNIT per tablet Take 1 tablet by mouth daily.     . Cholecalciferol (VITAMIN D) 2000 UNITS CAPS Take 1 capsule by mouth every evening.     . Ciclopirox 1 % shampoo Apply 1 each topically every Friday.     . cycloSPORINE (RESTASIS) 0.05 % ophthalmic emulsion Place 1 drop into both eyes 2 (two) times daily.    Marland Kitchen diltiazem (TIAZAC) 360 MG 24 hr capsule TAKE 1 CAPSULE (360 MG TOTAL) BY MOUTH EVERY MORNING. 90 capsule 3  . docusate sodium  (COLACE) 100 MG capsule 1 tab 2 times a day while on narcotics.  STOOL SOFTENER 60 capsule 0  . esomeprazole (NEXIUM) 40 MG capsule Take 1 capsule (40  mg total) by mouth every morning. 90 capsule 1  . gabapentin (NEURONTIN) 300 MG capsule Take 1 capsule (300 mg total) by mouth at bedtime. 270 capsule 1  . glucose blood (ONE TOUCH TEST STRIPS) test strip Use as directed twice daily to check blood sugar. DX code E11.9 300 each 1  . HYDROmorphone (DILAUDID) 2 MG tablet 1-2 tablets every 4-6 hrs as needed for pain 100 tablet 0  . levocetirizine (XYZAL) 5 MG tablet Take 1 tablet (5 mg total) by mouth every evening. 90 tablet 0  . levothyroxine (SYNTHROID, LEVOTHROID) 75 MCG tablet Take 0.5-1 tablets (37.5-75 mcg total) by mouth daily before breakfast. Take one tablet Monday through Friday and one half tablet Saturday and Sunday 90 tablet 1  . loteprednol (LOTEMAX) 0.5 % ophthalmic suspension Place 1 drop into the left eye daily as needed ("arthiritis of the eye").     . metFORMIN (GLUCOPHAGE-XR) 750 MG 24 hr tablet TAKE 1 TABLET IN AM AND 1 TABLET AT supper 180 tablet 1  . metoprolol succinate (TOPROL-XL) 25 MG 24 hr tablet Take 25 mg by mouth 2 (two) times daily.     . mometasone (NASONEX) 50 MCG/ACT nasal spray Place 1-2 sprays into the nose daily as needed (for congestion). 17 g 5  . polyethylene glycol (MIRALAX / GLYCOLAX) packet 17grams in 16 oz of water twice a day until bowel movement.  LAXITIVE.  Restart if two days since last bowel movement 14 each 0  . polyvinyl alcohol-povidone (REFRESH) 1.4-0.6 % ophthalmic solution Place 1 drop into both eyes 2 (two) times daily.    . Probiotic Product (PROBIOTIC DAILY PO) Take 1 capsule by mouth daily.    . sodium chloride (OCEAN) 0.65 % SOLN nasal spray Place 1 spray into both nostrils daily as needed for congestion.    Marland Kitchen spironolactone (ALDACTONE) 25 MG tablet TAKE 1.5 TABLET (37.5) BY MOUTH daily 180 tablet 1   No current facility-administered  medications on file prior to visit.   She is allergic to codeine; penicillins; sulfa antibiotics; and vicodin..    Objective:    BP 124/72 mmHg  Pulse 77  Temp(Src) 97.7 F (36.5 C) (Oral)  Ht 5\' 5"  (1.651 m)  Wt 170 lb (77.111 kg)  BMI 28.29 kg/m2  SpO2 98% General appearance: alert, cooperative, appears stated age and no distress Extremities: extremities normal, atraumatic, no cyanosis or edema Neurologic: Alert and oriented X 3, normal strength and tone. Normal symmetric reflexes. Normal coordination and gait    Assessment:    Nonspecific acute low back pain    Plan:    Natural history and expected course discussed. Questions answered. Short (2-4 day) period of relative rest recommended until acute symptoms improve. Ice to affected area as needed for local pain relief. Heat to affected area as needed for local pain relief. Muscle relaxants per medication orders.

## 2015-09-14 ENCOUNTER — Ambulatory Visit: Payer: Medicare HMO | Admitting: Physical Therapy

## 2015-09-14 ENCOUNTER — Encounter: Payer: Self-pay | Admitting: Physical Therapy

## 2015-09-14 DIAGNOSIS — M25661 Stiffness of right knee, not elsewhere classified: Secondary | ICD-10-CM | POA: Diagnosis not present

## 2015-09-14 DIAGNOSIS — R2241 Localized swelling, mass and lump, right lower limb: Secondary | ICD-10-CM

## 2015-09-14 DIAGNOSIS — R262 Difficulty in walking, not elsewhere classified: Secondary | ICD-10-CM

## 2015-09-14 DIAGNOSIS — M25561 Pain in right knee: Secondary | ICD-10-CM

## 2015-09-14 NOTE — Therapy (Signed)
[facility] [place], [place], [address] Phone: [phone]   Fax:  [phone]  Physical Therapy Treatment  Patient Details  Name: Victoria Walker MRN: [id number] Date of Birth: 09-Feb-1951 Referring Provider: Noemi Chapel  Encounter Date: 09/14/2015      PT End of Session - 09/14/15 1150    Visit Number 23   Date for PT Re-Evaluation 09/22/15   PT Start Time 1100   PT Stop Time 1200   PT Time Calculation (min) 60 min   Activity Tolerance Patient tolerated treatment well   Behavior During Therapy [facility] for tasks assessed/performed      Past Medical History  Diagnosis Date  . Hypertension   . Neuropathy ([place])     FEET  . High cholesterol   . Hypothyroidism   . GERD (gastroesophageal reflux disease)   . Multinodular thyroid     PT HAVING TROUBLE SWALLOWING  . H/O hiatal hernia   . Claustrophobia   . H/O iritis     LEFT EYE - STATES HER EYE BECOMES RED AND VERY SENSITIVE TO LIGHT WHEN FLARE UP OF IRITIS  . History of chickenpox 09/26/2014  . History of measles   . History of shingles   . History of MRSA infection   . Medicare annual wellness visit, initial 04/07/2015  . Dysrhythmia     HX OF FAST HEART RATE AND PALPITATIONS - METOPROLOL HAS HELPED  . Wears glasses   . Spinal headache     spinal headache with epidural  . Heart murmur     "benign; 06/2015"  . Type II diabetes mellitus ([facility])     ORAL MEDICATION - NO INSULIN  . History of blood transfusion     "when I had my partial hysterectomy"  . Arthritis     "both knees" (07/02/2015)  . Primary localized osteoarthritis of right knee   . Cancer of right breast [facility])     Past Surgical History  Procedure Laterality Date  . Total thyroidectomy  1976    removed three tumor (2 on right; 1 on left)  . Plantar fascia surgery Left   . Abdominoplasty  1986  . Hernia repair    . Anterior and posterior repair  05/11/2012    Procedure: ANTERIOR  (CYSTOCELE) AND POSTERIOR REPAIR (RECTOCELE);  Surgeon: Reece Packer, MD;  [facility]: [facility];  Service: Urology;;  with graft  . Vaginal prolapse repair  05/11/2012    Procedure: VAGINAL VAULT SUSPENSION;  Surgeon: Reece Packer, MD;  [facility];  Service: Urology;;  . Cystoscopy with urethral dilatation  05/11/2012    Procedure: CYSTOSCOPY WITH URETHRAL DILATATION;  Surgeon: Reece Packer, MD;  [facility];  Service: Urology;;  . Thyroidectomy N/A 02/23/2014    Procedure: TOTAL THYROIDECTOMY;  Surgeon: Armandina Gemma, MD;  [facility];  Service: General;  Laterality: N/A;  . Vulva / perineum biopsy      vulvar  . Mastectomy Right 2007  . Colonoscopy    . Esophagogastroduodenoscopy    . Total knee arthroplasty Right 07/02/2015  . Umbilical hernia repair  1986  . Breast biopsy Right   . Abdominal hysterectomy  1988  . Tubal ligation    . Cardiac catheterization  1999  . Total knee arthroplasty Right 07/02/2015    Procedure: TOTAL KNEE ARTHROPLASTY;  Surgeon: Elsie Saas, MD;  Location: [place];  Service: Orthopedics;  Laterality: Right;    There were no vitals filed  for this visit.      Subjective Assessment - 09/14/15 1059    Subjective Pt reports that she felt bad yesterday  and had to go to the doctor.    Currently in Pain? No/denies   Pain Score 0-No pain                         [facility] Adult PT Treatment/Exercise - 09/14/15 0001    Ambulation/Gait   Ambulation/Gait Yes   Ambulation/Gait Assistance 6: Modified independent (Device/Increase time);5: Supervision   Ambulation Distance (Feet) 600 Feet   Assistive device None   Gait Pattern Step-through pattern  mild limp, weakness with eccentric contraction   Ambulation Surface Unlevel;Outdoor;Paved   Stairs Yes   Stairs Assistance 5: Supervision   Stair Management Technique One rail Right;Alternating pattern   Height of Stairs 6   High Level Balance   High Level Balance Comments  standing march on airex   Knee/Hip Exercises: Aerobic   Elliptical I10 R5 x 6 min    Recumbent Bike L0 x2 min Seat 5   Knee/Hip Exercises: Standing   Forward Step Up Both;1 set;10 reps;Step Height: 8"   Other Standing Knee Exercises [place] walking 4 way #30 x3; Standing march #3 2x10    Other Standing Knee Exercises Sit to stand #10 2x10   Modalities   Modalities Vasopneumatic   Vasopneumatic   Number Minutes Vasopneumatic  15 minutes   Vasopnuematic Location  Knee   Vasopneumatic Pressure High   Vasopneumatic Temperature  34                  PT Short Term Goals - 07/26/15 1435    PT SHORT TERM GOAL #1   Title independent with initial HEP   Status Achieved           PT Long Term Goals - 09/05/15 1152    PT LONG TERM GOAL #1   Title decrease pain 50%   PT LONG TERM GOAL #3   Title walk witout assistive device   PT LONG TERM GOAL #4   Title goe up and down steps step over step   Status Partially Met               Plan - 09/14/15 1151    Clinical Impression Statement Pt with some difficulty with eccentric loads such as walking down hill, She remains hesitant when controlling her descends with RLE with stair negotiation.    Rehab Potential Good   PT Frequency 3x / week   PT Duration 8 weeks   PT Treatment/Interventions ADLs/Self Care Home Management;Cryotherapy;Electrical Stimulation;Therapeutic exercise;Therapeutic activities;Functional mobility training;Stair training;Gait training;Balance training;Neuromuscular re-education;Patient/family education;Manual techniques;Passive range of motion   PT Next Visit Plan functional training.      Patient will benefit from skilled therapeutic intervention in order to improve the following deficits and impairments:  Abnormal gait, Decreased mobility, Decreased range of motion, Decreased scar mobility, Decreased strength, Increased edema, Difficulty walking, Increased muscle spasms, Impaired flexibility,  Pain  Visit Diagnosis: Stiffness of right knee, not elsewhere classified  Localized swelling, mass and lump, right lower limb  Pain in right knee  Difficulty in walking, not elsewhere classified     Problem List Patient Active Problem List   Diagnosis Date Noted  . Primary localized osteoarthritis of right knee   . History of MRSA infection   . History of measles   . Dysrhythmia   . Chronic rhinitis 06/06/2015  .  Shortness of breath 06/06/2015  . Vaginitis and vulvovaginitis 04/25/2015  . Lumbar back pain 04/25/2015  . Medicare annual wellness visit, initial 04/07/2015  . Allergic response 01/01/2015  . History of chickenpox 09/26/2014  . History of shingles   . Diabetes mellitus without complication ([place]) 28/02/8866  . 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 Deformity of metatarsal bone of left foot 03/03/2014  . Vulvar irritation  S/P neg vulvar bx 12/2013 01/23/2014  . 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 Right knee DJD 07/12/2013  . Neuralgia of left foot 06/24/2013  . Tarsal tunnel syndrome 04/08/2013  . Vaginal dryness, menopausal 04/05/2013  . Pronation deformity of ankle, acquired 02/09/2013  . Cortical cataract 02/01/2013  . H/O bone density study 01/26/2013  . Chest pain 01/05/2013  . History of iritis 01/04/2013  . Dry eye syndrome 10/07/2012  . Meibomian gland disease 10/07/2012  . GERD (gastroesophageal reflux disease) 10/05/2012  . Essential hypertension, benign 10/04/2012  . Hyperlipidemia, mixed 10/04/2012  . 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 Diabetic neuropathy ([place]) 10/04/2012  . S/P hysterectomy 10/04/2012  . Osteopenia 10/04/2012  . Hypothyroidism 10/04/2012  . History of cystocele 10/04/2012  . Dyspareunia in female 10/04/2012  . Plantar fasciitis 10/04/2012  . Nuclear sclerotic cataract 06/17/2012  . Diabetes mellitus ([place]) 03/02/2012  . BP (high blood pressure) 01/06/2012  . Iritis  01/06/2012  . Neuropathy ([place]) 01/06/2012  . Disease of thyroid gland 01/06/2012  . Breast cancer ([place]) 05/06/2011    Scot Jun, PTA  09/14/2015, 11:55 AM  [place], [place], [address] Phone: [phone]   Fax:  [phone]  Name: Victoria Walker MRN: [id number] Date of Birth: 1951/01/31

## 2015-09-15 LAB — URINE CULTURE: Colony Count: 75000

## 2015-09-19 ENCOUNTER — Encounter: Payer: Self-pay | Admitting: Physical Therapy

## 2015-09-19 ENCOUNTER — Ambulatory Visit: Payer: Medicare HMO | Admitting: Physical Therapy

## 2015-09-19 DIAGNOSIS — M25661 Stiffness of right knee, not elsewhere classified: Secondary | ICD-10-CM | POA: Diagnosis not present

## 2015-09-19 DIAGNOSIS — M25561 Pain in right knee: Secondary | ICD-10-CM

## 2015-09-19 DIAGNOSIS — R2241 Localized swelling, mass and lump, right lower limb: Secondary | ICD-10-CM

## 2015-09-19 DIAGNOSIS — R262 Difficulty in walking, not elsewhere classified: Secondary | ICD-10-CM

## 2015-09-19 NOTE — Therapy (Signed)
[place], [place], [address] Phone: [phone]   Fax:  [phone]  Physical Therapy Treatment  Patient Details  Name: Victoria Walker MRN: [id number] Date of Birth: 1950-04-25 Referring Provider: Noemi Chapel  Encounter Date: 09/19/2015      PT End of Session - 09/19/15 1427    Visit Number 24   Date for PT Re-Evaluation 09/22/15   PT Start Time 1340   PT Stop Time 1445   PT Time Calculation (min) 65 min   Activity Tolerance Patient tolerated treatment well   Behavior During Therapy [facility] for tasks assessed/performed      Past Medical History  Diagnosis Date  . Hypertension   . Neuropathy ([place])     FEET  . High cholesterol   . Hypothyroidism   . GERD (gastroesophageal reflux disease)   . Multinodular thyroid     PT HAVING TROUBLE SWALLOWING  . H/O hiatal hernia   . Claustrophobia   . H/O iritis     LEFT EYE - STATES HER EYE BECOMES RED AND VERY SENSITIVE TO LIGHT WHEN FLARE UP OF IRITIS  . History of chickenpox 09/26/2014  . History of measles   . History of shingles   . History of MRSA infection   . Medicare annual wellness visit, initial 04/07/2015  . Dysrhythmia     HX OF FAST HEART RATE AND PALPITATIONS - METOPROLOL HAS HELPED  . Wears glasses   . Spinal headache     spinal headache with epidural  . Heart murmur     "benign; 06/2015"  . Type II diabetes mellitus ([facility])     ORAL MEDICATION - NO INSULIN  . History of blood transfusion     "when I had my partial hysterectomy"  . Arthritis     "both knees" (07/02/2015)  . Primary localized osteoarthritis of right knee   . Cancer of right breast [facility])     Past Surgical History  Procedure Laterality Date  . Total thyroidectomy  1976    removed three tumor (2 on right; 1 on left)  . Plantar fascia surgery Left   . Abdominoplasty  1986  . Hernia repair    . Anterior and posterior repair  05/11/2012    Procedure: ANTERIOR  (CYSTOCELE) AND POSTERIOR REPAIR (RECTOCELE);  Surgeon: Reece Packer, MD;  [facility];  Service: Urology;;  with graft  . Vaginal prolapse repair  05/11/2012    Procedure: VAGINAL VAULT SUSPENSION;  Surgeon: Reece Packer, MD;  [facility];  Service: Urology;;  . Cystoscopy with urethral dilatation  05/11/2012    Procedure: CYSTOSCOPY WITH URETHRAL DILATATION;  Surgeon: Reece Packer, MD;  [facility];  Service: Urology;;  . Thyroidectomy N/A 02/23/2014    Procedure: TOTAL THYROIDECTOMY;  Surgeon: Armandina Gemma, MD;  [facility];  Service: General;  Laterality: N/A;  . Vulva / perineum biopsy      vulvar  . Mastectomy Right 2007  . Colonoscopy    . Esophagogastroduodenoscopy    . Total knee arthroplasty Right 07/02/2015  . Umbilical hernia repair  1986  . Breast biopsy Right   . Abdominal hysterectomy  1988  . Tubal ligation    . Cardiac catheterization  1999  . Total knee arthroplasty Right 07/02/2015    Procedure: TOTAL KNEE ARTHROPLASTY;  Surgeon: Elsie Saas, MD;  Location: [place];  Service: Orthopedics;  Laterality: Right;    There were no vitals filed  for this visit.          [facility] PT Assessment - 09/19/15 0001    AROM   AROM Assessment Site Knee   Right/Left Knee Right   Right Knee Extension 2   Right Knee Flexion 113                     [facility] Adult PT Treatment/Exercise - 09/19/15 0001    Ambulation/Gait   Ambulation/Gait Yes   Ambulation/Gait Assistance 6: Modified independent (Device/Increase time);5: Supervision   Ambulation Distance (Feet) 600 Feet   Assistive device None   Gait Pattern Step-through pattern   Ambulation Surface Unlevel;Outdoor   Stairs Yes   Stairs Assistance 5: Supervision   Stair Management Technique One rail Right;Alternating pattern   Number of Stairs 24   Height of Stairs 6   Gait Comments 2 flights of sairs, hesitant with eccentric loads of R quad   Knee/Hip Exercises: Aerobic    Elliptical I10 R5 x 6 min    Recumbent Bike L0 x2 min Seat 5   Knee/Hip Exercises: Machines for Strengthening   Cybex Knee Extension RLE only #10 x15    Cybex Knee Flexion RLE only #15 x15    Cybex Leg Press #50 2x10; RLE only #30 2x5   Knee/Hip Exercises: Standing   Lateral Step Up 1 set;Right;Hand Hold: 0;10 reps;Step Height: 8"   Forward Step Up Both;1 set;10 reps;Step Height: 8"   Modalities   Modalities Vasopneumatic   Vasopneumatic   Number Minutes Vasopneumatic  15 minutes   Vasopnuematic Location  Knee   Vasopneumatic Pressure High   Vasopneumatic Temperature  34                  PT Short Term Goals - 07/26/15 1435    PT SHORT TERM GOAL #1   Title independent with initial HEP   Status Achieved           PT Long Term Goals - 09/19/15 1431    PT LONG TERM GOAL #1   Title decrease pain 50%   Status Achieved   PT LONG TERM GOAL #2   Title increase AROM of the right knee to 4-115 degrees flexion   Status Partially Met   PT LONG TERM GOAL #3   Title walk witout assistive device   Status Achieved   PT LONG TERM GOAL #4   Title goe up and down steps step over step   Status Achieved   PT LONG TERM GOAL #5   Title walk 600 feet without rest   Status Achieved               Plan - 09/19/15 1430    Clinical Impression Statement Continues to do well and is progressing towards goals. D/C next visit.   Rehab Potential Good   PT Frequency 3x / week   PT Duration 8 weeks   PT Treatment/Interventions ADLs/Self Care Home Management;Cryotherapy;Electrical Stimulation;Therapeutic exercise;Therapeutic activities;Functional mobility training;Stair training;Gait training;Balance training;Neuromuscular re-education;Patient/family education;Manual techniques;Passive range of motion   PT Next Visit Plan D/C PT      Patient will benefit from skilled therapeutic intervention in order to improve the following deficits and impairments:  Abnormal gait, Decreased  mobility, Decreased range of motion, Decreased scar mobility, Decreased strength, Increased edema, Difficulty walking, Increased muscle spasms, Impaired flexibility, Pain  Visit Diagnosis: Stiffness of right knee, not elsewhere classified  Localized swelling, mass and lump, right lower limb  Pain in right knee  Difficulty in walking, not elsewhere classified     Problem List Patient Active Problem List   Diagnosis Date Noted  . Primary localized osteoarthritis of right knee   . History of MRSA infection   . History of measles   . Dysrhythmia   . Chronic rhinitis 06/06/2015  . Shortness of breath 06/06/2015  . Vaginitis and vulvovaginitis 04/25/2015  . Lumbar back pain 04/25/2015  . Medicare annual wellness visit, initial 04/07/2015  . Allergic response 01/01/2015  . History of chickenpox 09/26/2014  . History of shingles   . Diabetes mellitus without complication ([place]) 68/11/8108  . 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 Deformity of metatarsal bone of left foot 03/03/2014  . Vulvar irritation  S/P neg vulvar bx 12/2013 01/23/2014  . 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 Right knee DJD 07/12/2013  . Neuralgia of left foot 06/24/2013  . Tarsal tunnel syndrome 04/08/2013  . Vaginal dryness, menopausal 04/05/2013  . Pronation deformity of ankle, acquired 02/09/2013  . Cortical cataract 02/01/2013  . H/O bone density study 01/26/2013  . Chest pain 01/05/2013  . History of iritis 01/04/2013  . Dry eye syndrome 10/07/2012  . Meibomian gland disease 10/07/2012  . GERD (gastroesophageal reflux disease) 10/05/2012  . Essential hypertension, benign 10/04/2012  . Hyperlipidemia, mixed 10/04/2012  . 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 Diabetic neuropathy ([place]) 10/04/2012  . S/P hysterectomy 10/04/2012  . Osteopenia 10/04/2012  . Hypothyroidism 10/04/2012  . History of cystocele 10/04/2012  . Dyspareunia in female 10/04/2012   . Plantar fasciitis 10/04/2012  . Nuclear sclerotic cataract 06/17/2012  . Diabetes mellitus ([place]) 03/02/2012  . BP (high blood pressure) 01/06/2012  . Iritis 01/06/2012  . Neuropathy ([place]) 01/06/2012  . Disease of thyroid gland 01/06/2012  . Breast cancer ([place]) 05/06/2011    Scot Jun, PTA  09/19/2015, 2:32 PM  [place], [place], [address] Phone: [phone]   Fax:  [phone]  Name: Aubrionna Istre MRN: [id number] Date of Birth: 12-30-1950

## 2015-09-21 ENCOUNTER — Ambulatory Visit: Payer: Medicare HMO | Admitting: Physical Therapy

## 2015-09-21 ENCOUNTER — Encounter: Payer: Self-pay | Admitting: Physical Therapy

## 2015-09-21 ENCOUNTER — Ambulatory Visit: Payer: Medicare HMO | Attending: Orthopedic Surgery | Admitting: Physical Therapy

## 2015-09-21 DIAGNOSIS — M25661 Stiffness of right knee, not elsewhere classified: Secondary | ICD-10-CM | POA: Diagnosis not present

## 2015-09-21 DIAGNOSIS — R2241 Localized swelling, mass and lump, right lower limb: Secondary | ICD-10-CM | POA: Diagnosis not present

## 2015-09-21 DIAGNOSIS — M25561 Pain in right knee: Secondary | ICD-10-CM | POA: Insufficient documentation

## 2015-09-21 DIAGNOSIS — R262 Difficulty in walking, not elsewhere classified: Secondary | ICD-10-CM | POA: Diagnosis not present

## 2015-09-21 NOTE — Telephone Encounter (Signed)
Erroneous Encounter

## 2015-09-21 NOTE — Therapy (Signed)
[facility] [place], [place], [address] Phone: [phone]   Fax:  [phone]  Physical Therapy Treatment  Patient Details  Name: Victoria Walker MRN: [id number] Date of Birth: Dec 01, 1950 Referring Provider: Noemi Chapel  Encounter Date: 09/21/2015      PT End of Session - 09/21/15 0934    Visit Number 25   PT Start Time 0844   PT Stop Time 0947   PT Time Calculation (min) 63 min   Activity Tolerance Patient tolerated treatment well   Behavior During Therapy [facility] for tasks assessed/performed      Past Medical History  Diagnosis Date  . Hypertension   . Neuropathy ([place])     FEET  . High cholesterol   . Hypothyroidism   . GERD (gastroesophageal reflux disease)   . Multinodular thyroid     PT HAVING TROUBLE SWALLOWING  . H/O hiatal hernia   . Claustrophobia   . H/O iritis     LEFT EYE - STATES HER EYE BECOMES RED AND VERY SENSITIVE TO LIGHT WHEN FLARE UP OF IRITIS  . History of chickenpox 09/26/2014  . History of measles   . History of shingles   . History of MRSA infection   . Medicare annual wellness visit, initial 04/07/2015  . Dysrhythmia     HX OF FAST HEART RATE AND PALPITATIONS - METOPROLOL HAS HELPED  . Wears glasses   . Spinal headache     spinal headache with epidural  . Heart murmur     "benign; 06/2015"  . Type II diabetes mellitus (HCC)     ORAL MEDICATION - NO INSULIN  . History of blood transfusion     "when I had my partial hysterectomy"  . Arthritis     "both knees" (07/02/2015)  . Primary localized osteoarthritis of right knee   . Cancer of right breast [facility])     Past Surgical History  Procedure Laterality Date  . Total thyroidectomy  1976    removed three tumor (2 on right; 1 on left)  . Plantar fascia surgery Left   . Abdominoplasty  1986  . Hernia repair    . Anterior and posterior repair  05/11/2012    Procedure: ANTERIOR (CYSTOCELE) AND POSTERIOR REPAIR (RECTOCELE);   Surgeon: Reece Packer, MD;  [facility];  Service: Urology;;  with graft  . Vaginal prolapse repair  05/11/2012    Procedure: VAGINAL VAULT SUSPENSION;  Surgeon: Reece Packer, MD;  [facility];  Service: Urology;;  . Cystoscopy with urethral dilatation  05/11/2012    Procedure: CYSTOSCOPY WITH URETHRAL DILATATION;  Surgeon: Reece Packer, MD;  [facility];  Service: Urology;;  . Thyroidectomy N/A 02/23/2014    Procedure: TOTAL THYROIDECTOMY;  Surgeon: Armandina Gemma, MD;  [facility];  Service: General;  Laterality: N/A;  . Vulva / perineum biopsy      vulvar  . Mastectomy Right 2007  . Colonoscopy    . Esophagogastroduodenoscopy    . Total knee arthroplasty Right 07/02/2015  . Umbilical hernia repair  1986  . Breast biopsy Right   . Abdominal hysterectomy  1988  . Tubal ligation    . Cardiac catheterization  1999  . Total knee arthroplasty Right 07/02/2015    Procedure: TOTAL KNEE ARTHROPLASTY;  Surgeon: Elsie Saas, MD;  Location: [place];  Service: Orthopedics;  Laterality: Right;    There were no vitals filed for this visit.  Subjective Assessment - 09/21/15 0843    Subjective "Im good"   Currently in Pain? Yes   Pain Score 4    Pain Location Knee   Pain Orientation Right            [facility] PT Assessment - 09/21/15 0001    AROM   AROM Assessment Site Knee   Right/Left Knee Right   Right Knee Extension 3   Right Knee Flexion 117                     [facility] Adult PT Treatment/Exercise - 09/21/15 0001    Knee/Hip Exercises: Aerobic   Elliptical I10 R5 x 4 min    Recumbent Bike L0 x 4 min Seat 5   Knee/Hip Exercises: Machines for Strengthening   Cybex Knee Extension RLE only #10  2x10    Cybex Knee Flexion RLE only #15 2x10    Cybex Leg Press #50 2x15; RLE only #30 x10   Knee/Hip Exercises: Standing   Other Standing Knee Exercises Spord cor d walking 4 way #30 x3; Standing march #3 x10    Other Standing Knee Exercises  Sit to stand #10 2x10   Modalities   Modalities Vasopneumatic   Vasopneumatic   Number Minutes Vasopneumatic  15 minutes   Vasopnuematic Location  Knee   Vasopneumatic Pressure High   Vasopneumatic Temperature  34                  PT Short Term Goals - 07/26/15 1435    PT SHORT TERM GOAL #1   Title independent with initial HEP   Status Achieved           PT Long Term Goals - 09/21/15 0921    PT LONG TERM GOAL #2   Title increase AROM of the right knee to 4-115 degrees flexion   Status Achieved               Plan - 09/21/15 0934    Clinical Impression Statement Pt has progressed and completed all goals.   Rehab Potential Good   PT Frequency 3x / week   PT Duration 8 weeks   PT Treatment/Interventions ADLs/Self Care Home Management;Cryotherapy;Electrical Stimulation;Therapeutic exercise;Therapeutic activities;Functional mobility training;Stair training;Gait training;Balance training;Neuromuscular re-education;Patient/family education;Manual techniques;Passive range of motion   PT Next Visit Plan D/C PT      Patient will benefit from skilled therapeutic intervention in order to improve the following deficits and impairments:  Abnormal gait, Decreased mobility, Decreased range of motion, Decreased scar mobility, Decreased strength, Increased edema, Difficulty walking, Increased muscle spasms, Impaired flexibility, Pain  Visit Diagnosis: Stiffness of right knee, not elsewhere classified  Localized swelling, mass and lump, right lower limb  Pain in right knee  Difficulty in walking, not elsewhere classified     Problem List Patient Active Problem List   Diagnosis Date Noted  . Primary localized osteoarthritis of right knee   . History of MRSA infection   . History of measles   . Dysrhythmia   . Chronic rhinitis 06/06/2015  . Shortness of breath 06/06/2015  . Vaginitis and vulvovaginitis 04/25/2015  . Lumbar back pain 04/25/2015  . Medicare  annual wellness visit, initial 04/07/2015  . Allergic response 01/01/2015  . History of chickenpox 09/26/2014  . History of shingles   . Diabetes mellitus without complication ([place]) 51/05/5850  . 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 Deformity of metatarsal bone of left  foot 03/03/2014  . Vulvar irritation  S/P neg vulvar bx 12/2013 01/23/2014  . 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 Right knee DJD 07/12/2013  . Neuralgia of left foot 06/24/2013  . Tarsal tunnel syndrome 04/08/2013  . Vaginal dryness, menopausal 04/05/2013  . Pronation deformity of ankle, acquired 02/09/2013  . Cortical cataract 02/01/2013  . H/O bone density study 01/26/2013  . Chest pain 01/05/2013  . History of iritis 01/04/2013  . Dry eye syndrome 10/07/2012  . Meibomian gland disease 10/07/2012  . GERD (gastroesophageal reflux disease) 10/05/2012  . Essential hypertension, benign 10/04/2012  . Hyperlipidemia, mixed 10/04/2012  . 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 Diabetic neuropathy ([place]) 10/04/2012  . S/P hysterectomy 10/04/2012  . Osteopenia 10/04/2012  . Hypothyroidism 10/04/2012  . History of cystocele 10/04/2012  . Dyspareunia in female 10/04/2012  . Plantar fasciitis 10/04/2012  . Nuclear sclerotic cataract 06/17/2012  . Diabetes mellitus ([place]) 03/02/2012  . BP (high blood pressure) 01/06/2012  . Iritis 01/06/2012  . Neuropathy ([place]) 01/06/2012  . Disease of thyroid gland 01/06/2012  . Breast cancer ([place]) 05/06/2011   PHYSICAL THERAPY DISCHARGE SUMMARY  Visits from Start of Care: 25   Plan: Patient agrees to discharge.  Patient goals were met. Patient is being discharged due to meeting the stated rehab goals.  ?????       Scot Jun, PTA  09/21/2015, 9:35 AM  [facility] [address] [place], [place], [address] Phone: [phone]   Fax:  [phone]  Name:  Victoria Walker MRN: [id number] Date of Birth: 05-02-1950

## 2015-09-24 DIAGNOSIS — H40023 Open angle with borderline findings, high risk, bilateral: Secondary | ICD-10-CM | POA: Diagnosis not present

## 2015-09-26 DIAGNOSIS — C50911 Malignant neoplasm of unspecified site of right female breast: Secondary | ICD-10-CM | POA: Diagnosis not present

## 2015-10-08 DIAGNOSIS — Z1231 Encounter for screening mammogram for malignant neoplasm of breast: Secondary | ICD-10-CM | POA: Diagnosis not present

## 2015-10-08 DIAGNOSIS — Z853 Personal history of malignant neoplasm of breast: Secondary | ICD-10-CM | POA: Diagnosis not present

## 2015-10-08 LAB — HM MAMMOGRAPHY

## 2015-10-09 DIAGNOSIS — M1711 Unilateral primary osteoarthritis, right knee: Secondary | ICD-10-CM | POA: Diagnosis not present

## 2015-10-10 ENCOUNTER — Other Ambulatory Visit: Payer: Self-pay | Admitting: Family Medicine

## 2015-10-11 ENCOUNTER — Other Ambulatory Visit: Payer: Self-pay | Admitting: *Deleted

## 2015-10-11 ENCOUNTER — Telehealth: Payer: Self-pay | Admitting: Allergy

## 2015-10-11 ENCOUNTER — Encounter: Payer: Self-pay | Admitting: Family Medicine

## 2015-10-11 NOTE — Telephone Encounter (Signed)
REFUSED LEVOCETIRIZINE, PT NEEDS TO MAKE AN APPT, LAST SEEN 08/2014.

## 2015-10-11 NOTE — Telephone Encounter (Signed)
[facility] called said patient needed refill on nasonex. verbally gave rx. For nasonex.5 refills

## 2015-10-12 ENCOUNTER — Encounter: Payer: Self-pay | Admitting: Podiatry

## 2015-10-12 ENCOUNTER — Other Ambulatory Visit: Payer: Self-pay

## 2015-10-12 ENCOUNTER — Ambulatory Visit (INDEPENDENT_AMBULATORY_CARE_PROVIDER_SITE_OTHER): Payer: Medicare HMO | Admitting: Podiatry

## 2015-10-12 VITALS — BP 114/66 | HR 72

## 2015-10-12 DIAGNOSIS — M216X9 Other acquired deformities of unspecified foot: Secondary | ICD-10-CM

## 2015-10-12 DIAGNOSIS — M21962 Unspecified acquired deformity of left lower leg: Secondary | ICD-10-CM

## 2015-10-12 DIAGNOSIS — M722 Plantar fascial fibromatosis: Secondary | ICD-10-CM | POA: Diagnosis not present

## 2015-10-12 MED ORDER — LEVOCETIRIZINE DIHYDROCHLORIDE 5 MG PO TABS: 5.0000 mg | ORAL_TABLET | Freq: Every evening | ORAL | Status: DC

## 2015-10-12 NOTE — Patient Instructions (Signed)
Seen for bilateral foot pain. Noted of weakened first Metatarsal bone with lateral weight shifting. Both feet casted for Orthotics.

## 2015-10-12 NOTE — Progress Notes (Signed)
Subjective: 65 year old female presents requesting diabetic shoes and orthotics refurbished that was done in 2015.  She was first diagnosed with plantar fasciitis in 2014 and treated with Orthotics.  The existing orthotics are getting old.  Recently she was using treadmill for exercise.   Objective:  Pain under balls and arches of both feet.  Orthopedic findings reveal elevated first ray bilateral with lateral weight shifting.  Previous radiograph examination revealed short and elevated first ray with long 2nd and 3rd metatarsal bones bilateral.   Assessment: STJ Pronation deformity.  Plantar fasciitis bilateral.  Metatarsus primus elevatus bilateral.  Plan: Reviewed clinical findings and available treatment options. Both feet casted for Orthotics.  May benefit from diabetic shoes also.

## 2015-10-12 NOTE — Telephone Encounter (Signed)
Refilled xyzal 5 mg for 90 days with 1 refill.

## 2015-10-13 ENCOUNTER — Other Ambulatory Visit: Payer: Self-pay | Admitting: Family Medicine

## 2015-10-17 ENCOUNTER — Telehealth: Payer: Self-pay | Admitting: *Deleted

## 2015-10-17 NOTE — Telephone Encounter (Signed)
PA approved effective 04/20/2015 through 04/20/2016. Referral number: [id number]. Approval letter sent for scanning. JG//CMA

## 2015-10-17 NOTE — Telephone Encounter (Signed)
PA initiated on covermymeds.com (Key: [id number]), awaiting determination. JG//CMA

## 2015-10-19 DIAGNOSIS — Z9011 Acquired absence of right breast and nipple: Secondary | ICD-10-CM | POA: Diagnosis not present

## 2015-10-22 ENCOUNTER — Telehealth: Payer: Self-pay | Admitting: Family Medicine

## 2015-10-22 NOTE — Telephone Encounter (Signed)
Pt dropped off document to be filled out (Dr. Gibson Ramp Diab[employer] Shoes Form)-Pt would like it to be faxed to [phone]

## 2015-10-24 NOTE — Telephone Encounter (Signed)
Filled out as much as possible and forwarded to Dr. Charlett Blake. JG//CMA

## 2015-10-29 ENCOUNTER — Ambulatory Visit: Payer: Medicare HMO | Admitting: Family Medicine

## 2015-10-30 ENCOUNTER — Encounter: Payer: Self-pay | Admitting: Family Medicine

## 2015-10-30 ENCOUNTER — Ambulatory Visit (INDEPENDENT_AMBULATORY_CARE_PROVIDER_SITE_OTHER): Payer: Medicare HMO | Admitting: Family Medicine

## 2015-10-30 VITALS — BP 120/84 | HR 75 | Temp 98.3°F | Ht 65.0 in | Wt 175.1 lb

## 2015-10-30 DIAGNOSIS — E039 Hypothyroidism, unspecified: Secondary | ICD-10-CM

## 2015-10-30 DIAGNOSIS — K219 Gastro-esophageal reflux disease without esophagitis: Secondary | ICD-10-CM | POA: Diagnosis not present

## 2015-10-30 DIAGNOSIS — E0842 Diabetes mellitus due to underlying condition with diabetic polyneuropathy: Secondary | ICD-10-CM

## 2015-10-30 DIAGNOSIS — M179 Osteoarthritis of knee, unspecified: Secondary | ICD-10-CM

## 2015-10-30 DIAGNOSIS — M8580 Other specified disorders of bone density and structure, unspecified site: Secondary | ICD-10-CM | POA: Diagnosis not present

## 2015-10-30 DIAGNOSIS — E782 Mixed hyperlipidemia: Secondary | ICD-10-CM | POA: Diagnosis not present

## 2015-10-30 DIAGNOSIS — E119 Type 2 diabetes mellitus without complications: Secondary | ICD-10-CM

## 2015-10-30 DIAGNOSIS — M1711 Unilateral primary osteoarthritis, right knee: Secondary | ICD-10-CM

## 2015-10-30 DIAGNOSIS — I10 Essential (primary) hypertension: Secondary | ICD-10-CM

## 2015-10-30 LAB — COMPREHENSIVE METABOLIC PANEL
ALT: 15 U/L (ref 0–35)
AST: 16 U/L (ref 0–37)
Albumin: 4.2 g/dL (ref 3.5–5.2)
Alkaline Phosphatase: 64 U/L (ref 39–117)
BUN: 14 mg/dL (ref 6–23)
CO2: 25 mEq/L (ref 19–32)
Calcium: 9.3 mg/dL (ref 8.4–10.5)
Chloride: 105 mEq/L (ref 96–112)
Creatinine, Ser: 0.96 mg/dL (ref 0.40–1.20)
GFR: 74.8 mL/min (ref >60.00)
Glucose, Bld: 144 mg/dL — ABNORMAL HIGH (ref 70–99)
Potassium: 4.2 mEq/L (ref 3.5–5.1)
Sodium: 139 mEq/L (ref 135–145)
Total Bilirubin: 0.4 mg/dL (ref 0.2–1.2)
Total Protein: 6.8 g/dL (ref 6.0–8.3)

## 2015-10-30 LAB — CBC
HCT: 36.9 % (ref 36.0–46.0)
Hemoglobin: 12 g/dL (ref 12.0–15.0)
MCHC: 32.5 g/dL (ref 30.0–36.0)
MCV: 92.2 fl (ref 78.0–100.0)
Platelets: 256 10*3/uL (ref 150.0–400.0)
RBC: 4 Mil/uL (ref 3.87–5.11)
RDW: 13.9 % (ref 11.5–15.5)
WBC: 6.3 10*3/uL (ref 4.0–10.5)

## 2015-10-30 LAB — LIPID PANEL
Cholesterol: 170 mg/dL (ref 0–200)
HDL: 42.9 mg/dL (ref >39.00)
NonHDL: 126.7
Total CHOL/HDL Ratio: 4
Triglycerides: 220 mg/dL — ABNORMAL HIGH (ref 0.0–149.0)
VLDL: 44 mg/dL — ABNORMAL HIGH (ref 0.0–40.0)

## 2015-10-30 LAB — TSH: TSH: 2.48 u[IU]/mL (ref 0.35–4.50)

## 2015-10-30 LAB — HEMOGLOBIN A1C: Hgb A1c MFr Bld: 6.5 % (ref 4.6–6.5)

## 2015-10-30 LAB — LDL CHOLESTEROL, DIRECT: Direct LDL: 106 mg/dL

## 2015-10-30 MED ORDER — ESOMEPRAZOLE MAGNESIUM 40 MG PO CPDR: 40.0000 mg | DELAYED_RELEASE_CAPSULE | ORAL | Status: DC

## 2015-10-30 MED ORDER — AVAPRO 150 MG PO TABS: ORAL_TABLET | ORAL | Status: DC

## 2015-10-30 NOTE — Progress Notes (Signed)
Pre visit review using our clinic review tool, if applicable. No additional management support is needed unless otherwise documented below in the visit note. 

## 2015-10-30 NOTE — Progress Notes (Signed)
Patient ID: Victoria Walker, female   DOB: 02/01/1951, 65 y.o.   MRN: [id number]   Subjective:    Patient ID: Victoria Walker, female    DOB: [birth year], 65 y.o.   MRN: [id number]  Chief Complaint  Patient presents with  . Follow-up    HPI Patient is in today for follow up. She is feeling well today. No recent illess. Is recovering well from knee surgery. Denies CP/palp/SOB/HA/congestion/fevers/GI or GU c/o. Taking meds as prescribed  Past Medical History  Diagnosis Date  . Hypertension   . Neuropathy ([place])     FEET  . High cholesterol   . Hypothyroidism   . GERD (gastroesophageal reflux disease)   . Multinodular thyroid     PT HAVING TROUBLE SWALLOWING  . H/O hiatal hernia   . Claustrophobia   . H/O iritis     LEFT EYE - STATES HER EYE BECOMES RED AND VERY SENSITIVE TO LIGHT WHEN FLARE UP OF IRITIS  . History of chickenpox 09/26/2014  . History of measles   . History of shingles   . History of MRSA infection   . Medicare annual wellness visit, initial 04/07/2015  . Dysrhythmia     HX OF FAST HEART RATE AND PALPITATIONS - METOPROLOL HAS HELPED  . Wears glasses   . Spinal headache     spinal headache with epidural  . Heart murmur     "benign; 06/2015"  . Type II diabetes mellitus (HCC)     ORAL MEDICATION - NO INSULIN  . History of blood transfusion     "when I had my partial hysterectomy"  . Arthritis     "both knees" (07/02/2015)  . Primary localized osteoarthritis of right knee   . Cancer of right breast [facility])     Past Surgical History  Procedure Laterality Date  . Total thyroidectomy  1976    removed three tumor (2 on right; 1 on left)  . Plantar fascia surgery Left   . Abdominoplasty  1986  . Hernia repair    . Anterior and posterior repair  05/11/2012    Procedure: ANTERIOR (CYSTOCELE) AND POSTERIOR REPAIR (RECTOCELE);  Surgeon: Reece Packer, MD;  [facility];  Service: Urology;;  with graft  . Vaginal prolapse repair  05/11/2012    Procedure:  VAGINAL VAULT SUSPENSION;  Surgeon: Reece Packer, MD;  [facility];  Service: Urology;;  . Cystoscopy with urethral dilatation  05/11/2012    Procedure: CYSTOSCOPY WITH URETHRAL DILATATION;  Surgeon: Reece Packer, MD;  [facility];  Service: Urology;;  . Thyroidectomy N/A 02/23/2014    Procedure: TOTAL THYROIDECTOMY;  Surgeon: Armandina Gemma, MD;  [facility];  Service: General;  Laterality: N/A;  . Vulva / perineum biopsy      vulvar  . Mastectomy Right 2007  . Colonoscopy    . Esophagogastroduodenoscopy    . Total knee arthroplasty Right 07/02/2015  . Umbilical hernia repair  1986  . Breast biopsy Right   . Abdominal hysterectomy  1988  . Tubal ligation    . Cardiac catheterization  1999  . Total knee arthroplasty Right 07/02/2015    Procedure: TOTAL KNEE ARTHROPLASTY;  Surgeon: Elsie Saas, MD;  Location: [place];  Service: Orthopedics;  Laterality: Right;    Family History  Problem Relation Age of Onset  . Hypertension Mother   . Heart disease Mother     pacer, CHF  . Hyperlipidemia Mother   . Kidney disease Mother  1 kidney due to stone  . Kidney Stones Mother   . Hypertension Father   . Cancer Father     lung, psa  . Diabetes Father   . Alcohol abuse Father   . Diabetes Sister   . Cancer Sister   . Hypertension Sister   . Diabetes Brother   . Hypertension Brother   . Hyperlipidemia Brother   . Kidney disease Brother     dialysis  . Stroke Brother   . Hypertension Maternal Aunt   . Hypertension Maternal Uncle   . Hypertension Paternal Aunt   . Cancer Paternal Aunt   . Hypertension Paternal Uncle   . Hypertension Maternal Grandmother   . Heart disease Maternal Grandmother   . Stroke Maternal Grandmother   . Hypertension Maternal Grandfather   . Hypertension Daughter   . Arthritis Son   . Hyperlipidemia Sister   . Hypertension Sister   . Diabetes Sister   . Thyroid disease Sister   . Cancer Sister     thyroid and breast  .  Arthritis Sister     back pain  . Hypertension Sister   . Thyroid disease Sister   . Hypertension Brother   . Hyperlipidemia Brother   . Hypertension Brother   . Hyperlipidemia Brother   . Benign prostatic hyperplasia Brother   . Hypertension Daughter   . Hyperlipidemia Daughter   . Diabetes Daughter     Social History   Social History  . Marital Status: Married    Spouse Name: N/A  . Number of Children: 3  . Years of Education: N/A   Occupational History  .      Retired   Social History Main Topics  . Smoking status: Never Smoker   . Smokeless tobacco: Never Used  . Alcohol Use: No  . Drug Use: No  . Sexual Activity: Not on file     Comment: lives with husband, retired from various jobs, diabetic diet   Other Topics Concern  . Not on file   Social History Narrative    Outpatient Prescriptions Prior to Visit  Medication Sig Dispense Refill  . aspirin 81 MG EC tablet Take 81 mg by mouth every other day. Swallow whole.    Marland Kitchen atorvastatin (LIPITOR) 20 MG tablet Take 1-2 tablets (20-40 mg total) by mouth daily at 6 PM. Take 1 tablet daily and on Saturday and Sunday take 2 tablets. 105 tablet 2  . calcium-vitamin D (OSCAL WITH D) 250-125 MG-UNIT per tablet Take 1 tablet by mouth daily.     . Cholecalciferol (VITAMIN D) 2000 UNITS CAPS Take 1 capsule by mouth every evening.     . Ciclopirox 1 % shampoo Apply 1 each topically every Friday.     . cycloSPORINE (RESTASIS) 0.05 % ophthalmic emulsion Place 1 drop into both eyes 2 (two) times daily.    Marland Kitchen diltiazem (TIAZAC) 360 MG 24 hr capsule TAKE 1 CAPSULE (360 MG TOTAL) BY MOUTH EVERY MORNING. 90 capsule 3  . gabapentin (NEURONTIN) 300 MG capsule Take 1 capsule (300 mg total) by mouth at bedtime. 270 capsule 1  . glucose blood (ONE TOUCH TEST STRIPS) test strip Use as directed twice daily to check blood sugar. DX code E11.9 300 each 1  . levocetirizine (XYZAL) 5 MG tablet Take 1 tablet (5 mg total) by mouth every evening. 90  tablet 1  . levothyroxine (SYNTHROID, LEVOTHROID) 75 MCG tablet Take 0.5-1 tablets (37.5-75 mcg total) by mouth daily before breakfast. Take one  tablet Monday through Friday and one half tablet Saturday and Sunday 90 tablet 1  . loteprednol (LOTEMAX) 0.5 % ophthalmic suspension Place 1 drop into the left eye daily as needed ("arthiritis of the eye").     . metFORMIN (GLUCOPHAGE-XR) 750 MG 24 hr tablet TAKE 1 TABLET IN AM AND 1 TABLET AT supper 180 tablet 1  . metoprolol succinate (TOPROL-XL) 25 MG 24 hr tablet Take 25 mg by mouth 2 (two) times daily.     . mometasone (NASONEX) 50 MCG/ACT nasal spray Place 1-2 sprays into the nose daily as needed (for congestion). 17 g 5  . polyvinyl alcohol-povidone (REFRESH) 1.4-0.6 % ophthalmic solution Place 1 drop into both eyes 2 (two) times daily.    . Probiotic Product (PROBIOTIC DAILY PO) Take 1 capsule by mouth daily.    . sodium chloride (OCEAN) 0.65 % SOLN nasal spray Place 1 spray into both nostrils daily as needed for congestion.    Marland Kitchen spironolactone (ALDACTONE) 25 MG tablet TAKE 1.5 TABLET (37.5) BY MOUTH daily 180 tablet 1  . AVAPRO 150 MG tablet TAKE 1/2-1 TABLET (75-150 MG TOTAL) BY MOUTH DAILY 30 tablet 0  . ciprofloxacin (CIPRO) 250 MG tablet Take 1 tablet (250 mg total) by mouth 2 (two) times daily. 6 tablet 0  . docusate sodium (COLACE) 100 MG capsule 1 tab 2 times a day while on narcotics.  STOOL SOFTENER 60 capsule 0  . esomeprazole (NEXIUM) 40 MG capsule Take 1 capsule (40 mg total) by mouth every morning. 90 capsule 1  . fluconazole (DIFLUCAN) 150 MG tablet Take 1 tablet (150 mg total) by mouth once. May repeat in 3 days prn 2 tablet 0  . HYDROmorphone (DILAUDID) 2 MG tablet 1-2 tablets every 4-6 hrs as needed for pain 100 tablet 0  . levothyroxine (SYNTHROID, LEVOTHROID) 75 MCG tablet TAKE ONE TABLET MONDAY THROUGH FRIDAY AND ONE HALF TABLET SATURDAY AND SUNDAY 90 tablet 1  . polyethylene glycol (MIRALAX / GLYCOLAX) packet 17grams in 16 oz  of water twice a day until bowel movement.  LAXITIVE.  Restart if two days since last bowel movement 14 each 0   No facility-administered medications prior to visit.    Allergies  Allergen Reactions  . Codeine Anxiety    Jittery   . Penicillins Itching  . Sulfa Antibiotics Itching  . Vicodin [Hydrocodone-Acetaminophen] Itching    ITCHING     Review of Systems  Constitutional: Negative for fever and malaise/fatigue.  HENT: Negative for congestion.   Eyes: Negative for blurred vision.  Respiratory: Negative for shortness of breath.   Cardiovascular: Negative for chest pain, palpitations and leg swelling.  Gastrointestinal: Negative for nausea, abdominal pain and blood in stool.  Genitourinary: Negative for dysuria and frequency.  Musculoskeletal: Negative for falls.  Skin: Negative for rash.  Neurological: Negative for dizziness, loss of consciousness and headaches.  Endo/Heme/Allergies: Negative for environmental allergies.  Psychiatric/Behavioral: Negative for depression. The patient is not nervous/anxious.        Objective:    Physical Exam  Constitutional: She is oriented to person, place, and time. She appears well-developed and well-nourished. No distress.  HENT:  Head: Normocephalic and atraumatic.  Nose: Nose normal.  Eyes: Right eye exhibits no discharge. Left eye exhibits no discharge.  Neck: Normal range of motion. Neck supple.  Cardiovascular: Normal rate and regular rhythm.   No murmur heard. Pulmonary/Chest: Effort normal and breath sounds normal.  Abdominal: Soft. Bowel sounds are normal. There is no tenderness.  Musculoskeletal:  She exhibits no edema.  Neurological: She is alert and oriented to person, place, and time.  Skin: Skin is warm and dry.  Psychiatric: She has a normal mood and affect.  Nursing note and vitals reviewed.   BP 120/84 mmHg  Pulse 75  Temp(Src) 98.3 F (36.8 C) (Oral)  Ht 5\' 5"  (1.651 m)  Wt 175 lb 2 oz (79.436 kg)  BMI  29.14 kg/m2  SpO2 98% Wt Readings from Last 3 Encounters:  10/30/15 175 lb 2 oz (79.436 kg)  09/13/15 170 lb (77.111 kg)  07/25/15 174 lb 6.4 oz (79.107 kg)     Lab Results  Component Value Date   WBC 7.7 07/25/2015   HGB 11.9* 07/25/2015   HCT 36.7 07/25/2015   PLT 493.0* 07/25/2015   GLUCOSE 168* 07/04/2015   CHOL 178 04/03/2015   TRIG 265.0* 04/03/2015   HDL 49.90 04/03/2015   LDLDIRECT 101.0 04/03/2015   LDLCALC 86 10/11/2013   ALT 24 06/21/2015   AST 22 06/21/2015   NA 139 07/04/2015   K 4.9 07/04/2015   CL 105 07/04/2015   CREATININE 0.93 07/04/2015   BUN 20 07/04/2015   CO2 25 07/04/2015   TSH 2.16 04/03/2015   INR 1.01 06/21/2015   HGBA1C 6.4* 06/21/2015   MICROALBUR <0.7 12/28/2014    Lab Results  Component Value Date   TSH 2.16 04/03/2015   Lab Results  Component Value Date   WBC 7.7 07/25/2015   HGB 11.9* 07/25/2015   HCT 36.7 07/25/2015   MCV 96.8 07/25/2015   PLT 493.0* 07/25/2015   Lab Results  Component Value Date   NA 139 07/04/2015   K 4.9 07/04/2015   CO2 25 07/04/2015   GLUCOSE 168* 07/04/2015   BUN 20 07/04/2015   CREATININE 0.93 07/04/2015   BILITOT 0.4 06/21/2015   ALKPHOS 57 06/21/2015   AST 22 06/21/2015   ALT 24 06/21/2015   PROT 7.1 06/21/2015   ALBUMIN 4.4 06/21/2015   CALCIUM 9.1 07/04/2015   ANIONGAP 9 07/04/2015   GFR 81.77 04/03/2015   Lab Results  Component Value Date   CHOL 178 04/03/2015   Lab Results  Component Value Date   HDL 49.90 04/03/2015   Lab Results  Component Value Date   LDLCALC 86 10/11/2013   Lab Results  Component Value Date   TRIG 265.0* 04/03/2015   Lab Results  Component Value Date   CHOLHDL 4 04/03/2015   Lab Results  Component Value Date   HGBA1C 6.4* 06/21/2015       Assessment & Plan:   Problem List Items Addressed This Visit    Essential hypertension, benign    Well controlled, no changes to meds. Encouraged heart healthy diet such as the DASH diet and exercise as  tolerated.       Relevant Medications   AVAPRO 150 MG tablet   Other Relevant Orders   TSH   CBC   Comprehensive metabolic panel   Hemoglobin A1c   Lipid panel   Hyperlipidemia, mixed    Encouraged heart healthy diet, increase exercise, avoid trans fats, consider a krill oil cap daily      Relevant Medications   AVAPRO 150 MG tablet   Other Relevant Orders   TSH   CBC   Comprehensive metabolic panel   Hemoglobin A1c   Lipid panel   Diabetic neuropathy (HCC)   Relevant Medications   AVAPRO 150 MG tablet   Other Relevant Orders   TSH   CBC  Comprehensive metabolic panel   Hemoglobin A1c   Lipid panel   Osteopenia    Encouraged to get adequate exercise, calcium and vitamin d intake      Relevant Orders   TSH   CBC   Comprehensive metabolic panel   Hemoglobin A1c   Lipid panel   Hypothyroidism    On Levothyroxine, continue to monitor      Relevant Orders   TSH   CBC   Comprehensive metabolic panel   Hemoglobin A1c   Lipid panel   GERD (gastroesophageal reflux disease) - Primary   Relevant Medications   esomeprazole (NEXIUM) 40 MG capsule   Other Relevant Orders   TSH   CBC   Comprehensive metabolic panel   Hemoglobin A1c   Lipid panel   Right knee DJD    S/p TKR, doing well now      Diabetes mellitus without complication (HCC)   Relevant Medications   AVAPRO 150 MG tablet   Other Relevant Orders   TSH   CBC   Comprehensive metabolic panel   Hemoglobin A1c   Lipid panel      I have discontinued Ms. Tiberio's docusate sodium, HYDROmorphone, polyethylene glycol, ciprofloxacin, and fluconazole. I am also having her maintain her calcium-vitamin D, loteprednol, Ciclopirox, metoprolol succinate, cycloSPORINE, polyvinyl alcohol-povidone, glucose blood, Vitamin D, sodium chloride, Probiotic Product (PROBIOTIC DAILY PO), spironolactone, mometasone, metFORMIN, levothyroxine, gabapentin, atorvastatin, diltiazem, aspirin, levocetirizine, esomeprazole, and  AVAPRO.  Meds ordered this encounter  Medications  . esomeprazole (NEXIUM) 40 MG capsule    Sig: Take 1 capsule (40 mg total) by mouth every morning.    Dispense:  90 capsule    Refill:  2    Pt request brand  . AVAPRO 150 MG tablet    Sig: TAKE 1/2-1 TABLET (75-150 MG TOTAL) BY MOUTH DAILY    Dispense:  90 tablet    Refill:  2     Penni Homans, MD

## 2015-10-30 NOTE — Assessment & Plan Note (Signed)
S/p TKR, doing well now

## 2015-10-30 NOTE — Assessment & Plan Note (Signed)
Encouraged heart healthy diet, increase exercise, avoid trans fats, consider a krill oil cap daily 

## 2015-10-30 NOTE — Patient Instructions (Signed)

## 2015-10-30 NOTE — Assessment & Plan Note (Signed)
On Levothyroxine, continue to monitor 

## 2015-10-30 NOTE — Assessment & Plan Note (Signed)
Encouraged to get adequate exercise, calcium and vitamin d intake 

## 2015-10-30 NOTE — Assessment & Plan Note (Signed)
Well controlled, no changes to meds. Encouraged heart healthy diet such as the DASH diet and exercise as tolerated.  °

## 2015-10-31 ENCOUNTER — Encounter: Payer: Self-pay | Admitting: Obstetrics & Gynecology

## 2015-10-31 ENCOUNTER — Ambulatory Visit (INDEPENDENT_AMBULATORY_CARE_PROVIDER_SITE_OTHER): Payer: Medicare HMO | Admitting: Obstetrics & Gynecology

## 2015-10-31 VITALS — BP 127/78 | HR 77 | Ht 65.0 in | Wt 172.0 lb

## 2015-10-31 DIAGNOSIS — N898 Other specified noninflammatory disorders of vagina: Secondary | ICD-10-CM

## 2015-10-31 NOTE — Progress Notes (Addendum)
Patient ID: Victoria Walker, female   DOB: 1950/09/28, 65 y.o.   MRN: [id number] History:  65 y.o. G3P3003 here today for eval of vaginal itching. Pt took Metrogel last week because of itching. She forgot that sh ehad an appt here for f/u of prior sx. In the past she was taking meds but, was not sure what she was treating. Her last Affirm was negative.  She reports itching since last week. She has been using Vit D but, stopped.  The following portions of the patient's history were reviewed and updated as appropriate: allergies, current medications, past family history, past medical history, past social history, past surgical history and problem list.  Review of Systems:  Pertinent items are noted in HPI.  Objective:  Physical Exam Blood pressure 127/78, pulse 77, height 5\' 5"  (1.651 m), weight 172 lb (78.019 kg). Gen: NAD Pelvic: Normal appearing external genitalia; normal appearing vaginal mucosa.   There is some thick white discharge but, it is not clear whether this is cream or vaginal discharge.  Labs and Imaging No results found.  Assessment & Plan:  Vaginal itching with discharge on exam. Affirm done today  Coconut oil on vulva prn Pt to f/u in 4 weeks when she has NOT used cream to test for BV.   Victoria Walker, M.D., Victoria Walker

## 2015-10-31 NOTE — Patient Instructions (Signed)
Vaginitis Vaginitis is an inflammation of the vagina. It is most often caused by a change in the normal balance of the bacteria and yeast that live in the vagina. This change in balance causes an overgrowth of certain bacteria or yeast, which causes the inflammation. There are different types of vaginitis, but the most common types are:  Bacterial vaginosis.  Yeast infection (candidiasis).  Trichomoniasis vaginitis. This is a sexually transmitted infection (STI).  Viral vaginitis.  Atrophic vaginitis.  Allergic vaginitis. CAUSES  The cause depends on the type of vaginitis. Vaginitis can be caused by:  Bacteria (bacterial vaginosis).  Yeast (yeast infection).  A parasite (trichomoniasis vaginitis)  A virus (viral vaginitis).  Low hormone levels (atrophic vaginitis). Low hormone levels can occur during pregnancy, breastfeeding, or after menopause.  Irritants, such as bubble baths, scented tampons, and feminine sprays (allergic vaginitis). Other factors can change the normal balance of the yeast and bacteria that live in the vagina. These include:  Antibiotic medicines.  Poor hygiene.  Diaphragms, vaginal sponges, spermicides, birth control pills, and intrauterine devices (IUD).  Sexual intercourse.  Infection.  Uncontrolled diabetes.  A weakened immune system. SYMPTOMS  Symptoms can vary depending on the cause of the vaginitis. Common symptoms include:  Abnormal vaginal discharge.  The discharge is white, gray, or yellow with bacterial vaginosis.  The discharge is thick, white, and cheesy with a yeast infection.  The discharge is frothy and yellow or greenish with trichomoniasis.  A bad vaginal odor.  The odor is fishy with bacterial vaginosis.  Vaginal itching, pain, or swelling.  Painful intercourse.  Pain or burning when urinating. Sometimes, there are no symptoms. TREATMENT  Treatment will vary depending on the type of infection.   Bacterial  vaginosis and trichomoniasis are often treated with antibiotic creams or pills.  Yeast infections are often treated with antifungal medicines, such as vaginal creams or suppositories.  Viral vaginitis has no cure, but symptoms can be treated with medicines that relieve discomfort. Your sexual partner should be treated as well.  Atrophic vaginitis may be treated with an estrogen cream, pill, suppository, or vaginal ring. If vaginal dryness occurs, lubricants and moisturizing creams may help. You may be told to avoid scented soaps, sprays, or douches.  Allergic vaginitis treatment involves quitting the use of the product that is causing the problem. Vaginal creams can be used to treat the symptoms. HOME CARE INSTRUCTIONS   Take all medicines as directed by your caregiver.  Keep your genital area clean and dry. Avoid soap and only rinse the area with water.  Avoid douching. It can remove the healthy bacteria in the vagina.  Do not use tampons or have sexual intercourse until your vaginitis has been treated. Use sanitary pads while you have vaginitis.  Wipe from front to back. This avoids the spread of bacteria from the rectum to the vagina.  Let air reach your genital area.  Wear cotton underwear to decrease moisture buildup.  Avoid wearing underwear while you sleep until your vaginitis is gone.  Avoid tight pants and underwear or nylons without a cotton panel.  Take off wet clothing (especially bathing suits) as soon as possible.  Use mild, non-scented products. Avoid using irritants, such as:  Scented feminine sprays.  Fabric softeners.  Scented detergents.  Scented tampons.  Scented soaps or bubble baths.  Practice safe sex and use condoms. Condoms may prevent the spread of trichomoniasis and viral vaginitis. SEEK MEDICAL CARE IF:   You have abdominal pain.  You   have a fever or persistent symptoms for more than 2-3 days.  You have a fever and your symptoms suddenly  get worse.   This information is not intended to replace advice given to you by your health care provider. Make sure you discuss any questions you have with your health care provider.   Document Released: 02/02/2007 Document Revised: 08/22/2014 Document Reviewed: 09/18/2011 Elsevier Interactive Patient Education 2016 [employer] Atrophic Vaginitis Atrophic vaginitis is a condition in which the tissues that line the vagina become dry and thin. This condition is most common in women who have stopped having regular menstrual periods (menopause). This usually starts when a woman is 88-26 years old. Estrogen helps to keep the vagina moist. It stimulates the vagina to produce a clear fluid that lubricates the vagina for sexual intercourse. This fluid also protects the vagina from infection. Lack of estrogen can cause the lining of the vagina to get thinner and dryer. The vagina may also shrink in size. It may become less elastic. Atrophic vaginitis tends to get worse over time as a woman's estrogen level drops. CAUSES This condition is caused by the normal drop in estrogen that happens around the time of menopause. RISK FACTORS Certain conditions or situations may lower a woman's estrogen level, which increases her risk of atrophic vaginitis. These include:  Taking medicine that blocks estrogen.  Having ovaries removed surgically.  Being treated for cancer with X-ray treatment (radiation) or medicines (chemotherapy).  Exercising very hard and often.  Having an eating disorder (anorexia).  Giving birth or breastfeeding.  Being over the age of 17.  Smoking. SYMPTOMS Symptoms of this condition include:  Pain, soreness, or bleeding during sexual intercourse (dyspareunia).  Vaginal burning, irritation, or itching.  Pain or bleeding during a vaginal examination using a speculum (pelvic exam).  Loss of interest in sexual activity.  Having burning pain when passing urine.  Vaginal  discharge that is brown or yellow. In some cases, there are no symptoms. DIAGNOSIS This condition is diagnosed with a medical history and physical exam. This will include a pelvic exam that checks whether the inside of your vagina appears pale, thin, or dry. Rarely, you may also have other tests, including:  A urine test.  A test that checks the acid balance in your vaginal fluid (acid balance test). TREATMENT Treatment for this condition may depend on the severity of your symptoms. Treatment may include:  Using an over-the-counter vaginal lubricant before you have sexual intercourse.  Using a long-acting vaginal moisturizer.  Using low-dose vaginal estrogen for moderate to severe symptoms that do not respond to other treatments. Options include creams, tablets, and inserts (vaginal rings). Before using vaginal estrogen, tell your health care provider if you have a history of:  Breast cancer.  Endometrial cancer.  Blood clots.  Taking medicines. You may be able to take a daily pill for dyspareunia. Discuss all of the risks of this medicine with your health care provider. It is usually not recommended for women who have a family history or personal history of breast cancer. If your symptoms are very mild and you are not sexually active, you may not need treatment. HOME CARE INSTRUCTIONS  Take medicines only as directed by your health care provider. Do not use herbal or alternative medicines unless your health care provider says that you can.  Use over-the-counter creams, lubricants, or moisturizers for dryness only as directed by your health care provider.  If your atrophic vaginitis is caused by menopause, discuss all  of your menopausal symptoms and treatment options with your health care provider.  Do not douche.  Do not use products that can make your vagina dry. These include:  Scented feminine sprays.  Scented tampons.  Scented soaps.  If it hurts to have sex, talk with  your sexual partner. SEEK MEDICAL CARE IF:  Your discharge looks different than normal.  Your vagina has an unusual smell.  You have new symptoms.  Your symptoms do not improve with treatment.  Your symptoms get worse.   This information is not intended to replace advice given to you by your health care provider. Make sure you discuss any questions you have with your health care provider.   Document Released: 08/22/2014 Document Reviewed: 08/22/2014 Elsevier Interactive Patient Education [employer].

## 2015-11-01 ENCOUNTER — Other Ambulatory Visit: Payer: Self-pay | Admitting: Obstetrics & Gynecology

## 2015-11-01 ENCOUNTER — Telehealth: Payer: Self-pay

## 2015-11-01 DIAGNOSIS — B9689 Other specified bacterial agents as the cause of diseases classified elsewhere: Secondary | ICD-10-CM

## 2015-11-01 DIAGNOSIS — N760 Acute vaginitis: Secondary | ICD-10-CM

## 2015-11-01 LAB — WET PREP BY MOLECULAR PROBE
Candida species: NEGATIVE
Gardnerella vaginalis: POSITIVE — AB
Trichomonas vaginosis: NEGATIVE

## 2015-11-01 MED ORDER — METRONIDAZOLE 500 MG PO TABS: 500.0000 mg | ORAL_TABLET | Freq: Three times a day (TID) | ORAL | Status: DC

## 2015-11-01 NOTE — Telephone Encounter (Signed)
Left message for patient to return call to clinic for results. Patient has bacterial vaginosis and Flagyl sent in to pharmacy by Dr. March Rummage RNBSN

## 2015-11-02 MED ORDER — FLUCONAZOLE 150 MG PO TABS: 150.0000 mg | ORAL_TABLET | Freq: Once | ORAL | Status: DC

## 2015-11-02 NOTE — Telephone Encounter (Signed)
Completed form and OV notes faxed to [phone] successfully Copy sent for scanning

## 2015-11-02 NOTE — Telephone Encounter (Signed)
Patient called and made aware she has bacterial vaginosis and rx sent to [facility]. Patient requested Diflucan because antibiotics always give her a yeast infection. Patient states she will pick up Rx s and see [place] in four weeks for follow up. Kathrene Alu RN BSN

## 2015-11-08 ENCOUNTER — Encounter: Payer: Self-pay | Admitting: Family Medicine

## 2015-11-08 ENCOUNTER — Telehealth: Payer: Self-pay | Admitting: Family Medicine

## 2015-11-08 NOTE — Telephone Encounter (Signed)
Relation to [id number] Call back [place]   Reason for call:  Patient inquiring about lab results. Patient states please leave detail message.

## 2015-11-08 NOTE — Telephone Encounter (Signed)
error:[id number] ° °

## 2015-11-08 NOTE — Telephone Encounter (Signed)
Called the patient informed of lab results/instructions.

## 2015-11-08 NOTE — Telephone Encounter (Signed)
Anemia resolved, sugar stable. Cholesterol is slightly better. But still elevated. Encouraged heart healthy diet, increase exercise, avoid trans fats, consider a krill oil cap daily

## 2015-11-25 ENCOUNTER — Other Ambulatory Visit: Payer: Self-pay | Admitting: Family Medicine

## 2015-11-26 ENCOUNTER — Ambulatory Visit (INDEPENDENT_AMBULATORY_CARE_PROVIDER_SITE_OTHER): Payer: Medicare HMO | Admitting: Obstetrics & Gynecology

## 2015-11-26 ENCOUNTER — Encounter: Payer: Self-pay | Admitting: Obstetrics & Gynecology

## 2015-11-26 VITALS — BP 128/72 | HR 67 | Ht 65.0 in | Wt 171.0 lb

## 2015-11-26 DIAGNOSIS — N898 Other specified noninflammatory disorders of vagina: Secondary | ICD-10-CM | POA: Diagnosis not present

## 2015-11-26 NOTE — Patient Instructions (Signed)

## 2015-11-26 NOTE — Progress Notes (Signed)
History:  65 y.o. G3P3003 here today for f/u of GSM.  Pt reports that the coconut oil caused more itching. She has been using Vit E capsules with some success.  We discussed Intrarosa and she would like to try it if we get samples.   The following portions of the patient's history were reviewed and updated as appropriate: allergies, current medications, past family history, past medical history, past social history, past surgical history and problem list.  Review of Systems:  Pertinent items are noted in HPI.  Objective:  Physical Exam Blood pressure 128/72, pulse 67, height 5\' 5"  (1.651 m), weight 171 lb (77.6 kg). Gen: NAD Pelvic: Normal appearing external genitalia; atrophic vaginal mucosa.  Cervix surgically removed.  Normal discharge.    Labs and Imaging No results found.  Assessment & Plan:  Atrophic vaginitis/GSM  Cont Vit  E capsules Will give samples of Intrarosa (vaginal DHEA) when samples available F/uin 3 months or sooner prn F/u wet mount and KOH  Carlisia Geno L. Harraway-Smith, M.D., Cherlynn June

## 2015-11-27 ENCOUNTER — Telehealth: Payer: Self-pay | Admitting: *Deleted

## 2015-11-27 LAB — WET PREP BY MOLECULAR PROBE
Candida species: NEGATIVE
Gardnerella vaginalis: NEGATIVE
Trichomonas vaginosis: NEGATIVE

## 2015-11-27 NOTE — Telephone Encounter (Signed)
Pt notified of neg wet prep results.

## 2015-11-27 NOTE — Telephone Encounter (Signed)
-----   Message from Lavonia Drafts, MD sent at 11/27/2015  1:37 PM EDT ----- Pls call pt.  Her wet mount was negative.  Thx, clh-S

## 2015-12-06 ENCOUNTER — Telehealth: Payer: Self-pay | Admitting: Family Medicine

## 2015-12-06 NOTE — Telephone Encounter (Signed)
Called the patient who informed she has failed generic prilosec and generic protonix.   Insurance preferred one of those over nexium.  But patient failed them already and preferred to stay on nexium. Noted per PCP instructions for future reference regarding any future PA's.

## 2015-12-07 ENCOUNTER — Encounter (HOSPITAL_BASED_OUTPATIENT_CLINIC_OR_DEPARTMENT_OTHER): Payer: Self-pay

## 2015-12-07 ENCOUNTER — Emergency Department (HOSPITAL_BASED_OUTPATIENT_CLINIC_OR_DEPARTMENT_OTHER)
Admission: EM | Admit: 2015-12-07 | Discharge: 2015-12-08 | Disposition: A | Discharge status: Home / Self Care | Payer: Medicare HMO | Attending: Emergency Medicine | Admitting: Emergency Medicine

## 2015-12-07 ENCOUNTER — Emergency Department (HOSPITAL_BASED_OUTPATIENT_CLINIC_OR_DEPARTMENT_OTHER): Payer: Medicare HMO

## 2015-12-07 DIAGNOSIS — Z853 Personal history of malignant neoplasm of breast: Secondary | ICD-10-CM | POA: Insufficient documentation

## 2015-12-07 DIAGNOSIS — E119 Type 2 diabetes mellitus without complications: Secondary | ICD-10-CM | POA: Insufficient documentation

## 2015-12-07 DIAGNOSIS — Z7982 Long term (current) use of aspirin: Secondary | ICD-10-CM | POA: Diagnosis not present

## 2015-12-07 DIAGNOSIS — S99921A Unspecified injury of right foot, initial encounter: Secondary | ICD-10-CM | POA: Diagnosis present

## 2015-12-07 DIAGNOSIS — S9031XA Contusion of right foot, initial encounter: Secondary | ICD-10-CM | POA: Diagnosis not present

## 2015-12-07 DIAGNOSIS — Z7984 Long term (current) use of oral hypoglycemic drugs: Secondary | ICD-10-CM | POA: Diagnosis not present

## 2015-12-07 DIAGNOSIS — Y92410 Unspecified street and highway as the place of occurrence of the external cause: Secondary | ICD-10-CM | POA: Insufficient documentation

## 2015-12-07 DIAGNOSIS — Y939 Activity, unspecified: Secondary | ICD-10-CM | POA: Diagnosis not present

## 2015-12-07 DIAGNOSIS — Y999 Unspecified external cause status: Secondary | ICD-10-CM | POA: Diagnosis not present

## 2015-12-07 DIAGNOSIS — M25461 Effusion, right knee: Secondary | ICD-10-CM | POA: Diagnosis not present

## 2015-12-07 DIAGNOSIS — I10 Essential (primary) hypertension: Secondary | ICD-10-CM | POA: Insufficient documentation

## 2015-12-07 DIAGNOSIS — M79671 Pain in right foot: Secondary | ICD-10-CM | POA: Diagnosis not present

## 2015-12-07 DIAGNOSIS — E039 Hypothyroidism, unspecified: Secondary | ICD-10-CM | POA: Diagnosis not present

## 2015-12-07 MED ORDER — ACETAMINOPHEN 325 MG PO TABS
650.0000 mg | ORAL_TABLET | Freq: Once | ORAL | Status: AC
  Administered 2015-12-07: 650 mg via ORAL
  Filled 2015-12-07: qty 2

## 2015-12-07 NOTE — Discharge Instructions (Signed)
For pain control please take Ibuprofen (also known as Motrin or Advil) 400mg (this is normally 2 over the counter pills) every 6 hours. Take with food to minimize stomach irritation. ° °Please follow with your primary care doctor in the next 2 days for a check-up. They must obtain records for further management.  ° °Do not hesitate to return to the Emergency Department for any new, worsening or concerning symptoms.  ° °
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
## 2015-12-08 NOTE — ED Notes (Signed)
Pt verbalizes understanding of d/c instructions and denies any further needs at this time. 

## 2015-12-10 ENCOUNTER — Other Ambulatory Visit: Payer: Self-pay | Admitting: Family Medicine

## 2015-12-10 DIAGNOSIS — E119 Type 2 diabetes mellitus without complications: Secondary | ICD-10-CM | POA: Diagnosis not present

## 2015-12-10 DIAGNOSIS — Z96651 Presence of right artificial knee joint: Secondary | ICD-10-CM | POA: Diagnosis not present

## 2015-12-10 DIAGNOSIS — S9031XA Contusion of right foot, initial encounter: Secondary | ICD-10-CM | POA: Diagnosis not present

## 2015-12-12 ENCOUNTER — Encounter: Payer: Self-pay | Admitting: Podiatry

## 2015-12-12 ENCOUNTER — Ambulatory Visit (INDEPENDENT_AMBULATORY_CARE_PROVIDER_SITE_OTHER): Payer: Medicare HMO | Admitting: Podiatry

## 2015-12-12 DIAGNOSIS — M659 Synovitis and tenosynovitis, unspecified: Secondary | ICD-10-CM | POA: Diagnosis not present

## 2015-12-12 DIAGNOSIS — S99921A Unspecified injury of right foot, initial encounter: Secondary | ICD-10-CM | POA: Diagnosis not present

## 2015-12-12 DIAGNOSIS — E1140 Type 2 diabetes mellitus with diabetic neuropathy, unspecified: Secondary | ICD-10-CM | POA: Diagnosis not present

## 2015-12-12 DIAGNOSIS — R262 Difficulty in walking, not elsewhere classified: Secondary | ICD-10-CM
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
## 2015-12-12 NOTE — Patient Instructions (Signed)
Seen for pain in right foot and to measure for diabetic shoes. No bony injury with tenosynovitis on right foot lateral 1/2. Right lower limb placed in CAM walker to use as needed for the next 4-5 weeks.  Both feet measured for diabetic shoes.

## 2015-12-13 ENCOUNTER — Ambulatory Visit: Payer: Medicare HMO | Admitting: Podiatry

## 2015-12-29 ENCOUNTER — Encounter (HOSPITAL_BASED_OUTPATIENT_CLINIC_OR_DEPARTMENT_OTHER): Payer: Self-pay | Admitting: Adult Health

## 2015-12-29 ENCOUNTER — Emergency Department (HOSPITAL_BASED_OUTPATIENT_CLINIC_OR_DEPARTMENT_OTHER)
Admission: EM | Admit: 2015-12-29 | Discharge: 2015-12-29 | Disposition: A | Discharge status: Home / Self Care | Payer: Medicare HMO | Attending: Emergency Medicine | Admitting: Emergency Medicine

## 2015-12-29 DIAGNOSIS — K120 Recurrent oral aphthae: Secondary | ICD-10-CM | POA: Diagnosis not present

## 2015-12-29 DIAGNOSIS — Z7984 Long term (current) use of oral hypoglycemic drugs: Secondary | ICD-10-CM | POA: Insufficient documentation

## 2015-12-29 DIAGNOSIS — R05 Cough: Secondary | ICD-10-CM | POA: Diagnosis not present

## 2015-12-29 DIAGNOSIS — Z79899 Other long term (current) drug therapy: Secondary | ICD-10-CM | POA: Insufficient documentation

## 2015-12-29 DIAGNOSIS — K1370 Unspecified lesions of oral mucosa: Secondary | ICD-10-CM | POA: Diagnosis present

## 2015-12-29 DIAGNOSIS — J309 Allergic rhinitis, unspecified: Secondary | ICD-10-CM | POA: Insufficient documentation

## 2015-12-29 DIAGNOSIS — Z853 Personal history of malignant neoplasm of breast: Secondary | ICD-10-CM | POA: Diagnosis not present

## 2015-12-29 DIAGNOSIS — E119 Type 2 diabetes mellitus without complications: Secondary | ICD-10-CM | POA: Diagnosis not present

## 2015-12-29 DIAGNOSIS — E039 Hypothyroidism, unspecified: Secondary | ICD-10-CM | POA: Insufficient documentation

## 2015-12-29 DIAGNOSIS — Z7982 Long term (current) use of aspirin: Secondary | ICD-10-CM | POA: Diagnosis not present

## 2015-12-29 DIAGNOSIS — I10 Essential (primary) hypertension: Secondary | ICD-10-CM | POA: Diagnosis not present

## 2015-12-29 MED ORDER — FLUTICASONE PROPIONATE 50 MCG/ACT NA SUSP
1.0000 | Freq: Once | NASAL | Status: DC
  Filled 2015-12-29: qty 16

## 2015-12-29 NOTE — ED Provider Notes (Signed)
[facility] Provider Note   CSN: [id number] Arrival date & time: 12/29/15  1753  By signing my name below, I, Dolores Hoose, attest that this documentation has been prepared under the direction and in the presence of Leo Grosser, MD . Electronically Signed: Dolores Hoose, Scribe. 12/29/2015. 6:17 PM.   History   Chief Complaint Chief Complaint  Patient presents with  . Mouth Lesions   The history is provided by the patient. No language interpreter was used.  Mouth Lesions  Location:  Buccal mucosa Buccal mucosa location:  R buccal mucosa Quality:  Red and white Onset quality:  Sudden Severity:  Moderate Duration:  10 hours Progression:  Unchanged Chronicity:  New Relieved by: Peroxide. Worsened by:  Drinking and eating Associated symptoms: congestion   Associated symptoms: no fever   Associated symptoms comment:  Positive for Sinus Pain Positive for cough   HPI Comments:  Victoria Walker is a 65 y.o. female who presents to the Emergency Department complaining of sudden-onset constant right-sided oral legion beginning this morning. She reports waking up with these symptoms. She reports associated sinus pain, sinus pressure, congestion, and cough. She denies any fevers.    Pt reports she has used OTC nasal sprays for sinus relief with minimal relief. This is possibly due to the fact she has difficulty with the correct use the nasal spray.   Past Medical History:  Diagnosis Date  . Arthritis    "both knees" (07/02/2015)  . Cancer of right breast ([place])   . Claustrophobia   . Dysrhythmia    HX OF FAST HEART RATE AND PALPITATIONS - METOPROLOL HAS HELPED  . GERD (gastroesophageal reflux disease)   . H/O hiatal hernia   . H/O iritis    LEFT EYE - STATES HER EYE BECOMES RED AND VERY SENSITIVE TO LIGHT WHEN FLARE UP OF IRITIS  . Heart murmur    "benign; 06/2015"  . High cholesterol   . History of blood transfusion    "when I had my partial hysterectomy"  .  History of chickenpox 09/26/2014  . History of measles   . History of MRSA infection   . History of shingles   . Hypertension   . Hypothyroidism   . Medicare annual wellness visit, initial 04/07/2015  . Multinodular thyroid    PT HAVING TROUBLE SWALLOWING  . Neuropathy ([place])    FEET  . Primary localized osteoarthritis of right knee   . Spinal headache    spinal headache with epidural  . Type II diabetes mellitus ([facility])    ORAL MEDICATION - NO INSULIN  . Wears glasses     Patient Active Problem List   Diagnosis Date Noted  . Primary localized osteoarthritis of right knee   . History of MRSA infection   . History of measles   . Dysrhythmia   . Chronic rhinitis 06/06/2015  . Shortness of breath 06/06/2015  . Vaginitis and vulvovaginitis 04/25/2015  . Lumbar back pain 04/25/2015  . Medicare annual wellness visit, initial 04/07/2015  . Allergic response 01/01/2015  . History of chickenpox 09/26/2014  . History of shingles   . Diabetes mellitus without complication ([place]) [id number]  . 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 Deformity of metatarsal bone of left foot 03/03/2014  . Vulvar irritation  S/P neg vulvar bx 12/2013 01/23/2014  . 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 Right knee DJD 07/12/2013  . Neuralgia of left foot 06/24/2013  . Tarsal  tunnel syndrome 04/08/2013  . Vaginal dryness, menopausal 04/05/2013  . Pronation deformity of ankle, acquired 02/09/2013  . Cortical cataract 02/01/2013  . H/O bone density study 01/26/2013  . Chest pain 01/05/2013  . History of iritis 01/04/2013  . Dry eye syndrome 10/07/2012  . Meibomian gland disease 10/07/2012  . GERD (gastroesophageal reflux disease) 10/05/2012  . Essential hypertension, benign 10/04/2012  . Hyperlipidemia, mixed 10/04/2012  . 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 Diabetic neuropathy ([place]) 10/04/2012  . S/P hysterectomy 10/04/2012  . Osteopenia 10/04/2012  .  Hypothyroidism 10/04/2012  . History of cystocele 10/04/2012  . Dyspareunia in female 10/04/2012  . Plantar fasciitis 10/04/2012  . Nuclear sclerotic cataract 06/17/2012  . Diabetes mellitus ([place]) 03/02/2012  . BP (high blood pressure) 01/06/2012  . Iritis 01/06/2012  . Neuropathy ([place]) 01/06/2012  . Disease of thyroid gland 01/06/2012  . Breast cancer ([place]) 05/06/2011    Past Surgical History:  Procedure Laterality Date  . ABDOMINAL HYSTERECTOMY  1988  . ABDOMINOPLASTY  1986  . ANTERIOR AND POSTERIOR REPAIR  05/11/2012   Procedure: ANTERIOR (CYSTOCELE) AND POSTERIOR REPAIR (RECTOCELE);  Surgeon: Reece Packer, MD;  Location: [facility];  Service: Urology;;  with graft  . BREAST BIOPSY Right   . CARDIAC CATHETERIZATION  1999  . COLONOSCOPY    . CYSTOSCOPY WITH URETHRAL DILATATION  05/11/2012   Procedure: CYSTOSCOPY WITH URETHRAL DILATATION;  Surgeon: Reece Packer, MD;  Location: [facility];  Service: Urology;;  . ESOPHAGOGASTRODUODENOSCOPY    . HERNIA REPAIR    . MASTECTOMY Right 2007  . PLANTAR FASCIA SURGERY Left   . THYROIDECTOMY N/A 02/23/2014   Procedure: TOTAL THYROIDECTOMY;  Surgeon: Armandina Gemma, MD;  Location: [facility];  Service: General;  Laterality: N/A;  . TOTAL KNEE ARTHROPLASTY Right 07/02/2015  . TOTAL KNEE ARTHROPLASTY Right 07/02/2015   Procedure: TOTAL KNEE ARTHROPLASTY;  Surgeon: Elsie Saas, MD;  Location: [facility];  Service: Orthopedics;  Laterality: Right;  . TOTAL THYROIDECTOMY  1976   removed three tumor (2 on right; 1 on left)  . TUBAL LIGATION    . UMBILICAL HERNIA REPAIR  1986  . VAGINAL PROLAPSE REPAIR  05/11/2012   Procedure: VAGINAL VAULT SUSPENSION;  Surgeon: Reece Packer, MD;  Location: [facility];  Service: Urology;;  . [place]borne Dana / PERINEUM BIOPSY     vulvar    OB History    Gravida Para Term Preterm AB Living   3 3 3     3    SAB TAB Ectopic Multiple Live Births                   Home Medications    Prior to Admission medications     Medication Sig Start Date End Date Taking? Authorizing Provider  aspirin 81 MG EC tablet Take 81 mg by mouth every other day. Swallow whole.    Historical Provider, MD  atorvastatin (LIPITOR) 20 MG tablet Take 1-2 tablets (20-40 mg total) by mouth daily at 6 PM. Take 1 tablet daily and on Saturday and Sunday take 2 tablets. 07/04/15   Kirstin Shepperson, PA-C  AVAPRO 150 MG tablet TAKE 1/2-1 TABLET (75-150 MG TOTAL) BY MOUTH DAILY 10/30/15   Mosie Lukes, MD  calcium-vitamin D (OSCAL WITH D) 250-125 MG-UNIT per tablet Take 1 tablet by mouth daily.     Historical Provider, MD  Cholecalciferol (VITAMIN D) 2000 UNITS CAPS Take 1 capsule by mouth every evening.     Historical Provider, MD  Ciclopirox  1 % shampoo Apply 1 each topically every Friday.  06/09/13   Historical Provider, MD  cycloSPORINE (RESTASIS) 0.05 % ophthalmic emulsion Place 1 drop into both eyes 2 (two) times daily.    Historical Provider, MD  diltiazem (TIAZAC) 360 MG 24 hr capsule TAKE 1 CAPSULE (360 MG TOTAL) BY MOUTH EVERY MORNING. 08/22/15   Mosie Lukes, MD  esomeprazole (NEXIUM) 40 MG capsule Take 1 capsule (40 mg total) by mouth every morning. 10/30/15   Mosie Lukes, MD  esomeprazole ([place]) 40 MG capsule TAKE ONE CAPSULE BY MOUTH IN THE MORNING 12/10/15   Mosie Lukes, MD  fluconazole (DIFLUCAN) 150 MG tablet Take 1 tablet (150 mg total) by mouth once. 11/02/15   Lavonia Drafts, MD  gabapentin (NEURONTIN) 300 MG capsule Take 1 capsule (300 mg total) by mouth at bedtime. 07/04/15   Kirstin Shepperson, PA-C  glucose blood (ONE TOUCH TEST STRIPS) test strip Use as directed twice daily to check blood sugar. DX code E11.9 11/07/14   Mosie Lukes, MD  levocetirizine (XYZAL) 5 MG tablet Take 1 tablet (5 mg total) by mouth every evening. 10/12/15   Charlies Silvers, MD  levothyroxine (SYNTHROID, LEVOTHROID) 75 MCG tablet Take 0.5-1 tablets (37.5-75 mcg total) by mouth daily before breakfast. Take one tablet Monday through  Friday and one half tablet Saturday and Sunday 07/04/15   Kirstin Shepperson, PA-C  loteprednol (LOTEMAX) 0.5 % ophthalmic suspension Place 1 drop into the left eye daily as needed ("arthiritis of the eye").     Historical Provider, MD  metFORMIN (GLUCOPHAGE-XR) 750 MG 24 hr tablet TAKE 1 TABLET IN AM AND 1 TABLET AT supper 07/04/15   Kirstin Shepperson, PA-C  metFORMIN (GLUCOPHAGE-XR) 750 MG 24 hr tablet TAKE 1 TABLET IN AM AND 1 TABLET AT LUNCH 11/26/15   Mosie Lukes, MD  metoprolol succinate (TOPROL-XL) 25 MG 24 hr tablet Take 25 mg by mouth 2 (two) times daily.     Historical Provider, MD  metoprolol succinate (TOPROL-XL) 25 MG 24 hr tablet TAKE 1 TABLET BY MOUTH TWICE A DAY AT 12 NOON AND 4PM 11/26/15   Mosie Lukes, MD  metroNIDAZOLE (FLAGYL) 500 MG tablet Take 1 tablet (500 mg total) by mouth 3 (three) times daily. 11/01/15   Lavonia Drafts, MD  mometasone (NASONEX) 50 MCG/ACT nasal spray Place 1-2 sprays into the nose daily as needed (for congestion). 07/04/15   Kirstin Shepperson, PA-C  polyvinyl alcohol-povidone (REFRESH) 1.4-0.6 % ophthalmic solution Place 1 drop into both eyes 2 (two) times daily.    Historical Provider, MD  Probiotic Product (PROBIOTIC DAILY PO) Take 1 capsule by mouth daily.    Historical Provider, MD  sodium chloride (OCEAN) 0.65 % SOLN nasal spray Place 1 spray into both nostrils daily as needed for congestion.    Historical Provider, MD  spironolactone (ALDACTONE) 25 MG tablet TAKE 1.5 TABLET (37.5) BY MOUTH daily 07/04/15   Kirstin Shepperson, PA-C  vitamin E 200 UNIT capsule Take 200 Units by mouth daily. Insert 1 suppository into vagina 2-3 times per week as needed for vaginal discomfort.    Historical Provider, MD    Family History Family History  Problem Relation Age of Onset  . Hypertension Mother   . Heart disease Mother     pacer, CHF  . Hyperlipidemia Mother   . Kidney disease Mother     1 kidney due to stone  . Kidney Stones Mother   .  Hypertension Father   .  Cancer Father     lung, psa  . Diabetes Father   . Alcohol abuse Father   . Diabetes Sister   . Cancer Sister   . Hypertension Sister   . Diabetes Brother   . Hypertension Brother   . Hyperlipidemia Brother   . Kidney disease Brother     dialysis  . Stroke Brother   . Hypertension Maternal Grandmother   . Heart disease Maternal Grandmother   . Stroke Maternal Grandmother   . Hypertension Maternal Grandfather   . Hypertension Daughter   . Arthritis Son   . Hyperlipidemia Sister   . Hypertension Sister   . Diabetes Sister   . Thyroid disease Sister   . Cancer Sister     thyroid and breast  . Arthritis Sister     back pain  . Hypertension Sister   . Thyroid disease Sister   . Hypertension Brother   . Hyperlipidemia Brother   . Hypertension Brother   . Hyperlipidemia Brother   . Benign prostatic hyperplasia Brother   . Hypertension Daughter   . Hyperlipidemia Daughter   . Diabetes Daughter   . Hypertension Maternal Aunt   . Hypertension Maternal Uncle   . Hypertension Paternal Aunt   . Cancer Paternal Aunt   . Hypertension Paternal Uncle     Social History Social History  Substance Use Topics  . Smoking status: Never Smoker  . Smokeless tobacco: Never Used  . Alcohol use No     Allergies   Codeine; Penicillins; Sulfa antibiotics; and Vicodin [hydrocodone-acetaminophen]   Review of Systems Review of Systems  Constitutional: Negative for fever.  HENT: Positive for congestion, mouth sores and sinus pressure.   Respiratory: Positive for cough.   All other systems reviewed and are negative.   Physical Exam Updated Vital Signs BP 128/75   Pulse 74   Temp 98.1 F (36.7 C) (Oral)   Resp 18   Ht 5\' 5"  (1.651 m)   Wt 175 lb (79.4 kg)   SpO2 100%   BMI 29.12 kg/m   Physical Exam  Constitutional: She is oriented to person, place, and time. She appears well-developed and well-nourished. No distress.  HENT:  Head: Normocephalic  and atraumatic.  49mm aphthous ulceration of antrum. Mild swelling of right lower lip. No cranial nerve deficits.  Eyes: Conjunctivae are normal.  Cardiovascular: Normal rate.   Pulmonary/Chest: Effort normal.  Abdominal: She exhibits no distension.  Neurological: She is alert and oriented to person, place, and time.  Skin: Skin is warm and dry.  Psychiatric: She has a normal mood and affect.  Nursing note and vitals reviewed.   ED Treatments / Results  DIAGNOSTIC STUDIES:  Oxygen Saturation is 100% on RA, normal by my interpretation.    COORDINATION OF CARE:  6:16 PM Discussed treatment plan with pt at bedside and pt agreed to plan.  Labs (all labs ordered are listed, but only abnormal results are displayed) Labs Reviewed - No data to display  EKG  EKG Interpretation None       Radiology No results found.  Procedures Procedures (including critical care time)  Medications Ordered in ED Medications - No data to display   Initial Impression / Assessment and Plan / ED Course  I have reviewed the triage vital signs and the nursing notes.  Pertinent labs & imaging results that were available during my care of the patient were reviewed by me and considered in my medical decision making (see chart for details).  Clinical Course    66 y.o. female presents with nasal congestion that she describes as sinus infection and ulceration of lower lip that is c/w aphthous ulceration and is likely viral and self-limited in nature. Supportive care measures recommended for rhinitis and proper use of nasal steroid solution as it appears she had been spraying into her anterior nose instead of into her nasal passages and sinuses.   Final Clinical Impressions(s) / ED Diagnoses   Final diagnoses:  Allergic rhinitis, unspecified allergic rhinitis type  Canker sores oral    New Prescriptions Discharge Medication List as of 12/29/2015  6:43 PM    I personally performed the services  described in this documentation, which was scribed in my presence. The recorded information has been reviewed and is accurate.       Leo Grosser, MD 12/30/15 [phone]

## 2015-12-29 NOTE — ED Triage Notes (Signed)
Presents with canker sore to inner right mucosa of mouth also reports a sinus pain a nd congestion and slight cough. Denies fevers.

## 2016-01-10 ENCOUNTER — Ambulatory Visit (INDEPENDENT_AMBULATORY_CARE_PROVIDER_SITE_OTHER): Payer: Medicare HMO | Admitting: Podiatry

## 2016-01-10 ENCOUNTER — Telehealth: Payer: Self-pay

## 2016-01-10 DIAGNOSIS — M659 Synovitis and tenosynovitis, unspecified: Secondary | ICD-10-CM | POA: Diagnosis not present

## 2016-01-10 DIAGNOSIS — S99921D Unspecified injury of right foot, subsequent encounter: Secondary | ICD-10-CM | POA: Diagnosis not present

## 2016-01-10 NOTE — Patient Instructions (Signed)
Right foot pain resolved. Picked up diabetic shoes. Return as needed.

## 2016-01-10 NOTE — Telephone Encounter (Signed)
Rx for Prilosec has been faxed to pharmacy
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
## 2016-01-11 ENCOUNTER — Encounter: Payer: Self-pay | Admitting: Podiatry

## 2016-01-15 ENCOUNTER — Other Ambulatory Visit: Payer: Self-pay | Admitting: *Deleted

## 2016-01-15 MED ORDER — FLUCONAZOLE 150 MG PO TABS: 150.0000 mg | ORAL_TABLET | Freq: Once | ORAL | 0 refills | Status: AC

## 2016-01-15 NOTE — Telephone Encounter (Signed)
Pt called office stating that she was having vaginal itching and discharge.  Requesting a RF on Diflucan.  There are no MD's in office today, so RX was sent to her pharmacy

## 2016-01-16 ENCOUNTER — Telehealth: Payer: Self-pay

## 2016-01-16 NOTE — Telephone Encounter (Signed)
Patient called yesterday and had some itching and a diflucan she took. Patient states itching has been relieved and will plan on just following up as scheduled on Oct. 11, 2017. Kathrene Alu RNBSN

## 2016-01-21 ENCOUNTER — Ambulatory Visit (INDEPENDENT_AMBULATORY_CARE_PROVIDER_SITE_OTHER): Payer: Medicare HMO

## 2016-01-21 DIAGNOSIS — Z23 Encounter for immunization: Secondary | ICD-10-CM

## 2016-01-30 ENCOUNTER — Encounter: Payer: Self-pay | Admitting: Obstetrics & Gynecology

## 2016-01-30 ENCOUNTER — Ambulatory Visit (INDEPENDENT_AMBULATORY_CARE_PROVIDER_SITE_OTHER): Payer: Medicare HMO | Admitting: Obstetrics & Gynecology

## 2016-01-30 VITALS — BP 133/66 | HR 75 | Resp 16 | Ht 65.0 in | Wt 173.0 lb

## 2016-01-30 DIAGNOSIS — L292 Pruritus vulvae: Secondary | ICD-10-CM

## 2016-01-30 NOTE — Progress Notes (Signed)
History:  64 y.o. G3P3003 here today for eval of vulvar itching. Pt reports that she notes the itching more if she eats sweets or if she misses her evening dose of her oral  Hypoglycemic.  She reports that her sx all became worse after she had her chemo for the breast CA.  She does report relief with the Vit D.  She reports that overall her sx have improved.  She c/o some burning on the vulva with urination.  She reports that all of these sx are mild.  The following portions of the patient's history were reviewed and updated as appropriate: allergies, current medications, past family history, past medical history, past social history, past surgical history and problem list.  Review of Systems:  Pertinent items are noted in HPI.  Objective:  Physical Exam Blood pressure 133/66, pulse 75, resp. rate 16, height 5\' 5"  (1.651 m), weight 173 lb (78.5 kg). Gen: NAD Abd: Soft, nontender and nondistended Pelvic: Normal appearing external genitalia; normal appearing vaginal mucosa.  Normal discharge. No lesions noted.     Assessment & Plan:  Vulvar itching- normal exam  Pt given written Rx for OTC natural vulvar moisturizer: Coconut oil Cocoa butter Almond oil  Vit E Shea butter  She will use and f/u in 6 months or sooner prn

## 2016-02-05 DIAGNOSIS — S9031XD Contusion of right foot, subsequent encounter: Secondary | ICD-10-CM | POA: Diagnosis not present

## 2016-02-20 ENCOUNTER — Other Ambulatory Visit: Payer: Self-pay | Admitting: Family Medicine

## 2016-02-20 MED ORDER — SPIRONOLACTONE 25 MG PO TABS: ORAL_TABLET | ORAL | 1 refills | Status: DC

## 2016-02-20 MED ORDER — ATORVASTATIN CALCIUM 20 MG PO TABS: 20.0000 mg | ORAL_TABLET | Freq: Every day | ORAL | 2 refills | Status: DC

## 2016-02-20 NOTE — Telephone Encounter (Signed)
rx have been printed and awaiting pcp signature Pc

## 2016-02-20 NOTE — Telephone Encounter (Signed)
Caller name: Relationship to patient: Self Can be reached: [phone] Pharmacy:  [id number] - [place], [place] [phone] (Phone) [phone] (Fax)     Reason for call: Refill atorvastatin (LIPITOR) 20 MG tablet [id number] spironolactone (ALDACTONE) 25 MG tablet [id number]

## 2016-02-21 DIAGNOSIS — I10 Essential (primary) hypertension: Secondary | ICD-10-CM | POA: Diagnosis not present

## 2016-02-21 DIAGNOSIS — E1142 Type 2 diabetes mellitus with diabetic polyneuropathy: Secondary | ICD-10-CM | POA: Diagnosis not present

## 2016-02-21 DIAGNOSIS — E039 Hypothyroidism, unspecified: Secondary | ICD-10-CM | POA: Diagnosis not present

## 2016-02-21 DIAGNOSIS — I480 Paroxysmal atrial fibrillation: Secondary | ICD-10-CM | POA: Diagnosis not present

## 2016-02-21 DIAGNOSIS — Z6828 Body mass index (BMI) 28.0-28.9, adult: Secondary | ICD-10-CM | POA: Diagnosis not present

## 2016-02-21 DIAGNOSIS — Z0000 Encounter for general adult medical examination without abnormal findings: Secondary | ICD-10-CM | POA: Diagnosis not present

## 2016-02-21 DIAGNOSIS — E7800 Pure hypercholesterolemia, unspecified: Secondary | ICD-10-CM | POA: Diagnosis not present

## 2016-02-21 DIAGNOSIS — K219 Gastro-esophageal reflux disease without esophagitis: Secondary | ICD-10-CM | POA: Diagnosis not present

## 2016-02-25 ENCOUNTER — Ambulatory Visit (INDEPENDENT_AMBULATORY_CARE_PROVIDER_SITE_OTHER): Payer: Medicare HMO | Admitting: *Deleted

## 2016-02-25 ENCOUNTER — Encounter: Payer: Self-pay | Admitting: *Deleted

## 2016-02-25 VITALS — BP 120/70 | HR 70 | Resp 16 | Ht 65.0 in | Wt 176.6 lb

## 2016-02-25 DIAGNOSIS — Z0000 Encounter for general adult medical examination without abnormal findings: Secondary | ICD-10-CM | POA: Diagnosis not present

## 2016-02-25 NOTE — Patient Instructions (Addendum)
Bring a copy of your advanced directives to your next office visit. Follow up with Dr.Blyth as scheduled.  Diabetes and Foot Care Diabetes may cause you to have problems because of poor blood supply (circulation) to your feet and legs. This may cause the skin on your feet to become thinner, break easier, and heal more slowly. Your skin may become dry, and the skin may peel and crack. You may also have nerve damage in your legs and feet causing decreased feeling in them. You may not notice minor injuries to your feet that could lead to infections or more serious problems. Taking care of your feet is one of the most important things you can do for yourself.  HOME CARE INSTRUCTIONS  Wear shoes at all times, even in the house. Do not go barefoot. Bare feet are easily injured.  Check your feet daily for blisters, cuts, and redness. If you cannot see the bottom of your feet, use a mirror or ask someone for help.  Wash your feet with warm water (do not use hot water) and mild soap. Then pat your feet and the areas between your toes until they are completely dry. Do not soak your feet as this can dry your skin.  Apply a moisturizing lotion or petroleum jelly (that does not contain alcohol and is unscented) to the skin on your feet and to dry, brittle toenails. Do not apply lotion between your toes.  Trim your toenails straight across. Do not dig under them or around the cuticle. File the edges of your nails with an emery board or nail file.  Do not cut corns or calluses or try to remove them with medicine.  Wear clean socks or stockings every day. Make sure they are not too tight. Do not wear knee-high stockings since they may decrease blood flow to your legs.  Wear shoes that fit properly and have enough cushioning. To break in new shoes, wear them for just a few hours a day. This prevents you from injuring your feet. Always look in your shoes before you put them on to be sure there are no objects  inside.  Do not cross your legs. This may decrease the blood flow to your feet.  If you find a minor scrape, cut, or break in the skin on your feet, keep it and the skin around it clean and dry. These areas may be cleansed with mild soap and water. Do not cleanse the area with peroxide, alcohol, or iodine.  When you remove an adhesive bandage, be sure not to damage the skin around it.  If you have a wound, look at it several times a day to make sure it is healing.  Do not use heating pads or hot water bottles. They may burn your skin. If you have lost feeling in your feet or legs, you may not know it is happening until it is too late.  Make sure your health care provider performs a complete foot exam at least annually or more often if you have foot problems. Report any cuts, sores, or bruises to your health care provider immediately. SEEK MEDICAL CARE IF:   You have an injury that is not healing.  You have cuts or breaks in the skin.  You have an ingrown nail.  You notice redness on your legs or feet.  You feel burning or tingling in your legs or feet.  You have pain or cramps in your legs and feet.  Your legs or feet  are numb.  Your feet always feel cold. SEEK IMMEDIATE MEDICAL CARE IF:   There is increasing redness, swelling, or pain in or around a wound.  There is a red line that goes up your leg.  Pus is coming from a wound.  You develop a fever or as directed by your health care provider.  You notice a bad smell coming from an ulcer or wound.   This information is not intended to replace advice given to you by your health care provider. Make sure you discuss any questions you have with your health care provider.   Document Released: 04/04/2000 Document Revised: 12/08/2012 Document Reviewed: 09/14/2012 Elsevier Interactive Patient Education [employer].

## 2016-02-25 NOTE — Progress Notes (Signed)
Pre visit review using our clinic review tool, if applicable. No additional management support is needed unless otherwise documented below in the visit note. 

## 2016-02-25 NOTE — Progress Notes (Addendum)
Subjective:   Victoria Walker is a 65 y.o. female who presents for Medicare Annual (Subsequent) preventive examination.  Review of Systems:  No ROS.  Medicare Wellness Visit.  Cardiac Risk Factors include: diabetes mellitus;hypertension;advanced age (>72men, >67 women);sedentary lifestyle  Sleep patterns:  Sleeps about 8 hrs/night. Wakes up once per night to urinate.  Home Safety/Smoke Alarms:  Lives at home with husband. Feels safe. Smoke alarms in place. Living environment; residence and Firearm Safety: no firearms [place] Safety/Bike Helmet: wears seatbelt.   Counseling:   Eye Exam- Follows with [facility]. Wearing glasses.   Dental- Follows with Dr.Kwon every 6 months.  Female:  Pap-  Hysterectomy     Mammo- 6/17: BiRADS Category 1 Negative  Dexa scan-  07/21/14: Stable osteopenia. Recommended Calcium and Vitamin D supplements. CCS-  07/21/14: 3 polyps removed. Otherwise normal. 7 yr f/u advised per Dr.Mann at [facility]      Objective:     Vitals: BP 120/70 (BP Location: Left Arm, Patient Position: Sitting, Cuff Size: Normal)   Pulse 70   Resp 16   Ht 5\' 5"  (1.651 m)   Wt 176 lb 9.6 oz (80.1 kg)   SpO2 98% Comment: room air  BMI 29.39 kg/m   Body mass index is 29.39 kg/m.   Tobacco History  Smoking Status  . Never Smoker  Smokeless Tobacco  . Never Used     Counseling given: Not Answered   Past Medical History:  Diagnosis Date  . Arthritis    "both knees" (07/02/2015)  . Cancer of right breast ([place])   . Claustrophobia   . Dysrhythmia    HX OF FAST HEART RATE AND PALPITATIONS - METOPROLOL HAS HELPED  . GERD (gastroesophageal reflux disease)   . H/O hiatal hernia   . H/O iritis    LEFT EYE - STATES HER EYE BECOMES RED AND VERY SENSITIVE TO LIGHT WHEN FLARE UP OF IRITIS  . Heart murmur    "benign; 06/2015"  . High cholesterol   . History of blood transfusion    "when I had my partial hysterectomy"  . History of  chickenpox 09/26/2014  . History of measles   . History of MRSA infection   . History of shingles   . Hypertension   . Hypothyroidism   . Medicare annual wellness visit, initial 04/07/2015  . Multinodular thyroid    PT HAVING TROUBLE SWALLOWING  . Neuropathy ([place])    FEET  . Primary localized osteoarthritis of right knee   . Spinal headache    spinal headache with epidural  . Type II diabetes mellitus ([facility])    ORAL MEDICATION - NO INSULIN  . Wears glasses    Past Surgical History:  Procedure Laterality Date  . ABDOMINAL HYSTERECTOMY  1988  . ABDOMINOPLASTY  1986  . ANTERIOR AND POSTERIOR REPAIR  05/11/2012   Procedure: ANTERIOR (CYSTOCELE) AND POSTERIOR REPAIR (RECTOCELE);  Surgeon: Reece Packer, MD;  Location: [facility];  Service: Urology;;  with graft  . BREAST BIOPSY Right   . CARDIAC CATHETERIZATION  1999  . COLONOSCOPY    . CYSTOSCOPY WITH URETHRAL DILATATION  05/11/2012   Procedure: CYSTOSCOPY WITH URETHRAL DILATATION;  Surgeon: Reece Packer, MD;  Location: [facility];  Service: Urology;;  . ESOPHAGOGASTRODUODENOSCOPY    . HERNIA REPAIR    . MASTECTOMY Right 2007  . PLANTAR FASCIA SURGERY Left   . THYROIDECTOMY N/A 02/23/2014   Procedure: TOTAL THYROIDECTOMY;  Surgeon: Armandina Gemma, MD;  Location: [facility];  Service: General;  Laterality: N/A;  . TOTAL KNEE ARTHROPLASTY Right 07/02/2015  . TOTAL KNEE ARTHROPLASTY Right 07/02/2015   Procedure: TOTAL KNEE ARTHROPLASTY;  Surgeon: Elsie Saas, MD;  Location: [facility];  Service: Orthopedics;  Laterality: Right;  . TOTAL THYROIDECTOMY  1976   removed three tumor (2 on right; 1 on left)  . TUBAL LIGATION    . UMBILICAL HERNIA REPAIR  1986  . VAGINAL PROLAPSE REPAIR  05/11/2012   Procedure: VAGINAL VAULT SUSPENSION;  Surgeon: Reece Packer, MD;  Location: [facility];  Service: Urology;;  . VULVA / PERINEUM BIOPSY     vulvar   Family History  Problem Relation Age of Onset  . Hypertension Mother   . Heart disease Mother      pacer, CHF  . Hyperlipidemia Mother   . Kidney disease Mother     1 kidney due to stone  . Kidney Stones Mother   . Hypertension Father   . Cancer Father     lung, psa  . Diabetes Father   . Alcohol abuse Father   . Diabetes Sister   . Cancer Sister   . Hypertension Sister   . Diabetes Brother   . Hypertension Brother   . Hyperlipidemia Brother   . Kidney disease Brother     dialysis  . Stroke Brother   . Hypertension Maternal Grandmother   . Heart disease Maternal Grandmother   . Stroke Maternal Grandmother   . Hypertension Maternal Grandfather   . Hypertension Daughter   . Arthritis Son   . Hyperlipidemia Sister   . Hypertension Sister   . Diabetes Sister   . Thyroid disease Sister   . Cancer Sister     thyroid and breast  . Arthritis Sister     back pain  . Hypertension Sister   . Thyroid disease Sister   . Hypertension Brother   . Hyperlipidemia Brother   . Hypertension Brother   . Hyperlipidemia Brother   . Benign prostatic hyperplasia Brother   . Hypertension Daughter   . Hyperlipidemia Daughter   . Diabetes Daughter   . Hypertension Maternal Aunt   . Hypertension Maternal Uncle   . Hypertension Paternal Aunt   . Cancer Paternal Aunt   . Hypertension Paternal Uncle    History  Sexual Activity  . Sexual activity: Yes  . Birth control/ protection: Surgical    Comment: lives with husband, retired from various jobs, diabetic diet    Outpatient Encounter Prescriptions as of 02/25/2016  Medication Sig  . aspirin 81 MG EC tablet Take 81 mg by mouth every other day. Swallow whole.  Marland Kitchen atorvastatin (LIPITOR) 20 MG tablet Take 1-2 tablets (20-40 mg total) by mouth daily at 6 PM. Take 1 tablet daily and on Saturday and Sunday take 2 tablets.  . AVAPRO 150 MG tablet TAKE 1/2-1 TABLET (75-150 MG TOTAL) BY MOUTH DAILY  . calcium-vitamin D (OSCAL WITH D) 250-125 MG-UNIT per tablet Take 1 tablet by mouth daily.   . Ciclopirox 1 % shampoo Apply 1 each topically every  Friday.   . cycloSPORINE (RESTASIS) 0.05 % ophthalmic emulsion Place 1 drop into both eyes 2 (two) times daily.  Marland Kitchen diltiazem (TIAZAC) 360 MG 24 hr capsule TAKE 1 CAPSULE (360 MG TOTAL) BY MOUTH EVERY Walker.  Marland Kitchen esomeprazole (NEXIUM) 40 MG capsule Take 1 capsule (40 mg total) by mouth every Walker.  Marland Kitchen esomeprazole (NEXIUM) 40 MG capsule TAKE ONE CAPSULE BY MOUTH IN THE Walker  .  gabapentin (NEURONTIN) 300 MG capsule Take 1 capsule (300 mg total) by mouth at bedtime.  Marland Kitchen glucose blood (ONE TOUCH TEST STRIPS) test strip Use as directed twice daily to check blood sugar. DX code E11.9  . levocetirizine (XYZAL) 5 MG tablet Take 1 tablet (5 mg total) by mouth every evening.  Marland Kitchen levothyroxine (SYNTHROID, LEVOTHROID) 75 MCG tablet Take 0.5-1 tablets (37.5-75 mcg total) by mouth daily before breakfast. Take one tablet Monday through Friday and one half tablet Saturday and Sunday  . loteprednol (LOTEMAX) 0.5 % ophthalmic suspension Place 1 drop into the left eye daily as needed ("arthiritis of the eye").   . metFORMIN (GLUCOPHAGE-XR) 750 MG 24 hr tablet TAKE 1 TABLET IN AM AND 1 TABLET AT supper  . metoprolol succinate (TOPROL-XL) 25 MG 24 hr tablet Take 25 mg by mouth 2 (two) times daily.   . mometasone (NASONEX) 50 MCG/ACT nasal spray Place 1-2 sprays into the nose daily as needed (for congestion).  . polyvinyl alcohol-povidone (REFRESH) 1.4-0.6 % ophthalmic solution Place 1 drop into both eyes 2 (two) times daily.  . Probiotic Product (PROBIOTIC DAILY PO) Take 1 capsule by mouth daily.  Marland Kitchen spironolactone (ALDACTONE) 25 MG tablet TAKE 1.5 TABLET (37.5) BY MOUTH daily  . vitamin E 200 UNIT capsule Take 200 Units by mouth daily. Insert 1 suppository into vagina 2-3 times per week as needed for vaginal discomfort.  . Cholecalciferol (VITAMIN D) 2000 UNITS CAPS Take 1 capsule by mouth every evening.   . predniSONE (DELTASONE) 5 MG tablet Take 5 mg by mouth every Walker.  . [DISCONTINUED] metFORMIN  (GLUCOPHAGE-XR) 750 MG 24 hr tablet TAKE 1 TABLET IN AM AND 1 TABLET AT LUNCH  . [DISCONTINUED] metoprolol succinate (TOPROL-XL) 25 MG 24 hr tablet TAKE 1 TABLET BY MOUTH TWICE A DAY AT 12 NOON AND 4PM  . [DISCONTINUED] metroNIDAZOLE (FLAGYL) 500 MG tablet Take 1 tablet (500 mg total) by mouth 3 (three) times daily.  . [DISCONTINUED] sodium chloride (OCEAN) 0.65 % SOLN nasal spray Place 1 spray into both nostrils daily as needed for congestion.   No facility-administered encounter medications on file as of 02/25/2016.     Activities of Daily Living In your present state of health, do you have any difficulty performing the following activities: 02/25/2016 07/02/2015  Hearing? N -  Vision? N -  Difficulty concentrating or making decisions? Y -  Walking or climbing stairs? N -  Dressing or bathing? N -  Doing errands, shopping? N N  Preparing Food and eating ? N -  Using the Toilet? N -  In the past six months, have you accidently leaked urine? N -  Do you have problems with loss of bowel control? N -  Managing your Medications? N -  Managing your Finances? N -  Housekeeping or managing your Housekeeping? N -  Some recent data might be hidden    Patient Care Team: Mosie Lukes, MD as PCP - General (Family Medicine) Juanita Craver, MD as Consulting Physician (Gastroenterology) Dianna Mindi Slicker, MD (Ophthalmology) Myeong Roxine Caddy, DPM as Consulting Physician (Podiatry) Iona Beard, MD as Referring Physician (Optometry) Charlies Silvers, MD as Consulting Physician (Allergy and Immunology) Lavonia Drafts, MD as Consulting Physician (Obstetrics and Gynecology) Elsie Saas, MD as Consulting Physician (Orthopedic Surgery)    Assessment:    Physical assessment deferred to PCP.  Exercise Activities and Dietary recommendations Current Exercise Habits: The patient does not participate in regular exercise at present, Exercise limited by: orthopedic condition(s) (  Right TKA) Diet  (meal preparation, eat out, water intake, caffeinated beverages, dairy products, fruits and vegetables): on average, 3 meals per day  24 hour recall: Breakfast: grits, [place] sausage,egg, toast Lunch: baked chicken, lima beans, broccoli  Dinner: left overs from lunch Drinks about 6 glasses of water per day. Sodas are very rare.  Goals      Patient Stated   . Lose 10 lbs by next year (pt-stated)      Other   . Increase physical activity      Fall Risk Fall Risk  02/25/2016 02/22/2015 01/23/2014 11/14/2013  Falls in the past year? No No No No   Depression Screen PHQ 2/9 Scores 02/25/2016 02/22/2015 01/23/2014  PHQ - 2 Score 0 0 0     Cognitive Function MMSE - Mini Mental State Exam 02/25/2016 02/22/2015  Orientation to time 5 5  Orientation to Place 5 5  Registration 3 3  Attention/ Calculation 5 5  Recall 3 1  Language- name 2 objects 2 2  Language- repeat 1 1  Language- follow 3 step command 3 3  Language- read & follow direction 1 1  Write a sentence 1 1  Copy design 0 1  Total score 29 28      Diabetic Foot Exam - Simple   Simple Foot Form Diabetic Foot exam was performed with the following findings:  Yes 02/25/2016 10:50 AM  Visual Inspection No deformities, no ulcerations, no other skin breakdown bilaterally:  Yes Sensation Testing Intact to touch and monofilament testing bilaterally:  Yes Pulse Check Posterior Tibialis and Dorsalis pulse intact bilaterally:  Yes Comments        Immunization History  Administered Date(s) Administered  . Influenza, High Dose Seasonal PF 01/21/2016  . Influenza,inj,Quad PF,36+ Mos 01/04/2013, 01/23/2014, 12/28/2014  . Pneumococcal Conjugate-13 12/28/2014  . Pneumococcal Polysaccharide-23 05/12/2012  . Tdap 05/02/2014   Screening Tests Health Maintenance  Topic Date Due  . HEMOGLOBIN A1C  05/01/2016  . OPHTHALMOLOGY EXAM  06/18/2016  . FOOT EXAM  02/24/2017  . PNA vac Low Risk Adult (2 of 2 - PPSV23) 05/12/2017  .  MAMMOGRAM  10/07/2017  . COLONOSCOPY  07/20/2021  . TETANUS/TDAP  05/02/2024  . INFLUENZA VACCINE  Completed  . DEXA SCAN  Completed  . ZOSTAVAX  Addressed  . Hepatitis C Screening  Completed      Plan:   Bring a copy of your advanced directives to your next office visit. Follow up with Dr.Blyth as scheduled. Continue to eat heart healthy diet (full of fruits, vegetables, whole grains, lean protein, water--limit salt, fat, and sugar intake) and increase physical activity as tolerated.   During the course of the visit the patient was educated and counseled about the following appropriate screening and preventive services:   Vaccines to include Pneumoccal, Influenza, Hepatitis B, Td, Zostavax, HCV  Cardiovascular Disease  Colorectal cancer screening  Bone density screening  Diabetes screening  Glaucoma screening  Mammography/PAP  Nutrition counseling   Patient Instructions (the written plan) was given to the patient.   Shela Nevin, [place]  02/25/2016   RN AWV note reviewed. Agree with documention and plan. Penni Homans, MD

## 2016-03-05 DIAGNOSIS — E119 Type 2 diabetes mellitus without complications: Secondary | ICD-10-CM | POA: Diagnosis not present

## 2016-03-18 ENCOUNTER — Other Ambulatory Visit: Payer: Self-pay | Admitting: Allergy and Immunology

## 2016-03-18 ENCOUNTER — Other Ambulatory Visit: Payer: Self-pay | Admitting: Pediatrics

## 2016-03-18 ENCOUNTER — Other Ambulatory Visit: Payer: Self-pay | Admitting: Family Medicine

## 2016-03-18 DIAGNOSIS — Z780 Asymptomatic menopausal state: Secondary | ICD-10-CM

## 2016-03-18 DIAGNOSIS — E785 Hyperlipidemia, unspecified: Secondary | ICD-10-CM

## 2016-03-18 DIAGNOSIS — I10 Essential (primary) hypertension: Secondary | ICD-10-CM

## 2016-03-18 DIAGNOSIS — K219 Gastro-esophageal reflux disease without esophagitis: Secondary | ICD-10-CM

## 2016-03-18 DIAGNOSIS — R109 Unspecified abdominal pain: Secondary | ICD-10-CM

## 2016-03-18 MED ORDER — GABAPENTIN 300 MG PO CAPS: 300.0000 mg | ORAL_CAPSULE | Freq: Every day | ORAL | 1 refills | Status: DC

## 2016-03-18 MED ORDER — LEVOTHYROXINE SODIUM 75 MCG PO TABS: 37.5000 ug | ORAL_TABLET | Freq: Every day | ORAL | 1 refills | Status: DC

## 2016-03-18 NOTE — Telephone Encounter (Signed)
Relation to [id number] Call back number:[phone] Pharmacy: [id number] - [place], [place] - [address] AT [address]OSS [address] [phone] (Phone) [phone] (Fax)    Reason for call:  Patient requesting a refill levothyroxine (SYNTHROID, LEVOTHROID) 75 MCG tablet, gabapentin (NEURONTIN) 300 MG capsule

## 2016-03-19 ENCOUNTER — Ambulatory Visit (INDEPENDENT_AMBULATORY_CARE_PROVIDER_SITE_OTHER): Payer: Medicare HMO | Admitting: Podiatry

## 2016-03-19 ENCOUNTER — Encounter: Payer: Self-pay | Admitting: Podiatry

## 2016-03-19 VITALS — BP 131/72 | HR 72

## 2016-03-19 DIAGNOSIS — S99921D Unspecified injury of right foot, subsequent encounter: Secondary | ICD-10-CM

## 2016-03-19 DIAGNOSIS — M659 Synovitis and tenosynovitis, unspecified: Secondary | ICD-10-CM | POA: Diagnosis not present

## 2016-03-19 NOTE — Patient Instructions (Signed)
Pain in right foot. May need slow down in activity. Continue in Tennis shoes with orthotics.  May use Tylenol as needed.`` Return as needed.
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
## 2016-03-23 DIAGNOSIS — M76821 Posterior tibial tendinitis, right leg: Secondary | ICD-10-CM | POA: Diagnosis not present

## 2016-03-24 DIAGNOSIS — Z9011 Acquired absence of right breast and nipple: Secondary | ICD-10-CM | POA: Diagnosis not present

## 2016-03-24 DIAGNOSIS — C50911 Malignant neoplasm of unspecified site of right female breast: Secondary | ICD-10-CM | POA: Diagnosis not present

## 2016-03-26 DIAGNOSIS — M25571 Pain in right ankle and joints of right foot: Secondary | ICD-10-CM | POA: Diagnosis not present

## 2016-03-26 DIAGNOSIS — M25671 Stiffness of right ankle, not elsewhere classified: Secondary | ICD-10-CM | POA: Diagnosis not present

## 2016-03-26 DIAGNOSIS — R262 Difficulty in walking, not elsewhere classified: Secondary | ICD-10-CM | POA: Diagnosis not present

## 2016-03-26 DIAGNOSIS — R531 Weakness: Secondary | ICD-10-CM | POA: Diagnosis not present

## 2016-03-26 DIAGNOSIS — M76821 Posterior tibial tendinitis, right leg: Secondary | ICD-10-CM | POA: Diagnosis not present

## 2016-03-28 ENCOUNTER — Other Ambulatory Visit: Payer: Self-pay | Admitting: Family Medicine

## 2016-03-31 DIAGNOSIS — R531 Weakness: Secondary | ICD-10-CM | POA: Diagnosis not present

## 2016-03-31 DIAGNOSIS — M25671 Stiffness of right ankle, not elsewhere classified: Secondary | ICD-10-CM | POA: Diagnosis not present

## 2016-03-31 DIAGNOSIS — R262 Difficulty in walking, not elsewhere classified: Secondary | ICD-10-CM | POA: Diagnosis not present

## 2016-03-31 DIAGNOSIS — M25571 Pain in right ankle and joints of right foot: Secondary | ICD-10-CM | POA: Diagnosis not present

## 2016-03-31 DIAGNOSIS — M76821 Posterior tibial tendinitis, right leg: Secondary | ICD-10-CM | POA: Diagnosis not present

## 2016-04-01 DIAGNOSIS — Z8669 Personal history of other diseases of the nervous system and sense organs: Secondary | ICD-10-CM | POA: Diagnosis not present

## 2016-04-01 DIAGNOSIS — E119 Type 2 diabetes mellitus without complications: Secondary | ICD-10-CM | POA: Diagnosis not present

## 2016-04-01 DIAGNOSIS — H40023 Open angle with borderline findings, high risk, bilateral: Secondary | ICD-10-CM | POA: Diagnosis not present

## 2016-04-01 DIAGNOSIS — H04123 Dry eye syndrome of bilateral lacrimal glands: Secondary | ICD-10-CM | POA: Diagnosis not present

## 2016-04-01 DIAGNOSIS — H0289 Other specified disorders of eyelid: Secondary | ICD-10-CM | POA: Diagnosis not present

## 2016-04-01 LAB — HM DIABETES EYE EXAM

## 2016-04-02 DIAGNOSIS — R262 Difficulty in walking, not elsewhere classified: Secondary | ICD-10-CM | POA: Diagnosis not present

## 2016-04-02 DIAGNOSIS — M76821 Posterior tibial tendinitis, right leg: Secondary | ICD-10-CM | POA: Diagnosis not present

## 2016-04-02 DIAGNOSIS — M25671 Stiffness of right ankle, not elsewhere classified: Secondary | ICD-10-CM | POA: Diagnosis not present

## 2016-04-02 DIAGNOSIS — M25571 Pain in right ankle and joints of right foot: Secondary | ICD-10-CM | POA: Diagnosis not present

## 2016-04-03 ENCOUNTER — Encounter: Payer: Self-pay | Admitting: Family Medicine

## 2016-04-03 ENCOUNTER — Ambulatory Visit (INDEPENDENT_AMBULATORY_CARE_PROVIDER_SITE_OTHER): Payer: Medicare HMO | Admitting: Family Medicine

## 2016-04-03 DIAGNOSIS — M7751 Other enthesopathy of right foot: Secondary | ICD-10-CM

## 2016-04-03 DIAGNOSIS — E039 Hypothyroidism, unspecified: Secondary | ICD-10-CM | POA: Diagnosis not present

## 2016-04-03 DIAGNOSIS — E118 Type 2 diabetes mellitus with unspecified complications: Secondary | ICD-10-CM

## 2016-04-03 DIAGNOSIS — K219 Gastro-esophageal reflux disease without esophagitis: Secondary | ICD-10-CM

## 2016-04-03 DIAGNOSIS — E782 Mixed hyperlipidemia: Secondary | ICD-10-CM | POA: Diagnosis not present

## 2016-04-03 DIAGNOSIS — I10 Essential (primary) hypertension: Secondary | ICD-10-CM | POA: Diagnosis not present

## 2016-04-03 LAB — LIPID PANEL
Cholesterol: 172 mg/dL (ref 0–200)
HDL: 43.3 mg/dL (ref >39.00)
LDL Cholesterol: 91 mg/dL (ref 0–99)
NonHDL: 128.54
Total CHOL/HDL Ratio: 4
Triglycerides: 186 mg/dL — ABNORMAL HIGH (ref 0.0–149.0)
VLDL: 37.2 mg/dL (ref 0.0–40.0)

## 2016-04-03 LAB — COMPREHENSIVE METABOLIC PANEL
ALT: 18 U/L (ref 0–35)
AST: 16 U/L (ref 0–37)
Albumin: 4.4 g/dL (ref 3.5–5.2)
Alkaline Phosphatase: 52 U/L (ref 39–117)
BUN: 16 mg/dL (ref 6–23)
CO2: 30 mEq/L (ref 19–32)
Calcium: 9.5 mg/dL (ref 8.4–10.5)
Chloride: 104 mEq/L (ref 96–112)
Creatinine, Ser: 0.87 mg/dL (ref 0.40–1.20)
GFR: 83.68 mL/min (ref >60.00)
Glucose, Bld: 150 mg/dL — ABNORMAL HIGH (ref 70–99)
Potassium: 4.1 mEq/L (ref 3.5–5.1)
Sodium: 140 mEq/L (ref 135–145)
Total Bilirubin: 0.4 mg/dL (ref 0.2–1.2)
Total Protein: 6.7 g/dL (ref 6.0–8.3)

## 2016-04-03 LAB — TSH: TSH: 2.23 u[IU]/mL (ref 0.35–4.50)

## 2016-04-03 LAB — HEMOGLOBIN A1C: Hgb A1c MFr Bld: 6.6 % — ABNORMAL HIGH (ref 4.6–6.5)

## 2016-04-03 NOTE — Assessment & Plan Note (Signed)
Well controlled, no changes to meds. Encouraged heart healthy diet such as the DASH diet and exercise as tolerated.  °

## 2016-04-03 NOTE — Assessment & Plan Note (Signed)
On Levothyroxine, continue to monitor 

## 2016-04-03 NOTE — Patient Instructions (Addendum)
Try compression hose, light weight knee highs 10 to 20 mmhg. On in am and off in eve Minimize salt and elevate feet above heart 15 minutes 2-3 x daily Lidocaine gel, Aspercreme and [employer]  Peripheral Edema Peripheral edema is swelling that is caused by a buildup of fluid. Peripheral edema most often affects the lower legs, ankles, and feet. It can also develop in the arms, hands, and face. The area of  the body that has peripheral edema will look swollen. It may also feel heavy or warm. Your clothes may start to feel tight. Pressing on the area may make a temporary dent in your skin. You may not be able to move your arm or leg as much as usual. There are many causes of peripheral edema. It can be a complication of other diseases, such as congestive heart failure, kidney disease, or a problem with your blood circulation. It also can be a side effect of certain medicines. It often happens to women during pregnancy. Sometimes, the cause is not known. Treating the underlying condition is often the only treatment for peripheral edema. Follow these instructions at home: Pay attention to any changes in your symptoms. Take these actions to help with your discomfort:  Raise (elevate) your legs while you are sitting or lying down.  Move around often to prevent stiffness and to lessen swelling. Do not sit or stand for long periods of time.  Wear support stockings as told by your health care provider.  Follow instructions from your health care provider about limiting salt (sodium) in your diet. Sometimes eating less salt can reduce swelling.  Take over-the-counter and prescription medicines only as told by your health care provider. Your health care provider may prescribe medicine to help your body get rid of excess water (diuretic).  Keep all follow-up visits as told by your health care provider. This is important. Contact a health care provider if:  You have a  fever.  Your edema starts suddenly or is getting worse, especially if you are pregnant or have a medical condition.  You have swelling in only one leg.  You have increased swelling and pain in your legs. Get help right away if:  You develop shortness of breath, especially when you are lying down.  You have pain in your chest or abdomen.  You feel weak.  You faint. This information is not intended to replace advice given to you by your health care provider. Make sure you discuss any questions you have with your health care provider. Document Released: 05/15/2004 Document Revised: 09/10/2015 Document Reviewed: 10/18/2014 Elsevier Interactive Patient Education  2017 [employer].

## 2016-04-03 NOTE — Progress Notes (Signed)
Pre visit review using our clinic review tool, if applicable. No additional management support is needed unless otherwise documented below in the visit note. 

## 2016-04-03 NOTE — Assessment & Plan Note (Signed)
Tolerating statin, encouraged heart healthy diet, avoid trans fats, minimize simple carbs and saturated fats. Increase exercise as tolerated 

## 2016-04-03 NOTE — Assessment & Plan Note (Signed)
hgba1c acceptable, minimize simple carbs. Increase exercise as tolerated. Continue current meds 

## 2016-04-04 ENCOUNTER — Encounter: Payer: Self-pay | Admitting: Family Medicine

## 2016-04-04 LAB — CBC
HCT: 38.1 % (ref 36.0–46.0)
Hemoglobin: 12.7 g/dL (ref 12.0–15.0)
MCHC: 33.4 g/dL (ref 30.0–36.0)
MCV: 92.5 fl (ref 78.0–100.0)
Platelets: 286 10*3/uL (ref 150.0–400.0)
RBC: 4.12 Mil/uL (ref 3.87–5.11)
RDW: 13.4 % (ref 11.5–15.5)
WBC: 6.5 10*3/uL (ref 4.0–10.5)

## 2016-04-07 DIAGNOSIS — R262 Difficulty in walking, not elsewhere classified: Secondary | ICD-10-CM | POA: Diagnosis not present

## 2016-04-07 DIAGNOSIS — M25571 Pain in right ankle and joints of right foot: Secondary | ICD-10-CM | POA: Diagnosis not present

## 2016-04-07 DIAGNOSIS — R531 Weakness: Secondary | ICD-10-CM | POA: Diagnosis not present

## 2016-04-07 DIAGNOSIS — M25671 Stiffness of right ankle, not elsewhere classified: Secondary | ICD-10-CM | POA: Diagnosis not present

## 2016-04-09 DIAGNOSIS — M76821 Posterior tibial tendinitis, right leg: Secondary | ICD-10-CM | POA: Diagnosis not present

## 2016-04-09 DIAGNOSIS — M25571 Pain in right ankle and joints of right foot: Secondary | ICD-10-CM | POA: Diagnosis not present

## 2016-04-09 DIAGNOSIS — M25671 Stiffness of right ankle, not elsewhere classified: Secondary | ICD-10-CM | POA: Diagnosis not present

## 2016-04-09 DIAGNOSIS — R262 Difficulty in walking, not elsewhere classified: Secondary | ICD-10-CM | POA: Diagnosis not present

## 2016-04-16 DIAGNOSIS — M25671 Stiffness of right ankle, not elsewhere classified: Secondary | ICD-10-CM | POA: Diagnosis not present

## 2016-04-16 DIAGNOSIS — R262 Difficulty in walking, not elsewhere classified: Secondary | ICD-10-CM | POA: Diagnosis not present

## 2016-04-16 DIAGNOSIS — M25571 Pain in right ankle and joints of right foot: Secondary | ICD-10-CM | POA: Diagnosis not present

## 2016-04-16 DIAGNOSIS — M76821 Posterior tibial tendinitis, right leg: Secondary | ICD-10-CM | POA: Diagnosis not present

## 2016-04-20 ENCOUNTER — Emergency Department (INDEPENDENT_AMBULATORY_CARE_PROVIDER_SITE_OTHER)
Admission: EM | Admit: 2016-04-20 | Discharge: 2016-04-20 | Disposition: A | Discharge status: Home / Self Care | Payer: Medicare HMO | Source: Home / Self Care | Attending: Family Medicine | Admitting: Family Medicine

## 2016-04-20 DIAGNOSIS — J069 Acute upper respiratory infection, unspecified: Secondary | ICD-10-CM | POA: Diagnosis not present

## 2016-04-20 MED ORDER — BENZONATATE 100 MG PO CAPS: 100.0000 mg | ORAL_CAPSULE | Freq: Three times a day (TID) | ORAL | 0 refills | Status: DC

## 2016-04-20 MED ORDER — AZITHROMYCIN 250 MG PO TABS
250.0000 mg | ORAL_TABLET | Freq: Every day | ORAL | 0 refills | Status: DC
Start: 2016-04-20 — End: 2016-04-28

## 2016-04-20 NOTE — ED Triage Notes (Signed)
Has had sinus drainage with a sore throat for the last few days.  Coughing up phlegm.

## 2016-04-20 NOTE — ED Provider Notes (Signed)
CSN: [id number]     Arrival date & time 04/20/16  1302 History   First MD Initiated Contact with Patient 04/20/16 1328     Chief Complaint  Patient presents with  . Cough  . Sore Throat   (Consider location/radiation/quality/duration/timing/severity/associated sxs/prior Treatment) HPI Victoria Walker is a 65 y.o. female presenting to UC with c/o URI symptoms of cough, congestion and sinus drainage for about 1 week but worsening over the last few days with sinus pressure and worsening sore throat. Cough has become more productive with phlegm.  She has taken mucinex with mild relief.  She has been around others who have been sick. Denies fever, chills, n/v/d. Denies chest pain or SOB.    Past Medical History:  Diagnosis Date  . Arthritis    "both knees" (07/02/2015)  . Cancer of right breast ([place])   . Claustrophobia   . Dysrhythmia    HX OF FAST HEART RATE AND PALPITATIONS - METOPROLOL HAS HELPED  . GERD (gastroesophageal reflux disease)   . H/O hiatal hernia   . H/O iritis    LEFT EYE - STATES HER EYE BECOMES RED AND VERY SENSITIVE TO LIGHT WHEN FLARE UP OF IRITIS  . Heart murmur    "benign; 06/2015"  . High cholesterol   . History of blood transfusion    "when I had my partial hysterectomy"  . History of chickenpox 09/26/2014  . History of measles   . History of MRSA infection   . History of shingles   . Hypertension   . Hypothyroidism   . Medicare annual wellness visit, initial 04/07/2015  . Multinodular thyroid    PT HAVING TROUBLE SWALLOWING  . Neuropathy ([place])    FEET  . Primary localized osteoarthritis of right knee   . Spinal headache    spinal headache with epidural  . Type II diabetes mellitus ([facility])    ORAL MEDICATION - NO INSULIN  . Wears glasses    Past Surgical History:  Procedure Laterality Date  . ABDOMINAL HYSTERECTOMY  1988  . ABDOMINOPLASTY  1986  . ANTERIOR AND POSTERIOR REPAIR  05/11/2012   Procedure: ANTERIOR (CYSTOCELE) AND POSTERIOR REPAIR  (RECTOCELE);  Surgeon: Reece Packer, MD;  Location: [facility];  Service: Urology;;  with graft  . BREAST BIOPSY Right   . CARDIAC CATHETERIZATION  1999  . COLONOSCOPY    . CYSTOSCOPY WITH URETHRAL DILATATION  05/11/2012   Procedure: CYSTOSCOPY WITH URETHRAL DILATATION;  Surgeon: Reece Packer, MD;  [facility];  Service: Urology;;  . ESOPHAGOGASTRODUODENOSCOPY    . HERNIA REPAIR    . MASTECTOMY Right 2007  . PLANTAR FASCIA SURGERY Left   . THYROIDECTOMY N/A 02/23/2014   Procedure: TOTAL THYROIDECTOMY;  Surgeon: Armandina Gemma, MD;  Location[facility];  Service: General;  Laterality: N/A;  . TOTAL KNEE ARTHROPLASTY Right 07/02/2015  . TOTAL KNEE ARTHROPLASTY Right 07/02/2015   Procedure: TOTAL KNEE ARTHROPLASTY;  Surgeon: Elsie Saas, MD;  Location: [facility];  Service: Orthopedics;  Laterality: Right;  . TOTAL THYROIDECTOMY  1976   removed three tumor (2 on right; 1 on left)  . TUBAL LIGATION    . UMBILICAL HERNIA REPAIR  1986  . VAGINAL PROLAPSE REPAIR  05/11/2012   Procedure: VAGINAL VAULT SUSPENSION;  Surgeon: Reece Packer, MD;  [facility];  Service: Urology;;  . VULVA / PERINEUM BIOPSY     vulvar   Family History  Problem Relation Age of Onset  . Hypertension Mother   .  Heart disease Mother     pacer, CHF  . Hyperlipidemia Mother   . Kidney disease Mother     1 kidney due to stone  . Kidney Stones Mother   . Hypertension Father   . Cancer Father     lung, psa  . Diabetes Father   . Alcohol abuse Father   . Diabetes Sister   . Cancer Sister   . Hypertension Sister   . Diabetes Brother   . Hypertension Brother   . Hyperlipidemia Brother   . Kidney disease Brother     dialysis  . Stroke Brother   . Hypertension Maternal Grandmother   . Heart disease Maternal Grandmother   . Stroke Maternal Grandmother   . Hypertension Maternal Grandfather   . Hypertension Daughter   . Arthritis Son   . Hyperlipidemia Sister   . Hypertension Sister   . Diabetes  Sister   . Thyroid disease Sister   . Cancer Sister     thyroid and breast  . Arthritis Sister     back pain  . Hypertension Sister   . Thyroid disease Sister   . Hypertension Brother   . Hyperlipidemia Brother   . Hypertension Brother   . Hyperlipidemia Brother   . Benign prostatic hyperplasia Brother   . Hypertension Daughter   . Hyperlipidemia Daughter   . Diabetes Daughter   . Hypertension Maternal Aunt   . Hypertension Maternal Uncle   . Hypertension Paternal Aunt   . Cancer Paternal Aunt   . Hypertension Paternal Uncle    Social History  Substance Use Topics  . Smoking status: Never Smoker  . Smokeless tobacco: Never Used  . Alcohol use No   OB History    Gravida Para Term Preterm AB Living   3 3 3     3    SAB TAB Ectopic Multiple Live Births                 Review of Systems  Constitutional: Negative for chills and fever.  HENT: Positive for congestion, postnasal drip, rhinorrhea, sinus pain, sinus pressure and sore throat. Negative for ear pain, trouble swallowing and voice change.   Respiratory: Positive for cough. Negative for shortness of breath.   Cardiovascular: Negative for chest pain and palpitations.  Gastrointestinal: Negative for abdominal pain, diarrhea, nausea and vomiting.  Musculoskeletal: Negative for arthralgias, back pain and myalgias.  Skin: Negative for rash.  Neurological: Negative for dizziness, light-headedness and headaches.    Allergies  Codeine; Penicillins; Sulfa antibiotics; and Vicodin [hydrocodone-acetaminophen]  Home Medications   Prior to Admission medications   Medication Sig Start Date End Date Taking? Authorizing Provider  aspirin 81 MG EC tablet Take 81 mg by mouth every other day. Swallow whole.    Historical Provider, MD  atorvastatin (LIPITOR) 20 MG tablet Take 1-2 tablets (20-40 mg total) by mouth daily at 6 PM. Take 1 tablet daily and on Saturday and Sunday take 2 tablets. 02/20/16   Mosie Lukes, MD  AVAPRO 150  MG tablet TAKE 1/2-1 TABLET (75-150 MG TOTAL) BY MOUTH DAILY 10/30/15   Mosie Lukes, MD  azithromycin (ZITHROMAX) 250 MG tablet Take 1 tablet (250 mg total) by mouth daily. Take first 2 tablets together, then 1 every day until finished. 04/20/16   Noland Fordyce, PA-C  benzonatate (TESSALON) 100 MG capsule Take 1-2 capsules (100-200 mg total) by mouth every 8 (eight) hours. 04/20/16   Noland Fordyce, PA-C  calcium-vitamin D (OSCAL WITH D) 250-125  MG-UNIT per tablet Take 1 tablet by mouth daily.     Historical Provider, MD  Cholecalciferol (VITAMIN D) 2000 UNITS CAPS Take 1 capsule by mouth every evening.     Historical Provider, MD  Ciclopirox 1 % shampoo Apply 1 each topically every Friday.  06/09/13   Historical Provider, MD  cycloSPORINE (RESTASIS) 0.05 % ophthalmic emulsion Place 1 drop into both eyes 2 (two) times daily.    Historical Provider, MD  diltiazem (TIAZAC) 360 MG 24 hr capsule TAKE 1 CAPSULE (360 MG TOTAL) BY MOUTH EVERY MORNING. 08/22/15   Mosie Lukes, MD  esomeprazole (NEXIUM) 40 MG capsule Take 1 capsule (40 mg total) by mouth every morning. 10/30/15   Mosie Lukes, MD  esomeprazole (NEXIUM) 40 MG capsule TAKE ONE CAPSULE BY MOUTH IN THE MORNING 12/10/15   Mosie Lukes, MD  gabapentin (NEURONTIN) 300 MG capsule Take 1 capsule (300 mg total) by mouth at bedtime. 03/18/16   Mosie Lukes, MD  glucose blood (ONE TOUCH TEST STRIPS) test strip Use as directed twice daily to check blood sugar. DX code E11.9 11/07/14   Mosie Lukes, MD  levocetirizine (XYZAL) 5 MG tablet TAKE 1 TABLET (5 MG TOTAL) BY MOUTH EVERY EVENING FOR RUNNY NOSE OR ITCHING 03/18/16   Charlies Silvers, MD  levothyroxine (SYNTHROID, LEVOTHROID) 75 MCG tablet Take 0.5-1 tablets (37.5-75 mcg total) by mouth daily before breakfast. Take one tablet Monday through Friday and one half tablet Saturday and Sunday 03/18/16   Mosie Lukes, MD  levothyroxine (SYNTHROID, LEVOTHROID) 75 MCG tablet TAKE ONE TABLET MONDAY THROUGH  FRIDAY AND ONE HALF TABLET SATURDAY AND SUNDAY 03/28/16   Mosie Lukes, MD  loteprednol (LOTEMAX) 0.5 % ophthalmic suspension Place 1 drop into the left eye daily as needed ("arthiritis of the eye").     Historical Provider, MD  metFORMIN (GLUCOPHAGE-XR) 750 MG 24 hr tablet TAKE 1 TABLET IN AM AND 1 TABLET AT supper 07/04/15   Kirstin Shepperson, PA-C  metoprolol succinate (TOPROL-XL) 25 MG 24 hr tablet Take 25 mg by mouth 2 (two) times daily.     Historical Provider, MD  NASONEX 50 MCG/ACT nasal spray USE 1 TO 2 SPRAYS IN [facility] NOSTRIL DAILY FOR NASAL CONGESTION/DRAINAGE 03/18/16   Charlies Silvers, MD  polyvinyl alcohol-povidone (REFRESH) 1.4-0.6 % ophthalmic solution Place 1 drop into both eyes 2 (two) times daily.    Historical Provider, MD  predniSONE (DELTASONE) 5 MG tablet Take 5 mg by mouth every morning. 02/05/16   Historical Provider, MD  Probiotic Product (PROBIOTIC DAILY PO) Take 1 capsule by mouth daily.    Historical Provider, MD  spironolactone (ALDACTONE) 25 MG tablet TAKE 1.5 TABLET (37.5) BY MOUTH daily 02/20/16   Mosie Lukes, MD  vitamin E 200 UNIT capsule Take 200 Units by mouth daily. Insert 1 suppository into vagina 2-3 times per week as needed for vaginal discomfort.    Historical Provider, MD   Meds Ordered and Administered this Visit  Medications - No data to display  BP 128/77 (BP Location: Left Arm)   Pulse 67   Temp 98.1 F (36.7 C) (Oral)   Ht 5\' 5"  (1.651 m)   Wt 180 lb 1.9 oz (81.7 kg)   SpO2 97%   BMI 29.97 kg/m  No data found.   Physical Exam  Constitutional: She appears well-developed and well-nourished. No distress.  HENT:  Head: Normocephalic and atraumatic.  Right Ear: Tympanic membrane normal.  Left Ear: A  middle ear effusion is present.  Nose: Mucosal edema present. Right sinus exhibits maxillary sinus tenderness. Right sinus exhibits no frontal sinus tenderness. Left sinus exhibits no maxillary sinus tenderness and no frontal sinus tenderness.   Mouth/Throat: Uvula is midline and mucous membranes are normal. Posterior oropharyngeal erythema present. No oropharyngeal exudate, posterior oropharyngeal edema or tonsillar abscesses.  Eyes: Conjunctivae are normal. No scleral icterus.  Neck: Normal range of motion. Neck supple.  Cardiovascular: Normal rate, regular rhythm and normal heart sounds.   Pulmonary/Chest: Effort normal and breath sounds normal. No stridor. No respiratory distress. She has no wheezes. She has no rales. She exhibits no tenderness.  Abdominal: Soft. She exhibits no distension. There is no tenderness.  Musculoskeletal: Normal range of motion.  Lymphadenopathy:    She has cervical adenopathy.  Neurological: She is alert.  Skin: Skin is warm and dry. She is not diaphoretic.  Nursing note and vitals reviewed.   Urgent Care Course   Clinical Course     Procedures (including critical care time)  Labs Review Labs Reviewed - No data to display  Imaging Review No results found.  MDM   1. Upper respiratory tract infection, unspecified type    Pt c/o worsening URI symptoms and sinus pain/pressure.  Rx: Azithromcyin and tessalon  F/u with PCP in 1 week if not improving, sooner if worsening.     Noland Fordyce, PA-C 04/20/16 [phone]

## 2016-04-22 NOTE — Progress Notes (Signed)
Patient ID: Victoria Walker, female   DOB: 1950/11/04, 66 y.o.   MRN: [id number]   Subjective:    Patient ID: Victoria Walker, female    DOB: 1951/01/06, 66 y.o.   MRN: [id number]  Chief Complaint  Patient presents with  . Follow-up    HPI Patient is in today for follow up. She is noting increased pain in right ankle and is following with orthopaedics, Dr Noemi Chapel. She is improving with an immobility boot and Voltaren gel. She is also noting some pedal edema b/l uses compression hose with some relief but recurs. Denies CP/palp/SOB/HA/congestion/fevers/GI or GU c/o. Taking meds as prescribed  Past Medical History:  Diagnosis Date  . Arthritis    "both knees" (07/02/2015)  . Cancer of right breast ([place])   . Claustrophobia   . Dysrhythmia    HX OF FAST HEART RATE AND PALPITATIONS - METOPROLOL HAS HELPED  . GERD (gastroesophageal reflux disease)   . H/O hiatal hernia   . H/O iritis    LEFT EYE - STATES HER EYE BECOMES RED AND VERY SENSITIVE TO LIGHT WHEN FLARE UP OF IRITIS  . Heart murmur    "benign; 06/2015"  . High cholesterol   . History of blood transfusion    "when I had my partial hysterectomy"  . History of chickenpox 09/26/2014  . History of measles   . History of MRSA infection   . History of shingles   . Hypertension   . Hypothyroidism   . Medicare annual wellness visit, initial 04/07/2015  . Multinodular thyroid    PT HAVING TROUBLE SWALLOWING  . Neuropathy ([place])    FEET  . Primary localized osteoarthritis of right knee   . Spinal headache    spinal headache with epidural  . Type II diabetes mellitus ([facility])    ORAL MEDICATION - NO INSULIN  . Wears glasses     Past Surgical History:  Procedure Laterality Date  . ABDOMINAL HYSTERECTOMY  1988  . ABDOMINOPLASTY  1986  . ANTERIOR AND POSTERIOR REPAIR  05/11/2012   Procedure: ANTERIOR (CYSTOCELE) AND POSTERIOR REPAIR (RECTOCELE);  Surgeon: Reece Packer, MD;  [facility];  Service: Urology;;  with graft  .  BREAST BIOPSY Right   . CARDIAC CATHETERIZATION  1999  . COLONOSCOPY    . CYSTOSCOPY WITH URETHRAL DILATATION  05/11/2012   Procedure: CYSTOSCOPY WITH URETHRAL DILATATION;  Surgeon: Reece Packer, MD;  [facility];  Service: Urology;;  . ESOPHAGOGASTRODUODENOSCOPY    . HERNIA REPAIR    . MASTECTOMY Right 2007  . PLANTAR FASCIA SURGERY Left   . THYROIDECTOMY N/A 02/23/2014   Procedure: TOTAL THYROIDECTOMY;  Surgeon: Armandina Gemma, MD;  [facility];  Service: General;  Laterality: N/A;  . TOTAL KNEE ARTHROPLASTY Right 07/02/2015  . TOTAL KNEE ARTHROPLASTY Right 07/02/2015   Procedure: TOTAL KNEE ARTHROPLASTY;  Surgeon: Elsie Saas, MD;  Location: [facility];  Service: Orthopedics;  Laterality: Right;  . TOTAL THYROIDECTOMY  1976   removed three tumor (2 on right; 1 on left)  . TUBAL LIGATION    . UMBILICAL HERNIA REPAIR  1986  . VAGINAL PROLAPSE REPAIR  05/11/2012   Procedure: VAGINAL VAULT SUSPENSION;  Surgeon: Reece Packer, MD;  [facility];  Service: Urology;;  . VULVA / PERINEUM BIOPSY     vulvar    Family History  Problem Relation Age of Onset  . Hypertension Mother   . Heart disease Mother     pacer, CHF  . Hyperlipidemia  Mother   . Kidney disease Mother     1 kidney due to stone  . Kidney Stones Mother   . Hypertension Father   . Cancer Father     lung, psa  . Diabetes Father   . Alcohol abuse Father   . Diabetes Sister   . Cancer Sister   . Hypertension Sister   . Diabetes Brother   . Hypertension Brother   . Hyperlipidemia Brother   . Kidney disease Brother     dialysis  . Stroke Brother   . Hypertension Maternal Grandmother   . Heart disease Maternal Grandmother   . Stroke Maternal Grandmother   . Hypertension Maternal Grandfather   . Hypertension Daughter   . Arthritis Son   . Hyperlipidemia Sister   . Hypertension Sister   . Diabetes Sister   . Thyroid disease Sister   . Cancer Sister     thyroid and breast  . Arthritis Sister      back pain  . Hypertension Sister   . Thyroid disease Sister   . Hypertension Brother   . Hyperlipidemia Brother   . Hypertension Brother   . Hyperlipidemia Brother   . Benign prostatic hyperplasia Brother   . Hypertension Daughter   . Hyperlipidemia Daughter   . Diabetes Daughter   . Hypertension Maternal Aunt   . Hypertension Maternal Uncle   . Hypertension Paternal Aunt   . Cancer Paternal Aunt   . Hypertension Paternal Uncle     Social History   Social History  . Marital status: Married    Spouse name: N/A  . Number of children: 3  . Years of education: N/A   Occupational History  .      Retired   Social History Main Topics  . Smoking status: Never Smoker  . Smokeless tobacco: Never Used  . Alcohol use No  . Drug use: No  . Sexual activity: Yes    Birth control/ protection: Surgical     Comment: lives with husband, retired from various jobs, diabetic diet   Other Topics Concern  . Not on file   Social History Narrative  . No narrative on file    Outpatient Medications Prior to Visit  Medication Sig Dispense Refill  . aspirin 81 MG EC tablet Take 81 mg by mouth every other day. Swallow whole.    Marland Kitchen atorvastatin (LIPITOR) 20 MG tablet Take 1-2 tablets (20-40 mg total) by mouth daily at 6 PM. Take 1 tablet daily and on Saturday and Sunday take 2 tablets. 105 tablet 2  . AVAPRO 150 MG tablet TAKE 1/2-1 TABLET (75-150 MG TOTAL) BY MOUTH DAILY 90 tablet 2  . calcium-vitamin D (OSCAL WITH D) 250-125 MG-UNIT per tablet Take 1 tablet by mouth daily.     . Cholecalciferol (VITAMIN D) 2000 UNITS CAPS Take 1 capsule by mouth every evening.     . Ciclopirox 1 % shampoo Apply 1 each topically every Friday.     . cycloSPORINE (RESTASIS) 0.05 % ophthalmic emulsion Place 1 drop into both eyes 2 (two) times daily.    Marland Kitchen diltiazem (TIAZAC) 360 MG 24 hr capsule TAKE 1 CAPSULE (360 MG TOTAL) BY MOUTH EVERY MORNING. 90 capsule 3  . esomeprazole (NEXIUM) 40 MG capsule Take 1 capsule  (40 mg total) by mouth every morning. 90 capsule 2  . esomeprazole (NEXIUM) 40 MG capsule TAKE ONE CAPSULE BY MOUTH IN THE MORNING 90 capsule 1  . gabapentin (NEURONTIN) 300 MG capsule Take 1  capsule (300 mg total) by mouth at bedtime. 270 capsule 1  . glucose blood (ONE TOUCH TEST STRIPS) test strip Use as directed twice daily to check blood sugar. DX code E11.9 300 each 1  . levocetirizine (XYZAL) 5 MG tablet TAKE 1 TABLET (5 MG TOTAL) BY MOUTH EVERY EVENING FOR RUNNY NOSE OR ITCHING 90 tablet 0  . levothyroxine (SYNTHROID, LEVOTHROID) 75 MCG tablet Take 0.5-1 tablets (37.5-75 mcg total) by mouth daily before breakfast. Take one tablet Monday through Friday and one half tablet Saturday and Sunday 90 tablet 1  . levothyroxine (SYNTHROID, LEVOTHROID) 75 MCG tablet TAKE ONE TABLET MONDAY THROUGH FRIDAY AND ONE HALF TABLET SATURDAY AND SUNDAY 90 tablet 1  . loteprednol (LOTEMAX) 0.5 % ophthalmic suspension Place 1 drop into the left eye daily as needed ("arthiritis of the eye").     . metFORMIN (GLUCOPHAGE-XR) 750 MG 24 hr tablet TAKE 1 TABLET IN AM AND 1 TABLET AT supper 180 tablet 1  . metoprolol succinate (TOPROL-XL) 25 MG 24 hr tablet Take 25 mg by mouth 2 (two) times daily.     Marland Kitchen NASONEX 50 MCG/ACT nasal spray USE 1 TO 2 SPRAYS IN [facility] NOSTRIL DAILY FOR NASAL CONGESTION/DRAINAGE 17 g 2  . polyvinyl alcohol-povidone (REFRESH) 1.4-0.6 % ophthalmic solution Place 1 drop into both eyes 2 (two) times daily.    . predniSONE (DELTASONE) 5 MG tablet Take 5 mg by mouth every morning.  1  . Probiotic Product (PROBIOTIC DAILY PO) Take 1 capsule by mouth daily.    Marland Kitchen spironolactone (ALDACTONE) 25 MG tablet TAKE 1.5 TABLET (37.5) BY MOUTH daily 180 tablet 1  . vitamin E 200 UNIT capsule Take 200 Units by mouth daily. Insert 1 suppository into vagina 2-3 times per week as needed for vaginal discomfort.     No facility-administered medications prior to visit.     Allergies  Allergen Reactions  . Codeine  Anxiety    Jittery   . Penicillins Itching  . Sulfa Antibiotics Itching  . Vicodin [Hydrocodone-Acetaminophen] Itching    ITCHING     Review of Systems  Constitutional: Negative for fever and malaise/fatigue.  HENT: Negative for congestion.   Eyes: Negative for blurred vision and double vision.  Respiratory: Negative for shortness of breath.   Cardiovascular: Positive for leg swelling. Negative for chest pain and palpitations.  Gastrointestinal: Negative for abdominal pain, blood in stool and nausea.  Genitourinary: Negative for dysuria and frequency.  Musculoskeletal: Positive for joint pain. Negative for falls.  Skin: Negative for rash.  Neurological: Negative for dizziness, loss of consciousness and headaches.  Endo/Heme/Allergies: Negative for environmental allergies.  Psychiatric/Behavioral: Negative for depression. The patient is not nervous/anxious.        Objective:    Physical Exam  Constitutional: She is oriented to person, place, and time. She appears well-developed and well-nourished. No distress.  HENT:  Head: Normocephalic and atraumatic.  Nose: Nose normal.  Eyes: Right eye exhibits no discharge. Left eye exhibits no discharge.  Neck: Normal range of motion. Neck supple.  Cardiovascular: Normal rate and regular rhythm.   No murmur heard. Pulmonary/Chest: Effort normal and breath sounds normal.  Abdominal: Soft. Bowel sounds are normal. There is no tenderness.  Musculoskeletal: She exhibits edema.  Trace pedal edema b/l  Neurological: She is alert and oriented to person, place, and time.  Skin: Skin is warm and dry.  Psychiatric: She has a normal mood and affect.  Nursing note and vitals reviewed.   BP 110/78 (BP  Location: Left Arm, Patient Position: Sitting, Cuff Size: Normal)   Pulse 76   Temp 98.1 F (36.7 C) (Oral)   Ht 5\' 5"  (1.651 m)   Wt 177 lb (80.3 kg)   SpO2 98%   BMI 29.45 kg/m  Wt Readings from Last 3 Encounters:  04/20/16 180 lb 1.9 oz  (81.7 kg)  04/03/16 177 lb (80.3 kg)  02/25/16 176 lb 9.6 oz (80.1 kg)     Lab Results  Component Value Date   WBC 6.5 04/03/2016   HGB 12.7 04/03/2016   HCT 38.1 04/03/2016   PLT 286.0 04/03/2016   GLUCOSE 150 (H) 04/03/2016   CHOL 172 04/03/2016   TRIG 186.0 (H) 04/03/2016   HDL 43.30 04/03/2016   LDLDIRECT 106.0 10/30/2015   LDLCALC 91 04/03/2016   ALT 18 04/03/2016   AST 16 04/03/2016   NA 140 04/03/2016   K 4.1 04/03/2016   CL 104 04/03/2016   CREATININE 0.87 04/03/2016   BUN 16 04/03/2016   CO2 30 04/03/2016   TSH 2.23 04/03/2016   INR 1.01 06/21/2015   HGBA1C 6.6 (H) 04/03/2016   MICROALBUR <0.7 12/28/2014    Lab Results  Component Value Date   TSH 2.23 04/03/2016   Lab Results  Component Value Date   WBC 6.5 04/03/2016   HGB 12.7 04/03/2016   HCT 38.1 04/03/2016   MCV 92.5 04/03/2016   PLT 286.0 04/03/2016   Lab Results  Component Value Date   NA 140 04/03/2016   K 4.1 04/03/2016   CO2 30 04/03/2016   GLUCOSE 150 (H) 04/03/2016   BUN 16 04/03/2016   CREATININE 0.87 04/03/2016   BILITOT 0.4 04/03/2016   ALKPHOS 52 04/03/2016   AST 16 04/03/2016   ALT 18 04/03/2016   PROT 6.7 04/03/2016   ALBUMIN 4.4 04/03/2016   CALCIUM 9.5 04/03/2016   ANIONGAP 9 07/04/2015   GFR 83.68 04/03/2016   Lab Results  Component Value Date   CHOL 172 04/03/2016   Lab Results  Component Value Date   HDL 43.30 04/03/2016   Lab Results  Component Value Date   LDLCALC 91 04/03/2016   Lab Results  Component Value Date   TRIG 186.0 (H) 04/03/2016   Lab Results  Component Value Date   CHOLHDL 4 04/03/2016   Lab Results  Component Value Date   HGBA1C 6.6 (H) 04/03/2016       Assessment & Plan:   Problem List Items Addressed This Visit    Essential hypertension, benign    Well controlled, no changes to meds. Encouraged heart healthy diet such as the DASH diet and exercise as tolerated.       Relevant Orders   CBC (Completed)   Comprehensive  metabolic panel (Completed)   TSH (Completed)   Hyperlipidemia, mixed    Tolerating statin, encouraged heart healthy diet, avoid trans fats, minimize simple carbs and saturated fats. Increase exercise as tolerated      Relevant Orders   Lipid panel (Completed)   Hypothyroidism    On Levothyroxine, continue to monitor      GERD (gastroesophageal reflux disease)    Avoid offending foods, take probiotics. Do not eat large meals in late evening and consider raising head of bed.       Tendonitis of ankle, right    Patient following with Dr Noemi Chapel and using immobilization and topical treatments to manage her pain      Diabetes mellitus ([place])    hgba1c acceptable, minimize simple carbs.  Increase exercise as tolerated. Continue current meds      Relevant Orders   Hemoglobin A1c (Completed)      I am having Ms. Russett maintain her calcium-vitamin D, loteprednol, Ciclopirox, metoprolol succinate, cycloSPORINE, polyvinyl alcohol-povidone, glucose blood, Vitamin D, Probiotic Product (PROBIOTIC DAILY PO), metFORMIN, diltiazem, aspirin, esomeprazole, AVAPRO, vitamin E, esomeprazole, atorvastatin, spironolactone, predniSONE, levocetirizine, NASONEX, gabapentin, levothyroxine, and levothyroxine.  No orders of the defined types were placed in this encounter.   Penni Homans, MD

## 2016-04-22 NOTE — Assessment & Plan Note (Signed)
Avoid offending foods, take probiotics. Do not eat large meals in late evening and consider raising head of bed.  

## 2016-04-22 NOTE — Assessment & Plan Note (Signed)
Patient following with Dr Noemi Chapel and using immobilization and topical treatments to manage her pain

## 2016-04-23 DIAGNOSIS — M76821 Posterior tibial tendinitis, right leg: Secondary | ICD-10-CM | POA: Diagnosis not present

## 2016-04-23 DIAGNOSIS — R262 Difficulty in walking, not elsewhere classified: Secondary | ICD-10-CM | POA: Diagnosis not present

## 2016-04-23 DIAGNOSIS — M25571 Pain in right ankle and joints of right foot: Secondary | ICD-10-CM | POA: Diagnosis not present

## 2016-04-23 DIAGNOSIS — M25671 Stiffness of right ankle, not elsewhere classified: Secondary | ICD-10-CM | POA: Diagnosis not present

## 2016-04-25 ENCOUNTER — Telehealth: Payer: Self-pay | Admitting: Family Medicine

## 2016-04-25 NOTE — Telephone Encounter (Signed)
Called the patient back and informed of her results from 04/03/2016 per PCP instructions.

## 2016-04-25 NOTE — Telephone Encounter (Signed)
Patient called requesting a call back with her lab results. She is concerned that her A1C may be high. Please advise.   Phone: [phone] (patient states if not home a message can be left)

## 2016-04-28 ENCOUNTER — Ambulatory Visit (INDEPENDENT_AMBULATORY_CARE_PROVIDER_SITE_OTHER): Payer: Medicare HMO | Admitting: Medical

## 2016-04-28 ENCOUNTER — Encounter: Payer: Self-pay | Admitting: Medical

## 2016-04-28 VITALS — BP 118/75 | HR 69 | Temp 98.1°F | Resp 16 | Ht 65.0 in | Wt 177.5 lb

## 2016-04-28 DIAGNOSIS — M25571 Pain in right ankle and joints of right foot: Secondary | ICD-10-CM | POA: Diagnosis not present

## 2016-04-28 DIAGNOSIS — M25671 Stiffness of right ankle, not elsewhere classified: Secondary | ICD-10-CM | POA: Diagnosis not present

## 2016-04-28 DIAGNOSIS — J209 Acute bronchitis, unspecified: Secondary | ICD-10-CM

## 2016-04-28 DIAGNOSIS — R059 Cough, unspecified: Secondary | ICD-10-CM

## 2016-04-28 DIAGNOSIS — R262 Difficulty in walking, not elsewhere classified: Secondary | ICD-10-CM | POA: Diagnosis not present

## 2016-04-28 DIAGNOSIS — J3489 Other specified disorders of nose and nasal sinuses: Secondary | ICD-10-CM | POA: Diagnosis not present

## 2016-04-28 DIAGNOSIS — R05 Cough: Secondary | ICD-10-CM

## 2016-04-28 DIAGNOSIS — R531 Weakness: Secondary | ICD-10-CM | POA: Diagnosis not present

## 2016-04-28 MED ORDER — DOXYCYCLINE HYCLATE 100 MG PO TABS: 100.0000 mg | ORAL_TABLET | Freq: Two times a day (BID) | ORAL | 0 refills | Status: DC

## 2016-04-28 MED ORDER — BENZONATATE 100 MG PO CAPS: 100.0000 mg | ORAL_CAPSULE | Freq: Three times a day (TID) | ORAL | 0 refills | Status: DC | PRN

## 2016-04-28 NOTE — Patient Instructions (Addendum)
You appear to have bronchitis with some sinus pressure. Rest hydrate and tylenol for fever. For cough medication can take 2 of your 100 mg benzonatate tabs if need , and will prescribe doxycyline  antibiotic. For your nasal congestion use your nasonex.   Will put in future order to get cxr if your symptoms don't clear with above treatment.   Follow up in 7-10 days or as needed

## 2016-04-28 NOTE — Progress Notes (Signed)
Pre visit review using our clinic review tool, if applicable. No additional management support is needed unless otherwise documented below in the visit note/SLS  

## 2016-04-28 NOTE — Progress Notes (Signed)
Subjective:    Patient ID: Victoria Walker, female    DOB: 02/17/1951, 66 y.o.   MRN: [id number]  HPI  Pt in with head and chest congestion, pnd and st. Some productive phlem as well. Pt has had symptoms for 1.5 wks. On new years eve she had zpack written at [facility] ED and also given benzonatate. Pt still feels sick. Some better but not completely still lingering symptoms. Also states feels tired.  Pt did not have cxr.  No body aches reported.(no flu like presentation)    Review of Systems  Constitutional: Negative for chills, fatigue and fever.  HENT: Positive for congestion, postnasal drip, sinus pain and sinus pressure. Negative for sore throat.   Respiratory: Positive for cough. Negative for shortness of breath and wheezing.   Cardiovascular: Negative for chest pain and palpitations.  Gastrointestinal: Negative for abdominal pain.  Musculoskeletal: Negative for back pain and myalgias.  Skin: Negative for rash.  Neurological: Negative for dizziness, light-headedness and headaches.  Hematological: Negative for adenopathy. Does not bruise/bleed easily.  Psychiatric/Behavioral: Negative for agitation and confusion.    Past Medical History:  Diagnosis Date  . Arthritis    "both knees" (07/02/2015)  . Cancer of right breast ([place])   . Claustrophobia   . Dysrhythmia    HX OF FAST HEART RATE AND PALPITATIONS - METOPROLOL HAS HELPED  . GERD (gastroesophageal reflux disease)   . H/O hiatal hernia   . H/O iritis    LEFT EYE - STATES HER EYE BECOMES RED AND VERY SENSITIVE TO LIGHT WHEN FLARE UP OF IRITIS  . Heart murmur    "benign; 06/2015"  . High cholesterol   . History of blood transfusion    "when I had my partial hysterectomy"  . History of chickenpox 09/26/2014  . History of measles   . History of MRSA infection   . History of shingles   . Hypertension   . Hypothyroidism   . Medicare annual wellness visit, initial 04/07/2015  . Multinodular thyroid    PT HAVING  TROUBLE SWALLOWING  . Neuropathy ([place])    FEET  . Primary localized osteoarthritis of right knee   . Spinal headache    spinal headache with epidural  . Type II diabetes mellitus ([facility])    ORAL MEDICATION - NO INSULIN  . Wears glasses      Social History   Social History  . Marital status: Married    Spouse name: N/A  . Number of children: 3  . Years of education: N/A   Occupational History  .      Retired   Social History Main Topics  . Smoking status: Never Smoker  . Smokeless tobacco: Never Used  . Alcohol use No  . Drug use: No  . Sexual activity: Yes    Birth control/ protection: Surgical     Comment: lives with husband, retired from various jobs, diabetic diet   Other Topics Concern  . Not on file   Social History Narrative  . No narrative on file    Past Surgical History:  Procedure Laterality Date  . ABDOMINAL HYSTERECTOMY  1988  . ABDOMINOPLASTY  1986  . ANTERIOR AND POSTERIOR REPAIR  05/11/2012   Procedure: ANTERIOR (CYSTOCELE) AND POSTERIOR REPAIR (RECTOCELE);  Surgeon: Reece Packer, MD;  Location: [facility];  Service: Urology;;  with graft  . BREAST BIOPSY Right   . CARDIAC CATHETERIZATION  1999  . COLONOSCOPY    . CYSTOSCOPY WITH URETHRAL DILATATION  05/11/2012  Procedure: CYSTOSCOPY WITH URETHRAL DILATATION;  Surgeon: Reece Packer, MD;  Location: [facility];  Service: Urology;;  . ESOPHAGOGASTRODUODENOSCOPY    . HERNIA REPAIR    . MASTECTOMY Right 2007  . PLANTAR FASCIA SURGERY Left   . THYROIDECTOMY N/A 02/23/2014   Procedure: TOTAL THYROIDECTOMY;  Surgeon: Armandina Gemma, MD;  Location: [facility];  Service: General;  Laterality: N/A;  . TOTAL KNEE ARTHROPLASTY Right 07/02/2015  . TOTAL KNEE ARTHROPLASTY Right 07/02/2015   Procedure: TOTAL KNEE ARTHROPLASTY;  Surgeon: Elsie Saas, MD;  Location: [place];  Service: Orthopedics;  Laterality: Right;  . TOTAL THYROIDECTOMY  1976   removed three tumor (2 on right; 1 on left)  . TUBAL LIGATION    .  UMBILICAL HERNIA REPAIR  1986  . VAGINAL PROLAPSE REPAIR  05/11/2012   Procedure: VAGINAL VAULT SUSPENSION;  Surgeon: Reece Packer, MD;  Location: [facility];  Service: Urology;;  . VULVA / PERINEUM BIOPSY     vulvar    Family History  Problem Relation Age of Onset  . Hypertension Mother   . Heart disease Mother     pacer, CHF  . Hyperlipidemia Mother   . Kidney disease Mother     1 kidney due to stone  . Kidney Stones Mother   . Hypertension Father   . Cancer Father     lung, psa  . Diabetes Father   . Alcohol abuse Father   . Diabetes Sister   . Cancer Sister   . Hypertension Sister   . Diabetes Brother   . Hypertension Brother   . Hyperlipidemia Brother   . Kidney disease Brother     dialysis  . Stroke Brother   . Hypertension Maternal Grandmother   . Heart disease Maternal Grandmother   . Stroke Maternal Grandmother   . Hypertension Maternal Grandfather   . Hypertension Daughter   . Arthritis Son   . Hyperlipidemia Sister   . Hypertension Sister   . Diabetes Sister   . Thyroid disease Sister   . Cancer Sister     thyroid and breast  . Arthritis Sister     back pain  . Hypertension Sister   . Thyroid disease Sister   . Hypertension Brother   . Hyperlipidemia Brother   . Hypertension Brother   . Hyperlipidemia Brother   . Benign prostatic hyperplasia Brother   . Hypertension Daughter   . Hyperlipidemia Daughter   . Diabetes Daughter   . Hypertension Maternal Aunt   . Hypertension Maternal Uncle   . Hypertension Paternal Aunt   . Cancer Paternal Aunt   . Hypertension Paternal Uncle     Allergies  Allergen Reactions  . Codeine Anxiety    Jittery   . Penicillins Itching  . Sulfa Antibiotics Itching  . Vicodin [Hydrocodone-Acetaminophen] Itching    ITCHING     Current Outpatient Prescriptions on File Prior to Visit  Medication Sig Dispense Refill  . aspirin 81 MG EC tablet Take 81 mg by mouth every other day. Swallow whole.    . AVAPRO 150 MG  tablet TAKE 1/2-1 TABLET (75-150 MG TOTAL) BY MOUTH DAILY 90 tablet 2  . benzonatate (TESSALON) 100 MG capsule Take 1-2 capsules (100-200 mg total) by mouth every 8 (eight) hours. 21 capsule 0  . calcium-vitamin D (OSCAL WITH D) 250-125 MG-UNIT per tablet Take 1 tablet by mouth daily.     . Cholecalciferol (VITAMIN D) 2000 UNITS CAPS Take 1 capsule by mouth every evening.     Marland Kitchen  Ciclopirox 1 % shampoo Apply 1 each topically every Friday.     . cycloSPORINE (RESTASIS) 0.05 % ophthalmic emulsion Place 1 drop into both eyes 2 (two) times daily.    Marland Kitchen diltiazem (TIAZAC) 360 MG 24 hr capsule TAKE 1 CAPSULE (360 MG TOTAL) BY MOUTH EVERY MORNING. 90 capsule 3  . esomeprazole (NEXIUM) 40 MG capsule Take 1 capsule (40 mg total) by mouth every morning. 90 capsule 2  . gabapentin (NEURONTIN) 300 MG capsule Take 1 capsule (300 mg total) by mouth at bedtime. 270 capsule 1  . glucose blood (ONE TOUCH TEST STRIPS) test strip Use as directed twice daily to check blood sugar. DX code E11.9 300 each 1  . levocetirizine (XYZAL) 5 MG tablet TAKE 1 TABLET (5 MG TOTAL) BY MOUTH EVERY EVENING FOR RUNNY NOSE OR ITCHING 90 tablet 0  . levothyroxine (SYNTHROID, LEVOTHROID) 75 MCG tablet TAKE ONE TABLET MONDAY THROUGH FRIDAY AND ONE HALF TABLET SATURDAY AND SUNDAY 90 tablet 1  . loteprednol (LOTEMAX) 0.5 % ophthalmic suspension Place 1 drop into the left eye daily as needed ("arthiritis of the eye").     . metFORMIN (GLUCOPHAGE-XR) 750 MG 24 hr tablet TAKE 1 TABLET IN AM AND 1 TABLET AT supper 180 tablet 1  . metoprolol succinate (TOPROL-XL) 25 MG 24 hr tablet Take 25 mg by mouth 2 (two) times daily.     Marland Kitchen NASONEX 50 MCG/ACT nasal spray USE 1 TO 2 SPRAYS IN [facility] NOSTRIL DAILY FOR NASAL CONGESTION/DRAINAGE 17 g 2  . polyvinyl alcohol-povidone (REFRESH) 1.4-0.6 % ophthalmic solution Place 1 drop into both eyes 2 (two) times daily.    . Probiotic Product (PROBIOTIC DAILY PO) Take 1 capsule by mouth daily.    Marland Kitchen spironolactone  (ALDACTONE) 25 MG tablet TAKE 1.5 TABLET (37.5) BY MOUTH daily 180 tablet 1  . atorvastatin (LIPITOR) 20 MG tablet Take 1-2 tablets (20-40 mg total) by mouth daily at 6 PM. Take 1 tablet daily and on Saturday and Sunday take 2 tablets. (Patient taking differently: Take 20 mg by mouth daily at 6 PM. Take 2 tablets on Saturday and Sunday take 2 and 1 tablet on Mon, Tues, Wed, Thurs, Fri.) 105 tablet 2   No current facility-administered medications on file prior to visit.     BP 118/75 (BP Location: Right Arm, Patient Position: Sitting, Cuff Size: Large)   Pulse 69   Temp 98.1 F (36.7 C) (Oral)   Resp 16   Ht 5\' 5"  (1.651 m)   Wt 177 lb 8 oz (80.5 kg)   SpO2 100%   BMI 29.54 kg/m       Objective:   Physical Exam  General  Mental Status - Alert. General Appearance - Well groomed. Not in acute distress.  Skin Rashes- No Rashes.  HEENT Head- Normal. Ear Auditory Canal - Left- Normal. Right - Normal.Tympanic Membrane- Left- Normal. Right- Normal. Eye Sclera/Conjunctiva- Left- Normal. Right- Normal. Nose & Sinuses Nasal Mucosa- Left-  Boggy and Congested. Right-  Boggy and  Congested.Bilateral maxillary and frontal sinus pressure. Mouth & Throat Lips: Upper Lip- Normal: no dryness, cracking, pallor, cyanosis, or vesicular eruption. Lower Lip-Normal: no dryness, cracking, pallor, cyanosis or vesicular eruption. Buccal Mucosa- Bilateral- No Aphthous ulcers. Oropharynx- No Discharge or Erythema. Tonsils: Characteristics- Bilateral- No Erythema or Congestion. Size/Enlargement- Bilateral- No enlargement. Discharge- bilateral-None.  Neck Neck- Supple. No Masses.   Chest and Lung Exam Auscultation: Breath Sounds:-Clear even and unlabored.  Cardiovascular Auscultation:Rythm- Regular, rate and rhythm. Murmurs & Other  Heart Sounds:Ausculatation of the heart reveal- No Murmurs.  Lymphatic Head & Neck General Head & Neck Lymphatics: Bilateral: Description- No Localized  lymphadenopathy.      Assessment & Plan:  You appear to have bronchitis with some sinus pressure. Rest hydrate and tylenol for fever. For cough medication can take 2 of your 100 mg benzonatate tabs if need , and will prescribe doxycyline  antibiotic. For your nasal congestion use your nasonex.   Will put in future order to get cxr if your symptoms don't clear with above treatment.   Follow up in 7-10 days or as needed  Ayodele Hartsock, Percell Miller, [employer]

## 2016-05-01 ENCOUNTER — Telehealth: Payer: Self-pay | Admitting: Family Medicine

## 2016-05-01 ENCOUNTER — Ambulatory Visit (HOSPITAL_BASED_OUTPATIENT_CLINIC_OR_DEPARTMENT_OTHER)
Admission: RE | Admit: 2016-05-01 | Discharge: 2016-05-01 | Disposition: A | Discharge status: Home / Self Care | Payer: Medicare HMO | Source: Ambulatory Visit | Attending: Medical | Admitting: Medical

## 2016-05-01 DIAGNOSIS — R059 Cough, unspecified: Secondary | ICD-10-CM

## 2016-05-01 DIAGNOSIS — R05 Cough: Secondary | ICD-10-CM | POA: Diagnosis not present

## 2016-05-01 DIAGNOSIS — J209 Acute bronchitis, unspecified: Secondary | ICD-10-CM | POA: Insufficient documentation

## 2016-05-01 DIAGNOSIS — C50911 Malignant neoplasm of unspecified site of right female breast: Secondary | ICD-10-CM | POA: Diagnosis not present

## 2016-05-01 DIAGNOSIS — I700 Atherosclerosis of aorta: Secondary | ICD-10-CM | POA: Insufficient documentation

## 2016-05-01 DIAGNOSIS — Z9011 Acquired absence of right breast and nipple: Secondary | ICD-10-CM | POA: Diagnosis not present

## 2016-05-01 NOTE — Telephone Encounter (Signed)
Pt dropped off copies of Power of Attorney for PCP to verify and have on file. (4 pages with front and back). Document put at front office tray.

## 2016-05-02 ENCOUNTER — Telehealth: Payer: Self-pay | Admitting: *Deleted

## 2016-05-02 NOTE — Telephone Encounter (Signed)
Patient informed, understood & agreed/SLS 01/12  DG Chest 2 View (Accession [id number]) (Order FN:[id number])  Imaging  Date: 05/01/2016 Department: [place] Released By: Carlyon Prows Best Authorizing: Mackie Pai, PA-C  Exam Information  Status Exam Begun  Exam Ended   Final [99] 05/01/2016 1:03 PM 05/01/2016 1:09 PM  PACS Images   Show images for DG Chest 2 View  Study Result   CLINICAL DATA:  Two weeks of cough.  EXAM: CHEST  2 VIEW  COMPARISON:  PA and lateral chest x-ray of May 24, 2015  FINDINGS: The lungs are adequately inflated. There is no focal infiltrate. There is no pleural effusion. The heart and pulmonary vascularity are normal. There is calcification in the wall of the aortic arch. The patient has undergone previous right breast surgery with axillary lymph node dissection. The bony thorax exhibits no acute abnormality.  IMPRESSION: There is no pneumonia nor other acute cardiopulmonary disease.  Thoracic aortic atherosclerosis.   Electronically Signed   By: David  Martinique M.D.   On: 05/01/2016 13:15   Result Notes   Notes Recorded by Rudene Anda, RN on 05/01/2016 at 3:45 PM EST Called and left a message for call back. ------  Notes Recorded by Mackie Pai, PA-C on 05/01/2016 at 2:18 PM EST No pneumonia by cxr but treating for pneumonia so take meds prescribed the other day.      Vitals   Height Weight BMI (Calculated)  5\' 5"  (1.651 m) 177 lb 8 oz (80.5 kg) 29.6  External Result Report   External Result Report  Imaging   Imaging Information  Patient Result Comments   Written by Mackie Pai, PA-C on 05/01/2016 2:18 PM  No pneumonia by cxr but treating for pneumonia so take meds prescribed the other day.Hope you are feeling better.  Signed by   Signed Date/Time  Phone Pager  Martinique, DAVID A 05/01/2016 1:15 PM [phone]   Result Notes   Notes Recorded by Rudene Anda, RN on  05/01/2016 at 3:45 PM EST Called and left a message for call back. ------  Notes Recorded by Mackie Pai, PA-C on 05/01/2016 at 2:18 PM EST No pneumonia by cxr but treating for pneumonia so take meds prescribed the other day.      Signed  Electronically signed by David A Martinique, MD on 05/01/16 at 27 EST  Study Notes    Cristela Felt on 05/01/2016 1:09 PM  Cough x2wks.    Original Order  Ordered On Ordered By   04/28/2016 4:42 PM Mackie Pai, PA-C

## 2016-05-12 DIAGNOSIS — M25571 Pain in right ankle and joints of right foot: Secondary | ICD-10-CM | POA: Diagnosis not present

## 2016-05-18 ENCOUNTER — Other Ambulatory Visit: Payer: Self-pay | Admitting: Family Medicine

## 2016-05-27 ENCOUNTER — Telehealth: Payer: Self-pay | Admitting: Family Medicine

## 2016-05-27 NOTE — Telephone Encounter (Signed)
[employer] phone number is [phone] [place]) Plan limits are #30 for 30 days of the Atorvastatin.. They need to know it MD is ok with this or needs medical reason to document for the amount she is currently prescribed and whatever that amount is.Marland Kitchen

## 2016-05-27 NOTE — Telephone Encounter (Signed)
Her cholesterol was not contolled well enough at 1 tab daily but she is sensitive to meds so we dis not want to double the dose. Now she just takes a higher dose several days a week to help minimize side effects and treat her cholesterol well.

## 2016-05-27 NOTE — Telephone Encounter (Signed)
Received PA  Approval for Avapro from 04/19/2016  throught 04/20/2017.  Patient/pharmacy is aware.  Letter sent to scan

## 2016-05-28 DIAGNOSIS — E119 Type 2 diabetes mellitus without complications: Secondary | ICD-10-CM | POA: Diagnosis not present

## 2016-06-02 ENCOUNTER — Telehealth: Payer: Self-pay | Admitting: Family Medicine

## 2016-06-02 ENCOUNTER — Emergency Department (INDEPENDENT_AMBULATORY_CARE_PROVIDER_SITE_OTHER)
Admission: EM | Admit: 2016-06-02 | Discharge: 2016-06-02 | Disposition: A | Discharge status: Home / Self Care | Payer: Medicare HMO | Source: Home / Self Care | Attending: Family Medicine | Admitting: Family Medicine

## 2016-06-02 ENCOUNTER — Encounter: Payer: Self-pay | Admitting: *Deleted

## 2016-06-02 DIAGNOSIS — R358 Other polyuria: Secondary | ICD-10-CM

## 2016-06-02 DIAGNOSIS — R3589 Other polyuria: Secondary | ICD-10-CM

## 2016-06-02 DIAGNOSIS — N3090 Cystitis, unspecified without hematuria: Secondary | ICD-10-CM

## 2016-06-02 LAB — POCT URINALYSIS DIP (MANUAL ENTRY)
Bilirubin, UA: NEGATIVE
Blood, UA: NEGATIVE
Glucose, UA: NEGATIVE
Ketones, POC UA: NEGATIVE
Nitrite, UA: NEGATIVE
Protein Ur, POC: NEGATIVE
Spec Grav, UA: 1.01 (ref 1.005–1.03)
Urobilinogen, UA: 0.2 (ref 0–1)
pH, UA: 6 (ref 5–8)

## 2016-06-02 MED ORDER — CEPHALEXIN 500 MG PO CAPS: 500.0000 mg | ORAL_CAPSULE | Freq: Two times a day (BID) | ORAL | 0 refills | Status: DC

## 2016-06-02 MED ORDER — FLUCONAZOLE 150 MG PO TABS: 150.0000 mg | ORAL_TABLET | Freq: Once | ORAL | 0 refills | Status: AC

## 2016-06-02 NOTE — ED Triage Notes (Signed)
Patient c/o 2 weeks of bladder pressure, polyuria and itching. She took [place] she already had x 3 without resolve. She was recently on 2 rounds of antibiotics.

## 2016-06-02 NOTE — Telephone Encounter (Signed)
From [facility] received PA  Approval for Nexium  From 04/19/2016  Through 04/20/2017

## 2016-06-02 NOTE — ED Provider Notes (Signed)
[facility]    CSN: [id number] Arrival date & time: 06/02/16  1640     History   Chief Complaint Chief Complaint  Patient presents with  . Polyuria  . Bladder pressure    HPI Victoria Walker is a 66 y.o. female.   Patient reports that she was prescribed azithromycin several weeks ago for sinusitis.  She subsequently took two courses of Diflucan.  She now complains of dysuria for one week.  No abdominal pain or fever.   The history is provided by the patient.  Dysuria  Pain quality:  Burning Pain severity:  Mild Onset quality:  Gradual Duration:  1 week Timing:  Constant Progression:  Unchanged Chronicity:  New Recent urinary tract infections: no   Relieved by:  None tried Worsened by:  Nothing Ineffective treatments:  None tried Urinary symptoms: frequent urination and hesitancy   Urinary symptoms: no discolored urine, no foul-smelling urine, no hematuria and no bladder incontinence   Associated symptoms: no abdominal pain, no fever, no flank pain, no genital lesions, no nausea, no vaginal discharge and no vomiting     Past Medical History:  Diagnosis Date  . Arthritis    "both knees" (07/02/2015)  . Cancer of right breast ([place])   . Claustrophobia   . Dysrhythmia    HX OF FAST HEART RATE AND PALPITATIONS - METOPROLOL HAS HELPED  . GERD (gastroesophageal reflux disease)   . H/O hiatal hernia   . H/O iritis    LEFT EYE - STATES HER EYE BECOMES RED AND VERY SENSITIVE TO LIGHT WHEN FLARE UP OF IRITIS  . Heart murmur    "benign; 06/2015"  . High cholesterol   . History of blood transfusion    "when I had my partial hysterectomy"  . History of chickenpox 09/26/2014  . History of measles   . History of MRSA infection   . History of shingles   . Hypertension   . Hypothyroidism   . Medicare annual wellness visit, initial 04/07/2015  . Multinodular thyroid    PT HAVING TROUBLE SWALLOWING  . Neuropathy ([place])    FEET  . Primary localized  osteoarthritis of right knee   . Spinal headache    spinal headache with epidural  . Type II diabetes mellitus ([facility])    ORAL MEDICATION - NO INSULIN  . Wears glasses     Patient Active Problem List   Diagnosis Date Noted  . Primary localized osteoarthritis of right knee   . History of MRSA infection   . History of measles   . Dysrhythmia   . Chronic rhinitis 06/06/2015  . Shortness of breath 06/06/2015  . Vaginitis and vulvovaginitis 04/25/2015  . Lumbar back pain 04/25/2015  . Medicare annual wellness visit, initial 04/07/2015  . Allergic response 01/01/2015  . History of chickenpox 09/26/2014  . History of shingles   . Diabetes mellitus without complication ([place]) [id number]  . 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 Deformity of metatarsal bone of left foot 03/03/2014  . Vulvar irritation  S/P neg vulvar bx 12/2013 01/23/2014  . 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 Right knee DJD 07/12/2013  . Neuralgia of left foot 06/24/2013  . Tendonitis of ankle, right 04/08/2013  . Vaginal dryness, menopausal 04/05/2013  . Pronation deformity of ankle, acquired 02/09/2013  . Cortical cataract 02/01/2013  . H/O bone density study 01/26/2013  . Chest pain 01/05/2013  . History of iritis 01/04/2013  . Dry  eye syndrome 10/07/2012  . Meibomian gland disease 10/07/2012  . GERD (gastroesophageal reflux disease) 10/05/2012  . Essential hypertension, benign 10/04/2012  . Hyperlipidemia, mixed 10/04/2012  . 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 Diabetic neuropathy ([place]) 10/04/2012  . S/P hysterectomy 10/04/2012  . Osteopenia 10/04/2012  . Hypothyroidism 10/04/2012  . History of cystocele 10/04/2012  . Dyspareunia in female 10/04/2012  . Plantar fasciitis 10/04/2012  . Nuclear sclerotic cataract 06/17/2012  . Diabetes mellitus ([place]) 03/02/2012  . Iritis 01/06/2012  . Neuropathy ([place]) 01/06/2012  . Disease of thyroid gland 01/06/2012  .  Breast cancer ([place]) 05/06/2011    Past Surgical History:  Procedure Laterality Date  . ABDOMINAL HYSTERECTOMY  1988  . ABDOMINOPLASTY  1986  . ANTERIOR AND POSTERIOR REPAIR  05/11/2012   Procedure: ANTERIOR (CYSTOCELE) AND POSTERIOR REPAIR (RECTOCELE);  Surgeon: Reece Packer, MD;  Location: [facility];  Service: Urology;;  with graft  . BREAST BIOPSY Right   . CARDIAC CATHETERIZATION  1999  . COLONOSCOPY    . CYSTOSCOPY WITH URETHRAL DILATATION  05/11/2012   Procedure: CYSTOSCOPY WITH URETHRAL DILATATION;  Surgeon: Reece Packer, MD;  Location: [facility];  Service: Urology;;  . ESOPHAGOGASTRODUODENOSCOPY    . HERNIA REPAIR    . MASTECTOMY Right 2007  . PLANTAR FASCIA SURGERY Left   . THYROIDECTOMY N/A 02/23/2014   Procedure: TOTAL THYROIDECTOMY;  Surgeon: Armandina Gemma, MD;  Location: [facility];  Service: General;  Laterality: N/A;  . TOTAL KNEE ARTHROPLASTY Right 07/02/2015  . TOTAL KNEE ARTHROPLASTY Right 07/02/2015   Procedure: TOTAL KNEE ARTHROPLASTY;  Surgeon: Elsie Saas, MD;  Location: [facility];  Service: Orthopedics;  Laterality: Right;  . TOTAL THYROIDECTOMY  1976   removed three tumor (2 on right; 1 on left)  . TUBAL LIGATION    . UMBILICAL HERNIA REPAIR  1986  . VAGINAL PROLAPSE REPAIR  05/11/2012   Procedure: VAGINAL VAULT SUSPENSION;  Surgeon: Reece Packer, MD;  Location: [facility];  Service: Urology;;  . [place] / PERINEUM BIOPSY     vulvar    OB History    Gravida Para Term Preterm AB Living   3 3 3     3    SAB TAB Ectopic Multiple Live Births                   Home Medications    Prior to Admission medications   Medication Sig Start Date End Date Taking? Authorizing Provider  aspirin 81 MG EC tablet Take 81 mg by mouth every other day. Swallow whole.    Historical Provider, MD  atorvastatin (LIPITOR) 20 MG tablet Take 1-2 tablets (20-40 mg total) by mouth daily at 6 PM. Take 1 tablet daily and on Saturday and Sunday take 2 tablets. Patient taking differently:  Take 20 mg by mouth daily at 6 PM. Take 2 tablets on Saturday and Sunday take 2 and 1 tablet on Mon, Tues, Wed, Thurs, Fri. 02/20/16   Mosie Lukes, MD  AVAPRO 150 MG tablet TAKE 1/2-1 TABLET (75-150 MG TOTAL) BY MOUTH DAILY 10/30/15   Mosie Lukes, MD  calcium-vitamin D (OSCAL WITH D) 250-125 MG-UNIT per tablet Take 1 tablet by mouth daily.     Historical Provider, MD  cephALEXin (KEFLEX) 500 MG capsule Take 1 capsule (500 mg total) by mouth 2 (two) times daily. 06/02/16   Kandra Nicolas, MD  Cholecalciferol (VITAMIN D) 2000 UNITS CAPS Take 1 capsule by mouth every evening.  Historical Provider, MD  Ciclopirox 1 % shampoo Apply 1 each topically every Friday.  06/09/13   Historical Provider, MD  cycloSPORINE (RESTASIS) 0.05 % ophthalmic emulsion Place 1 drop into both eyes 2 (two) times daily.    Historical Provider, MD  diltiazem (TIAZAC) 360 MG 24 hr capsule TAKE 1 CAPSULE (360 MG TOTAL) BY MOUTH EVERY MORNING. 08/22/15   Mosie Lukes, MD  esomeprazole (NEXIUM) 40 MG capsule Take 1 capsule (40 mg total) by mouth every morning. 10/30/15   Mosie Lukes, MD  fluconazole (DIFLUCAN) 150 MG tablet Take 1 tablet (150 mg total) by mouth once. May repeat in 72 hours. 06/02/16 06/02/16  Kandra Nicolas, MD  gabapentin (NEURONTIN) 300 MG capsule Take 1 capsule (300 mg total) by mouth at bedtime. 03/18/16   Mosie Lukes, MD  glucose blood (ONE TOUCH TEST STRIPS) test strip Use as directed twice daily to check blood sugar. DX code E11.9 11/07/14   Mosie Lukes, MD  levocetirizine (XYZAL) 5 MG tablet TAKE 1 TABLET (5 MG TOTAL) BY MOUTH EVERY EVENING FOR RUNNY NOSE OR ITCHING 03/18/16   Charlies Silvers, MD  levothyroxine (SYNTHROID, LEVOTHROID) 75 MCG tablet TAKE ONE TABLET MONDAY THROUGH FRIDAY AND ONE HALF TABLET SATURDAY AND SUNDAY 03/28/16   Mosie Lukes, MD  loteprednol (LOTEMAX) 0.5 % ophthalmic suspension Place 1 drop into the left eye daily as needed ("arthiritis of the eye").     Historical  Provider, MD  metFORMIN (GLUCOPHAGE-XR) 750 MG 24 hr tablet TAKE 1 TABLET IN AM AND 1 TABLET AT supper 07/04/15   Kirstin Shepperson, PA-C  metoprolol succinate (TOPROL-XL) 25 MG 24 hr tablet Take 25 mg by mouth 2 (two) times daily.     Historical Provider, MD  NASONEX 50 MCG/ACT nasal spray USE 1 TO 2 SPRAYS IN [facility] NOSTRIL DAILY FOR NASAL CONGESTION/DRAINAGE 03/18/16   Charlies Silvers, MD  polyvinyl alcohol-povidone (REFRESH) 1.4-0.6 % ophthalmic solution Place 1 drop into both eyes 2 (two) times daily.    Historical Provider, MD  Probiotic Product (PROBIOTIC DAILY PO) Take 1 capsule by mouth daily.    Historical Provider, MD  spironolactone (ALDACTONE) 25 MG tablet TAKE 1.5 TABLET (37.5) BY MOUTH daily 02/20/16   Mosie Lukes, MD    Family History Family History  Problem Relation Age of Onset  . Hypertension Mother   . Heart disease Mother     pacer, CHF  . Hyperlipidemia Mother   . Kidney disease Mother     1 kidney due to stone  . Kidney Stones Mother   . Hypertension Father   . Cancer Father     lung, psa  . Diabetes Father   . Alcohol abuse Father   . Diabetes Sister   . Cancer Sister   . Hypertension Sister   . Diabetes Brother   . Hypertension Brother   . Hyperlipidemia Brother   . Kidney disease Brother     dialysis  . Stroke Brother   . Hypertension Maternal Grandmother   . Heart disease Maternal Grandmother   . Stroke Maternal Grandmother   . Hypertension Maternal Grandfather   . Hypertension Daughter   . Arthritis Son   . Hyperlipidemia Sister   . Hypertension Sister   . Diabetes Sister   . Thyroid disease Sister   . Cancer Sister     thyroid and breast  . Arthritis Sister     back pain  . Hypertension Sister   . Thyroid  disease Sister   . Hypertension Brother   . Hyperlipidemia Brother   . Hypertension Brother   . Hyperlipidemia Brother   . Benign prostatic hyperplasia Brother   . Hypertension Daughter   . Hyperlipidemia Daughter   . Diabetes  Daughter   . Hypertension Maternal Aunt   . Hypertension Maternal Uncle   . Hypertension Paternal Aunt   . Cancer Paternal Aunt   . Hypertension Paternal Uncle     Social History Social History  Substance Use Topics  . Smoking status: Never Smoker  . Smokeless tobacco: Never Used  . Alcohol use No     Allergies   Codeine; Penicillins; Sulfa antibiotics; and Vicodin [hydrocodone-acetaminophen]   Review of Systems Review of Systems  Constitutional: Negative for fever.  Gastrointestinal: Negative for abdominal pain, nausea and vomiting.  Genitourinary: Positive for dysuria. Negative for flank pain and vaginal discharge.  All other systems reviewed and are negative.    Physical Exam Triage Vital Signs ED Triage Vitals  Enc Vitals Group     BP 06/02/16 1706 132/83     Pulse Rate 06/02/16 1706 74     Resp --      Temp 06/02/16 1706 98.1 F (36.7 C)     Temp Source 06/02/16 1706 Oral     SpO2 06/02/16 1706 98 %     Weight 06/02/16 1706 178 lb (80.7 kg)     Height --      Head Circumference --      Peak Flow --      Pain Score 06/02/16 1709 0     Pain Loc --      Pain Edu? --      Excl. in [place]? --    No data found.   Updated Vital Signs BP 132/83 (BP Location: Left Arm)   Pulse 74   Temp 98.1 F (36.7 C) (Oral)   Wt 178 lb (80.7 kg)   SpO2 98%   BMI 29.62 kg/m   Visual Acuity Right Eye Distance:   Left Eye Distance:   Bilateral Distance:    Right Eye Near:   Left Eye Near:    Bilateral Near:     Physical Exam Nursing notes and Vital Signs reviewed. Appearance:  Patient appears stated age, and in no acute distress.    Eyes:  Pupils are equal, round, and reactive to light and accomodation.  Extraocular movement is intact.  Conjunctivae are not inflamed   Pharynx:  Normal; moist mucous membranes  Neck:  Supple.  No adenopathy Lungs:  Clear to auscultation.  Breath sounds are equal.  Moving air well. Heart:  Regular rate and rhythm without murmurs,  rubs, or gallops.  Abdomen:  Nontender without masses or hepatosplenomegaly.  Bowel sounds are present.  No CVA or flank tenderness.  Extremities:  No edema.  Skin:  No rash present.     UC Treatments / Results  Labs (all labs ordered are listed, but only abnormal results are displayed) Labs Reviewed  POCT URINALYSIS DIP (MANUAL ENTRY) - Abnormal; Notable for the following:       Result Value   Leukocytes, UA Trace (*)    All other components within normal limits  URINE CULTURE    EKG  EKG Interpretation None       Radiology No results found.  Procedures Procedures (including critical care time)  Medications Ordered in UC Medications - No data to display   Initial Impression / Assessment and Plan / UC Course  I have reviewed the triage vital signs and the nursing notes.  Pertinent labs & imaging results that were available during my care of the patient were reviewed by me and considered in my medical decision making (see chart for details).    Urine culture pending. Begin Keflex 500mg  BID for one week. (patient notes that she has taken Keflex without adverse effect). Rx written for Diflucan at patient's request. Increase fluid intake. If symptoms become significantly worse during the night or over the weekend, proceed to the local emergency room.  Followup with Family Doctor if not improved in one week.     Final Clinical Impressions(s) / UC Diagnoses   Final diagnoses:  Polyuria  Cystitis    New Prescriptions New Prescriptions   CEPHALEXIN (KEFLEX) 500 MG CAPSULE    Take 1 capsule (500 mg total) by mouth 2 (two) times daily.   FLUCONAZOLE (DIFLUCAN) 150 MG TABLET    Take 1 tablet (150 mg total) by mouth once. May repeat in 72 hours.     Kandra Nicolas, MD 06/02/16 [phone]

## 2016-06-02 NOTE — Discharge Instructions (Signed)
Increase fluid intake. May use non-prescription AZO for about two days, if desired, to decrease urinary discomfort.  If symptoms become significantly worse during the night or over the weekend, proceed to the local emergency room.  

## 2016-06-03 ENCOUNTER — Ambulatory Visit: Payer: Medicare HMO | Admitting: Family Medicine

## 2016-06-03 ENCOUNTER — Other Ambulatory Visit: Payer: Self-pay | Admitting: Family Medicine

## 2016-06-03 ENCOUNTER — Other Ambulatory Visit: Payer: Self-pay | Admitting: Pediatrics

## 2016-06-04 ENCOUNTER — Other Ambulatory Visit: Payer: Self-pay | Admitting: Allergy

## 2016-06-04 ENCOUNTER — Telehealth: Payer: Self-pay | Admitting: Emergency Medicine

## 2016-06-04 LAB — URINE CULTURE: Organism ID, Bacteria: NO GROWTH

## 2016-06-04 MED ORDER — LEVOCETIRIZINE DIHYDROCHLORIDE 5 MG PO TABS: ORAL_TABLET | ORAL | 0 refills | Status: DC

## 2016-06-04 NOTE — Telephone Encounter (Signed)
No growth in your urine culture, hope both of you are feeling better

## 2016-06-05 ENCOUNTER — Other Ambulatory Visit: Payer: Self-pay | Admitting: Allergy

## 2016-06-05 MED ORDER — LEVOCETIRIZINE DIHYDROCHLORIDE 5 MG PO TABS: ORAL_TABLET | ORAL | 0 refills | Status: DC

## 2016-06-09 ENCOUNTER — Ambulatory Visit: Payer: Medicare HMO | Admitting: Pediatrics

## 2016-06-09 ENCOUNTER — Encounter: Payer: Self-pay | Admitting: Pediatrics

## 2016-06-09 ENCOUNTER — Ambulatory Visit (INDEPENDENT_AMBULATORY_CARE_PROVIDER_SITE_OTHER): Payer: Medicare HMO | Admitting: Pediatrics

## 2016-06-09 VITALS — BP 120/72 | HR 76 | Temp 98.1°F | Resp 16 | Ht 65.0 in | Wt 180.0 lb

## 2016-06-09 DIAGNOSIS — J310 Chronic rhinitis: Secondary | ICD-10-CM | POA: Diagnosis not present

## 2016-06-09 DIAGNOSIS — K219 Gastro-esophageal reflux disease without esophagitis: Secondary | ICD-10-CM

## 2016-06-09 DIAGNOSIS — I10 Essential (primary) hypertension: Secondary | ICD-10-CM | POA: Diagnosis not present

## 2016-06-09 MED ORDER — MOMETASONE FUROATE 50 MCG/ACT NA SUSP: 2.0000 | Freq: Every day | NASAL | 3 refills | Status: DC

## 2016-06-09 MED ORDER — LEVOCETIRIZINE DIHYDROCHLORIDE 5 MG PO TABS: ORAL_TABLET | ORAL | 3 refills | Status: DC

## 2016-06-09 NOTE — Progress Notes (Signed)
  [place] [address] Dept: [id number]  FOLLOW UP NOTE  Patient ID: Victoria Walker, female    DOB: Aug 18, 1950  Age: 66 y.o. MRN: [id number] Date of Office Visit: 06/09/2016  Assessment  Chief Complaint: Allergies  HPI Victoria Walker presents for follow-up of allergic rhinitis. Her nasal symptoms are well controlled if she uses Nasonex 2 sprays per nostril once a day and levocetirizine 5 mg once a day. She could not tolerate other steroid nasal sprays.  Other current medications are outlined in the chart   Drug Allergies:  Allergies  Allergen Reactions  . Codeine Anxiety    Jittery   . Penicillins Itching  . Sulfa Antibiotics Itching  . Vicodin [Hydrocodone-Acetaminophen] Itching    ITCHING     Physical Exam: BP 120/72   Pulse 76   Temp 98.1 F (36.7 C) (Oral)   Resp 16   Ht 5\' 5"  (1.651 m)   Wt 180 lb (81.6 kg)   SpO2 97%   BMI 29.95 kg/m    Physical Exam  Constitutional: She is oriented to person, place, and time. She appears well-developed and well-nourished.  HENT:  Eyes normal. Ears normal. Nose normal. Pharynx normal.  Neck: Neck supple.  Cardiovascular:  S1 and S2 normal no murmurs  Pulmonary/Chest:  Clear to percussion and auscultation  Lymphadenopathy:    She has no cervical adenopathy.  Neurological: She is alert and oriented to person, place, and time.  Psychiatric: She has a normal mood and affect. Her behavior is normal. Judgment and thought content normal.  Vitals reviewed.   Diagnostics:   none Assessment and Plan: 1. Other chronic rhinitis   2. Essential hypertension   3. Gastroesophageal reflux disease without esophagitis     Meds ordered this encounter  Medications  . mometasone (NASONEX) 50 MCG/ACT nasal spray    Sig: Place 2 sprays into the nose daily.    Dispense:  51 g    Refill:  3    90 day supply  . levocetirizine (XYZAL) 5 MG tablet    Sig: TAKE 1 TABLET (5 MG TOTAL) BY MOUTH EVERY EVENING FOR  RUNNY NOSE OR ITCHING    Dispense:  90 tablet    Refill:  3    90 day supply    Patient Instructions  Continue on her current medications Call me if she's not doing well on this treatment plan   Return in about 1 year (around 06/09/2017).    Thank you for the opportunity to care for this patient.  Please do not hesitate to contact me with questions.  Penne Lash, M.D.  [facility] [address] [place], [place] [address] [phone]

## 2016-06-09 NOTE — Patient Instructions (Addendum)
Continue on her current medications Call me if she's not doing well on this treatment plan

## 2016-06-17 NOTE — Telephone Encounter (Signed)
Form for additional information received on 05/27/2016. Form completed with information provided by PCP and faxed to [place] at [phone]. Form sent for scanning. Awaiting determination.

## 2016-06-18 ENCOUNTER — Telehealth: Payer: Self-pay | Admitting: Family Medicine

## 2016-06-18 NOTE — Telephone Encounter (Signed)
Looking at atorvastatin directions I am unclear how pt should be taking prescription. I have left a message for pt to call us back and let us know how she is taking it and then will verify with Dr Charlett Blake how she wants pt to take medication.

## 2016-06-18 NOTE — Telephone Encounter (Signed)
Caller name: Missy Relationship to patient: Holland Falling Can be reached:  [place]:  Reason for call: Please call to clarify the instructions on the Lipitor.; Instructions are not clear as to what provider is requesting patient to take.

## 2016-06-18 NOTE — Telephone Encounter (Signed)
Patient called stating that her insurance company will not pay for her atorvastatin (LIPITOR) 20 MG tablet  Because it is prescribed for more than 30 per month. She does need to get a refill though as she will be out of the medication soon. Please advise  Phone: [phone]

## 2016-06-19 DIAGNOSIS — Z0100 Encounter for examination of eyes and vision without abnormal findings: Secondary | ICD-10-CM | POA: Diagnosis not present

## 2016-06-19 DIAGNOSIS — H524 Presbyopia: Secondary | ICD-10-CM | POA: Diagnosis not present

## 2016-06-19 LAB — HM DIABETES EYE EXAM

## 2016-06-19 NOTE — Telephone Encounter (Signed)
Caller name: Mitz from [place]  Call back number: [phone]    Reason for call:  Insurance would like to discuss quantity PCP will prescribe, please advise

## 2016-06-19 NOTE — Telephone Encounter (Signed)
Let's cancel the Atorvastatin 20 mg tabs and start Atorvastatin 40 mg tabs, 1/2 tab po qhs, M, W, Th, F, Sun and 1 tab po qhs Tues, and Sat. Disp 90 day supply with 1 rf. That should be most cost effective for her. Check with her first and make sure sheis ok with switching strengths and splitting a tab

## 2016-06-19 NOTE — Telephone Encounter (Signed)
Spoke to [employer] [place] and directions are very confusing.  Plan will cover #30 for 30 days or #90 for 90 days.    Advise as they need how many per month she needs so they know how many to approve??? THEY CAN APPROVE A QUANTITY LIMIT EXCEPTION BUT AS OF NOW THE INSTRUCTIONS ARE NOT CLEAR. PLEASE LET ME KNOW AND WILL LET THEM KNOW.

## 2016-06-19 NOTE — Telephone Encounter (Addendum)
5 days 1 pill daily and 2 days 2 pills daily, please advise best # [phone], insurance will approve 1 pill daily 3 month supply   [id number] - [place], [place] - [address] AT [address] [phone] (Phone) [phone] (Fax)

## 2016-06-20 MED ORDER — ATORVASTATIN CALCIUM 40 MG PO TABS: ORAL_TABLET | ORAL | 3 refills | Status: DC

## 2016-06-20 NOTE — Addendum Note (Signed)
Addended by: Sharon Seller B on: 06/20/2016 08:55 AM   Modules accepted: Orders

## 2016-06-20 NOTE — Telephone Encounter (Signed)
Called this am but too early will call back later

## 2016-06-20 NOTE — Telephone Encounter (Signed)
Updated medication list and patient has been informed of change. Spoke to [employer] and she did approve this new prescription and she did the math for a #90 day supply patient needs a #58 and she states insurance will approve this. So patient ok with this and verbalized understanding.

## 2016-07-01 ENCOUNTER — Other Ambulatory Visit (HOSPITAL_COMMUNITY)
Admission: RE | Admit: 2016-07-01 | Discharge: 2016-07-01 | Disposition: A | Discharge status: Home / Self Care | Payer: Medicare HMO | Source: Ambulatory Visit | Attending: Family Medicine | Admitting: Family Medicine

## 2016-07-01 ENCOUNTER — Encounter: Payer: Self-pay | Admitting: Family Medicine

## 2016-07-01 ENCOUNTER — Ambulatory Visit (INDEPENDENT_AMBULATORY_CARE_PROVIDER_SITE_OTHER): Payer: Medicare HMO | Admitting: Family Medicine

## 2016-07-01 VITALS — BP 118/78 | HR 62 | Temp 98.2°F | Wt 178.2 lb

## 2016-07-01 DIAGNOSIS — R300 Dysuria: Secondary | ICD-10-CM | POA: Diagnosis not present

## 2016-07-01 DIAGNOSIS — E782 Mixed hyperlipidemia: Secondary | ICD-10-CM

## 2016-07-01 DIAGNOSIS — M8580 Other specified disorders of bone density and structure, unspecified site: Secondary | ICD-10-CM

## 2016-07-01 DIAGNOSIS — E039 Hypothyroidism, unspecified: Secondary | ICD-10-CM | POA: Diagnosis not present

## 2016-07-01 DIAGNOSIS — C50919 Malignant neoplasm of unspecified site of unspecified female breast: Secondary | ICD-10-CM

## 2016-07-01 DIAGNOSIS — N761 Subacute and chronic vaginitis: Secondary | ICD-10-CM

## 2016-07-01 DIAGNOSIS — E118 Type 2 diabetes mellitus with unspecified complications: Secondary | ICD-10-CM | POA: Diagnosis not present

## 2016-07-01 DIAGNOSIS — I10 Essential (primary) hypertension: Secondary | ICD-10-CM | POA: Diagnosis not present

## 2016-07-01 NOTE — Patient Instructions (Signed)
Carbohydrate Counting for Diabetes Mellitus, Adult Carbohydrate counting is a method for keeping track of how many carbohydrates you eat. Eating carbohydrates naturally increases the amount of sugar (glucose) in the blood. Counting how many carbohydrates you eat helps keep your blood glucose within normal limits, which helps you manage your diabetes (diabetes mellitus). It is important to know how many carbohydrates you can safely have in each meal. This is different for every person. A diet and nutrition specialist (registered dietitian) can help you make a meal plan and calculate how many carbohydrates you should have at each meal and snack. Carbohydrates are found in the following foods:  Grains, such as breads and cereals.  Dried beans and soy products.  Starchy vegetables, such as potatoes, peas, and corn.  Fruit and fruit juices.  Milk and yogurt.  Sweets and snack foods, such as cake, cookies, candy, chips, and soft drinks. How do I count carbohydrates? There are two ways to count carbohydrates in food. You can use either of the methods or a combination of both. Reading "Nutrition Facts" on packaged food  The "Nutrition Facts" list is included on the labels of almost all packaged foods and beverages in the [place] It includes:  The serving size.  Information about nutrients in each serving, including the grams (g) of carbohydrate per serving. To use the "Nutrition Facts":  Decide how many servings you will have.  Multiply the number of servings by the number of carbohydrates per serving.  The resulting number is the total amount of carbohydrates that you will be having. Learning standard serving sizes of other foods  When you eat foods containing carbohydrates that are not packaged or do not include "Nutrition Facts" on the label, you need to measure the servings in order to count the amount of carbohydrates:  Measure the foods that you will eat with a food scale or measuring  cup, if needed.  Decide how many standard-size servings you will eat.  Multiply the number of servings by 15. Most carbohydrate-rich foods have about 15 g of carbohydrates per serving.  For example, if you eat 8 oz (170 g) of strawberries, you will have eaten 2 servings and 30 g of carbohydrates (2 servings x 15 g = 30 g).  For foods that have more than one food mixed, such as soups and casseroles, you must count the carbohydrates in each food that is included. The following list contains standard serving sizes of common carbohydrate-rich foods. Each of these servings has about 15 g of carbohydrates:   hamburger bun or  English muffin.   oz (15 mL) syrup.   oz (14 g) jelly.  1 slice of bread.  1 six-inch tortilla.  3 oz (85 g) cooked rice or pasta.  4 oz (113 g) cooked dried beans.  4 oz (113 g) starchy vegetable, such as peas, corn, or potatoes.  4 oz (113 g) hot cereal.  4 oz (113 g) mashed potatoes or  of a large baked potato.  4 oz (113 g) canned or frozen fruit.  4 oz (120 mL) fruit juice.  4-6 crackers.  6 chicken nuggets.  6 oz (170 g) unsweetened dry cereal.  6 oz (170 g) plain fat-free yogurt or yogurt sweetened with artificial sweeteners.  8 oz (240 mL) milk.  8 oz (170 g) fresh fruit or one small piece of fruit.  24 oz (680 g) popped popcorn. Example of carbohydrate counting Sample meal  3 oz (85 g) chicken breast.  6 oz (  170 g) brown rice.  4 oz (113 g) corn.  8 oz (240 mL) milk.  8 oz (170 g) strawberries with sugar-free whipped topping. Carbohydrate calculation 1. Identify the foods that contain carbohydrates:  Rice.  Corn.  Milk.  Strawberries. 2. Calculate how many servings you have of each food:  2 servings rice.  1 serving corn.  1 serving milk.  1 serving strawberries. 3. Multiply each number of servings by 15 g:  2 servings rice x 15 g = 30 g.  1 serving corn x 15 g = 15 g.  1 serving milk x 15 g = 15  g.  1 serving strawberries x 15 g = 15 g. 4. Add together all of the amounts to find the total grams of carbohydrates eaten:  30 g + 15 g + 15 g + 15 g = 75 g of carbohydrates total. This information is not intended to replace advice given to you by your health care provider. Make sure you discuss any questions you have with your health care provider. Document Released: 04/07/2005 Document Revised: 10/26/2015 Document Reviewed: 09/19/2015 Elsevier Interactive Patient Education  2017 [employer]  

## 2016-07-01 NOTE — Assessment & Plan Note (Signed)
hgba1c acceptable, minimize simple carbs. Increase exercise as tolerated. Continue current meds 

## 2016-07-01 NOTE — Progress Notes (Signed)
Subjective:  I acted as a Education administrator for Dr. Charlett Blake. [place], [place]   Patient ID: Victoria Walker, female    DOB: 06-22-1950, 66 y.o.   MRN: [id number]  Chief Complaint  Patient presents with  . Follow-up  . Hypertension  . Diabetes    HPI  Patient is in today for hypertension, diabetes, and other medical concerns. Patient has no acute concerns. She denies any breast complaints and her next mammogram is due in June 2018. She denies polyuria or polydipsia reports her blood sugars have been in the normal range between 70 and 150 since her last visit. No recent febrile illness or recent hospitalization. She is trying to maintain a heart healthy diet and exercise regularly. Denies CP/palp/SOB/HA/congestion/fevers/GI or GU c/o. Taking meds as prescribed  Patient Care Team: Mosie Lukes, MD as PCP - General (Family Medicine) Juanita Craver, MD as Consulting Physician (Gastroenterology) Dianna Mindi Slicker, MD (Ophthalmology) Myeong Roxine Caddy, DPM as Consulting Physician (Podiatry) Iona Beard, MD as Referring Physician (Optometry) Charlies Silvers, MD as Consulting Physician (Allergy and Immunology) Lavonia Drafts, MD as Consulting Physician (Obstetrics and Gynecology) Elsie Saas, MD as Consulting Physician (Orthopedic Surgery)   Past Medical History:  Diagnosis Date  . Arthritis    "both knees" (07/02/2015)  . Cancer of right breast ([place])   . Claustrophobia   . Dysrhythmia    HX OF FAST HEART RATE AND PALPITATIONS - METOPROLOL HAS HELPED  . GERD (gastroesophageal reflux disease)   . H/O hiatal hernia   . H/O iritis    LEFT EYE - STATES HER EYE BECOMES RED AND VERY SENSITIVE TO LIGHT WHEN FLARE UP OF IRITIS  . Heart murmur    "benign; 06/2015"  . High cholesterol   . History of blood transfusion    "when I had my partial hysterectomy"  . History of chickenpox 09/26/2014  . History of measles   . History of MRSA infection   . History of shingles   . Hypertension   .  Hypothyroidism   . Medicare annual wellness visit, initial 04/07/2015  . Multinodular thyroid    PT HAVING TROUBLE SWALLOWING  . Neuropathy ([place])    FEET  . Primary localized osteoarthritis of right knee   . Spinal headache    spinal headache with epidural  . Type II diabetes mellitus ([facility])    ORAL MEDICATION - NO INSULIN  . Wears glasses     Past Surgical History:  Procedure Laterality Date  . ABDOMINAL HYSTERECTOMY  1988  . ABDOMINOPLASTY  1986  . ANTERIOR AND POSTERIOR REPAIR  05/11/2012   Procedure: ANTERIOR (CYSTOCELE) AND POSTERIOR REPAIR (RECTOCELE);  Surgeon: Reece Packer, MD;  Location: [facility];  Service: Urology;;  with graft  . BREAST BIOPSY Right   . CARDIAC CATHETERIZATION  1999  . COLONOSCOPY    . CYSTOSCOPY WITH URETHRAL DILATATION  05/11/2012   Procedure: CYSTOSCOPY WITH URETHRAL DILATATION;  Surgeon: Reece Packer, MD;  Location: [facility];  Service: Urology;;  . ESOPHAGOGASTRODUODENOSCOPY    . HERNIA REPAIR    . MASTECTOMY Right 2007  . PLANTAR FASCIA SURGERY Left   . THYROIDECTOMY N/A 02/23/2014   Procedure: TOTAL THYROIDECTOMY;  Surgeon: Armandina Gemma, MD;  Location: [facility];  Service: General;  Laterality: N/A;  . TOTAL KNEE ARTHROPLASTY Right 07/02/2015  . TOTAL KNEE ARTHROPLASTY Right 07/02/2015   Procedure: TOTAL KNEE ARTHROPLASTY;  Surgeon: Elsie Saas, MD;  Location: [facility];  Service: Orthopedics;  Laterality: Right;  . TOTAL  THYROIDECTOMY  1976   removed three tumor (2 on right; 1 on left)  . TUBAL LIGATION    . UMBILICAL HERNIA REPAIR  1986  . VAGINAL PROLAPSE REPAIR  05/11/2012   Procedure: VAGINAL VAULT SUSPENSION;  Surgeon: Reece Packer, MD;  Location: [facility];  Service: Urology;;  . VULVA / PERINEUM BIOPSY     vulvar    Family History  Problem Relation Age of Onset  . Hypertension Mother   . Heart disease Mother     pacer, CHF  . Hyperlipidemia Mother   . Kidney disease Mother     1 kidney due to stone  . Kidney Stones Mother     . Hypertension Father   . Cancer Father     lung, psa  . Diabetes Father   . Alcohol abuse Father   . Diabetes Sister   . Cancer Sister   . Hypertension Sister   . Diabetes Brother   . Hypertension Brother   . Hyperlipidemia Brother   . Kidney disease Brother     dialysis  . Stroke Brother   . Hypertension Maternal Grandmother   . Heart disease Maternal Grandmother   . Stroke Maternal Grandmother   . Hypertension Maternal Grandfather   . Hypertension Daughter   . Arthritis Son   . Hyperlipidemia Sister   . Hypertension Sister   . Diabetes Sister   . Thyroid disease Sister   . Cancer Sister     thyroid and breast  . Arthritis Sister     back pain  . Hypertension Sister   . Thyroid disease Sister   . Hypertension Brother   . Hyperlipidemia Brother   . Hypertension Brother   . Hyperlipidemia Brother   . Benign prostatic hyperplasia Brother   . Hypertension Daughter   . Hyperlipidemia Daughter   . Diabetes Daughter   . Hypertension Maternal Aunt   . Hypertension Maternal Uncle   . Hypertension Paternal Aunt   . Cancer Paternal Aunt   . Hypertension Paternal Uncle     Social History   Social History  . Marital status: Married    Spouse name: N/A  . Number of children: 3  . Years of education: N/A   Occupational History  .      Retired   Social History Main Topics  . Smoking status: Never Smoker  . Smokeless tobacco: Never Used  . Alcohol use No  . Drug use: No  . Sexual activity: Yes    Birth control/ protection: Surgical     Comment: lives with husband, retired from various jobs, diabetic diet   Other Topics Concern  . Not on file   Social History Narrative  . No narrative on file    Outpatient Medications Prior to Visit  Medication Sig Dispense Refill  . aspirin 81 MG EC tablet Take 81 mg by mouth every other day. Swallow whole.    Marland Kitchen atorvastatin (LIPITOR) 40 MG tablet Take 1/2 tablet by mouth on Monday, Wednesday, Thursday, Friday and  Sunday, then take 1 tablet by mouth on Tuesday and Saturday. 58 tablet 3  . AVAPRO 150 MG tablet TAKE 1/2-1 TABLET (75-150 MG TOTAL) BY MOUTH DAILY 90 tablet 2  . calcium-vitamin D (OSCAL WITH D) 250-125 MG-UNIT per tablet Take 1 tablet by mouth daily.     . Cholecalciferol (VITAMIN D) 2000 UNITS CAPS Take 1 capsule by mouth every evening.     . Ciclopirox 1 % shampoo Apply 1 each topically every  Friday.     . cycloSPORINE (RESTASIS) 0.05 % ophthalmic emulsion Place 1 drop into both eyes 2 (two) times daily.    Marland Kitchen diltiazem (TIAZAC) 360 MG 24 hr capsule TAKE 1 CAPSULE (360 MG TOTAL) BY MOUTH EVERY MORNING. 90 capsule 3  . DOCOSAHEXAENOIC ACID PO Take by mouth.    . esomeprazole (NEXIUM) 40 MG capsule Take 1 capsule (40 mg total) by mouth every morning. 90 capsule 2  . gabapentin (NEURONTIN) 300 MG capsule Take 1 capsule (300 mg total) by mouth at bedtime. 270 capsule 1  . glucose blood (ONE TOUCH TEST STRIPS) test strip Use as directed twice daily to check blood sugar. DX code E11.9 300 each 1  . levocetirizine (XYZAL) 5 MG tablet TAKE 1 TABLET (5 MG TOTAL) BY MOUTH EVERY EVENING FOR RUNNY NOSE OR ITCHING 90 tablet 3  . levothyroxine (SYNTHROID, LEVOTHROID) 75 MCG tablet TAKE ONE TABLET MONDAY THROUGH FRIDAY AND ONE HALF TABLET SATURDAY AND SUNDAY 90 tablet 1  . loteprednol (LOTEMAX) 0.5 % ophthalmic suspension Place 1 drop into the left eye daily as needed ("arthiritis of the eye").     . metFORMIN (GLUCOPHAGE-XR) 750 MG 24 hr tablet TAKE 1 TABLET IN AM AND 1 TABLET AT LUNCH 180 tablet 1  . metoprolol succinate (TOPROL-XL) 25 MG 24 hr tablet TAKE 1 TABLET BY MOUTH TWICE A DAY AT 12 NOON AND 4PM 180 tablet 1  . mometasone (NASONEX) 50 MCG/ACT nasal spray Place 2 sprays into the nose daily. 51 g 3  . polyvinyl alcohol-povidone (REFRESH) 1.4-0.6 % ophthalmic solution Place 1 drop into both eyes 2 (two) times daily.    . Probiotic Product (PROBIOTIC DAILY PO) Take 1 capsule by mouth daily.    Marland Kitchen  spironolactone (ALDACTONE) 25 MG tablet TAKE 1.5 TABLET (37.5) BY MOUTH daily 180 tablet 1   No facility-administered medications prior to visit.     Allergies  Allergen Reactions  . Codeine Anxiety    Jittery   . Penicillins Itching  . Sulfa Antibiotics Itching  . Vicodin [Hydrocodone-Acetaminophen] Itching    ITCHING     Review of Systems  Constitutional: Negative for fever and malaise/fatigue.  HENT: Negative for congestion.   Eyes: Negative for blurred vision.  Respiratory: Negative for shortness of breath.   Cardiovascular: Negative for chest pain, palpitations and leg swelling.  Gastrointestinal: Negative for abdominal pain, blood in stool and nausea.  Genitourinary: Negative for dysuria and frequency.  Musculoskeletal: Negative for falls.  Skin: Negative for rash.  Neurological: Negative for dizziness, loss of consciousness and headaches.  Endo/Heme/Allergies: Negative for environmental allergies.  Psychiatric/Behavioral: Negative for depression. The patient is not nervous/anxious.        Objective:    Physical Exam  Constitutional: She is oriented to person, place, and time. She appears well-developed and well-nourished. No distress.  HENT:  Head: Normocephalic and atraumatic.  Nose: Nose normal.  Eyes: Right eye exhibits no discharge. Left eye exhibits no discharge.  Neck: Normal range of motion. Neck supple.  Cardiovascular: Normal rate and regular rhythm.   No murmur heard. Pulmonary/Chest: Effort normal and breath sounds normal.  Abdominal: Soft. Bowel sounds are normal. There is no tenderness.  Musculoskeletal: She exhibits no edema.  Neurological: She is alert and oriented to person, place, and time.  Skin: Skin is warm and dry.  Psychiatric: She has a normal mood and affect.  Nursing note and vitals reviewed.   BP 118/78 (BP Location: Left Arm, Patient Position: Sitting, Cuff Size:  Normal)   Pulse 62   Temp 98.2 F (36.8 C) (Oral)   Wt 178 lb 3.2  oz (80.8 kg)   SpO2 98%   BMI 29.65 kg/m  Wt Readings from Last 3 Encounters:  07/01/16 178 lb 3.2 oz (80.8 kg)  06/09/16 180 lb (81.6 kg)  06/02/16 178 lb (80.7 kg)     Lab Results  Component Value Date   WBC 7.6 07/01/2016   HGB 13.5 07/01/2016   HCT 40.6 07/01/2016   PLT 316.0 07/01/2016   GLUCOSE 89 07/01/2016   CHOL 183 07/01/2016   TRIG 238.0 (H) 07/01/2016   HDL 41.80 07/01/2016   LDLDIRECT 107.0 07/01/2016   LDLCALC 91 04/03/2016   ALT 21 07/01/2016   AST 20 07/01/2016   NA 140 07/01/2016   K 4.9 07/01/2016   CL 102 07/01/2016   CREATININE 0.93 07/01/2016   BUN 14 07/01/2016   CO2 29 07/01/2016   TSH 2.82 07/01/2016   INR 1.01 06/21/2015   HGBA1C 6.6 (H) 07/01/2016   MICROALBUR <0.7 12/28/2014    Lab Results  Component Value Date   TSH 2.82 07/01/2016   Lab Results  Component Value Date   WBC 7.6 07/01/2016   HGB 13.5 07/01/2016   HCT 40.6 07/01/2016   MCV 93.5 07/01/2016   PLT 316.0 07/01/2016   Lab Results  Component Value Date   NA 140 07/01/2016   K 4.9 07/01/2016   CO2 29 07/01/2016   GLUCOSE 89 07/01/2016   BUN 14 07/01/2016   CREATININE 0.93 07/01/2016   BILITOT 0.4 07/01/2016   ALKPHOS 56 07/01/2016   AST 20 07/01/2016   ALT 21 07/01/2016   PROT 7.6 07/01/2016   ALBUMIN 4.9 07/01/2016   CALCIUM 10.3 07/01/2016   ANIONGAP 9 07/04/2015   GFR 77.43 07/01/2016   Lab Results  Component Value Date   CHOL 183 07/01/2016   Lab Results  Component Value Date   HDL 41.80 07/01/2016   Lab Results  Component Value Date   LDLCALC 91 04/03/2016   Lab Results  Component Value Date   TRIG 238.0 (H) 07/01/2016   Lab Results  Component Value Date   CHOLHDL 4 07/01/2016   Lab Results  Component Value Date   HGBA1C 6.6 (H) 07/01/2016       Assessment & Plan:   Problem List Items Addressed This Visit    Breast cancer [facility])    Doing well, no complaints, next MGM due in June      Essential hypertension    Well controlled, no  changes to meds. Encouraged heart healthy diet such as the DASH diet and exercise as tolerated.       Relevant Orders   CBC (Completed)   Comprehensive metabolic panel (Completed)   TSH (Completed)   Hyperlipidemia, mixed    Encouraged heart healthy diet, increase exercise, avoid trans fats, consider a krill oil cap daily      Relevant Orders   Lipid panel (Completed)   Osteopenia    Encouraged to get adequate exercise, calcium and vitamin d intake      Hypothyroidism    On Levothyroxine, continue to monitor      Diabetes mellitus ([place])    hgba1c acceptable, minimize simple carbs. Increase exercise as tolerated. Continue current meds      Relevant Orders   Hemoglobin A1c (Completed)    Other Visit Diagnoses    Dysuria    -  Primary   Relevant Orders   Urinalysis (  Completed)   Urine culture (Completed)   Subacute vaginitis       Relevant Orders   Urine cytology ancillary only      I am having Ms. Poudrier maintain her calcium-vitamin D, loteprednol, Ciclopirox, cycloSPORINE, polyvinyl alcohol-povidone, glucose blood, Vitamin D, Probiotic Product (PROBIOTIC DAILY PO), diltiazem, aspirin, esomeprazole, AVAPRO, spironolactone, gabapentin, levothyroxine, metoprolol succinate, metFORMIN, DOCOSAHEXAENOIC ACID PO, mometasone, levocetirizine, and atorvastatin.  No orders of the defined types were placed in this encounter.   CMA served as Education administrator during this visit. History, Physical and Plan performed by medical provider. Documentation and orders reviewed and attested to.  Penni Homans, MD

## 2016-07-01 NOTE — Assessment & Plan Note (Signed)
Encouraged heart healthy diet, increase exercise, avoid trans fats, consider a krill oil cap daily 

## 2016-07-01 NOTE — Progress Notes (Signed)
Pre visit review using our clinic review tool, if applicable. No additional management support is needed unless otherwise documented below in the visit note. 

## 2016-07-01 NOTE — Assessment & Plan Note (Signed)
Well controlled, no changes to meds. Encouraged heart healthy diet such as the DASH diet and exercise as tolerated.  °

## 2016-07-01 NOTE — Assessment & Plan Note (Signed)
Encouraged to get adequate exercise, calcium and vitamin d intake 

## 2016-07-01 NOTE — Assessment & Plan Note (Signed)
On Levothyroxine, continue to monitor 

## 2016-07-02 LAB — URINALYSIS
Bilirubin Urine: NEGATIVE
Hgb urine dipstick: NEGATIVE
Ketones, ur: NEGATIVE
Leukocytes, UA: NEGATIVE
Nitrite: NEGATIVE
Specific Gravity, Urine: 1.01 (ref 1.000–1.030)
Total Protein, Urine: NEGATIVE
Urine Glucose: NEGATIVE
Urobilinogen, UA: 0.2 (ref 0.0–1.0)
pH: 6 (ref 5.0–8.0)

## 2016-07-02 LAB — LDL CHOLESTEROL, DIRECT: Direct LDL: 107 mg/dL

## 2016-07-02 LAB — COMPREHENSIVE METABOLIC PANEL
ALT: 21 U/L (ref 0–35)
AST: 20 U/L (ref 0–37)
Albumin: 4.9 g/dL (ref 3.5–5.2)
Alkaline Phosphatase: 56 U/L (ref 39–117)
BUN: 14 mg/dL (ref 6–23)
CO2: 29 mEq/L (ref 19–32)
Calcium: 10.3 mg/dL (ref 8.4–10.5)
Chloride: 102 mEq/L (ref 96–112)
Creatinine, Ser: 0.93 mg/dL (ref 0.40–1.20)
GFR: 77.43 mL/min (ref >60.00)
Glucose, Bld: 89 mg/dL (ref 70–99)
Potassium: 4.9 mEq/L (ref 3.5–5.1)
Sodium: 140 mEq/L (ref 135–145)
Total Bilirubin: 0.4 mg/dL (ref 0.2–1.2)
Total Protein: 7.6 g/dL (ref 6.0–8.3)

## 2016-07-02 LAB — CBC
HCT: 40.6 % (ref 36.0–46.0)
Hemoglobin: 13.5 g/dL (ref 12.0–15.0)
MCHC: 33.2 g/dL (ref 30.0–36.0)
MCV: 93.5 fl (ref 78.0–100.0)
Platelets: 316 10*3/uL (ref 150.0–400.0)
RBC: 4.35 Mil/uL (ref 3.87–5.11)
RDW: 13.9 % (ref 11.5–15.5)
WBC: 7.6 10*3/uL (ref 4.0–10.5)

## 2016-07-02 LAB — URINE CULTURE

## 2016-07-02 LAB — LIPID PANEL
Cholesterol: 183 mg/dL (ref 0–200)
HDL: 41.8 mg/dL (ref >39.00)
NonHDL: 140.9
Total CHOL/HDL Ratio: 4
Triglycerides: 238 mg/dL — ABNORMAL HIGH (ref 0.0–149.0)
VLDL: 47.6 mg/dL — ABNORMAL HIGH (ref 0.0–40.0)

## 2016-07-02 LAB — TSH: TSH: 2.82 u[IU]/mL (ref 0.35–4.50)

## 2016-07-02 LAB — HEMOGLOBIN A1C: Hgb A1c MFr Bld: 6.6 % — ABNORMAL HIGH (ref 4.6–6.5)

## 2016-07-03 ENCOUNTER — Other Ambulatory Visit: Payer: Self-pay | Admitting: Family Medicine

## 2016-07-03 DIAGNOSIS — R69 Illness, unspecified: Secondary | ICD-10-CM | POA: Diagnosis not present

## 2016-07-03 MED ORDER — CEFDINIR 300 MG PO CAPS: 300.0000 mg | ORAL_CAPSULE | Freq: Two times a day (BID) | ORAL | 0 refills | Status: DC

## 2016-07-04 ENCOUNTER — Other Ambulatory Visit: Payer: Self-pay | Admitting: Family Medicine

## 2016-07-04 MED ORDER — FLUCONAZOLE 150 MG PO TABS: ORAL_TABLET | ORAL | 0 refills | Status: DC

## 2016-07-06 NOTE — Assessment & Plan Note (Signed)
Doing well, no complaints, next MGM due in June

## 2016-07-07 LAB — URINE CYTOLOGY ANCILLARY ONLY
Bacterial vaginitis: NEGATIVE
Candida vaginitis: NEGATIVE

## 2016-07-11 DIAGNOSIS — L810 Postinflammatory hyperpigmentation: Secondary | ICD-10-CM | POA: Diagnosis not present

## 2016-07-11 DIAGNOSIS — L210 Seborrhea capitis: Secondary | ICD-10-CM | POA: Diagnosis not present

## 2016-07-28 ENCOUNTER — Encounter: Payer: Self-pay | Admitting: Obstetrics & Gynecology

## 2016-07-28 ENCOUNTER — Ambulatory Visit (INDEPENDENT_AMBULATORY_CARE_PROVIDER_SITE_OTHER): Payer: Medicare HMO | Admitting: Obstetrics & Gynecology

## 2016-07-28 VITALS — BP 121/78 | HR 85

## 2016-07-28 DIAGNOSIS — N952 Postmenopausal atrophic vaginitis: Secondary | ICD-10-CM | POA: Diagnosis not present

## 2016-07-28 NOTE — Progress Notes (Signed)
F/U Vulvar itching - using Vitamin E and helps with itching but can cause a UTI if it is close to the area.

## 2016-07-28 NOTE — Patient Instructions (Signed)
Atrophic Vaginitis Atrophic vaginitis is a condition in which the tissues that line the vagina become dry and thin. This condition is most common in women who have stopped having regular menstrual periods (menopause). This usually starts when a woman is 55-67 years old. Estrogen helps to keep the vagina moist. It stimulates the vagina to produce a clear fluid that lubricates the vagina for sexual intercourse. This fluid also protects the vagina from infection. Lack of estrogen can cause the lining of the vagina to get thinner and dryer. The vagina may also shrink in size. It may become less elastic. Atrophic vaginitis tends to get worse over time as a woman's estrogen level drops. What are the causes? This condition is caused by the normal drop in estrogen that happens around the time of menopause. What increases the risk? Certain conditions or situations may lower a woman's estrogen level, which increases her risk of atrophic vaginitis. These include:  Taking medicine that blocks estrogen.  Having ovaries removed surgically.  Being treated for cancer with X-ray treatment (radiation) or medicines (chemotherapy).  Exercising very hard and often.  Having an eating disorder (anorexia).  Giving birth or breastfeeding.  Being over the age of 47.  Smoking. What are the signs or symptoms? Symptoms of this condition include:  Pain, soreness, or bleeding during sexual intercourse (dyspareunia).  Vaginal burning, irritation, or itching.  Pain or bleeding during a vaginal examination using a speculum (pelvic exam).  Loss of interest in sexual activity.  Having burning pain when passing urine.  Vaginal discharge that is brown or yellow. In some cases, there are no symptoms. How is this diagnosed? This condition is diagnosed with a medical history and physical exam. This will include a pelvic exam that checks whether the inside of your vagina appears pale, thin, or dry. Rarely, you may also  have other tests, including:  A urine test.  A test that checks the acid balance in your vaginal fluid (acid balance test). How is this treated? Treatment for this condition may depend on the severity of your symptoms. Treatment may include:  Using an over-the-counter vaginal lubricant before you have sexual intercourse.  Using a long-acting vaginal moisturizer.  Using low-dose vaginal estrogen for moderate to severe symptoms that do not respond to other treatments. Options include creams, tablets, and inserts (vaginal rings). Before using vaginal estrogen, tell your health care provider if you have a history of:  Breast cancer.  Endometrial cancer.  Blood clots.  Taking medicines. You may be able to take a daily pill for dyspareunia. Discuss all of the risks of this medicine with your health care provider. It is usually not recommended for women who have a family history or personal history of breast cancer. If your symptoms are very mild and you are not sexually active, you may not need treatment. Follow these instructions at home:  Take medicines only as directed by your health care provider. Do not use herbal or alternative medicines unless your health care provider says that you can.  Use over-the-counter creams, lubricants, or moisturizers for dryness only as directed by your health care provider.  If your atrophic vaginitis is caused by menopause, discuss all of your menopausal symptoms and treatment options with your health care provider.  Do not douche.  Do not use products that can make your vagina dry. These include:  Scented feminine sprays.  Scented tampons.  Scented soaps.  If it hurts to have sex, talk with your sexual partner. Contact a health  care provider if:  Your discharge looks different than normal.  Your vagina has an unusual smell.  You have new symptoms.  Your symptoms do not improve with treatment.  Your symptoms get worse. This  information is not intended to replace advice given to you by your health care provider. Make sure you discuss any questions you have with your health care provider. Document Released: 08/22/2014 Document Revised: 09/13/2015 Document Reviewed: 03/29/2014 Elsevier Interactive Patient Education  2017 [employer].

## 2016-07-28 NOTE — Progress Notes (Signed)
History:  66 y.o. G3P3003 here today for f/u of atrophic vaginitis.  The pt reports that the Vit E suppositories have been working very well. She is using them 2-3 times per week.  She is sometimes using only 1/2 of the supp.      The following portions of the patient's history were reviewed and updated as appropriate: allergies, current medications, past family history, past medical history, past social history, past surgical history and problem list.  Review of Systems:  Pertinent items are noted in HPI.   Objective:  Physical Exam Blood pressure 121/78, pulse 85. BP 121/78   Pulse 85  CONSTITUTIONAL: Well-developed, well-nourished female in no acute distress.  HENT:  Normocephalic, atraumatic EYES: Conjunctivae and EOM are normal. No scleral icterus.  NECK: Normal range of motion SKIN: Skin is warm and dry. No rash noted. Not diaphoretic.No pallor. [place]: Alert and oriented to person, place, and time. Normal coordination.   Pelvic: Normal appearing external genitalia; normal appearing vaginal mucosa with normal Normal discharge.   Assessment & Plan:  Atrophic vaginitis- imporved with Vit E  f/u in 1 year or sooner prn    Total face-to-face time with patient was 15 min.  Greater than 50% was spent in counseling and coordination of care with the patient.   Ethie Curless L. Harraway-Smith, M.D., [place]

## 2016-08-13 ENCOUNTER — Other Ambulatory Visit: Payer: Self-pay | Admitting: Family Medicine

## 2016-08-21 ENCOUNTER — Other Ambulatory Visit: Payer: Self-pay | Admitting: Family Medicine

## 2016-08-29 DIAGNOSIS — E119 Type 2 diabetes mellitus without complications: Secondary | ICD-10-CM | POA: Diagnosis not present

## 2016-09-01 ENCOUNTER — Other Ambulatory Visit: Payer: Self-pay | Admitting: Pediatrics

## 2016-09-08 ENCOUNTER — Other Ambulatory Visit: Payer: Self-pay | Admitting: Family Medicine

## 2016-09-25 ENCOUNTER — Telehealth: Payer: Self-pay | Admitting: *Deleted

## 2016-09-25 DIAGNOSIS — Z9011 Acquired absence of right breast and nipple: Secondary | ICD-10-CM | POA: Diagnosis not present

## 2016-09-25 NOTE — Telephone Encounter (Signed)
Received Dispensing Order from [facility], forwarded to provider/SLS 06/07

## 2016-09-30 DIAGNOSIS — H40023 Open angle with borderline findings, high risk, bilateral: Secondary | ICD-10-CM | POA: Diagnosis not present

## 2016-09-30 DIAGNOSIS — H0289 Other specified disorders of eyelid: Secondary | ICD-10-CM | POA: Diagnosis not present

## 2016-09-30 DIAGNOSIS — Z8669 Personal history of other diseases of the nervous system and sense organs: Secondary | ICD-10-CM | POA: Diagnosis not present

## 2016-09-30 DIAGNOSIS — H1045 Other chronic allergic conjunctivitis: Secondary | ICD-10-CM | POA: Diagnosis not present

## 2016-09-30 DIAGNOSIS — H04123 Dry eye syndrome of bilateral lacrimal glands: Secondary | ICD-10-CM | POA: Diagnosis not present

## 2016-09-30 DIAGNOSIS — E119 Type 2 diabetes mellitus without complications: Secondary | ICD-10-CM | POA: Diagnosis not present

## 2016-10-01 ENCOUNTER — Encounter: Payer: Self-pay | Admitting: Family Medicine

## 2016-10-01 ENCOUNTER — Ambulatory Visit (INDEPENDENT_AMBULATORY_CARE_PROVIDER_SITE_OTHER): Payer: Medicare HMO | Admitting: Family Medicine

## 2016-10-01 VITALS — BP 119/82 | HR 68 | Temp 98.2°F | Resp 16 | Wt 181.4 lb

## 2016-10-01 DIAGNOSIS — J302 Other seasonal allergic rhinitis: Secondary | ICD-10-CM | POA: Diagnosis not present

## 2016-10-01 MED ORDER — MOMETASONE FUROATE 50 MCG/ACT NA SUSP: 2.0000 | Freq: Every day | NASAL | 12 refills | Status: DC

## 2016-10-01 NOTE — Progress Notes (Signed)
Chief Complaint  Patient presents with  . Facial Swelling    Pt has some swelling under both eyes,post nasal drainage and stuffy nose     [name] here for allergies.  Duration: 1 week  Associated symptoms: facial swelling under eye, facial itching, stuffy nose, itchy/watery eyes Denies: subjective fever, rhinorrhea, sore throat and shortness of breath, tongue or throat swelling Treatment to date: Xyzal 5 mg daily and generic Nasonex  ROS:  Const: Denies fevers HEENT: As noted in HPI Lungs: No SOB  Past Medical History:  Diagnosis Date  . Arthritis    "both knees" (07/02/2015)  . Cancer of right breast ([place])   . Claustrophobia   . Dysrhythmia    HX OF FAST HEART RATE AND PALPITATIONS - METOPROLOL HAS HELPED  . GERD (gastroesophageal reflux disease)   . H/O hiatal hernia   . H/O iritis    LEFT EYE - STATES HER EYE BECOMES RED AND VERY SENSITIVE TO LIGHT WHEN FLARE UP OF IRITIS  . Heart murmur    "benign; 06/2015"  . High cholesterol   . History of blood transfusion    "when I had my partial hysterectomy"  . History of chickenpox 09/26/2014  . History of measles   . History of MRSA infection   . History of shingles   . Hypertension   . Hypothyroidism   . Medicare annual wellness visit, initial 04/07/2015  . Multinodular thyroid    PT HAVING TROUBLE SWALLOWING  . Neuropathy    FEET  . Primary localized osteoarthritis of right knee   . Spinal headache    spinal headache with epidural  . Type II diabetes mellitus ([facility])    ORAL MEDICATION - NO INSULIN  . Wears glasses    Family History  Problem Relation Age of Onset  . Hypertension Mother   . Heart disease Mother        pacer, CHF  . Hyperlipidemia Mother   . Kidney disease Mother        1 kidney due to stone  . Kidney Stones Mother   . Hypertension Father   . Cancer Father        lung, psa  . Diabetes Father   . Alcohol abuse Father   . Diabetes Sister   . Cancer Sister   . Hypertension Sister   .  Diabetes Brother   . Hypertension Brother   . Hyperlipidemia Brother   . Kidney disease Brother        dialysis  . Stroke Brother   . Hypertension Maternal Grandmother   . Heart disease Maternal Grandmother   . Stroke Maternal Grandmother   . Hypertension Maternal Grandfather   . Hypertension Daughter   . Arthritis Son   . Hyperlipidemia Sister   . Hypertension Sister   . Diabetes Sister   . Thyroid disease Sister   . Cancer Sister        thyroid and breast  . Arthritis Sister        back pain  . Hypertension Sister   . Thyroid disease Sister   . Hypertension Brother   . Hyperlipidemia Brother   . Hypertension Brother   . Hyperlipidemia Brother   . Benign prostatic hyperplasia Brother   . Hypertension Daughter   . Hyperlipidemia Daughter   . Diabetes Daughter   . Hypertension Maternal Aunt   . Hypertension Maternal Uncle   . Hypertension Paternal Aunt   . Cancer Paternal Aunt   . Hypertension Paternal Uncle  BP 119/82 (Patient Position: Sitting, Cuff Size: Normal)   Pulse 68   Temp 98.2 F (36.8 C) (Oral)   Resp 16   Wt 181 lb 6.4 oz (82.3 kg)   SpO2 100%   BMI 30.19 kg/m  General: Awake, alert, appears stated age HEENT: AT, [place], ears patent b/l and TM's neg, nares patent w/o discharge, turbinates enlarged b/l, worse on L, pharynx pink and without exudates, MMM Neck: No masses or asymmetry Heart: RRR, no murmurs, no bruits Lungs: CTAB, no accessory muscle use Psych: Age appropriate judgment and insight, normal mood and affect  Seasonal allergic rhinitis, unspecified trigger - Plan: mometasone (NASONEX) 50 MCG/ACT nasal spray  Reorder brand named INCS.  No concern for anaphylaxis. F/u prn. If no improvement or worsening symptoms, will consider montelukast. Pt voiced understanding and agreement to the plan.  [place], DO 10/01/16 4:21 PM

## 2016-10-01 NOTE — Patient Instructions (Addendum)
Aim towards the same side eye when you use your nasal spray.   Let us know if you need anything.

## 2016-10-03 ENCOUNTER — Telehealth: Payer: Self-pay | Admitting: Family Medicine

## 2016-10-03 NOTE — Telephone Encounter (Signed)
Relation to VK:[facility] Call back [place]   Reason for call:  Patient was last seen 10/01/16 by Dr. Nani Ravens and patient states she's unable to pick up mometasone (NASONEX) 50 MCG/ACT nasal spray due to her filling the generic nasal spray prior and insurance will not cover due to it being to soon.    patient states she would like to schedule cortisone shot for allergies with nurse as you recommended, requesting orders, please advise

## 2016-10-03 NOTE — Telephone Encounter (Signed)
OK. Please order solumedrol 70 mg IM. TY.

## 2016-10-03 NOTE — Telephone Encounter (Signed)
Left message for patient to return call.

## 2016-10-06 ENCOUNTER — Ambulatory Visit (INDEPENDENT_AMBULATORY_CARE_PROVIDER_SITE_OTHER): Payer: Medicare HMO | Admitting: Podiatry

## 2016-10-06 VITALS — BP 145/69 | HR 71

## 2016-10-06 DIAGNOSIS — M216X9 Other acquired deformities of unspecified foot: Secondary | ICD-10-CM

## 2016-10-06 DIAGNOSIS — E1149 Type 2 diabetes mellitus with other diabetic neurological complication: Secondary | ICD-10-CM

## 2016-10-06 DIAGNOSIS — M21962 Unspecified acquired deformity of left lower leg: Secondary | ICD-10-CM

## 2016-10-06 DIAGNOSIS — M659 Synovitis and tenosynovitis, unspecified: Secondary | ICD-10-CM

## 2016-10-06 DIAGNOSIS — E1140 Type 2 diabetes mellitus with diabetic neuropathy, unspecified: Secondary | ICD-10-CM
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
## 2016-10-07 ENCOUNTER — Encounter: Payer: Self-pay | Admitting: Podiatry

## 2016-10-07 ENCOUNTER — Ambulatory Visit (INDEPENDENT_AMBULATORY_CARE_PROVIDER_SITE_OTHER): Payer: Medicare HMO | Admitting: Behavioral Health

## 2016-10-07 DIAGNOSIS — Z9011 Acquired absence of right breast and nipple: Secondary | ICD-10-CM | POA: Diagnosis not present

## 2016-10-07 DIAGNOSIS — J302 Other seasonal allergic rhinitis: Secondary | ICD-10-CM | POA: Diagnosis not present

## 2016-10-07 MED ORDER — METHYLPREDNISOLONE SODIUM SUCC 125 MG IJ SOLR
70.0000 mg | Freq: Once | INTRAMUSCULAR | Status: AC
  Administered 2016-10-07: 70 mg via INTRAMUSCULAR

## 2016-10-07 NOTE — Progress Notes (Signed)
Pre visit review using our clinic review tool, if applicable. No additional management support is needed unless otherwise documented below in the visit note.  Patient came in clinic for Solu-Medrol injection per telephone note 10/03/16. Patient tolerated injection well. No signs or symptoms of a reaction before leaving the nurse visit.

## 2016-10-07 NOTE — Patient Instructions (Signed)
Both feet measured for diabetic shoes. Will refurbish old orthotics.

## 2016-10-09 DIAGNOSIS — Z1231 Encounter for screening mammogram for malignant neoplasm of breast: Secondary | ICD-10-CM | POA: Diagnosis not present

## 2016-10-09 DIAGNOSIS — Z853 Personal history of malignant neoplasm of breast: Secondary | ICD-10-CM | POA: Diagnosis not present

## 2016-10-09 DIAGNOSIS — M810 Age-related osteoporosis without current pathological fracture: Secondary | ICD-10-CM | POA: Diagnosis not present

## 2016-10-09 LAB — HM MAMMOGRAPHY

## 2016-10-10 ENCOUNTER — Encounter: Payer: Self-pay | Admitting: Family Medicine

## 2016-10-16 ENCOUNTER — Encounter: Payer: Self-pay | Admitting: Family Medicine

## 2016-10-16 ENCOUNTER — Ambulatory Visit (INDEPENDENT_AMBULATORY_CARE_PROVIDER_SITE_OTHER): Payer: Medicare HMO | Admitting: Family Medicine

## 2016-10-16 DIAGNOSIS — I10 Essential (primary) hypertension: Secondary | ICD-10-CM | POA: Diagnosis not present

## 2016-10-16 DIAGNOSIS — E782 Mixed hyperlipidemia: Secondary | ICD-10-CM

## 2016-10-16 DIAGNOSIS — E039 Hypothyroidism, unspecified: Secondary | ICD-10-CM

## 2016-10-16 DIAGNOSIS — M21962 Unspecified acquired deformity of left lower leg: Secondary | ICD-10-CM | POA: Diagnosis not present

## 2016-10-16 DIAGNOSIS — M8580 Other specified disorders of bone density and structure, unspecified site: Secondary | ICD-10-CM | POA: Diagnosis not present

## 2016-10-16 DIAGNOSIS — E118 Type 2 diabetes mellitus with unspecified complications: Secondary | ICD-10-CM | POA: Diagnosis not present

## 2016-10-16 LAB — LIPID PANEL
Cholesterol: 170 mg/dL (ref 0–200)
HDL: 41.4 mg/dL (ref >39.00)
LDL Cholesterol: 90 mg/dL (ref 0–99)
NonHDL: 128.36
Total CHOL/HDL Ratio: 4
Triglycerides: 193 mg/dL — ABNORMAL HIGH (ref 0.0–149.0)
VLDL: 38.6 mg/dL (ref 0.0–40.0)

## 2016-10-16 LAB — COMPREHENSIVE METABOLIC PANEL
ALT: 24 U/L (ref 0–35)
AST: 20 U/L (ref 0–37)
Albumin: 4.5 g/dL (ref 3.5–5.2)
Alkaline Phosphatase: 58 U/L (ref 39–117)
BUN: 13 mg/dL (ref 6–23)
CO2: 30 mEq/L (ref 19–32)
Calcium: 9.7 mg/dL (ref 8.4–10.5)
Chloride: 104 mEq/L (ref 96–112)
Creatinine, Ser: 0.96 mg/dL (ref 0.40–1.20)
GFR: 74.58 mL/min (ref >60.00)
Glucose, Bld: 124 mg/dL — ABNORMAL HIGH (ref 70–99)
Potassium: 4.3 mEq/L (ref 3.5–5.1)
Sodium: 140 mEq/L (ref 135–145)
Total Bilirubin: 0.4 mg/dL (ref 0.2–1.2)
Total Protein: 6.8 g/dL (ref 6.0–8.3)

## 2016-10-16 LAB — HEMOGLOBIN A1C: Hgb A1c MFr Bld: 6.7 % — ABNORMAL HIGH (ref 4.6–6.5)

## 2016-10-16 LAB — CBC
HCT: 38.4 % (ref 36.0–46.0)
Hemoglobin: 12.7 g/dL (ref 12.0–15.0)
MCHC: 33.1 g/dL (ref 30.0–36.0)
MCV: 93 fl (ref 78.0–100.0)
Platelets: 277 10*3/uL (ref 150.0–400.0)
RBC: 4.13 Mil/uL (ref 3.87–5.11)
RDW: 14.1 % (ref 11.5–15.5)
WBC: 7.9 10*3/uL (ref 4.0–10.5)

## 2016-10-16 LAB — TSH: TSH: 2.53 u[IU]/mL (ref 0.35–4.50)

## 2016-10-16 NOTE — Patient Instructions (Addendum)
Lidocaine gel as needed  Tylenol/Acetaminophen ES 500 mg tabs, 2 tabs twice daily, can take a max of 6 tabs in 24 hours (3000 mg daily) safely  Shingrix shot for shingles, 2 shots to get a pharmacy   Carbohydrate Counting for Diabetes Mellitus, Adult Carbohydrate counting is a method for keeping track of how many carbohydrates you eat. Eating carbohydrates naturally increases the amount of sugar (glucose) in the blood. Counting how many carbohydrates you eat helps keep your blood glucose within normal limits, which helps you manage your diabetes (diabetes mellitus). It is important to know how many carbohydrates you can safely have in each meal. This is different for every person. A diet and nutrition specialist (registered dietitian) can help you make a meal plan and calculate how many carbohydrates you should have at each meal and snack. Carbohydrates are found in the following foods:  Grains, such as breads and cereals.  Dried beans and soy products.  Starchy vegetables, such as potatoes, peas, and corn.  Fruit and fruit juices.  Milk and yogurt.  Sweets and snack foods, such as cake, cookies, candy, chips, and soft drinks.  How do I count carbohydrates? There are two ways to count carbohydrates in food. You can use either of the methods or a combination of both. Reading "Nutrition Facts" on packaged food The "Nutrition Facts" list is included on the labels of almost all packaged foods and beverages in the [place] It includes:  The serving size.  Information about nutrients in each serving, including the grams (g) of carbohydrate per serving.  To use the "Nutrition Facts":  Decide how many servings you will have.  Multiply the number of servings by the number of carbohydrates per serving.  The resulting number is the total amount of carbohydrates that you will be having.  Learning standard serving sizes of other foods When you eat foods containing carbohydrates that are not  packaged or do not include "Nutrition Facts" on the label, you need to measure the servings in order to count the amount of carbohydrates:  Measure the foods that you will eat with a food scale or measuring cup, if needed.  Decide how many standard-size servings you will eat.  Multiply the number of servings by 15. Most carbohydrate-rich foods have about 15 g of carbohydrates per serving. ? For example, if you eat 8 oz (170 g) of strawberries, you will have eaten 2 servings and 30 g of carbohydrates (2 servings x 15 g = 30 g).  For foods that have more than one food mixed, such as soups and casseroles, you must count the carbohydrates in each food that is included.  The following list contains standard serving sizes of common carbohydrate-rich foods. Each of these servings has about 15 g of carbohydrates:   hamburger bun or  English muffin.   oz (15 mL) syrup.   oz (14 g) jelly.  1 slice of bread.  1 six-inch tortilla.  3 oz (85 g) cooked rice or pasta.  4 oz (113 g) cooked dried beans.  4 oz (113 g) starchy vegetable, such as peas, corn, or potatoes.  4 oz (113 g) hot cereal.  4 oz (113 g) mashed potatoes or  of a large baked potato.  4 oz (113 g) canned or frozen fruit.  4 oz (120 mL) fruit juice.  4-6 crackers.  6 chicken nuggets.  6 oz (170 g) unsweetened dry cereal.  6 oz (170 g) plain fat-free yogurt or yogurt sweetened with artificial  sweeteners.  8 oz (240 mL) milk.  8 oz (170 g) fresh fruit or one small piece of fruit.  24 oz (680 g) popped popcorn.  Example of carbohydrate counting Sample meal  3 oz (85 g) chicken breast.  6 oz (170 g) brown rice.  4 oz (113 g) corn.  8 oz (240 mL) milk.  8 oz (170 g) strawberries with sugar-free whipped topping. Carbohydrate calculation 1. Identify the foods that contain carbohydrates: ? Rice. ? Corn. ? Milk. ? Strawberries. 2. Calculate how many servings you have of each food: ? 2 servings  rice. ? 1 serving corn. ? 1 serving milk. ? 1 serving strawberries. 3. Multiply each number of servings by 15 g: ? 2 servings rice x 15 g = 30 g. ? 1 serving corn x 15 g = 15 g. ? 1 serving milk x 15 g = 15 g. ? 1 serving strawberries x 15 g = 15 g. 4. Add together all of the amounts to find the total grams of carbohydrates eaten: ? 30 g + 15 g + 15 g + 15 g = 75 g of carbohydrates total. This information is not intended to replace advice given to you by your health care provider. Make sure you discuss any questions you have with your health care provider. Document Released: 04/07/2005 Document Revised: 10/26/2015 Document Reviewed: 09/19/2015 Elsevier Interactive Patient Education  [employer].

## 2016-10-16 NOTE — Assessment & Plan Note (Addendum)
hgba1c acceptable, minimize simple carbs. Increase exercise as tolerated. Continue current meds. Fasting sugar this am 107, max of 130 in am. Usually between 105-110 in am.

## 2016-10-16 NOTE — Assessment & Plan Note (Signed)
On Levothyroxine, continue to monitor 

## 2016-10-16 NOTE — Progress Notes (Signed)
Subjective:  I acted as a Education administrator for Dr. Charlett Blake. [place], [place]  Patient ID: Victoria Walker, female    DOB: 17-May-1950, 66 y.o.   MRN: [id number]  No chief complaint on file.   HPI  Patient is in today for a 4 month follow up. Patient c/o her left foot being painful when she stands. She does see a foot doctor, however she states she has had problems with this foot lately. No recent febrile illness or acute hospitalizations. Denies CP/palp/SOB/HA/congestion/fevers/GI or GU c/o. Taking meds as prescribed    Patient Care Team: Mosie Lukes, MD as PCP - General (Family Medicine) Juanita Craver, MD as Consulting Physician (Gastroenterology) Sel[place], Lois Huxley, MD (Ophthalmology) Camelia Phenes, DPM as Consulting Physician (Podiatry) Iona Beard, MD as Referring Physician (Optometry) Charlies Silvers, MD as Consulting Physician (Allergy and Immunology) Lavonia Drafts, MD as Consulting Physician (Obstetrics and Gynecology) Elsie Saas, MD as Consulting Physician (Orthopedic Surgery)   Past Medical History:  Diagnosis Date  . Arthritis    "both knees" (07/02/2015)  . Cancer of right breast ([facility])   . Claustrophobia   . Dysrhythmia    HX OF FAST HEART RATE AND PALPITATIONS - METOPROLOL HAS HELPED  . GERD (gastroesophageal reflux disease)   . H/O hiatal hernia   . H/O iritis    LEFT EYE - STATES HER EYE BECOMES RED AND VERY SENSITIVE TO LIGHT WHEN FLARE UP OF IRITIS  . Heart murmur    "benign; 06/2015"  . High cholesterol   . History of blood transfusion    "when I had my partial hysterectomy"  . History of chickenpox 09/26/2014  . History of measles   . History of MRSA infection   . History of shingles   . Hypertension   . Hypothyroidism   . Medicare annual wellness visit, initial 04/07/2015  . Multinodular thyroid    PT HAVING TROUBLE SWALLOWING  . Neuropathy    FEET  . Primary localized osteoarthritis of right knee   . Spinal headache    spinal headache  with epidural  . Type II diabetes mellitus (HCC)    ORAL MEDICATION - NO INSULIN  . Wears glasses     Past Surgical History:  Procedure Laterality Date  . ABDOMINAL HYSTERECTOMY  1988  . ABDOMINOPLASTY  1986  . ANTERIOR AND POSTERIOR REPAIR  05/11/2012   Procedure: ANTERIOR (CYSTOCELE) AND POSTERIOR REPAIR (RECTOCELE);  Surgeon: Reece Packer, MD;  Location: [facility];  Service: Urology;;  with graft  . BREAST BIOPSY Right   . CARDIAC CATHETERIZATION  1999  . COLONOSCOPY    . CYSTOSCOPY WITH URETHRAL DILATATION  05/11/2012   Procedure: CYSTOSCOPY WITH URETHRAL DILATATION;  Surgeon: Reece Packer, MD;  Location: [facility];  Service: Urology;;  . ESOPHAGOGASTRODUODENOSCOPY    . HERNIA REPAIR    . MASTECTOMY Right 2007  . PLANTAR FASCIA SURGERY Left   . THYROIDECTOMY N/A 02/23/2014   Procedure: TOTAL THYROIDECTOMY;  Surgeon: Armandina Gemma, MD;  Location: [facility];  Service: General;  Laterality: N/A;  . TOTAL KNEE ARTHROPLASTY Right 07/02/2015  . TOTAL KNEE ARTHROPLASTY Right 07/02/2015   Procedure: TOTAL KNEE ARTHROPLASTY;  Surgeon: Elsie Saas, MD;  Location: [facility];  Service: Orthopedics;  Laterality: Right;  . TOTAL THYROIDECTOMY  1976   removed three tumor (2 on right; 1 on left)  . TUBAL LIGATION    . UMBILICAL HERNIA REPAIR  1986  . VAGINAL PROLAPSE REPAIR  05/11/2012   Procedure: VAGINAL VAULT  SUSPENSION;  Surgeon: Reece Packer, MD;  Location: [facility];  Service: Urology;;  . VULVA / PERINEUM BIOPSY     vulvar    Family History  Problem Relation Age of Onset  . Hypertension Mother   . Heart disease Mother        pacer, CHF  . Hyperlipidemia Mother   . Kidney disease Mother        1 kidney due to stone  . Kidney Stones Mother   . Hypertension Father   . Cancer Father        lung, psa  . Diabetes Father   . Alcohol abuse Father   . Diabetes Sister   . Cancer Sister   . Hypertension Sister   . Diabetes Brother   . Hypertension Brother   . Hyperlipidemia Brother    . Kidney disease Brother        dialysis  . Stroke Brother   . Hypertension Maternal Grandmother   . Heart disease Maternal Grandmother   . Stroke Maternal Grandmother   . Hypertension Maternal Grandfather   . Hypertension Daughter   . Arthritis Son   . Hyperlipidemia Sister   . Hypertension Sister   . Diabetes Sister   . Thyroid disease Sister   . Cancer Sister        thyroid and breast  . Arthritis Sister        back pain  . Hypertension Sister   . Thyroid disease Sister   . Hypertension Brother   . Hyperlipidemia Brother   . Hypertension Brother   . Hyperlipidemia Brother   . Benign prostatic hyperplasia Brother   . Hypertension Daughter   . Hyperlipidemia Daughter   . Diabetes Daughter   . Hypertension Maternal Aunt   . Hypertension Maternal Uncle   . Hypertension Paternal Aunt   . Cancer Paternal Aunt   . Hypertension Paternal Uncle     Social History   Social History  . Marital status: Married    Spouse name: N/A  . Number of children: 3  . Years of education: N/A   Occupational History  .      Retired   Social History Main Topics  . Smoking status: Never Smoker  . Smokeless tobacco: Never Used  . Alcohol use No  . Drug use: No  . Sexual activity: Yes    Birth control/ protection: Surgical     Comment: lives with husband, retired from various jobs, diabetic diet   Other Topics Concern  . Not on file   Social History Narrative  . No narrative on file    Outpatient Medications Prior to Visit  Medication Sig Dispense Refill  . aspirin 81 MG EC tablet Take 81 mg by mouth every other day. Swallow whole.    Marland Kitchen atorvastatin (LIPITOR) 40 MG tablet Take 1/2 tablet by mouth on Monday, Wednesday, Thursday, Friday and Sunday, then take 1 tablet by mouth on Tuesday and Saturday. 58 tablet 3  . AVAPRO 150 MG tablet TAKE 1/2-1 TAB (75-150 MG TOTAL ) BY MOUTH DAILY 90 tablet 2  . calcium-vitamin D (OSCAL WITH D) 250-125 MG-UNIT per tablet Take 1 tablet by  mouth daily.     . Cholecalciferol (VITAMIN D) 2000 UNITS CAPS Take 1 capsule by mouth every evening.     . Ciclopirox 1 % shampoo Apply 1 each topically every Friday.     . cycloSPORINE (RESTASIS) 0.05 % ophthalmic emulsion Place 1 drop into both eyes 2 (two) times  daily.    . diltiazem (TIAZAC) 360 MG 24 hr capsule TAKE 1 CAPSULE (360 MG TOTAL) BY MOUTH EVERY MORNING. 90 capsule 3  . DOCOSAHEXAENOIC ACID PO Take by mouth.    . esomeprazole (NEXIUM) 40 MG capsule TAKE ONE CAPSULE BY MOUTH IN THE MORNING 90 capsule 2  . gabapentin (NEURONTIN) 300 MG capsule Take 1 capsule (300 mg total) by mouth at bedtime. 270 capsule 1  . glucose blood (ONE TOUCH TEST STRIPS) test strip Use as directed twice daily to check blood sugar. DX code E11.9 300 each 1  . levocetirizine (XYZAL) 5 MG tablet TAKE 1 TABLET (5 MG TOTAL) BY MOUTH EVERY EVENING FOR RUNNY NOSE OR ITCHING 90 tablet 2  . levothyroxine (SYNTHROID, LEVOTHROID) 75 MCG tablet TAKE 1 TABLET BY MOUTH MONDAY THROUHG FRIDAY & 1/2 TAB ON SATURDAY & SUNDAY 90 tablet 0  . loteprednol (LOTEMAX) 0.5 % ophthalmic suspension Place 1 drop into the left eye daily as needed ("arthiritis of the eye").     . metFORMIN (GLUCOPHAGE-XR) 750 MG 24 hr tablet TAKE 1 TABLET IN AM AND 1 TABLET AT LUNCH 180 tablet 1  . metoprolol succinate (TOPROL-XL) 25 MG 24 hr tablet TAKE 1 TABLET BY MOUTH TWICE A DAY AT 12 NOON AND 4PM 180 tablet 1  . mometasone (NASONEX) 50 MCG/ACT nasal spray Place 2 sprays into the nose daily. 17 g 12  . polyvinyl alcohol-povidone (REFRESH) 1.4-0.6 % ophthalmic solution Place 1 drop into both eyes 2 (two) times daily.    . Probiotic Product (PROBIOTIC DAILY PO) Take 1 capsule by mouth daily.    Marland Kitchen spironolactone (ALDACTONE) 25 MG tablet TAEK 1 & 1/2 TAB BY MOUTH EVERY DAY 180 tablet 0  . UNABLE TO FIND Vitamin E Vaginal Suppository     No facility-administered medications prior to visit.     Allergies  Allergen Reactions  . Codeine Anxiety     Jittery   . Penicillins Itching  . Sulfa Antibiotics Itching  . Vicodin [Hydrocodone-Acetaminophen] Itching    ITCHING     Review of Systems  Constitutional: Negative for fever and malaise/fatigue.  HENT: Negative for congestion.   Eyes: Negative for blurred vision.  Respiratory: Negative for shortness of breath.   Cardiovascular: Negative for chest pain, palpitations and leg swelling.  Gastrointestinal: Negative for abdominal pain, blood in stool and nausea.  Genitourinary: Negative for dysuria and frequency.  Musculoskeletal: Positive for back pain and joint pain. Negative for falls.  Skin: Negative for rash.  Neurological: Negative for dizziness, loss of consciousness and headaches.  Endo/Heme/Allergies: Negative for environmental allergies.  Psychiatric/Behavioral: Negative for depression. The patient is not nervous/anxious.        Objective:    Physical Exam  Constitutional: She is oriented to person, place, and time. She appears well-developed and well-nourished. No distress.  HENT:  Head: Normocephalic and atraumatic.  Nose: Nose normal.  Eyes: Right eye exhibits no discharge. Left eye exhibits no discharge.  Neck: Normal range of motion. Neck supple.  Cardiovascular: Normal rate and regular rhythm.   No murmur heard. Pulmonary/Chest: Effort normal and breath sounds normal.  Abdominal: Soft. Bowel sounds are normal. There is no tenderness.  Musculoskeletal: She exhibits no edema.  Neurological: She is alert and oriented to person, place, and time.  Skin: Skin is warm and dry.  Psychiatric: She has a normal mood and affect.  Nursing note and vitals reviewed.   BP 115/60 (BP Location: Left Arm, Patient Position: Sitting, Cuff Size: Normal)  Pulse 70   Temp 97.9 F (36.6 C) (Oral)   Resp 18   Wt 179 lb 9.6 oz (81.5 kg)   SpO2 100%   BMI 29.89 kg/m  Wt Readings from Last 3 Encounters:  10/16/16 179 lb 9.6 oz (81.5 kg)  10/01/16 181 lb 6.4 oz (82.3 kg)    07/01/16 178 lb 3.2 oz (80.8 kg)   BP Readings from Last 3 Encounters:  10/16/16 115/60  10/06/16 (!) 145/69  10/01/16 119/82     Immunization History  Administered Date(s) Administered  . Influenza, High Dose Seasonal PF 01/21/2016  . Influenza,inj,Quad PF,36+ Mos 01/04/2013, 01/23/2014, 12/28/2014  . Pneumococcal Conjugate-13 12/28/2014  . Pneumococcal Polysaccharide-23 05/12/2012  . Tdap 05/02/2014    Health Maintenance  Topic Date Due  . INFLUENZA VACCINE  11/19/2016  . FOOT EXAM  02/24/2017  . HEMOGLOBIN A1C  04/17/2017  . PNA vac Low Risk Adult (2 of 2 - PPSV23) 05/12/2017  . OPHTHALMOLOGY EXAM  06/19/2017  . MAMMOGRAM  10/10/2018  . COLONOSCOPY  07/20/2021  . TETANUS/TDAP  05/02/2024  . DEXA SCAN  Completed  . Hepatitis C Screening  Completed    Lab Results  Component Value Date   WBC 7.9 10/16/2016   HGB 12.7 10/16/2016   HCT 38.4 10/16/2016   PLT 277.0 10/16/2016   GLUCOSE 124 (H) 10/16/2016   CHOL 170 10/16/2016   TRIG 193.0 (H) 10/16/2016   HDL 41.40 10/16/2016   LDLDIRECT 107.0 07/01/2016   LDLCALC 90 10/16/2016   ALT 24 10/16/2016   AST 20 10/16/2016   NA 140 10/16/2016   K 4.3 10/16/2016   CL 104 10/16/2016   CREATININE 0.96 10/16/2016   BUN 13 10/16/2016   CO2 30 10/16/2016   TSH 2.53 10/16/2016   INR 1.01 06/21/2015   HGBA1C 6.7 (H) 10/16/2016   MICROALBUR <0.7 12/28/2014    Lab Results  Component Value Date   TSH 2.53 10/16/2016   Lab Results  Component Value Date   WBC 7.9 10/16/2016   HGB 12.7 10/16/2016   HCT 38.4 10/16/2016   MCV 93.0 10/16/2016   PLT 277.0 10/16/2016   Lab Results  Component Value Date   NA 140 10/16/2016   K 4.3 10/16/2016   CO2 30 10/16/2016   GLUCOSE 124 (H) 10/16/2016   BUN 13 10/16/2016   CREATININE 0.96 10/16/2016   BILITOT 0.4 10/16/2016   ALKPHOS 58 10/16/2016   AST 20 10/16/2016   ALT 24 10/16/2016   PROT 6.8 10/16/2016   ALBUMIN 4.5 10/16/2016   CALCIUM 9.7 10/16/2016   ANIONGAP 9  07/04/2015   GFR 74.58 10/16/2016   Lab Results  Component Value Date   CHOL 170 10/16/2016   Lab Results  Component Value Date   HDL 41.40 10/16/2016   Lab Results  Component Value Date   LDLCALC 90 10/16/2016   Lab Results  Component Value Date   TRIG 193.0 (H) 10/16/2016   Lab Results  Component Value Date   CHOLHDL 4 10/16/2016   Lab Results  Component Value Date   HGBA1C 6.7 (H) 10/16/2016         Assessment & Plan:   Problem List Items Addressed This Visit    Essential hypertension    Well controlled, no changes to meds. Encouraged heart healthy diet such as the DASH diet and exercise as tolerated.       Relevant Orders   CBC (Completed)   Comprehensive metabolic panel (Completed)   Hyperlipidemia, mixed  Tolerating statin, encouraged heart healthy diet, avoid trans fats, minimize simple carbs and saturated fats. Increase exercise as tolerated      Relevant Orders   Lipid panel (Completed)   Osteopenia    Encouraged to get adequate exercise, calcium and vitamin d intake      Hypothyroidism    On Levothyroxine, continue to monitor      Relevant Orders   TSH (Completed)   Deformity of metatarsal bone of left foot    Worse pain this week after wearing heels over the weekend. Encouraged ice and lidocaine topically, Tylenol ES 500 mg tabs, 2 tabs po bid      Diabetes mellitus (HCC)    hgba1c acceptable, minimize simple carbs. Increase exercise as tolerated. Continue current meds. Fasting sugar this am 107, max of 130 in am. Usually between 105-110 in am.       Relevant Orders   Hemoglobin A1c (Completed)      I am having Ms. Kintzel maintain her calcium-vitamin D, loteprednol, Ciclopirox, cycloSPORINE, polyvinyl alcohol-povidone, glucose blood, Vitamin D, Probiotic Product (PROBIOTIC DAILY PO), aspirin, gabapentin, metoprolol succinate, metFORMIN, DOCOSAHEXAENOIC ACID PO, atorvastatin, UNABLE TO FIND, spironolactone, diltiazem, esomeprazole,  AVAPRO, levocetirizine, levothyroxine, and mometasone.  No orders of the defined types were placed in this encounter.   CMA served as Education administrator during this visit. History, Physical and Plan performed by medical provider. Documentation and orders reviewed and attested to.  Penni Homans, MD

## 2016-10-16 NOTE — Assessment & Plan Note (Signed)
Well controlled, no changes to meds. Encouraged heart healthy diet such as the DASH diet and exercise as tolerated.  °

## 2016-10-16 NOTE — Assessment & Plan Note (Signed)
Tolerating statin, encouraged heart healthy diet, avoid trans fats, minimize simple carbs and saturated fats. Increase exercise as tolerated 

## 2016-10-16 NOTE — Assessment & Plan Note (Signed)
Worse pain this week after wearing heels over the weekend. Encouraged ice and lidocaine topically, Tylenol ES 500 mg tabs, 2 tabs po bid

## 2016-10-19 NOTE — Assessment & Plan Note (Signed)
Encouraged to get adequate exercise, calcium and vitamin d intake 

## 2016-10-21 DIAGNOSIS — M7742 Metatarsalgia, left foot: Secondary | ICD-10-CM | POA: Diagnosis not present

## 2016-10-21 DIAGNOSIS — R600 Localized edema: Secondary | ICD-10-CM | POA: Diagnosis not present

## 2016-10-21 DIAGNOSIS — M84375A Stress fracture, left foot, initial encounter for fracture: Secondary | ICD-10-CM | POA: Diagnosis not present

## 2016-10-21 DIAGNOSIS — M65872 Other synovitis and tenosynovitis, left ankle and foot: Secondary | ICD-10-CM | POA: Diagnosis not present

## 2016-10-28 DIAGNOSIS — M722 Plantar fascial fibromatosis: Secondary | ICD-10-CM

## 2016-11-07 ENCOUNTER — Ambulatory Visit (INDEPENDENT_AMBULATORY_CARE_PROVIDER_SITE_OTHER): Payer: Medicare HMO | Admitting: Family

## 2016-11-07 ENCOUNTER — Encounter: Payer: Self-pay | Admitting: Family

## 2016-11-07 ENCOUNTER — Other Ambulatory Visit (HOSPITAL_COMMUNITY)
Admission: RE | Admit: 2016-11-07 | Discharge: 2016-11-07 | Disposition: A | Discharge status: Home / Self Care | Payer: Medicare HMO | Source: Ambulatory Visit | Attending: Family | Admitting: Family

## 2016-11-07 VITALS — BP 103/68 | HR 71 | Temp 98.1°F | Resp 16 | Wt 183.8 lb

## 2016-11-07 DIAGNOSIS — R350 Frequency of micturition: Secondary | ICD-10-CM

## 2016-11-07 DIAGNOSIS — N760 Acute vaginitis: Secondary | ICD-10-CM | POA: Insufficient documentation

## 2016-11-07 LAB — POC URINALSYSI DIPSTICK (AUTOMATED)
Bilirubin, UA: NEGATIVE
Blood, UA: 6
Glucose, UA: NEGATIVE
Ketones, UA: NEGATIVE
Leukocytes, UA: NEGATIVE
Nitrite, UA: NEGATIVE
Protein, UA: NEGATIVE
Spec Grav, UA: 1.025 (ref 1.010–1.025)
Urobilinogen, UA: 0.2 E.U./dL
pH, UA: 6 (ref 5.0–8.0)

## 2016-11-07 NOTE — Addendum Note (Signed)
Addended by: Kelle Darting A on: 11/07/2016 03:47 PM   Modules accepted: Orders

## 2016-11-07 NOTE — Patient Instructions (Signed)
We will send your urine for culture to make sure that you do not have a urinary tract infection. We will also send the swab to check for yeast infection and bacterial vaginosis. Please call if new/worsening symptoms or if symptoms do not improve.

## 2016-11-07 NOTE — Progress Notes (Signed)
Subjective:    Patient ID: Victoria Walker, female    DOB: [birth year], 66 y.o.   MRN: [id number]  HPI  Victoria Walker is a 66 yr old female who presents today with chief complaint of urinary frequency. She reports that symptoms have been present x 4 days. Symptoms are associated with low back pain and pelvic pain. She had similar symptoms back in March and culture was negative for UTI.   Mild vaginal itching/vulvar burning.  Denies vaginal discharge.  Review of Systems See HPI  Past Medical History:  Diagnosis Date  . Arthritis    "both knees" (07/02/2015)  . Cancer of right breast ([place])   . Claustrophobia   . Dysrhythmia    HX OF FAST HEART RATE AND PALPITATIONS - METOPROLOL HAS HELPED  . GERD (gastroesophageal reflux disease)   . H/O hiatal hernia   . H/O iritis    LEFT EYE - STATES HER EYE BECOMES RED AND VERY SENSITIVE TO LIGHT WHEN FLARE UP OF IRITIS  . Heart murmur    "benign; 06/2015"  . High cholesterol   . History of blood transfusion    "when I had my partial hysterectomy"  . History of chickenpox 09/26/2014  . History of measles   . History of MRSA infection   . History of shingles   . Hypertension   . Hypothyroidism   . Medicare annual wellness visit, initial 04/07/2015  . Multinodular thyroid    PT HAVING TROUBLE SWALLOWING  . Neuropathy    FEET  . Primary localized osteoarthritis of right knee   . Spinal headache    spinal headache with epidural  . Type II diabetes mellitus (HCC)    ORAL MEDICATION - NO INSULIN  . Wears glasses      Social History   Social History  . Marital status: Married    Spouse name: N/A  . Number of children: 3  . Years of education: N/A   Occupational History  .      Retired   Social History Main Topics  . Smoking status: Never Smoker  . Smokeless tobacco: Never Used  . Alcohol use No  . Drug use: No  . Sexual activity: Yes    Birth control/ protection: Surgical     Comment: lives with husband, retired from various  jobs, diabetic diet   Other Topics Concern  . Not on file   Social History Narrative  . No narrative on file    Past Surgical History:  Procedure Laterality Date  . ABDOMINAL HYSTERECTOMY  1988  . ABDOMINOPLASTY  1986  . ANTERIOR AND POSTERIOR REPAIR  05/11/2012   Procedure: ANTERIOR (CYSTOCELE) AND POSTERIOR REPAIR (RECTOCELE);  Surgeon: Reece Packer, MD;  Location: [facility];  Service: Urology;;  with graft  . BREAST BIOPSY Right   . CARDIAC CATHETERIZATION  1999  . COLONOSCOPY    . CYSTOSCOPY WITH URETHRAL DILATATION  05/11/2012   Procedure: CYSTOSCOPY WITH URETHRAL DILATATION;  Surgeon: Reece Packer, MD;  Location: [facility];  Service: Urology;;  . ESOPHAGOGASTRODUODENOSCOPY    . HERNIA REPAIR    . MASTECTOMY Right 2007  . PLANTAR FASCIA SURGERY Left   . THYROIDECTOMY N/A 02/23/2014   Procedure: TOTAL THYROIDECTOMY;  Surgeon: Armandina Gemma, MD;  Location: [facility];  Service: General;  Laterality: N/A;  . TOTAL KNEE ARTHROPLASTY Right 07/02/2015  . TOTAL KNEE ARTHROPLASTY Right 07/02/2015   Procedure: TOTAL KNEE ARTHROPLASTY;  Surgeon: Elsie Saas, MD;  Location: [facility];  Service:  Orthopedics;  Laterality: Right;  . TOTAL THYROIDECTOMY  1976   removed three tumor (2 on right; 1 on left)  . TUBAL LIGATION    . UMBILICAL HERNIA REPAIR  1986  . VAGINAL PROLAPSE REPAIR  05/11/2012   Procedure: VAGINAL VAULT SUSPENSION;  Surgeon: Reece Packer, MD;  Location: [facility];  Service: Urology;;  . VULVA / PERINEUM BIOPSY     vulvar    Family History  Problem Relation Age of Onset  . Hypertension Mother   . Heart disease Mother        pacer, CHF  . Hyperlipidemia Mother   . Kidney disease Mother        1 kidney due to stone  . Kidney Stones Mother   . Hypertension Father   . Cancer Father        lung, psa  . Diabetes Father   . Alcohol abuse Father   . Diabetes Sister   . Cancer Sister   . Hypertension Sister   . Diabetes Brother   . Hypertension Brother   .  Hyperlipidemia Brother   . Kidney disease Brother        dialysis  . Stroke Brother   . Hypertension Maternal Grandmother   . Heart disease Maternal Grandmother   . Stroke Maternal Grandmother   . Hypertension Maternal Grandfather   . Hypertension Daughter   . Arthritis Son   . Hyperlipidemia Sister   . Hypertension Sister   . Diabetes Sister   . Thyroid disease Sister   . Cancer Sister        thyroid and breast  . Arthritis Sister        back pain  . Hypertension Sister   . Thyroid disease Sister   . Hypertension Brother   . Hyperlipidemia Brother   . Hypertension Brother   . Hyperlipidemia Brother   . Benign prostatic hyperplasia Brother   . Hypertension Daughter   . Hyperlipidemia Daughter   . Diabetes Daughter   . Hypertension Maternal Aunt   . Hypertension Maternal Uncle   . Hypertension Paternal Aunt   . Cancer Paternal Aunt   . Hypertension Paternal Uncle     Allergies  Allergen Reactions  . Codeine Anxiety    Jittery   . Penicillins Itching  . Sulfa Antibiotics Itching  . Vicodin [Hydrocodone-Acetaminophen] Itching    ITCHING     Current Outpatient Prescriptions on File Prior to Visit  Medication Sig Dispense Refill  . aspirin 81 MG EC tablet Take 81 mg by mouth every other day. Swallow whole.    Marland Kitchen atorvastatin (LIPITOR) 40 MG tablet Take 1/2 tablet by mouth on Monday, Wednesday, Thursday, Friday and Sunday, then take 1 tablet by mouth on Tuesday and Saturday. 58 tablet 3  . AVAPRO 150 MG tablet TAKE 1/2-1 TAB (75-150 MG TOTAL ) BY MOUTH DAILY 90 tablet 2  . calcium-vitamin D (OSCAL WITH D) 250-125 MG-UNIT per tablet Take 1 tablet by mouth daily.     . Cholecalciferol (VITAMIN D) 2000 UNITS CAPS Take 1 capsule by mouth every evening.     . Ciclopirox 1 % shampoo Apply 1 each topically every Friday.     . cycloSPORINE (RESTASIS) 0.05 % ophthalmic emulsion Place 1 drop into both eyes 2 (two) times daily.    Marland Kitchen diltiazem (TIAZAC) 360 MG 24 hr capsule TAKE 1  CAPSULE (360 MG TOTAL) BY MOUTH EVERY MORNING. 90 capsule 3  . DOCOSAHEXAENOIC ACID PO Take by mouth.    Marland Kitchen  esomeprazole (NEXIUM) 40 MG capsule TAKE ONE CAPSULE BY MOUTH IN THE MORNING 90 capsule 2  . gabapentin (NEURONTIN) 300 MG capsule Take 1 capsule (300 mg total) by mouth at bedtime. 270 capsule 1  . glucose blood (ONE TOUCH TEST STRIPS) test strip Use as directed twice daily to check blood sugar. DX code E11.9 300 each 1  . levocetirizine (XYZAL) 5 MG tablet TAKE 1 TABLET (5 MG TOTAL) BY MOUTH EVERY EVENING FOR RUNNY NOSE OR ITCHING 90 tablet 2  . levothyroxine (SYNTHROID, LEVOTHROID) 75 MCG tablet TAKE 1 TABLET BY MOUTH MONDAY THROUHG FRIDAY & 1/2 TAB ON SATURDAY & SUNDAY 90 tablet 0  . loteprednol (LOTEMAX) 0.5 % ophthalmic suspension Place 1 drop into the left eye daily as needed ("arthiritis of the eye").     . metFORMIN (GLUCOPHAGE-XR) 750 MG 24 hr tablet TAKE 1 TABLET IN AM AND 1 TABLET AT LUNCH 180 tablet 1  . metoprolol succinate (TOPROL-XL) 25 MG 24 hr tablet TAKE 1 TABLET BY MOUTH TWICE A DAY AT 12 NOON AND 4PM 180 tablet 1  . mometasone (NASONEX) 50 MCG/ACT nasal spray Place 2 sprays into the nose daily. 17 g 12  . polyvinyl alcohol-povidone (REFRESH) 1.4-0.6 % ophthalmic solution Place 1 drop into both eyes 2 (two) times daily.    . Probiotic Product (PROBIOTIC DAILY PO) Take 1 capsule by mouth daily.    Marland Kitchen spironolactone (ALDACTONE) 25 MG tablet TAEK 1 & 1/2 TAB BY MOUTH EVERY DAY 180 tablet 0  . UNABLE TO FIND Vitamin E Vaginal Suppository     No current facility-administered medications on file prior to visit.     BP 103/68 (BP Location: Right Arm, Cuff Size: Large)   Pulse 71   Temp 98.1 F (36.7 C) (Oral)   Resp 16   Wt 183 lb 12.8 oz (83.4 kg)   SpO2 100%   BMI 30.59 kg/m       Objective:   Physical Exam  Constitutional: She is oriented to person, place, and time. She appears well-developed and well-nourished.  Cardiovascular: Normal rate, regular rhythm and  normal heart sounds.   No murmur heard. Pulmonary/Chest: Effort normal and breath sounds normal. No respiratory distress. She has no wheezes.  Abdominal: Soft. Bowel sounds are normal. She exhibits no distension. There is no tenderness. There is no rebound and no CVA tenderness.  Genitourinary:  Genitourinary Comments: Vaginal mucosa is dry and atrophic.  Neurological: She is alert and oriented to person, place, and time.  Psychiatric: She has a normal mood and affect. Her behavior is normal. Judgment and thought content normal.          Assessment & Plan:  Vaginitis- will send for yeast/BV. Atrophic vaginitis is another possibility but she has hx of breast CA and cannot use estrogen products. UA is clear. Will send culture to exclude infection.

## 2016-11-09 LAB — URINE CULTURE

## 2016-11-11 ENCOUNTER — Telehealth: Payer: Self-pay | Admitting: Family

## 2016-11-11 ENCOUNTER — Telehealth: Payer: Self-pay

## 2016-11-11 LAB — CERVICOVAGINAL ANCILLARY ONLY
Bacterial vaginitis: NEGATIVE
Candida vaginitis: NEGATIVE

## 2016-11-11 MED ORDER — LEVOFLOXACIN 500 MG PO TABS: 500.0000 mg | ORAL_TABLET | Freq: Every day | ORAL | 0 refills | Status: DC

## 2016-11-11 NOTE — Telephone Encounter (Signed)
See my chart message

## 2016-11-11 NOTE — Telephone Encounter (Signed)
Patient called in to follow up on her Swba results. I did not see where they have been colleted. She stated she was swabbed on Friday, by Melissa.  Please advise

## 2016-11-12 ENCOUNTER — Encounter: Payer: Self-pay | Admitting: Family

## 2016-11-12 NOTE — Telephone Encounter (Signed)
Pt notified and made aware.  She stated understanding and agreed with plan. No questions or concerns voiced.

## 2016-11-12 NOTE — Telephone Encounter (Signed)
Swab results are negative for yeast and bacteria. She does have mild uti, rx sent for antibiotic to her pharmacy.

## 2016-11-14 ENCOUNTER — Telehealth: Payer: Self-pay

## 2016-11-14 NOTE — Telephone Encounter (Signed)
Pt is scheduled for AWV in Nov 2018.

## 2016-11-14 NOTE — Telephone Encounter (Signed)
Patient is on the list for [employer] 2018 and may be a good candidate for an AWV. Please let me know if/when appt is scheduled.   

## 2016-11-18 DIAGNOSIS — E119 Type 2 diabetes mellitus without complications: Secondary | ICD-10-CM | POA: Diagnosis not present

## 2016-11-27 ENCOUNTER — Other Ambulatory Visit: Payer: Self-pay | Admitting: Family Medicine

## 2016-11-28 NOTE — Telephone Encounter (Signed)
It appears that pt never read [employer]. Could you please call her?

## 2016-12-01 NOTE — Telephone Encounter (Signed)
Attempted to reach pt by phone and left detailed message with results and to call if any questions.

## 2016-12-04 ENCOUNTER — Telehealth: Payer: Self-pay | Admitting: Family Medicine

## 2016-12-04 NOTE — Telephone Encounter (Signed)
Patient was seen 11/07/16 for UTI by Melissa. She was positive for UTI negative for bacteria and yeast. Please advise patient states her infection has not cleared. Does she need to come in or can she have another rx.    pC

## 2016-12-04 NOTE — Telephone Encounter (Signed)
Pt call in to request a refill on medication levofloxacin. She said that her infection still haven't cleared up.     Pharmacy: [id number] - [place], [place] - [address] AT [address] FROM [address]

## 2016-12-04 NOTE — Telephone Encounter (Signed)
Ok to treat with Ciprofloxacin 250 mg tabs, 1 tab po bid x 5 days if symptoms persist needs to come in for new sample

## 2016-12-05 MED ORDER — CIPROFLOXACIN HCL 250 MG PO TABS: 250.0000 mg | ORAL_TABLET | Freq: Two times a day (BID) | ORAL | 0 refills | Status: DC

## 2016-12-05 NOTE — Telephone Encounter (Signed)
Called patient left message for patient to call the office back.  Rx was sent to pharmacy.   PC

## 2016-12-09 ENCOUNTER — Other Ambulatory Visit: Payer: Self-pay | Admitting: Family Medicine

## 2016-12-10 ENCOUNTER — Ambulatory Visit (INDEPENDENT_AMBULATORY_CARE_PROVIDER_SITE_OTHER): Payer: Medicare HMO | Admitting: Obstetrics & Gynecology

## 2016-12-10 ENCOUNTER — Other Ambulatory Visit (HOSPITAL_COMMUNITY)
Admission: RE | Admit: 2016-12-10 | Discharge: 2016-12-10 | Disposition: A | Discharge status: Home / Self Care | Payer: Medicare HMO | Source: Ambulatory Visit | Attending: Obstetrics & Gynecology | Admitting: Obstetrics & Gynecology

## 2016-12-10 ENCOUNTER — Other Ambulatory Visit (HOSPITAL_COMMUNITY): Payer: Self-pay | Admitting: Obstetrics & Gynecology

## 2016-12-10 VITALS — BP 120/74 | HR 70 | Wt 181.0 lb

## 2016-12-10 DIAGNOSIS — L292 Pruritus vulvae: Secondary | ICD-10-CM | POA: Insufficient documentation

## 2016-12-10 DIAGNOSIS — R300 Dysuria: Secondary | ICD-10-CM

## 2016-12-10 LAB — POCT URINALYSIS DIPSTICK
Bilirubin, UA: NEGATIVE
Blood, UA: NEGATIVE
Glucose, UA: NEGATIVE
Ketones, UA: NEGATIVE
Nitrite, UA: NEGATIVE
Protein, UA: NEGATIVE
Spec Grav, UA: 1.01 (ref 1.010–1.025)
Urobilinogen, UA: 0.2 E.U./dL
pH, UA: 6 (ref 5.0–8.0)

## 2016-12-10 MED ORDER — ESTRADIOL 10 MCG VA TABS
ORAL_TABLET | VAGINAL | 12 refills | Status: DC
Start: 2016-12-10 — End: 2017-12-24

## 2016-12-10 NOTE — Progress Notes (Signed)
   Subjective:    Patient ID: Victoria Walker, female    DOB: 1950-11-28, 66 y.o.   MRN: [id number]  HPI  66 yo AA breast cancer survivor here with recurrent vulvovaginal irritation. She has been followed by Dr. Maylene RoesTamala Julian for years with this issue. She has used vaginal premarin, Vitamin E, Shea butter, almond oil, cocoa butter, coconut oil.  She was treated for a UTI about 2 weeks ago and prophylactically took a diflucan. She is concerned that the UTI is still present and would like a UA.  Review of Systems     Objective:   Physical Exam Well nourished, well hydrated black female, no apparent distress Breathing, conversing, and ambulating normally Vulva and vagina with atrophy, no evidence of infection, lichen No CVAT      Assessment & Plan:  Possible UTI- check UA and UC&C Symptomatic v v a- even though she did not like premarin cream, I will give her a trial of vagifem tabs PV QOD RTC 4 weeks/prn sooner

## 2016-12-10 NOTE — Progress Notes (Signed)
Pt states urinary frequency, pelvic burning- cannot distinguish vaginal or urinary. Pt states she was recently treated for UTI. Pt states that she has been using vaginal supp for dryness.

## 2016-12-11 ENCOUNTER — Ambulatory Visit: Payer: Medicare HMO | Admitting: Family Medicine

## 2016-12-12 LAB — CERVICOVAGINAL ANCILLARY ONLY
Bacterial vaginitis: NEGATIVE
Candida vaginitis: NEGATIVE

## 2016-12-13 LAB — URINE CULTURE: Organism ID, Bacteria: NO GROWTH

## 2016-12-19 ENCOUNTER — Ambulatory Visit (INDEPENDENT_AMBULATORY_CARE_PROVIDER_SITE_OTHER): Payer: Medicare HMO | Admitting: Family Medicine

## 2016-12-19 ENCOUNTER — Encounter: Payer: Self-pay | Admitting: Family Medicine

## 2016-12-19 VITALS — BP 104/78 | HR 67 | Temp 98.2°F | Ht 65.0 in | Wt 181.1 lb

## 2016-12-19 DIAGNOSIS — G5601 Carpal tunnel syndrome, right upper limb: Secondary | ICD-10-CM | POA: Diagnosis not present

## 2016-12-19 NOTE — Progress Notes (Signed)
Chief Complaint  Patient presents with  . Numbness    right hand    Subjective: Patient is a 66 y.o. female here for tingling in the R hand.  It has been going on for 1 week. It affects all fingers except for pinky. She notices it more when she makes a fist. She recently started estradiol intravaginally. It comes and goes, nothing seems to trigger it. No new weakness or worsening dropping of items. Denies injury or change in activity. Usually lasts for around 1 min. It is getting more frequent.    ROS: MSK: No pain Neuro: As noted in HPI  Family History  Problem Relation Age of Onset  . Hypertension Mother   . Heart disease Mother        pacer, CHF  . Hyperlipidemia Mother   . Kidney disease Mother        1 kidney due to stone  . Kidney Stones Mother   . Hypertension Father   . Cancer Father        lung, psa  . Diabetes Father   . Alcohol abuse Father   . Diabetes Sister   . Cancer Sister   . Hypertension Sister   . Diabetes Brother   . Hypertension Brother   . Hyperlipidemia Brother   . Kidney disease Brother        dialysis  . Stroke Brother   . Hypertension Maternal Grandmother   . Heart disease Maternal Grandmother   . Stroke Maternal Grandmother   . Hypertension Maternal Grandfather   . Hypertension Daughter   . Arthritis Son   . Hyperlipidemia Sister   . Hypertension Sister   . Diabetes Sister   . Thyroid disease Sister   . Cancer Sister        thyroid and breast  . Arthritis Sister        back pain  . Hypertension Sister   . Thyroid disease Sister   . Hypertension Brother   . Hyperlipidemia Brother   . Hypertension Brother   . Hyperlipidemia Brother   . Benign prostatic hyperplasia Brother   . Hypertension Daughter   . Hyperlipidemia Daughter   . Diabetes Daughter   . Hypertension Maternal Aunt   . Hypertension Maternal Uncle   . Hypertension Paternal Aunt   . Cancer Paternal Aunt   . Hypertension Paternal Uncle    Past Medical History:   Diagnosis Date  . Arthritis    "both knees" (07/02/2015)  . Cancer of right breast ([place])   . Claustrophobia   . Dysrhythmia    HX OF FAST HEART RATE AND PALPITATIONS - METOPROLOL HAS HELPED  . GERD (gastroesophageal reflux disease)   . H/O hiatal hernia   . H/O iritis    LEFT EYE - STATES HER EYE BECOMES RED AND VERY SENSITIVE TO LIGHT WHEN FLARE UP OF IRITIS  . Heart murmur    "benign; 06/2015"  . High cholesterol   . History of blood transfusion    "when I had my partial hysterectomy"  . History of chickenpox 09/26/2014  . History of measles   . History of MRSA infection   . History of shingles   . Hypertension   . Hypothyroidism   . Medicare annual wellness visit, initial 04/07/2015  . Multinodular thyroid    PT HAVING TROUBLE SWALLOWING  . Neuropathy    FEET  . Primary localized osteoarthritis of right knee   . Spinal headache    spinal headache with epidural  .  Type II diabetes mellitus ([facility])    ORAL MEDICATION - NO INSULIN  . Wears glasses    Allergies  Allergen Reactions  . Codeine Anxiety    Jittery   . Penicillins Itching  . Sulfa Antibiotics Itching  . Vicodin [Hydrocodone-Acetaminophen] Itching    ITCHING     Current Outpatient Prescriptions:  .  aspirin 81 MG EC tablet, Take 81 mg by mouth every other day. Swallow whole., Disp: , Rfl:  .  atorvastatin (LIPITOR) 40 MG tablet, Take 1/2 tablet by mouth on Monday, Wednesday, Thursday, Friday and Sunday, then take 1 tablet by mouth on Tuesday and Saturday., Disp: 58 tablet, Rfl: 3 .  AVAPRO 150 MG tablet, TAKE 1/2-1 TAB (75-150 MG TOTAL ) BY MOUTH DAILY, Disp: 90 tablet, Rfl: 2 .  calcium-vitamin D (OSCAL WITH D) 250-125 MG-UNIT per tablet, Take 1 tablet by mouth daily. , Disp: , Rfl:  .  Cholecalciferol (VITAMIN D) 2000 UNITS CAPS, Take 1 capsule by mouth every evening. , Disp: , Rfl:  .  Ciclopirox 1 % shampoo, Apply 1 each topically every Friday. , Disp: , Rfl:  .  cycloSPORINE (RESTASIS) 0.05 % ophthalmic  emulsion, Place 1 drop into both eyes 2 (two) times daily., Disp: , Rfl:  .  diltiazem (TIAZAC) 360 MG 24 hr capsule, TAKE 1 CAPSULE (360 MG TOTAL) BY MOUTH EVERY MORNING., Disp: 90 capsule, Rfl: 3 .  DOCOSAHEXAENOIC ACID PO, Take by mouth., Disp: , Rfl:  .  esomeprazole (NEXIUM) 40 MG capsule, TAKE ONE CAPSULE BY MOUTH IN THE MORNING, Disp: 90 capsule, Rfl: 2 .  Estradiol 10 MCG TABS vaginal tablet, 1 tablet per vagina at bedtime QOD, Disp: 30 tablet, Rfl: 12 .  gabapentin (NEURONTIN) 300 MG capsule, Take 1 capsule (300 mg total) by mouth at bedtime., Disp: 270 capsule, Rfl: 1 .  glucose blood (ONE TOUCH TEST STRIPS) test strip, Use as directed twice daily to check blood sugar. DX code E11.9, Disp: 300 each, Rfl: 1 .  levocetirizine (XYZAL) 5 MG tablet, TAKE 1 TABLET (5 MG TOTAL) BY MOUTH EVERY EVENING FOR RUNNY NOSE OR ITCHING, Disp: 90 tablet, Rfl: 2 .  levofloxacin (LEVAQUIN) 500 MG tablet, Take 1 tablet (500 mg total) by mouth daily., Disp: 3 tablet, Rfl: 0 .  levothyroxine (SYNTHROID, LEVOTHROID) 75 MCG tablet, TAKE 1 TABLET BY MOUTH MONDAY THROUHG FRIDAY & 1/2 TAB ON SATURDAY & SUNDAY, Disp: 90 tablet, Rfl: 0 .  loteprednol (LOTEMAX) 0.5 % ophthalmic suspension, Place 1 drop into the left eye daily as needed ("arthiritis of the eye"). , Disp: , Rfl:  .  metFORMIN (GLUCOPHAGE-XR) 750 MG 24 hr tablet, TAKE 1 TABLET IN AM AND 1 TABLET AT LUNCH, Disp: 180 tablet, Rfl: 1 .  metoprolol succinate (TOPROL-XL) 25 MG 24 hr tablet, TAKE 1 TABLET BY MOUTH TWICE A DAY AT 12 NOON AND 4PM, Disp: 180 tablet, Rfl: 1 .  mometasone (NASONEX) 50 MCG/ACT nasal spray, Place 2 sprays into the nose daily., Disp: 17 g, Rfl: 12 .  Olopatadine HCl 0.2 % SOLN, Place 1 drop into both eyes daily as needed., Disp: , Rfl: 11 .  polyvinyl alcohol-povidone (REFRESH) 1.4-0.6 % ophthalmic solution, Place 1 drop into both eyes 2 (two) times daily., Disp: , Rfl:  .  Probiotic Product (PROBIOTIC DAILY PO), Take 1 capsule by mouth  daily., Disp: , Rfl:  .  spironolactone (ALDACTONE) 25 MG tablet, TAEK 1 & 1/2 TAB BY MOUTH EVERY DAY, Disp: 180 tablet, Rfl: 0 .  UNABLE TO FIND, Vitamin E Vaginal Suppository, Disp: , Rfl:   Objective: BP 104/78 (BP Location: Left Arm, Patient Position: Sitting, Cuff Size: Normal)   Pulse 67   Temp 98.2 F (36.8 C) (Oral)   Ht 5\' 5"  (1.651 m)   Wt 181 lb 2 oz (82.2 kg)   SpO2 98%   BMI 30.14 kg/m  General: Awake, appears stated age HEENT: MMM, EOMi Heart: RRR, no murmurs Lungs: CTAB, no rales, wheezes or rhonchi. No accessory muscle use MSK: 5/5 strength of R wrist, grip strength, no atrophy Neuro: sensation intact to light touch, +Phalens and reverse Phalen's, neg Tinel's Psych: Age appropriate judgment and insight, normal affect and mood  Assessment and Plan: Carpal tunnel syndrome of right wrist  Orders as above. Wrist splint given. Wear at night and during aggravating activities.  The patient voiced understanding and agreement to the plan.  P[place], DO 12/19/16  2:43 PM

## 2016-12-19 NOTE — Progress Notes (Signed)
Pre visit review using our clinic review tool, if applicable. No additional management support is needed unless otherwise documented below in the visit note. 

## 2016-12-19 NOTE — Patient Instructions (Signed)
Stretch your wrist. Wear your splint at night and if you do anything that aggravates it during the day.  Let us know if you need anything.

## 2016-12-23 ENCOUNTER — Ambulatory Visit: Payer: Medicare HMO | Admitting: Family

## 2016-12-30 ENCOUNTER — Encounter: Payer: Self-pay | Admitting: Medical

## 2016-12-30 ENCOUNTER — Telehealth: Payer: Self-pay | Admitting: *Deleted

## 2016-12-30 ENCOUNTER — Ambulatory Visit (INDEPENDENT_AMBULATORY_CARE_PROVIDER_SITE_OTHER): Payer: Medicare HMO | Admitting: Medical

## 2016-12-30 VITALS — BP 111/67 | HR 72 | Temp 98.1°F | Resp 16 | Ht 65.0 in | Wt 180.8 lb

## 2016-12-30 DIAGNOSIS — E119 Type 2 diabetes mellitus without complications: Secondary | ICD-10-CM

## 2016-12-30 DIAGNOSIS — J309 Allergic rhinitis, unspecified: Secondary | ICD-10-CM

## 2016-12-30 DIAGNOSIS — R05 Cough: Secondary | ICD-10-CM

## 2016-12-30 DIAGNOSIS — J0100 Acute maxillary sinusitis, unspecified: Secondary | ICD-10-CM | POA: Diagnosis not present

## 2016-12-30 DIAGNOSIS — R350 Frequency of micturition: Secondary | ICD-10-CM | POA: Diagnosis not present

## 2016-12-30 DIAGNOSIS — R059 Cough, unspecified: Secondary | ICD-10-CM

## 2016-12-30 LAB — POC URINALSYSI DIPSTICK (AUTOMATED)
Blood, UA: NEGATIVE
Glucose, UA: NEGATIVE
Leukocytes, UA: NEGATIVE
Nitrite, UA: NEGATIVE
Protein, UA: NEGATIVE
Spec Grav, UA: >=1.03 — AB (ref 1.010–1.025)
Urobilinogen, UA: NEGATIVE E.U./dL — AB
pH, UA: 6 (ref 5.0–8.0)

## 2016-12-30 MED ORDER — AZITHROMYCIN 250 MG PO TABS: ORAL_TABLET | ORAL | 0 refills | Status: DC

## 2016-12-30 MED ORDER — BENZONATATE 100 MG PO CAPS: 100.0000 mg | ORAL_CAPSULE | Freq: Three times a day (TID) | ORAL | 0 refills | Status: DC | PRN

## 2016-12-30 NOTE — Telephone Encounter (Signed)
Faxed refill request received from [facility] for Blood Glucose testing supplies; forwarded to provider, must have signature/SLS 09/11 Last AEX - 10/16/16 Next AEX - 4-Mths

## 2016-12-30 NOTE — Progress Notes (Signed)
Subjective:    Patient ID: Victoria Walker, female    DOB: 27-Oct-1950, 66 y.o.   MRN: [id number]  HPI  Pt in with some nasal congestion for about one week. Over weekend facial pain, teeth pain and colored mucous when she blows her nose.   Some sneezing. Mild cough.  Pt is on nasonex and zyzal for hx of allergies.   Review of Systems  Constitutional: Negative for chills, fatigue and fever.  HENT: Positive for congestion, postnasal drip, sinus pain and sinus pressure. Negative for ear pain, hearing loss and nosebleeds.   Respiratory: Negative for cough, chest tightness, shortness of breath and wheezing.   Cardiovascular: Negative for chest pain and palpitations.  Gastrointestinal: Negative for abdominal pain, blood in stool, constipation, diarrhea, nausea and vomiting.  Genitourinary: Positive for frequency. Negative for decreased urine volume, difficulty urinating, dysuria, flank pain and pelvic pain.       Over weekend she had frequent urination.  Musculoskeletal: Negative for back pain.  Skin: Negative for rash.  Neurological: Negative for dizziness, seizures, speech difficulty, weakness, light-headedness, numbness and headaches.  Hematological: Negative for adenopathy. Does not bruise/bleed easily.  Psychiatric/Behavioral: Negative for behavioral problems, confusion and sleep disturbance. The patient is not nervous/anxious.     Past Medical History:  Diagnosis Date  . Arthritis    "both knees" (07/02/2015)  . Cancer of right breast ([place])   . Claustrophobia   . Dysrhythmia    HX OF FAST HEART RATE AND PALPITATIONS - METOPROLOL HAS HELPED  . GERD (gastroesophageal reflux disease)   . H/O hiatal hernia   . H/O iritis    LEFT EYE - STATES HER EYE BECOMES RED AND VERY SENSITIVE TO LIGHT WHEN FLARE UP OF IRITIS  . Heart murmur    "benign; 06/2015"  . High cholesterol   . History of blood transfusion    "when I had my partial hysterectomy"  . History of chickenpox 09/26/2014    . History of measles   . History of MRSA infection   . History of shingles   . Hypertension   . Hypothyroidism   . Medicare annual wellness visit, initial 04/07/2015  . Multinodular thyroid    PT HAVING TROUBLE SWALLOWING  . Neuropathy    FEET  . Primary localized osteoarthritis of right knee   . Spinal headache    spinal headache with epidural  . Type II diabetes mellitus ([facility])    ORAL MEDICATION - NO INSULIN  . Wears glasses      Social History   Social History  . Marital status: Married    Spouse name: N/A  . Number of children: 3  . Years of education: N/A   Occupational History  .      Retired   Social History Main Topics  . Smoking status: Never Smoker  . Smokeless tobacco: Never Used  . Alcohol use No  . Drug use: No  . Sexual activity: Yes    Birth control/ protection: Surgical     Comment: lives with husband, retired from various jobs, diabetic diet   Other Topics Concern  . Not on file   Social History Narrative  . No narrative on file    Past Surgical History:  Procedure Laterality Date  . ABDOMINAL HYSTERECTOMY  1988  . ABDOMINOPLASTY  1986  . ANTERIOR AND POSTERIOR REPAIR  05/11/2012   Procedure: ANTERIOR (CYSTOCELE) AND POSTERIOR REPAIR (RECTOCELE);  Surgeon: Reece Packer, MD;  Location: [facility];  Service: Urology;;  with graft  . BREAST BIOPSY Right   . CARDIAC CATHETERIZATION  1999  . COLONOSCOPY    . CYSTOSCOPY WITH URETHRAL DILATATION  05/11/2012   Procedure: CYSTOSCOPY WITH URETHRAL DILATATION;  Surgeon: Reece Packer, MD;  Location: [facility];  Service: Urology;;  . ESOPHAGOGASTRODUODENOSCOPY    . HERNIA REPAIR    . MASTECTOMY Right 2007  . PLANTAR FASCIA SURGERY Left   . THYROIDECTOMY N/A 02/23/2014   Procedure: TOTAL THYROIDECTOMY;  Surgeon: Armandina Gemma, MD;  Location: [facility];  Service: General;  Laterality: N/A;  . TOTAL KNEE ARTHROPLASTY Right 07/02/2015  . TOTAL KNEE ARTHROPLASTY Right 07/02/2015   Procedure: TOTAL KNEE  ARTHROPLASTY;  Surgeon: Elsie Saas, MD;  Location: [facility];  Service: Orthopedics;  Laterality: Right;  . TOTAL THYROIDECTOMY  1976   removed three tumor (2 on right; 1 on left)  . TUBAL LIGATION    . UMBILICAL HERNIA REPAIR  1986  . VAGINAL PROLAPSE REPAIR  05/11/2012   Procedure: VAGINAL VAULT SUSPENSION;  Surgeon: Reece Packer, MD;  Location: [facility];  Service: Urology;;  . VULVA / PERINEUM BIOPSY     vulvar    Family History  Problem Relation Age of Onset  . Hypertension Mother   . Heart disease Mother        pacer, CHF  . Hyperlipidemia Mother   . Kidney disease Mother        1 kidney due to stone  . Kidney Stones Mother   . Hypertension Father   . Cancer Father        lung, psa  . Diabetes Father   . Alcohol abuse Father   . Diabetes Sister   . Cancer Sister   . Hypertension Sister   . Diabetes Brother   . Hypertension Brother   . Hyperlipidemia Brother   . Kidney disease Brother        dialysis  . Stroke Brother   . Hypertension Maternal Grandmother   . Heart disease Maternal Grandmother   . Stroke Maternal Grandmother   . Hypertension Maternal Grandfather   . Hypertension Daughter   . Arthritis Son   . Hyperlipidemia Sister   . Hypertension Sister   . Diabetes Sister   . Thyroid disease Sister   . Cancer Sister        thyroid and breast  . Arthritis Sister        back pain  . Hypertension Sister   . Thyroid disease Sister   . Hypertension Brother   . Hyperlipidemia Brother   . Hypertension Brother   . Hyperlipidemia Brother   . Benign prostatic hyperplasia Brother   . Hypertension Daughter   . Hyperlipidemia Daughter   . Diabetes Daughter   . Hypertension Maternal Aunt   . Hypertension Maternal Uncle   . Hypertension Paternal Aunt   . Cancer Paternal Aunt   . Hypertension Paternal Uncle     Allergies  Allergen Reactions  . Codeine Anxiety    Jittery   . Penicillins Itching  . Sulfa Antibiotics Itching  . Vicodin  [Hydrocodone-Acetaminophen] Itching    ITCHING     Current Outpatient Prescriptions on File Prior to Visit  Medication Sig Dispense Refill  . aspirin 81 MG EC tablet Take 81 mg by mouth every other day. Swallow whole.    Marland Kitchen atorvastatin (LIPITOR) 40 MG tablet Take 1/2 tablet by mouth on Monday, Wednesday, Thursday, Friday and Sunday, then take 1 tablet by mouth on Tuesday and Saturday. [place]  tablet 3  . AVAPRO 150 MG tablet TAKE 1/2-1 TAB (75-150 MG TOTAL ) BY MOUTH DAILY 90 tablet 2  . calcium-vitamin D (OSCAL WITH D) 250-125 MG-UNIT per tablet Take 1 tablet by mouth daily.     . Cholecalciferol (VITAMIN D) 2000 UNITS CAPS Take 1 capsule by mouth every evening.     . Ciclopirox 1 % shampoo Apply 1 each topically every Friday.     . cycloSPORINE (RESTASIS) 0.05 % ophthalmic emulsion Place 1 drop into both eyes 2 (two) times daily.    Marland Kitchen diltiazem (TIAZAC) 360 MG 24 hr capsule TAKE 1 CAPSULE (360 MG TOTAL) BY MOUTH EVERY MORNING. 90 capsule 3  . DOCOSAHEXAENOIC ACID PO Take by mouth.    . esomeprazole (NEXIUM) 40 MG capsule TAKE ONE CAPSULE BY MOUTH IN THE MORNING 90 capsule 2  . Estradiol 10 MCG TABS vaginal tablet 1 tablet per vagina at bedtime QOD 30 tablet 12  . gabapentin (NEURONTIN) 300 MG capsule Take 1 capsule (300 mg total) by mouth at bedtime. 270 capsule 1  . glucose blood (ONE TOUCH TEST STRIPS) test strip Use as directed twice daily to check blood sugar. DX code E11.9 300 each 1  . levocetirizine (XYZAL) 5 MG tablet TAKE 1 TABLET (5 MG TOTAL) BY MOUTH EVERY EVENING FOR RUNNY NOSE OR ITCHING 90 tablet 2  . levofloxacin (LEVAQUIN) 500 MG tablet Take 1 tablet (500 mg total) by mouth daily. 3 tablet 0  . levothyroxine (SYNTHROID, LEVOTHROID) 75 MCG tablet TAKE 1 TABLET BY MOUTH MONDAY THROUHG FRIDAY & 1/2 TAB ON SATURDAY & SUNDAY 90 tablet 0  . loteprednol (LOTEMAX) 0.5 % ophthalmic suspension Place 1 drop into the left eye daily as needed ("arthiritis of the eye").     . metFORMIN  (GLUCOPHAGE-XR) 750 MG 24 hr tablet TAKE 1 TABLET IN AM AND 1 TABLET AT LUNCH 180 tablet 1  . metoprolol succinate (TOPROL-XL) 25 MG 24 hr tablet TAKE 1 TABLET BY MOUTH TWICE A DAY AT 12 NOON AND 4PM 180 tablet 1  . mometasone (NASONEX) 50 MCG/ACT nasal spray Place 2 sprays into the nose daily. 17 g 12  . Olopatadine HCl 0.2 % SOLN Place 1 drop into both eyes daily as needed.  11  . polyvinyl alcohol-povidone (REFRESH) 1.4-0.6 % ophthalmic solution Place 1 drop into both eyes 2 (two) times daily.    . Probiotic Product (PROBIOTIC DAILY PO) Take 1 capsule by mouth daily.    Marland Kitchen spironolactone (ALDACTONE) 25 MG tablet TAEK 1 & 1/2 TAB BY MOUTH EVERY DAY 180 tablet 0  . UNABLE TO FIND Vitamin E Vaginal Suppository     No current facility-administered medications on file prior to visit.     BP 111/67   Pulse 72   Temp 98.1 F (36.7 C) (Oral)   Resp 16   Ht 5\' 5"  (1.651 m)   Wt 180 lb 12.8 oz (82 kg)   SpO2 98%   BMI 30.09 kg/m       Objective:   Physical Exam   General  Mental Status - Alert. General Appearance - Well groomed. Not in acute distress.  Skin Rashes- No Rashes.  HEENT Head- Normal. Ear Auditory Canal - Left- Normal. Right - Normal.Tympanic Membrane- Left- Normal. Right- Normal. Eye Sclera/Conjunctiva- Left- Normal. Right- Normal. Nose & Sinuses Nasal Mucosa- Left-  Boggy and Congested. Right-  Boggy and  Congested.Bilateral maxillary and frontal sinus pressure. Mouth & Throat Lips: Upper Lip- Normal: no dryness, cracking, pallor,  cyanosis, or vesicular eruption. Lower Lip-Normal: no dryness, cracking, pallor, cyanosis or vesicular eruption. Buccal Mucosa- Bilateral- No Aphthous ulcers. Oropharynx- No Discharge or Erythema. +pnd. Tonsils: Characteristics- Bilateral- No Erythema or Congestion. Size/Enlargement- Bilateral- No enlargement. Discharge- bilateral-None.  Neck Neck- Supple. No Masses.   Chest and Lung Exam Auscultation: Breath Sounds:-Clear even  and unlabored.  Cardiovascular Auscultation:Rythm- Regular, rate and rhythm. Murmurs & Other Heart Sounds:Ausculatation of the heart reveal- No Murmurs.  Lymphatic Head & Neck General Head & Neck Lymphatics: Bilateral: Description- No Localized lymphadenopathy.  Abdomen-faint minimal suprapubic pressure on palpation. Otherwise soft, nontender, nondistended, positive bowel sounds and no rebound/guarding.  Back-no CVA tenderness.     Assessment & Plan:  You  appear to have a sinus infection. I am prescribing azithromycin antibiotic for the infection. To help with the nasal congestion use your nasonex nasal steroid. For your associated cough, I prescribed cough medicine benzonatate  Rest, hydrate, tylenol for fever.  For frequent urination will get ua and urine culture. Will put in future a1c order to check your sugar average past 3 months but after 01-16-2017. Today sugar was 101 when you checked. So high sugars may not be cause.  Follow up in 7 days or as needed.

## 2016-12-30 NOTE — Patient Instructions (Addendum)
You  appear to have a sinus infection. I am prescribing azithromycin antibiotic for the infection. To help with the nasal congestion use your nasonex nasal steroid. For your associated cough, I prescribed cough medicine benzonatate  Rest, hydrate, tylenol for fever.  For frequent urination will get ua and urine culture. Will put in future a1c order to check your sugar average past 3 months but after 01-16-2017. Today sugar was 101 when you checked. So high sugars may not be cause.  Follow up in 7 days or as needed.

## 2017-01-01 ENCOUNTER — Telehealth: Payer: Self-pay

## 2017-01-01 LAB — URINE CULTURE
MICRO NUMBER:: 80999927
SPECIMEN QUALITY:: ADEQUATE

## 2017-01-05 DIAGNOSIS — R69 Illness, unspecified: Secondary | ICD-10-CM | POA: Diagnosis not present

## 2017-01-06 ENCOUNTER — Ambulatory Visit (INDEPENDENT_AMBULATORY_CARE_PROVIDER_SITE_OTHER): Payer: Medicare HMO | Admitting: Obstetrics & Gynecology

## 2017-01-06 ENCOUNTER — Encounter: Payer: Self-pay | Admitting: Obstetrics & Gynecology

## 2017-01-06 VITALS — BP 138/52 | HR 72 | Wt 181.0 lb

## 2017-01-06 DIAGNOSIS — N905 Atrophy of vulva: Secondary | ICD-10-CM | POA: Diagnosis not present

## 2017-01-06 NOTE — Progress Notes (Addendum)
   Subjective:    Patient ID: Victoria Walker, female    DOB: 1950/10/25, 66 y.o.   MRN: [id number]  HPI 66 yo MAA lady, breast cancer survivor, here for follow up of her vulvar pain. She reports that her vulvar pain is relieved with vagifem 1 tablet per week.   Review of Systems     Objective:   Physical Exam Well nourished, well hydrated black female, no apparent distress Breathing, conversing, and ambulating normally Her vulva still shows atrophy, but it is improved         Assessment & Plan:  Symptomatic vulvovaginal atrophy- doing well with vagifem 1 tablet per week RTC prn She declines a flu vaccine today

## 2017-01-13 DIAGNOSIS — E1142 Type 2 diabetes mellitus with diabetic polyneuropathy: Secondary | ICD-10-CM | POA: Diagnosis not present

## 2017-01-13 DIAGNOSIS — E7800 Pure hypercholesterolemia, unspecified: Secondary | ICD-10-CM | POA: Diagnosis not present

## 2017-01-13 DIAGNOSIS — I480 Paroxysmal atrial fibrillation: Secondary | ICD-10-CM | POA: Diagnosis not present

## 2017-01-13 DIAGNOSIS — Z7984 Long term (current) use of oral hypoglycemic drugs: Secondary | ICD-10-CM | POA: Diagnosis not present

## 2017-01-13 DIAGNOSIS — I10 Essential (primary) hypertension: Secondary | ICD-10-CM | POA: Diagnosis not present

## 2017-01-13 DIAGNOSIS — E039 Hypothyroidism, unspecified: Secondary | ICD-10-CM | POA: Diagnosis not present

## 2017-01-13 DIAGNOSIS — Z6829 Body mass index (BMI) 29.0-29.9, adult: Secondary | ICD-10-CM | POA: Diagnosis not present

## 2017-01-13 DIAGNOSIS — R601 Generalized edema: Secondary | ICD-10-CM | POA: Diagnosis not present

## 2017-01-13 DIAGNOSIS — K219 Gastro-esophageal reflux disease without esophagitis: Secondary | ICD-10-CM | POA: Diagnosis not present

## 2017-01-13 DIAGNOSIS — Z0000 Encounter for general adult medical examination without abnormal findings: Secondary | ICD-10-CM | POA: Diagnosis not present

## 2017-01-21 ENCOUNTER — Other Ambulatory Visit: Payer: Self-pay | Admitting: Family Medicine

## 2017-01-26 ENCOUNTER — Telehealth: Payer: Self-pay | Admitting: Family Medicine

## 2017-01-26 NOTE — Telephone Encounter (Signed)
Relation to VN:[facility] Call back [place]   Reason for call:  Patient would like her shingles vaccination,please advise

## 2017-01-26 NOTE — Telephone Encounter (Signed)
If patient calls back she may need to go to her local pharmacy to have the shot.

## 2017-01-28 NOTE — Telephone Encounter (Signed)
Attempted to reach pt to notify her of below and spouse states she is not at home. Sent [employer].

## 2017-02-02 ENCOUNTER — Other Ambulatory Visit: Payer: Medicare HMO

## 2017-02-04 ENCOUNTER — Other Ambulatory Visit (INDEPENDENT_AMBULATORY_CARE_PROVIDER_SITE_OTHER): Payer: Medicare HMO

## 2017-02-04 ENCOUNTER — Ambulatory Visit (INDEPENDENT_AMBULATORY_CARE_PROVIDER_SITE_OTHER): Payer: Medicare HMO

## 2017-02-04 DIAGNOSIS — E119 Type 2 diabetes mellitus without complications: Secondary | ICD-10-CM

## 2017-02-04 DIAGNOSIS — Z23 Encounter for immunization: Secondary | ICD-10-CM | POA: Diagnosis not present

## 2017-02-04 LAB — COMPREHENSIVE METABOLIC PANEL
ALT: 20 U/L (ref 0–35)
AST: 18 U/L (ref 0–37)
Albumin: 4.4 g/dL (ref 3.5–5.2)
Alkaline Phosphatase: 55 U/L (ref 39–117)
BUN: 13 mg/dL (ref 6–23)
CO2: 29 mEq/L (ref 19–32)
Calcium: 9.6 mg/dL (ref 8.4–10.5)
Chloride: 103 mEq/L (ref 96–112)
Creatinine, Ser: 0.88 mg/dL (ref 0.40–1.20)
GFR: 82.38 mL/min (ref >60.00)
Glucose, Bld: 94 mg/dL (ref 70–99)
Potassium: 4.6 mEq/L (ref 3.5–5.1)
Sodium: 140 mEq/L (ref 135–145)
Total Bilirubin: 0.5 mg/dL (ref 0.2–1.2)
Total Protein: 6.9 g/dL (ref 6.0–8.3)

## 2017-02-04 LAB — HEMOGLOBIN A1C: Hgb A1c MFr Bld: 6.7 % — ABNORMAL HIGH (ref 4.6–6.5)

## 2017-02-17 NOTE — Telephone Encounter (Signed)
Completed.

## 2017-02-19 DIAGNOSIS — E1140 Type 2 diabetes mellitus with diabetic neuropathy, unspecified: Secondary | ICD-10-CM | POA: Diagnosis not present

## 2017-03-03 ENCOUNTER — Ambulatory Visit: Payer: Medicare HMO | Admitting: Family Medicine

## 2017-03-06 NOTE — Progress Notes (Signed)
Subjective:   Victoria Walker is a 66 y.o. female who presents for A Medicare Annual Wellness Visit.  The Patient was informed that the wellness visit is to identify future health risk and educate and initiate measures that can reduce risk for increased disease through the lifespan.   Describes health as fair, good or great? Good.   Review of Systems    No ROS.  Medicare Wellness Visit. Additional risk factors are reflected in the social history.   Sleep patterns: Sleeps about 8 hrs. Wakes once to urinate.   Female:   Pap-  Cervicovaginal screen normal on 12/10/16     Mammo-   Last 10/10/16: normal    Dexa scan-  ordered      [facility]- last 07/21/14: 2 polyps. Recall 7 yrs  Dental: Dr.Kwon every 6 months     Objective:    Today's Vitals   03/10/17 0942  BP: 122/72  Pulse: 71  SpO2: 99%  Weight: 179 lb 6.4 oz (81.4 kg)  Height: 5\' 5"  (1.651 m)   Body mass index is 29.85 kg/m.   Current Medications (verified) Outpatient Encounter Medications as of 03/10/2017  Medication Sig  . aspirin 81 MG EC tablet Take 81 mg by mouth every other day. Swallow whole.  Marland Kitchen atorvastatin (LIPITOR) 40 MG tablet Take 1/2 tablet by mouth on Monday, Wednesday, Thursday, Friday and Sunday, then take 1 tablet by mouth on Tuesday and Saturday.  . AVAPRO 150 MG tablet TAKE 1/2-1 TAB (75-150 MG TOTAL ) BY MOUTH DAILY  . calcium-vitamin D (OSCAL WITH D) 250-125 MG-UNIT per tablet Take 1 tablet by mouth daily.   . Cholecalciferol (VITAMIN D) 2000 UNITS CAPS Take 1 capsule by mouth every evening.   . Ciclopirox 1 % shampoo Apply 1 each topically every Friday.   . cycloSPORINE (RESTASIS) 0.05 % ophthalmic emulsion Place 1 drop into both eyes 2 (two) times daily.  Marland Kitchen diltiazem (TIAZAC) 360 MG 24 hr capsule TAKE 1 CAPSULE (360 MG TOTAL) BY MOUTH EVERY MORNING.  Marland Kitchen esomeprazole (NEXIUM) 40 MG capsule TAKE ONE CAPSULE BY MOUTH IN THE MORNING  . Estradiol 10 MCG TABS vaginal tablet 1 tablet per vagina at bedtime  QOD  . gabapentin (NEURONTIN) 300 MG capsule Take 1 capsule (300 mg total) by mouth at bedtime.  Marland Kitchen glucose blood (ONE TOUCH TEST STRIPS) test strip Use as directed twice daily to check blood sugar. DX code E11.9  . levocetirizine (XYZAL) 5 MG tablet TAKE 1 TABLET (5 MG TOTAL) BY MOUTH EVERY EVENING FOR RUNNY NOSE OR ITCHING  . levothyroxine (SYNTHROID, LEVOTHROID) 75 MCG tablet TAKE 1 TABLET BY MOUTH MONDAY THROUHG FRIDAY & 1/2 TAB ON SATURDAY & SUNDAY  . loteprednol (LOTEMAX) 0.5 % ophthalmic suspension Place 1 drop into the left eye daily as needed ("arthiritis of the eye").   . metFORMIN (GLUCOPHAGE-XR) 750 MG 24 hr tablet TAKE 1 TABLET IN AM AND 1 TABLET AT LUNCH  . metoprolol succinate (TOPROL-XL) 25 MG 24 hr tablet TAKE 1 TABLET BY MOUTH TWICE A DAY AT 12 NOON AND 4PM  . mometasone (NASONEX) 50 MCG/ACT nasal spray Place 2 sprays into the nose daily.  . Olopatadine HCl 0.2 % SOLN Place 1 drop into both eyes daily as needed.  . polyvinyl alcohol-povidone (REFRESH) 1.4-0.6 % ophthalmic solution Place 1 drop into both eyes 2 (two) times daily.  . Probiotic Product (PROBIOTIC DAILY PO) Take 1 capsule by mouth daily.  Marland Kitchen spironolactone (ALDACTONE) 25 MG tablet TAEK 1 &  1/2 TAB BY MOUTH EVERY DAY  . UNABLE TO FIND Vitamin E Vaginal Suppository  . DOCOSAHEXAENOIC ACID PO Take by mouth.  . [DISCONTINUED] azithromycin (ZITHROMAX) 250 MG tablet Take 2 tablets by mouth on day 1, followed by 1 tablet by mouth daily for 4 days.  . [DISCONTINUED] benzonatate (TESSALON) 100 MG capsule Take 1 capsule (100 mg total) by mouth 3 (three) times daily as needed for cough.   No facility-administered encounter medications on file as of 03/10/2017.     Allergies (verified) Codeine; Penicillins; Sulfa antibiotics; and Vicodin [hydrocodone-acetaminophen]   History: Past Medical History:  Diagnosis Date  . Arthritis    "both knees" (07/02/2015)  . Cancer of right breast ([place])   . Claustrophobia   . Dysrhythmia     HX OF FAST HEART RATE AND PALPITATIONS - METOPROLOL HAS HELPED  . GERD (gastroesophageal reflux disease)   . H/O hiatal hernia   . H/O iritis    LEFT EYE - STATES HER EYE BECOMES RED AND VERY SENSITIVE TO LIGHT WHEN FLARE UP OF IRITIS  . Heart murmur    "benign; 06/2015"  . High cholesterol   . History of blood transfusion    "when I had my partial hysterectomy"  . History of chickenpox 09/26/2014  . History of measles   . History of MRSA infection   . History of shingles   . Hypertension   . Hypothyroidism   . Medicare annual wellness visit, initial 04/07/2015  . Multinodular thyroid    PT HAVING TROUBLE SWALLOWING  . Neuropathy    FEET  . Primary localized osteoarthritis of right knee   . Spinal headache    spinal headache with epidural  . Type II diabetes mellitus (HCC)    ORAL MEDICATION - NO INSULIN  . Wears glasses    Past Surgical History:  Procedure Laterality Date  . ABDOMINAL HYSTERECTOMY  1988  . ABDOMINOPLASTY  1986  . ANTERIOR (CYSTOCELE) AND POSTERIOR REPAIR (RECTOCELE)  05/11/2012   Performed by Reece Packer, MD at [facility] ORS  . BREAST BIOPSY Right   . CARDIAC CATHETERIZATION  1999  . COLONOSCOPY    . CYSTOSCOPY WITH URETHRAL DILATATION  05/11/2012   Performed by Reece Packer, MD at [facility] ORS  . ESOPHAGOGASTRODUODENOSCOPY    . HERNIA REPAIR    . MASTECTOMY Right 2007  . PLANTAR FASCIA SURGERY Left   . TOTAL KNEE ARTHROPLASTY Right 07/02/2015  . TOTAL KNEE ARTHROPLASTY Right 07/02/2015   Performed by Elsie Saas, MD at [place]  . TOTAL THYROIDECTOMY  1976   removed three tumor (2 on right; 1 on left)  . TOTAL THYROIDECTOMY N/A 02/23/2014   Performed by Armandina Gemma, MD at [facility] ORS  . TUBAL LIGATION    . UMBILICAL HERNIA REPAIR  1986  . VAGINAL VAULT SUSPENSION  05/11/2012   Performed by Reece Packer, MD at [facility] ORS  . VULVA / PERINEUM BIOPSY     vulvar   Family History  Problem Relation Age of Onset  . Hypertension Mother   . Heart  disease Mother        pacer, CHF  . Hyperlipidemia Mother   . Kidney disease Mother        1 kidney due to stone  . Kidney Stones Mother   . Hypertension Father   . Cancer Father        lung, psa  . Diabetes Father   . Alcohol abuse Father   . Diabetes  Sister   . Cancer Sister   . Hypertension Sister   . Diabetes Brother   . Hypertension Brother   . Hyperlipidemia Brother   . Kidney disease Brother        dialysis  . Stroke Brother   . Hypertension Maternal Grandmother   . Heart disease Maternal Grandmother   . Stroke Maternal Grandmother   . Hypertension Maternal Grandfather   . Hypertension Daughter   . Arthritis Son   . Hyperlipidemia Sister   . Hypertension Sister   . Diabetes Sister   . Thyroid disease Sister   . Kidney disease Sister   . Cancer Sister        thyroid and breast  . Arthritis Sister        back pain  . Hypertension Sister   . Thyroid disease Sister   . Hypertension Brother   . Hyperlipidemia Brother   . Hypertension Brother   . Hyperlipidemia Brother   . Benign prostatic hyperplasia Brother   . Hypertension Daughter   . Hyperlipidemia Daughter   . Diabetes Daughter   . Hypertension Maternal Aunt   . Hypertension Maternal Uncle   . Hypertension Paternal Aunt   . Cancer Paternal Aunt   . Hypertension Paternal Uncle    Social History   Occupational History    Comment: Retired  Tobacco Use  . Smoking status: Never Smoker  . Smokeless tobacco: Never Used  Substance and Sexual Activity  . Alcohol use: No    Alcohol/week: 0.0 oz  . Drug use: No  . Sexual activity: Yes    Birth control/protection: Surgical    Comment: lives with husband, retired from various jobs, diabetic diet    Tobacco Counseling Counseling given: Not Answered   Activities of Daily Living In your present state of health, do you have any difficulty performing the following activities: 03/10/2017  Hearing? N  Vision? N  Comment Wearing glasses. Eye doctor every 6  months.  Difficulty concentrating or making decisions? N  Walking or climbing stairs? N  Dressing or bathing? N  Doing errands, shopping? N  Preparing Food and eating ? N  Using the Toilet? N  In the past six months, have you accidently leaked urine? N  Do you have problems with loss of bowel control? N  Managing your Medications? N  Managing your Finances? N  Housekeeping or managing your Housekeeping? N  Some recent data might be hidden    Immunizations and Health Maintenance Immunization History  Administered Date(s) Administered  . Influenza, High Dose Seasonal PF 01/21/2016, 02/04/2017  . Influenza,inj,Quad PF,6+ Mos 01/04/2013, 01/23/2014, 12/28/2014  . Pneumococcal Conjugate-13 12/28/2014  . Pneumococcal Polysaccharide-23 05/12/2012  . Tdap 05/02/2014   Health Maintenance Due  Topic Date Due  . FOOT EXAM  02/24/2017    Patient Care Team: Mosie Lukes, MD as PCP - General (Family Medicine) Juanita Craver, MD as Consulting Physician (Gastroenterology) [place], Lois Huxley, MD (Ophthalmology) Camelia Phenes, DPM as Consulting Physician (Podiatry) Iona Beard, OD as Referring Physician (Optometry) Charlies Silvers, MD as Consulting Physician (Allergy and Immunology) Lavonia Drafts, MD as Consulting Physician (Obstetrics and Gynecology) Elsie Saas, MD as Consulting Physician (Orthopedic Surgery)  Indicate any recent Medical Services you may have received from other than [facility] providers in the past year (date may be approximate).     Assessment:   This is a routine wellness examination for [facility]. Physical assessment deferred to PCP.  Dietary issues and exercise activities discussed: Current Exercise  Habits: Structured exercise class, Type of exercise: strength training/weights(bicycle), Time (Minutes): 60, Frequency (Times/Week): 3, Weekly Exercise (Minutes/Week): 180, Exercise limited by: None identified Diet (meal preparation, eat out, water  intake, caffeinated beverages, dairy products, fruits and vegetables): well balanced   Goals    . Increase physical activity    . Lose 10 lbs by next year (pt-stated)      Depression Screen PHQ 2/9 Scores 03/10/2017 02/25/2016 02/22/2015 01/23/2014  PHQ - 2 Score 0 0 0 0    Fall Risk Fall Risk  03/10/2017 02/25/2016 02/22/2015 01/23/2014 11/14/2013  Falls in the past year? No No No No No    Cognitive Function: MMSE - Mini Mental State Exam 03/10/2017 02/25/2016 02/22/2015  Orientation to time 5 5 5   Orientation to Place 5 5 5   Registration 3 3 3   Attention/ Calculation 5 5 5   Recall 3 3 1   Language- name 2 objects 2 2 2   Language- repeat 1 1 1   Language- follow 3 step command 3 3 3   Language- read & follow direction 1 1 1   Write a sentence 1 1 1   Copy design 1 0 1  Total score 30 29 28         Screening Tests Health Maintenance  Topic Date Due  . FOOT EXAM  02/24/2017  . PNA vac Low Risk Adult (2 of 2 - PPSV23) 05/12/2017  . OPHTHALMOLOGY EXAM  06/19/2017  . HEMOGLOBIN A1C  08/05/2017  . MAMMOGRAM  10/10/2018  . COLONOSCOPY  07/20/2021  . TETANUS/TDAP  05/02/2024  . INFLUENZA VACCINE  Completed  . DEXA SCAN  Completed  . Hepatitis C Screening  Completed      Plan:   Follow up with Dr.Blyth today as scheduled  Continue to eat heart healthy diet (full of fruits, vegetables, whole grains, lean protein, water--limit salt, fat, and sugar intake) and increase physical activity as tolerated.  Continue doing brain stimulating activities (puzzles, reading, adult coloring books, staying active) to keep memory sharp.   Bring a copy of your living will and/or healthcare power of attorney to your next office visit.    I have personally reviewed and noted the following in the patient's chart:   . Medical and social history . Use of alcohol, tobacco or illicit drugs  . Current medications and supplements . Functional ability and status . Nutritional status . Physical  activity . Advanced directives . List of other physicians . Hospitalizations, surgeries, and ER visits in previous 12 months . Vitals . Screenings to include cognitive, depression, and falls . Referrals and appointments  In addition, I have reviewed and discussed with patient certain preventive protocols, quality metrics, and best practice recommendations. A written personalized care plan for preventive services as well as general preventive health recommendations were provided to patient.     Shela Nevin, [place]   03/10/2017

## 2017-03-10 ENCOUNTER — Ambulatory Visit: Payer: Medicare HMO | Admitting: *Deleted

## 2017-03-10 ENCOUNTER — Encounter: Payer: Self-pay | Admitting: Family Medicine

## 2017-03-10 ENCOUNTER — Other Ambulatory Visit (HOSPITAL_COMMUNITY)
Admission: RE | Admit: 2017-03-10 | Discharge: 2017-03-10 | Disposition: A | Discharge status: Home / Self Care | Payer: Medicare HMO | Source: Ambulatory Visit | Attending: Family Medicine | Admitting: Family Medicine

## 2017-03-10 ENCOUNTER — Ambulatory Visit (INDEPENDENT_AMBULATORY_CARE_PROVIDER_SITE_OTHER): Payer: Medicare HMO | Admitting: Family Medicine

## 2017-03-10 VITALS — BP 122/72 | HR 71 | Ht 65.0 in | Wt 179.4 lb

## 2017-03-10 DIAGNOSIS — N760 Acute vaginitis: Secondary | ICD-10-CM | POA: Diagnosis not present

## 2017-03-10 DIAGNOSIS — E782 Mixed hyperlipidemia: Secondary | ICD-10-CM

## 2017-03-10 DIAGNOSIS — E039 Hypothyroidism, unspecified: Secondary | ICD-10-CM

## 2017-03-10 DIAGNOSIS — Z780 Asymptomatic menopausal state: Secondary | ICD-10-CM | POA: Diagnosis not present

## 2017-03-10 DIAGNOSIS — R350 Frequency of micturition: Secondary | ICD-10-CM | POA: Insufficient documentation

## 2017-03-10 DIAGNOSIS — M5431 Sciatica, right side: Secondary | ICD-10-CM

## 2017-03-10 DIAGNOSIS — N951 Menopausal and female climacteric states: Secondary | ICD-10-CM | POA: Diagnosis not present

## 2017-03-10 DIAGNOSIS — M8580 Other specified disorders of bone density and structure, unspecified site: Secondary | ICD-10-CM

## 2017-03-10 DIAGNOSIS — Z0000 Encounter for general adult medical examination without abnormal findings: Secondary | ICD-10-CM

## 2017-03-10 DIAGNOSIS — E118 Type 2 diabetes mellitus with unspecified complications: Secondary | ICD-10-CM | POA: Diagnosis not present

## 2017-03-10 LAB — LIPID PANEL
Cholesterol: 183 mg/dL (ref 0–200)
HDL: 41.6 mg/dL (ref >39.00)
LDL Cholesterol: 104 mg/dL — ABNORMAL HIGH (ref 0–99)
NonHDL: 141.26
Total CHOL/HDL Ratio: 4
Triglycerides: 188 mg/dL — ABNORMAL HIGH (ref 0.0–149.0)
VLDL: 37.6 mg/dL (ref 0.0–40.0)

## 2017-03-10 LAB — URINALYSIS
Bilirubin Urine: NEGATIVE
Hgb urine dipstick: NEGATIVE
Ketones, ur: NEGATIVE
Leukocytes, UA: NEGATIVE
Nitrite: NEGATIVE
Specific Gravity, Urine: <=1.005 — AB (ref 1.000–1.030)
Total Protein, Urine: NEGATIVE
Urine Glucose: NEGATIVE
Urobilinogen, UA: 0.2 (ref 0.0–1.0)
pH: 6.5 (ref 5.0–8.0)

## 2017-03-10 LAB — COMPREHENSIVE METABOLIC PANEL
ALT: 22 U/L (ref 0–35)
AST: 20 U/L (ref 0–37)
Albumin: 4.5 g/dL (ref 3.5–5.2)
Alkaline Phosphatase: 56 U/L (ref 39–117)
BUN: 15 mg/dL (ref 6–23)
CO2: 30 mEq/L (ref 19–32)
Calcium: 9.9 mg/dL (ref 8.4–10.5)
Chloride: 104 mEq/L (ref 96–112)
Creatinine, Ser: 0.87 mg/dL (ref 0.40–1.20)
GFR: 83.45 mL/min (ref >60.00)
Glucose, Bld: 70 mg/dL (ref 70–99)
Potassium: 4.6 mEq/L (ref 3.5–5.1)
Sodium: 141 mEq/L (ref 135–145)
Total Bilirubin: 0.4 mg/dL (ref 0.2–1.2)
Total Protein: 7 g/dL (ref 6.0–8.3)

## 2017-03-10 LAB — CBC
HCT: 39.2 % (ref 36.0–46.0)
Hemoglobin: 12.8 g/dL (ref 12.0–15.0)
MCHC: 32.6 g/dL (ref 30.0–36.0)
MCV: 95 fl (ref 78.0–100.0)
Platelets: 267 10*3/uL (ref 150.0–400.0)
RBC: 4.13 Mil/uL (ref 3.87–5.11)
RDW: 13.6 % (ref 11.5–15.5)
WBC: 7 10*3/uL (ref 4.0–10.5)

## 2017-03-10 LAB — TSH: TSH: 2.86 u[IU]/mL (ref 0.35–4.50)

## 2017-03-10 NOTE — Assessment & Plan Note (Signed)
Xray right hip, lidocaine patches and referred to sports md

## 2017-03-10 NOTE — Assessment & Plan Note (Signed)
On Levothyroxine, continue to monitor 

## 2017-03-10 NOTE — Progress Notes (Signed)
Patient ID: Victoria Walker, female   DOB: June 09, 1950, 66 y.o.   MRN: [id number]   Subjective:    Patient ID: Victoria Walker, female    DOB: 08-03-1950, 66 y.o.   MRN: [id number]  Chief Complaint  Patient presents with  . Medicare Wellness    with RN    HPI Patient is in today for annual exam and follow-up on chronic medical conditions including diabetes mellitus, hypo-thyroidism, hyperlipidemia and hypertension.  She feels well today.  She has been seen by GYN and placed on estradiol suppositories for atrophic vaginitis.  She notes a good response to just one suppository weekly.  She recently had some urinary frequency and some vaginal itching for which she took a couple of doses of ciprofloxacin and a Diflucan she reports with good results.  Minimal itching is noted along with some mild frequency at this time.  She is noting some pain radiating down her right leg at times from her right hip to just above her knee laterally.  No falls or trauma.  She has not tried any over-the-counter medications.  No recent febrile illness or acute hospitalization. Denies CP/palp/SOB/HA/congestion/fevers/GI or GU c/o. Taking meds as prescribed  Past Medical History:  Diagnosis Date  . Arthritis    "both knees" (07/02/2015)  . Cancer of right breast ([place])   . Claustrophobia   . Dysrhythmia    HX OF FAST HEART RATE AND PALPITATIONS - METOPROLOL HAS HELPED  . GERD (gastroesophageal reflux disease)   . H/O hiatal hernia   . H/O iritis    LEFT EYE - STATES HER EYE BECOMES RED AND VERY SENSITIVE TO LIGHT WHEN FLARE UP OF IRITIS  . Heart murmur    "benign; 06/2015"  . High cholesterol   . History of blood transfusion    "when I had my partial hysterectomy"  . History of chickenpox 09/26/2014  . History of measles   . History of MRSA infection   . History of shingles   . Hypertension   . Hypothyroidism   . Medicare annual wellness visit, initial 04/07/2015  . Multinodular thyroid    PT HAVING TROUBLE  SWALLOWING  . Neuropathy    FEET  . Primary localized osteoarthritis of right knee   . Sciatica of right side 04/25/2015  . Spinal headache    spinal headache with epidural  . Type II diabetes mellitus ([facility])    ORAL MEDICATION - NO INSULIN  . Wears glasses     Past Surgical History:  Procedure Laterality Date  . ABDOMINAL HYSTERECTOMY  1988  . ABDOMINOPLASTY  1986  . ANTERIOR AND POSTERIOR REPAIR  05/11/2012   Procedure: ANTERIOR (CYSTOCELE) AND POSTERIOR REPAIR (RECTOCELE);  Surgeon: Reece Packer, MD;  Location: [facility];  Service: Urology;;  with graft  . BREAST BIOPSY Right   . CARDIAC CATHETERIZATION  1999  . COLONOSCOPY    . CYSTOSCOPY WITH URETHRAL DILATATION  05/11/2012   Procedure: CYSTOSCOPY WITH URETHRAL DILATATION;  Surgeon: Reece Packer, MD;  Location: [facility];  Service: Urology;;  . ESOPHAGOGASTRODUODENOSCOPY    . HERNIA REPAIR    . MASTECTOMY Right 2007  . PLANTAR FASCIA SURGERY Left   . THYROIDECTOMY N/A 02/23/2014   Procedure: TOTAL THYROIDECTOMY;  Surgeon: Armandina Gemma, MD;  Location: [facility];  Service: General;  Laterality: N/A;  . TOTAL KNEE ARTHROPLASTY Right 07/02/2015  . TOTAL KNEE ARTHROPLASTY Right 07/02/2015   Procedure: TOTAL KNEE ARTHROPLASTY;  Surgeon: Elsie Saas, MD;  Location: [facility];  Service:  Orthopedics;  Laterality: Right;  . TOTAL THYROIDECTOMY  1976   removed three tumor (2 on right; 1 on left)  . TUBAL LIGATION    . UMBILICAL HERNIA REPAIR  1986  . VAGINAL PROLAPSE REPAIR  05/11/2012   Procedure: VAGINAL VAULT SUSPENSION;  Surgeon: Reece Packer, MD;  Location: [facility];  Service: Urology;;  . VULVA / PERINEUM BIOPSY     vulvar    Family History  Problem Relation Age of Onset  . Hypertension Mother   . Heart disease Mother        pacer, CHF  . Hyperlipidemia Mother   . Kidney disease Mother        1 kidney due to stone  . Kidney Stones Mother   . Hypertension Father   . Cancer Father        lung, psa  . Diabetes Father     . Alcohol abuse Father   . Diabetes Sister   . Cancer Sister   . Hypertension Sister   . Diabetes Brother   . Hypertension Brother   . Hyperlipidemia Brother   . Kidney disease Brother        dialysis  . Stroke Brother   . Hypertension Maternal Grandmother   . Heart disease Maternal Grandmother   . Stroke Maternal Grandmother   . Hypertension Maternal Grandfather   . Hypertension Daughter   . Arthritis Son   . Hyperlipidemia Sister   . Hypertension Sister   . Diabetes Sister   . Thyroid disease Sister   . Kidney disease Sister   . Cancer Sister        thyroid and breast  . Arthritis Sister        back pain  . Hypertension Sister   . Thyroid disease Sister   . Hypertension Brother   . Hyperlipidemia Brother   . Hypertension Brother   . Hyperlipidemia Brother   . Benign prostatic hyperplasia Brother   . Hypertension Daughter   . Hyperlipidemia Daughter   . Diabetes Daughter   . Hypertension Maternal Aunt   . Hypertension Maternal Uncle   . Hypertension Paternal Aunt   . Cancer Paternal Aunt   . Hypertension Paternal Uncle     Social History   Socioeconomic History  . Marital status: Married    Spouse name: Not on file  . Number of children: 3  . Years of education: Not on file  . Highest education level: Not on file  Social Needs  . Financial resource strain: Not on file  . Food insecurity - worry: Not on file  . Food insecurity - inability: Not on file  . Transportation needs - medical: Not on file  . Transportation needs - non-medical: Not on file  Occupational History    Comment: Retired  Tobacco Use  . Smoking status: Never Smoker  . Smokeless tobacco: Never Used  Substance and Sexual Activity  . Alcohol use: No    Alcohol/week: 0.0 oz  . Drug use: No  . Sexual activity: Yes    Birth control/protection: Surgical    Comment: lives with husband, retired from various jobs, diabetic diet  Other Topics Concern  . Not on file  Social History  Narrative  . Not on file    Outpatient Medications Prior to Visit  Medication Sig Dispense Refill  . aspirin 81 MG EC tablet Take 81 mg by mouth every other day. Swallow whole.    Marland Kitchen atorvastatin (LIPITOR) 40 MG tablet Take 1/2 tablet  by mouth on Monday, Wednesday, Thursday, Friday and Sunday, then take 1 tablet by mouth on Tuesday and Saturday. 58 tablet 3  . AVAPRO 150 MG tablet TAKE 1/2-1 TAB (75-150 MG TOTAL ) BY MOUTH DAILY 90 tablet 2  . calcium-vitamin D (OSCAL WITH D) 250-125 MG-UNIT per tablet Take 1 tablet by mouth daily.     . Cholecalciferol (VITAMIN D) 2000 UNITS CAPS Take 1 capsule by mouth every evening.     . Ciclopirox 1 % shampoo Apply 1 each topically every Friday.     . cycloSPORINE (RESTASIS) 0.05 % ophthalmic emulsion Place 1 drop into both eyes 2 (two) times daily.    Marland Kitchen diltiazem (TIAZAC) 360 MG 24 hr capsule TAKE 1 CAPSULE (360 MG TOTAL) BY MOUTH EVERY MORNING. 90 capsule 3  . esomeprazole (NEXIUM) 40 MG capsule TAKE ONE CAPSULE BY MOUTH IN THE MORNING 90 capsule 2  . Estradiol 10 MCG TABS vaginal tablet 1 tablet per vagina at bedtime QOD 30 tablet 12  . gabapentin (NEURONTIN) 300 MG capsule Take 1 capsule (300 mg total) by mouth at bedtime. 270 capsule 1  . glucose blood (ONE TOUCH TEST STRIPS) test strip Use as directed twice daily to check blood sugar. DX code E11.9 300 each 1  . levocetirizine (XYZAL) 5 MG tablet TAKE 1 TABLET (5 MG TOTAL) BY MOUTH EVERY EVENING FOR RUNNY NOSE OR ITCHING 90 tablet 2  . levothyroxine (SYNTHROID, LEVOTHROID) 75 MCG tablet TAKE 1 TABLET BY MOUTH MONDAY THROUHG FRIDAY & 1/2 TAB ON SATURDAY & SUNDAY 90 tablet 0  . loteprednol (LOTEMAX) 0.5 % ophthalmic suspension Place 1 drop into the left eye daily as needed ("arthiritis of the eye").     . metFORMIN (GLUCOPHAGE-XR) 750 MG 24 hr tablet TAKE 1 TABLET IN AM AND 1 TABLET AT LUNCH 180 tablet 1  . metoprolol succinate (TOPROL-XL) 25 MG 24 hr tablet TAKE 1 TABLET BY MOUTH TWICE A DAY AT 12 NOON  AND 4PM 180 tablet 1  . mometasone (NASONEX) 50 MCG/ACT nasal spray Place 2 sprays into the nose daily. 17 g 12  . Olopatadine HCl 0.2 % SOLN Place 1 drop into both eyes daily as needed.  11  . polyvinyl alcohol-povidone (REFRESH) 1.4-0.6 % ophthalmic solution Place 1 drop into both eyes 2 (two) times daily.    . Probiotic Product (PROBIOTIC DAILY PO) Take 1 capsule by mouth daily.    Marland Kitchen spironolactone (ALDACTONE) 25 MG tablet TAEK 1 & 1/2 TAB BY MOUTH EVERY DAY 180 tablet 0  . UNABLE TO FIND Vitamin E Vaginal Suppository    . DOCOSAHEXAENOIC ACID PO Take by mouth.    Marland Kitchen azithromycin (ZITHROMAX) 250 MG tablet Take 2 tablets by mouth on day 1, followed by 1 tablet by mouth daily for 4 days. 6 tablet 0  . benzonatate (TESSALON) 100 MG capsule Take 1 capsule (100 mg total) by mouth 3 (three) times daily as needed for cough. 21 capsule 0   No facility-administered medications prior to visit.     Allergies  Allergen Reactions  . Codeine Anxiety    Jittery   . Penicillins Itching  . Sulfa Antibiotics Itching  . Vicodin [Hydrocodone-Acetaminophen] Itching    ITCHING     Review of Systems  Constitutional: Negative for fever and malaise/fatigue.  HENT: Negative for congestion.   Eyes: Negative for blurred vision.  Respiratory: Negative for shortness of breath.   Cardiovascular: Negative for chest pain, palpitations and leg swelling.  Gastrointestinal: Negative for abdominal  pain, blood in stool and nausea.  Genitourinary: Positive for frequency. Negative for dysuria.  Musculoskeletal: Positive for joint pain. Negative for falls.  Skin: Negative for rash.  Neurological: Negative for dizziness, loss of consciousness and headaches.  Endo/Heme/Allergies: Negative for environmental allergies.  Psychiatric/Behavioral: Negative for depression. The patient is not nervous/anxious.        Objective:    Physical Exam  Constitutional: She is oriented to person, place, and time. She appears  well-developed and well-nourished. No distress.  HENT:  Head: Normocephalic and atraumatic.  Nose: Nose normal.  Eyes: Right eye exhibits no discharge. Left eye exhibits no discharge.  Neck: Normal range of motion. Neck supple.  Cardiovascular: Normal rate and regular rhythm.  No murmur heard. Pulmonary/Chest: Effort normal and breath sounds normal.  Abdominal: Soft. Bowel sounds are normal. There is no tenderness.  Musculoskeletal: She exhibits no edema.  Neurological: She is alert and oriented to person, place, and time.  Skin: Skin is warm and dry.  Psychiatric: She has a normal mood and affect.  Nursing note and vitals reviewed.   BP 122/72 (BP Location: Left Arm, Patient Position: Sitting, Cuff Size: Normal)   Pulse 71   Ht 5\' 5"  (1.651 m)   Wt 179 lb 6.4 oz (81.4 kg)   SpO2 99%   BMI 29.85 kg/m  Wt Readings from Last 3 Encounters:  03/10/17 179 lb 6.4 oz (81.4 kg)  01/06/17 181 lb (82.1 kg)  12/30/16 180 lb 12.8 oz (82 kg)     Lab Results  Component Value Date   WBC 7.9 10/16/2016   HGB 12.7 10/16/2016   HCT 38.4 10/16/2016   PLT 277.0 10/16/2016   GLUCOSE 94 02/04/2017   CHOL 170 10/16/2016   TRIG 193.0 (H) 10/16/2016   HDL 41.40 10/16/2016   LDLDIRECT 107.0 07/01/2016   LDLCALC 90 10/16/2016   ALT 20 02/04/2017   AST 18 02/04/2017   NA 140 02/04/2017   K 4.6 02/04/2017   CL 103 02/04/2017   CREATININE 0.88 02/04/2017   BUN 13 02/04/2017   CO2 29 02/04/2017   TSH 2.53 10/16/2016   INR 1.01 06/21/2015   HGBA1C 6.7 (H) 02/04/2017   MICROALBUR <0.7 12/28/2014    Lab Results  Component Value Date   TSH 2.53 10/16/2016   Lab Results  Component Value Date   WBC 7.9 10/16/2016   HGB 12.7 10/16/2016   HCT 38.4 10/16/2016   MCV 93.0 10/16/2016   PLT 277.0 10/16/2016   Lab Results  Component Value Date   NA 140 02/04/2017   K 4.6 02/04/2017   CO2 29 02/04/2017   GLUCOSE 94 02/04/2017   BUN 13 02/04/2017   CREATININE 0.88 02/04/2017   BILITOT  0.5 02/04/2017   ALKPHOS 55 02/04/2017   AST 18 02/04/2017   ALT 20 02/04/2017   PROT 6.9 02/04/2017   ALBUMIN 4.4 02/04/2017   CALCIUM 9.6 02/04/2017   ANIONGAP 9 07/04/2015   GFR 82.38 02/04/2017   Lab Results  Component Value Date   CHOL 170 10/16/2016   Lab Results  Component Value Date   HDL 41.40 10/16/2016   Lab Results  Component Value Date   LDLCALC 90 10/16/2016   Lab Results  Component Value Date   TRIG 193.0 (H) 10/16/2016   Lab Results  Component Value Date   CHOLHDL 4 10/16/2016   Lab Results  Component Value Date   HGBA1C 6.7 (H) 02/04/2017       Assessment & Plan:  Problem List Items Addressed This Visit    Hyperlipidemia, mixed    Encouraged heart healthy diet, increase exercise, avoid trans fats, consider a krill oil cap daily      Relevant Orders   Lipid panel   Osteopenia    Encouraged to get adequate exercise, calcium and vitamin d intake      Hypothyroidism    On Levothyroxine, continue to monitor      Relevant Orders   TSH   Vaginal dryness, menopausal    Is using her Estradiol suppositories once weekly vaginally will use prn to minimize risk. Had some recent itching and took a diflucan which helped but not fully. Will check for yeast and uti      Vaginitis and vulvovaginitis    Recent itching improved some with Diflucan still some itching      Relevant Orders   CBC   Urine cytology ancillary only   Sciatica of right side    Xray right hip, lidocaine patches and referred to sports md      Relevant Orders   Ambulatory referral to Sports Medicine   DG HIP UNILAT WITH PELVIS 2-3 VIEWS RIGHT   Diabetes mellitus ([place])    hgba1c acceptable, minimize simple carbs. Increase exercise as tolerated. Continue current meds      Relevant Orders   Comprehensive metabolic panel   Urinary frequency    Check UA and c and s      Relevant Orders   Urinalysis   Urine Culture    Other Visit Diagnoses    Postmenopausal    -   Primary   Relevant Orders   DG Bone Density   Encounter for Medicare annual wellness exam          I have discontinued Lile Servais's azithromycin and benzonatate. I am also having her maintain her calcium-vitamin D, loteprednol, Ciclopirox, cycloSPORINE, polyvinyl alcohol-povidone, glucose blood, Vitamin D, Probiotic Product (PROBIOTIC DAILY PO), aspirin, gabapentin, DOCOSAHEXAENOIC ACID PO, atorvastatin, UNABLE TO FIND, diltiazem, esomeprazole, AVAPRO, levocetirizine, mometasone, Olopatadine HCl, metoprolol succinate, metFORMIN, levothyroxine, Estradiol, and spironolactone.  No orders of the defined types were placed in this encounter.    Penni Homans, MD

## 2017-03-10 NOTE — Assessment & Plan Note (Addendum)
Is using her Estradiol suppositories once weekly vaginally will use prn to minimize risk. Had some recent itching and took a diflucan which helped but not fully. Will check for yeast and uti

## 2017-03-10 NOTE — Patient Instructions (Addendum)
Continue to eat heart healthy diet (full of fruits, vegetables, whole grains, lean protein, water--limit salt, fat, and sugar intake) and increase physical activity as tolerated.  Continue doing brain stimulating activities (puzzles, reading, adult coloring books, staying active) to keep memory sharp.   Bring a copy of your living will and/or healthcare power of attorney to your next office visit.  Consider the new Shingles shot, Shingrix is 2 shots over 2-6 months can get at pharmacy Lidocaine patches to hip   Victoria Walker , Thank you for taking time to come for your Medicare Wellness Visit. I appreciate your ongoing commitment to your health goals. Please review the following plan we discussed and let me know if I can assist you in the future.   These are the goals we discussed: Goals    . Increase physical activity    . Lose 10 lbs by next year (pt-stated)       This is a list of the screening recommended for you and due dates:  Health Maintenance  Topic Date Due  . Complete foot exam   02/24/2017  . Pneumonia vaccines (2 of 2 - PPSV23) 05/12/2017  . Eye exam for diabetics  06/19/2017  . Hemoglobin A1C  08/05/2017  . Mammogram  10/10/2018  . Colon Cancer Screening  07/20/2021  . Tetanus Vaccine  05/02/2024  . Flu Shot  Completed  . DEXA scan (bone density measurement)  Completed  .  Hepatitis C: One time screening is recommended by Center for Disease Control  (CDC) for  adults born from [birth year] through 1966.   Completed    Health Maintenance for Postmenopausal Women Menopause is a normal process in which your reproductive ability comes to an end. This process happens gradually over a span of months to years, usually between the ages of 79 and 3. Menopause is complete when you have missed 12 consecutive menstrual periods. It is important to talk with your health care provider about some of the most common conditions that affect postmenopausal women, such as heart disease, cancer,  and bone loss (osteoporosis). Adopting a healthy lifestyle and getting preventive care can help to promote your health and wellness. Those actions can also lower your chances of developing some of these common conditions. What should I know about menopause? During menopause, you may experience a number of symptoms, such as:  Moderate-to-severe hot flashes.  Night sweats.  Decrease in sex drive.  Mood swings.  Headaches.  Tiredness.  Irritability.  Memory problems.  Insomnia.  Choosing to treat or not to treat menopausal changes is an individual decision that you make with your health care provider. What should I know about hormone replacement therapy and supplements? Hormone therapy products are effective for treating symptoms that are associated with menopause, such as hot flashes and night sweats. Hormone replacement carries certain risks, especially as you become older. If you are thinking about using estrogen or estrogen with progestin treatments, discuss the benefits and risks with your health care provider. What should I know about heart disease and stroke? Heart disease, heart attack, and stroke become more likely as you age. This may be due, in part, to the hormonal changes that your body experiences during menopause. These can affect how your body processes dietary fats, triglycerides, and cholesterol. Heart attack and stroke are both medical emergencies. There are many things that you can do to help prevent heart disease and stroke:  Have your blood pressure checked at least every 1-2 years. High blood pressure  causes heart disease and increases the risk of stroke.  If you are 75-1 years old, ask your health care provider if you should take aspirin to prevent a heart attack or a stroke.  Do not use any tobacco products, including cigarettes, chewing tobacco, or electronic cigarettes. If you need help quitting, ask your health care provider.  It is important to eat a  healthy diet and maintain a healthy weight. ? Be sure to include plenty of vegetables, fruits, low-fat dairy products, and lean protein. ? Avoid eating foods that are high in solid fats, added sugars, or salt (sodium).  Get regular exercise. This is one of the most important things that you can do for your health. ? Try to exercise for at least 150 minutes each week. The type of exercise that you do should increase your heart rate and make you sweat. This is known as moderate-intensity exercise. ? Try to do strengthening exercises at least twice each week. Do these in addition to the moderate-intensity exercise.  Know your numbers.Ask your health care provider to check your cholesterol and your blood glucose. Continue to have your blood tested as directed by your health care provider.  What should I know about cancer screening? There are several types of cancer. Take the following steps to reduce your risk and to catch any cancer development as early as possible. Breast Cancer  Practice breast self-awareness. ? This means understanding how your breasts normally appear and feel. ? It also means doing regular breast self-exams. Let your health care provider know about any changes, no matter how small.  If you are 54 or older, have a clinician do a breast exam (clinical breast exam or CBE) every year. Depending on your age, family history, and medical history, it may be recommended that you also have a yearly breast X-ray (mammogram).  If you have a family history of breast cancer, talk with your health care provider about genetic screening.  If you are at high risk for breast cancer, talk with your health care provider about having an MRI and a mammogram every year.  Breast cancer (BRCA) gene test is recommended for women who have family members with BRCA-related cancers. Results of the assessment will determine the need for genetic counseling and BRCA1 and for BRCA2 testing. BRCA-related  cancers include these types: ? Breast. This occurs in males or females. ? Ovarian. ? Tubal. This may also be called fallopian tube cancer. ? Cancer of the abdominal or pelvic lining (peritoneal cancer). ? Prostate. ? Pancreatic.  Cervical, Uterine, and Ovarian Cancer Your health care provider may recommend that you be screened regularly for cancer of the pelvic organs. These include your ovaries, uterus, and vagina. This screening involves a pelvic exam, which includes checking for microscopic changes to the surface of your cervix (Pap test).  For women ages 21-65, health care providers may recommend a pelvic exam and a Pap test every three years. For women ages 71-65, they may recommend the Pap test and pelvic exam, combined with testing for human papilloma virus (HPV), every five years. Some types of HPV increase your risk of cervical cancer. Testing for HPV may also be done on women of any age who have unclear Pap test results.  Other health care providers may not recommend any screening for nonpregnant women who are considered low risk for pelvic cancer and have no symptoms. Ask your health care provider if a screening pelvic exam is right for you.  If you have had  past treatment for cervical cancer or a condition that could lead to cancer, you need Pap tests and screening for cancer for at least 20 years after your treatment. If Pap tests have been discontinued for you, your risk factors (such as having a new sexual partner) need to be reassessed to determine if you should start having screenings again. Some women have medical problems that increase the chance of getting cervical cancer. In these cases, your health care provider may recommend that you have screening and Pap tests more often.  If you have a family history of uterine cancer or ovarian cancer, talk with your health care provider about genetic screening.  If you have vaginal bleeding after reaching menopause, tell your health  care provider.  There are currently no reliable tests available to screen for ovarian cancer.  Lung Cancer Lung cancer screening is recommended for adults 41-57 years old who are at high risk for lung cancer because of a history of smoking. A yearly low-dose CT scan of the lungs is recommended if you:  Currently smoke.  Have a history of at least 30 pack-years of smoking and you currently smoke or have quit within the past 15 years. A pack-year is smoking an average of one pack of cigarettes per day for one year.  Yearly screening should:  Continue until it has been 15 years since you quit.  Stop if you develop a health problem that would prevent you from having lung cancer treatment.  Colorectal Cancer  This type of cancer can be detected and can often be prevented.  Routine colorectal cancer screening usually begins at age 72 and continues through age 1.  If you have risk factors for colon cancer, your health care provider may recommend that you be screened at an earlier age.  If you have a family history of colorectal cancer, talk with your health care provider about genetic screening.  Your health care provider may also recommend using home test kits to check for hidden blood in your stool.  A small camera at the end of a tube can be used to examine your colon directly (sigmoidoscopy or colonoscopy). This is done to check for the earliest forms of colorectal cancer.  Direct examination of the colon should be repeated every 5-10 years until age 34. However, if early forms of precancerous polyps or small growths are found or if you have a family history or genetic risk for colorectal cancer, you may need to be screened more often.  Skin Cancer  Check your skin from head to toe regularly.  Monitor any moles. Be sure to tell your health care provider: ? About any new moles or changes in moles, especially if there is a change in a mole's shape or color. ? If you have a mole  that is larger than the size of a pencil eraser.  If any of your family members has a history of skin cancer, especially at a young age, talk with your health care provider about genetic screening.  Always use sunscreen. Apply sunscreen liberally and repeatedly throughout the day.  Whenever you are outside, protect yourself by wearing long sleeves, pants, a wide-brimmed hat, and sunglasses.  What should I know about osteoporosis? Osteoporosis is a condition in which bone destruction happens more quickly than new bone creation. After menopause, you may be at an increased risk for osteoporosis. To help prevent osteoporosis or the bone fractures that can happen because of osteoporosis, the following is recommended:  If you  are 79-60 years old, get at least 1,000 mg of calcium and at least 600 mg of vitamin D per day.  If you are older than age 31 but younger than age 16, get at least 1,200 mg of calcium and at least 600 mg of vitamin D per day.  If you are older than age 24, get at least 1,200 mg of calcium and at least 800 mg of vitamin D per day.  Smoking and excessive alcohol intake increase the risk of osteoporosis. Eat foods that are rich in calcium and vitamin D, and do weight-bearing exercises several times each week as directed by your health care provider. What should I know about how menopause affects my mental health? Depression may occur at any age, but it is more common as you become older. Common symptoms of depression include:  Low or sad mood.  Changes in sleep patterns.  Changes in appetite or eating patterns.  Feeling an overall lack of motivation or enjoyment of activities that you previously enjoyed.  Frequent crying spells.  Talk with your health care provider if you think that you are experiencing depression. What should I know about immunizations? It is important that you get and maintain your immunizations. These include:  Tetanus, diphtheria, and pertussis  (Tdap) booster vaccine.  Influenza every year before the flu season begins.  Pneumonia vaccine.  Shingles vaccine.  Your health care provider may also recommend other immunizations. This information is not intended to replace advice given to you by your health care provider. Make sure you discuss any questions you have with your health care provider. Document Released: 05/30/2005 Document Revised: 10/26/2015 Document Reviewed: 01/09/2015 Elsevier Interactive Patient Education  2018 [employer].

## 2017-03-10 NOTE — Assessment & Plan Note (Signed)
Encouraged heart healthy diet, increase exercise, avoid trans fats, consider a krill oil cap daily 

## 2017-03-10 NOTE — Assessment & Plan Note (Signed)
hgba1c acceptable, minimize simple carbs. Increase exercise as tolerated. Continue current meds 

## 2017-03-10 NOTE — Assessment & Plan Note (Signed)
Recent itching improved some with Diflucan still some itching

## 2017-03-10 NOTE — Assessment & Plan Note (Signed)
Check UA and c and s

## 2017-03-10 NOTE — Assessment & Plan Note (Signed)
Encouraged to get adequate exercise, calcium and vitamin d intake 

## 2017-03-11 ENCOUNTER — Telehealth: Payer: Self-pay | Admitting: Family Medicine

## 2017-03-11 ENCOUNTER — Ambulatory Visit (HOSPITAL_BASED_OUTPATIENT_CLINIC_OR_DEPARTMENT_OTHER)
Admission: RE | Admit: 2017-03-11 | Discharge: 2017-03-11 | Disposition: A | Discharge status: Home / Self Care | Payer: Medicare HMO | Source: Ambulatory Visit | Attending: Family Medicine | Admitting: Family Medicine

## 2017-03-11 DIAGNOSIS — M5431 Sciatica, right side: Secondary | ICD-10-CM | POA: Insufficient documentation

## 2017-03-11 DIAGNOSIS — M25551 Pain in right hip: Secondary | ICD-10-CM | POA: Diagnosis not present

## 2017-03-11 LAB — URINE CULTURE
MICRO NUMBER:: 81308344
SPECIMEN QUALITY:: ADEQUATE

## 2017-03-11 NOTE — Telephone Encounter (Signed)
Copied from [place] #[id number]. Topic: Inquiry >> Mar 11, 2017 10:13 AM Neva Seat wrote: Pt is wanting to know what message is on her MyChart.  Wants to wait for grandson to help her get it set up or reset her password.  Please call pt back to let her know what her urine test results are.

## 2017-03-16 ENCOUNTER — Ambulatory Visit (INDEPENDENT_AMBULATORY_CARE_PROVIDER_SITE_OTHER): Payer: Medicare HMO | Admitting: Family Medicine

## 2017-03-16 ENCOUNTER — Encounter: Payer: Self-pay | Admitting: Family Medicine

## 2017-03-16 VITALS — BP 128/76 | Ht 65.0 in | Wt 180.0 lb

## 2017-03-16 DIAGNOSIS — M25551 Pain in right hip: Secondary | ICD-10-CM

## 2017-03-16 DIAGNOSIS — G5701 Lesion of sciatic nerve, right lower limb: Secondary | ICD-10-CM

## 2017-03-16 LAB — URINE CYTOLOGY ANCILLARY ONLY
Bacterial vaginitis: NEGATIVE
Candida vaginitis: NEGATIVE

## 2017-03-16 NOTE — Patient Instructions (Signed)
You have piriformis syndrome. Try to avoid painful activities when possible. Start physical therapy and do home exercises/stretches on days you don't go to therapy Pick 2-3 stretches where you feel the pull in the area of pain - do 3 of these and hold for 20-30 seconds twice a day. Standing hip rotations and hip side raises 3 sets of 10 once a day. Add ankle weights if these become too easy. Tylenol if needed for pain. Lidocaine patches may help as well. Tennis ball to massage area when sitting may be helpful. Follow up with me in 6 weeks.

## 2017-03-17 ENCOUNTER — Ambulatory Visit: Payer: Medicare HMO | Attending: Family Medicine | Admitting: Physical Therapy

## 2017-03-17 ENCOUNTER — Encounter: Payer: Self-pay | Admitting: Physical Therapy

## 2017-03-17 ENCOUNTER — Other Ambulatory Visit: Payer: Self-pay

## 2017-03-17 ENCOUNTER — Encounter: Payer: Self-pay | Admitting: Family Medicine

## 2017-03-17 DIAGNOSIS — M25551 Pain in right hip: Secondary | ICD-10-CM | POA: Insufficient documentation

## 2017-03-17 DIAGNOSIS — R29898 Other symptoms and signs involving the musculoskeletal system: Secondary | ICD-10-CM | POA: Diagnosis present

## 2017-03-17 DIAGNOSIS — M5441 Lumbago with sciatica, right side: Secondary | ICD-10-CM | POA: Diagnosis not present

## 2017-03-17 DIAGNOSIS — M6281 Muscle weakness (generalized): Secondary | ICD-10-CM | POA: Insufficient documentation

## 2017-03-17 NOTE — Progress Notes (Signed)
PCP: Mosie Lukes, MD  Subjective:   HPI: Patient is a 66 y.o. female here for right hip pain.  Patient reports for about a month she's had posterior right hip pain radiating to the groin and down back of leg to the knee. Pain level 5/10 and sharp. Pain worse when riding bike. Has not tried anything for this yet - discussed lidocaine patches with PCP. No numbness, skin changes.  Past Medical History:  Diagnosis Date  . Arthritis    "both knees" (07/02/2015)  . Cancer of right breast ([place])   . Claustrophobia   . Dysrhythmia    HX OF FAST HEART RATE AND PALPITATIONS - METOPROLOL HAS HELPED  . GERD (gastroesophageal reflux disease)   . H/O hiatal hernia   . H/O iritis    LEFT EYE - STATES HER EYE BECOMES RED AND VERY SENSITIVE TO LIGHT WHEN FLARE UP OF IRITIS  . Heart murmur    "benign; 06/2015"  . High cholesterol   . History of blood transfusion    "when I had my partial hysterectomy"  . History of chickenpox 09/26/2014  . History of measles   . History of MRSA infection   . History of shingles   . Hypertension   . Hypothyroidism   . Medicare annual wellness visit, initial 04/07/2015  . Multinodular thyroid    PT HAVING TROUBLE SWALLOWING  . Neuropathy    FEET  . Primary localized osteoarthritis of right knee   . Sciatica of right side 04/25/2015  . Spinal headache    spinal headache with epidural  . Type II diabetes mellitus ([facility])    ORAL MEDICATION - NO INSULIN  . Wears glasses     Current Outpatient Medications on File Prior to Visit  Medication Sig Dispense Refill  . aspirin 81 MG EC tablet Take 81 mg by mouth every other day. Swallow whole.    Marland Kitchen atorvastatin (LIPITOR) 40 MG tablet Take 1/2 tablet by mouth on Monday, Wednesday, Thursday, Friday and Sunday, then take 1 tablet by mouth on Tuesday and Saturday. 58 tablet 3  . AVAPRO 150 MG tablet TAKE 1/2-1 TAB (75-150 MG TOTAL ) BY MOUTH DAILY 90 tablet 2  . calcium-vitamin D (OSCAL WITH D) 250-125 MG-UNIT per  tablet Take 1 tablet by mouth daily.     . Cholecalciferol (VITAMIN D) 2000 UNITS CAPS Take 1 capsule by mouth every evening.     . Ciclopirox 1 % shampoo Apply 1 each topically every Friday.     . cycloSPORINE (RESTASIS) 0.05 % ophthalmic emulsion Place 1 drop into both eyes 2 (two) times daily.    Marland Kitchen diltiazem (TIAZAC) 360 MG 24 hr capsule TAKE 1 CAPSULE (360 MG TOTAL) BY MOUTH EVERY MORNING. 90 capsule 3  . DOCOSAHEXAENOIC ACID PO Take by mouth.    . esomeprazole (NEXIUM) 40 MG capsule TAKE ONE CAPSULE BY MOUTH IN THE MORNING 90 capsule 2  . Estradiol 10 MCG TABS vaginal tablet 1 tablet per vagina at bedtime QOD 30 tablet 12  . gabapentin (NEURONTIN) 300 MG capsule Take 1 capsule (300 mg total) by mouth at bedtime. 270 capsule 1  . glucose blood (ONE TOUCH TEST STRIPS) test strip Use as directed twice daily to check blood sugar. DX code E11.9 300 each 1  . levocetirizine (XYZAL) 5 MG tablet TAKE 1 TABLET (5 MG TOTAL) BY MOUTH EVERY EVENING FOR RUNNY NOSE OR ITCHING 90 tablet 2  . levothyroxine (SYNTHROID, LEVOTHROID) 75 MCG tablet TAKE 1 TABLET  BY MOUTH MONDAY THROUHG FRIDAY & 1/2 TAB ON SATURDAY & SUNDAY 90 tablet 0  . loteprednol (LOTEMAX) 0.5 % ophthalmic suspension Place 1 drop into the left eye daily as needed ("arthiritis of the eye").     . metFORMIN (GLUCOPHAGE-XR) 750 MG 24 hr tablet TAKE 1 TABLET IN AM AND 1 TABLET AT LUNCH 180 tablet 1  . metoprolol succinate (TOPROL-XL) 25 MG 24 hr tablet TAKE 1 TABLET BY MOUTH TWICE A DAY AT 12 NOON AND 4PM 180 tablet 1  . mometasone (NASONEX) 50 MCG/ACT nasal spray Place 2 sprays into the nose daily. 17 g 12  . Olopatadine HCl 0.2 % SOLN Place 1 drop into both eyes daily as needed.  11  . polyvinyl alcohol-povidone (REFRESH) 1.4-0.6 % ophthalmic solution Place 1 drop into both eyes 2 (two) times daily.    . Probiotic Product (PROBIOTIC DAILY PO) Take 1 capsule by mouth daily.    Marland Kitchen spironolactone (ALDACTONE) 25 MG tablet TAEK 1 & 1/2 TAB BY MOUTH  EVERY DAY 180 tablet 0  . UNABLE TO FIND Vitamin E Vaginal Suppository     No current facility-administered medications on file prior to visit.     Past Surgical History:  Procedure Laterality Date  . ABDOMINAL HYSTERECTOMY  1988  . ABDOMINOPLASTY  1986  . ANTERIOR AND POSTERIOR REPAIR  05/11/2012   Procedure: ANTERIOR (CYSTOCELE) AND POSTERIOR REPAIR (RECTOCELE);  Surgeon: Reece Packer, MD;  Location: [facility];  Service: Urology;;  with graft  . BREAST BIOPSY Right   . CARDIAC CATHETERIZATION  1999  . COLONOSCOPY    . CYSTOSCOPY WITH URETHRAL DILATATION  05/11/2012   Procedure: CYSTOSCOPY WITH URETHRAL DILATATION;  Surgeon: Reece Packer, MD;  Location: [facility];  Service: Urology;;  . ESOPHAGOGASTRODUODENOSCOPY    . HERNIA REPAIR    . MASTECTOMY Right 2007  . PLANTAR FASCIA SURGERY Left   . THYROIDECTOMY N/A 02/23/2014   Procedure: TOTAL THYROIDECTOMY;  Surgeon: Armandina Gemma, MD;  Location: [facility];  Service: General;  Laterality: N/A;  . TOTAL KNEE ARTHROPLASTY Right 07/02/2015  . TOTAL KNEE ARTHROPLASTY Right 07/02/2015   Procedure: TOTAL KNEE ARTHROPLASTY;  Surgeon: Elsie Saas, MD;  Location: [facility];  Service: Orthopedics;  Laterality: Right;  . TOTAL THYROIDECTOMY  1976   removed three tumor (2 on right; 1 on left)  . TUBAL LIGATION    . UMBILICAL HERNIA REPAIR  1986  . VAGINAL PROLAPSE REPAIR  05/11/2012   Procedure: VAGINAL VAULT SUSPENSION;  Surgeon: Reece Packer, MD;  Location: [facility];  Service: Urology;;  . Clayborne Dana / PERINEUM BIOPSY     vulvar    Allergies  Allergen Reactions  . Codeine Anxiety    Jittery   . Penicillins Itching  . Sulfa Antibiotics Itching  . Vicodin [Hydrocodone-Acetaminophen] Itching    ITCHING     Social History   Socioeconomic History  . Marital status: Married    Spouse name: Not on file  . Number of children: 3  . Years of education: Not on file  . Highest education level: Not on file  Social Needs  . Financial resource  strain: Not on file  . Food insecurity - worry: Not on file  . Food insecurity - inability: Not on file  . Transportation needs - medical: Not on file  . Transportation needs - non-medical: Not on file  Occupational History    Comment: Retired  Tobacco Use  . Smoking status: Never Smoker  . Smokeless tobacco:  Never Used  Substance and Sexual Activity  . Alcohol use: No    Alcohol/week: 0.0 oz  . Drug use: No  . Sexual activity: Yes    Birth control/protection: Surgical    Comment: lives with husband, retired from various jobs, diabetic diet  Other Topics Concern  . Not on file  Social History Narrative  . Not on file    Family History  Problem Relation Age of Onset  . Hypertension Mother   . Heart disease Mother        pacer, CHF  . Hyperlipidemia Mother   . Kidney disease Mother        1 kidney due to stone  . Kidney Stones Mother   . Hypertension Father   . Cancer Father        lung, psa  . Diabetes Father   . Alcohol abuse Father   . Diabetes Sister   . Cancer Sister   . Hypertension Sister   . Diabetes Brother   . Hypertension Brother   . Hyperlipidemia Brother   . Kidney disease Brother        dialysis  . Stroke Brother   . Hypertension Maternal Grandmother   . Heart disease Maternal Grandmother   . Stroke Maternal Grandmother   . Hypertension Maternal Grandfather   . Hypertension Daughter   . Arthritis Son   . Hyperlipidemia Sister   . Hypertension Sister   . Diabetes Sister   . Thyroid disease Sister   . Kidney disease Sister   . Cancer Sister        thyroid and breast  . Arthritis Sister        back pain  . Hypertension Sister   . Thyroid disease Sister   . Hypertension Brother   . Hyperlipidemia Brother   . Hypertension Brother   . Hyperlipidemia Brother   . Benign prostatic hyperplasia Brother   . Hypertension Daughter   . Hyperlipidemia Daughter   . Diabetes Daughter   . Hypertension Maternal Aunt   . Hypertension Maternal Uncle   .  Hypertension Paternal Aunt   . Cancer Paternal Aunt   . Hypertension Paternal Uncle     BP 128/76   Ht 5\' 5"  (1.651 m)   Wt 180 lb (81.6 kg)   BMI 29.95 kg/m   Review of Systems: See HPI above.     Objective:  Physical Exam:  Gen: NAD, comfortable in exam room  Back: No gross deformity, scoliosis. No TTP .  No midline or bony TTP. FROM without pain. Strength LEs 5/5 all muscle groups except right hip abduction 4/5 strength.   Negative SLRs. Sensation intact to light touch bilaterally.  Right hip: No gross deformity. TTP over hip external rotators, piriformis.  No other tenderness. FROM with 4/5 strength hip abduction. Negative logroll Positive piriformis. Negative fabers. NVI distally.   Assessment & Plan:  1. Right hip pain - 2/2 piriformis syndrome.  Shown home exercises and stretches to start and she will start physical therapy.  Tylenol, lidocaine patches if needed.  Tennis ball massage.  F/u in 6 weeks.

## 2017-03-17 NOTE — Patient Instructions (Signed)
Piriformis Stretch - Supine    Pull right knee across body toward opposite shoulder. Hold slight stretch for _30__ seconds.  Repeat _3__ times.      Piriformis Stretch, Supine    Lie supine, legs bent, feet flat. Raise one bent leg and, grasping ankle with both hands, pull leg toward opposite shoulder. Hold _30__ seconds.  Repeat _3__ times per session. Do _2__ sessions per day. Perform with other leg straight.  Supine: Leg Stretch With Strap (Advanced)    Lie on back with one knee bent, foot flat on floor. Hook strap around other foot. Straighten knee. Raise leg to maximal stretch and straighten knee further by tightening quadriceps muscle. Hold _30__ seconds. Warning: Intense stretch. Stay within tolerance.  Repeat __3_ times per session. Do _2__ sessions per day.    Bridge    Lie back, legs bent. Inhale, pressing hips up. Keeping ribs in, lengthen lower back. Exhale, rolling down along spine from top. Repeat __10__ times. Do __2__ sets per session.

## 2017-03-17 NOTE — Assessment & Plan Note (Signed)
2/2 piriformis syndrome.  Shown home exercises and stretches to start and she will start physical therapy.  Tylenol, lidocaine patches if needed.  Tennis ball massage.  F/u in 6 weeks.

## 2017-03-17 NOTE — Therapy (Signed)
[facility] [address]  [place], [place], [address] Phone: [phone]   Fax:  [phone]  Physical Therapy Evaluation  Patient Details  Name: Victoria Walker MRN: [id number] Date of Birth: 09-01-1950 Referring Provider: Dr. Karlton Lemon   Encounter Date: 03/17/2017  PT End of Session - 03/17/17 1614    Visit Number  1    Number of Visits  12    Date for PT Re-Evaluation  04/28/17    Authorization Type  Medicare, [place]    PT Start Time  1400    PT Stop Time  1448    PT Time Calculation (min)  48 min    Activity Tolerance  Patient tolerated treatment well    Behavior During Therapy  [facility] for tasks assessed/performed       Past Medical History:  Diagnosis Date  . Arthritis    "both knees" (07/02/2015)  . Cancer of right breast ([place])   . Claustrophobia   . Dysrhythmia    HX OF FAST HEART RATE AND PALPITATIONS - METOPROLOL HAS HELPED  . GERD (gastroesophageal reflux disease)   . H/O hiatal hernia   . H/O iritis    LEFT EYE - STATES HER EYE BECOMES RED AND VERY SENSITIVE TO LIGHT WHEN FLARE UP OF IRITIS  . Heart murmur    "benign; 06/2015"  . High cholesterol   . History of blood transfusion    "when I had my partial hysterectomy"  . History of chickenpox 09/26/2014  . History of measles   . History of MRSA infection   . History of shingles   . Hypertension   . Hypothyroidism   . Medicare annual wellness visit, initial 04/07/2015  . Multinodular thyroid    PT HAVING TROUBLE SWALLOWING  . Neuropathy    FEET  . Primary localized osteoarthritis of right knee   . Sciatica of right side 04/25/2015  . Spinal headache    spinal headache with epidural  . Type II diabetes mellitus (HCC)    ORAL MEDICATION - NO INSULIN  . Wears glasses     Past Surgical History:  Procedure Laterality Date  . ABDOMINAL HYSTERECTOMY  1988  . ABDOMINOPLASTY  1986  . ANTERIOR AND POSTERIOR REPAIR  05/11/2012   Procedure:  ANTERIOR (CYSTOCELE) AND POSTERIOR REPAIR (RECTOCELE);  Surgeon: Reece Packer, MD;  [facility];  Service: Urology;;  with graft  . BREAST BIOPSY Right   . CARDIAC CATHETERIZATION  1999  . COLONOSCOPY    . CYSTOSCOPY WITH URETHRAL DILATATION  05/11/2012   Procedure: CYSTOSCOPY WITH URETHRAL DILATATION;  Surgeon: Reece Packer, MD;  [facility];  Service: Urology;;  . ESOPHAGOGASTRODUODENOSCOPY    . HERNIA REPAIR    . MASTECTOMY Right 2007  . PLANTAR FASCIA SURGERY Left   . THYROIDECTOMY N/A 02/23/2014   Procedure: TOTAL THYROIDECTOMY;  Surgeon: Armandina Gemma, MD;  [facility];  Service: General;  Laterality: N/A;  . TOTAL KNEE ARTHROPLASTY Right 07/02/2015  . TOTAL KNEE ARTHROPLASTY Right 07/02/2015   Procedure: TOTAL KNEE ARTHROPLASTY;  Surgeon: Elsie Saas, MD;  Location: [facility];  Service: Orthopedics;  Laterality: Right;  . TOTAL THYROIDECTOMY  1976   removed three tumor (2 on right; 1 on left)  . TUBAL LIGATION    . UMBILICAL HERNIA REPAIR  1986  . VAGINAL PROLAPSE REPAIR  05/11/2012   Procedure: VAGINAL VAULT SUSPENSION;  Surgeon: Reece Packer, MD;  [facility];  Service:  Urology;;  . VULVA / PERINEUM BIOPSY     vulvar    There were no vitals filed for this visit.   Subjective Assessment - 03/17/17 1606    Subjective  Patient reporting significant pain in R glute/piriformis area that radiates around to R hip and groin. Onset of pain approx. 2-3 months ago with patient unable to recall any significant event that may have caused the pain. Pain is worse with prolonged sitting and riding in the car, with patient having to reposition herself almost constantly when sitting down. Patient is currently unable to lie on her back or R side due to pain and reports sharp, shooting pain down the back of her R leg frequently reaches 8/10.     Pertinent History  breast cancer, DM, HTN    Limitations  Sitting;House hold activities    How long can you sit  comfortably?  < 20 minutes    Patient Stated Goals  be able to sit and drive with no pain, go to the movies, return to biking at the gym    Currently in Pain?  Yes    Pain Score  2     Pain Location  Buttocks    Pain Orientation  Right    Pain Descriptors / Indicators  Sore    Pain Type  Acute pain    Pain Radiating Towards  R knee    Pain Onset  More than a month ago    Pain Frequency  Intermittent    Aggravating Factors   sitting, riding bike at gym, driving    Effect of Pain on Daily Activities  avoided normal exercises routine for several weeks         [facility] PT Assessment - 03/17/17 1403      Assessment   Medical Diagnosis  Piriformis syndrome of right side    Referring Provider  Dr. Karlton Lemon    Onset Date/Surgical Date  -- ~2-3 months ago    Next MD Visit  04/27/17    Prior Therapy  yes      Precautions   Precautions  None      Restrictions   Weight Bearing Restrictions  No      Balance Screen   Has the patient fallen in the past 6 months  No    Has the patient had a decrease in activity level because of a fear of falling?   No    Is the patient reluctant to leave their home because of a fear of falling?   No      Home Film/video editor residence    Living Arrangements  Spouse/significant other      Prior Function   Level of [place]  Retired    Leisure  reading, housework, tv, gym      Cognition   Overall Cognitive Status  Within Functional Limits for tasks assessed      Observation/Other Assessments   Focus on Therapeutic Outcomes (FOTO)   58 (42% limited, 31% predicted)      Sensation   Light Touch  Appears Intact      Coordination   Gross Motor Movements are Fluid and Coordinated  Yes      Functional Tests   Functional tests  Squat      Squat   Comments  poor technique, no pain      Posture/Postural Control   Posture/Postural Control  Postural limitations  Postural Limitations   Rounded Shoulders;Forward head      ROM / Strength   AROM / PROM / Strength  AROM;Strength      AROM   Overall AROM   Within functional limits for tasks performed    Overall AROM Comments  lumbar screen      Strength   Strength Assessment Site  Hip;Knee    Right/Left Hip  Right;Left    Right Hip Flexion  4-/5 pain in R groin    Right Hip ABduction  4-/5 pain in R groin    Right Hip ADduction  4/5    Left Hip Flexion  4+/5    Left Hip ABduction  4/5    Left Hip ADduction  4/5    Right/Left Knee  Right;Left    Right Knee Flexion  4/5    Right Knee Extension  4+/5    Left Knee Flexion  5/5    Left Knee Extension  5/5      Flexibility   Soft Tissue Assessment /Muscle Length  yes    Hamstrings  RLE; approx. 75 deg. with 90/90 test      Palpation   Palpation comment  TTP over R glute/piriformis, R GT area             Objective measurements completed on examination: See above findings.      [facility] Adult PT Treatment/Exercise - 03/17/17 1403      Exercises   Exercises  Lumbar;Knee/Hip      Lumbar Exercises: Stretches   Passive Hamstring Stretch  3 reps;30 seconds    Passive Hamstring Stretch Limitations  with strap    Piriformis Stretch  3 reps;30 seconds    Piriformis Stretch Limitations  KTOS      Lumbar Exercises: Supine   Bridge  10 reps heavy cueing for proper muscle activation             PT Education - 03/17/17 1614    Education provided  Yes    Education Details  exam findings, HEP, POC    Person(s) Educated  Patient    Methods  Explanation;Demonstration;Handout    Comprehension  Verbalized understanding;Returned demonstration       PT Short Term Goals - 03/17/17 1623      PT SHORT TERM GOAL #1   Title  Patient to be independent with initial HEP    Status  New    Target Date  03/31/17        PT Long Term Goals - 03/17/17 1623      PT LONG TERM GOAL #1   Title  Patient to be independent with advanced HEP    Status  New    Target  Date  04/28/17      PT LONG TERM GOAL #2   Title  Patient to increase B LE strength to >/=4+/5 to improve functional mobility    Status  New    Target Date  04/28/17      PT LONG TERM GOAL #3   Title  Patient to report ability to sit at home or in car >/= 1 hr with pain no greater than 2/10    Status  New    Target Date  04/28/17      PT LONG TERM GOAL #4   Title  Patient to report ability to return to biking and walking at the gym with pain no greater than 2/10    Status  New    Target Date  04/28/17      PT LONG TERM GOAL #5   Title  Patient to report ability to sleep through the night without pain limiting    Status  New    Target Date  04/28/17             Plan - 2017-04-05 1615    Clinical Impression Statement  Victoria Walker is a 66 y.o female presenting to OPPT for primary complaint of pain in R glute/piriformis area with radiating pain towards R knee. Patient having difficulty with normal daily activities that require sitting, difficulty sleeping due to positioning and is currently unable to perform her usual exercise routine due to pain. Patient demonstrating deficits in strength in R LE, minor tightness in R hip musculature and TTP along R piriformis muscle and hip joint. Patient would benefit from skilled PT services to address these deficits in order to improve patient functional mobility and QOL.     Clinical Presentation  Stable    Clinical Decision Making  Low    Rehab Potential  Good    PT Frequency  2x / week    PT Duration  6 weeks    PT Treatment/Interventions  ADLs/Self Care Home Management;Cryotherapy;Electrical Stimulation;Iontophoresis 4mg /ml Dexamethasone;Moist Heat;Traction;Ultrasound;Therapeutic exercise;Therapeutic activities;Functional mobility training;Neuromuscular re-education;Patient/family education;Passive range of motion;Manual techniques;Dry needling;Taping;Vasopneumatic Device    PT Next Visit Plan  assess patient compliance/tolerance to HEP,  begin LE strengthening program     Consulted and Agree with Plan of Care  Patient       Patient will benefit from skilled therapeutic intervention in order to improve the following deficits and impairments:  Pain, Decreased activity tolerance, Decreased endurance, Decreased range of motion, Decreased strength, Decreased mobility  Visit Diagnosis: Acute right-sided low back pain with right-sided sciatica  Other symptoms and signs involving the musculoskeletal system  Muscle weakness (generalized)  G-Codes - 05-Apr-2017 1629    Functional Assessment Tool Used (Outpatient Only)  58% (42% limited, 31% predicted)    Functional Limitation  Mobility: Walking and moving around    Mobility: Walking and Moving Around Current Status (E9937)  At least 40 percent but less than 60 percent impaired, limited or restricted    Mobility: Walking and Moving Around Goal Status [phone])  At least 20 percent but less than 40 percent impaired, limited or restricted        Problem List Patient Active Problem List   Diagnosis Date Noted  . Right hip pain 04-05-2017  . Urinary frequency 03/10/2017  . Primary localized osteoarthritis of right knee   . History of MRSA infection   . History of measles   . Dysrhythmia   . Chronic rhinitis 06/06/2015  . Shortness of breath 06/06/2015  . Vaginitis and vulvovaginitis 04/25/2015  . Sciatica of right side 04/25/2015  . Medicare annual wellness visit, initial 04/07/2015  . Allergic response 01/01/2015  . History of chickenpox 09/26/2014  . History of shingles   . 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 Deformity of metatarsal bone of left foot 03/03/2014  . Vulvar irritation  S/P neg vulvar bx 12/2013 01/23/2014  . 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 Right knee DJD 07/12/2013  . Neuralgia of left foot 06/24/2013  . Tendonitis of ankle, right 04/08/2013  . Vaginal dryness, menopausal 04/05/2013  . Pronation  deformity of ankle, acquired 02/09/2013  . Cortical cataract 02/01/2013  . H/O bone density study 01/26/2013  . Chest pain 01/05/2013  . History of  iritis 01/04/2013  . Dry eye syndrome 10/07/2012  . Meibomian gland disease 10/07/2012  . Gastroesophageal reflux disease without esophagitis 10/05/2012  . Essential hypertension 10/04/2012  . Hyperlipidemia, mixed 10/04/2012  . 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 Diabetic neuropathy ([place]) 10/04/2012  . S/P hysterectomy 10/04/2012  . Osteopenia 10/04/2012  . Hypothyroidism 10/04/2012  . History of cystocele 10/04/2012  . Dyspareunia in female 10/04/2012  . Plantar fasciitis 10/04/2012  . Nuclear sclerotic cataract 06/17/2012  . Diabetes mellitus ([place]) 03/02/2012  . Iritis 01/06/2012  . Neuropathy 01/06/2012  . Disease of thyroid gland 01/06/2012  . Breast cancer [facility]) 05/06/2011     Mickle Asper, SPT 03/17/17 4:51 PM    [facility] [address]  [place], [place], [address] Phone: [phone]   Fax:  [phone]  Name: Victoria Walker MRN: [id number] Date of Birth: 11/10/1950

## 2017-03-17 NOTE — Telephone Encounter (Signed)
Notes recorded by Magdalene Molly, RMA on 03/17/2017 at 12:57 PM EST Left message for patient to call back ------  Notes recorded by Mosie Lukes, MD on 03/16/2017 at 7:54 PM EST Negative yeast and bacteria overgrowth ------  Notes recorded by Mosie Lukes, MD on 03/15/2017 at 8:23 AM EST Notify only some strep seen in urine considered a normal contaminate. If she is symptomatic then amox 500 mg caps, 1 tid x 3d

## 2017-03-19 ENCOUNTER — Ambulatory Visit: Payer: Medicare HMO | Admitting: Physical Therapy

## 2017-03-19 ENCOUNTER — Encounter: Payer: Self-pay | Admitting: Physical Therapy

## 2017-03-19 ENCOUNTER — Ambulatory Visit (HOSPITAL_BASED_OUTPATIENT_CLINIC_OR_DEPARTMENT_OTHER)
Admission: RE | Admit: 2017-03-19 | Discharge: 2017-03-19 | Disposition: A | Discharge status: Home / Self Care | Payer: Medicare HMO | Source: Ambulatory Visit | Attending: Family Medicine | Admitting: Family Medicine

## 2017-03-19 DIAGNOSIS — R29898 Other symptoms and signs involving the musculoskeletal system: Secondary | ICD-10-CM

## 2017-03-19 DIAGNOSIS — M5441 Lumbago with sciatica, right side: Secondary | ICD-10-CM

## 2017-03-19 DIAGNOSIS — M8589 Other specified disorders of bone density and structure, multiple sites: Secondary | ICD-10-CM | POA: Diagnosis not present

## 2017-03-19 DIAGNOSIS — M8588 Other specified disorders of bone density and structure, other site: Secondary | ICD-10-CM | POA: Diagnosis not present

## 2017-03-19 DIAGNOSIS — M6281 Muscle weakness (generalized): Secondary | ICD-10-CM

## 2017-03-19 DIAGNOSIS — Z780 Asymptomatic menopausal state: Secondary | ICD-10-CM | POA: Diagnosis not present

## 2017-03-19 NOTE — Therapy (Signed)
[facility] [address]  [place], [place], [address] Phone: [phone]   Fax:  [phone]  Physical Therapy Treatment  Patient Details  Name: Victoria Walker MRN: [id number] Date of Birth: 12-25-1950 Referring Provider: Dr. Karlton Lemon   Encounter Date: 03/19/2017  PT End of Session - 03/19/17 1442    Visit Number  2    Number of Visits  12    Date for PT Re-Evaluation  04/28/17    Authorization Type  Medicare, [place]    PT Start Time  1355    PT Stop Time  1441    PT Time Calculation (min)  46 min    Activity Tolerance  Patient tolerated treatment well    Behavior During Therapy  [facility] for tasks assessed/performed       Past Medical History:  Diagnosis Date  . Arthritis    "both knees" (07/02/2015)  . Cancer of right breast ([place])   . Claustrophobia   . Dysrhythmia    HX OF FAST HEART RATE AND PALPITATIONS - METOPROLOL HAS HELPED  . GERD (gastroesophageal reflux disease)   . H/O hiatal hernia   . H/O iritis    LEFT EYE - STATES HER EYE BECOMES RED AND VERY SENSITIVE TO LIGHT WHEN FLARE UP OF IRITIS  . Heart murmur    "benign; 06/2015"  . High cholesterol   . History of blood transfusion    "when I had my partial hysterectomy"  . History of chickenpox 09/26/2014  . History of measles   . History of MRSA infection   . History of shingles   . Hypertension   . Hypothyroidism   . Medicare annual wellness visit, initial 04/07/2015  . Multinodular thyroid    PT HAVING TROUBLE SWALLOWING  . Neuropathy    FEET  . Primary localized osteoarthritis of right knee   . Sciatica of right side 04/25/2015  . Spinal headache    spinal headache with epidural  . Type II diabetes mellitus (HCC)    ORAL MEDICATION - NO INSULIN  . Wears glasses     Past Surgical History:  Procedure Laterality Date  . ABDOMINAL HYSTERECTOMY  1988  . ABDOMINOPLASTY  1986  . ANTERIOR AND POSTERIOR REPAIR  05/11/2012   Procedure:  ANTERIOR (CYSTOCELE) AND POSTERIOR REPAIR (RECTOCELE);  Surgeon: Reece Packer, MD;  [facility];  Service: Urology;;  with graft  . BREAST BIOPSY Right   . CARDIAC CATHETERIZATION  1999  . COLONOSCOPY    . CYSTOSCOPY WITH URETHRAL DILATATION  05/11/2012   Procedure: CYSTOSCOPY WITH URETHRAL DILATATION;  Surgeon: Reece Packer, MD;  [facility];  Service: Urology;;  . ESOPHAGOGASTRODUODENOSCOPY    . HERNIA REPAIR    . MASTECTOMY Right 2007  . PLANTAR FASCIA SURGERY Left   . THYROIDECTOMY N/A 02/23/2014   Procedure: TOTAL THYROIDECTOMY;  Surgeon: Armandina Gemma, MD;  [facility];  Service: General;  Laterality: N/A;  . TOTAL KNEE ARTHROPLASTY Right 07/02/2015  . TOTAL KNEE ARTHROPLASTY Right 07/02/2015   Procedure: TOTAL KNEE ARTHROPLASTY;  Surgeon: Elsie Saas, MD;  Location: [facility];  Service: Orthopedics;  Laterality: Right;  . TOTAL THYROIDECTOMY  1976   removed three tumor (2 on right; 1 on left)  . TUBAL LIGATION    . UMBILICAL HERNIA REPAIR  1986  . VAGINAL PROLAPSE REPAIR  05/11/2012   Procedure: VAGINAL VAULT SUSPENSION;  Surgeon: Reece Packer, MD;  [facility];  Service:  Urology;;  . VULVA / PERINEUM BIOPSY     vulvar    There were no vitals filed for this visit.  Subjective Assessment - 03/19/17 1353    Subjective  Patient reporting reduction in pain symptoms since initial visit. Reporting good compliance with HEP and able to drive yesterday 2-3 hours total with no pain.    Pertinent History  breast cancer, DM, HTN    Currently in Pain?  No/denies    Pain Score  0-No pain                      [facility] Adult PT Treatment/Exercise - 03/19/17 1403      Knee/Hip Exercises: Aerobic   Recumbent Bike  L2 x 6 min      Knee/Hip Exercises: Standing   Wall Squat  10 reps ball squeeze      Knee/Hip Exercises: Seated   Long Arc Quad  Right;Left;15 reps ball squeeze; 2# on ankles    Hamstring Curl  Right;Left;15 reps    Hamstring  Limitations  red tband      Knee/Hip Exercises: Supine   Straight Leg Raises  Right;Left;2 sets;10 reps 2#    Straight Leg Raise with External Rotation  Right;Left;2 sets;10 reps 2#      Knee/Hip Exercises: Sidelying   Hip ABduction  Right;Left;2 sets;10 reps 2#               PT Short Term Goals - 03/19/17 1553      PT SHORT TERM GOAL #1   Title  Patient to be independent with initial HEP    Status  On-going    Target Date  03/31/17        PT Long Term Goals - 03/19/17 1553      PT LONG TERM GOAL #1   Title  Patient to be independent with advanced HEP    Status  On-going    Target Date  04/28/17      PT LONG TERM GOAL #2   Title  Patient to increase B LE strength to >/=4+/5 to improve functional mobility    Status  On-going    Target Date  04/28/17      PT LONG TERM GOAL #3   Title  Patient to report ability to sit at home or in car >/= 1 hr with pain no greater than 2/10    Status  On-going    Target Date  04/28/17      PT LONG TERM GOAL #4   Title  Patient to report ability to return to biking and walking at the gym with pain no greater than 2/10    Status  On-going    Target Date  04/28/17      PT LONG TERM GOAL #5   Title  Patient to report ability to sleep through the night without pain limiting    Status  On-going    Target Date  04/28/17            Plan - 03/19/17 1442    Clinical Impression Statement  Mrs. Victoria Walker doing well with all strengthening exercises today. Some cueing necessary to maintain proper technique with exercises. Patient discussing returning to [employer] program at her gym soon. Will plan to go over safe activities for patient to do at the gym in upcoming visits. Will continue to progress strengthening to patient tolerance.     PT Treatment/Interventions  ADLs/Self Care Home Management;Cryotherapy;Electrical Stimulation;Iontophoresis 4mg /ml Dexamethasone;Moist Heat;Traction;Ultrasound;Therapeutic exercise;Therapeutic  activities;Functional mobility training;Neuromuscular re-education;Patient/family education;Passive range of motion;Manual techniques;Dry needling;Taping;Vasopneumatic Device    Consulted and Agree with Plan of Care  Patient       Patient will benefit from skilled therapeutic intervention in order to improve the following deficits and impairments:  Pain, Decreased activity tolerance, Decreased endurance, Decreased range of motion, Decreased strength, Decreased mobility  Visit Diagnosis: Acute right-sided low back pain with right-sided sciatica  Other symptoms and signs involving the musculoskeletal system  Muscle weakness (generalized)     Problem List Patient Active Problem List   Diagnosis Date Noted  . Right hip pain 03/17/2017  . Urinary frequency 03/10/2017  . Primary localized osteoarthritis of right knee   . History of MRSA infection   . History of measles   . Dysrhythmia   . Chronic rhinitis 06/06/2015  . Shortness of breath 06/06/2015  . Vaginitis and vulvovaginitis 04/25/2015  . Sciatica of right side 04/25/2015  . Medicare annual wellness visit, initial 04/07/2015  . Allergic response 01/01/2015  . History of chickenpox 09/26/2014  . History of shingles   . 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 Deformity of metatarsal bone of left foot 03/03/2014  . Vulvar irritation  S/P neg vulvar bx 12/2013 01/23/2014  . 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 Right knee DJD 07/12/2013  . Neuralgia of left foot 06/24/2013  . Tendonitis of ankle, right 04/08/2013  . Vaginal dryness, menopausal 04/05/2013  . Pronation deformity of ankle, acquired 02/09/2013  . Cortical cataract 02/01/2013  . H/O bone density study 01/26/2013  . Chest pain 01/05/2013  . History of iritis 01/04/2013  . Dry eye syndrome 10/07/2012  . Meibomian gland disease 10/07/2012  . Gastroesophageal reflux disease without esophagitis 10/05/2012  .  Essential hypertension 10/04/2012  . Hyperlipidemia, mixed 10/04/2012  . 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 Diabetic neuropathy ([place]) 10/04/2012  . S/P hysterectomy 10/04/2012  . Osteopenia 10/04/2012  . Hypothyroidism 10/04/2012  . History of cystocele 10/04/2012  . Dyspareunia in female 10/04/2012  . Plantar fasciitis 10/04/2012  . Nuclear sclerotic cataract 06/17/2012  . Diabetes mellitus ([place]) 03/02/2012  . Iritis 01/06/2012  . Neuropathy 01/06/2012  . Disease of thyroid gland 01/06/2012  . Breast cancer [facility]) 05/06/2011     Mickle Asper, SPT 03/19/17 3:56 PM    [facility] [address]  [place], [place], [address] Phone: [phone]   Fax:  [phone]  Name: Laporcha Marchesi MRN: [id number] Date of Birth: [birth year]

## 2017-03-19 NOTE — Patient Instructions (Signed)
Hip Abduction: Modified    Lying on right side with pillow between thighs, raise top leg from pillow, toes pointing straight forward. Repeat _10___ times per set. Do __2__ sets per session.     Straight Leg Raise: With External Leg Rotation    Lie on back with right leg straight, opposite leg bent.  Repeat __10__ times per set. Do __2__ sets per session. Perform on both legs.    Straight Leg Raise    Tighten stomach and slowly raise locked leg. Repeat __10__ times per set. Do __2__ sets per session.  Perform on both legs.

## 2017-03-24 ENCOUNTER — Encounter: Payer: Medicare HMO | Admitting: Physical Therapy

## 2017-03-24 ENCOUNTER — Ambulatory Visit: Payer: Medicare HMO | Attending: Family Medicine | Admitting: Physical Therapy

## 2017-03-24 ENCOUNTER — Encounter: Payer: Self-pay | Admitting: Physical Therapy

## 2017-03-24 DIAGNOSIS — R29898 Other symptoms and signs involving the musculoskeletal system: Secondary | ICD-10-CM | POA: Insufficient documentation

## 2017-03-24 DIAGNOSIS — M5441 Lumbago with sciatica, right side: Secondary | ICD-10-CM | POA: Diagnosis present

## 2017-03-24 DIAGNOSIS — M6281 Muscle weakness (generalized): Secondary | ICD-10-CM | POA: Insufficient documentation

## 2017-03-24 NOTE — Therapy (Signed)
[facility] [address]  [place], [place], [address] Phone: [phone]   Fax:  [phone]  Physical Therapy Treatment  Patient Details  Name: Victoria Walker MRN: [id number] Date of Birth: 09-Jul-1950 Referring Provider: Dr. Karlton Lemon   Encounter Date: 03/24/2017  PT End of Session - 03/24/17 0843    Visit Number  3    Number of Visits  12    Date for PT Re-Evaluation  04/28/17    Authorization Type  Medicare, [place] [facility]    PT Start Time  [id number]    PT Stop Time  0926    PT Time Calculation (min)  43 min    Activity Tolerance  Patient tolerated treatment well    Behavior During Therapy  [facility] for tasks assessed/performed       Past Medical History:  Diagnosis Date  . Arthritis    "both knees" (07/02/2015)  . Cancer of right breast ([place])   . Claustrophobia   . Dysrhythmia    HX OF FAST HEART RATE AND PALPITATIONS - METOPROLOL HAS HELPED  . GERD (gastroesophageal reflux disease)   . H/O hiatal hernia   . H/O iritis    LEFT EYE - STATES HER EYE BECOMES RED AND VERY SENSITIVE TO LIGHT WHEN FLARE UP OF IRITIS  . Heart murmur    "benign; 06/2015"  . High cholesterol   . History of blood transfusion    "when I had my partial hysterectomy"  . History of chickenpox 09/26/2014  . History of measles   . History of MRSA infection   . History of shingles   . Hypertension   . Hypothyroidism   . Medicare annual wellness visit, initial 04/07/2015  . Multinodular thyroid    PT HAVING TROUBLE SWALLOWING  . Neuropathy    FEET  . Primary localized osteoarthritis of right knee   . Sciatica of right side 04/25/2015  . Spinal headache    spinal headache with epidural  . Type II diabetes mellitus (HCC)    ORAL MEDICATION - NO INSULIN  . Wears glasses     Past Surgical History:  Procedure Laterality Date  . ABDOMINAL HYSTERECTOMY  1988  . ABDOMINOPLASTY  1986  . ANTERIOR AND POSTERIOR REPAIR  05/11/2012   Procedure:  ANTERIOR (CYSTOCELE) AND POSTERIOR REPAIR (RECTOCELE);  Surgeon: Reece Packer, MD;  Location: [facility];  Service: Urology;;  with graft  . BREAST BIOPSY Right   . CARDIAC CATHETERIZATION  1999  . COLONOSCOPY    . CYSTOSCOPY WITH URETHRAL DILATATION  05/11/2012   Procedure: CYSTOSCOPY WITH URETHRAL DILATATION;  Surgeon: Reece Packer, MD;  [facility];  Service: Urology;;  . ESOPHAGOGASTRODUODENOSCOPY    . HERNIA REPAIR    . MASTECTOMY Right 2007  . PLANTAR FASCIA SURGERY Left   . THYROIDECTOMY N/A 02/23/2014   Procedure: TOTAL THYROIDECTOMY;  Surgeon: Armandina Gemma, MD;  [facility];  Service: General;  Laterality: N/A;  . TOTAL KNEE ARTHROPLASTY Right 07/02/2015  . TOTAL KNEE ARTHROPLASTY Right 07/02/2015   Procedure: TOTAL KNEE ARTHROPLASTY;  Surgeon: Elsie Saas, MD;  Location: [facility];  Service: Orthopedics;  Laterality: Right;  . TOTAL THYROIDECTOMY  1976   removed three tumor (2 on right; 1 on left)  . TUBAL LIGATION    . UMBILICAL HERNIA REPAIR  1986  . VAGINAL PROLAPSE REPAIR  05/11/2012   Procedure: VAGINAL VAULT SUSPENSION;  Surgeon: Reece Packer, MD;  [facility];  Service: Urology;;  . Clayborne Dana / PERINEUM BIOPSY     vulvar    There were no vitals filed for this visit.  Subjective Assessment - 03/24/17 0845    Subjective  Pt reporting some pain/pulling with stretches at home. Still notes limited tolerance for prolonged sitting in church or when driving.    Pertinent History  breast cancer, DM, HTN    Patient Stated Goals  be able to sit and drive with no pain, go to the movies, return to biking at the gym    Currently in Pain?  No/denies    Pain Score  0-No pain                      [facility] Adult PT Treatment/Exercise - 03/24/17 0843      Self-Care   Self-Care  Posture    Posture  posture & body mechanics education (refer to handout)      Exercises   Exercises  Lumbar;Knee/Hip      Lumbar Exercises: Stretches   Piriformis  Stretch  30 seconds;2 reps    Piriformis Stretch Limitations  KTOS with heel resting on opposite knee      Lumbar Exercises: Supine   Clam  10 reps;3 seconds    Clam Limitations  abd bracing + alt hip ABD/ ER with red TB    Bent Knee Raise  10 reps;3 seconds    Bent Knee Raise Limitations  brace marching with red TB    Bridge  10 reps    Bridge Limitations  cues for abd bracing & glute squeeze before initiating lift into bridge      Lumbar Exercises: Sidelying   Clam  10 reps;3 seconds    Clam Limitations  red TB      Knee/Hip Exercises: Aerobic   Recumbent Bike  L2 x 6 min      Knee/Hip Exercises: Supine   Straight Leg Raises  Both;10 reps;Strengthening    Straight Leg Raises Limitations  2#, cues for abdominal bracing & to make sure opposite knee flexed - pain/"pulling" reported with HEP performance resolved with this    Straight Leg Raise with External Rotation  Both;10 reps;Strengthening    Straight Leg Raise with External Rotation Limitations  2#, cues for abdominal bracing & to make sure opposite knee flexed - pain/"pulling" reported with HEP performance resolved with this             PT Education - 03/24/17 0926    Education provided  Yes    Education Details  Posture and body mechanics with normal daily tasks and household chores    Person(s) Educated  Patient    Methods  Explanation;Demonstration;Handout    Comprehension  Verbalized understanding;Returned demonstration;Need further instruction       PT Short Term Goals - 03/19/17 1553      PT SHORT TERM GOAL #1   Title  Patient to be independent with initial HEP    Status  On-going    Target Date  03/31/17        PT Long Term Goals - 03/19/17 1553      PT LONG TERM GOAL #1   Title  Patient to be independent with advanced HEP    Status  On-going    Target Date  04/28/17      PT LONG TERM GOAL #2   Title  Patient to increase B LE strength to >/=4+/5 to improve functional mobility    Status  On-going  Target Date  04/28/17      PT LONG TERM GOAL #3   Title  Patient to report ability to sit at home or in car >/= 1 hr with pain no greater than 2/10    Status  On-going    Target Date  04/28/17      PT LONG TERM GOAL #4   Title  Patient to report ability to return to biking and walking at the gym with pain no greater than 2/10    Status  On-going    Target Date  04/28/17      PT LONG TERM GOAL #5   Title  Patient to report ability to sleep through the night without pain limiting    Status  On-going    Target Date  04/28/17            Plan - 03/24/17 0847    Clinical Impression Statement  Pt reporting some concerns with pain/"pulling" sensation when completing HEP therefore reviewed exercises in question (SLR's) and determined pt attempting to perfrom these with opposite leg lying out straight - cues provided for positioning and abdominal bracing with pt able to perform exercises w/o pain. Pt also reporting difficulty supporting leg during KTOS stretch, therefore had pt place ankle on stretching leg on flexed opposite knee with pt reporting better tolerance for stretch with this modification. Good tolerance for progression of lumbopelvic strengthening during remainder of session, but no further updates provided to HEP to allow pt to determine if current modifications improving exercise tolerance at home.    Rehab Potential  Good    PT Treatment/Interventions  ADLs/Self Care Home Management;Cryotherapy;Electrical Stimulation;Iontophoresis 4mg /ml Dexamethasone;Moist Heat;Traction;Ultrasound;Therapeutic exercise;Therapeutic activities;Functional mobility training;Neuromuscular re-education;Patient/family education;Passive range of motion;Manual techniques;Dry needling;Taping;Vasopneumatic Device    Consulted and Agree with Plan of Care  Patient       Patient will benefit from skilled therapeutic intervention in order to improve the following deficits and impairments:  Pain, Decreased  activity tolerance, Decreased endurance, Decreased range of motion, Decreased strength, Decreased mobility  Visit Diagnosis: Acute right-sided low back pain with right-sided sciatica  Other symptoms and signs involving the musculoskeletal system  Muscle weakness (generalized)     Problem List Patient Active Problem List   Diagnosis Date Noted  . Right hip pain 03/17/2017  . Urinary frequency 03/10/2017  . Primary localized osteoarthritis of right knee   . History of MRSA infection   . History of measles   . Dysrhythmia   . Chronic rhinitis 06/06/2015  . Shortness of breath 06/06/2015  . Vaginitis and vulvovaginitis 04/25/2015  . Sciatica of right side 04/25/2015  . Medicare annual wellness visit, initial 04/07/2015  . Allergic response 01/01/2015  . History of chickenpox 09/26/2014  . History of shingles   . 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 Deformity of metatarsal bone of left foot 03/03/2014  . Vulvar irritation  S/P neg vulvar bx 12/2013 01/23/2014  . 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 Right knee DJD 07/12/2013  . Neuralgia of left foot 06/24/2013  . Tendonitis of ankle, right 04/08/2013  . Vaginal dryness, menopausal 04/05/2013  . Pronation deformity of ankle, acquired 02/09/2013  . Cortical cataract 02/01/2013  . H/O bone density study 01/26/2013  . Chest pain 01/05/2013  . History of iritis 01/04/2013  . Dry eye syndrome 10/07/2012  . Meibomian gland disease 10/07/2012  . Gastroesophageal reflux disease without esophagitis 10/05/2012  . Essential hypertension 10/04/2012  . Hyperlipidemia,  mixed 10/04/2012  . 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 Diabetic neuropathy ([place]) 10/04/2012  . S/P hysterectomy 10/04/2012  . Osteopenia 10/04/2012  . Hypothyroidism 10/04/2012  . History of cystocele 10/04/2012  . Dyspareunia in female 10/04/2012  . Plantar fasciitis 10/04/2012  . Nuclear sclerotic cataract  06/17/2012  . Diabetes mellitus ([place]) 03/02/2012  . Iritis 01/06/2012  . Neuropathy 01/06/2012  . Disease of thyroid gland 01/06/2012  . Breast cancer ([place]) 05/06/2011    Percival Spanish, PT, MPt 03/24/2017, 6:42 PM  [facility] [address]  [place], [place], [address] Phone: [phone]   Fax:  [phone]  Name: Victoria Walker MRN: [id number] Date of Birth: 06-10-1950

## 2017-03-24 NOTE — Patient Instructions (Signed)

## 2017-03-26 ENCOUNTER — Encounter: Payer: Self-pay | Admitting: Physical Therapy

## 2017-03-26 ENCOUNTER — Encounter: Payer: Medicare HMO | Admitting: Physical Therapy

## 2017-03-26 ENCOUNTER — Ambulatory Visit: Payer: Medicare HMO | Admitting: Physical Therapy

## 2017-03-26 DIAGNOSIS — R29898 Other symptoms and signs involving the musculoskeletal system: Secondary | ICD-10-CM | POA: Diagnosis not present

## 2017-03-26 DIAGNOSIS — M6281 Muscle weakness (generalized): Secondary | ICD-10-CM

## 2017-03-26 DIAGNOSIS — M5441 Lumbago with sciatica, right side: Secondary | ICD-10-CM | POA: Diagnosis not present

## 2017-03-26 NOTE — Therapy (Signed)
[facility] [address]  [place], [place], [address] Phone: [phone]   Fax:  [phone]  Physical Therapy Treatment  Patient Details  Name: Victoria Walker MRN: [id number] Date of Birth: [birth year] Referring Provider: Dr. Karlton Lemon   Encounter Date: 03/26/2017  PT End of Session - 03/26/17 1729    Visit Number  4    Number of Visits  12    Date for PT Re-Evaluation  04/28/17    Authorization Type  Medicare, [place] [facility]    PT Start Time  1700    PT Stop Time  1746    PT Time Calculation (min)  46 min    Activity Tolerance  Patient tolerated treatment well    Behavior During Therapy  [facility] for tasks assessed/performed       Past Medical History:  Diagnosis Date  . Arthritis    "both knees" (07/02/2015)  . Cancer of right breast ([place])   . Claustrophobia   . Dysrhythmia    HX OF FAST HEART RATE AND PALPITATIONS - METOPROLOL HAS HELPED  . GERD (gastroesophageal reflux disease)   . H/O hiatal hernia   . H/O iritis    LEFT EYE - STATES HER EYE BECOMES RED AND VERY SENSITIVE TO LIGHT WHEN FLARE UP OF IRITIS  . Heart murmur    "benign; 06/2015"  . High cholesterol   . History of blood transfusion    "when I had my partial hysterectomy"  . History of chickenpox 09/26/2014  . History of measles   . History of MRSA infection   . History of shingles   . Hypertension   . Hypothyroidism   . Medicare annual wellness visit, initial 04/07/2015  . Multinodular thyroid    PT HAVING TROUBLE SWALLOWING  . Neuropathy    FEET  . Primary localized osteoarthritis of right knee   . Sciatica of right side 04/25/2015  . Spinal headache    spinal headache with epidural  . Type II diabetes mellitus (HCC)    ORAL MEDICATION - NO INSULIN  . Wears glasses     Past Surgical History:  Procedure Laterality Date  . ABDOMINAL HYSTERECTOMY  1988  . ABDOMINOPLASTY  1986  . ANTERIOR AND POSTERIOR REPAIR  05/11/2012   Procedure:  ANTERIOR (CYSTOCELE) AND POSTERIOR REPAIR (RECTOCELE);  Surgeon: Reece Packer, MD;  [facility];  Service: Urology;;  with graft  . BREAST BIOPSY Right   . CARDIAC CATHETERIZATION  1999  . COLONOSCOPY    . CYSTOSCOPY WITH URETHRAL DILATATION  05/11/2012   Procedure: CYSTOSCOPY WITH URETHRAL DILATATION;  Surgeon: Reece Packer, MD;  [facility];  Service: Urology;;  . ESOPHAGOGASTRODUODENOSCOPY    . HERNIA REPAIR    . MASTECTOMY Right 2007  . PLANTAR FASCIA SURGERY Left   . THYROIDECTOMY N/A 02/23/2014   Procedure: TOTAL THYROIDECTOMY;  Surgeon: Armandina Gemma, MD;  [facility];  Service: General;  Laterality: N/A;  . TOTAL KNEE ARTHROPLASTY Right 07/02/2015  . TOTAL KNEE ARTHROPLASTY Right 07/02/2015   Procedure: TOTAL KNEE ARTHROPLASTY;  Surgeon: Elsie Saas, MD;  Location: [facility];  Service: Orthopedics;  Laterality: Right;  . TOTAL THYROIDECTOMY  1976   removed three tumor (2 on right; 1 on left)  . TUBAL LIGATION    . UMBILICAL HERNIA REPAIR  1986  . VAGINAL PROLAPSE REPAIR  05/11/2012   Procedure: VAGINAL VAULT SUSPENSION;  Surgeon: Reece Packer, MD;  [facility];  Service: Urology;;  . Clayborne Dana / PERINEUM BIOPSY     vulvar    There were no vitals filed for this visit.  Subjective Assessment - 03/26/17 1655    Subjective  Patient continues to report improvements in pain. Was able to ride in the car to [facility] and back this morning with no reports of pain.     Pertinent History  breast cancer, DM, HTN    Currently in Pain?  No/denies    Pain Score  0-No pain                      [facility] Adult PT Treatment/Exercise - 03/26/17 1712      Lumbar Exercises: Supine   Bent Knee Raise  10 reps;3 seconds    Isometric Hip Flexion  10 reps;5 seconds      Knee/Hip Exercises: Aerobic   Recumbent Bike  L2 x 6 min      Knee/Hip Exercises: Machines for Strengthening   Cybex Knee Extension  B 20#; 15 reps    Cybex Knee Flexion  B 20#; 15 reps       Knee/Hip Exercises: Standing   Hip Flexion  Right;Left;10 reps red tband; ski poles UE support    Hip ADduction  Right;Left;10 reps;Limitations    Hip ADduction Limitations  red tband; ski poles UE support    Hip Abduction  Right;Left;10 reps    Abduction Limitations  red tband; ski poles UE support    Hip Extension  Right;Left;10 reps    Extension Limitations  red tband; ski poles UE support    Other Standing Knee Exercises  monster walk fwd/bwd; 2 x 39ft each; red tband               PT Short Term Goals - 03/19/17 1553      PT SHORT TERM GOAL #1   Title  Patient to be independent with initial HEP    Status  On-going    Target Date  03/31/17        PT Long Term Goals - 03/19/17 1553      PT LONG TERM GOAL #1   Title  Patient to be independent with advanced HEP    Status  On-going    Target Date  04/28/17      PT LONG TERM GOAL #2   Title  Patient to increase B LE strength to >/=4+/5 to improve functional mobility    Status  On-going    Target Date  04/28/17      PT LONG TERM GOAL #3   Title  Patient to report ability to sit at home or in car >/= 1 hr with pain no greater than 2/10    Status  On-going    Target Date  04/28/17      PT LONG TERM GOAL #4   Title  Patient to report ability to return to biking and walking at the gym with pain no greater than 2/10    Status  On-going    Target Date  04/28/17      PT LONG TERM GOAL #5   Title  Patient to report ability to sleep through the night without pain limiting    Status  On-going    Target Date  04/28/17            Plan - 03/26/17 1730    Clinical Impression Statement  Continued to progress patient core work and proximal hip strengthening today with good patient  tolerance and understanding. Patient having no reports of pain throughout session today. Patient stating she is planning to return to gym next week. Advised patient to continue with stretching daily. Will continue to progress strengthening  as patient tolerates.     PT Treatment/Interventions  ADLs/Self Care Home Management;Cryotherapy;Electrical Stimulation;Iontophoresis 4mg /ml Dexamethasone;Moist Heat;Traction;Ultrasound;Therapeutic exercise;Therapeutic activities;Functional mobility training;Neuromuscular re-education;Patient/family education;Passive range of motion;Manual techniques;Dry needling;Taping;Vasopneumatic Device    Consulted and Agree with Plan of Care  Patient       Patient will benefit from skilled therapeutic intervention in order to improve the following deficits and impairments:  Pain, Decreased activity tolerance, Decreased endurance, Decreased range of motion, Decreased strength, Decreased mobility  Visit Diagnosis: Acute right-sided low back pain with right-sided sciatica  Other symptoms and signs involving the musculoskeletal system  Muscle weakness (generalized)     Problem List Patient Active Problem List   Diagnosis Date Noted  . Right hip pain 03/17/2017  . Urinary frequency 03/10/2017  . Primary localized osteoarthritis of right knee   . History of MRSA infection   . History of measles   . Dysrhythmia   . Chronic rhinitis 06/06/2015  . Shortness of breath 06/06/2015  . Vaginitis and vulvovaginitis 04/25/2015  . Sciatica of right side 04/25/2015  . Medicare annual wellness visit, initial 04/07/2015  . Allergic response 01/01/2015  . History of chickenpox 09/26/2014  . History of shingles   . 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 Deformity of metatarsal bone of left foot 03/03/2014  . Vulvar irritation  S/P neg vulvar bx 12/2013 01/23/2014  . 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 Right knee DJD 07/12/2013  . Neuralgia of left foot 06/24/2013  . Tendonitis of ankle, right 04/08/2013  . Vaginal dryness, menopausal 04/05/2013  . Pronation deformity of ankle, acquired 02/09/2013  . Cortical cataract 02/01/2013  . H/O bone density  study 01/26/2013  . Chest pain 01/05/2013  . History of iritis 01/04/2013  . Dry eye syndrome 10/07/2012  . Meibomian gland disease 10/07/2012  . Gastroesophageal reflux disease without esophagitis 10/05/2012  . Essential hypertension 10/04/2012  . Hyperlipidemia, mixed 10/04/2012  . 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 Diabetic neuropathy ([place]) 10/04/2012  . S/P hysterectomy 10/04/2012  . Osteopenia 10/04/2012  . Hypothyroidism 10/04/2012  . History of cystocele 10/04/2012  . Dyspareunia in female 10/04/2012  . Plantar fasciitis 10/04/2012  . Nuclear sclerotic cataract 06/17/2012  . Diabetes mellitus ([place]) 03/02/2012  . Iritis 01/06/2012  . Neuropathy 01/06/2012  . Disease of thyroid gland 01/06/2012  . Breast cancer [facility]) 05/06/2011     Mickle Asper, SPT 03/26/17 5:49 PM    [facility] [address]  [place], [place], [address] Phone: [phone]   Fax:  [phone]  Name: Victoria Walker MRN: [id number] Date of Birth: Sep 07, 1950

## 2017-03-31 ENCOUNTER — Encounter: Payer: Medicare HMO | Admitting: Physical Therapy

## 2017-03-31 ENCOUNTER — Ambulatory Visit: Payer: Medicare HMO | Admitting: Physical Therapy

## 2017-04-02 ENCOUNTER — Encounter: Payer: Medicare HMO | Admitting: Physical Therapy

## 2017-04-02 ENCOUNTER — Encounter: Payer: Self-pay | Admitting: Physical Therapy

## 2017-04-02 ENCOUNTER — Ambulatory Visit: Payer: Medicare HMO | Admitting: Physical Therapy

## 2017-04-02 DIAGNOSIS — M5441 Lumbago with sciatica, right side: Secondary | ICD-10-CM

## 2017-04-02 DIAGNOSIS — M6281 Muscle weakness (generalized): Secondary | ICD-10-CM

## 2017-04-02 DIAGNOSIS — R29898 Other symptoms and signs involving the musculoskeletal system: Secondary | ICD-10-CM

## 2017-04-02 NOTE — Therapy (Signed)
[facility] [address]  [place], [place], [address] Phone: [phone]   Fax:  [phone]  Physical Therapy Treatment  Patient Details  Name: Victoria Walker MRN: [id number] Date of Birth: 1951-04-12 Referring Provider: Dr. Karlton Lemon   Encounter Date: 04/02/2017  PT End of Session - 04/02/17 0841    Visit Number  5    Number of Visits  12    Date for PT Re-Evaluation  04/28/17    Authorization Type  Medicare, [facility]    PT Start Time  [phone]    PT Stop Time  0930    PT Time Calculation (min)  49 min    Activity Tolerance  Patient tolerated treatment well    Behavior During Therapy  [facility] for tasks assessed/performed       Past Medical History:  Diagnosis Date  . Arthritis    "both knees" (07/02/2015)  . Cancer of right breast ([place])   . Claustrophobia   . Dysrhythmia    HX OF FAST HEART RATE AND PALPITATIONS - METOPROLOL HAS HELPED  . GERD (gastroesophageal reflux disease)   . H/O hiatal hernia   . H/O iritis    LEFT EYE - STATES HER EYE BECOMES RED AND VERY SENSITIVE TO LIGHT WHEN FLARE UP OF IRITIS  . Heart murmur    "benign; 06/2015"  . High cholesterol   . History of blood transfusion    "when I had my partial hysterectomy"  . History of chickenpox 09/26/2014  . History of measles   . History of MRSA infection   . History of shingles   . Hypertension   . Hypothyroidism   . Medicare annual wellness visit, initial 04/07/2015  . Multinodular thyroid    PT HAVING TROUBLE SWALLOWING  . Neuropathy    FEET  . Primary localized osteoarthritis of right knee   . Sciatica of right side 04/25/2015  . Spinal headache    spinal headache with epidural  . Type II diabetes mellitus (HCC)    ORAL MEDICATION - NO INSULIN  . Wears glasses     Past Surgical History:  Procedure Laterality Date  . ABDOMINAL HYSTERECTOMY  1988  . ABDOMINOPLASTY  1986  . ANTERIOR AND POSTERIOR REPAIR  05/11/2012   Procedure:  ANTERIOR (CYSTOCELE) AND POSTERIOR REPAIR (RECTOCELE);  Surgeon: Reece Packer, MD;  [facility]: [facility];  Service: Urology;;  with graft  . BREAST BIOPSY Right   . CARDIAC CATHETERIZATION  1999  . COLONOSCOPY    . CYSTOSCOPY WITH URETHRAL DILATATION  05/11/2012   Procedure: CYSTOSCOPY WITH URETHRAL DILATATION;  Surgeon: Reece Packer, MD;  [facility];  Service: Urology;;  . ESOPHAGOGASTRODUODENOSCOPY    . HERNIA REPAIR    . MASTECTOMY Right 2007  . PLANTAR FASCIA SURGERY Left   . THYROIDECTOMY N/A 02/23/2014   Procedure: TOTAL THYROIDECTOMY;  Surgeon: Armandina Gemma, MD;  [facility];  Service: General;  Laterality: N/A;  . TOTAL KNEE ARTHROPLASTY Right 07/02/2015  . TOTAL KNEE ARTHROPLASTY Right 07/02/2015   Procedure: TOTAL KNEE ARTHROPLASTY;  Surgeon: Elsie Saas, MD;  Location: [facility];  Service: Orthopedics;  Laterality: Right;  . TOTAL THYROIDECTOMY  1976   removed three tumor (2 on right; 1 on left)  . TUBAL LIGATION    . UMBILICAL HERNIA REPAIR  1986  . VAGINAL PROLAPSE REPAIR  05/11/2012   Procedure: VAGINAL VAULT SUSPENSION;  Surgeon: Reece Packer, MD;  [facility];  Service: Urology;;  . Clayborne Dana / PERINEUM BIOPSY     vulvar    There were no vitals filed for this visit.  Subjective Assessment - 04/02/17 0843    Subjective  Pt noting occasional soreness after she has been more active but no pain currently. Reports completing some exercises from HEP daily, alternating exercises between days.    Pertinent History  breast cancer, DM, HTN    Patient Stated Goals  be able to sit and drive with no pain, go to the movies, return to biking at the gym    Currently in Pain?  No/denies    Pain Score  0-No pain                      [facility] Adult PT Treatment/Exercise - 04/02/17 0841      Exercises   Exercises  Lumbar;Knee/Hip      Lumbar Exercises: Supine   Dead Bug  5 reps;3 seconds    Dead Bug Limitations  difficulty coordinating movements     Bridge  10 reps;5 seconds    Bridge Limitations  + hip ABD isometric with red TB    Straight Leg Raise  10 reps;5 seconds    Straight Leg Raises Limitations  knee extension from hooklying with eccentric lowering, with suspended heel slide to return to hooklying (alternating LE)    Other Supine Lumbar Exercises  abd bracing + alt hip flexion with red TB at feet x10      Lumbar Exercises: Sidelying   Clam  15 reps;3 seconds    Clam Limitations  red TB      Knee/Hip Exercises: Aerobic   Recumbent Bike  L2 x 6 min      Knee/Hip Exercises: Standing   Heel Raises  Both;15 reps;3 seconds    Heel Raises Limitations  UE support on counter    Forward Lunges  Right;Left;10 reps;3 seconds    Step Down  Right;Left;10 reps;Step Height: 4";Hand Hold: 2    Step Down Limitations  eccentric lowering with heel touch    Functional Squat  15 reps;3 seconds    Functional Squat Limitations  counter squat    Other Standing Knee Exercises  B sidestepping & monster walk fwd/bwd; 2 x 38ft each; red tband             PT Education - 04/02/17 0930    Education provided  Yes    Education Details  HEP update at pt request    Person(s) Educated  Patient    Methods  Explanation;Demonstration;Handout    Comprehension  Verbalized understanding;Returned demonstration;Need further instruction       PT Short Term Goals - 04/02/17 0845      PT SHORT TERM GOAL #1   Title  Patient to be independent with initial HEP    Status  Achieved        PT Long Term Goals - 03/19/17 1553      PT LONG TERM GOAL #1   Title  Patient to be independent with advanced HEP    Status  On-going    Target Date  04/28/17      PT LONG TERM GOAL #2   Title  Patient to increase B LE strength to >/=4+/5 to improve functional mobility    Status  On-going    Target Date  04/28/17      PT LONG TERM GOAL #3   Title  Patient to report ability to sit at home or  in car >/= 1 hr with pain no greater than 2/10    Status  On-going     Target Date  04/28/17      PT LONG TERM GOAL #4   Title  Patient to report ability to return to biking and walking at the gym with pain no greater than 2/10    Status  On-going    Target Date  04/28/17      PT LONG TERM GOAL #5   Title  Patient to report ability to sleep through the night without pain limiting    Status  On-going    Target Date  04/28/17            Plan - 04/02/17 0847    Clinical Impression Statement  Pt reporting no recent pain like that which brought her to PT, now only noitng mild muscle soreness at times associated with increased activity or exercises. She was unable to return to the gym this week due to inclement weather, but plans to do so next week. Continues tolerate strengthening exercise progression w/o c/o pain but did experience difficulty with cooridination of movement patterns with mat level lumbopelvic strengthening.    Rehab Potential  Good    PT Treatment/Interventions  ADLs/Self Care Home Management;Cryotherapy;Electrical Stimulation;Iontophoresis 4mg /ml Dexamethasone;Moist Heat;Traction;Ultrasound;Therapeutic exercise;Therapeutic activities;Functional mobility training;Neuromuscular re-education;Patient/family education;Passive range of motion;Manual techniques;Dry needling;Taping;Vasopneumatic Device    Consulted and Agree with Plan of Care  Patient       Patient will benefit from skilled therapeutic intervention in order to improve the following deficits and impairments:  Pain, Decreased activity tolerance, Decreased endurance, Decreased range of motion, Decreased strength, Decreased mobility  Visit Diagnosis: Acute right-sided low back pain with right-sided sciatica  Other symptoms and signs involving the musculoskeletal system  Muscle weakness (generalized)     Problem List Patient Active Problem List   Diagnosis Date Noted  . Right hip pain 03/17/2017  . Urinary frequency 03/10/2017  . Primary localized osteoarthritis of right  knee   . History of MRSA infection   . History of measles   . Dysrhythmia   . Chronic rhinitis 06/06/2015  . Shortness of breath 06/06/2015  . Vaginitis and vulvovaginitis 04/25/2015  . Sciatica of right side 04/25/2015  . Medicare annual wellness visit, initial 04/07/2015  . Allergic response 01/01/2015  . History of chickenpox 09/26/2014  . History of shingles   . 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 Deformity of metatarsal bone of left foot 03/03/2014  . Vulvar irritation  S/P neg vulvar bx 12/2013 01/23/2014  . 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 Right knee DJD 07/12/2013  . Neuralgia of left foot 06/24/2013  . Tendonitis of ankle, right 04/08/2013  . Vaginal dryness, menopausal 04/05/2013  . Pronation deformity of ankle, acquired 02/09/2013  . Cortical cataract 02/01/2013  . H/O bone density study 01/26/2013  . Chest pain 01/05/2013  . History of iritis 01/04/2013  . Dry eye syndrome 10/07/2012  . Meibomian gland disease 10/07/2012  . Gastroesophageal reflux disease without esophagitis 10/05/2012  . Essential hypertension 10/04/2012  . Hyperlipidemia, mixed 10/04/2012  . 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 Diabetic neuropathy ([place]) 10/04/2012  . S/P hysterectomy 10/04/2012  . Osteopenia 10/04/2012  . Hypothyroidism 10/04/2012  . History of cystocele 10/04/2012  . Dyspareunia in female 10/04/2012  . Plantar fasciitis 10/04/2012  . Nuclear sclerotic cataract 06/17/2012  . Diabetes mellitus ([place]) 03/02/2012  . Iritis 01/06/2012  . Neuropathy 01/06/2012  .  Disease of thyroid gland 01/06/2012  . Breast cancer ([place]) 05/06/2011    Percival Spanish, PT, MPT 04/02/2017, 9:31 AM  [facility] [address]  [place], [place], [address] Phone: [phone]   Fax:  [phone]  Name: Asli Tokarski MRN: [id number] Date of Birth: Nov 22, 1950

## 2017-04-07 ENCOUNTER — Ambulatory Visit: Payer: Medicare HMO | Admitting: Physical Therapy

## 2017-04-07 ENCOUNTER — Encounter: Payer: Medicare HMO | Admitting: Physical Therapy

## 2017-04-07 ENCOUNTER — Encounter: Payer: Self-pay | Admitting: Physical Therapy

## 2017-04-07 DIAGNOSIS — M5441 Lumbago with sciatica, right side: Secondary | ICD-10-CM | POA: Diagnosis not present

## 2017-04-07 DIAGNOSIS — R29898 Other symptoms and signs involving the musculoskeletal system: Secondary | ICD-10-CM | POA: Diagnosis not present

## 2017-04-07 DIAGNOSIS — M6281 Muscle weakness (generalized): Secondary | ICD-10-CM | POA: Diagnosis not present

## 2017-04-07 NOTE — Therapy (Signed)
[facility] [address]  [place], [place], [address] Phone: [phone]   Fax:  [phone]  Physical Therapy Treatment  Patient Details  Name: Victoria Walker MRN: [id number] Date of Birth: November 01, 1950 Referring Provider: Dr. Karlton Lemon   Encounter Date: 04/07/2017  PT End of Session - 04/07/17 0843    Visit Number  6    Number of Visits  12    Date for PT Re-Evaluation  04/28/17    Authorization Type  [facility]icare, [facility]    PT Start Time  [phone]    PT Stop Time  0922    PT Time Calculation (min)  41 min    Activity Tolerance  Patient tolerated treatment well    Behavior During Therapy  [facility] for tasks assessed/performed       Past Medical History:  Diagnosis Date  . Arthritis    "both knees" (07/02/2015)  . Cancer of right breast ([place])   . Claustrophobia   . Dysrhythmia    HX OF FAST HEART RATE AND PALPITATIONS - METOPROLOL HAS HELPED  . GERD (gastroesophageal reflux disease)   . H/O hiatal hernia   . H/O iritis    LEFT EYE - STATES HER EYE BECOMES RED AND VERY SENSITIVE TO LIGHT WHEN FLARE UP OF IRITIS  . Heart murmur    "benign; 06/2015"  . High cholesterol   . History of blood transfusion    "when I had my partial hysterectomy"  . History of chickenpox 09/26/2014  . History of measles   . History of MRSA infection   . History of shingles   . Hypertension   . Hypothyroidism   . Medicare annual wellness visit, initial 04/07/2015  . Multinodular thyroid    PT HAVING TROUBLE SWALLOWING  . Neuropathy    FEET  . Primary localized osteoarthritis of right knee   . Sciatica of right side 04/25/2015  . Spinal headache    spinal headache with epidural  . Type II diabetes mellitus (HCC)    ORAL MEDICATION - NO INSULIN  . Wears glasses     Past Surgical History:  Procedure Laterality Date  . ABDOMINAL HYSTERECTOMY  1988  . ABDOMINOPLASTY  1986  . ANTERIOR AND POSTERIOR REPAIR  05/11/2012   Procedure:  ANTERIOR (CYSTOCELE) AND POSTERIOR REPAIR (RECTOCELE);  Surgeon: Reece Packer, MD;  [facility];  Service: Urology;;  with graft  . BREAST BIOPSY Right   . CARDIAC CATHETERIZATION  1999  . COLONOSCOPY    . CYSTOSCOPY WITH URETHRAL DILATATION  05/11/2012   Procedure: CYSTOSCOPY WITH URETHRAL DILATATION;  Surgeon: Reece Packer, MD;  [facility];  Service: Urology;;  . ESOPHAGOGASTRODUODENOSCOPY    . HERNIA REPAIR    . MASTECTOMY Right 2007  . PLANTAR FASCIA SURGERY Left   . THYROIDECTOMY N/A 02/23/2014   Procedure: TOTAL THYROIDECTOMY;  Surgeon: Armandina Gemma, MD;  [facility];  Service: General;  Laterality: N/A;  . TOTAL KNEE ARTHROPLASTY Right 07/02/2015  . TOTAL KNEE ARTHROPLASTY Right 07/02/2015   Procedure: TOTAL KNEE ARTHROPLASTY;  Surgeon: Elsie Saas, MD;  Location: [facility];  Service: Orthopedics;  Laterality: Right;  . TOTAL THYROIDECTOMY  1976   removed three tumor (2 on right; 1 on left)  . TUBAL LIGATION    . UMBILICAL HERNIA REPAIR  1986  . VAGINAL PROLAPSE REPAIR  05/11/2012   Procedure: VAGINAL VAULT SUSPENSION;  Surgeon: Reece Packer, MD;  [facility];  Service: Urology;;  . Clayborne Dana / PERINEUM BIOPSY     vulvar    There were no vitals filed for this visit.  Subjective Assessment - 04/07/17 0842    Subjective  feels pretty good - went to the gym yesterday, rode bike for 30 minutes; went to a funeral - had some pain with prolonged sitting    Pertinent History  breast cancer, DM, HTN    Patient Stated Goals  be able to sit and drive with no pain, go to the movies, return to biking at the gym    Currently in Pain?  No/denies    Pain Score  0-No pain                      [facility] Adult PT Treatment/Exercise - 04/07/17 0844      Lumbar Exercises: Stretches   Passive Hamstring Stretch  3 reps;30 seconds    Passive Hamstring Stretch Limitations  B; supine with strap      Lumbar Exercises: Supine   Straight Leg Raise  15 reps;3  seconds    Straight Leg Raises Limitations  2#    Other Supine Lumbar Exercises  HS bridge on peanut ball x 15 reps      Lumbar Exercises: Sidelying   Hip Abduction  15 reps;Weights bilateral    Hip Abduction Weights (lbs)  2      Knee/Hip Exercises: Aerobic   Recumbent Bike  L2 x 6 min      Knee/Hip Exercises: Machines for Strengthening   Cybex Leg Press  B LE - 20# x 15 reps      Knee/Hip Exercises: Standing   Forward Lunges  Right;Left;10 reps;2 seconds    Forward Lunges Limitations  TRX    Functional Squat  15 reps - bilateal   Functional Squat Limitations  TRX      Knee/Hip Exercises: Supine   Bridges with Ball Squeeze  Both;10 reps               PT Short Term Goals - 04/02/17 0845      PT SHORT TERM GOAL #1   Title  Patient to be independent with initial HEP    Status  Achieved        PT Long Term Goals - 03/19/17 1553      PT LONG TERM GOAL #1   Title  Patient to be independent with advanced HEP    Status  On-going    Target Date  04/28/17      PT LONG TERM GOAL #2   Title  Patient to increase B LE strength to >/=4+/5 to improve functional mobility    Status  On-going    Target Date  04/28/17      PT LONG TERM GOAL #3   Title  Patient to report ability to sit at home or in car >/= 1 hr with pain no greater than 2/10    Status  On-going    Target Date  04/28/17      PT LONG TERM GOAL #4   Title  Patient to report ability to return to biking and walking at the gym with pain no greater than 2/10    Status  On-going    Target Date  04/28/17      PT LONG TERM GOAL #5   Title  Patient to report ability to sleep through the night without pain limiting    Status  On-going    Target Date  04/28/17            Plan - 04/07/17 0844    Clinical Impression Statement  Patient doing well today - reports ability to attend gym with no issue this week. Patient progressing all strengthening activities well both with machine work and with resistance  against gravity. Reports some pain with prolonged sitting, however, able to verablize ways to prevent and manage pain such as taking stand breaks and stretching. Making good progress towards goals.     PT Treatment/Interventions  ADLs/Self Care Home Management;Cryotherapy;Electrical Stimulation;Iontophoresis 4mg /ml Dexamethasone;Moist Heat;Traction;Ultrasound;Therapeutic exercise;Therapeutic activities;Functional mobility training;Neuromuscular re-education;Patient/family education;Passive range of motion;Manual techniques;Dry needling;Taping;Vasopneumatic Device    Consulted and Agree with Plan of Care  Patient       Patient will benefit from skilled therapeutic intervention in order to improve the following deficits and impairments:  Pain, Decreased activity tolerance, Decreased endurance, Decreased range of motion, Decreased strength, Decreased mobility  Visit Diagnosis: Acute right-sided low back pain with right-sided sciatica  Other symptoms and signs involving the musculoskeletal system  Muscle weakness (generalized)     Problem List Patient Active Problem List   Diagnosis Date Noted  . Right hip pain 03/17/2017  . Urinary frequency 03/10/2017  . Primary localized osteoarthritis of right knee   . History of MRSA infection   . History of measles   . Dysrhythmia   . Chronic rhinitis 06/06/2015  . Shortness of breath 06/06/2015  . Vaginitis and vulvovaginitis 04/25/2015  . Sciatica of right side 04/25/2015  . Medicare annual wellness visit, initial 04/07/2015  . Allergic response 01/01/2015  . History of chickenpox 09/26/2014  . History of shingles   . 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 Deformity of metatarsal bone of left foot 03/03/2014  . Vulvar irritation  S/P neg vulvar bx 12/2013 01/23/2014  . 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 Right knee DJD 07/12/2013  . Neuralgia of left foot 06/24/2013  . Tendonitis of  ankle, right 04/08/2013  . Vaginal dryness, menopausal 04/05/2013  . Pronation deformity of ankle, acquired 02/09/2013  . Cortical cataract 02/01/2013  . H/O bone density study 01/26/2013  . Chest pain 01/05/2013  . History of iritis 01/04/2013  . Dry eye syndrome 10/07/2012  . Meibomian gland disease 10/07/2012  . Gastroesophageal reflux disease without esophagitis 10/05/2012  . Essential hypertension 10/04/2012  . Hyperlipidemia, mixed 10/04/2012  . 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 Diabetic neuropathy ([place]) 10/04/2012  . S/P hysterectomy 10/04/2012  . Osteopenia 10/04/2012  . Hypothyroidism 10/04/2012  . History of cystocele 10/04/2012  . Dyspareunia in female 10/04/2012  . Plantar fasciitis 10/04/2012  . Nuclear sclerotic cataract 06/17/2012  . Diabetes mellitus ([place]) 03/02/2012  . Iritis 01/06/2012  . Neuropathy 01/06/2012  . Disease of thyroid gland 01/06/2012  . Breast cancer ([place]) 05/06/2011     Lanney Gins, PT, DPT 04/07/17 9:27 AM   [facility] [address]  [place], [place], [address] Phone: [phone]   Fax:  [phone]  Name: Victoria Walker MRN: [id number] Date of Birth: 17-Jan-1951

## 2017-04-09 ENCOUNTER — Encounter: Payer: Self-pay | Admitting: Physical Therapy

## 2017-04-09 ENCOUNTER — Encounter: Payer: Medicare HMO | Admitting: Physical Therapy

## 2017-04-09 ENCOUNTER — Ambulatory Visit: Payer: Medicare HMO | Admitting: Physical Therapy

## 2017-04-09 DIAGNOSIS — M6281 Muscle weakness (generalized): Secondary | ICD-10-CM

## 2017-04-09 DIAGNOSIS — M5441 Lumbago with sciatica, right side: Secondary | ICD-10-CM

## 2017-04-09 DIAGNOSIS — R29898 Other symptoms and signs involving the musculoskeletal system: Secondary | ICD-10-CM | POA: Diagnosis not present

## 2017-04-09 NOTE — Therapy (Signed)
[facility] [address]  [place], [place], [address] Phone: [phone]   Fax:  [phone]  Physical Therapy Treatment  Patient Details  Name: Victoria Walker MRN: [id number] Date of Birth: 1951-01-13 Referring Provider: Dr. Karlton Lemon   Encounter Date: 04/09/2017  PT End of Session - 04/09/17 0845    Visit Number  7    Number of Visits  12    Date for PT Re-Evaluation  04/28/17    Authorization Type  Medicare, [place] [facility]    PT Start Time  [id number]    PT Stop Time  0930    PT Time Calculation (min)  45 min    Activity Tolerance  Patient tolerated treatment well    Behavior During Therapy  [facility] for tasks assessed/performed       Past Medical History:  Diagnosis Date  . Arthritis    "both knees" (07/02/2015)  . Cancer of right breast ([place])   . Claustrophobia   . Dysrhythmia    HX OF FAST HEART RATE AND PALPITATIONS - METOPROLOL HAS HELPED  . GERD (gastroesophageal reflux disease)   . H/O hiatal hernia   . H/O iritis    LEFT EYE - STATES HER EYE BECOMES RED AND VERY SENSITIVE TO LIGHT WHEN FLARE UP OF IRITIS  . Heart murmur    "benign; 06/2015"  . High cholesterol   . History of blood transfusion    "when I had my partial hysterectomy"  . History of chickenpox 09/26/2014  . History of measles   . History of MRSA infection   . History of shingles   . Hypertension   . Hypothyroidism   . Medicare annual wellness visit, initial 04/07/2015  . Multinodular thyroid    PT HAVING TROUBLE SWALLOWING  . Neuropathy    FEET  . Primary localized osteoarthritis of right knee   . Sciatica of right side 04/25/2015  . Spinal headache    spinal headache with epidural  . Type II diabetes mellitus (HCC)    ORAL MEDICATION - NO INSULIN  . Wears glasses     Past Surgical History:  Procedure Laterality Date  . ABDOMINAL HYSTERECTOMY  1988  . ABDOMINOPLASTY  1986  . ANTERIOR AND POSTERIOR REPAIR  05/11/2012   Procedure:  ANTERIOR (CYSTOCELE) AND POSTERIOR REPAIR (RECTOCELE);  Surgeon: Reece Packer, MD;  Location: [facility];  Service: Urology;;  with graft  . BREAST BIOPSY Right   . CARDIAC CATHETERIZATION  1999  . COLONOSCOPY    . CYSTOSCOPY WITH URETHRAL DILATATION  05/11/2012   Procedure: CYSTOSCOPY WITH URETHRAL DILATATION;  Surgeon: Reece Packer, MD;  Location: [facility];  Service: Urology;;  . ESOPHAGOGASTRODUODENOSCOPY    . HERNIA REPAIR    . MASTECTOMY Right 2007  . PLANTAR FASCIA SURGERY Left   . THYROIDECTOMY N/A 02/23/2014   Procedure: TOTAL THYROIDECTOMY;  Surgeon: Armandina Gemma, MD;  Location: [facility];  Service: General;  Laterality: N/A;  . TOTAL KNEE ARTHROPLASTY Right 07/02/2015  . TOTAL KNEE ARTHROPLASTY Right 07/02/2015   Procedure: TOTAL KNEE ARTHROPLASTY;  Surgeon: Elsie Saas, MD;  Location: [facility];  Service: Orthopedics;  Laterality: Right;  . TOTAL THYROIDECTOMY  1976   removed three tumor (2 on right; 1 on left)  . TUBAL LIGATION    . UMBILICAL HERNIA REPAIR  1986  . VAGINAL PROLAPSE REPAIR  05/11/2012   Procedure: VAGINAL VAULT SUSPENSION;  Surgeon: Reece Packer, MD;  Location: [facility];  Service: Urology;;  . Clayborne Dana / PERINEUM BIOPSY     vulvar    There were no vitals filed for this visit.  Subjective Assessment - 04/09/17 0848    Subjective  Pt doing well. Has been trying to go to gym on days not in therapy, but missed yesterday due to another appt.    Pertinent History  breast cancer, DM, HTN    Patient Stated Goals  be able to sit and drive with no pain, go to the movies, return to biking at the gym    Currently in Pain?  No/denies    Pain Score  0-No pain                      [facility] Adult PT Treatment/Exercise - 04/09/17 0845      Lumbar Exercises: Supine   Other Supine Lumbar Exercises  bridge + HS curl on peanut ball x10      Knee/Hip Exercises: Aerobic   Recumbent Bike  L2 x 6 min      Knee/Hip Exercises: Standing   Hip Flexion  Right;Left;10  reps;Stengthening    Hip Flexion Limitations  red TB, opp LE SLS on blue foam oval; 2 pole A    Hip ADduction  Right;Left;10 reps;Strengthening    Hip ADduction Limitations  red TB, opp LE SLS on blue foam oval; 2 pole A    Hip Abduction  Right;Left;10 reps;Stengthening    Abduction Limitations  red TB, opp LE SLS on blue foam oval; 2 pole A    Hip Extension  Right;Left;10 reps;Stengthening    Extension Limitations  red TB, opp LE SLS on blue foam oval; 2 pole A    Wall Squat  15 reps;3 seconds    Other Standing Knee Exercises  B fitter hip abduction & extension (2 blue) x10 each      Knee/Hip Exercises: Supine   Bridges with Clamshell  Both;15 reps alt hip ABD/ER with red TB               PT Short Term Goals - 04/02/17 0845      PT SHORT TERM GOAL #1   Title  Patient to be independent with initial HEP    Status  Achieved        PT Long Term Goals - 03/19/17 1553      PT LONG TERM GOAL #1   Title  Patient to be independent with advanced HEP    Status  On-going    Target Date  04/28/17      PT LONG TERM GOAL #2   Title  Patient to increase B LE strength to >/=4+/5 to improve functional mobility    Status  On-going    Target Date  04/28/17      PT LONG TERM GOAL #3   Title  Patient to report ability to sit at home or in car >/= 1 hr with pain no greater than 2/10    Status  On-going    Target Date  04/28/17      PT LONG TERM GOAL #4   Title  Patient to report ability to return to biking and walking at the gym with pain no greater than 2/10    Status  On-going    Target Date  04/28/17      PT LONG TERM GOAL #5   Title  Patient to report ability to sleep through the night without pain limiting    Status  On-going  Target Date  04/28/17            Plan - 04/09/17 0853    Clinical Impression Statement  Pt w/o concerns or complaints today. Tolerating increasing complexity of exercises including incorporation of unstable surfaces to challenge balance and  increase muscle activation with moderate cueing to understand desired activity, but then able to perform exercises correctly without issues.     Rehab Potential  Good    PT Treatment/Interventions  ADLs/Self Care Home Management;Cryotherapy;Electrical Stimulation;Iontophoresis 4mg /ml Dexamethasone;Moist Heat;Traction;Ultrasound;Therapeutic exercise;Therapeutic activities;Functional mobility training;Neuromuscular re-education;Patient/family education;Passive range of motion;Manual techniques;Dry needling;Taping;Vasopneumatic Device    Consulted and Agree with Plan of Care  Patient       Patient will benefit from skilled therapeutic intervention in order to improve the following deficits and impairments:  Pain, Decreased activity tolerance, Decreased endurance, Decreased range of motion, Decreased strength, Decreased mobility  Visit Diagnosis: Acute right-sided low back pain with right-sided sciatica  Other symptoms and signs involving the musculoskeletal system  Muscle weakness (generalized)     Problem List Patient Active Problem List   Diagnosis Date Noted  . Right hip pain 03/17/2017  . Urinary frequency 03/10/2017  . Primary localized osteoarthritis of right knee   . History of MRSA infection   . History of measles   . Dysrhythmia   . Chronic rhinitis 06/06/2015  . Shortness of breath 06/06/2015  . Vaginitis and vulvovaginitis 04/25/2015  . Sciatica of right side 04/25/2015  . Medicare annual wellness visit, initial 04/07/2015  . Allergic response 01/01/2015  . History of chickenpox 09/26/2014  . History of shingles   . 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 Deformity of metatarsal bone of left foot 03/03/2014  . Vulvar irritation  S/P neg vulvar bx 12/2013 01/23/2014  . 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 Right knee DJD 07/12/2013  . Neuralgia of left foot 06/24/2013  . Tendonitis of ankle, right 04/08/2013  .  Vaginal dryness, menopausal 04/05/2013  . Pronation deformity of ankle, acquired 02/09/2013  . Cortical cataract 02/01/2013  . H/O bone density study 01/26/2013  . Chest pain 01/05/2013  . History of iritis 01/04/2013  . Dry eye syndrome 10/07/2012  . Meibomian gland disease 10/07/2012  . Gastroesophageal reflux disease without esophagitis 10/05/2012  . Essential hypertension 10/04/2012  . Hyperlipidemia, mixed 10/04/2012  . 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 Diabetic neuropathy ([place]) 10/04/2012  . S/P hysterectomy 10/04/2012  . Osteopenia 10/04/2012  . Hypothyroidism 10/04/2012  . History of cystocele 10/04/2012  . Dyspareunia in female 10/04/2012  . Plantar fasciitis 10/04/2012  . Nuclear sclerotic cataract 06/17/2012  . Diabetes mellitus ([place]) 03/02/2012  . Iritis 01/06/2012  . Neuropathy 01/06/2012  . Disease of thyroid gland 01/06/2012  . Breast cancer ([place]) 05/06/2011    Percival Spanish, PT, MPT 04/09/2017, 9:33 AM  [facility] [address]  [place], [place], [address] Phone: [phone]   Fax:  [phone]  Name: Victoria Walker MRN: [id number] Date of Birth: 1951/01/02

## 2017-04-16 ENCOUNTER — Other Ambulatory Visit: Payer: Medicare HMO

## 2017-04-16 ENCOUNTER — Ambulatory Visit: Payer: Medicare HMO | Admitting: Physical Therapy

## 2017-04-16 ENCOUNTER — Other Ambulatory Visit (HOSPITAL_COMMUNITY)
Admission: RE | Admit: 2017-04-16 | Discharge: 2017-04-16 | Disposition: A | Discharge status: Home / Self Care | Payer: Medicare HMO | Source: Ambulatory Visit | Attending: Obstetrics & Gynecology | Admitting: Obstetrics & Gynecology

## 2017-04-16 ENCOUNTER — Encounter: Payer: Self-pay | Admitting: Physical Therapy

## 2017-04-16 DIAGNOSIS — N898 Other specified noninflammatory disorders of vagina: Secondary | ICD-10-CM

## 2017-04-16 DIAGNOSIS — R29898 Other symptoms and signs involving the musculoskeletal system: Secondary | ICD-10-CM | POA: Diagnosis not present

## 2017-04-16 DIAGNOSIS — M5441 Lumbago with sciatica, right side: Secondary | ICD-10-CM | POA: Diagnosis not present

## 2017-04-16 DIAGNOSIS — M6281 Muscle weakness (generalized): Secondary | ICD-10-CM | POA: Diagnosis not present

## 2017-04-16 MED ORDER — FLUCONAZOLE 150 MG PO TABS: 150.0000 mg | ORAL_TABLET | Freq: Once | ORAL | 1 refills | Status: AC

## 2017-04-16 NOTE — Therapy (Signed)
[facility] [address]  [place], [place], [address] Phone: [phone]   Fax:  [phone]  Physical Therapy Treatment  Patient Details  Name: Victoria Walker MRN: [id number] Date of Birth: 08-05-1950 Referring Provider: Dr. Karlton Lemon   Encounter Date: 04/16/2017  PT End of Session - 04/16/17 1445    Visit Number  8    Number of Visits  12    Date for PT Re-Evaluation  04/28/17    Authorization Type  [place]icare, [place] [facility]    PT Start Time  8850    PT Stop Time  1524    PT Time Calculation (min)  40 min    Activity Tolerance  Patient tolerated treatment well    Behavior During Therapy  [facility] for tasks assessed/performed       Past Medical History:  Diagnosis Date  . Arthritis    "both knees" (07/02/2015)  . Cancer of right breast ([place])   . Claustrophobia   . Dysrhythmia    HX OF FAST HEART RATE AND PALPITATIONS - METOPROLOL HAS HELPED  . GERD (gastroesophageal reflux disease)   . H/O hiatal hernia   . H/O iritis    LEFT EYE - STATES HER EYE BECOMES RED AND VERY SENSITIVE TO LIGHT WHEN FLARE UP OF IRITIS  . Heart murmur    "benign; 06/2015"  . High cholesterol   . History of blood transfusion    "when I had my partial hysterectomy"  . History of chickenpox 09/26/2014  . History of measles   . History of MRSA infection   . History of shingles   . Hypertension   . Hypothyroidism   . Medicare annual wellness visit, initial 04/07/2015  . Multinodular thyroid    PT HAVING TROUBLE SWALLOWING  . Neuropathy    FEET  . Primary localized osteoarthritis of right knee   . Sciatica of right side 04/25/2015  . Spinal headache    spinal headache with epidural  . Type II diabetes mellitus (HCC)    ORAL MEDICATION - NO INSULIN  . Wears glasses     Past Surgical History:  Procedure Laterality Date  . ABDOMINAL HYSTERECTOMY  1988  . ABDOMINOPLASTY  1986  . ANTERIOR AND POSTERIOR REPAIR  05/11/2012   Procedure:  ANTERIOR (CYSTOCELE) AND POSTERIOR REPAIR (RECTOCELE);  Surgeon: Reece Packer, MD;  [facility];  Service: Urology;;  with graft  . BREAST BIOPSY Right   . CARDIAC CATHETERIZATION  1999  . COLONOSCOPY    . CYSTOSCOPY WITH URETHRAL DILATATION  05/11/2012   Procedure: CYSTOSCOPY WITH URETHRAL DILATATION;  Surgeon: Reece Packer, MD;  [facility];  Service: Urology;;  . ESOPHAGOGASTRODUODENOSCOPY    . HERNIA REPAIR    . MASTECTOMY Right 2007  . PLANTAR FASCIA SURGERY Left   . THYROIDECTOMY N/A 02/23/2014   Procedure: TOTAL THYROIDECTOMY;  Surgeon: Armandina Gemma, MD;  [facility];  Service: General;  Laterality: N/A;  . TOTAL KNEE ARTHROPLASTY Right 07/02/2015  . TOTAL KNEE ARTHROPLASTY Right 07/02/2015   Procedure: TOTAL KNEE ARTHROPLASTY;  Surgeon: Elsie Saas, MD;  Location: [facility];  Service: Orthopedics;  Laterality: Right;  . TOTAL THYROIDECTOMY  1976   removed three tumor (2 on right; 1 on left)  . TUBAL LIGATION    . UMBILICAL HERNIA REPAIR  1986  . VAGINAL PROLAPSE REPAIR  05/11/2012   Procedure: VAGINAL VAULT SUSPENSION;  Surgeon: Reece Packer, MD;  [facility];  Service: Urology;;  . Clayborne Dana / PERINEUM BIOPSY     vulvar    There were no vitals filed for this visit.  Subjective Assessment - 04/16/17 1444    Subjective  feeling well - not been to the gym    Pertinent History  breast cancer, DM, HTN    Patient Stated Goals  be able to sit and drive with no pain, go to the movies, return to biking at the gym    Currently in Pain?  No/denies    Pain Score  0-No pain                      [facility] Adult PT Treatment/Exercise - 04/16/17 1447      Lumbar Exercises: Stretches   Single Knee to Chest Stretch  2 reps;20 seconds bilateral    Piriformis Stretch  2 reps;30 seconds    Piriformis Stretch Limitations  B - supine figure 4       Lumbar Exercises: Supine   Clam  10 reps;3 seconds + ab bracing - green tband    Bent Knee Raise  10  reps;5 seconds    Bent Knee Raise Limitations  brace marching with green tband TB    Bridge  15 reps;5 seconds    Bridge Limitations  + hip ABD isometric with green TB      Knee/Hip Exercises: Aerobic   Recumbent Bike  L2 x 6 min      Knee/Hip Exercises: Standing   Functional Squat  15 reps    Functional Squat Limitations  TRX    Wall Squat  15 reps;3 seconds      Modalities   Modalities  Iontophoresis      Iontophoresis   Type of Iontophoresis  Dexamethasone    Location  R hip    Dose  1.0 mL    Time  80 mA - 4-6 hours               PT Short Term Goals - 04/02/17 0845      PT SHORT TERM GOAL #1   Title  Patient to be independent with initial HEP    Status  Achieved        PT Long Term Goals - 03/19/17 1553      PT LONG TERM GOAL #1   Title  Patient to be independent with advanced HEP    Status  On-going    Target Date  04/28/17      PT LONG TERM GOAL #2   Title  Patient to increase B LE strength to >/=4+/5 to improve functional mobility    Status  On-going    Target Date  04/28/17      PT LONG TERM GOAL #3   Title  Patient to report ability to sit at home or in car >/= 1 hr with pain no greater than 2/10    Status  On-going    Target Date  04/28/17      PT LONG TERM GOAL #4   Title  Patient to report ability to return to biking and walking at the gym with pain no greater than 2/10    Status  On-going    Target Date  04/28/17      PT LONG TERM GOAL #5   Title  Patient to report ability to sleep through the night without pain limiting    Status  On-going    Target Date  04/28/17  Plan - 04/16/17 1446    Clinical Impression Statement  Patient doing ewll today - does report a slight increase in R hip pain the past few days, of which she attributes to prolonged standing and little rest. Patient tolerable to all stretching and strengtheing today with seated piriformis stretch given as updated HEP today with good carryover. Will continue  to progress towards goals.     PT Treatment/Interventions  ADLs/Self Care Home Management;Cryotherapy;Electrical Stimulation;Iontophoresis 4mg /ml Dexamethasone;Moist Heat;Traction;Ultrasound;Therapeutic exercise;Therapeutic activities;Functional mobility training;Neuromuscular re-education;Patient/family education;Passive range of motion;Manual techniques;Dry needling;Taping;Vasopneumatic Device    Consulted and Agree with Plan of Care  Patient       Patient will benefit from skilled therapeutic intervention in order to improve the following deficits and impairments:  Pain, Decreased activity tolerance, Decreased endurance, Decreased range of motion, Decreased strength, Decreased mobility  Visit Diagnosis: Acute right-sided low back pain with right-sided sciatica  Other symptoms and signs involving the musculoskeletal system  Muscle weakness (generalized)     Problem List Patient Active Problem List   Diagnosis Date Noted  . Right hip pain 03/17/2017  . Urinary frequency 03/10/2017  . Primary localized osteoarthritis of right knee   . History of MRSA infection   . History of measles   . Dysrhythmia   . Chronic rhinitis 06/06/2015  . Shortness of breath 06/06/2015  . Vaginitis and vulvovaginitis 04/25/2015  . Sciatica of right side 04/25/2015  . Medicare annual wellness visit, initial 04/07/2015  . Allergic response 01/01/2015  . History of chickenpox 09/26/2014  . History of shingles   . 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 Deformity of metatarsal bone of left foot 03/03/2014  . Vulvar irritation  S/P neg vulvar bx 12/2013 01/23/2014  . 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 Right knee DJD 07/12/2013  . Neuralgia of left foot 06/24/2013  . Tendonitis of ankle, right 04/08/2013  . Vaginal dryness, menopausal 04/05/2013  . Pronation deformity of ankle, acquired 02/09/2013  . Cortical cataract 02/01/2013  . H/O bone  density study 01/26/2013  . Chest pain 01/05/2013  . History of iritis 01/04/2013  . Dry eye syndrome 10/07/2012  . Meibomian gland disease 10/07/2012  . Gastroesophageal reflux disease without esophagitis 10/05/2012  . Essential hypertension 10/04/2012  . Hyperlipidemia, mixed 10/04/2012  . 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 Diabetic neuropathy ([place]) 10/04/2012  . S/P hysterectomy 10/04/2012  . Osteopenia 10/04/2012  . Hypothyroidism 10/04/2012  . History of cystocele 10/04/2012  . Dyspareunia in female 10/04/2012  . Plantar fasciitis 10/04/2012  . Nuclear sclerotic cataract 06/17/2012  . Diabetes mellitus ([place]) 03/02/2012  . Iritis 01/06/2012  . Neuropathy 01/06/2012  . Disease of thyroid gland 01/06/2012  . Breast cancer ([place]) 05/06/2011     Lanney Gins, PT, DPT 04/16/17 3:29 PM   [facility] [address]  [place], [place], [address] Phone: [phone]   Fax:  [phone]  Name: Guerline Happ MRN: [id number] Date of Birth: 13-Jul-1950

## 2017-04-16 NOTE — Patient Instructions (Signed)
Piriformis Stretch, Sitting    Sit, one ankle on opposite knee, same-side hand on crossed knee. Push down on knee, keeping spine straight. Lean torso forward, with flat back, until tension is felt in hamstrings and gluteals of crossed-leg side. Hold _20-30__ seconds.  Repeat _3__ times per session.

## 2017-04-16 NOTE — Progress Notes (Signed)
Patient presents with vaginal itching for last two days. Patient states she has a small white discharge. Patient states that she feels like when she eats more sweets than normal she has this problem. Patient requesting medication for yeast infection.  Patient given Aptima swab and preform self swab on her own.  Scheduled patient for well woman exam. Kathrene Alu RN

## 2017-04-17 ENCOUNTER — Telehealth: Payer: Self-pay | Admitting: *Deleted

## 2017-04-17 DIAGNOSIS — Z9011 Acquired absence of right breast and nipple: Secondary | ICD-10-CM | POA: Diagnosis not present

## 2017-04-17 NOTE — Telephone Encounter (Signed)
Received Physician Orders from [place]; forwarded to provider/SLS 12/28

## 2017-04-18 ENCOUNTER — Other Ambulatory Visit: Payer: Self-pay | Admitting: Family Medicine

## 2017-04-20 ENCOUNTER — Encounter: Payer: Self-pay | Admitting: Physical Therapy

## 2017-04-20 ENCOUNTER — Ambulatory Visit: Payer: Medicare HMO | Admitting: Physical Therapy

## 2017-04-20 DIAGNOSIS — R29898 Other symptoms and signs involving the musculoskeletal system: Secondary | ICD-10-CM

## 2017-04-20 DIAGNOSIS — M5441 Lumbago with sciatica, right side: Secondary | ICD-10-CM | POA: Diagnosis not present

## 2017-04-20 DIAGNOSIS — M6281 Muscle weakness (generalized): Secondary | ICD-10-CM

## 2017-04-20 LAB — CERVICOVAGINAL ANCILLARY ONLY
Bacterial vaginitis: NEGATIVE
Candida vaginitis: NEGATIVE

## 2017-04-20 NOTE — Therapy (Signed)
[facility] [address]  [place], [place], [address] Phone: [phone]   Fax:  [phone]  Physical Therapy Treatment  Patient Details  Name: Victoria Walker MRN: [id number] Date of Birth: 04-21-1951 Referring Provider: Dr. Karlton Lemon   Encounter Date: 04/20/2017  PT End of Session - 04/20/17 1610    Visit Number  9    Number of Visits  12    Date for PT Re-Evaluation  04/28/17    Authorization Type  Medicare, [facility]    PT Start Time  [phone]    PT Stop Time  1649    PT Time Calculation (min)  42 min    Activity Tolerance  Patient tolerated treatment well    Behavior During Therapy  [facility] for tasks assessed/performed       Past Medical History:  Diagnosis Date  . Arthritis    "both knees" (07/02/2015)  . Cancer of right breast ([place])   . Claustrophobia   . Dysrhythmia    HX OF FAST HEART RATE AND PALPITATIONS - METOPROLOL HAS HELPED  . GERD (gastroesophageal reflux disease)   . H/O hiatal hernia   . H/O iritis    LEFT EYE - STATES HER EYE BECOMES RED AND VERY SENSITIVE TO LIGHT WHEN FLARE UP OF IRITIS  . Heart murmur    "benign; 06/2015"  . High cholesterol   . History of blood transfusion    "when I had my partial hysterectomy"  . History of chickenpox 09/26/2014  . History of measles   . History of MRSA infection   . History of shingles   . Hypertension   . Hypothyroidism   . Medicare annual wellness visit, initial 04/07/2015  . Multinodular thyroid    PT HAVING TROUBLE SWALLOWING  . Neuropathy    FEET  . Primary localized osteoarthritis of right knee   . Sciatica of right side 04/25/2015  . Spinal headache    spinal headache with epidural  . Type II diabetes mellitus (HCC)    ORAL MEDICATION - NO INSULIN  . Wears glasses     Past Surgical History:  Procedure Laterality Date  . ABDOMINAL HYSTERECTOMY  1988  . ABDOMINOPLASTY  1986  . ANTERIOR AND POSTERIOR REPAIR  05/11/2012   Procedure:  ANTERIOR (CYSTOCELE) AND POSTERIOR REPAIR (RECTOCELE);  Surgeon: Reece Packer, MD;  [facility]: [facility];  Service: Urology;;  with graft  . BREAST BIOPSY Right   . CARDIAC CATHETERIZATION  1999  . COLONOSCOPY    . CYSTOSCOPY WITH URETHRAL DILATATION  05/11/2012   Procedure: CYSTOSCOPY WITH URETHRAL DILATATION;  Surgeon: Reece Packer, MD;  [facility];  Service: Urology;;  . ESOPHAGOGASTRODUODENOSCOPY    . HERNIA REPAIR    . MASTECTOMY Right 2007  . PLANTAR FASCIA SURGERY Left   . THYROIDECTOMY N/A 02/23/2014   Procedure: TOTAL THYROIDECTOMY;  Surgeon: Armandina Gemma, MD;  [facility];  Service: General;  Laterality: N/A;  . TOTAL KNEE ARTHROPLASTY Right 07/02/2015  . TOTAL KNEE ARTHROPLASTY Right 07/02/2015   Procedure: TOTAL KNEE ARTHROPLASTY;  Surgeon: Elsie Saas, MD;  Location: [facility];  Service: Orthopedics;  Laterality: Right;  . TOTAL THYROIDECTOMY  1976   removed three tumor (2 on right; 1 on left)  . TUBAL LIGATION    . UMBILICAL HERNIA REPAIR  1986  . VAGINAL PROLAPSE REPAIR  05/11/2012   Procedure: VAGINAL VAULT SUSPENSION;  Surgeon: Reece Packer, MD;  [facility];  Service: Urology;;  . Clayborne Dana / PERINEUM BIOPSY     vulvar    There were no vitals filed for this visit.  Subjective Assessment - 04/20/17 1608    Subjective  doing well - no new complaints    Pertinent History  breast cancer, DM, HTN    Patient Stated Goals  be able to sit and drive with no pain, go to the movies, return to biking at the gym    Currently in Pain?  No/denies    Pain Score  0-No pain                      [facility] Adult PT Treatment/Exercise - 04/20/17 1611      Lumbar Exercises: Supine   Other Supine Lumbar Exercises  HS bridge x 15 reps; HS bridge + HS curl x 10 reps      Lumbar Exercises: Quadruped   Other Quadruped Lumbar Exercises  B hip extension - target LE floor to extension x 12 each side      Knee/Hip Exercises: Aerobic   Recumbent Bike  L2 x 6  min      Knee/Hip Exercises: Machines for Strengthening   Cybex Knee Extension  B LE - 25# x 15 reps    Cybex Knee Flexion  B LE - 25# x 15    Cybex Leg Press  B LE - 25# x 20 reps      Knee/Hip Exercises: Standing   Forward Lunges  Right;Left;15 reps    Forward Lunges Limitations  TRX    Functional Squat  15 reps    Functional Squat Limitations  TRX    Wall Squat  15 reps;3 seconds               PT Short Term Goals - 04/02/17 0845      PT SHORT TERM GOAL #1   Title  Patient to be independent with initial HEP    Status  Achieved        PT Long Term Goals - 03/19/17 1553      PT LONG TERM GOAL #1   Title  Patient to be independent with advanced HEP    Status  On-going    Target Date  04/28/17      PT LONG TERM GOAL #2   Title  Patient to increase B LE strength to >/=4+/5 to improve functional mobility    Status  On-going    Target Date  04/28/17      PT LONG TERM GOAL #3   Title  Patient to report ability to sit at home or in car >/= 1 hr with pain no greater than 2/10    Status  On-going    Target Date  04/28/17      PT LONG TERM GOAL #4   Title  Patient to report ability to return to biking and walking at the gym with pain no greater than 2/10    Status  On-going    Target Date  04/28/17      PT LONG TERM GOAL #5   Title  Patient to report ability to sleep through the night without pain limiting    Status  On-going    Target Date  04/28/17            Plan - 04/20/17 1610    Clinical Impression Statement  Victoria Walker doing well today - all hip pain from last session now cleared up. Does report benefit from  ionto patch at last visit. Patient doing well today with all strengthening and various positions. Patient reports trying to re-initiate gym program this week. Will reasses goals at next visit.     PT Treatment/Interventions  ADLs/Self Care Home Management;Cryotherapy;Electrical Stimulation;Iontophoresis 4mg /ml Dexamethasone;Moist  Heat;Traction;Ultrasound;Therapeutic exercise;Therapeutic activities;Functional mobility training;Neuromuscular re-education;Patient/family education;Passive range of motion;Manual techniques;Dry needling;Taping;Vasopneumatic Device    Consulted and Agree with Plan of Care  Patient       Patient will benefit from skilled therapeutic intervention in order to improve the following deficits and impairments:  Pain, Decreased activity tolerance, Decreased endurance, Decreased range of motion, Decreased strength, Decreased mobility  Visit Diagnosis: Acute right-sided low back pain with right-sided sciatica  Other symptoms and signs involving the musculoskeletal system  Muscle weakness (generalized)     Problem List Patient Active Problem List   Diagnosis Date Noted  . Right hip pain 03/17/2017  . Urinary frequency 03/10/2017  . Primary localized osteoarthritis of right knee   . History of MRSA infection   . History of measles   . Dysrhythmia   . Chronic rhinitis 06/06/2015  . Shortness of breath 06/06/2015  . Vaginitis and vulvovaginitis 04/25/2015  . Sciatica of right side 04/25/2015  . Medicare annual wellness visit, initial 04/07/2015  . Allergic response 01/01/2015  . History of chickenpox 09/26/2014  . History of shingles   . 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 Deformity of metatarsal bone of left foot 03/03/2014  . Vulvar irritation  S/P neg vulvar bx 12/2013 01/23/2014  . 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 Right knee DJD 07/12/2013  . Neuralgia of left foot 06/24/2013  . Tendonitis of ankle, right 04/08/2013  . Vaginal dryness, menopausal 04/05/2013  . Pronation deformity of ankle, acquired 02/09/2013  . Cortical cataract 02/01/2013  . H/O bone density study 01/26/2013  . Chest pain 01/05/2013  . History of iritis 01/04/2013  . Dry eye syndrome 10/07/2012  . Meibomian gland disease 10/07/2012  .  Gastroesophageal reflux disease without esophagitis 10/05/2012  . Essential hypertension 10/04/2012  . Hyperlipidemia, mixed 10/04/2012  . 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 Diabetic neuropathy ([place]) 10/04/2012  . S/P hysterectomy 10/04/2012  . Osteopenia 10/04/2012  . Hypothyroidism 10/04/2012  . History of cystocele 10/04/2012  . Dyspareunia in female 10/04/2012  . Plantar fasciitis 10/04/2012  . Nuclear sclerotic cataract 06/17/2012  . Diabetes mellitus ([place]) 03/02/2012  . Iritis 01/06/2012  . Neuropathy 01/06/2012  . Disease of thyroid gland 01/06/2012  . Breast cancer ([place]) 05/06/2011     Lanney Gins, PT, DPT 04/20/17 4:52 PM   [facility] [address]  [place], [place], [address] Phone: [phone]   Fax:  [phone]  Name: Victoria Walker MRN: [id number] Date of Birth: October 31, 1950

## 2017-04-22 ENCOUNTER — Telehealth: Payer: Self-pay

## 2017-04-22 NOTE — Telephone Encounter (Signed)
Spoke with pt and she is aware of negative BV & yeast infection results

## 2017-04-23 ENCOUNTER — Other Ambulatory Visit: Payer: Self-pay | Admitting: Family Medicine

## 2017-04-23 ENCOUNTER — Ambulatory Visit: Payer: Medicare Other | Attending: Family Medicine | Admitting: Physical Therapy

## 2017-04-23 ENCOUNTER — Encounter: Payer: Self-pay | Admitting: Physical Therapy

## 2017-04-23 DIAGNOSIS — M6281 Muscle weakness (generalized): Secondary | ICD-10-CM | POA: Diagnosis not present

## 2017-04-23 DIAGNOSIS — R29898 Other symptoms and signs involving the musculoskeletal system: Secondary | ICD-10-CM

## 2017-04-23 DIAGNOSIS — M5441 Lumbago with sciatica, right side: Secondary | ICD-10-CM

## 2017-04-23 NOTE — Therapy (Addendum)
[facility] [address]  [place], [place], [address] Phone: [phone]   Fax:  [phone]  Physical Therapy Treatment  Patient Details  Name: Victoria Walker MRN: [id number] Date of Birth: 07-Jan-1951 Referring Provider: Dr. Karlton Lemon   Encounter Date: 04/23/2017  PT End of Session - 04/23/17 1441    Visit Number  10    Number of Visits  12    Date for PT Re-Evaluation  04/28/17    Authorization Type  Medicare, [facility]    PT Start Time  1440    PT Stop Time  1520    PT Time Calculation (min)  40 min    Activity Tolerance  Patient tolerated treatment well    Behavior During Therapy  [facility] for tasks assessed/performed       Past Medical History:  Diagnosis Date  . Arthritis    "both knees" (07/02/2015)  . Cancer of right breast ([place])   . Claustrophobia   . Dysrhythmia    HX OF FAST HEART RATE AND PALPITATIONS - METOPROLOL HAS HELPED  . GERD (gastroesophageal reflux disease)   . H/O hiatal hernia   . H/O iritis    LEFT EYE - STATES HER EYE BECOMES RED AND VERY SENSITIVE TO LIGHT WHEN FLARE UP OF IRITIS  . Heart murmur    "benign; 06/2015"  . High cholesterol   . History of blood transfusion    "when I had my partial hysterectomy"  . History of chickenpox 09/26/2014  . History of measles   . History of MRSA infection   . History of shingles   . Hypertension   . Hypothyroidism   . Medicare annual wellness visit, initial 04/07/2015  . Multinodular thyroid    PT HAVING TROUBLE SWALLOWING  . Neuropathy    FEET  . Primary localized osteoarthritis of right knee   . Sciatica of right side 04/25/2015  . Spinal headache    spinal headache with epidural  . Type II diabetes mellitus (HCC)    ORAL MEDICATION - NO INSULIN  . Wears glasses     Past Surgical History:  Procedure Laterality Date  . ABDOMINAL HYSTERECTOMY  1988  . ABDOMINOPLASTY  1986  . ANTERIOR AND POSTERIOR REPAIR  05/11/2012   Procedure:  ANTERIOR (CYSTOCELE) AND POSTERIOR REPAIR (RECTOCELE);  Surgeon: Reece Packer, MD;  [facility];  Service: Urology;;  with graft  . BREAST BIOPSY Right   . CARDIAC CATHETERIZATION  1999  . COLONOSCOPY    . CYSTOSCOPY WITH URETHRAL DILATATION  05/11/2012   Procedure: CYSTOSCOPY WITH URETHRAL DILATATION;  Surgeon: Reece Packer, MD;  [facility];  Service: Urology;;  . ESOPHAGOGASTRODUODENOSCOPY    . HERNIA REPAIR    . MASTECTOMY Right 2007  . PLANTAR FASCIA SURGERY Left   . THYROIDECTOMY N/A 02/23/2014   Procedure: TOTAL THYROIDECTOMY;  Surgeon: Armandina Gemma, MD;  [facility];  Service: General;  Laterality: N/A;  . TOTAL KNEE ARTHROPLASTY Right 07/02/2015  . TOTAL KNEE ARTHROPLASTY Right 07/02/2015   Procedure: TOTAL KNEE ARTHROPLASTY;  Surgeon: Elsie Saas, MD;  Location: [facility];  Service: Orthopedics;  Laterality: Right;  . TOTAL THYROIDECTOMY  1976   removed three tumor (2 on right; 1 on left)  . TUBAL LIGATION    . UMBILICAL HERNIA REPAIR  1986  . VAGINAL PROLAPSE REPAIR  05/11/2012   Procedure: VAGINAL VAULT SUSPENSION;  Surgeon: Reece Packer, MD;  [facility];  Service: Urology;;  . Clayborne Dana / PERINEUM BIOPSY     vulvar    There were no vitals filed for this visit.  Subjective Assessment - 04/23/17 1440    Subjective  feeling well - went to [place] to do some walking    Pertinent History  breast cancer, DM, HTN    Patient Stated Goals  be able to sit and drive with no pain, go to the movies, return to biking at the gym    Currently in Pain?  No/denies    Pain Score  0-No pain         [facility] PT Assessment - 04/23/17 0001      Strength   Right Hip Flexion  5/5    Right Hip ABduction  4+/5    Left Hip Flexion  5/5    Left Hip ABduction  4+/5    Right Knee Flexion  5/5    Right Knee Extension  5/5    Left Knee Flexion  5/5    Left Knee Extension  5/5                  [facility] Adult PT Treatment/Exercise - 04/23/17 1444      Lumbar  Exercises: Stretches   Passive Hamstring Stretch  2 reps;30 seconds    Passive Hamstring Stretch Limitations  B; seated    Piriformis Stretch  2 reps;30 seconds    Piriformis Stretch Limitations  seated fig 4      Lumbar Exercises: Standing   Functional Squats  15 reps B UE support      Lumbar Exercises: Supine   Bridge  15 reps;5 seconds    Bridge Limitations  + hip ABD isometric with green TB      Lumbar Exercises: Sidelying   Clam  15 reps;3 seconds    Clam Limitations  green tband    Hip Abduction  15 reps bilateral      Knee/Hip Exercises: Aerobic   Recumbent Bike  L2 x 6 min      Knee/Hip Exercises: Standing   Other Standing Knee Exercises  B side stepping - green tabnd at ankles - x 25 feet each direction               PT Short Term Goals - 04/02/17 0845      PT SHORT TERM GOAL #1   Title  Patient to be independent with initial HEP    Status  Achieved        PT Long Term Goals - 04/23/17 1442      PT LONG TERM GOAL #1   Title  Patient to be independent with advanced HEP    Status  Achieved      PT LONG TERM GOAL #2   Title  Patient to increase B LE strength to >/=4+/5 to improve functional mobility    Status  Achieved      PT LONG TERM GOAL #3   Title  Patient to report ability to sit at home or in car >/= 1 hr with pain no greater than 2/10    Status  Achieved      PT LONG TERM GOAL #4   Title  Patient to report ability to return to biking and walking at the gym with pain no greater than 2/10    Status  Achieved      PT LONG TERM GOAL #5   Title  Patient to report ability to sleep through the night without pain  limiting    Status  Achieved            Plan - 04/23/17 1443    Clinical Impression Statement  Victoria Walker has progressed very well with PT - great improvements in strength and functional mobility with patient able to verbilze adn demonstrate ways in which to prevent and manage pain. Discussion today ragarding transition to HEP at  this time as patient has met all goals and is planning to return to to [facility] on a more consistent basis to maintain current functional status. Will plan to follow-up with MD on Monday.     PT Treatment/Interventions  ADLs/Self Care Home Management;Cryotherapy;Electrical Stimulation;Iontophoresis '4mg'$ /ml Dexamethasone;Moist Heat;Traction;Ultrasound;Therapeutic exercise;Therapeutic activities;Functional mobility training;Neuromuscular re-education;Patient/family education;Passive range of motion;Manual techniques;Dry needling;Taping;Vasopneumatic Device    Consulted and Agree with Plan of Care  Patient       Patient will benefit from skilled therapeutic intervention in order to improve the following deficits and impairments:  Pain, Decreased activity tolerance, Decreased endurance, Decreased range of motion, Decreased strength, Decreased mobility  Visit Diagnosis: Acute right-sided low back pain with right-sided sciatica  Other symptoms and signs involving the musculoskeletal system  Muscle weakness (generalized)     Problem List Patient Active Problem List   Diagnosis Date Noted  . Right hip pain 03/17/2017  . Urinary frequency 03/10/2017  . Primary localized osteoarthritis of right knee   . History of MRSA infection   . History of measles   . Dysrhythmia   . Chronic rhinitis 06/06/2015  . Shortness of breath 06/06/2015  . Vaginitis and vulvovaginitis 04/25/2015  . Sciatica of right side 04/25/2015  . Medicare annual wellness visit, initial 04/07/2015  . Allergic response 01/01/2015  . History of chickenpox 09/26/2014  . History of shingles   . 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 Deformity of metatarsal bone of left foot 03/03/2014  . Vulvar irritation  S/P neg vulvar bx 12/2013 01/23/2014  . 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 Right knee DJD 07/12/2013  . Neuralgia of left foot 06/24/2013  . Tendonitis of ankle, right  04/08/2013  . Vaginal dryness, menopausal 04/05/2013  . Pronation deformity of ankle, acquired 02/09/2013  . Cortical cataract 02/01/2013  . H/O bone density study 01/26/2013  . Chest pain 01/05/2013  . History of iritis 01/04/2013  . Dry eye syndrome 10/07/2012  . Meibomian gland disease 10/07/2012  . Gastroesophageal reflux disease without esophagitis 10/05/2012  . Essential hypertension 10/04/2012  . Hyperlipidemia, mixed 10/04/2012  . 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 Diabetic neuropathy ([place]) 10/04/2012  . S/P hysterectomy 10/04/2012  . Osteopenia 10/04/2012  . Hypothyroidism 10/04/2012  . History of cystocele 10/04/2012  . Dyspareunia in female 10/04/2012  . Plantar fasciitis 10/04/2012  . Nuclear sclerotic cataract 06/17/2012  . Diabetes mellitus ([place]) 03/02/2012  . Iritis 01/06/2012  . Neuropathy 01/06/2012  . Disease of thyroid gland 01/06/2012  . Breast cancer ([place]) 05/06/2011     Lanney Gins, PT, DPT 04/23/17 3:25 PM   [facility] [address]  [place], [place], [address] Phone: [phone]   Fax:  [phone]  Name: Victoria Walker MRN: [id number] Date of Birth: March 09, 1951  PHYSICAL THERAPY DISCHARGE SUMMARY  Visits from Start of Care: 10  Current functional level related to goals / functional outcomes:   Refer to above clinical impression for status as of last visit on 04/23/17. Pt was placed on hold for 30  days and has not needed to return to PT, therefore will proceed with discharge from PT for this episode.   Remaining deficits:   As above.   Education / Equipment:   HEP & gym program at [employer]: Patient agrees to discharge.  Patient goals were met. Patient is being discharged due to meeting the stated rehab goals.  ?????       Percival Spanish, PT, MPT 06/01/17, 4:12 PM  [facility] [address]  [place], [place], [address] Phone: [phone]   Fax:  [phone]

## 2017-04-23 NOTE — Patient Instructions (Signed)
Hamstring stretch  1 foot out straight - bend forward at hips 3 x 30 seconds each side  Piriformis Stretch, Sitting   Sit, one ankle on opposite knee, same-side hand on crossed knee. Push down on knee, keeping spine straight. Lean torso forward, with flat back, until tension is felt in hamstrings and gluteals of crossed-leg side. Hold _30__ seconds.  Repeat _3__ times per session.   Bridge   Lie back, legs bent. Inhale, pressing hips up. Keeping ribs in, lengthen lower back. Exhale, rolling down along spine from top. Repeat __15__ times. Do __2__ sessions per day. **place band around knees*  HIP: Abduction / External Rotation (Band)   Place band around knees. Lie on side with hips and knees bent. Raise top knee up, squeezing glutes. Keep feet together. Hold _3__ seconds. Use __red or green_____ band. _15__ reps per set, __2_ sets per day  HIP: Abduction - Side-Lying   Lie on side, legs straight and in line with trunk. Squeeze glutes. Raise top leg up and slightly back. Point toes forward. _15__ reps per set, __2_ sets per day. Bend bottom leg to stabilize pelvis.  Squat: Double Leg (Supported)   With feet shoulder width apart, hold support. Squat, keeping lower leg vertical, knee in line with second toe. Use legs, do not pull up and down with arms. Repeat _15__ times per set.  Side stepping with green band around ankles

## 2017-04-27 ENCOUNTER — Ambulatory Visit (INDEPENDENT_AMBULATORY_CARE_PROVIDER_SITE_OTHER): Payer: Medicare Other | Admitting: Family Medicine

## 2017-04-27 ENCOUNTER — Encounter: Payer: Self-pay | Admitting: Family Medicine

## 2017-04-27 DIAGNOSIS — M25551 Pain in right hip: Secondary | ICD-10-CM

## 2017-04-27 NOTE — Patient Instructions (Signed)
Do your home exercises 2-3 times a week for 4-6 more weeks. Follow up with me as needed.

## 2017-04-28 DIAGNOSIS — H02889 Meibomian gland dysfunction of unspecified eye, unspecified eyelid: Secondary | ICD-10-CM | POA: Diagnosis not present

## 2017-04-28 DIAGNOSIS — H04123 Dry eye syndrome of bilateral lacrimal glands: Secondary | ICD-10-CM | POA: Diagnosis not present

## 2017-04-28 DIAGNOSIS — Z8669 Personal history of other diseases of the nervous system and sense organs: Secondary | ICD-10-CM | POA: Diagnosis not present

## 2017-04-28 DIAGNOSIS — H5340 Unspecified visual field defects: Secondary | ICD-10-CM | POA: Diagnosis not present

## 2017-04-28 DIAGNOSIS — H40023 Open angle with borderline findings, high risk, bilateral: Secondary | ICD-10-CM | POA: Diagnosis not present

## 2017-04-29 ENCOUNTER — Encounter: Payer: Self-pay | Admitting: Family Medicine

## 2017-04-29 NOTE — Assessment & Plan Note (Signed)
2/2 piriformis syndrome.  Much improved with physical therapy and home exercises.  Continue home exercises for 2-3 times a week for 4-6 more weeks.  Tylenol, lidocaine patches only if needed.  F/u prn.

## 2017-04-29 NOTE — Progress Notes (Signed)
PCP: Mosie Lukes, MD  Subjective:   HPI: Patient is a 67 y.o. female here for right hip pain.  03/16/17: Patient reports for about a month she's had posterior right hip pain radiating to the groin and down back of leg to the knee. Pain level 5/10 and sharp. Pain worse when riding bike. Has not tried anything for this yet - discussed lidocaine patches with PCP. No numbness, skin changes.  04/27/17: Patient reports her hip is doing well. She is doing physical therapy and home exercises. Reports the patch in therapy has helped. No swelling, other complaints. Currently 0/10 pain. No numbness.  Past Medical History:  Diagnosis Date  . Arthritis    "both knees" (07/02/2015)  . Cancer of right breast ([place])   . Claustrophobia   . Dysrhythmia    HX OF FAST HEART RATE AND PALPITATIONS - METOPROLOL HAS HELPED  . GERD (gastroesophageal reflux disease)   . H/O hiatal hernia   . H/O iritis    LEFT EYE - STATES HER EYE BECOMES RED AND VERY SENSITIVE TO LIGHT WHEN FLARE UP OF IRITIS  . Heart murmur    "benign; 06/2015"  . High cholesterol   . History of blood transfusion    "when I had my partial hysterectomy"  . History of chickenpox 09/26/2014  . History of measles   . History of MRSA infection   . History of shingles   . Hypertension   . Hypothyroidism   . Medicare annual wellness visit, initial 04/07/2015  . Multinodular thyroid    PT HAVING TROUBLE SWALLOWING  . Neuropathy    FEET  . Primary localized osteoarthritis of right knee   . Sciatica of right side 04/25/2015  . Spinal headache    spinal headache with epidural  . Type II diabetes mellitus (HCC)    ORAL MEDICATION - NO INSULIN  . Wears glasses     Current Outpatient Medications on File Prior to Visit  Medication Sig Dispense Refill  . aspirin 81 MG EC tablet Take 81 mg by mouth every other day. Swallow whole.    Marland Kitchen atorvastatin (LIPITOR) 40 MG tablet Take 1/2 tablet by mouth on Monday, Wednesday, Thursday, Friday  and Sunday, then take 1 tablet by mouth on Tuesday and Saturday. 58 tablet 3  . AVAPRO 150 MG tablet TAKE 1/2-1 TAB (75-150 MG TOTAL ) BY MOUTH DAILY 90 tablet 2  . calcium-vitamin D (OSCAL WITH D) 250-125 MG-UNIT per tablet Take 1 tablet by mouth daily.     . Cholecalciferol (VITAMIN D) 2000 UNITS CAPS Take 1 capsule by mouth every evening.     . Ciclopirox 1 % shampoo Apply 1 each topically every Friday.     . cycloSPORINE (RESTASIS) 0.05 % ophthalmic emulsion Place 1 drop into both eyes 2 (two) times daily.    . diclofenac sodium (VOLTAREN) 1 % GEL APPLY 2-4 GRAM TO AFFECTED AREA 4 TIMES A DAY AS NEEDED  1  . diltiazem (TIAZAC) 360 MG 24 hr capsule TAKE 1 CAPSULE (360 MG TOTAL) BY MOUTH EVERY MORNING. 90 capsule 3  . DOCOSAHEXAENOIC ACID PO Take by mouth.    . esomeprazole (NEXIUM) 40 MG capsule TAKE ONE CAPSULE BY MOUTH IN THE MORNING 90 capsule 2  . Estradiol 10 MCG TABS vaginal tablet 1 tablet per vagina at bedtime QOD 30 tablet 12  . gabapentin (NEURONTIN) 300 MG capsule Take 1 capsule (300 mg total) by mouth at bedtime. 270 capsule 1  . glucose blood (ONE  TOUCH TEST STRIPS) test strip Use as directed twice daily to check blood sugar. DX code E11.9 300 each 1  . levocetirizine (XYZAL) 5 MG tablet TAKE 1 TABLET (5 MG TOTAL) BY MOUTH EVERY EVENING FOR RUNNY NOSE OR ITCHING 90 tablet 2  . levothyroxine (SYNTHROID, LEVOTHROID) 75 MCG tablet TAKE 1 TABLET BY MOUTH MONDAY THROUHG FRIDAY & 1/2 TAB ON SATURDAY & SUNDAY 90 tablet 0  . loteprednol (LOTEMAX) 0.5 % ophthalmic suspension Place 1 drop into the left eye daily as needed ("arthiritis of the eye").     . metFORMIN (GLUCOPHAGE-XR) 750 MG 24 hr tablet TAKE 1 TABLET IN AM AND 1 TABLET AT LUNCH 180 tablet 1  . metoprolol succinate (TOPROL-XL) 25 MG 24 hr tablet TAKE 1 TABLET BY MOUTH TWICE A DAY AT 12 NOON AND 4PM 180 tablet 1  . mometasone (NASONEX) 50 MCG/ACT nasal spray Place 2 sprays into the nose daily. 17 g 12  . Olopatadine HCl 0.2 % SOLN  Place 1 drop into both eyes daily as needed.  11  . polyvinyl alcohol-povidone (REFRESH) 1.4-0.6 % ophthalmic solution Place 1 drop into both eyes 2 (two) times daily.    . Probiotic Product (PROBIOTIC DAILY PO) Take 1 capsule by mouth daily.    Marland Kitchen spironolactone (ALDACTONE) 25 MG tablet TAEK 1 & 1/2 TAB BY MOUTH EVERY DAY 180 tablet 0  . UNABLE TO FIND Vitamin E Vaginal Suppository     No current facility-administered medications on file prior to visit.     Past Surgical History:  Procedure Laterality Date  . ABDOMINAL HYSTERECTOMY  1988  . ABDOMINOPLASTY  1986  . ANTERIOR AND POSTERIOR REPAIR  05/11/2012   Procedure: ANTERIOR (CYSTOCELE) AND POSTERIOR REPAIR (RECTOCELE);  Surgeon: Reece Packer, MD;  Location: [facility];  Service: Urology;;  with graft  . BREAST BIOPSY Right   . CARDIAC CATHETERIZATION  1999  . COLONOSCOPY    . CYSTOSCOPY WITH URETHRAL DILATATION  05/11/2012   Procedure: CYSTOSCOPY WITH URETHRAL DILATATION;  Surgeon: Reece Packer, MD;  Location: [facility];  Service: Urology;;  . ESOPHAGOGASTRODUODENOSCOPY    . HERNIA REPAIR    . MASTECTOMY Right 2007  . PLANTAR FASCIA SURGERY Left   . THYROIDECTOMY N/A 02/23/2014   Procedure: TOTAL THYROIDECTOMY;  Surgeon: Armandina Gemma, MD;  Location: [facility];  Service: General;  Laterality: N/A;  . TOTAL KNEE ARTHROPLASTY Right 07/02/2015  . TOTAL KNEE ARTHROPLASTY Right 07/02/2015   Procedure: TOTAL KNEE ARTHROPLASTY;  Surgeon: Elsie Saas, MD;  Location: [place];  Service: Orthopedics;  Laterality: Right;  . TOTAL THYROIDECTOMY  1976   removed three tumor (2 on right; 1 on left)  . TUBAL LIGATION    . UMBILICAL HERNIA REPAIR  1986  . VAGINAL PROLAPSE REPAIR  05/11/2012   Procedure: VAGINAL VAULT SUSPENSION;  Surgeon: Reece Packer, MD;  Location: [facility];  Service: Urology;;  . Clayborne Dana / PERINEUM BIOPSY     vulvar    Allergies  Allergen Reactions  . Codeine Anxiety    Jittery   . Penicillins Itching  . Sulfa Antibiotics  Itching  . Vicodin [Hydrocodone-Acetaminophen] Itching    ITCHING     Social History   Socioeconomic History  . Marital status: Married    Spouse name: Not on file  . Number of children: 3  . Years of education: Not on file  . Highest education level: Not on file  Social Needs  . Financial resource strain: Not on file  .  Food insecurity - worry: Not on file  . Food insecurity - inability: Not on file  . Transportation needs - medical: Not on file  . Transportation needs - non-medical: Not on file  Occupational History    Comment: Retired  Tobacco Use  . Smoking status: Never Smoker  . Smokeless tobacco: Never Used  Substance and Sexual Activity  . Alcohol use: No    Alcohol/week: 0.0 oz  . Drug use: No  . Sexual activity: Yes    Birth control/protection: Surgical    Comment: lives with husband, retired from various jobs, diabetic diet  Other Topics Concern  . Not on file  Social History Narrative  . Not on file    Family History  Problem Relation Age of Onset  . Hypertension Mother   . Heart disease Mother        pacer, CHF  . Hyperlipidemia Mother   . Kidney disease Mother        1 kidney due to stone  . Kidney Stones Mother   . Hypertension Father   . Cancer Father        lung, psa  . Diabetes Father   . Alcohol abuse Father   . Diabetes Sister   . Cancer Sister   . Hypertension Sister   . Diabetes Brother   . Hypertension Brother   . Hyperlipidemia Brother   . Kidney disease Brother        dialysis  . Stroke Brother   . Hypertension Maternal Grandmother   . Heart disease Maternal Grandmother   . Stroke Maternal Grandmother   . Hypertension Maternal Grandfather   . Hypertension Daughter   . Arthritis Son   . Hyperlipidemia Sister   . Hypertension Sister   . Diabetes Sister   . Thyroid disease Sister   . Kidney disease Sister   . Cancer Sister        thyroid and breast  . Arthritis Sister        back pain  . Hypertension Sister   . Thyroid  disease Sister   . Hypertension Brother   . Hyperlipidemia Brother   . Hypertension Brother   . Hyperlipidemia Brother   . Benign prostatic hyperplasia Brother   . Hypertension Daughter   . Hyperlipidemia Daughter   . Diabetes Daughter   . Hypertension Maternal Aunt   . Hypertension Maternal Uncle   . Hypertension Paternal Aunt   . Cancer Paternal Aunt   . Hypertension Paternal Uncle     BP 120/77   Pulse 69   Ht 5\' 5"  (1.651 m)   Wt 180 lb (81.6 kg)   BMI 29.95 kg/m   Review of Systems: See HPI above.     Objective:  Physical Exam:  Gen: NAD, comfortable in exam room.  Back: No gross deformity, scoliosis. No TTP.  No midline or bony TTP. FROM without pain. Strength LEs 5/5 all muscle groups.   2+ MSRs in patellar and achilles tendons, equal bilaterally. Negative SLRs. Sensation intact to light touch bilaterally.  Right hip: No deformity. No TTP. FROM with 5/5 strength. Negative logroll Negative fabers and piriformis.   Assessment & Plan:  1. Right hip pain - 2/2 piriformis syndrome.  Much improved with physical therapy and home exercises.  Continue home exercises for 2-3 times a week for 4-6 more weeks.  Tylenol, lidocaine patches only if needed.  F/u prn.

## 2017-05-04 ENCOUNTER — Telehealth: Payer: Self-pay | Admitting: *Deleted

## 2017-05-04 NOTE — Telephone Encounter (Signed)
Received Physician Orders from [facility] for Diabetic supplies; forwarded to provider/SLS

## 2017-05-07 ENCOUNTER — Telehealth: Payer: Self-pay

## 2017-05-07 NOTE — Telephone Encounter (Signed)
PA initiated via Covermymeds; KEY: [id number]. Awaiting determination.

## 2017-05-07 NOTE — Telephone Encounter (Signed)
PA approved through 04/20/2018. 

## 2017-05-11 DIAGNOSIS — Z9011 Acquired absence of right breast and nipple: Secondary | ICD-10-CM | POA: Diagnosis not present

## 2017-05-20 ENCOUNTER — Telehealth: Payer: Self-pay

## 2017-05-20 NOTE — Telephone Encounter (Signed)
PA initiated via Covermymeds; KEY: [id number]. Awaiting determination.

## 2017-05-20 NOTE — Telephone Encounter (Signed)
Received PA approval through 04/20/2018.

## 2017-05-25 ENCOUNTER — Ambulatory Visit (INDEPENDENT_AMBULATORY_CARE_PROVIDER_SITE_OTHER): Payer: Medicare Other | Admitting: Obstetrics & Gynecology

## 2017-05-25 ENCOUNTER — Encounter: Payer: Self-pay | Admitting: Obstetrics & Gynecology

## 2017-05-25 VITALS — BP 127/72 | HR 63 | Ht 65.0 in | Wt 183.0 lb

## 2017-05-25 DIAGNOSIS — Z01419 Encounter for gynecological examination (general) (routine) without abnormal findings: Secondary | ICD-10-CM

## 2017-05-25 LAB — POCT URINALYSIS DIPSTICK
Bilirubin, UA: NEGATIVE
Blood, UA: NEGATIVE
Glucose, UA: NEGATIVE
Ketones, UA: NEGATIVE
Nitrite, UA: NEGATIVE
Protein, UA: NEGATIVE
Spec Grav, UA: 1.015 (ref 1.010–1.025)
Urobilinogen, UA: 0.2 E.U./dL
pH, UA: 6.5 (ref 5.0–8.0)

## 2017-05-25 NOTE — Progress Notes (Addendum)
Subjective:     Victoria Walker is a 67 y.o. female here for a routine exam.  Current complaints: she has been taking vaginal EES since Aug. She thinks that it is working well. She has no itching or urinary freq.  She takes one tab intravaginally every other week.  She is not current sexually active.  Her husband is being followed for prostate cancer. They just has a major celebration for their 50th year wedding anniversary.      Gynecologic History No LMP recorded. Patient has had a hysterectomy. Contraception: post menopausal status Last Pap: s/p hyst Last mammogram: 09/2016. Results were: normal. Mammogram of left breat only. Pt is s/p mastectomy for breast cancer.    Obstetric History OB History  Gravida Para Term Preterm AB Living  3 3 3     3   SAB TAB Ectopic Multiple Live Births               # Outcome Date GA Lbr Len/2nd Weight Sex Delivery Anes PTL Lv  3 Term 78    F Vag-Spont     2 Term 1973    F Vag-Spont     1 Term 34    M Vag-Spont        The following portions of the patient's history were reviewed and updated as appropriate: allergies, current medications, past family history, past medical history, past social history, past surgical history and problem list.  Review of Systems Pertinent items are noted in HPI.    Objective:  BP 127/72   Pulse 63   Ht 5\' 5"  (1.651 m)   Wt 183 lb (83 kg)   BMI 30.45 kg/m   General Appearance:    Alert, cooperative, no distress, appears stated age  Head:    Normocephalic, without obvious abnormality, atraumatic  Eyes:    conjunctiva/corneas clear, EOM's intact, both eyes  Ears:    Normal external ear canals, both ears  Nose:   Nares normal, septum midline, mucosa normal, no drainage    or sinus tenderness  Throat:   Lips, mucosa, and tongue normal; teeth and gums normal  Neck:   Supple, symmetrical, trachea midline, no adenopathy;    thyroid:  no enlargement/tenderness/nodules  Back:     Symmetric, no curvature, ROM  normal, no CVA tenderness  Lungs:     Clear to auscultation bilaterally, respirations unlabored  Chest Wall:    No tenderness or deformity   Heart:    Regular rate and rhythm, S1 and S2 normal, no murmur, rub   or gallop  Breast Exam:    right breast s/p mastectomy. On lesion noted. Chest wall examined and no masses or skin changes are present. No tenderness, masses, or nipple abnormality on left breast.  Abdomen:     Soft, non-tender, bowel sounds active all four quadrants,    no masses, no organomegaly. Well healed abdominoplasty incision    Genitalia:    Normal female without lesion, discharge or tenderness; well lubricated. Much improved from prev exams.      Extremities:   Extremities normal, atraumatic, no cyanosis or edema  Pulses:   2+ and symmetric all extremities  Skin:   Skin color, texture, turgor normal, no rashes or lesions     Assessment:    Healthy female exam.   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  Pt warned of increased risk of breast CA with EES HOWEVER, this is fairly low with the use of topical EES.  Pt  will cont to use once every 2 weeks.    Plan:    Follow up in: 1 year.    Cont Vagifem 44mcg 1 tab intravaginally q 2 weeks Mammogram in 09/2017  Waylyn Tenbrink L. Harraway-Smith, M.D., Cherlynn June

## 2017-05-27 DIAGNOSIS — E119 Type 2 diabetes mellitus without complications: Secondary | ICD-10-CM | POA: Diagnosis not present

## 2017-06-01 ENCOUNTER — Other Ambulatory Visit: Payer: Self-pay | Admitting: Family Medicine

## 2017-06-01 ENCOUNTER — Other Ambulatory Visit: Payer: Self-pay

## 2017-06-01 DIAGNOSIS — E785 Hyperlipidemia, unspecified: Secondary | ICD-10-CM

## 2017-06-01 DIAGNOSIS — Z780 Asymptomatic menopausal state: Secondary | ICD-10-CM

## 2017-06-01 DIAGNOSIS — R109 Unspecified abdominal pain: Secondary | ICD-10-CM

## 2017-06-01 DIAGNOSIS — I10 Essential (primary) hypertension: Secondary | ICD-10-CM

## 2017-06-01 DIAGNOSIS — K219 Gastro-esophageal reflux disease without esophagitis: Secondary | ICD-10-CM

## 2017-06-01 MED ORDER — GLUCOSE BLOOD VI STRP: ORAL_STRIP | 1 refills | Status: DC

## 2017-06-02 ENCOUNTER — Other Ambulatory Visit: Payer: Self-pay | Admitting: Family Medicine

## 2017-06-09 ENCOUNTER — Encounter: Payer: Self-pay | Admitting: Pediatrics

## 2017-06-09 ENCOUNTER — Ambulatory Visit (INDEPENDENT_AMBULATORY_CARE_PROVIDER_SITE_OTHER): Payer: Medicare Other | Admitting: Pediatrics

## 2017-06-09 VITALS — BP 118/70 | HR 61 | Temp 98.3°F | Resp 16 | Ht 65.0 in | Wt 180.0 lb

## 2017-06-09 DIAGNOSIS — J3089 Other allergic rhinitis: Secondary | ICD-10-CM | POA: Diagnosis not present

## 2017-06-09 DIAGNOSIS — K219 Gastro-esophageal reflux disease without esophagitis: Secondary | ICD-10-CM

## 2017-06-09 DIAGNOSIS — I10 Essential (primary) hypertension: Secondary | ICD-10-CM | POA: Diagnosis not present

## 2017-06-09 MED ORDER — LEVOCETIRIZINE DIHYDROCHLORIDE 5 MG PO TABS: ORAL_TABLET | ORAL | 2 refills | Status: DC

## 2017-06-09 MED ORDER — MOMETASONE FUROATE 50 MCG/ACT NA SUSP: NASAL | 3 refills | Status: DC

## 2017-06-09 NOTE — Progress Notes (Signed)
  [place] [id number] Dept: [phone]  FOLLOW UP NOTE  Patient ID: Victoria Walker, female    DOB: May 27, 1950  Age: 67 y.o. MRN: [id number] Date of Office Visit: 06/09/2017  Assessment  Chief Complaint: Allergies  HPI Achaia Garlock presents for follow-up of allergic rhinitis. She needs to use brand-name Nasonex 2 sprays per nostril once a day because the generic Nasonex nasal spray gave  her a rash on her face. She has failed fluticasone and Flonase. She is on levocetirizine 5 mg once a day. Her nasal symptoms are well controlled Her other medications are outlined in the chart   Drug Allergies:  Allergies  Allergen Reactions  . Codeine Anxiety    Jittery   . Penicillins Itching  . Sulfa Antibiotics Itching  . Vicodin [Hydrocodone-Acetaminophen] Itching    ITCHING     Physical Exam: BP 118/70   Pulse 61   Temp 98.3 F (36.8 C) (Oral)   Resp 16   Ht 5\' 5"  (1.651 m)   Wt 180 lb (81.6 kg)   SpO2 97%   BMI 29.95 kg/m    Physical Exam  Constitutional: She is oriented to person, place, and time. She appears well-developed and well-nourished.  HENT:  Eyes normal. Ears normal. Nose normal. Pharynx normal.  Neck: Neck supple.  Cardiovascular:  S 1 S2 normal no murmurs  Pulmonary/Chest:  Clear to percussion and auscultation  Lymphadenopathy:    She has no cervical adenopathy.  Neurological: She is alert and oriented to person, place, and time.  Psychiatric: She has a normal mood and affect. Her behavior is normal. Judgment and thought content normal.  Vitals reviewed.   Diagnostics:    Assessment and Plan: 1. Other allergic rhinitis   2. Essential hypertension   3. Gastroesophageal reflux disease without esophagitis     Meds ordered this encounter  Medications  . mometasone (NASONEX) 50 MCG/ACT nasal spray    Sig: Two sprays each nostril once a day if needed for nasal congestion or drainage.    Dispense:  51 g    Refill:  3    Brand  name.  Dispense 90 days supply  . levocetirizine (XYZAL) 5 MG tablet    Sig: TAKE 1 TABLET (5 MG TOTAL) BY MOUTH EVERY EVENING FOR RUNNY NOSE OR ITCHING    Dispense:  90 tablet    Refill:  2    Patient Instructions  Nasonex 2 sprays per nostril once a day for stuffy nose Levocetirizine 5 mg-take 1 tablet once a day for runny nose Call me if you're not doing well on this treatment plan   Return in about 1 year (around 06/09/2018).    Thank you for the opportunity to care for this patient.  Please do not hesitate to contact me with questions.  Penne Lash, M.D.  [facility] [address] [place], [place] [address] [phone]

## 2017-06-09 NOTE — Patient Instructions (Signed)
Nasonex 2 sprays per nostril once a day for stuffy nose Levocetirizine 5 mg-take 1 tablet once a day for runny nose Call me if you're not doing well on this treatment plan

## 2017-06-11 ENCOUNTER — Encounter: Payer: Self-pay | Admitting: Family Medicine

## 2017-06-11 ENCOUNTER — Ambulatory Visit (INDEPENDENT_AMBULATORY_CARE_PROVIDER_SITE_OTHER): Payer: Medicare Other | Admitting: Family Medicine

## 2017-06-11 VITALS — BP 118/76 | HR 75 | Temp 97.9°F | Resp 16 | Ht 64.96 in | Wt 182.2 lb

## 2017-06-11 DIAGNOSIS — I10 Essential (primary) hypertension: Secondary | ICD-10-CM

## 2017-06-11 DIAGNOSIS — N951 Menopausal and female climacteric states: Secondary | ICD-10-CM | POA: Diagnosis not present

## 2017-06-11 DIAGNOSIS — E039 Hypothyroidism, unspecified: Secondary | ICD-10-CM

## 2017-06-11 DIAGNOSIS — R350 Frequency of micturition: Secondary | ICD-10-CM | POA: Diagnosis not present

## 2017-06-11 DIAGNOSIS — E782 Mixed hyperlipidemia: Secondary | ICD-10-CM

## 2017-06-11 DIAGNOSIS — E118 Type 2 diabetes mellitus with unspecified complications: Secondary | ICD-10-CM

## 2017-06-11 LAB — POC URINALSYSI DIPSTICK (AUTOMATED)
Bilirubin, UA: NEGATIVE
Blood, UA: NEGATIVE
Glucose, UA: NEGATIVE
Ketones, UA: NEGATIVE
Leukocytes, UA: NEGATIVE
Nitrite, UA: NEGATIVE
Protein, UA: NEGATIVE
Spec Grav, UA: 1.02 (ref 1.010–1.025)
Urobilinogen, UA: 0.2 E.U./dL
pH, UA: 6 (ref 5.0–8.0)

## 2017-06-11 LAB — HEMOGLOBIN A1C: Hgb A1c MFr Bld: 6.7 % — ABNORMAL HIGH (ref 4.6–6.5)

## 2017-06-11 NOTE — Assessment & Plan Note (Signed)
Tolerating statin, encouraged heart healthy diet, avoid trans fats, minimize simple carbs and saturated fats. Increase exercise as tolerated 

## 2017-06-11 NOTE — Progress Notes (Signed)
Subjective:  I acted as a Education administrator for Victoria Walker. Victoria Walker, Victoria Walker   Patient ID: Victoria Walker, female    DOB: 1951-02-02, 67 y.o.   MRN: [id number]  Chief Complaint  Patient presents with  . Follow-up    HPI  Patient is in today for 3 month follow up visit and is feeling well today. She acknowledges she does not consistently follow a diabetic diet and can take in too many carbs. No polydipsia but she does endorse some polyuria at times. No hematuria or dysuria. No recent febrile illness or hospitalizations. Denies CP/palp/SOB/HA/congestion/fevers/GI c/o. Taking meds as prescribed  Patient Care Team: Mosie Lukes, MD as PCP - General (Family Medicine) Juanita Craver, MD as Consulting Physician (Gastroenterology) [place], Lois Huxley, MD (Ophthalmology) Camelia Phenes, DPM as Consulting Physician (Podiatry) Iona Beard, OD as Referring Physician (Optometry) Charlies Silvers, MD as Consulting Physician (Allergy and Immunology) Lavonia Drafts, MD as Consulting Physician (Obstetrics and Gynecology) Elsie Saas, MD as Consulting Physician (Orthopedic Surgery)   Past Medical History:  Diagnosis Date  . Arthritis    "both knees" (07/02/2015)  . Cancer of right breast ([place])   . Claustrophobia   . Dysrhythmia    HX OF FAST HEART RATE AND PALPITATIONS - METOPROLOL HAS HELPED  . GERD (gastroesophageal reflux disease)   . H/O hiatal hernia   . H/O iritis    LEFT EYE - STATES HER EYE BECOMES RED AND VERY SENSITIVE TO LIGHT WHEN FLARE UP OF IRITIS  . Heart murmur    "benign; 06/2015"  . High cholesterol   . History of blood transfusion    "when I had my partial hysterectomy"  . History of chickenpox 09/26/2014  . History of measles   . History of MRSA infection   . History of shingles   . Hypertension   . Hypothyroidism   . Medicare annual wellness visit, initial 04/07/2015  . Multinodular thyroid    PT HAVING TROUBLE SWALLOWING  . Neuropathy    FEET  . Primary  localized osteoarthritis of right knee   . Sciatica of right side 04/25/2015  . Spinal headache    spinal headache with epidural  . Type II diabetes mellitus ([facility])    ORAL MEDICATION - NO INSULIN  . Wears glasses     Past Surgical History:  Procedure Laterality Date  . ABDOMINAL HYSTERECTOMY  1988  . ABDOMINOPLASTY  1986  . ANTERIOR AND POSTERIOR REPAIR  05/11/2012   Procedure: ANTERIOR (CYSTOCELE) AND POSTERIOR REPAIR (RECTOCELE);  Surgeon: Reece Packer, MD;  Location: [facility];  Service: Urology;;  with graft  . BREAST BIOPSY Right   . CARDIAC CATHETERIZATION  1999  . COLONOSCOPY    . CYSTOSCOPY WITH URETHRAL DILATATION  05/11/2012   Procedure: CYSTOSCOPY WITH URETHRAL DILATATION;  Surgeon: Reece Packer, MD;  Location: [facility];  Service: Urology;;  . ESOPHAGOGASTRODUODENOSCOPY    . HERNIA REPAIR    . MASTECTOMY Right 2007  . PLANTAR FASCIA SURGERY Left   . THYROIDECTOMY N/A 02/23/2014   Procedure: TOTAL THYROIDECTOMY;  Surgeon: Armandina Gemma, MD;  Location: [facility];  Service: General;  Laterality: N/A;  . TOTAL KNEE ARTHROPLASTY Right 07/02/2015  . TOTAL KNEE ARTHROPLASTY Right 07/02/2015   Procedure: TOTAL KNEE ARTHROPLASTY;  Surgeon: Elsie Saas, MD;  Location: [facility];  Service: Orthopedics;  Laterality: Right;  . TOTAL THYROIDECTOMY  1976   removed three tumor (2 on right; 1 on left)  . TUBAL LIGATION    . UMBILICAL  HERNIA REPAIR  1986  . VAGINAL PROLAPSE REPAIR  05/11/2012   Procedure: VAGINAL VAULT SUSPENSION;  Surgeon: Reece Packer, MD;  Location: [facility];  Service: Urology;;  . VULVA / PERINEUM BIOPSY     vulvar    Family History  Problem Relation Age of Onset  . Hypertension Mother   . Heart disease Mother        pacer, CHF  . Hyperlipidemia Mother   . Kidney disease Mother        1 kidney due to stone  . Kidney Stones Mother   . Hypertension Father   . Cancer Father        lung, psa  . Diabetes Father   . Alcohol abuse Father   . Diabetes Sister     . Cancer Sister   . Hypertension Sister   . Diabetes Brother   . Hypertension Brother   . Hyperlipidemia Brother   . Kidney disease Brother        dialysis  . Stroke Brother   . Hypertension Maternal Grandmother   . Heart disease Maternal Grandmother   . Stroke Maternal Grandmother   . Hypertension Maternal Grandfather   . Hypertension Daughter   . Arthritis Son   . Hyperlipidemia Sister   . Hypertension Sister   . Diabetes Sister   . Thyroid disease Sister   . Kidney disease Sister   . Cancer Sister        thyroid and breast  . Arthritis Sister        back pain  . Hypertension Sister   . Thyroid disease Sister   . Hypertension Brother   . Hyperlipidemia Brother   . Hypertension Brother   . Hyperlipidemia Brother   . Benign prostatic hyperplasia Brother   . Hypertension Daughter   . Hyperlipidemia Daughter   . Diabetes Daughter   . Hypertension Maternal Aunt   . Hypertension Maternal Uncle   . Hypertension Paternal Aunt   . Cancer Paternal Aunt   . Hypertension Paternal Uncle     Social History   Socioeconomic History  . Marital status: Married    Spouse name: Not on file  . Number of children: 3  . Years of education: Not on file  . Highest education level: Not on file  Social Needs  . Financial resource strain: Not on file  . Food insecurity - worry: Not on file  . Food insecurity - inability: Not on file  . Transportation needs - medical: Not on file  . Transportation needs - non-medical: Not on file  Occupational History    Comment: Retired  Tobacco Use  . Smoking status: Never Smoker  . Smokeless tobacco: Never Used  Substance and Sexual Activity  . Alcohol use: No    Alcohol/week: 0.0 oz  . Drug use: No  . Sexual activity: Yes    Birth control/protection: Surgical    Comment: lives with husband, retired from various jobs, diabetic diet  Other Topics Concern  . Not on file  Social History Narrative  . Not on file    Outpatient Medications  Prior to Visit  Medication Sig Dispense Refill  . aspirin 81 MG EC tablet Take 81 mg by mouth every other day. Swallow whole.    Marland Kitchen atorvastatin (LIPITOR) 40 MG tablet TAKE 1/2 TABLET ON MONDAY/WEDNESDAY/THURSDAY/FRIDAY AND SUNDAY AND 1 TAB ON TUESDAY/SATURDAY 58 tablet 3  . AVAPRO 150 MG tablet TAKE 1/2-1 TAB (75-150 MG TOTAL ) BY MOUTH DAILY 90 tablet  2  . calcium-vitamin D (OSCAL WITH D) 250-125 MG-UNIT per tablet Take 1 tablet by mouth daily.     . Cholecalciferol (VITAMIN D) 2000 UNITS CAPS Take 1 capsule by mouth every evening.     . Ciclopirox 1 % shampoo Apply 1 each topically every Friday.     . cycloSPORINE (RESTASIS) 0.05 % ophthalmic emulsion Place 1 drop into both eyes 2 (two) times daily.    . diclofenac sodium (VOLTAREN) 1 % GEL APPLY 2-4 GRAM TO AFFECTED AREA 4 TIMES A DAY AS NEEDED  1  . diltiazem (TIAZAC) 360 MG 24 hr capsule TAKE 1 CAPSULE (360 MG TOTAL) BY MOUTH EVERY MORNING. 90 capsule 3  . DOCOSAHEXAENOIC ACID PO Take by mouth.    . esomeprazole (NEXIUM) 40 MG capsule TAKE ONE CAPSULE BY MOUTH IN THE MORNING 90 capsule 2  . Estradiol 10 MCG TABS vaginal tablet 1 tablet per vagina at bedtime QOD 30 tablet 12  . gabapentin (NEURONTIN) 300 MG capsule TAKE 1 CAPSULE (300 MG TOTAL) BY MOUTH AT BEDTIME. 270 capsule 0  . glucose blood (ONE TOUCH TEST STRIPS) test strip Use as directed twice daily to check blood sugar. DX code E11.9 300 each 1  . levocetirizine (XYZAL) 5 MG tablet TAKE 1 TABLET (5 MG TOTAL) BY MOUTH EVERY EVENING FOR RUNNY NOSE OR ITCHING 90 tablet 2  . levothyroxine (SYNTHROID, LEVOTHROID) 75 MCG tablet TAKE 1 TABLET BY MOUTH MONDAY THROUHG FRIDAY & 1/2 TAB ON SATURDAY & SUNDAY 90 tablet 0  . levothyroxine (SYNTHROID, LEVOTHROID) 75 MCG tablet TAKE ONE TABLET MONDAY THROUGH FRIDAY AND ONE HALF TABLET SATURDAY AND SUNDAY 90 tablet 0  . loteprednol (LOTEMAX) 0.5 % ophthalmic suspension Place 1 drop into the left eye daily as needed ("arthiritis of the eye").     .  metFORMIN (GLUCOPHAGE-XR) 750 MG 24 hr tablet TAKE 1 TABLET IN AM AND 1 TABLET AT LUNCH 180 tablet 1  . metoprolol succinate (TOPROL-XL) 25 MG 24 hr tablet TAKE 1 TABLET BY MOUTH TWICE A DAY AT 12 NOON AND 4PM 180 tablet 1  . mometasone (NASONEX) 50 MCG/ACT nasal spray Two sprays each nostril once a day if needed for nasal congestion or drainage. 51 g 3  . Olopatadine HCl 0.2 % SOLN Place 1 drop into both eyes daily as needed.  11  . polyvinyl alcohol-povidone (REFRESH) 1.4-0.6 % ophthalmic solution Place 1 drop into both eyes 2 (two) times daily.    . Probiotic Product (PROBIOTIC DAILY PO) Take 1 capsule by mouth daily.    Marland Kitchen spironolactone (ALDACTONE) 25 MG tablet TAEK 1 & 1/2 TAB BY MOUTH EVERY DAY 180 tablet 0  . UNABLE TO FIND Vitamin E Vaginal Suppository     No facility-administered medications prior to visit.     Allergies  Allergen Reactions  . Codeine Anxiety    Jittery   . Penicillins Itching  . Sulfa Antibiotics Itching  . Vicodin [Hydrocodone-Acetaminophen] Itching    ITCHING     Review of Systems  Constitutional: Negative for fever and malaise/fatigue.  HENT: Negative for congestion.   Eyes: Negative for blurred vision.  Respiratory: Negative for shortness of breath.   Cardiovascular: Negative for chest pain, palpitations and leg swelling.  Gastrointestinal: Negative for abdominal pain, blood in stool and nausea.  Genitourinary: Positive for frequency. Negative for dysuria, hematuria and urgency.  Musculoskeletal: Negative for falls.  Skin: Negative for rash.  Neurological: Negative for dizziness, loss of consciousness and headaches.  Endo/Heme/Allergies: Negative for  environmental allergies. Does not bruise/bleed easily.  Psychiatric/Behavioral: Negative for depression. The patient is not nervous/anxious.        Objective:    Physical Exam  Constitutional: She is oriented to person, place, and time. She appears well-developed and well-nourished. No distress.    HENT:  Head: Normocephalic and atraumatic.  Nose: Nose normal.  Eyes: Right eye exhibits no discharge. Left eye exhibits no discharge.  Neck: Normal range of motion. Neck supple.  Cardiovascular: Normal rate and regular rhythm.  No murmur heard. Pulmonary/Chest: Effort normal and breath sounds normal.  Abdominal: Soft. Bowel sounds are normal. There is no tenderness.  Musculoskeletal: She exhibits no edema.  Neurological: She is alert and oriented to person, place, and time.  Skin: Skin is warm and dry.  Psychiatric: She has a normal mood and affect.  Nursing note and vitals reviewed.   BP 118/76 (BP Location: Left Arm, Patient Position: Sitting, Cuff Size: Normal)   Pulse 75   Temp 97.9 F (36.6 C) (Oral)   Resp 16   Ht 5' 4.96" (1.65 m)   Wt 182 lb 3.2 oz (82.6 kg)   SpO2 98%   BMI 30.36 kg/m  Wt Readings from Last 3 Encounters:  06/11/17 182 lb 3.2 oz (82.6 kg)  06/09/17 180 lb (81.6 kg)  05/25/17 183 lb (83 kg)   BP Readings from Last 3 Encounters:  06/11/17 118/76  06/09/17 118/70  05/25/17 127/72     Immunization History  Administered Date(s) Administered  . Influenza, High Dose Seasonal PF 01/21/2016, 02/04/2017  . Influenza,inj,Quad PF,6+ Mos 01/04/2013, 01/23/2014, 12/28/2014  . Pneumococcal Conjugate-13 12/28/2014  . Pneumococcal Polysaccharide-23 05/12/2012  . Tdap 05/02/2014    Health Maintenance  Topic Date Due  . FOOT EXAM  02/24/2017  . PNA vac Low Risk Adult (2 of 2 - PPSV23) 05/12/2017  . OPHTHALMOLOGY EXAM  06/19/2017  . HEMOGLOBIN A1C  12/09/2017  . MAMMOGRAM  10/10/2018  . COLONOSCOPY  07/20/2021  . TETANUS/TDAP  05/02/2024  . INFLUENZA VACCINE  Completed  . DEXA SCAN  Completed  . Hepatitis C Screening  Completed    Lab Results  Component Value Date   WBC 7.0 03/10/2017   HGB 12.8 03/10/2017   HCT 39.2 03/10/2017   PLT 267.0 03/10/2017   GLUCOSE 70 03/10/2017   CHOL 183 03/10/2017   TRIG 188.0 (H) 03/10/2017   HDL 41.60  03/10/2017   LDLDIRECT 107.0 07/01/2016   LDLCALC 104 (H) 03/10/2017   ALT 22 03/10/2017   AST 20 03/10/2017   NA 141 03/10/2017   K 4.6 03/10/2017   CL 104 03/10/2017   CREATININE 0.87 03/10/2017   BUN 15 03/10/2017   CO2 30 03/10/2017   TSH 2.86 03/10/2017   INR 1.01 06/21/2015   HGBA1C 6.7 (H) 06/11/2017   MICROALBUR <0.7 12/28/2014    Lab Results  Component Value Date   TSH 2.86 03/10/2017   Lab Results  Component Value Date   WBC 7.0 03/10/2017   HGB 12.8 03/10/2017   HCT 39.2 03/10/2017   MCV 95.0 03/10/2017   PLT 267.0 03/10/2017   Lab Results  Component Value Date   NA 141 03/10/2017   K 4.6 03/10/2017   CO2 30 03/10/2017   GLUCOSE 70 03/10/2017   BUN 15 03/10/2017   CREATININE 0.87 03/10/2017   BILITOT 0.4 03/10/2017   ALKPHOS 56 03/10/2017   AST 20 03/10/2017   ALT 22 03/10/2017   PROT 7.0 03/10/2017   ALBUMIN 4.5 03/10/2017  CALCIUM 9.9 03/10/2017   ANIONGAP 9 07/04/2015   GFR 83.45 03/10/2017   Lab Results  Component Value Date   CHOL 183 03/10/2017   Lab Results  Component Value Date   HDL 41.60 03/10/2017   Lab Results  Component Value Date   LDLCALC 104 (H) 03/10/2017   Lab Results  Component Value Date   TRIG 188.0 (H) 03/10/2017   Lab Results  Component Value Date   CHOLHDL 4 03/10/2017   Lab Results  Component Value Date   HGBA1C 6.7 (H) 06/11/2017         Assessment & Plan:   Problem List Items Addressed This Visit    Essential hypertension    Well controlled, no changes to meds. Encouraged heart healthy diet such as the DASH diet and exercise as tolerated.       Relevant Orders   CBC   Comprehensive metabolic panel   TSH   Hyperlipidemia, mixed    Tolerating statin, encouraged heart healthy diet, avoid trans fats, minimize simple carbs and saturated fats. Increase exercise as tolerated      Relevant Orders   Lipid panel   Hypothyroidism    On Levothyroxine, continue to monitor      Vaginal dryness,  menopausal    Good response to Vagifem with use every week to two. Follows with Dr Ihor Dow      Diabetes mellitus ([place])    hgba1c acceptable, minimize simple carbs. Increase exercise as tolerated. Continue current meds      Relevant Orders   Hemoglobin A1c (Completed)   Hemoglobin A1c   Frequent urination - Primary   Relevant Orders   POCT Urinalysis Dipstick (Automated) (Completed)      I am having Victoria Walker maintain her calcium-vitamin D, loteprednol, Ciclopirox, cycloSPORINE, polyvinyl alcohol-povidone, Vitamin D, Probiotic Product (PROBIOTIC DAILY PO), aspirin, DOCOSAHEXAENOIC ACID PO, UNABLE TO FIND, diltiazem, esomeprazole, AVAPRO, Olopatadine HCl, levothyroxine, Estradiol, spironolactone, diclofenac sodium, metoprolol succinate, gabapentin, metFORMIN, atorvastatin, glucose blood, levothyroxine, mometasone, and levocetirizine.  No orders of the defined types were placed in this encounter.   CMA served as Education administrator during this visit. History, Physical and Plan performed by medical provider. Documentation and orders reviewed and attested to.  Penni Homans, MD

## 2017-06-11 NOTE — Assessment & Plan Note (Signed)
On Levothyroxine, continue to monitor 

## 2017-06-11 NOTE — Patient Instructions (Signed)

## 2017-06-11 NOTE — Assessment & Plan Note (Signed)
Good response to Vagifem with use every week to two. Follows with Dr Ihor Dow

## 2017-06-11 NOTE — Assessment & Plan Note (Signed)
hgba1c acceptable, minimize simple carbs. Increase exercise as tolerated. Continue current meds 

## 2017-06-11 NOTE — Assessment & Plan Note (Signed)
Well controlled, no changes to meds. Encouraged heart healthy diet such as the DASH diet and exercise as tolerated.  °

## 2017-06-15 ENCOUNTER — Telehealth: Payer: Self-pay | Admitting: *Deleted

## 2017-06-15 NOTE — Telephone Encounter (Signed)
Received Physician Orders from [facility]; forwarded to provider/SLS 02/25

## 2017-06-17 NOTE — Telephone Encounter (Signed)
Orders signed and faxed to [phone]. Form sent for scanning.

## 2017-06-18 DIAGNOSIS — Z9011 Acquired absence of right breast and nipple: Secondary | ICD-10-CM | POA: Diagnosis not present

## 2017-06-23 DIAGNOSIS — M1712 Unilateral primary osteoarthritis, left knee: Secondary | ICD-10-CM | POA: Diagnosis not present

## 2017-06-23 DIAGNOSIS — Z96651 Presence of right artificial knee joint: Secondary | ICD-10-CM | POA: Diagnosis not present

## 2017-06-29 ENCOUNTER — Telehealth: Payer: Self-pay | Admitting: *Deleted

## 2017-06-29 NOTE — Telephone Encounter (Signed)
Received Physician Orders from [facility]; forwarded to provider/SLS 03/11  

## 2017-07-08 ENCOUNTER — Telehealth: Payer: Self-pay | Admitting: *Deleted

## 2017-07-08 NOTE — Telephone Encounter (Signed)
Received Physician Orders from [facility]; forwarded to provider/SLS 03/20

## 2017-07-09 ENCOUNTER — Telehealth: Payer: Self-pay | Admitting: *Deleted

## 2017-07-09 NOTE — Telephone Encounter (Signed)
Received Physician Orders from [facility]; forwarded to provider/SLS 03/21

## 2017-07-14 ENCOUNTER — Other Ambulatory Visit: Payer: Self-pay | Admitting: Family Medicine

## 2017-07-21 DIAGNOSIS — E119 Type 2 diabetes mellitus without complications: Secondary | ICD-10-CM | POA: Diagnosis not present

## 2017-07-21 DIAGNOSIS — H2513 Age-related nuclear cataract, bilateral: Secondary | ICD-10-CM | POA: Diagnosis not present

## 2017-07-23 ENCOUNTER — Telehealth: Payer: Self-pay | Admitting: *Deleted

## 2017-07-23 NOTE — Telephone Encounter (Signed)
Received Physician Orders from [facility]; forwarded to provider/SLS 04/04  ;

## 2017-07-23 NOTE — Telephone Encounter (Signed)
Opened in Error/SLS

## 2017-07-27 ENCOUNTER — Other Ambulatory Visit: Payer: Self-pay | Admitting: Family Medicine

## 2017-07-28 ENCOUNTER — Telehealth: Payer: Self-pay | Admitting: *Deleted

## 2017-07-28 NOTE — Telephone Encounter (Signed)
Received Physician Orders from [place]; forwarded to provider/SLS 04/09

## 2017-08-05 ENCOUNTER — Telehealth: Payer: Self-pay | Admitting: *Deleted

## 2017-08-05 NOTE — Telephone Encounter (Signed)
Received Physician Orders from [facility]; forwarded to provider/SLS 04/17

## 2017-08-18 ENCOUNTER — Telehealth: Payer: Self-pay | Admitting: *Deleted

## 2017-08-18 NOTE — Telephone Encounter (Signed)
Received Physician Orders from [facility]; forwarded to provider/SLS 04/30

## 2017-08-22 ENCOUNTER — Other Ambulatory Visit: Payer: Self-pay | Admitting: Family Medicine

## 2017-08-23 ENCOUNTER — Other Ambulatory Visit: Payer: Self-pay

## 2017-08-23 ENCOUNTER — Emergency Department (INDEPENDENT_AMBULATORY_CARE_PROVIDER_SITE_OTHER)
Admission: EM | Admit: 2017-08-23 | Discharge: 2017-08-23 | Disposition: A | Discharge status: Home / Self Care | Payer: Medicare Other | Source: Home / Self Care

## 2017-08-23 DIAGNOSIS — J019 Acute sinusitis, unspecified: Secondary | ICD-10-CM

## 2017-08-23 MED ORDER — FLUCONAZOLE 150 MG PO TABS: 150.0000 mg | ORAL_TABLET | Freq: Once | ORAL | 0 refills | Status: AC

## 2017-08-23 MED ORDER — BENZONATATE 100 MG PO CAPS: 100.0000 mg | ORAL_CAPSULE | Freq: Three times a day (TID) | ORAL | 1 refills | Status: DC | PRN

## 2017-08-23 MED ORDER — AZITHROMYCIN 250 MG PO TABS: ORAL_TABLET | ORAL | 0 refills | Status: DC

## 2017-08-23 NOTE — ED Triage Notes (Signed)
Pt c/o itchy/tickly throat all week. Somewhat productive cough with some green sputum. Took mucinex yesterday and today.

## 2017-08-23 NOTE — ED Provider Notes (Signed)
[facility]    CSN: [id number] Arrival date & time: 08/23/17  1532     History   Chief Complaint Chief Complaint  Patient presents with  . Cough    HPI Victoria Walker is a 67 y.o. female.  Presents for evaluation of sinus congestion, cough, and sore throat. Symptoms have been present mildly for 1 week however over the course of the last 4 days, cough has worsened. She complains of worsening sore throat secondary to persistent postnasal drainage.  She chronically takes antihistamine therapy for allergies and uses nasal spray.  None of these measures have improved symptoms.  She denies headache, shortness of breath, wheezing, dizziness, or chest tightness. Past Medical History:  Diagnosis Date  . Arthritis    "both knees" (07/02/2015)  . Cancer of right breast ([place])   . Claustrophobia   . Dysrhythmia    HX OF FAST HEART RATE AND PALPITATIONS - METOPROLOL HAS HELPED  . GERD (gastroesophageal reflux disease)   . H/O hiatal hernia   . H/O iritis    LEFT EYE - STATES HER EYE BECOMES RED AND VERY SENSITIVE TO LIGHT WHEN FLARE UP OF IRITIS  . Heart murmur    "benign; 06/2015"  . High cholesterol   . History of blood transfusion    "when I had my partial hysterectomy"  . History of chickenpox 09/26/2014  . History of measles   . History of MRSA infection   . History of shingles   . Hypertension   . Hypothyroidism   . Medicare annual wellness visit, initial 04/07/2015  . Multinodular thyroid    PT HAVING TROUBLE SWALLOWING  . Neuropathy    FEET  . Primary localized osteoarthritis of right knee   . Sciatica of right side 04/25/2015  . Spinal headache    spinal headache with epidural  . Type II diabetes mellitus (HCC)    ORAL MEDICATION - NO INSULIN  . Wears glasses     Patient Active Problem List   Diagnosis Date Noted  . Right hip pain 03/17/2017  . Frequent urination 03/10/2017  . Primary localized osteoarthritis of right knee   . History of MRSA  infection   . History of measles   . Dysrhythmia   . Chronic rhinitis 06/06/2015  . Shortness of breath 06/06/2015  . Vaginitis and vulvovaginitis 04/25/2015  . Sciatica of right side 04/25/2015  . Medicare annual wellness visit, initial 04/07/2015  . Allergic response 01/01/2015  . History of chickenpox 09/26/2014  . History of shingles   . 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 Deformity of metatarsal bone of left foot 03/03/2014  . Vulvar irritation  S/P neg vulvar bx 12/2013 01/23/2014  . 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 Right knee DJD 07/12/2013  . Neuralgia of left foot 06/24/2013  . Tendonitis of ankle, right 04/08/2013  . Vaginal dryness, menopausal 04/05/2013  . Pronation deformity of ankle, acquired 02/09/2013  . Cortical cataract 02/01/2013  . H/O bone density study 01/26/2013  . Chest pain 01/05/2013  . History of iritis 01/04/2013  . Dry eye syndrome 10/07/2012  . Meibomian gland disease 10/07/2012  . Gastroesophageal reflux disease without esophagitis 10/05/2012  . Essential hypertension 10/04/2012  . Hyperlipidemia, mixed 10/04/2012  . 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 Diabetic neuropathy ([place]) 10/04/2012  . S/P hysterectomy 10/04/2012  . Osteopenia 10/04/2012  . Hypothyroidism 10/04/2012  . History of cystocele 10/04/2012  . Dyspareunia in female  10/04/2012  . Plantar fasciitis 10/04/2012  . Nuclear sclerotic cataract 06/17/2012  . Diabetes mellitus ([place]) 03/02/2012  . Iritis 01/06/2012  . Neuropathy 01/06/2012  . Disease of thyroid gland 01/06/2012  . Breast cancer ([place]) 05/06/2011    Past Surgical History:  Procedure Laterality Date  . ABDOMINAL HYSTERECTOMY  1988  . ABDOMINOPLASTY  1986  . ANTERIOR AND POSTERIOR REPAIR  05/11/2012   Procedure: ANTERIOR (CYSTOCELE) AND POSTERIOR REPAIR (RECTOCELE);  Surgeon: Reece Packer, MD;  Location: [facility];  Service: Urology;;  with graft  . BREAST  BIOPSY Right   . CARDIAC CATHETERIZATION  1999  . COLONOSCOPY    . CYSTOSCOPY WITH URETHRAL DILATATION  05/11/2012   Procedure: CYSTOSCOPY WITH URETHRAL DILATATION;  Surgeon: Reece Packer, MD;  Location: [facility];  Service: Urology;;  . ESOPHAGOGASTRODUODENOSCOPY    . HERNIA REPAIR    . MASTECTOMY Right 2007  . PLANTAR FASCIA SURGERY Left   . THYROIDECTOMY N/A 02/23/2014   Procedure: TOTAL THYROIDECTOMY;  Surgeon: Armandina Gemma, MD;  Location: [facility];  Service: General;  Laterality: N/A;  . TOTAL KNEE ARTHROPLASTY Right 07/02/2015  . TOTAL KNEE ARTHROPLASTY Right 07/02/2015   Procedure: TOTAL KNEE ARTHROPLASTY;  Surgeon: Elsie Saas, MD;  Location: [place];  Service: Orthopedics;  Laterality: Right;  . TOTAL THYROIDECTOMY  1976   removed three tumor (2 on right; 1 on left)  . TUBAL LIGATION    . UMBILICAL HERNIA REPAIR  1986  . VAGINAL PROLAPSE REPAIR  05/11/2012   Procedure: VAGINAL VAULT SUSPENSION;  Surgeon: Reece Packer, MD;  Location: [facility];  Service: Urology;;  . [place]borne Dana / PERINEUM BIOPSY     vulvar    OB History    Gravida  3   Para  3   Term  3   Preterm      AB      Living  3     SAB      TAB      Ectopic      Multiple      Live Births               Home Medications    Prior to Admission medications   Medication Sig Start Date End Date Taking? Authorizing Provider  aspirin 81 MG EC tablet Take 81 mg by mouth every other day. Swallow whole.    [provider]  atorvastatin (LIPITOR) 40 MG tablet TAKE 1/2 TABLET ON MONDAY/WEDNESDAY/THURSDAY/FRIDAY AND SUNDAY AND 1 TAB ON TUESDAY/SATURDAY 06/01/17   Mosie Lukes, MD  AVAPRO 150 MG tablet TAKE 1/2 TO 1 (ONE-HALF TO ONE) TABLET BY MOUTH ONCE DAILY 07/16/17   Mosie Lukes, MD  azithromycin (ZITHROMAX) 250 MG tablet Take 2 tabs PO x 1 dose, then 1 tab PO QD x 4 days 08/23/17   Scot Jun, FNP  benzonatate (TESSALON) 100 MG capsule Take 1-2 capsules (100-200 mg total) by mouth 3  (three) times daily as needed for cough. 08/23/17   Scot Jun, FNP  calcium-vitamin D (OSCAL WITH D) 250-125 MG-UNIT per tablet Take 1 tablet by mouth daily.     [provider]  Cholecalciferol (VITAMIN D) 2000 UNITS CAPS Take 1 capsule by mouth every evening.     [provider]  Ciclopirox 1 % shampoo Apply 1 each topically every Friday.  06/09/13   [provider]  cycloSPORINE (RESTASIS) 0.05 % ophthalmic emulsion Place 1 drop into both eyes 2 (two) times daily.  [provider]  diclofenac sodium (VOLTAREN) 1 % GEL APPLY 2-4 GRAM TO AFFECTED AREA 4 TIMES A DAY AS NEEDED 04/12/17   [provider]  diltiazem (TIAZAC) 360 MG 24 hr capsule TAKE 1 CAPSULE (360 MG TOTAL) BY MOUTH EVERY MORNING. 07/27/17   Mosie Lukes, MD  DOCOSAHEXAENOIC ACID PO Take by mouth.    [provider]  esomeprazole (NEXIUM) 40 MG capsule TAKE ONE CAPSULE BY MOUTH IN THE MORNING 08/21/16   Mosie Lukes, MD  Estradiol 10 MCG TABS vaginal tablet 1 tablet per vagina at bedtime QOD 12/10/16   Emily Filbert, MD  fluconazole (DIFLUCAN) 150 MG tablet Take 1 tablet (150 mg total) by mouth once for 1 dose. Repeat if needed up to 2 times. 08/23/17 08/23/17  Scot Jun, FNP  gabapentin (NEURONTIN) 300 MG capsule TAKE 1 CAPSULE (300 MG TOTAL) BY MOUTH AT BEDTIME. 06/01/17   Mosie Lukes, MD  glucose blood (ONE TOUCH TEST STRIPS) test strip Use as directed twice daily to check blood sugar. DX code E11.9 06/01/17   Mosie Lukes, MD  levocetirizine (XYZAL) 5 MG tablet TAKE 1 TABLET (5 MG TOTAL) BY MOUTH EVERY EVENING FOR RUNNY NOSE OR ITCHING 06/09/17   Bardelas, Jose A, MD  levothyroxine (SYNTHROID, LEVOTHROID) 75 MCG tablet TAKE 1 TABLET BY MOUTH MONDAY THROUHG FRIDAY & 1/2 TAB ON SATURDAY & SUNDAY 12/09/16   Mosie Lukes, MD  loteprednol (LOTEMAX) 0.5 % ophthalmic suspension Place 1 drop into the left eye daily as needed ("arthiritis of the eye").     [provider]  metFORMIN (GLUCOPHAGE-XR) 750 MG 24 hr tablet TAKE 1 TABLET IN AM AND 1 TABLET AT LUNCH 06/01/17   Mosie Lukes, MD  metoprolol succinate (TOPROL-XL) 25 MG 24 hr tablet TAKE 1 TABLET BY MOUTH TWICE A DAY AT 12 NOON AND 4PM 06/01/17   Mosie Lukes, MD  mometasone (NASONEX) 50 MCG/ACT nasal spray Two sprays each nostril once a day if needed for nasal congestion or drainage. 06/09/17   Charlies Silvers, MD  Olopatadine HCl 0.2 % SOLN Place 1 drop into both eyes daily as needed. 10/22/16   [provider]  polyvinyl alcohol-povidone (REFRESH) 1.4-0.6 % ophthalmic solution Place 1 drop into both eyes 2 (two) times daily.    [provider]  Probiotic Product (PROBIOTIC DAILY PO) Take 1 capsule by mouth daily.    [provider]  spironolactone (ALDACTONE) 25 MG tablet TAEK 1 & 1/2 TAB BY MOUTH EVERY DAY 04/23/17   Mosie Lukes, MD  UNABLE TO FIND Vitamin E Vaginal Suppository    [provider]    Family History Family History  Problem Relation Age of Onset  . Hypertension Mother   . Heart disease Mother        pacer, CHF  . Hyperlipidemia Mother   . Kidney disease Mother        1 kidney due to stone  . Kidney Stones Mother   . Hypertension Father   . Cancer Father        lung, psa  . Diabetes Father   . Alcohol abuse Father   . Diabetes Sister   . Cancer Sister   . Hypertension Sister   . Diabetes Brother   . Hypertension Brother   . Hyperlipidemia Brother   . Kidney disease Brother        dialysis  . Stroke Brother   . Hypertension Maternal Grandmother   .  Heart disease Maternal Grandmother   . Stroke Maternal Grandmother   . Hypertension Maternal Grandfather   . Hypertension Daughter   . Arthritis Son   . Hyperlipidemia Sister   . Hypertension Sister   . Diabetes Sister   . Thyroid disease Sister   . Kidney disease Sister   . Cancer Sister        thyroid and breast  . Arthritis Sister        back pain  .  Hypertension Sister   . Thyroid disease Sister   . Hypertension Brother   . Hyperlipidemia Brother   . Hypertension Brother   . Hyperlipidemia Brother   . Benign prostatic hyperplasia Brother   . Hypertension Daughter   . Hyperlipidemia Daughter   . Diabetes Daughter   . Hypertension Maternal Aunt   . Hypertension Maternal Uncle   . Hypertension Paternal Aunt   . Cancer Paternal Aunt   . Hypertension Paternal Uncle     Social History Social History   Tobacco Use  . Smoking status: Never Smoker  . Smokeless tobacco: Never Used  Substance Use Topics  . Alcohol use: No    Alcohol/week: 0.0 oz  . Drug use: No     Allergies   Codeine; Penicillins; Sulfa antibiotics; and Vicodin [hydrocodone-acetaminophen]   Review of Systems Review of Systems  Constitutional: Negative for activity change, appetite change, chills, fatigue, fever and unexpected weight change.  HENT: Positive for congestion, postnasal drip and sore throat. Negative for hearing loss.   Eyes: Negative.   Respiratory: Negative.   Cardiovascular: Negative.   Neurological: Negative.    Physical Exam Triage Vital Signs ED Triage Vitals  Enc Vitals Group     BP 08/23/17 1557 126/74     Pulse Rate 08/23/17 1557 69     Resp 08/23/17 1557 18     Temp 08/23/17 1557 98.4 F (36.9 C)     Temp Source 08/23/17 1557 Oral     SpO2 08/23/17 1557 96 %     Weight 08/23/17 1600 180 lb (81.6 kg)     Height 08/23/17 1600 5\' 5"  (1.651 m)     Head Circumference --      Peak Flow --      Pain Score 08/23/17 1600 0     Pain Loc --      Pain Edu? --      Excl. in [place]? --    No data found.  Updated Vital Signs BP 126/74 (BP Location: Right Arm)   Pulse 69   Temp 98.4 F (36.9 C) (Oral)   Resp 18   Ht 5\' 5"  (1.651 m)   Wt 180 lb (81.6 kg)   SpO2 96%   BMI 29.95 kg/m   Visual Acuity Right Eye Distance:   Left Eye Distance:   Bilateral Distance:    Right Eye Near:   Left Eye Near:    Bilateral Near:      Physical Exam  Constitutional: She is oriented to person, place, and time. She appears well-developed and well-nourished.  HENT:  Nose: No sinus tenderness. Right sinus exhibits no maxillary sinus tenderness and no frontal sinus tenderness. Left sinus exhibits no maxillary sinus tenderness and no frontal sinus tenderness.  Mouth/Throat: Uvula is midline and mucous membranes are normal. Posterior oropharyngeal erythema present.  Eyes: Pupils are equal, round, and reactive to light. EOM are normal.  Cardiovascular: Normal rate, regular rhythm, normal heart sounds and intact distal pulses.  Pulmonary/Chest: Effort normal and  breath sounds normal.  Musculoskeletal: Normal range of motion.  Lymphadenopathy:    She has no cervical adenopathy.  Neurological: She is alert and oriented to person, place, and time.  Skin: Skin is warm and dry.   UC Treatments / Results  Labs (all labs ordered are listed, but only abnormal results are displayed) Labs Reviewed - No data to display  EKG None  Radiology No results found.  Procedures Procedures (including critical care time)  Medications Ordered in UC Medications - No data to display  Initial Impression / Assessment and Plan / UC Course  I have reviewed the triage vital signs and the nursing notes.  Pertinent labs & imaging results that were available during my care of the patient were reviewed by me and considered in my medical decision making (see chart for details).  67 year old well-appearing female presents today with symptoms consistent with an acute sinus infection and cough secondary to post nasal drainage. Respiratory exam was unremarkable and no worrisome cough noted during encounter therefore, in my opinion a chest x-ray was not warranted. Will treat with broad-spectrum antibiotic and manage cough with cough suppressant therapy. Strict follow-up precautions given to patient. Final Clinical Impressions(s) / UC Diagnoses   Final  diagnoses:  Acute sinusitis, recurrence not specified, unspecified location   Discharge Instructions   None    ED Prescriptions    Medication Sig Dispense Auth. Provider   azithromycin (ZITHROMAX) 250 MG tablet Take 2 tabs PO x 1 dose, then 1 tab PO QD x 4 days 6 tablet Scot Jun, FNP   fluconazole (DIFLUCAN) 150 MG tablet Take 1 tablet (150 mg total) by mouth once for 1 dose. Repeat if needed up to 2 times. 3 tablet Scot Jun, FNP   benzonatate (TESSALON) 100 MG capsule Take 1-2 capsules (100-200 mg total) by mouth 3 (three) times daily as needed for cough. 40 capsule Scot Jun, FNP     Controlled Substance Prescriptions [place] Controlled Substance Registry consulted? Not Applicable   Scot Jun, FNP 08/23/17 [phone]

## 2017-08-24 DIAGNOSIS — E1140 Type 2 diabetes mellitus with diabetic neuropathy, unspecified: Secondary | ICD-10-CM | POA: Diagnosis not present

## 2017-08-26 ENCOUNTER — Ambulatory Visit (HOSPITAL_BASED_OUTPATIENT_CLINIC_OR_DEPARTMENT_OTHER)
Admission: RE | Admit: 2017-08-26 | Discharge: 2017-08-26 | Disposition: A | Discharge status: Home / Self Care | Payer: Medicare Other | Source: Ambulatory Visit | Attending: Medical | Admitting: Medical

## 2017-08-26 ENCOUNTER — Encounter: Payer: Self-pay | Admitting: Medical

## 2017-08-26 ENCOUNTER — Ambulatory Visit (INDEPENDENT_AMBULATORY_CARE_PROVIDER_SITE_OTHER): Payer: Medicare Other | Admitting: Medical

## 2017-08-26 VITALS — BP 117/60 | HR 72 | Temp 98.2°F | Resp 16 | Ht 65.0 in | Wt 180.0 lb

## 2017-08-26 DIAGNOSIS — R05 Cough: Secondary | ICD-10-CM | POA: Diagnosis not present

## 2017-08-26 DIAGNOSIS — J029 Acute pharyngitis, unspecified: Secondary | ICD-10-CM

## 2017-08-26 DIAGNOSIS — I700 Atherosclerosis of aorta: Secondary | ICD-10-CM | POA: Insufficient documentation

## 2017-08-26 DIAGNOSIS — R059 Cough, unspecified: Secondary | ICD-10-CM

## 2017-08-26 DIAGNOSIS — J301 Allergic rhinitis due to pollen: Secondary | ICD-10-CM | POA: Diagnosis not present

## 2017-08-26 MED ORDER — PREDNISONE 10 MG PO TABS: ORAL_TABLET | ORAL | 0 refills | Status: DC

## 2017-08-26 MED ORDER — AZELASTINE HCL 0.1 % NA SOLN: 2.0000 | Freq: Two times a day (BID) | NASAL | 12 refills | Status: DC

## 2017-08-26 NOTE — Patient Instructions (Addendum)
Your rapid strep test was negative.  The fact that you took azithromycin and still have sore throat makes me doubt strep.  Rather on exam you have a lot of postnasal drainage.  Along with the nasal congestion, I am suspicious that primary cause of sore throat is allergic postnasal drainage rather than bacteria.  I want you to continue on Xyzal and Flonase.  Consider adding Astelin nasal spray..  So decided to prescribe 4-day taper dose of prednisone.  Your diabetic so please make sure you eat low sugar diet over the next 4 days.  Check your sugars daily and notify me if sugars go over 200.  Your recent A1c's have been 6.7 so I think you will see much of increase of your sugar level.  Please get chest x-ray today.  We will follow that and decide if you need another antibiotic.  If your symptoms worsen or change please let us know as well.  If any wheezing occurs please let us know as well.  Continue benzonatate 200 mg for your cough. Can't give other type med due to   Follow-up in 7 days or as needed.

## 2017-08-26 NOTE — Progress Notes (Signed)
Subjective:    Patient ID: Victoria Walker, female    DOB: 04/05/1951, 67 y.o.   MRN: [id number]  HPI   Pt in with recent UC visit. They diagnosed her with sinus infection but on note review her throat was red.(only positive finding on review of UC note)  She feels some chest congestion now. This is new compared to ED visit.   Pt stats cough is keeping her up at night. She thinks severe cough may be causing her st. Pt states tessalon perle is not helping.  Pt states symptoms started on Saturday. Pt can feels pnd. Some sneezing and hx of allergies.  Pt is on xyzal.   She is on nasonex spray.  Maybe some wheeze on Sunday night. No repetitive type wheezing described.  Pt took benzonatate 200 mg and was able to sleep.  Nasal congested with pnd since onset of illness.   Review of Systems  Constitutional: Negative for chills and fatigue.  HENT: Positive for congestion and postnasal drip. Negative for ear pain and facial swelling.   Respiratory: Positive for cough and wheezing. Negative for chest tightness and shortness of breath.        Maybe wheeze on weekend.  Cardiovascular: Negative for chest pain and palpitations.  Gastrointestinal: Negative for abdominal pain and anal bleeding.  Musculoskeletal: Negative for back pain.  Neurological: Negative for dizziness, seizures, syncope and headaches.  Hematological: Negative for adenopathy. Does not bruise/bleed easily.  Psychiatric/Behavioral: Negative for behavioral problems, confusion and hallucinations.    Past Medical History:  Diagnosis Date  . Arthritis    "both knees" (07/02/2015)  . Cancer of right breast ([facility])   . Claustrophobia   . Dysrhythmia    HX OF FAST HEART RATE AND PALPITATIONS - METOPROLOL HAS HELPED  . GERD (gastroesophageal reflux disease)   . H/O hiatal hernia   . H/O iritis    LEFT EYE - STATES HER EYE BECOMES RED AND VERY SENSITIVE TO LIGHT WHEN FLARE UP OF IRITIS  . Heart murmur    "benign;  06/2015"  . High cholesterol   . History of blood transfusion    "when I had my partial hysterectomy"  . History of chickenpox 09/26/2014  . History of measles   . History of MRSA infection   . History of shingles   . Hypertension   . Hypothyroidism   . Medicare annual wellness visit, initial 04/07/2015  . Multinodular thyroid    PT HAVING TROUBLE SWALLOWING  . Neuropathy    FEET  . Primary localized osteoarthritis of right knee   . Sciatica of right side 04/25/2015  . Spinal headache    spinal headache with epidural  . Type II diabetes mellitus ([facility])    ORAL MEDICATION - NO INSULIN  . Wears glasses      Social History   Socioeconomic History  . Marital status: Married    Spouse name: Not on file  . Number of children: 3  . Years of education: Not on file  . Highest education level: Not on file  Occupational History    Comment: Retired  Scientific laboratory technician  . Financial resource strain: Not on file  . Food insecurity:    Worry: Not on file    Inability: Not on file  . Transportation needs:    Medical: Not on file    Non-medical: Not on file  Tobacco Use  . Smoking status: Never Smoker  . Smokeless tobacco: Never Used  Substance and Sexual Activity  .  Alcohol use: No    Alcohol/week: 0.0 oz  . Drug use: No  . Sexual activity: Yes    Birth control/protection: Surgical    Comment: lives with husband, retired from various jobs, diabetic diet  Lifestyle  . Physical activity:    Days per week: Not on file    Minutes per session: Not on file  . Stress: Not on file  Relationships  . Social connections:    Talks on phone: Not on file    Gets together: Not on file    Attends religious service: Not on file    Active member of club or organization: Not on file    Attends meetings of clubs or organizations: Not on file    Relationship status: Not on file  . Intimate partner violence:    Fear of current or ex partner: Not on file    Emotionally abused: Not on file     Physically abused: Not on file    Forced sexual activity: Not on file  Other Topics Concern  . Not on file  Social History Narrative  . Not on file    Past Surgical History:  Procedure Laterality Date  . ABDOMINAL HYSTERECTOMY  1988  . ABDOMINOPLASTY  1986  . ANTERIOR AND POSTERIOR REPAIR  05/11/2012   Procedure: ANTERIOR (CYSTOCELE) AND POSTERIOR REPAIR (RECTOCELE);  Surgeon: Reece Packer, MD;  Location: [facility];  Service: Urology;;  with graft  . BREAST BIOPSY Right   . CARDIAC CATHETERIZATION  1999  . COLONOSCOPY    . CYSTOSCOPY WITH URETHRAL DILATATION  05/11/2012   Procedure: CYSTOSCOPY WITH URETHRAL DILATATION;  Surgeon: Reece Packer, MD;  Location: [facility];  Service: Urology;;  . ESOPHAGOGASTRODUODENOSCOPY    . HERNIA REPAIR    . MASTECTOMY Right 2007  . PLANTAR FASCIA SURGERY Left   . THYROIDECTOMY N/A 02/23/2014   Procedure: TOTAL THYROIDECTOMY;  Surgeon: Armandina Gemma, MD;  Location: [facility];  Service: General;  Laterality: N/A;  . TOTAL KNEE ARTHROPLASTY Right 07/02/2015  . TOTAL KNEE ARTHROPLASTY Right 07/02/2015   Procedure: TOTAL KNEE ARTHROPLASTY;  Surgeon: Elsie Saas, MD;  Location: [place];  Service: Orthopedics;  Laterality: Right;  . TOTAL THYROIDECTOMY  1976   removed three tumor (2 on right; 1 on left)  . TUBAL LIGATION    . UMBILICAL HERNIA REPAIR  1986  . VAGINAL PROLAPSE REPAIR  05/11/2012   Procedure: VAGINAL VAULT SUSPENSION;  Surgeon: Reece Packer, MD;  Location: [facility];  Service: Urology;;  . VULVA / PERINEUM BIOPSY     vulvar    Family History  Problem Relation Age of Onset  . Hypertension Mother   . Heart disease Mother        pacer, CHF  . Hyperlipidemia Mother   . Kidney disease Mother        1 kidney due to stone  . Kidney Stones Mother   . Hypertension Father   . Cancer Father        lung, psa  . Diabetes Father   . Alcohol abuse Father   . Diabetes Sister   . Cancer Sister   . Hypertension Sister   . Diabetes Brother     . Hypertension Brother   . Hyperlipidemia Brother   . Kidney disease Brother        dialysis  . Stroke Brother   . Hypertension Maternal Grandmother   . Heart disease Maternal Grandmother   . Stroke Maternal Grandmother   .  Hypertension Maternal Grandfather   . Hypertension Daughter   . Arthritis Son   . Hyperlipidemia Sister   . Hypertension Sister   . Diabetes Sister   . Thyroid disease Sister   . Kidney disease Sister   . Cancer Sister        thyroid and breast  . Arthritis Sister        back pain  . Hypertension Sister   . Thyroid disease Sister   . Hypertension Brother   . Hyperlipidemia Brother   . Hypertension Brother   . Hyperlipidemia Brother   . Benign prostatic hyperplasia Brother   . Hypertension Daughter   . Hyperlipidemia Daughter   . Diabetes Daughter   . Hypertension Maternal Aunt   . Hypertension Maternal Uncle   . Hypertension Paternal Aunt   . Cancer Paternal Aunt   . Hypertension Paternal Uncle     Allergies  Allergen Reactions  . Codeine Anxiety    Jittery   . Penicillins Itching  . Sulfa Antibiotics Itching  . Vicodin [Hydrocodone-Acetaminophen] Itching    ITCHING     Current Outpatient Medications on File Prior to Visit  Medication Sig Dispense Refill  . aspirin 81 MG EC tablet Take 81 mg by mouth every other day. Swallow whole.    Marland Kitchen atorvastatin (LIPITOR) 40 MG tablet TAKE 1/2 TABLET ON MONDAY/WEDNESDAY/THURSDAY/FRIDAY AND SUNDAY AND 1 TAB ON TUESDAY/SATURDAY 58 tablet 3  . AVAPRO 150 MG tablet TAKE 1/2 TO 1 (ONE-HALF TO ONE) TABLET BY MOUTH ONCE DAILY 90 tablet 2  . azithromycin (ZITHROMAX) 250 MG tablet Take 2 tabs PO x 1 dose, then 1 tab PO QD x 4 days 6 tablet 0  . benzonatate (TESSALON) 100 MG capsule Take 1-2 capsules (100-200 mg total) by mouth 3 (three) times daily as needed for cough. 40 capsule 1  . calcium-vitamin D (OSCAL WITH D) 250-125 MG-UNIT per tablet Take 1 tablet by mouth daily.     . Cholecalciferol (VITAMIN D) 2000  UNITS CAPS Take 1 capsule by mouth every evening.     . Ciclopirox 1 % shampoo Apply 1 each topically every Friday.     . cycloSPORINE (RESTASIS) 0.05 % ophthalmic emulsion Place 1 drop into both eyes 2 (two) times daily.    . diclofenac sodium (VOLTAREN) 1 % GEL APPLY 2-4 GRAM TO AFFECTED AREA 4 TIMES A DAY AS NEEDED  1  . diltiazem (TIAZAC) 360 MG 24 hr capsule TAKE 1 CAPSULE (360 MG TOTAL) BY MOUTH EVERY MORNING. 90 capsule 3  . DOCOSAHEXAENOIC ACID PO Take by mouth.    . Estradiol 10 MCG TABS vaginal tablet 1 tablet per vagina at bedtime QOD 30 tablet 12  . gabapentin (NEURONTIN) 300 MG capsule TAKE 1 CAPSULE (300 MG TOTAL) BY MOUTH AT BEDTIME. 270 capsule 0  . glucose blood (ONE TOUCH TEST STRIPS) test strip Use as directed twice daily to check blood sugar. DX code E11.9 300 each 1  . levocetirizine (XYZAL) 5 MG tablet TAKE 1 TABLET (5 MG TOTAL) BY MOUTH EVERY EVENING FOR RUNNY NOSE OR ITCHING 90 tablet 2  . levothyroxine (SYNTHROID, LEVOTHROID) 75 MCG tablet TAKE 1 TABLET BY MOUTH MONDAY THROUHG FRIDAY & 1/2 TAB ON SATURDAY & SUNDAY 90 tablet 0  . loteprednol (LOTEMAX) 0.5 % ophthalmic suspension Place 1 drop into the left eye daily as needed ("arthiritis of the eye").     . metFORMIN (GLUCOPHAGE-XR) 750 MG 24 hr tablet TAKE 1 TABLET IN AM AND 1 TABLET AT  LUNCH 180 tablet 1  . metoprolol succinate (TOPROL-XL) 25 MG 24 hr tablet TAKE 1 TABLET BY MOUTH TWICE A DAY AT 12 NOON AND 4PM 180 tablet 1  . mometasone (NASONEX) 50 MCG/ACT nasal spray Two sprays each nostril once a day if needed for nasal congestion or drainage. 51 g 3  . NEXIUM 40 MG capsule TAKE 1 CAPSULE BY MOUTH IN THE MORNING 90 capsule 2  . Olopatadine HCl 0.2 % SOLN Place 1 drop into both eyes daily as needed.  11  . polyvinyl alcohol-povidone (REFRESH) 1.4-0.6 % ophthalmic solution Place 1 drop into both eyes 2 (two) times daily.    . Probiotic Product (PROBIOTIC DAILY PO) Take 1 capsule by mouth daily.    Marland Kitchen spironolactone  (ALDACTONE) 25 MG tablet TAEK 1 & 1/2 TAB BY MOUTH EVERY DAY 180 tablet 0  . UNABLE TO FIND Vitamin E Vaginal Suppository     No current facility-administered medications on file prior to visit.     BP 117/60 (BP Location: Left Arm, Patient Position: Sitting, Cuff Size: Large)   Pulse 72   Temp 98.2 F (36.8 C) (Oral)   Resp 16   Ht 5\' 5"  (1.651 m)   Wt 180 lb (81.6 kg)   SpO2 99%   BMI 29.95 kg/m       Objective:   Physical Exam  General  Mental Status - Alert. General Appearance - Well groomed. Not in acute distress.  Skin Rashes- No Rashes.  HEENT Head- Normal. Ear Auditory Canal - Left- Normal. Right - Normal.Tympanic Membrane- Left- Normal. Right- Normal. Eye Sclera/Conjunctiva- Left- Normal. Right- Normal. Nose & Sinuses Nasal Mucosa- Left-  Boggy and Congested. Right-  Boggy and  Congested.Bilateral no maxillary and  No frontal sinus pressure. Mouth & Throat Lips: Upper Lip- Normal: no dryness, cracking, pallor, cyanosis, or vesicular eruption. Lower Lip-Normal: no dryness, cracking, pallor, cyanosis or vesicular eruption. Buccal Mucosa- Bilateral- No Aphthous ulcers. Oropharynx- No Discharge or Erythema. +pnd. Tonsils: Characteristics- Bilateral- faint  Erythema . Size/Enlargement- Bilateral- No enlargement. Discharge- bilateral-None.  Neck Neck- Supple. No Masses.   Chest and Lung Exam Auscultation: Breath Sounds:-Clear even and unlabored.  Cardiovascular Auscultation:Rythm- Regular, rate and rhythm. Murmurs & Other Heart Sounds:Ausculatation of the heart reveal- No Murmurs.  Lymphatic Head & Neck General Head & Neck Lymphatics: Bilateral: Description- No Localized lymphadenopathy. .          Assessment & Plan:  Your rapid strep test was negative.  The fact that you took azithromycin and still have sore throat makes me doubt strep.  Rather on exam you have a lot of postnasal drainage.  Along with the nasal congestion, I am suspicious that  primary cause of sore throat is allergic postnasal drainage rather than bacteria.  I want you to continue on Xyzal and Flonase.  Consider adding Astelin nasal spray.  So decided to prescribe 4-day taper dose of prednisone.  Your diabetic so please make sure you eat low sugar diet over the next 4 days.  Check your sugars daily and notify me if sugars go over 200.  Your recent A1c's have been 6.7 so I think you will see much of increase of your sugar level.  Please get chest x-ray today.  We will follow that and decide if you need another antibiotic.  If your symptoms worsen or change please let us know as well.  If any wheezing occurs please let us know as well.  Follow-up in 7 days or as needed.  Mackie Pai, PA-C

## 2017-08-28 ENCOUNTER — Other Ambulatory Visit: Payer: Self-pay | Admitting: Family Medicine

## 2017-08-28 ENCOUNTER — Telehealth: Payer: Self-pay | Admitting: *Deleted

## 2017-08-28 NOTE — Telephone Encounter (Signed)
Received Physician Orders from [facility], this is the 8th time and note was placed on return order to identify why we keep receiving this for future reference; forwarded to provider/SLS 05/10

## 2017-09-01 ENCOUNTER — Telehealth: Payer: Self-pay | Admitting: Family Medicine

## 2017-09-01 NOTE — Telephone Encounter (Signed)
Copied from [place] [id number]. Topic: Quick Communication - See Telephone Encounter >> Sep 01, 2017  2:20 PM Cleaster Corin, [place] wrote: CRM for notification. See Telephone encounter for: 09/01/17.  Pt. Has taken all of the zpack and stated that she is still congestion and would like something else called in contact by cell phone [phone]   [id number] - [place], [address] [place] [address] Phone: [phone] Fax: [phone]

## 2017-09-01 NOTE — Telephone Encounter (Signed)
Sounds like there might be an allergic component. Should take Zyrtec and flonase OTC for a week and add Mucinex 600 mg bid and add prescription Singulair 10 mg daily disp #30 with two refill and if no relief needs to come in for evaluation.

## 2017-09-01 NOTE — Telephone Encounter (Signed)
Please advise 

## 2017-09-04 NOTE — Telephone Encounter (Signed)
Called patient unable to leave voicemail  

## 2017-09-15 DIAGNOSIS — L218 Other seborrheic dermatitis: Secondary | ICD-10-CM | POA: Diagnosis not present

## 2017-09-15 DIAGNOSIS — L811 Chloasma: Secondary | ICD-10-CM | POA: Diagnosis not present

## 2017-09-25 ENCOUNTER — Telehealth: Payer: Self-pay | Admitting: *Deleted

## 2017-09-25 NOTE — Telephone Encounter (Signed)
Received Physician Orders from [facility] for diabetic testing supplies; forwarded to provider/SLS 06/07

## 2017-09-30 DIAGNOSIS — C50911 Malignant neoplasm of unspecified site of right female breast: Secondary | ICD-10-CM | POA: Diagnosis not present

## 2017-10-07 ENCOUNTER — Telehealth: Payer: Self-pay | Admitting: *Deleted

## 2017-10-07 NOTE — Telephone Encounter (Signed)
[facility]; forwarded to provider/SLS 06/19

## 2017-10-08 ENCOUNTER — Other Ambulatory Visit: Payer: Medicare Other

## 2017-10-09 DIAGNOSIS — C50911 Malignant neoplasm of unspecified site of right female breast: Secondary | ICD-10-CM | POA: Diagnosis not present

## 2017-10-12 DIAGNOSIS — C50911 Malignant neoplasm of unspecified site of right female breast: Secondary | ICD-10-CM | POA: Diagnosis not present

## 2017-10-15 ENCOUNTER — Encounter: Payer: Medicare Other | Admitting: Family Medicine

## 2017-10-19 ENCOUNTER — Telehealth: Payer: Self-pay | Admitting: Family Medicine

## 2017-10-19 ENCOUNTER — Other Ambulatory Visit: Payer: Self-pay | Admitting: Family Medicine

## 2017-10-19 DIAGNOSIS — E039 Hypothyroidism, unspecified: Secondary | ICD-10-CM

## 2017-10-19 DIAGNOSIS — E118 Type 2 diabetes mellitus with unspecified complications: Secondary | ICD-10-CM

## 2017-10-19 DIAGNOSIS — R350 Frequency of micturition: Secondary | ICD-10-CM

## 2017-10-19 DIAGNOSIS — E782 Mixed hyperlipidemia: Secondary | ICD-10-CM

## 2017-10-19 DIAGNOSIS — I10 Essential (primary) hypertension: Secondary | ICD-10-CM

## 2017-10-19 NOTE — Telephone Encounter (Signed)
thanks

## 2017-10-19 NOTE — Telephone Encounter (Signed)
Copied from [place] [id number]. Topic: Quick Communication - See Telephone Encounter >> Oct 19, 2017  9:11 AM Bea Graff, [place] wrote: CRM for notification. See Telephone encounter for: 10/19/17. Pt would like to see if a urinalysis can be ordered with her labs for tomorrow as well for urine frequency.

## 2017-10-19 NOTE — Telephone Encounter (Signed)
Orders placed.

## 2017-10-19 NOTE — Telephone Encounter (Signed)
Pt. Has 7/2 lab appointment, but no labs ordered for pending physical with Dr. Charlett Blake 7/9. Pt. Requesting UA to be added onto routine labwork. Routed to Dr. Charlett Blake to order.

## 2017-10-20 ENCOUNTER — Other Ambulatory Visit (INDEPENDENT_AMBULATORY_CARE_PROVIDER_SITE_OTHER): Payer: Medicare Other

## 2017-10-20 ENCOUNTER — Other Ambulatory Visit (HOSPITAL_COMMUNITY)
Admission: RE | Admit: 2017-10-20 | Discharge: 2017-10-20 | Disposition: A | Discharge status: Home / Self Care | Payer: Medicare Other | Source: Ambulatory Visit | Attending: Obstetrics & Gynecology | Admitting: Obstetrics & Gynecology

## 2017-10-20 ENCOUNTER — Ambulatory Visit: Payer: Medicare Other

## 2017-10-20 VITALS — BP 131/80 | HR 64

## 2017-10-20 DIAGNOSIS — R399 Unspecified symptoms and signs involving the genitourinary system: Secondary | ICD-10-CM | POA: Insufficient documentation

## 2017-10-20 DIAGNOSIS — E118 Type 2 diabetes mellitus with unspecified complications: Secondary | ICD-10-CM

## 2017-10-20 DIAGNOSIS — I10 Essential (primary) hypertension: Secondary | ICD-10-CM | POA: Diagnosis not present

## 2017-10-20 DIAGNOSIS — N898 Other specified noninflammatory disorders of vagina: Secondary | ICD-10-CM

## 2017-10-20 DIAGNOSIS — E782 Mixed hyperlipidemia: Secondary | ICD-10-CM

## 2017-10-20 DIAGNOSIS — R350 Frequency of micturition: Secondary | ICD-10-CM

## 2017-10-20 DIAGNOSIS — E039 Hypothyroidism, unspecified: Secondary | ICD-10-CM

## 2017-10-20 LAB — COMPREHENSIVE METABOLIC PANEL
ALT: 20 U/L (ref 0–35)
AST: 17 U/L (ref 0–37)
Albumin: 4.5 g/dL (ref 3.5–5.2)
Alkaline Phosphatase: 59 U/L (ref 39–117)
BUN: 17 mg/dL (ref 6–23)
CO2: 31 mEq/L (ref 19–32)
Calcium: 9.7 mg/dL (ref 8.4–10.5)
Chloride: 102 mEq/L (ref 96–112)
Creatinine, Ser: 0.93 mg/dL (ref 0.40–1.20)
GFR: 77.12 mL/min (ref >60.00)
Glucose, Bld: 105 mg/dL — ABNORMAL HIGH (ref 70–99)
Potassium: 4.8 mEq/L (ref 3.5–5.1)
Sodium: 140 mEq/L (ref 135–145)
Total Bilirubin: 0.6 mg/dL (ref 0.2–1.2)
Total Protein: 6.6 g/dL (ref 6.0–8.3)

## 2017-10-20 LAB — URINALYSIS
Bilirubin Urine: NEGATIVE
Hgb urine dipstick: NEGATIVE
Ketones, ur: NEGATIVE
Leukocytes, UA: NEGATIVE
Nitrite: NEGATIVE
Specific Gravity, Urine: 1.02 (ref 1.000–1.030)
Total Protein, Urine: NEGATIVE
Urine Glucose: NEGATIVE
Urobilinogen, UA: 0.2 (ref 0.0–1.0)
pH: 6 (ref 5.0–8.0)

## 2017-10-20 LAB — LIPID PANEL
Cholesterol: 187 mg/dL (ref 0–200)
HDL: 41.4 mg/dL (ref >39.00)
LDL Cholesterol: 118 mg/dL — ABNORMAL HIGH (ref 0–99)
NonHDL: 145.31
Total CHOL/HDL Ratio: 5
Triglycerides: 135 mg/dL (ref 0.0–149.0)
VLDL: 27 mg/dL (ref 0.0–40.0)

## 2017-10-20 LAB — TSH: TSH: 4.39 u[IU]/mL (ref 0.35–4.50)

## 2017-10-20 LAB — CBC
HCT: 38.1 % (ref 36.0–46.0)
Hemoglobin: 12.5 g/dL (ref 12.0–15.0)
MCHC: 32.8 g/dL (ref 30.0–36.0)
MCV: 93.4 fl (ref 78.0–100.0)
Platelets: 268 10*3/uL (ref 150.0–400.0)
RBC: 4.08 Mil/uL (ref 3.87–5.11)
RDW: 13.7 % (ref 11.5–15.5)
WBC: 6.1 10*3/uL (ref 4.0–10.5)

## 2017-10-20 LAB — HEMOGLOBIN A1C: Hgb A1c MFr Bld: 6.8 % — ABNORMAL HIGH (ref 4.6–6.5)

## 2017-10-20 NOTE — Progress Notes (Signed)
Pt presents to the office with vaginal discharge and pain with urination x 1 week Pt self swabbed and left a urine sample. Specimen was sent to the lab and urine sample was sent off to be cultured. Pt voiced understanding.

## 2017-10-21 DIAGNOSIS — Z853 Personal history of malignant neoplasm of breast: Secondary | ICD-10-CM | POA: Diagnosis not present

## 2017-10-21 DIAGNOSIS — Z1231 Encounter for screening mammogram for malignant neoplasm of breast: Secondary | ICD-10-CM | POA: Diagnosis not present

## 2017-10-21 LAB — URINE CULTURE
MICRO NUMBER:: 90787410
SPECIMEN QUALITY:: ADEQUATE

## 2017-10-21 LAB — CERVICOVAGINAL ANCILLARY ONLY
Bacterial vaginitis: NEGATIVE
Candida vaginitis: NEGATIVE

## 2017-10-21 LAB — HM MAMMOGRAPHY

## 2017-10-23 ENCOUNTER — Telehealth: Payer: Self-pay | Admitting: Family Medicine

## 2017-10-23 NOTE — Telephone Encounter (Signed)
Copied from [place] [phone]. Topic: Quick Communication - See Telephone Encounter >> Oct 23, 2017 12:19 PM Neva Seat wrote: Pt calling to checking back to see what the results of her urine testing.

## 2017-10-23 NOTE — Telephone Encounter (Signed)
Author returned pt's call to relay recent urine testing per pt. Request. Author left detailed VM with return call back number if any questions [phone].

## 2017-10-26 DIAGNOSIS — H401130 Primary open-angle glaucoma, bilateral, stage unspecified: Secondary | ICD-10-CM | POA: Diagnosis not present

## 2017-10-27 ENCOUNTER — Encounter: Payer: Self-pay | Admitting: Family Medicine

## 2017-10-27 ENCOUNTER — Telehealth: Payer: Self-pay

## 2017-10-27 ENCOUNTER — Ambulatory Visit (INDEPENDENT_AMBULATORY_CARE_PROVIDER_SITE_OTHER): Payer: Medicare Other | Admitting: Family Medicine

## 2017-10-27 ENCOUNTER — Ambulatory Visit (HOSPITAL_BASED_OUTPATIENT_CLINIC_OR_DEPARTMENT_OTHER)
Admission: RE | Admit: 2017-10-27 | Discharge: 2017-10-27 | Disposition: A | Discharge status: Home / Self Care | Payer: Medicare Other | Source: Ambulatory Visit | Attending: Family Medicine | Admitting: Family Medicine

## 2017-10-27 VITALS — BP 109/71 | HR 96 | Temp 98.1°F | Resp 18 | Ht 65.0 in | Wt 179.6 lb

## 2017-10-27 DIAGNOSIS — M1712 Unilateral primary osteoarthritis, left knee: Secondary | ICD-10-CM | POA: Diagnosis not present

## 2017-10-27 DIAGNOSIS — I10 Essential (primary) hypertension: Secondary | ICD-10-CM | POA: Diagnosis not present

## 2017-10-27 DIAGNOSIS — G8929 Other chronic pain: Secondary | ICD-10-CM

## 2017-10-27 DIAGNOSIS — Z23 Encounter for immunization: Secondary | ICD-10-CM

## 2017-10-27 DIAGNOSIS — M25562 Pain in left knee: Secondary | ICD-10-CM

## 2017-10-27 DIAGNOSIS — Z0000 Encounter for general adult medical examination without abnormal findings: Secondary | ICD-10-CM

## 2017-10-27 DIAGNOSIS — E785 Hyperlipidemia, unspecified: Secondary | ICD-10-CM

## 2017-10-27 DIAGNOSIS — E782 Mixed hyperlipidemia: Secondary | ICD-10-CM

## 2017-10-27 DIAGNOSIS — G629 Polyneuropathy, unspecified: Secondary | ICD-10-CM

## 2017-10-27 DIAGNOSIS — M8580 Other specified disorders of bone density and structure, unspecified site: Secondary | ICD-10-CM | POA: Diagnosis not present

## 2017-10-27 DIAGNOSIS — E118 Type 2 diabetes mellitus with unspecified complications: Secondary | ICD-10-CM | POA: Diagnosis not present

## 2017-10-27 MED ORDER — ATORVASTATIN CALCIUM 40 MG PO TABS: ORAL_TABLET | ORAL | 3 refills | Status: DC

## 2017-10-27 NOTE — Assessment & Plan Note (Signed)
Encouraged to get adequate exercise, calcium and vitamin d intake 

## 2017-10-27 NOTE — Assessment & Plan Note (Addendum)
Patient encouraged to maintain heart healthy diet, regular exercise, adequate sleep. Consider daily probiotics. Given and reviewed copy of ACP documents from [employer] and encouraged to complete and return. Labs reviewed

## 2017-10-27 NOTE — Patient Instructions (Addendum)
Bone density shows osteopenia, which is thinner than normal but not as bad as osteoporosis. Recommend calcium intake of 1200 to 1500 mg daily, divided into roughly 3 doses. Best source is the diet and a single dairy serving is about 500 mg, a supplement of calcium citrate once or twice daily to balance diet is fine if not getting enough in diet. Also need Vitamin D 2000 IU caps, 1 cap daily if not already taking vitamin D. Also recommend weight baring exercise on hips and upper body to keep bones strong Preventive Care 68 Years and Older, Female Preventive care refers to lifestyle choices and visits with your health care provider that can promote health and wellness. What does preventive care include?  A yearly physical exam. This is also called an annual well check.  Dental exams once or twice a year.  Routine eye exams. Ask your health care provider how often you should have your eyes checked.  Personal lifestyle choices, including: ? Daily care of your teeth and gums. ? Regular physical activity. ? Eating a healthy diet. ? Avoiding tobacco and drug use. ? Limiting alcohol use. ? Practicing safe sex. ? Taking low-dose aspirin every day. ? Taking vitamin and mineral supplements as recommended by your health care provider. What happens during an annual well check? The services and screenings done by your health care provider during your annual well check will depend on your age, overall health, lifestyle risk factors, and family history of disease. Counseling Your health care provider may ask you questions about your:  Alcohol use.  Tobacco use.  Drug use.  Emotional well-being.  Home and relationship well-being.  Sexual activity.  Eating habits.  History of falls.  Memory and ability to understand (cognition).  Work and work environment.  Reproductive health.  Screening You may have the following tests or measurements:  Height, weight, and BMI.  Blood  pressure.  Lipid and cholesterol levels. These may be checked every 5 years, or more frequently if you are over 50 years old.  Skin check.  Lung cancer screening. You may have this screening every year starting at age 55 if you have a 30-pack-year history of smoking and currently smoke or have quit within the past 15 years.  Fecal occult blood test (FOBT) of the stool. You may have this test every year starting at age 50.  Flexible sigmoidoscopy or colonoscopy. You may have a sigmoidoscopy every 5 years or a colonoscopy every 10 years starting at age 50.  Hepatitis C blood test.  Hepatitis B blood test.  Sexually transmitted disease (STD) testing.  Diabetes screening. This is done by checking your blood sugar (glucose) after you have not eaten for a while (fasting). You may have this done every 1-3 years.  Bone density scan. This is done to screen for osteoporosis. You may have this done starting at age 68.  Mammogram. This may be done every 1-2 years. Talk to your health care provider about how often you should have regular mammograms.  Talk with your health care provider about your test results, treatment options, and if necessary, the need for more tests. Vaccines Your health care provider may recommend certain vaccines, such as:  Influenza vaccine. This is recommended every year.  Tetanus, diphtheria, and acellular pertussis (Tdap, Td) vaccine. You may need a Td booster every 10 years.  Varicella vaccine. You may need this if you have not been vaccinated.  Zoster vaccine. You may need this after age 60.  Measles, mumps,   and rubella (MMR) vaccine. You may need at least one dose of MMR if you were born in 1957 or later. You may also need a second dose.  Pneumococcal 13-valent conjugate (PCV13) vaccine. One dose is recommended after age 68.  Pneumococcal polysaccharide (PPSV23) vaccine. One dose is recommended after age 68.  Meningococcal vaccine. You may need this if you  have certain conditions.  Hepatitis A vaccine. You may need this if you have certain conditions or if you travel or work in places where you may be exposed to hepatitis A.  Hepatitis B vaccine. You may need this if you have certain conditions or if you travel or work in places where you may be exposed to hepatitis B.  Haemophilus influenzae type b (Hib) vaccine. You may need this if you have certain conditions.  Talk to your health care provider about which screenings and vaccines you need and how often you need them. This information is not intended to replace advice given to you by your health care provider. Make sure you discuss any questions you have with your health care provider. Document Released: 05/04/2015 Document Revised: 12/26/2015 Document Reviewed: 02/06/2015 Elsevier Interactive Patient Education  2018 [employer] Inc.  

## 2017-10-27 NOTE — Telephone Encounter (Signed)
Patient called and made aware that vaginal swab was negative for BV and yeast. Kathrene Alu RN

## 2017-10-27 NOTE — Assessment & Plan Note (Signed)
Gabapentin 300 mg at bedtime is helping

## 2017-10-27 NOTE — Progress Notes (Signed)
Subjective:  I acted as a Education administrator for Dr. Charlett Blake. [place], [place]  Patient ID: Victoria Walker, female    DOB: 1950/11/11, 67 y.o.   MRN: [id number]  No chief complaint on file.   HPI  Patient is in today for an annual exam and follow up on chronic medical concerns including hypertension, hyperlipidemia, peripheral neuropathy, diabetes and more. She had a Dexa scan in 02/2017 which showed osteopenia, is trying to get vitamin D and 3 servings of calcium daily. Trying to stay active but her chronic left knee pain and peripheral neuropathy are hampering her ability to ambulate and stay active. She is trying to maintain a low carbohydrate and heart healthy diet. Patient is noting she continues to manage her activities of daily living well. No recent hospitalizations or febrile illness. Denies CP/palp/SOB/HA/congestion/fevers/GI or GU c/o. Taking meds as prescribed  Patient Care Team: Mosie Lukes, MD as PCP - General (Family Medicine) Juanita Craver, MD as Consulting Physician (Gastroenterology) [place], Lois Huxley, MD (Ophthalmology) Camelia Phenes, DPM as Consulting Physician (Podiatry) Iona Beard, OD as Referring Physician (Optometry) Charlies Silvers, MD as Consulting Physician (Allergy and Immunology) Lavonia Drafts, MD as Consulting Physician (Obstetrics and Gynecology) Elsie Saas, MD as Consulting Physician (Orthopedic Surgery)   Past Medical History:  Diagnosis Date  . Arthritis    "both knees" (07/02/2015)  . Cancer of right breast ([place])   . Claustrophobia   . Dysrhythmia    HX OF FAST HEART RATE AND PALPITATIONS - METOPROLOL HAS HELPED  . GERD (gastroesophageal reflux disease)   . H/O hiatal hernia   . H/O iritis    LEFT EYE - STATES HER EYE BECOMES RED AND VERY SENSITIVE TO LIGHT WHEN FLARE UP OF IRITIS  . Heart murmur    "benign; 06/2015"  . High cholesterol   . History of blood transfusion    "when I had my partial hysterectomy"  . History of  chickenpox 09/26/2014  . History of measles   . History of MRSA infection   . History of shingles   . Hypertension   . Hypothyroidism   . Medicare annual wellness visit, initial 04/07/2015  . Multinodular thyroid    PT HAVING TROUBLE SWALLOWING  . Neuropathy    FEET  . Primary localized osteoarthritis of right knee   . Sciatica of right side 04/25/2015  . Spinal headache    spinal headache with epidural  . Type II diabetes mellitus (HCC)    ORAL MEDICATION - NO INSULIN  . Wears glasses     Past Surgical History:  Procedure Laterality Date  . ABDOMINAL HYSTERECTOMY  1988  . ABDOMINOPLASTY  1986  . ANTERIOR AND POSTERIOR REPAIR  05/11/2012   Procedure: ANTERIOR (CYSTOCELE) AND POSTERIOR REPAIR (RECTOCELE);  Surgeon: Reece Packer, MD;  Location: [facility];  Service: Urology;;  with graft  . BREAST BIOPSY Right   . CARDIAC CATHETERIZATION  1999  . COLONOSCOPY    . CYSTOSCOPY WITH URETHRAL DILATATION  05/11/2012   Procedure: CYSTOSCOPY WITH URETHRAL DILATATION;  Surgeon: Reece Packer, MD;  Location: [facility];  Service: Urology;;  . ESOPHAGOGASTRODUODENOSCOPY    . HERNIA REPAIR    . MASTECTOMY Right 2007  . PLANTAR FASCIA SURGERY Left   . THYROIDECTOMY N/A 02/23/2014   Procedure: TOTAL THYROIDECTOMY;  Surgeon: Armandina Gemma, MD;  Location: [facility];  Service: General;  Laterality: N/A;  . TOTAL KNEE ARTHROPLASTY Right 07/02/2015  . TOTAL KNEE ARTHROPLASTY Right 07/02/2015   Procedure:  TOTAL KNEE ARTHROPLASTY;  Surgeon: Elsie Saas, MD;  Location: [facility];  Service: Orthopedics;  Laterality: Right;  . TOTAL THYROIDECTOMY  1976   removed three tumor (2 on right; 1 on left)  . TUBAL LIGATION    . UMBILICAL HERNIA REPAIR  1986  . VAGINAL PROLAPSE REPAIR  05/11/2012   Procedure: VAGINAL VAULT SUSPENSION;  Surgeon: Reece Packer, MD;  Location: [facility];  Service: Urology;;  . VULVA / PERINEUM BIOPSY     vulvar    Family History  Problem Relation Age of Onset  . Hypertension  Mother   . Heart disease Mother        pacer, CHF  . Hyperlipidemia Mother   . Kidney disease Mother        1 kidney due to stone  . Kidney Stones Mother   . Hypertension Father   . Cancer Father        lung, psa  . Diabetes Father   . Alcohol abuse Father   . Diabetes Sister   . Cancer Sister   . Hypertension Sister   . Diabetes Brother   . Hypertension Brother   . Hyperlipidemia Brother   . Kidney disease Brother        dialysis  . Stroke Brother   . Hypertension Maternal Grandmother   . Heart disease Maternal Grandmother   . Stroke Maternal Grandmother   . Hypertension Maternal Grandfather   . Hypertension Daughter   . Arthritis Son   . Hyperlipidemia Sister   . Hypertension Sister   . Diabetes Sister   . Thyroid disease Sister   . Kidney disease Sister   . Cancer Sister        thyroid and breast  . Arthritis Sister        back pain  . Hypertension Sister   . Thyroid disease Sister   . Hypertension Brother   . Hyperlipidemia Brother   . Hypertension Brother   . Hyperlipidemia Brother   . Benign prostatic hyperplasia Brother   . Hypertension Daughter   . Hyperlipidemia Daughter   . Diabetes Daughter   . Hypertension Maternal Aunt   . Hypertension Maternal Uncle   . Hypertension Paternal Aunt   . Cancer Paternal Aunt   . Hypertension Paternal Uncle     Social History   Socioeconomic History  . Marital status: Married    Spouse name: Not on file  . Number of children: 3  . Years of education: Not on file  . Highest education level: Not on file  Occupational History    Comment: Retired  Scientific laboratory technician  . Financial resource strain: Not on file  . Food insecurity:    Worry: Not on file    Inability: Not on file  . Transportation needs:    Medical: Not on file    Non-medical: Not on file  Tobacco Use  . Smoking status: Never Smoker  . Smokeless tobacco: Never Used  Substance and Sexual Activity  . Alcohol use: No    Alcohol/week: 0.0 oz  . Drug  use: No  . Sexual activity: Yes    Birth control/protection: Surgical    Comment: lives with husband, retired from various jobs, diabetic diet  Lifestyle  . Physical activity:    Days per week: Not on file    Minutes per session: Not on file  . Stress: Not on file  Relationships  . Social connections:    Talks on phone: Not on file  Gets together: Not on file    Attends religious service: Not on file    Active member of club or organization: Not on file    Attends meetings of clubs or organizations: Not on file    Relationship status: Not on file  . Intimate partner violence:    Fear of current or ex partner: Not on file    Emotionally abused: Not on file    Physically abused: Not on file    Forced sexual activity: Not on file  Other Topics Concern  . Not on file  Social History Narrative  . Not on file    Outpatient Medications Prior to Visit  Medication Sig Dispense Refill  . aspirin 81 MG EC tablet Take 81 mg by mouth every other day. Swallow whole.    . AVAPRO 150 MG tablet TAKE 1/2 TO 1 (ONE-HALF TO ONE) TABLET BY MOUTH ONCE DAILY 90 tablet 2  . azelastine (ASTELIN) 0.1 % nasal spray Place 2 sprays into both nostrils 2 (two) times daily. Use in each nostril as directed 30 mL 12  . calcium-vitamin D (OSCAL WITH D) 250-125 MG-UNIT per tablet Take 1 tablet by mouth daily.     . Cholecalciferol (VITAMIN D) 2000 UNITS CAPS Take 1 capsule by mouth every evening.     . Ciclopirox 1 % shampoo Apply 1 each topically every Friday.     . cycloSPORINE (RESTASIS) 0.05 % ophthalmic emulsion Place 1 drop into both eyes 2 (two) times daily.    . diclofenac sodium (VOLTAREN) 1 % GEL APPLY 2-4 GRAM TO AFFECTED AREA 4 TIMES A DAY AS NEEDED  1  . diltiazem (TIAZAC) 360 MG 24 hr capsule TAKE 1 CAPSULE (360 MG TOTAL) BY MOUTH EVERY MORNING. 90 capsule 3  . DOCOSAHEXAENOIC ACID PO Take by mouth.    . Estradiol 10 MCG TABS vaginal tablet 1 tablet per vagina at bedtime QOD 30 tablet 12  .  gabapentin (NEURONTIN) 300 MG capsule TAKE 1 CAPSULE (300 MG TOTAL) BY MOUTH AT BEDTIME. 270 capsule 0  . glucose blood (ONE TOUCH TEST STRIPS) test strip Use as directed twice daily to check blood sugar. DX code E11.9 300 each 1  . levocetirizine (XYZAL) 5 MG tablet TAKE 1 TABLET (5 MG TOTAL) BY MOUTH EVERY EVENING FOR RUNNY NOSE OR ITCHING 90 tablet 2  . levothyroxine (SYNTHROID, LEVOTHROID) 75 MCG tablet TAKE ONE TABLET MONDAY THROUGH FRIDAY AND ONE HALF TABLET SATURDAY AND SUNDAY 90 tablet 0  . loteprednol (LOTEMAX) 0.5 % ophthalmic suspension Place 1 drop into the left eye daily as needed ("arthiritis of the eye").     . metFORMIN (GLUCOPHAGE-XR) 750 MG 24 hr tablet TAKE 1 TABLET IN AM AND 1 TABLET AT LUNCH 180 tablet 1  . metoprolol succinate (TOPROL-XL) 25 MG 24 hr tablet TAKE 1 TABLET BY MOUTH TWICE A DAY AT 12 NOON AND 4PM 180 tablet 1  . mometasone (NASONEX) 50 MCG/ACT nasal spray Two sprays each nostril once a day if needed for nasal congestion or drainage. 51 g 3  . NEXIUM 40 MG capsule TAKE 1 CAPSULE BY MOUTH IN THE MORNING 90 capsule 2  . Olopatadine HCl 0.2 % SOLN Place 1 drop into both eyes daily as needed.  11  . polyvinyl alcohol-povidone (REFRESH) 1.4-0.6 % ophthalmic solution Place 1 drop into both eyes 2 (two) times daily.    . Probiotic Product (PROBIOTIC DAILY PO) Take 1 capsule by mouth daily.    Marland Kitchen spironolactone (ALDACTONE) 25  MG tablet TAEK 1 & 1/2 TAB BY MOUTH EVERY DAY 135 tablet 1  . UNABLE TO FIND Vitamin E Vaginal Suppository    . atorvastatin (LIPITOR) 40 MG tablet TAKE 1/2 TABLET ON MONDAY/WEDNESDAY/THURSDAY/FRIDAY AND SUNDAY AND 1 TAB ON TUESDAY/SATURDAY 58 tablet 3  . azithromycin (ZITHROMAX) 250 MG tablet Take 2 tabs PO x 1 dose, then 1 tab PO QD x 4 days (Patient not taking: Reported on 10/20/2017) 6 tablet 0  . benzonatate (TESSALON) 100 MG capsule Take 1-2 capsules (100-200 mg total) by mouth 3 (three) times daily as needed for cough. (Patient not taking: Reported  on 10/20/2017) 40 capsule 1  . levothyroxine (SYNTHROID, LEVOTHROID) 75 MCG tablet TAKE 1 TABLET BY MOUTH MONDAY THROUHG FRIDAY & 1/2 TAB ON SATURDAY & SUNDAY 90 tablet 0  . predniSONE (DELTASONE) 10 MG tablet 4 tab po day 1, 3 tab po day 2, 2 tab day, 1 tab day 4 (Patient not taking: Reported on 10/20/2017) 10 tablet 0   No facility-administered medications prior to visit.     Allergies  Allergen Reactions  . Codeine Anxiety    Jittery   . Penicillins Itching  . Sulfa Antibiotics Itching  . Vicodin [Hydrocodone-Acetaminophen] Itching    ITCHING     Review of Systems  Constitutional: Negative for chills, fever and malaise/fatigue.  HENT: Negative for congestion and hearing loss.   Eyes: Negative for discharge.  Respiratory: Negative for cough, sputum production and shortness of breath.   Cardiovascular: Negative for chest pain, palpitations and leg swelling.  Gastrointestinal: Negative for abdominal pain, blood in stool, constipation, diarrhea, heartburn, nausea and vomiting.  Genitourinary: Negative for dysuria, frequency, hematuria and urgency.  Musculoskeletal: Positive for joint pain and myalgias. Negative for back pain and falls.  Skin: Negative for rash.  Neurological: Negative for dizziness, sensory change, loss of consciousness, weakness and headaches.  Endo/Heme/Allergies: Negative for environmental allergies. Does not bruise/bleed easily.  Psychiatric/Behavioral: Negative for depression and suicidal ideas. The patient is not nervous/anxious and does not have insomnia.        Objective:    Physical Exam  Constitutional: She is oriented to person, place, and time. No distress.  HENT:  Head: Normocephalic and atraumatic.  Right Ear: External ear normal.  Left Ear: External ear normal.  Nose: Nose normal.  Mouth/Throat: Oropharynx is clear and moist. No oropharyngeal exudate.  Eyes: Pupils are equal, round, and reactive to light. Conjunctivae are normal. Right eye  exhibits no discharge. Left eye exhibits no discharge. No scleral icterus.  Neck: Normal range of motion. Neck supple. No thyromegaly present.  Cardiovascular: Normal rate, regular rhythm, normal heart sounds and intact distal pulses.  No murmur heard. Pulmonary/Chest: Effort normal and breath sounds normal. No respiratory distress. She has no wheezes. She has no rales.  Abdominal: Soft. Bowel sounds are normal. She exhibits no distension and no mass. There is no tenderness.  Musculoskeletal: Normal range of motion. She exhibits no edema or tenderness.  Lymphadenopathy:    She has no cervical adenopathy.  Neurological: She is alert and oriented to person, place, and time. She has normal reflexes. She displays normal reflexes. No cranial nerve deficit. Coordination normal.  Skin: Skin is warm and dry. No rash noted. She is not diaphoretic.    BP 109/71 (BP Location: Left Arm, Patient Position: Sitting, Cuff Size: Normal)   Pulse 96   Temp 98.1 F (36.7 C) (Oral)   Resp 18   Ht 5\' 5"  (1.651 m)   Wt  179 lb 9.6 oz (81.5 kg)   SpO2 98%   BMI 29.89 kg/m  Wt Readings from Last 3 Encounters:  11/03/17 180 lb (81.6 kg)  10/27/17 179 lb 9.6 oz (81.5 kg)  08/26/17 180 lb (81.6 kg)   BP Readings from Last 3 Encounters:  11/03/17 138/73  10/27/17 109/71  10/20/17 131/80     Immunization History  Administered Date(s) Administered  . Influenza, High Dose Seasonal PF 01/21/2016, 02/04/2017  . Influenza,inj,Quad PF,6+ Mos 01/04/2013, 01/23/2014, 12/28/2014  . Pneumococcal Conjugate-13 12/28/2014  . Pneumococcal Polysaccharide-23 05/12/2012  . Tdap 05/02/2014    Health Maintenance  Topic Date Due  . PNA vac Low Risk Adult (2 of 2 - PPSV23) 05/12/2017  . INFLUENZA VACCINE  11/19/2017  . HEMOGLOBIN A1C  04/22/2018  . OPHTHALMOLOGY EXAM  10/27/2018  . FOOT EXAM  10/28/2018  . MAMMOGRAM  10/23/2019  . COLONOSCOPY  07/20/2021  . TETANUS/TDAP  05/02/2024  . DEXA SCAN  Completed  .  Hepatitis C Screening  Completed    Lab Results  Component Value Date   WBC 6.1 10/20/2017   HGB 12.5 10/20/2017   HCT 38.1 10/20/2017   PLT 268.0 10/20/2017   GLUCOSE 105 (H) 10/20/2017   CHOL 187 10/20/2017   TRIG 135.0 10/20/2017   HDL 41.40 10/20/2017   LDLDIRECT 107.0 07/01/2016   LDLCALC 118 (H) 10/20/2017   ALT 20 10/20/2017   AST 17 10/20/2017   NA 140 10/20/2017   K 4.8 10/20/2017   CL 102 10/20/2017   CREATININE 0.93 10/20/2017   BUN 17 10/20/2017   CO2 31 10/20/2017   TSH 4.39 10/20/2017   INR 1.01 06/21/2015   HGBA1C 6.8 (H) 10/20/2017   MICROALBUR <0.7 12/28/2014    Lab Results  Component Value Date   TSH 4.39 10/20/2017   Lab Results  Component Value Date   WBC 6.1 10/20/2017   HGB 12.5 10/20/2017   HCT 38.1 10/20/2017   MCV 93.4 10/20/2017   PLT 268.0 10/20/2017   Lab Results  Component Value Date   NA 140 10/20/2017   K 4.8 10/20/2017   CO2 31 10/20/2017   GLUCOSE 105 (H) 10/20/2017   BUN 17 10/20/2017   CREATININE 0.93 10/20/2017   BILITOT 0.6 10/20/2017   ALKPHOS 59 10/20/2017   AST 17 10/20/2017   ALT 20 10/20/2017   PROT 6.6 10/20/2017   ALBUMIN 4.5 10/20/2017   CALCIUM 9.7 10/20/2017   ANIONGAP 9 07/04/2015   GFR 77.12 10/20/2017   Lab Results  Component Value Date   CHOL 187 10/20/2017   Lab Results  Component Value Date   HDL 41.40 10/20/2017   Lab Results  Component Value Date   LDLCALC 118 (H) 10/20/2017   Lab Results  Component Value Date   TRIG 135.0 10/20/2017   Lab Results  Component Value Date   CHOLHDL 5 10/20/2017   Lab Results  Component Value Date   HGBA1C 6.8 (H) 10/20/2017         Assessment & Plan:   Problem List Items Addressed This Visit    Essential hypertension    Well controlled, no changes to meds. Encouraged heart healthy diet such as the DASH diet and exercise as tolerated.       Relevant Medications   atorvastatin (LIPITOR) 40 MG tablet   Other Relevant Orders   CBC    Comprehensive metabolic panel   TSH   Osteopenia    Encouraged to get adequate exercise, calcium and vitamin  d intake      Preventative health care    Patient encouraged to maintain heart healthy diet, regular exercise, adequate sleep. Consider daily probiotics. Given and reviewed copy of ACP documents from [employer] and encouraged to complete and return. Labs reviewed      Neuropathy    Gabapentin 300 mg at bedtime is helping      Diabetes mellitus (HCC)    hgba1c acceptable, minimize simple carbs. Increase exercise as tolerated.       Relevant Medications   atorvastatin (LIPITOR) 40 MG tablet   Other Relevant Orders   Hemoglobin A1c   Left knee pain    Chronic and becoming more debilitating. Is referred to sports med for further consideration      Relevant Orders   DG Knee Complete 4 Views Left (Completed)   Ambulatory referral to Sports Medicine    Other Visit Diagnoses    Hyperlipidemia, unspecified hyperlipidemia type    -  Primary   Relevant Medications   atorvastatin (LIPITOR) 40 MG tablet   Other Relevant Orders   Lipid panel      I have discontinued Joycelyn Schmid I. Bradham's azithromycin, benzonatate, and predniSONE. I have also changed her atorvastatin. Additionally, I am having her maintain her calcium-vitamin D, loteprednol, Ciclopirox, cycloSPORINE, polyvinyl alcohol-povidone, Vitamin D, Probiotic Product (PROBIOTIC DAILY PO), aspirin, DOCOSAHEXAENOIC ACID PO, UNABLE TO FIND, Olopatadine HCl, Estradiol, diclofenac sodium, metoprolol succinate, gabapentin, metFORMIN, glucose blood, mometasone, levocetirizine, AVAPRO, diltiazem, NEXIUM, azelastine, levothyroxine, and spironolactone.  Meds ordered this encounter  Medications  . atorvastatin (LIPITOR) 40 MG tablet    Sig: 40 mg QOD alternating with 20 mg qod    Dispense:  66 tablet    Refill:  3    CMA served as scribe during this visit. History, Physical and Plan performed by medical provider.  Documentation and orders reviewed and attested to.  Penni Homans, MD

## 2017-10-27 NOTE — Assessment & Plan Note (Signed)
hgba1c acceptable, minimize simple carbs. Increase exercise as tolerated.  

## 2017-10-27 NOTE — Assessment & Plan Note (Signed)
Well controlled, no changes to meds. Encouraged heart healthy diet such as the DASH diet and exercise as tolerated.  °

## 2017-10-30 ENCOUNTER — Telehealth: Payer: Self-pay | Admitting: *Deleted

## 2017-10-30 NOTE — Telephone Encounter (Signed)
Received Mammogram results from [facility]; forwarded to provider/SLS 07/12

## 2017-11-03 ENCOUNTER — Ambulatory Visit (INDEPENDENT_AMBULATORY_CARE_PROVIDER_SITE_OTHER): Payer: Medicare Other | Admitting: Family Medicine

## 2017-11-03 ENCOUNTER — Encounter: Payer: Self-pay | Admitting: Family Medicine

## 2017-11-03 DIAGNOSIS — G8929 Other chronic pain: Secondary | ICD-10-CM | POA: Diagnosis not present

## 2017-11-03 DIAGNOSIS — M25562 Pain in left knee: Secondary | ICD-10-CM | POA: Diagnosis not present

## 2017-11-03 DIAGNOSIS — Z96653 Presence of artificial knee joint, bilateral: Secondary | ICD-10-CM | POA: Insufficient documentation

## 2017-11-03 NOTE — Patient Instructions (Addendum)
Your pain is due to arthritis. These are the different medications you can take for this: Tylenol 500mg  1-2 tabs three times a day for pain. Capsaicin, aspercreme, or biofreeze topically up to four times a day may also help with pain. Some supplements that may help for arthritis: Boswellia extract, curcumin, pycnogenol Aleve 1-2 tabs twice a day with food as last line. Cortisone injections are an option - call me if you're hurting bad enough you want to pursue this. If cortisone injections do not help, there are different types of shots that may help but they take longer to take effect. It's important that you continue to stay active. Straight leg raises, knee extensions 3 sets of 10 once a day (add ankle weight if these become too easy). Consider physical therapy to strengthen muscles around the joint that hurts to take pressure off of the joint itself. Shoe inserts with good arch support may be helpful. Heat or ice 15 minutes at a time 3-4 times a day as needed to help with pain. Water aerobics and cycling with low resistance are the best two types of exercise for arthritis though any exercise is ok as long as it doesn't worsen the pain. Follow up with me as needed.

## 2017-11-03 NOTE — Progress Notes (Signed)
PCP and consultation requested by: Mosie Lukes, MD  Subjective:   HPI: Patient is a 67 y.o. female here for left knee pain.  Patient reports she's had some anterior left knee pain radiating to the back of her knee. Pain noticed more with walking. Currently pain is 0/10 level, is a soreness with walking. No skin changes, numbness.  Past Medical History:  Diagnosis Date  . Arthritis    "both knees" (07/02/2015)  . Cancer of right breast ([place])   . Claustrophobia   . Dysrhythmia    HX OF FAST HEART RATE AND PALPITATIONS - METOPROLOL HAS HELPED  . GERD (gastroesophageal reflux disease)   . H/O hiatal hernia   . H/O iritis    LEFT EYE - STATES HER EYE BECOMES RED AND VERY SENSITIVE TO LIGHT WHEN FLARE UP OF IRITIS  . Heart murmur    "benign; 06/2015"  . High cholesterol   . History of blood transfusion    "when I had my partial hysterectomy"  . History of chickenpox 09/26/2014  . History of measles   . History of MRSA infection   . History of shingles   . Hypertension   . Hypothyroidism   . Medicare annual wellness visit, initial 04/07/2015  . Multinodular thyroid    PT HAVING TROUBLE SWALLOWING  . Neuropathy    FEET  . Primary localized osteoarthritis of right knee   . Sciatica of right side 04/25/2015  . Spinal headache    spinal headache with epidural  . Type II diabetes mellitus (HCC)    ORAL MEDICATION - NO INSULIN  . Wears glasses     Current Outpatient Medications on File Prior to Visit  Medication Sig Dispense Refill  . aspirin 81 MG EC tablet Take 81 mg by mouth every other day. Swallow whole.    Marland Kitchen atorvastatin (LIPITOR) 40 MG tablet 40 mg QOD alternating with 20 mg qod 66 tablet 3  . AVAPRO 150 MG tablet TAKE 1/2 TO 1 (ONE-HALF TO ONE) TABLET BY MOUTH ONCE DAILY 90 tablet 2  . azelastine (ASTELIN) 0.1 % nasal spray Place 2 sprays into both nostrils 2 (two) times daily. Use in each nostril as directed 30 mL 12  . azithromycin (ZITHROMAX) 250 MG tablet Take 2  tabs PO x 1 dose, then 1 tab PO QD x 4 days (Patient not taking: Reported on 10/20/2017) 6 tablet 0  . benzonatate (TESSALON) 100 MG capsule Take 1-2 capsules (100-200 mg total) by mouth 3 (three) times daily as needed for cough. (Patient not taking: Reported on 10/20/2017) 40 capsule 1  . calcium-vitamin D (OSCAL WITH D) 250-125 MG-UNIT per tablet Take 1 tablet by mouth daily.     . Cholecalciferol (VITAMIN D) 2000 UNITS CAPS Take 1 capsule by mouth every evening.     . Ciclopirox 1 % shampoo Apply 1 each topically every Friday.     . cycloSPORINE (RESTASIS) 0.05 % ophthalmic emulsion Place 1 drop into both eyes 2 (two) times daily.    . diclofenac sodium (VOLTAREN) 1 % GEL APPLY 2-4 GRAM TO AFFECTED AREA 4 TIMES A DAY AS NEEDED  1  . diltiazem (TIAZAC) 360 MG 24 hr capsule TAKE 1 CAPSULE (360 MG TOTAL) BY MOUTH EVERY MORNING. 90 capsule 3  . DOCOSAHEXAENOIC ACID PO Take by mouth.    . Estradiol 10 MCG TABS vaginal tablet 1 tablet per vagina at bedtime QOD 30 tablet 12  . gabapentin (NEURONTIN) 300 MG capsule TAKE 1 CAPSULE (300  MG TOTAL) BY MOUTH AT BEDTIME. 270 capsule 0  . glucose blood (ONE TOUCH TEST STRIPS) test strip Use as directed twice daily to check blood sugar. DX code E11.9 300 each 1  . levocetirizine (XYZAL) 5 MG tablet TAKE 1 TABLET (5 MG TOTAL) BY MOUTH EVERY EVENING FOR RUNNY NOSE OR ITCHING 90 tablet 2  . levothyroxine (SYNTHROID, LEVOTHROID) 75 MCG tablet TAKE 1 TABLET BY MOUTH MONDAY THROUHG FRIDAY & 1/2 TAB ON SATURDAY & SUNDAY 90 tablet 0  . levothyroxine (SYNTHROID, LEVOTHROID) 75 MCG tablet TAKE ONE TABLET MONDAY THROUGH FRIDAY AND ONE HALF TABLET SATURDAY AND SUNDAY 90 tablet 0  . loteprednol (LOTEMAX) 0.5 % ophthalmic suspension Place 1 drop into the left eye daily as needed ("arthiritis of the eye").     . metFORMIN (GLUCOPHAGE-XR) 750 MG 24 hr tablet TAKE 1 TABLET IN AM AND 1 TABLET AT LUNCH 180 tablet 1  . metoprolol succinate (TOPROL-XL) 25 MG 24 hr tablet TAKE 1 TABLET  BY MOUTH TWICE A DAY AT 12 NOON AND 4PM 180 tablet 1  . mometasone (NASONEX) 50 MCG/ACT nasal spray Two sprays each nostril once a day if needed for nasal congestion or drainage. 51 g 3  . NEXIUM 40 MG capsule TAKE 1 CAPSULE BY MOUTH IN THE MORNING 90 capsule 2  . Olopatadine HCl 0.2 % SOLN Place 1 drop into both eyes daily as needed.  11  . polyvinyl alcohol-povidone (REFRESH) 1.4-0.6 % ophthalmic solution Place 1 drop into both eyes 2 (two) times daily.    . predniSONE (DELTASONE) 10 MG tablet 4 tab po day 1, 3 tab po day 2, 2 tab day, 1 tab day 4 (Patient not taking: Reported on 10/20/2017) 10 tablet 0  . Probiotic Product (PROBIOTIC DAILY PO) Take 1 capsule by mouth daily.    Marland Kitchen spironolactone (ALDACTONE) 25 MG tablet TAEK 1 & 1/2 TAB BY MOUTH EVERY DAY 135 tablet 1  . tretinoin (RETIN-A) 0.025 % cream APPLY 1 APPLICATION TOPICALLY EVERY DAY  11  . UNABLE TO FIND Vitamin E Vaginal Suppository     No current facility-administered medications on file prior to visit.     Past Surgical History:  Procedure Laterality Date  . ABDOMINAL HYSTERECTOMY  1988  . ABDOMINOPLASTY  1986  . ANTERIOR AND POSTERIOR REPAIR  05/11/2012   Procedure: ANTERIOR (CYSTOCELE) AND POSTERIOR REPAIR (RECTOCELE);  Surgeon: Reece Packer, MD;  Location: [facility];  Service: Urology;;  with graft  . BREAST BIOPSY Right   . CARDIAC CATHETERIZATION  1999  . COLONOSCOPY    . CYSTOSCOPY WITH URETHRAL DILATATION  05/11/2012   Procedure: CYSTOSCOPY WITH URETHRAL DILATATION;  Surgeon: Reece Packer, MD;  Location: [facility];  Service: Urology;;  . ESOPHAGOGASTRODUODENOSCOPY    . HERNIA REPAIR    . MASTECTOMY Right 2007  . PLANTAR FASCIA SURGERY Left   . THYROIDECTOMY N/A 02/23/2014   Procedure: TOTAL THYROIDECTOMY;  Surgeon: Armandina Gemma, MD;  Location: [facility];  Service: General;  Laterality: N/A;  . TOTAL KNEE ARTHROPLASTY Right 07/02/2015  . TOTAL KNEE ARTHROPLASTY Right 07/02/2015   Procedure: TOTAL KNEE ARTHROPLASTY;   Surgeon: Elsie Saas, MD;  Location: [facility];  Service: Orthopedics;  Laterality: Right;  . TOTAL THYROIDECTOMY  1976   removed three tumor (2 on right; 1 on left)  . TUBAL LIGATION    . UMBILICAL HERNIA REPAIR  1986  . VAGINAL PROLAPSE REPAIR  05/11/2012   Procedure: VAGINAL VAULT SUSPENSION;  Surgeon: Elayne Snare MacDiarmid,  MD;  Location: [facility];  Service: Urology;;  . VULVA / PERINEUM BIOPSY     vulvar    Allergies  Allergen Reactions  . Codeine Anxiety    Jittery   . Penicillins Itching  . Sulfa Antibiotics Itching  . Vicodin [Hydrocodone-Acetaminophen] Itching    ITCHING     Social History   Socioeconomic History  . Marital status: Married    Spouse name: Not on file  . Number of children: 3  . Years of education: Not on file  . Highest education level: Not on file  Occupational History    Comment: Retired  Scientific laboratory technician  . Financial resource strain: Not on file  . Food insecurity:    Worry: Not on file    Inability: Not on file  . Transportation needs:    Medical: Not on file    Non-medical: Not on file  Tobacco Use  . Smoking status: Never Smoker  . Smokeless tobacco: Never Used  Substance and Sexual Activity  . Alcohol use: No    Alcohol/week: 0.0 oz  . Drug use: No  . Sexual activity: Yes    Birth control/protection: Surgical    Comment: lives with husband, retired from various jobs, diabetic diet  Lifestyle  . Physical activity:    Days per week: Not on file    Minutes per session: Not on file  . Stress: Not on file  Relationships  . Social connections:    Talks on phone: Not on file    Gets together: Not on file    Attends religious service: Not on file    Active member of club or organization: Not on file    Attends meetings of clubs or organizations: Not on file    Relationship status: Not on file  . Intimate partner violence:    Fear of current or ex partner: Not on file    Emotionally abused: Not on file    Physically abused: Not on file     Forced sexual activity: Not on file  Other Topics Concern  . Not on file  Social History Narrative  . Not on file    Family History  Problem Relation Age of Onset  . Hypertension Mother   . Heart disease Mother        pacer, CHF  . Hyperlipidemia Mother   . Kidney disease Mother        1 kidney due to stone  . Kidney Stones Mother   . Hypertension Father   . Cancer Father        lung, psa  . Diabetes Father   . Alcohol abuse Father   . Diabetes Sister   . Cancer Sister   . Hypertension Sister   . Diabetes Brother   . Hypertension Brother   . Hyperlipidemia Brother   . Kidney disease Brother        dialysis  . Stroke Brother   . Hypertension Maternal Grandmother   . Heart disease Maternal Grandmother   . Stroke Maternal Grandmother   . Hypertension Maternal Grandfather   . Hypertension Daughter   . Arthritis Son   . Hyperlipidemia Sister   . Hypertension Sister   . Diabetes Sister   . Thyroid disease Sister   . Kidney disease Sister   . Cancer Sister        thyroid and breast  . Arthritis Sister        back pain  . Hypertension Sister   . Thyroid  disease Sister   . Hypertension Brother   . Hyperlipidemia Brother   . Hypertension Brother   . Hyperlipidemia Brother   . Benign prostatic hyperplasia Brother   . Hypertension Daughter   . Hyperlipidemia Daughter   . Diabetes Daughter   . Hypertension Maternal Aunt   . Hypertension Maternal Uncle   . Hypertension Paternal Aunt   . Cancer Paternal Aunt   . Hypertension Paternal Uncle     BP 138/73   Pulse (!) 56   Ht 5\' 5"  (1.651 m)   Wt 180 lb (81.6 kg)   BMI 29.95 kg/m   Review of Systems: See HPI above.     Objective:  Physical Exam:  Gen: NAD, comfortable in exam room  Left knee: No gross deformity, ecchymoses, swelling. Mild medial joint line tenderness.  No other tenderness. FROM with 5/5 strength. Negative ant/post drawers. Negative valgus/varus testing. Negative lachmanns. Negative  mcmurrays, apleys, patellar apprehension. NV intact distally.  Right knee: No deformity. FROM with 5/5 strength. No tenderness to palpation. NVI distally.   Assessment & Plan:  1. Left knee pain - independently reviewed radiographs noting moderate arthritis.  Her history and exam are consistent with this, otherwise reassuring.  Discussed tylenol, topical medications, aleve, supplements that may help.  Shown home exercises to do daily.  Consider physical therapy, injection if not improving.  F/u prn.

## 2017-11-03 NOTE — Assessment & Plan Note (Signed)
independently reviewed radiographs noting moderate arthritis.  Her history and exam are consistent with this, otherwise reassuring.  Discussed tylenol, topical medications, aleve, supplements that may help.  Shown home exercises to do daily.  Consider physical therapy, injection if not improving.  F/u prn.

## 2017-11-04 NOTE — Assessment & Plan Note (Signed)
Chronic and becoming more debilitating. Is referred to sports med for further consideration

## 2017-11-04 NOTE — Assessment & Plan Note (Signed)
Encouraged heart healthy diet, increase exercise, avoid trans fats, consider a krill oil cap daily 

## 2017-11-05 ENCOUNTER — Encounter: Payer: Self-pay | Admitting: Family Medicine

## 2017-11-10 ENCOUNTER — Encounter: Payer: Self-pay | Admitting: Podiatry

## 2017-11-10 ENCOUNTER — Ambulatory Visit (INDEPENDENT_AMBULATORY_CARE_PROVIDER_SITE_OTHER): Payer: Medicare Other | Admitting: Podiatry

## 2017-11-10 DIAGNOSIS — M21962 Unspecified acquired deformity of left lower leg: Secondary | ICD-10-CM | POA: Diagnosis not present

## 2017-11-10 DIAGNOSIS — E1140 Type 2 diabetes mellitus with diabetic neuropathy, unspecified: Secondary | ICD-10-CM

## 2017-11-10 DIAGNOSIS — E1149 Type 2 diabetes mellitus with other diabetic neurological complication: Secondary | ICD-10-CM | POA: Diagnosis not present

## 2017-11-10 DIAGNOSIS — M216X9 Other acquired deformities of unspecified foot: Secondary | ICD-10-CM

## 2017-11-10 NOTE — Progress Notes (Signed)
Subjective: 67 y.o. year old female patient presents requesting a yearly check up and order a pair of diabetic shoes. Patient has to use orthotics and diabetic shoes in order to do her daily routine.  Had taken cancer medication for Breast cancer 2007. Right breast removed.. Has diabetic neuropathy. A1c was 6.7 a month ago.  Objective: Dermatologic: Thick yellow deformed nails bilateral. Vascular: Pedal pulses are all palpable. No edema or erythema noted. Orthopedic: Hypermobile and elevated first ray bilateral. Pain on right medial ankle with prolonged weight bearing. Neurologic: All epicritic and tactile sensations grossly intact.  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 STJ hyperpronation bilateral. Diabetic neuropathy.  Treatment: Reviewed findings. Will order diabetic shoe once PCP signed form has returned.

## 2017-11-10 NOTE — Patient Instructions (Signed)
Seen for diabetic foot care. As per request will order diabetic shoes.    Return in 3 months or as needed.

## 2017-11-12 ENCOUNTER — Telehealth: Payer: Self-pay | Admitting: Family Medicine

## 2017-11-12 NOTE — Telephone Encounter (Signed)
Copied from [place] [id number]. Topic: Quick Communication - See Telephone Encounter >> Nov 12, 2017  1:55 PM Rosalin Hawking wrote: CRM for notification. See Telephone encounter for: 11/12/17.    Pt dropped off document to be filled out by provider (Dr Comfort for a boot) Pt would like document to be faxed when ready to the fax number on document. Document put at front office tray under providers name.

## 2017-11-16 NOTE — Telephone Encounter (Signed)
Completed as much as possible on paperwork; forwarded to provider who will RTO on Thurs, 11/19/17./SLS 07/29

## 2017-11-22 ENCOUNTER — Other Ambulatory Visit: Payer: Self-pay | Admitting: Family Medicine

## 2017-11-24 NOTE — Telephone Encounter (Signed)
[name] from [place] is calling to see if paperwork is complete   Cb# [phone]

## 2017-11-26 NOTE — Telephone Encounter (Signed)
Do you have paperwork   Please advise

## 2017-11-27 ENCOUNTER — Telehealth: Payer: Self-pay | Admitting: Family Medicine

## 2017-11-27 NOTE — Telephone Encounter (Signed)
Copied from [place] [phone]. Topic: Quick Communication - See Telephone Encounter >> Nov 27, 2017  9:06 AM Vernona Rieger wrote: CRM for notification. See Telephone encounter for: 11/27/17.  Patient called and stated she needs a new script sent to fax [phone] [facility] ) for glucose blood (ONE TOUCH TEST STRIPS) test strip.  Please advise.

## 2017-11-30 MED ORDER — GLUCOSE BLOOD VI STRP: ORAL_STRIP | 1 refills | Status: AC

## 2017-11-30 NOTE — Telephone Encounter (Signed)
rx sent in 

## 2017-11-30 NOTE — Telephone Encounter (Signed)
Victoria Walker from [place] is calling about status of ppw.  She states she sent in ppw 2 weeks ago, and would like call back on status   cb is [phone]

## 2017-12-01 DIAGNOSIS — E1140 Type 2 diabetes mellitus with diabetic neuropathy, unspecified: Secondary | ICD-10-CM | POA: Diagnosis not present

## 2017-12-01 NOTE — Telephone Encounter (Signed)
This paperwork has been sent in at least a half dozen times; Dr. Charlett Blake and I sent the last one back to [employer] with a note on it reflecting this

## 2017-12-02 ENCOUNTER — Other Ambulatory Visit: Payer: Self-pay | Admitting: Family Medicine

## 2017-12-02 ENCOUNTER — Encounter: Payer: Self-pay | Admitting: Podiatry

## 2017-12-02 ENCOUNTER — Ambulatory Visit (INDEPENDENT_AMBULATORY_CARE_PROVIDER_SITE_OTHER): Payer: PRIVATE HEALTH INSURANCE | Admitting: Podiatry

## 2017-12-02 DIAGNOSIS — E1149 Type 2 diabetes mellitus with other diabetic neurological complication: Secondary | ICD-10-CM

## 2017-12-02 DIAGNOSIS — E1140 Type 2 diabetes mellitus with diabetic neuropathy, unspecified: Secondary | ICD-10-CM

## 2017-12-02 DIAGNOSIS — M21962 Unspecified acquired deformity of left lower leg: Secondary | ICD-10-CM

## 2017-12-02 DIAGNOSIS — M216X9 Other acquired deformities of unspecified foot: Secondary | ICD-10-CM

## 2017-12-02 NOTE — Patient Instructions (Signed)
Both feet measured for diabetic shoes. 

## 2017-12-02 NOTE — Progress Notes (Signed)
Subjective: 67 y.o. year old female patient presents requesting a pair of diabetic shoes. Patient presented a signed PCP certification for diabetic shoes.  HPI: Patient has to use orthotics and diabetic shoes in order to do her daily routine.  Had taken cancer medication for Breast cancer 2007. Right breast removed.. Has diabetic neuropathy. A1c was 6.7 a month ago.  Objective: Dermatologic: Thick yellow deformed nails bilateral. Vascular: Pedal pulses are all palpable. No edema or erythema noted. Orthopedic: Hypermobile and elevated first ray bilateral. Pain on right medial ankle with prolonged weight bearing. Neurologic: All epicritic and tactile sensations grossly intact.  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 STJ hyperpronation bilateral. Diabetic neuropathy.  Treatment: Both feet measured for diabetic shoes.

## 2017-12-07 ENCOUNTER — Telehealth: Payer: Self-pay | Admitting: *Deleted

## 2017-12-07 NOTE — Telephone Encounter (Signed)
Received Physician Orders from [place], we have completed this form Numerous times and returned to sender with notes as to please let us know if there is anything else that we need to do on form with no response, only a duplicate return of the form that they continue to request. Completed as much as possible; forwarded to provider/SLS Paperwork has been faxed with confirmation/SLS 08/19

## 2017-12-08 NOTE — Addendum Note (Signed)
Addended by: Wynonia Musty A on: 12/08/2017 10:10 AM   Modules accepted: Orders

## 2017-12-16 ENCOUNTER — Telehealth: Payer: Self-pay

## 2017-12-16 NOTE — Telephone Encounter (Signed)
Pt notified that diabetic shoes are ready for pickup. 

## 2017-12-24 ENCOUNTER — Other Ambulatory Visit: Payer: Self-pay | Admitting: Obstetrics & Gynecology

## 2018-01-07 ENCOUNTER — Other Ambulatory Visit: Payer: Self-pay | Admitting: Family Medicine

## 2018-01-07 ENCOUNTER — Other Ambulatory Visit (HOSPITAL_COMMUNITY)
Admission: RE | Admit: 2018-01-07 | Discharge: 2018-01-07 | Disposition: A | Discharge status: Home / Self Care | Payer: Medicare Other | Source: Ambulatory Visit | Attending: Family Medicine | Admitting: Family Medicine

## 2018-01-07 ENCOUNTER — Other Ambulatory Visit (INDEPENDENT_AMBULATORY_CARE_PROVIDER_SITE_OTHER): Payer: Medicare Other

## 2018-01-07 DIAGNOSIS — N898 Other specified noninflammatory disorders of vagina: Secondary | ICD-10-CM | POA: Diagnosis not present

## 2018-01-07 DIAGNOSIS — L298 Other pruritus: Secondary | ICD-10-CM | POA: Diagnosis not present

## 2018-01-07 DIAGNOSIS — R350 Frequency of micturition: Secondary | ICD-10-CM | POA: Diagnosis not present

## 2018-01-07 LAB — URINALYSIS
Bilirubin Urine: NEGATIVE
Hgb urine dipstick: NEGATIVE
Ketones, ur: NEGATIVE
Leukocytes, UA: NEGATIVE
Nitrite: NEGATIVE
Specific Gravity, Urine: 1.015 (ref 1.000–1.030)
Total Protein, Urine: NEGATIVE
Urine Glucose: NEGATIVE
Urobilinogen, UA: 0.2 (ref 0.0–1.0)
pH: 6 (ref 5.0–8.0)

## 2018-01-07 NOTE — Progress Notes (Signed)
Left message for patient to call the office back

## 2018-01-08 LAB — URINE CULTURE
MICRO NUMBER:: 91126167
SPECIMEN QUALITY:: ADEQUATE

## 2018-01-11 LAB — URINE CYTOLOGY ANCILLARY ONLY
Bacterial vaginitis: NEGATIVE
Candida vaginitis: NEGATIVE

## 2018-01-28 ENCOUNTER — Ambulatory Visit (INDEPENDENT_AMBULATORY_CARE_PROVIDER_SITE_OTHER): Payer: Medicare Other

## 2018-01-28 DIAGNOSIS — Z23 Encounter for immunization: Secondary | ICD-10-CM | POA: Diagnosis not present

## 2018-02-16 ENCOUNTER — Other Ambulatory Visit: Payer: Self-pay | Admitting: Family Medicine

## 2018-02-16 DIAGNOSIS — K219 Gastro-esophageal reflux disease without esophagitis: Secondary | ICD-10-CM

## 2018-02-16 DIAGNOSIS — I10 Essential (primary) hypertension: Secondary | ICD-10-CM

## 2018-02-16 DIAGNOSIS — R109 Unspecified abdominal pain: Secondary | ICD-10-CM

## 2018-02-16 DIAGNOSIS — E785 Hyperlipidemia, unspecified: Secondary | ICD-10-CM

## 2018-02-16 DIAGNOSIS — Z780 Asymptomatic menopausal state: Secondary | ICD-10-CM

## 2018-02-23 DIAGNOSIS — E1140 Type 2 diabetes mellitus with diabetic neuropathy, unspecified: Secondary | ICD-10-CM | POA: Diagnosis not present

## 2018-03-10 NOTE — Progress Notes (Addendum)
Subjective:   Victoria Walker is a 67 y.o. female who presents for Medicare Annual (Subsequent) preventive examination.  Review of Systems: No ROS.  Medicare Wellness Visit. Additional risk factors are reflected in the social history. Cardiac Risk Factors include: diabetes mellitus;dyslipidemia;hypertension Sleep patterns:  No issues. Home Safety/Smoke Alarms: Feels safe in home. Smoke alarms in place. Lives with husband in 1 story home with basement.    Female:   Pap-hysterectomy       Mammo-utd       Dexa scan- utd       CCS- Next due 07/20/2021  Eye- Dr[facility]Seldomridge at [facility]. Every 6 months for glaucoma and DM eye exams.    Objective:     Vitals: BP 120/80 (BP Location: Left Arm, Patient Position: Sitting, Cuff Size: Normal)   Pulse 77   Ht 5\' 5"  (1.651 m)   Wt 182 lb 3.2 oz (82.6 kg)   SpO2 98%   BMI 30.32 kg/m   Body mass index is 30.32 kg/m.  Advanced Directives 03/11/2018 03/17/2017 03/10/2017 02/25/2016 12/29/2015 12/07/2015 07/02/2015  Does Patient Have a Medical Advance Directive? Yes Yes Yes Yes Yes Yes Yes  Type of Paramedic of [place];Living will - [place];Living will [place];Living will Living will;Healthcare Power of Attorney Living will [place]  Does patient want to make changes to medical advance directive? - No - Patient declined - - - - No - Patient declined  Copy of [place] in Chart? Yes - validated most recent copy scanned in chart (See row information) - No - copy requested No - copy requested No - copy requested - Yes  Would patient like information on creating a medical advance directive? - - - - - - -  Pre-existing out of facility DNR order (yellow form or pink MOST form) - - - - - - -    Tobacco Social History   Tobacco Use  Smoking Status Never Smoker  Smokeless Tobacco Never Used     Counseling given: Not  Answered   Clinical Intake: Pain : No/denies pain   Past Medical History:  Diagnosis Date  . Arthritis    "both knees" (07/02/2015)  . Cancer of right breast ([place])   . Claustrophobia   . Dysrhythmia    HX OF FAST HEART RATE AND PALPITATIONS - METOPROLOL HAS HELPED  . GERD (gastroesophageal reflux disease)   . H/O hiatal hernia   . H/O iritis    LEFT EYE - STATES HER EYE BECOMES RED AND VERY SENSITIVE TO LIGHT WHEN FLARE UP OF IRITIS  . Heart murmur    "benign; 06/2015"  . High cholesterol   . History of blood transfusion    "when I had my partial hysterectomy"  . History of chickenpox 09/26/2014  . History of measles   . History of MRSA infection   . History of shingles   . Hypertension   . Hypothyroidism   . Medicare annual wellness visit, initial 04/07/2015  . Multinodular thyroid    PT HAVING TROUBLE SWALLOWING  . Neuropathy    FEET  . Primary localized osteoarthritis of right knee   . Sciatica of right side 04/25/2015  . Spinal headache    spinal headache with epidural  . Type II diabetes mellitus (HCC)    ORAL MEDICATION - NO INSULIN  . Wears glasses    Past Surgical History:  Procedure Laterality Date  . ABDOMINAL  HYSTERECTOMY  1988  . ABDOMINOPLASTY  1986  . ANTERIOR AND POSTERIOR REPAIR  05/11/2012   Procedure: ANTERIOR (CYSTOCELE) AND POSTERIOR REPAIR (RECTOCELE);  Surgeon: Reece Packer, MD;  Location: [facility];  Service: Urology;;  with graft  . BREAST BIOPSY Right   . CARDIAC CATHETERIZATION  1999  . COLONOSCOPY    . CYSTOSCOPY WITH URETHRAL DILATATION  05/11/2012   Procedure: CYSTOSCOPY WITH URETHRAL DILATATION;  Surgeon: Reece Packer, MD;  Location: [facility];  Service: Urology;;  . ESOPHAGOGASTRODUODENOSCOPY    . HERNIA REPAIR    . MASTECTOMY Right 2007  . PLANTAR FASCIA SURGERY Left   . THYROIDECTOMY N/A 02/23/2014   Procedure: TOTAL THYROIDECTOMY;  Surgeon: Armandina Gemma, MD;  Location: [facility];  Service: General;  Laterality: N/A;  . TOTAL KNEE  ARTHROPLASTY Right 07/02/2015  . TOTAL KNEE ARTHROPLASTY Right 07/02/2015   Procedure: TOTAL KNEE ARTHROPLASTY;  Surgeon: Elsie Saas, MD;  Location: [facility];  Service: Orthopedics;  Laterality: Right;  . TOTAL THYROIDECTOMY  1976   removed three tumor (2 on right; 1 on left)  . TUBAL LIGATION    . UMBILICAL HERNIA REPAIR  1986  . VAGINAL PROLAPSE REPAIR  05/11/2012   Procedure: VAGINAL VAULT SUSPENSION;  Surgeon: Reece Packer, MD;  Location: [facility];  Service: Urology;;  . VULVA / PERINEUM BIOPSY     vulvar   Family History  Problem Relation Age of Onset  . Hypertension Mother   . Heart disease Mother        pacer, CHF  . Hyperlipidemia Mother   . Kidney disease Mother        1 kidney due to stone  . Kidney Stones Mother   . Hypertension Father   . Cancer Father        lung, psa  . Diabetes Father   . Alcohol abuse Father   . Diabetes Sister   . Cancer Sister   . Hypertension Sister   . Diabetes Brother   . Hypertension Brother   . Hyperlipidemia Brother   . Kidney disease Brother        dialysis  . Stroke Brother   . Hypertension Maternal Grandmother   . Heart disease Maternal Grandmother   . Stroke Maternal Grandmother   . Hypertension Maternal Grandfather   . Hypertension Daughter   . Arthritis Son   . Hyperlipidemia Sister   . Hypertension Sister   . Diabetes Sister   . Thyroid disease Sister   . Kidney disease Sister   . Cancer Sister        thyroid and breast  . Arthritis Sister        back pain  . Hypertension Sister   . Thyroid disease Sister   . Hypertension Brother   . Hyperlipidemia Brother   . Hypertension Brother   . Hyperlipidemia Brother   . Benign prostatic hyperplasia Brother   . Hypertension Daughter   . Hyperlipidemia Daughter   . Diabetes Daughter   . Hypertension Maternal Aunt   . Hypertension Maternal Uncle   . Hypertension Paternal Aunt   . Cancer Paternal Aunt   . Hypertension Paternal Uncle    Social History    Socioeconomic History  . Marital status: Married    Spouse name: Not on file  . Number of children: 3  . Years of education: Not on file  . Highest education level: Not on file  Occupational History    Comment: Retired  Scientific laboratory technician  .  Financial resource strain: Not on file  . Food insecurity:    Worry: Not on file    Inability: Not on file  . Transportation needs:    Medical: Not on file    Non-medical: Not on file  Tobacco Use  . Smoking status: Never Smoker  . Smokeless tobacco: Never Used  Substance and Sexual Activity  . Alcohol use: No    Alcohol/week: 0.0 standard drinks  . Drug use: No  . Sexual activity: Not Currently    Birth control/protection: Surgical    Comment: lives with husband, retired from various jobs, diabetic diet  Lifestyle  . Physical activity:    Days per week: Not on file    Minutes per session: Not on file  . Stress: Not on file  Relationships  . Social connections:    Talks on phone: Not on file    Gets together: Not on file    Attends religious service: Not on file    Active member of club or organization: Not on file    Attends meetings of clubs or organizations: Not on file    Relationship status: Not on file  Other Topics Concern  . Not on file  Social History Narrative  . Not on file    Outpatient Encounter Medications as of 03/11/2018  Medication Sig  . aspirin 81 MG EC tablet Take 81 mg by mouth every other day. Swallow whole.  Marland Kitchen atorvastatin (LIPITOR) 40 MG tablet 40 mg QOD alternating with 20 mg qod  . AVAPRO 150 MG tablet TAKE 1/2 TO 1 (ONE-HALF TO ONE) TABLET BY MOUTH ONCE DAILY  . azelastine (ASTELIN) 0.1 % nasal spray Place 2 sprays into both nostrils 2 (two) times daily. Use in each nostril as directed  . calcium-vitamin D (OSCAL WITH D) 250-125 MG-UNIT per tablet Take 1 tablet by mouth daily.   . Cholecalciferol (VITAMIN D) 2000 UNITS CAPS Take 1 capsule by mouth every evening.   . Ciclopirox 1 % shampoo Apply 1 each  topically every Friday.   . cycloSPORINE (RESTASIS) 0.05 % ophthalmic emulsion Place 1 drop into both eyes 2 (two) times daily.  Marland Kitchen diltiazem (TIAZAC) 360 MG 24 hr capsule TAKE 1 CAPSULE (360 MG TOTAL) BY MOUTH EVERY MORNING.  . DOCOSAHEXAENOIC ACID PO Take by mouth.  . gabapentin (NEURONTIN) 300 MG capsule TAKE 1 CAPSULE (300 MG TOTAL) BY MOUTH AT BEDTIME.  Marland Kitchen glucose blood (ONE TOUCH TEST STRIPS) test strip Use as directed twice daily to check blood sugar. DX code E11.9  . levocetirizine (XYZAL) 5 MG tablet TAKE 1 TABLET (5 MG TOTAL) BY MOUTH EVERY EVENING FOR RUNNY NOSE OR ITCHING  . levothyroxine (SYNTHROID, LEVOTHROID) 75 MCG tablet TAKE ONE TABLET MONDAY THROUGH FRIDAY AND ONE HALF TABLET SATURDAY AND SUNDAY  . loteprednol (LOTEMAX) 0.5 % ophthalmic suspension Place 1 drop into the left eye daily as needed ("arthiritis of the eye").   . metFORMIN (GLUCOPHAGE-XR) 750 MG 24 hr tablet TAKE 1 TABLET IN AM AND 1 TABLET AT LUNCH  . metoprolol succinate (TOPROL-XL) 25 MG 24 hr tablet TAKE 1 TABLET BY MOUTH TWICE A DAY AT 12 NOON AND 4PM  . mometasone (NASONEX) 50 MCG/ACT nasal spray Two sprays each nostril once a day if needed for nasal congestion or drainage.  Marland Kitchen NEXIUM 40 MG capsule TAKE 1 CAPSULE BY MOUTH IN THE MORNING  . Olopatadine HCl 0.2 % SOLN Place 1 drop into both eyes daily as needed.  . polyvinyl alcohol-povidone (REFRESH) 1.4-0.6 %  ophthalmic solution Place 1 drop into both eyes 2 (two) times daily.  . Probiotic Product (PROBIOTIC DAILY PO) Take 1 capsule by mouth daily.  Marland Kitchen spironolactone (ALDACTONE) 25 MG tablet TAEK 1 & 1/2 TAB BY MOUTH EVERY DAY  . tretinoin (RETIN-A) 0.025 % cream APPLY 1 APPLICATION TOPICALLY EVERY DAY  . YUVAFEM 10 MCG TABS vaginal tablet INSERT 1 TAB VAGINALLY AT BEDTIME EVERY OTHER DAY  . diclofenac sodium (VOLTAREN) 1 % GEL APPLY 2-4 GRAM TO AFFECTED AREA 4 TIMES A DAY AS NEEDED  . UNABLE TO FIND Vitamin E Vaginal Suppository   No facility-administered  encounter medications on file as of 03/11/2018.     Activities of Daily Living In your present state of health, do you have any difficulty performing the following activities: 03/11/2018  Hearing? N  Vision? N  Difficulty concentrating or making decisions? N  Walking or climbing stairs? N  Dressing or bathing? N  Doing errands, shopping? N  Preparing Food and eating ? N  Using the Toilet? N  In the past six months, have you accidently leaked urine? N  Do you have problems with loss of bowel control? N  Managing your Medications? N  Managing your Finances? N  Housekeeping or managing your Housekeeping? N  Some recent data might be hidden    Patient Care Team: Mosie Lukes, MD as PCP - General (Family Medicine) Juanita Craver, MD as Consulting Physician (Gastroenterology) Seldomridge, Lois Huxley, MD (Ophthalmology) Camelia Phenes, DPM as Consulting Physician (Podiatry) Iona Beard, OD as Referring Physician (Optometry) Charlies Silvers, MD as Consulting Physician (Allergy and Immunology) Lavonia Drafts, MD as Consulting Physician (Obstetrics and Gynecology) Elsie Saas, MD as Consulting Physician (Orthopedic Surgery)    Assessment:   This is a routine wellness examination for [facility]. Physical assessment deferred to PCP.  Exercise Activities and Dietary recommendations Current Exercise Habits: Structured exercise class([employer]), Type of exercise: strength training/weights(bike), Time (Minutes): 60, Frequency (Times/Week): 3, Weekly Exercise (Minutes/Week): 180, Intensity: Mild, Exercise limited by: None identified   Diet (meal preparation, eat out, water intake, caffeinated beverages, dairy products, fruits and vegetables): 24 hour recall Breakfast: cheerios and egg Lunch: banana Dinner: [place], dressing, cranberry, green beans. Snack- french fries Pt reports she needs to drink more water  Goals    . Increase physical activity    . Lose 10 lbs by next year  (pt-stated)       Fall Risk Fall Risk  03/11/2018 03/10/2017 02/25/2016 02/22/2015 01/23/2014  Falls in the past year? 0 No No No No    Depression Screen PHQ 2/9 Scores 03/11/2018 03/10/2017 02/25/2016 02/22/2015  PHQ - 2 Score 0 0 0 0     Cognitive Function Ad8 score reviewed for issues:  Issues making decisions:no  Less interest in hobbies / activities:no  Repeats questions, stories (family complaining):no  Trouble using ordinary gadgets (microwave, computer, phone):no  Forgets the month or year: no  Mismanaging finances: no  Remembering appts:no  Daily problems with thinking and/or memory:no Ad8 score is=0   MMSE - Mini Mental State Exam 03/10/2017 02/25/2016 02/22/2015  Orientation to time 5 5 5   Orientation to Place 5 5 5   Registration 3 3 3   Attention/ Calculation 5 5 5   Recall 3 3 1   Language- name 2 objects 2 2 2   Language- repeat 1 1 1   Language- follow 3 step command 3 3 3   Language- read & follow direction 1 1 1   Write a sentence 1 1 1   Copy  design 1 0 1  Total score 30 29 28         Immunization History  Administered Date(s) Administered  . Influenza Whole 01/28/2017  . Influenza, High Dose Seasonal PF 01/21/2016, 02/04/2017, 01/28/2018  . Influenza,inj,Quad PF,6+ Mos 01/04/2013, 01/23/2014, 12/28/2014  . Pneumococcal Conjugate-13 12/28/2014  . Pneumococcal Polysaccharide-23 05/12/2012, 10/27/2017  . Tdap 05/02/2014  . Zoster Recombinat (Shingrix) 01/15/2018    Screening Tests Health Maintenance  Topic Date Due  . HEMOGLOBIN A1C  04/22/2018  . OPHTHALMOLOGY EXAM  10/27/2018  . FOOT EXAM  10/28/2018  . MAMMOGRAM  10/23/2019  . COLONOSCOPY  07/20/2021  . TETANUS/TDAP  05/02/2024  . INFLUENZA VACCINE  Completed  . DEXA SCAN  Completed  . Hepatitis C Screening  Completed  . PNA vac Low Risk Adult  Completed       Plan:    Please schedule your next medicare wellness visit with me in 1 yr.  Continue to eat heart healthy diet (full of  fruits, vegetables, whole grains, lean protein, water--limit salt, fat, and sugar intake) and increase physical activity as tolerated.  Continue doing brain stimulating activities (puzzles, reading, adult coloring books, staying active) to keep memory sharp.    I have personally reviewed and noted the following in the patient's chart:   . Medical and social history . Use of alcohol, tobacco or illicit drugs  . Current medications and supplements . Functional ability and status . Nutritional status . Physical activity . Advanced directives . List of other physicians . Hospitalizations, surgeries, and ER visits in previous 12 months . Vitals . Screenings to include cognitive, depression, and falls . Referrals and appointments  In addition, I have reviewed and discussed with patient certain preventive protocols, quality metrics, and best practice recommendations. A written personalized care plan for preventive services as well as general preventive health recommendations were provided to patient.     Shela Nevin, [place]  03/11/2018   Medical screening examination/treatment was performed by qualified clinical staff member and as supervising physician I was immediately available for consultation/collaboration. I have reviewed documentation and agree with assessment and plan.  Penni Homans, MD

## 2018-03-11 ENCOUNTER — Encounter: Payer: Self-pay | Admitting: *Deleted

## 2018-03-11 ENCOUNTER — Ambulatory Visit (INDEPENDENT_AMBULATORY_CARE_PROVIDER_SITE_OTHER): Payer: Medicare Other | Admitting: *Deleted

## 2018-03-11 VITALS — BP 120/80 | HR 77 | Ht 65.0 in | Wt 182.2 lb

## 2018-03-11 DIAGNOSIS — Z0000 Encounter for general adult medical examination without abnormal findings: Secondary | ICD-10-CM | POA: Diagnosis not present

## 2018-03-11 NOTE — Patient Instructions (Signed)
Please schedule your next medicare wellness visit with me in 1 yr.  Continue to eat heart healthy diet (full of fruits, vegetables, whole grains, lean protein, water--limit salt, fat, and sugar intake) and increase physical activity as tolerated.  Continue doing brain stimulating activities (puzzles, reading, adult coloring books, staying active) to keep memory sharp.    [name] , Thank you for taking time to come for your Medicare Wellness Visit. I appreciate your ongoing commitment to your health goals. Please review the following plan we discussed and let me know if I can assist you in the future.   These are the goals we discussed: Goals    . Increase physical activity    . Lose 10 lbs by next year (pt-stated)       This is a list of the screening recommended for you and due dates:  Health Maintenance  Topic Date Due  . Hemoglobin A1C  04/22/2018  . Eye exam for diabetics  10/27/2018  . Complete foot exam   10/28/2018  . Mammogram  10/23/2019  . Colon Cancer Screening  07/20/2021  . Tetanus Vaccine  05/02/2024  . Flu Shot  Completed  . DEXA scan (bone density measurement)  Completed  .  Hepatitis C: One time screening is recommended by Center for Disease Control  (CDC) for  adults born from [birth year] through 1966.   Completed  . Pneumonia vaccines  Completed    Health Maintenance for Postmenopausal Women Menopause is a normal process in which your reproductive ability comes to an end. This process happens gradually over a span of months to years, usually between the ages of 41 and 3. Menopause is complete when you have missed 12 consecutive menstrual periods. It is important to talk with your health care provider about some of the most common conditions that affect postmenopausal women, such as heart disease, cancer, and bone loss (osteoporosis). Adopting a healthy lifestyle and getting preventive care can help to promote your health and wellness. Those actions can also lower  your chances of developing some of these common conditions. What should I know about menopause? During menopause, you may experience a number of symptoms, such as:  Moderate-to-severe hot flashes.  Night sweats.  Decrease in sex drive.  Mood swings.  Headaches.  Tiredness.  Irritability.  Memory problems.  Insomnia.  Choosing to treat or not to treat menopausal changes is an individual decision that you make with your health care provider. What should I know about hormone replacement therapy and supplements? Hormone therapy products are effective for treating symptoms that are associated with menopause, such as hot flashes and night sweats. Hormone replacement carries certain risks, especially as you become older. If you are thinking about using estrogen or estrogen with progestin treatments, discuss the benefits and risks with your health care provider. What should I know about heart disease and stroke? Heart disease, heart attack, and stroke become more likely as you age. This may be due, in part, to the hormonal changes that your body experiences during menopause. These can affect how your body processes dietary fats, triglycerides, and cholesterol. Heart attack and stroke are both medical emergencies. There are many things that you can do to help prevent heart disease and stroke:  Have your blood pressure checked at least every 1-2 years. High blood pressure causes heart disease and increases the risk of stroke.  If you are 68-79 years old, ask your health care provider if you should take aspirin to prevent a heart  attack or a stroke.  Do not use any tobacco products, including cigarettes, chewing tobacco, or electronic cigarettes. If you need help quitting, ask your health care provider.  It is important to eat a healthy diet and maintain a healthy weight. ? Be sure to include plenty of vegetables, fruits, low-fat dairy products, and lean protein. ? Avoid eating foods that  are high in solid fats, added sugars, or salt (sodium).  Get regular exercise. This is one of the most important things that you can do for your health. ? Try to exercise for at least 150 minutes each week. The type of exercise that you do should increase your heart rate and make you sweat. This is known as moderate-intensity exercise. ? Try to do strengthening exercises at least twice each week. Do these in addition to the moderate-intensity exercise.  Know your numbers.Ask your health care provider to check your cholesterol and your blood glucose. Continue to have your blood tested as directed by your health care provider.  What should I know about cancer screening? There are several types of cancer. Take the following steps to reduce your risk and to catch any cancer development as early as possible. Breast Cancer  Practice breast self-awareness. ? This means understanding how your breasts normally appear and feel. ? It also means doing regular breast self-exams. Let your health care provider know about any changes, no matter how small.  If you are 68 or older, have a clinician do a breast exam (clinical breast exam or CBE) every year. Depending on your age, family history, and medical history, it may be recommended that you also have a yearly breast X-ray (mammogram).  If you have a family history of breast cancer, talk with your health care provider about genetic screening.  If you are at high risk for breast cancer, talk with your health care provider about having an MRI and a mammogram every year.  Breast cancer (BRCA) gene test is recommended for women who have family members with BRCA-related cancers. Results of the assessment will determine the need for genetic counseling and BRCA1 and for BRCA2 testing. BRCA-related cancers include these types: ? Breast. This occurs in males or females. ? Ovarian. ? Tubal. This may also be called fallopian tube cancer. ? Cancer of the abdominal  or pelvic lining (peritoneal cancer). ? Prostate. ? Pancreatic.  Cervical, Uterine, and Ovarian Cancer Your health care provider may recommend that you be screened regularly for cancer of the pelvic organs. These include your ovaries, uterus, and vagina. This screening involves a pelvic exam, which includes checking for microscopic changes to the surface of your cervix (Pap test).  For women ages 21-65, health care providers may recommend a pelvic exam and a Pap test every three years. For women ages 58-65, they may recommend the Pap test and pelvic exam, combined with testing for human papilloma virus (HPV), every five years. Some types of HPV increase your risk of cervical cancer. Testing for HPV may also be done on women of any age who have unclear Pap test results.  Other health care providers may not recommend any screening for nonpregnant women who are considered low risk for pelvic cancer and have no symptoms. Ask your health care provider if a screening pelvic exam is right for you.  If you have had past treatment for cervical cancer or a condition that could lead to cancer, you need Pap tests and screening for cancer for at least 20 years after your treatment. If  Pap tests have been discontinued for you, your risk factors (such as having a new sexual partner) need to be reassessed to determine if you should start having screenings again. Some women have medical problems that increase the chance of getting cervical cancer. In these cases, your health care provider may recommend that you have screening and Pap tests more often.  If you have a family history of uterine cancer or ovarian cancer, talk with your health care provider about genetic screening.  If you have vaginal bleeding after reaching menopause, tell your health care provider.  There are currently no reliable tests available to screen for ovarian cancer.  Lung Cancer Lung cancer screening is recommended for adults 2-80 years  old who are at high risk for lung cancer because of a history of smoking. A yearly low-dose CT scan of the lungs is recommended if you:  Currently smoke.  Have a history of at least 30 pack-years of smoking and you currently smoke or have quit within the past 15 years. A pack-year is smoking an average of one pack of cigarettes per day for one year.  Yearly screening should:  Continue until it has been 15 years since you quit.  Stop if you develop a health problem that would prevent you from having lung cancer treatment.  Colorectal Cancer  This type of cancer can be detected and can often be prevented.  Routine colorectal cancer screening usually begins at age 35 and continues through age 52.  If you have risk factors for colon cancer, your health care provider may recommend that you be screened at an earlier age.  If you have a family history of colorectal cancer, talk with your health care provider about genetic screening.  Your health care provider may also recommend using home test kits to check for hidden blood in your stool.  A small camera at the end of a tube can be used to examine your colon directly (sigmoidoscopy or colonoscopy). This is done to check for the earliest forms of colorectal cancer.  Direct examination of the colon should be repeated every 5-10 years until age 28. However, if early forms of precancerous polyps or small growths are found or if you have a family history or genetic risk for colorectal cancer, you may need to be screened more often.  Skin Cancer  Check your skin from head to toe regularly.  Monitor any moles. Be sure to tell your health care provider: ? About any new moles or changes in moles, especially if there is a change in a mole's shape or color. ? If you have a mole that is larger than the size of a pencil eraser.  If any of your family members has a history of skin cancer, especially at a young age, talk with your health care provider  about genetic screening.  Always use sunscreen. Apply sunscreen liberally and repeatedly throughout the day.  Whenever you are outside, protect yourself by wearing long sleeves, pants, a wide-brimmed hat, and sunglasses.  What should I know about osteoporosis? Osteoporosis is a condition in which bone destruction happens more quickly than new bone creation. After menopause, you may be at an increased risk for osteoporosis. To help prevent osteoporosis or the bone fractures that can happen because of osteoporosis, the following is recommended:  If you are 6-104 years old, get at least 1,000 mg of calcium and at least 600 mg of vitamin D per day.  If you are older than age 32 but  younger than age 19, get at least 1,200 mg of calcium and at least 600 mg of vitamin D per day.  If you are older than age 39, get at least 1,200 mg of calcium and at least 800 mg of vitamin D per day.  Smoking and excessive alcohol intake increase the risk of osteoporosis. Eat foods that are rich in calcium and vitamin D, and do weight-bearing exercises several times each week as directed by your health care provider. What should I know about how menopause affects my mental health? Depression may occur at any age, but it is more common as you become older. Common symptoms of depression include:  Low or sad mood.  Changes in sleep patterns.  Changes in appetite or eating patterns.  Feeling an overall lack of motivation or enjoyment of activities that you previously enjoyed.  Frequent crying spells.  Talk with your health care provider if you think that you are experiencing depression. What should I know about immunizations? It is important that you get and maintain your immunizations. These include:  Tetanus, diphtheria, and pertussis (Tdap) booster vaccine.  Influenza every year before the flu season begins.  Pneumonia vaccine.  Shingles vaccine.  Your health care provider may also recommend other  immunizations. This information is not intended to replace advice given to you by your health care provider. Make sure you discuss any questions you have with your health care provider. Document Released: 05/30/2005 Document Revised: 10/26/2015 Document Reviewed: 01/09/2015 Elsevier Interactive Patient Education  2018 [employer].

## 2018-04-05 DIAGNOSIS — Z9011 Acquired absence of right breast and nipple: Secondary | ICD-10-CM | POA: Diagnosis not present

## 2018-04-15 ENCOUNTER — Other Ambulatory Visit: Payer: Self-pay | Admitting: Family Medicine

## 2018-04-15 DIAGNOSIS — K219 Gastro-esophageal reflux disease without esophagitis: Secondary | ICD-10-CM

## 2018-04-15 DIAGNOSIS — Z9011 Acquired absence of right breast and nipple: Secondary | ICD-10-CM | POA: Diagnosis not present

## 2018-04-15 DIAGNOSIS — I10 Essential (primary) hypertension: Secondary | ICD-10-CM

## 2018-04-19 DIAGNOSIS — Z9011 Acquired absence of right breast and nipple: Secondary | ICD-10-CM | POA: Diagnosis not present

## 2018-04-27 ENCOUNTER — Other Ambulatory Visit (INDEPENDENT_AMBULATORY_CARE_PROVIDER_SITE_OTHER): Payer: Medicare Other

## 2018-04-27 DIAGNOSIS — I10 Essential (primary) hypertension: Secondary | ICD-10-CM | POA: Diagnosis not present

## 2018-04-27 DIAGNOSIS — E118 Type 2 diabetes mellitus with unspecified complications: Secondary | ICD-10-CM | POA: Diagnosis not present

## 2018-04-27 DIAGNOSIS — E785 Hyperlipidemia, unspecified: Secondary | ICD-10-CM | POA: Diagnosis not present

## 2018-04-27 LAB — COMPREHENSIVE METABOLIC PANEL
ALT: 19 U/L (ref 0–35)
AST: 26 U/L (ref 0–37)
Albumin: 4.5 g/dL (ref 3.5–5.2)
Alkaline Phosphatase: 51 U/L (ref 39–117)
BUN: 12 mg/dL (ref 6–23)
CO2: 28 mEq/L (ref 19–32)
Calcium: 9.4 mg/dL (ref 8.4–10.5)
Chloride: 100 mEq/L (ref 96–112)
Creatinine, Ser: 0.82 mg/dL (ref 0.40–1.20)
GFR: 89.04 mL/min (ref >60.00)
Glucose, Bld: 91 mg/dL (ref 70–99)
Potassium: 5.3 mEq/L — ABNORMAL HIGH (ref 3.5–5.1)
Sodium: 137 mEq/L (ref 135–145)
Total Bilirubin: 0.5 mg/dL (ref 0.2–1.2)
Total Protein: 6.7 g/dL (ref 6.0–8.3)

## 2018-04-27 LAB — CBC
HCT: 40.2 % (ref 36.0–46.0)
Hemoglobin: 13.1 g/dL (ref 12.0–15.0)
MCHC: 32.6 g/dL (ref 30.0–36.0)
MCV: 94.6 fl (ref 78.0–100.0)
Platelets: 317 10*3/uL (ref 150.0–400.0)
RBC: 4.25 Mil/uL (ref 3.87–5.11)
RDW: 14 % (ref 11.5–15.5)
WBC: 5.9 10*3/uL (ref 4.0–10.5)

## 2018-04-27 LAB — LIPID PANEL
Cholesterol: 185 mg/dL (ref 0–200)
HDL: 39.4 mg/dL (ref >39.00)
LDL Cholesterol: 118 mg/dL — ABNORMAL HIGH (ref 0–99)
NonHDL: 145.71
Total CHOL/HDL Ratio: 5
Triglycerides: 141 mg/dL (ref 0.0–149.0)
VLDL: 28.2 mg/dL (ref 0.0–40.0)

## 2018-04-27 LAB — TSH: TSH: 5.4 u[IU]/mL — ABNORMAL HIGH (ref 0.35–4.50)

## 2018-04-27 LAB — HEMOGLOBIN A1C: Hgb A1c MFr Bld: 6.8 % — ABNORMAL HIGH (ref 4.6–6.5)

## 2018-04-28 ENCOUNTER — Other Ambulatory Visit: Payer: Self-pay | Admitting: Family Medicine

## 2018-04-29 ENCOUNTER — Encounter (INDEPENDENT_AMBULATORY_CARE_PROVIDER_SITE_OTHER): Payer: PRIVATE HEALTH INSURANCE

## 2018-05-03 DIAGNOSIS — H40023 Open angle with borderline findings, high risk, bilateral: Secondary | ICD-10-CM | POA: Diagnosis not present

## 2018-05-04 ENCOUNTER — Ambulatory Visit (INDEPENDENT_AMBULATORY_CARE_PROVIDER_SITE_OTHER): Payer: Medicare Other | Admitting: Family Medicine

## 2018-05-04 VITALS — BP 118/60 | HR 66 | Temp 98.0°F | Resp 18 | Ht 65.0 in | Wt 177.0 lb

## 2018-05-04 DIAGNOSIS — R350 Frequency of micturition: Secondary | ICD-10-CM

## 2018-05-04 DIAGNOSIS — E875 Hyperkalemia: Secondary | ICD-10-CM | POA: Insufficient documentation

## 2018-05-04 DIAGNOSIS — M8580 Other specified disorders of bone density and structure, unspecified site: Secondary | ICD-10-CM

## 2018-05-04 DIAGNOSIS — I10 Essential (primary) hypertension: Secondary | ICD-10-CM

## 2018-05-04 DIAGNOSIS — N941 Unspecified dyspareunia: Secondary | ICD-10-CM

## 2018-05-04 DIAGNOSIS — E0842 Diabetes mellitus due to underlying condition with diabetic polyneuropathy: Secondary | ICD-10-CM

## 2018-05-04 DIAGNOSIS — E1169 Type 2 diabetes mellitus with other specified complication: Secondary | ICD-10-CM | POA: Diagnosis not present

## 2018-05-04 DIAGNOSIS — E782 Mixed hyperlipidemia: Secondary | ICD-10-CM

## 2018-05-04 DIAGNOSIS — E039 Hypothyroidism, unspecified: Secondary | ICD-10-CM

## 2018-05-04 DIAGNOSIS — G5601 Carpal tunnel syndrome, right upper limb: Secondary | ICD-10-CM | POA: Insufficient documentation

## 2018-05-04 LAB — COMPREHENSIVE METABOLIC PANEL
ALT: 22 U/L (ref 0–35)
AST: 18 U/L (ref 0–37)
Albumin: 4.6 g/dL (ref 3.5–5.2)
Alkaline Phosphatase: 59 U/L (ref 39–117)
BUN: 16 mg/dL (ref 6–23)
CO2: 30 mEq/L (ref 19–32)
Calcium: 9.9 mg/dL (ref 8.4–10.5)
Chloride: 102 mEq/L (ref 96–112)
Creatinine, Ser: 0.98 mg/dL (ref 0.40–1.20)
GFR: 72.49 mL/min (ref >60.00)
Glucose, Bld: 98 mg/dL (ref 70–99)
Potassium: 5.1 mEq/L (ref 3.5–5.1)
Sodium: 140 mEq/L (ref 135–145)
Total Bilirubin: 0.6 mg/dL (ref 0.2–1.2)
Total Protein: 7 g/dL (ref 6.0–8.3)

## 2018-05-04 LAB — URINALYSIS
Bilirubin Urine: NEGATIVE
Hgb urine dipstick: NEGATIVE
Ketones, ur: NEGATIVE
Leukocytes, UA: NEGATIVE
Nitrite: NEGATIVE
Specific Gravity, Urine: 1.01 (ref 1.000–1.030)
Total Protein, Urine: NEGATIVE
Urine Glucose: NEGATIVE
Urobilinogen, UA: 0.2 (ref 0.0–1.0)
pH: 6.5 (ref 5.0–8.0)

## 2018-05-04 MED ORDER — LEVOTHYROXINE SODIUM 88 MCG PO TABS: 88.0000 ug | ORAL_TABLET | Freq: Every day | ORAL | 3 refills | Status: DC

## 2018-05-04 NOTE — Assessment & Plan Note (Signed)
Will refer to nutrition to continue to manage

## 2018-05-04 NOTE — Assessment & Plan Note (Signed)
Will ice, apply lidocaine patches bid and splint given splint today

## 2018-05-04 NOTE — Assessment & Plan Note (Signed)
Check urinalysis and culture today 

## 2018-05-04 NOTE — Assessment & Plan Note (Signed)
On Levothyroxine, continue to monitor 

## 2018-05-04 NOTE — Assessment & Plan Note (Signed)
Encouraged to get adequate exercise, calcium and vitamin d intake 

## 2018-05-04 NOTE — Assessment & Plan Note (Signed)
hgba1c acceptable, minimize simple carbs. Increase exercise as tolerated. Continue current meds. Referred to nutrition

## 2018-05-04 NOTE — Progress Notes (Signed)
Subjective:    Patient ID: Victoria Walker, female    DOB: 03-28-1951, 68 y.o.   MRN: [id number]  No chief complaint on file.   HPI Patient is in today for follow-up.  Overall she feels well but she does have a few concerns.  1 is she is interested in a referral to nutritionist to help try and clean up her diet and minimize carbohydrates.  The other is that her right wrist has been bothering her lately.  She has had trouble in the past and splinting was helpful but she can no longer find her splint.  She has pain with movement and aching.  No radicular symptoms or falls. Denies CP/palp/SOB/HA/congestion/fevers/GI or GU c/o. Taking meds as prescribed  Past Medical History:  Diagnosis Date  . Arthritis    "both knees" (07/02/2015)  . Cancer of right breast ([place])   . Claustrophobia   . Dysrhythmia    HX OF FAST HEART RATE AND PALPITATIONS - METOPROLOL HAS HELPED  . GERD (gastroesophageal reflux disease)   . H/O hiatal hernia   . H/O iritis    LEFT EYE - STATES HER EYE BECOMES RED AND VERY SENSITIVE TO LIGHT WHEN FLARE UP OF IRITIS  . Heart murmur    "benign; 06/2015"  . High cholesterol   . History of blood transfusion    "when I had my partial hysterectomy"  . History of chickenpox 09/26/2014  . History of measles   . History of MRSA infection   . History of shingles   . Hypertension   . Hypothyroidism   . Medicare annual wellness visit, initial 04/07/2015  . Multinodular thyroid    PT HAVING TROUBLE SWALLOWING  . Neuropathy    FEET  . Primary localized osteoarthritis of right knee   . Sciatica of right side 04/25/2015  . Spinal headache    spinal headache with epidural  . Type II diabetes mellitus (HCC)    ORAL MEDICATION - NO INSULIN  . Wears glasses     Past Surgical History:  Procedure Laterality Date  . ABDOMINAL HYSTERECTOMY  1988  . ABDOMINOPLASTY  1986  . ANTERIOR AND POSTERIOR REPAIR  05/11/2012   Procedure: ANTERIOR (CYSTOCELE) AND POSTERIOR REPAIR  (RECTOCELE);  Surgeon: Reece Packer, MD;  Location: [facility];  Service: Urology;;  with graft  . BREAST BIOPSY Right   . CARDIAC CATHETERIZATION  1999  . COLONOSCOPY    . CYSTOSCOPY WITH URETHRAL DILATATION  05/11/2012   Procedure: CYSTOSCOPY WITH URETHRAL DILATATION;  Surgeon: Reece Packer, MD;  Location: [facility];  Service: Urology;;  . ESOPHAGOGASTRODUODENOSCOPY    . HERNIA REPAIR    . MASTECTOMY Right 2007  . PLANTAR FASCIA SURGERY Left   . THYROIDECTOMY N/A 02/23/2014   Procedure: TOTAL THYROIDECTOMY;  Surgeon: Armandina Gemma, MD;  Location: [facility];  Service: General;  Laterality: N/A;  . TOTAL KNEE ARTHROPLASTY Right 07/02/2015  . TOTAL KNEE ARTHROPLASTY Right 07/02/2015   Procedure: TOTAL KNEE ARTHROPLASTY;  Surgeon: Elsie Saas, MD;  Location: [facility];  Service: Orthopedics;  Laterality: Right;  . TOTAL THYROIDECTOMY  1976   removed three tumor (2 on right; 1 on left)  . TUBAL LIGATION    . UMBILICAL HERNIA REPAIR  1986  . VAGINAL PROLAPSE REPAIR  05/11/2012   Procedure: VAGINAL VAULT SUSPENSION;  Surgeon: Reece Packer, MD;  Location: [facility];  Service: Urology;;  . Clayborne Dana / PERINEUM BIOPSY     vulvar    Family History  Problem Relation Age of Onset  . Hypertension Mother   . Heart disease Mother        pacer, CHF  . Hyperlipidemia Mother   . Kidney disease Mother        1 kidney due to stone  . Kidney Stones Mother   . Hypertension Father   . Cancer Father        lung, psa  . Diabetes Father   . Alcohol abuse Father   . Diabetes Sister   . Cancer Sister   . Hypertension Sister   . Diabetes Brother   . Hypertension Brother   . Hyperlipidemia Brother   . Kidney disease Brother        dialysis  . Stroke Brother   . Hypertension Maternal Grandmother   . Heart disease Maternal Grandmother   . Stroke Maternal Grandmother   . Hypertension Maternal Grandfather   . Hypertension Daughter   . Arthritis Son   . Hyperlipidemia Sister   . Hypertension Sister   .  Diabetes Sister   . Thyroid disease Sister   . Kidney disease Sister   . Cancer Sister        thyroid and breast  . Arthritis Sister        back pain  . Hypertension Sister   . Thyroid disease Sister   . Hypertension Brother   . Hyperlipidemia Brother   . Hypertension Brother   . Hyperlipidemia Brother   . Benign prostatic hyperplasia Brother   . Hypertension Daughter   . Hyperlipidemia Daughter   . Diabetes Daughter   . Hypertension Maternal Aunt   . Hypertension Maternal Uncle   . Hypertension Paternal Aunt   . Cancer Paternal Aunt   . Hypertension Paternal Uncle     Social History   Socioeconomic History  . Marital status: Married    Spouse name: Not on file  . Number of children: 3  . Years of education: Not on file  . Highest education level: Not on file  Occupational History    Comment: Retired  Scientific laboratory technician  . Financial resource strain: Not on file  . Food insecurity:    Worry: Not on file    Inability: Not on file  . Transportation needs:    Medical: Not on file    Non-medical: Not on file  Tobacco Use  . Smoking status: Never Smoker  . Smokeless tobacco: Never Used  Substance and Sexual Activity  . Alcohol use: No    Alcohol/week: 0.0 standard drinks  . Drug use: No  . Sexual activity: Not Currently    Birth control/protection: Surgical    Comment: lives with husband, retired from various jobs, diabetic diet  Lifestyle  . Physical activity:    Days per week: Not on file    Minutes per session: Not on file  . Stress: Not on file  Relationships  . Social connections:    Talks on phone: Not on file    Gets together: Not on file    Attends religious service: Not on file    Active member of club or organization: Not on file    Attends meetings of clubs or organizations: Not on file    Relationship status: Not on file  . Intimate partner violence:    Fear of current or ex partner: Not on file    Emotionally abused: Not on file    Physically  abused: Not on file    Forced sexual activity: Not on file  Other Topics Concern  . Not on file  Social History Narrative  . Not on file    Outpatient Medications Prior to Visit  Medication Sig Dispense Refill  . aspirin 81 MG EC tablet Take 81 mg by mouth every other day. Swallow whole.    Marland Kitchen atorvastatin (LIPITOR) 40 MG tablet 40 mg QOD alternating with 20 mg qod 66 tablet 3  . AVAPRO 150 MG tablet TAKE 1/2 TO 1 (ONE-HALF TO ONE) TABLET BY MOUTH ONCE DAILY 90 tablet 0  . azelastine (ASTELIN) 0.1 % nasal spray Place 2 sprays into both nostrils 2 (two) times daily. Use in each nostril as directed 30 mL 12  . calcium-vitamin D (OSCAL WITH D) 250-125 MG-UNIT per tablet Take 1 tablet by mouth daily.     . Cholecalciferol (VITAMIN D) 2000 UNITS CAPS Take 1 capsule by mouth every evening.     . Ciclopirox 1 % shampoo Apply 1 each topically every Friday.     . cycloSPORINE (RESTASIS) 0.05 % ophthalmic emulsion Place 1 drop into both eyes 2 (two) times daily.    . diclofenac sodium (VOLTAREN) 1 % GEL APPLY 2-4 GRAM TO AFFECTED AREA 4 TIMES A DAY AS NEEDED  1  . diltiazem (TIAZAC) 360 MG 24 hr capsule TAKE 1 CAPSULE (360 MG TOTAL) BY MOUTH EVERY MORNING. 90 capsule 3  . DOCOSAHEXAENOIC ACID PO Take by mouth.    . gabapentin (NEURONTIN) 300 MG capsule TAKE 1 CAPSULE (300 MG TOTAL) BY MOUTH AT BEDTIME. 90 capsule 2  . glucose blood (ONE TOUCH TEST STRIPS) test strip Use as directed twice daily to check blood sugar. DX code E11.9 300 each 1  . levocetirizine (XYZAL) 5 MG tablet TAKE 1 TABLET (5 MG TOTAL) BY MOUTH EVERY EVENING FOR RUNNY NOSE OR ITCHING 90 tablet 2  . loteprednol (LOTEMAX) 0.5 % ophthalmic suspension Place 1 drop into the left eye daily as needed ("arthiritis of the eye").     . metFORMIN (GLUCOPHAGE-XR) 750 MG 24 hr tablet TAKE 1 TABLET IN AM AND 1 TABLET AT LUNCH 180 tablet 1  . metoprolol succinate (TOPROL-XL) 25 MG 24 hr tablet TAKE 1 TABLET BY MOUTH TWICE A DAY AT 12 NOON AND 4PM  180 tablet 1  . mometasone (NASONEX) 50 MCG/ACT nasal spray Two sprays each nostril once a day if needed for nasal congestion or drainage. 51 g 3  . NEXIUM 40 MG capsule TAKE 1 CAPSULE BY MOUTH IN THE MORNING 90 capsule 0  . Olopatadine HCl 0.2 % SOLN Place 1 drop into both eyes daily as needed.  11  . polyvinyl alcohol-povidone (REFRESH) 1.4-0.6 % ophthalmic solution Place 1 drop into both eyes 2 (two) times daily.    . Probiotic Product (PROBIOTIC DAILY PO) Take 1 capsule by mouth daily.    Marland Kitchen spironolactone (ALDACTONE) 25 MG tablet TAEK 1 & 1/2 TAB BY MOUTH EVERY DAY 135 tablet 1  . tretinoin (RETIN-A) 0.025 % cream APPLY 1 APPLICATION TOPICALLY EVERY DAY  11  . UNABLE TO FIND Vitamin E Vaginal Suppository    . YUVAFEM 10 MCG TABS vaginal tablet INSERT 1 TAB VAGINALLY AT BEDTIME EVERY OTHER DAY 16 tablet 12  . levothyroxine (SYNTHROID, LEVOTHROID) 75 MCG tablet TAKE ONE TABLET MONDAY THROUGH FRIDAY AND ONE HALF TABLET SATURDAY AND SUNDAY 77 tablet 1   No facility-administered medications prior to visit.     Allergies  Allergen Reactions  . Codeine Anxiety    Jittery   . Penicillins Itching  .  Sulfa Antibiotics Itching  . Vicodin [Hydrocodone-Acetaminophen] Itching    ITCHING     Review of Systems  Constitutional: Negative for fever and malaise/fatigue.  HENT: Negative for congestion.   Eyes: Negative for blurred vision.  Respiratory: Negative for shortness of breath.   Cardiovascular: Negative for chest pain, palpitations and leg swelling.  Gastrointestinal: Negative for abdominal pain, blood in stool and nausea.  Genitourinary: Positive for frequency and urgency. Negative for dysuria, flank pain and hematuria.  Musculoskeletal: Positive for joint pain. Negative for falls.  Skin: Negative for rash.  Neurological: Negative for dizziness, loss of consciousness and headaches.  Endo/Heme/Allergies: Negative for environmental allergies.  Psychiatric/Behavioral: Negative for  depression. The patient is not nervous/anxious.        Objective:    Physical Exam Vitals signs and nursing note reviewed.  Constitutional:      General: She is not in acute distress.    Appearance: She is well-developed.  HENT:     Head: Normocephalic and atraumatic.     Nose: Nose normal.  Eyes:     General:        Right eye: No discharge.        Left eye: No discharge.  Neck:     Musculoskeletal: Normal range of motion and neck supple.  Cardiovascular:     Rate and Rhythm: Normal rate and regular rhythm.     Heart sounds: No murmur.  Pulmonary:     Effort: Pulmonary effort is normal.     Breath sounds: Normal breath sounds.  Abdominal:     General: Bowel sounds are normal.     Palpations: Abdomen is soft.     Tenderness: There is no abdominal tenderness.  Skin:    General: Skin is warm and dry.  Neurological:     Mental Status: She is alert and oriented to person, place, and time.     BP 118/60 (BP Location: Left Arm, Patient Position: Sitting, Cuff Size: Normal)   Pulse 66   Temp 98 F (36.7 C) (Oral)   Resp 18   Ht 5\' 5"  (1.651 m)   Wt 177 lb (80.3 kg)   SpO2 99%   BMI 29.45 kg/m  Wt Readings from Last 3 Encounters:  05/04/18 177 lb (80.3 kg)  03/11/18 182 lb 3.2 oz (82.6 kg)  11/03/17 180 lb (81.6 kg)     Lab Results  Component Value Date   WBC 5.9 04/27/2018   HGB 13.1 04/27/2018   HCT 40.2 04/27/2018   PLT 317.0 04/27/2018   GLUCOSE 91 04/27/2018   CHOL 185 04/27/2018   TRIG 141.0 04/27/2018   HDL 39.40 04/27/2018   LDLDIRECT 107.0 07/01/2016   LDLCALC 118 (H) 04/27/2018   ALT 19 04/27/2018   AST 26 04/27/2018   NA 137 04/27/2018   K 5.3 Hemolyzed (H) 04/27/2018   CL 100 04/27/2018   CREATININE 0.82 04/27/2018   BUN 12 04/27/2018   CO2 28 04/27/2018   TSH 5.40 (H) 04/27/2018   INR 1.01 06/21/2015   HGBA1C 6.8 (H) 04/27/2018   MICROALBUR <0.7 12/28/2014    Lab Results  Component Value Date   TSH 5.40 (H) 04/27/2018   Lab  Results  Component Value Date   WBC 5.9 04/27/2018   HGB 13.1 04/27/2018   HCT 40.2 04/27/2018   MCV 94.6 04/27/2018   PLT 317.0 04/27/2018   Lab Results  Component Value Date   NA 137 04/27/2018   K 5.3 Hemolyzed (H) 04/27/2018  CO2 28 04/27/2018   GLUCOSE 91 04/27/2018   BUN 12 04/27/2018   CREATININE 0.82 04/27/2018   BILITOT 0.5 04/27/2018   ALKPHOS 51 04/27/2018   AST 26 04/27/2018   ALT 19 04/27/2018   PROT 6.7 04/27/2018   ALBUMIN 4.5 04/27/2018   CALCIUM 9.4 04/27/2018   ANIONGAP 9 07/04/2015   GFR 89.04 04/27/2018   Lab Results  Component Value Date   CHOL 185 04/27/2018   Lab Results  Component Value Date   HDL 39.40 04/27/2018   Lab Results  Component Value Date   LDLCALC 118 (H) 04/27/2018   Lab Results  Component Value Date   TRIG 141.0 04/27/2018   Lab Results  Component Value Date   CHOLHDL 5 04/27/2018   Lab Results  Component Value Date   HGBA1C 6.8 (H) 04/27/2018       Assessment & Plan:   Problem List Items Addressed This Visit    Essential hypertension    Well controlled, no changes to meds. Encouraged heart healthy diet such as the DASH diet and exercise as tolerated.       Relevant Orders   CBC   Comprehensive metabolic panel   TSH   Amb ref to Medical Nutrition Therapy-MNT   Hyperlipidemia, mixed   Relevant Orders   Lipid panel   Diabetic neuropathy ([place])    Will refer to nutrition to continue to manage      Osteopenia    Encouraged to get adequate exercise, calcium and vitamin d intake      Hypothyroidism    On Levothyroxine, continue to monitor      Relevant Medications   levothyroxine (SYNTHROID, LEVOTHROID) 88 MCG tablet   Dyspareunia in female    Uses her vaginal suppository with hormone once every week to every 2 weeks. Discussed risk and benefit.       Diabetes mellitus (HCC)    hgba1c acceptable, minimize simple carbs. Increase exercise as tolerated. Continue current meds. Referred to nutrition        Relevant Orders   Hemoglobin A1c   Urine Microalbumin w/creat. ratio   Amb ref to Medical Nutrition Therapy-MNT   Urinary frequency - Primary    Check urinalysis and culture today      Relevant Orders   Urinalysis   Urine Culture   Hyperkalemia    Asymptomatic recheck today      Relevant Orders   Comprehensive metabolic panel   Carpal tunnel syndrome on right    Will ice, apply lidocaine patches bid and splint given splint today         I have discontinued Joycelyn Schmid I. Luscher's levothyroxine. I am also having her start on levothyroxine. Additionally, I am having her maintain her calcium-vitamin D, loteprednol, Ciclopirox, cycloSPORINE, polyvinyl alcohol-povidone, Vitamin D, Probiotic Product (PROBIOTIC DAILY PO), aspirin, DOCOSAHEXAENOIC ACID PO, UNABLE TO FIND, Olopatadine HCl, diclofenac sodium, mometasone, levocetirizine, diltiazem, azelastine, atorvastatin, tretinoin, metFORMIN, metoprolol succinate, glucose blood, YUVAFEM, gabapentin, NEXIUM, AVAPRO, and spironolactone.  Meds ordered this encounter  Medications  . levothyroxine (SYNTHROID, LEVOTHROID) 88 MCG tablet    Sig: Take 1 tablet (88 mcg total) by mouth daily.    Dispense:  90 tablet    Refill:  3     Penni Homans, MD

## 2018-05-04 NOTE — Assessment & Plan Note (Signed)
Uses her vaginal suppository with hormone once every week to every 2 weeks. Discussed risk and benefit.

## 2018-05-04 NOTE — Assessment & Plan Note (Signed)
Well controlled, no changes to meds. Encouraged heart healthy diet such as the DASH diet and exercise as tolerated.  °

## 2018-05-04 NOTE — Patient Instructions (Signed)
Carbohydrate Counting for Diabetes Mellitus, Adult  Carbohydrate counting is a method of keeping track of how many carbohydrates you eat. Eating carbohydrates naturally increases the amount of sugar (glucose) in the blood. Counting how many carbohydrates you eat helps keep your blood glucose within normal limits, which helps you manage your diabetes (diabetes mellitus). It is important to know how many carbohydrates you can safely have in each meal. This is different for every person. A diet and nutrition specialist (registered dietitian) can help you make a meal plan and calculate how many carbohydrates you should have at each meal and snack. Carbohydrates are found in the following foods:  Grains, such as breads and cereals.  Dried beans and soy products.  Starchy vegetables, such as potatoes, peas, and corn.  Fruit and fruit juices.  Milk and yogurt.  Sweets and snack foods, such as cake, cookies, candy, chips, and soft drinks. How do I count carbohydrates? There are two ways to count carbohydrates in food. You can use either of the methods or a combination of both. Reading "Nutrition Facts" on packaged food The "Nutrition Facts" list is included on the labels of almost all packaged foods and beverages in the [place] It includes:  The serving size.  Information about nutrients in each serving, including the grams (g) of carbohydrate per serving. To use the "Nutrition Facts":  Decide how many servings you will have.  Multiply the number of servings by the number of carbohydrates per serving.  The resulting number is the total amount of carbohydrates that you will be having. Learning standard serving sizes of other foods When you eat carbohydrate foods that are not packaged or do not include "Nutrition Facts" on the label, you need to measure the servings in order to count the amount of carbohydrates:  Measure the foods that you will eat with a food scale or measuring cup, if needed.   Decide how many standard-size servings you will eat.  Multiply the number of servings by 15. Most carbohydrate-rich foods have about 15 g of carbohydrates per serving. ? For example, if you eat 8 oz (170 g) of strawberries, you will have eaten 2 servings and 30 g of carbohydrates (2 servings x 15 g = 30 g).  For foods that have more than one food mixed, such as soups and casseroles, you must count the carbohydrates in each food that is included. The following list contains standard serving sizes of common carbohydrate-rich foods. Each of these servings has about 15 g of carbohydrates:   hamburger bun or  English muffin.   oz (15 mL) syrup.   oz (14 g) jelly.  1 slice of bread.  1 six-inch tortilla.  3 oz (85 g) cooked rice or pasta.  4 oz (113 g) cooked dried beans.  4 oz (113 g) starchy vegetable, such as peas, corn, or potatoes.  4 oz (113 g) hot cereal.  4 oz (113 g) mashed potatoes or  of a large baked potato.  4 oz (113 g) canned or frozen fruit.  4 oz (120 mL) fruit juice.  4-6 crackers.  6 chicken nuggets.  6 oz (170 g) unsweetened dry cereal.  6 oz (170 g) plain fat-free yogurt or yogurt sweetened with artificial sweeteners.  8 oz (240 mL) milk.  8 oz (170 g) fresh fruit or one small piece of fruit.  24 oz (680 g) popped popcorn. Example of carbohydrate counting Sample meal  3 oz (85 g) chicken breast.  6 oz (170 g)   brown rice.  4 oz (113 g) corn.  8 oz (240 mL) milk.  8 oz (170 g) strawberries with sugar-free whipped topping. Carbohydrate calculation 1. Identify the foods that contain carbohydrates: ? Rice. ? Corn. ? Milk. ? Strawberries. 2. Calculate how many servings you have of each food: ? 2 servings rice. ? 1 serving corn. ? 1 serving milk. ? 1 serving strawberries. 3. Multiply each number of servings by 15 g: ? 2 servings rice x 15 g = 30 g. ? 1 serving corn x 15 g = 15 g. ? 1 serving milk x 15 g = 15 g. ? 1 serving  strawberries x 15 g = 15 g. 4. Add together all of the amounts to find the total grams of carbohydrates eaten: ? 30 g + 15 g + 15 g + 15 g = 75 g of carbohydrates total. Summary  Carbohydrate counting is a method of keeping track of how many carbohydrates you eat.  Eating carbohydrates naturally increases the amount of sugar (glucose) in the blood.  Counting how many carbohydrates you eat helps keep your blood glucose within normal limits, which helps you manage your diabetes.  A diet and nutrition specialist (registered dietitian) can help you make a meal plan and calculate how many carbohydrates you should have at each meal and snack. This information is not intended to replace advice given to you by your health care provider. Make sure you discuss any questions you have with your health care provider. Document Released: 04/07/2005 Document Revised: 10/15/2016 Document Reviewed: 09/19/2015 Elsevier Interactive Patient Education  2019 [employer]  

## 2018-05-04 NOTE — Assessment & Plan Note (Signed)
Asymptomatic recheck today  

## 2018-05-05 ENCOUNTER — Encounter (INDEPENDENT_AMBULATORY_CARE_PROVIDER_SITE_OTHER): Payer: Self-pay

## 2018-05-05 ENCOUNTER — Ambulatory Visit (INDEPENDENT_AMBULATORY_CARE_PROVIDER_SITE_OTHER): Payer: PRIVATE HEALTH INSURANCE | Admitting: Family Medicine

## 2018-05-05 LAB — URINE CULTURE
MICRO NUMBER:: 52652
SPECIMEN QUALITY:: ADEQUATE

## 2018-05-06 ENCOUNTER — Telehealth: Payer: Self-pay | Admitting: *Deleted

## 2018-05-06 NOTE — Telephone Encounter (Signed)
Received Diabetic Eye Exam Report from [facility] [place]; forwarded to provider/SLS 01/16

## 2018-05-13 ENCOUNTER — Other Ambulatory Visit: Payer: Self-pay | Admitting: Pediatrics

## 2018-05-13 ENCOUNTER — Other Ambulatory Visit: Payer: Self-pay | Admitting: Family Medicine

## 2018-05-16 DIAGNOSIS — E1140 Type 2 diabetes mellitus with diabetic neuropathy, unspecified: Secondary | ICD-10-CM | POA: Diagnosis not present

## 2018-05-17 ENCOUNTER — Encounter: Payer: Medicare Other | Attending: Family Medicine | Admitting: Registered"

## 2018-05-17 ENCOUNTER — Encounter: Payer: Self-pay | Admitting: Registered"

## 2018-05-17 VITALS — Ht 65.0 in | Wt 180.1 lb

## 2018-05-17 DIAGNOSIS — E1165 Type 2 diabetes mellitus with hyperglycemia: Secondary | ICD-10-CM | POA: Diagnosis not present

## 2018-05-17 DIAGNOSIS — E119 Type 2 diabetes mellitus without complications: Secondary | ICD-10-CM | POA: Insufficient documentation

## 2018-05-17 NOTE — Progress Notes (Signed)
Diabetes Self-Management Education  Visit Type: First/Initial  Appt. Start Time: 1030 Appt. End Time: 1140  05/17/2018  Ms. [name], identified by name and date of birth, is a 68 y.o. female with a diagnosis of Diabetes: Type 2.   ASSESSMENT  Height 5\' 5"  (1.651 m), weight 180 lb 1.6 oz (81.7 kg). Body mass index is 29.97 kg/m.  Per chart A1c of 6.8% 2 wks ago is unchanged from 6 mo ago. Pt states she takes Metformin as prescribed 750 mg/ 2x day. Pt is likely misunderstanding how metformin works because reports sugar comes down soon after taking pill. RD may discuss medications in more detail next visit. Pt reports she wants to reduce metformin dose due to size of pill and that ~3x week has some GI side effects from it.  Pt want to get back on track and cut back on sweets. Pt states she feels she needs to take her T2DM diagnosis more seriously.  Pt reported FBG are within expected range. Pt does not check 2 hrs PPBG. Pt reported bedtime BG 80-220 (usually 150-180) 2-3 times per week above 200; Pt stated dinner before her high BG of 220 was fried fish, coleslaw, potatoes and hush puppies. Pt states if BG is below 100 she will have a snack before bed, is afraid of going to bed with low blood sugar. Pt denies having hypoglycemic events.  Pt reports Sundays are hardest to manage meals due to long church services. Pt reports she will pop a candy in her mouth during service if she feels he BG going down.   Pt states after having knee surgery she was going to the gym regularly to ride the bike and use the weight machines. Pt states her husband used to go with her. Pt states since her husbands health issues over the last month she has been too busy to go to the gym.   Pt states she has had low energy, but energy has increased with thyroid medication change.  Diabetes Self-Management Education - 05/17/18 1038      Visit Information   Visit Type  First/Initial      Initial Visit   Diabetes Type  Type 2    Are you currently following a meal plan?  No    Are you taking your medications as prescribed?  Yes   metformin   Date Diagnosed  2008      Health Coping   How would you rate your overall health?  Good      Psychosocial Assessment   Patient Belief/Attitude about Diabetes  Motivated to manage diabetes    How often do you need to have someone help you when you read instructions, pamphlets, or other written materials from your doctor or pharmacy?  1 - Never    What is the last grade level you completed in school?  12      Complications   Last HgB A1C per patient/outside source  6.8 %    How often do you check your blood sugar?  1-2 times/day    Fasting Blood glucose range (mg/dL)  70-129   99-128   Number of hypoglycemic episodes per month  0    Number of hyperglycemic episodes per week  3    Can you tell when your blood sugar is high?  Yes    Have you had a dilated eye exam in the past 12 months?  Yes    Have you had a dental exam in the past 12  months?  Yes    Are you checking your feet?  No      Dietary Intake   Breakfast  cheese toast, apple & PB OR cheerios or oatmeal, boiled egg, sausage OR pancackes, sugar-free syrup, protein    Snack (morning)  none OR candy OR apple OR PB crackers OR ice cream    Lunch  chicken, rice, green beans OR salad sometimes OR loves pork & beans    Snack (afternoon)  none OR something sweet    Dinner  subs OR cracker barrel OR [employer] creamed corn, cabbage, [place] with dressing or liver    Snack (evening)  almonds    Beverage(s)  water, coffee occassionally, diet soda 1x month      Exercise   Exercise Type  Light (walking / raking leaves)   1 mo ago was going to gym 3x week   How many days per week to you exercise?  3    How many minutes per day do you exercise?  10    Total minutes per week of exercise  30      Patient Education   Previous Diabetes Education  No    Nutrition management   Role of diet in the treatment of  diabetes and the relationship between the three main macronutrients and blood glucose level    Physical activity and exercise   Role of exercise on diabetes management, blood pressure control and cardiac health.    Medications  Reviewed patients medication for diabetes, action, purpose, timing of dose and side effects.      Individualized Goals (developed by patient)   Nutrition  General guidelines for healthy choices and portions discussed    Physical Activity  Exercise 3-5 times per week      Outcomes   Expected Outcomes  Demonstrated interest in learning. Expect positive outcomes    Future DMSE  4-6 wks    Program Status  Not Completed      Individualized Plan for Diabetes Self-Management Training:   Learning Objective:  Patient will have a greater understanding of diabetes self-management. Patient education plan is to attend individual and/or group sessions per assessed needs and concerns.  Patient Instructions  Consider checking your feet when you wash them Call your insurance company to see if they provide health coaching calls to help you stay accountable to exercise and eating well. Consider increasing exercise, ultimately 5 times per week for 30 min. Work up to that goal. Editor, commissioning is great for your bones and maintaining muscle. Consider switching your bread to 100% whole grain Remember to always include protein when eating fruit or other carbs Aim to eat balanced meals and consider eating vegetables with 2 meals a day.   Expected Outcomes:  Demonstrated interest in learning. Expect positive outcomes  Education material provided: ADA Diabetes: Where Do I Begin?  If problems or questions, patient to contact team via:  Phone  Future DSME appointment: 4-6 wks

## 2018-05-17 NOTE — Patient Instructions (Addendum)
Consider checking your feet when you wash them Call your insurance company to see if they provide health coaching calls to help you stay accountable to exercise and eating well. Consider increasing exercise, ultimately 5 times per week for 30 min. Work up to that goal. Editor, commissioning is great for your bones and maintaining muscle. Consider switching your bread to 100% whole grain Remember to always include protein when eating fruit or other carbs Aim to eat balanced meals and consider eating vegetables with 2 meals a day.

## 2018-05-19 ENCOUNTER — Ambulatory Visit (INDEPENDENT_AMBULATORY_CARE_PROVIDER_SITE_OTHER): Payer: PRIVATE HEALTH INSURANCE | Admitting: Family Medicine

## 2018-05-24 ENCOUNTER — Other Ambulatory Visit: Payer: Self-pay | Admitting: Pediatrics

## 2018-06-01 ENCOUNTER — Telehealth: Payer: Self-pay

## 2018-06-01 NOTE — Telephone Encounter (Signed)
PA approved from 04/21/2018 to 04/21/2019. However, tier reduction has been denied.

## 2018-06-01 NOTE — Telephone Encounter (Signed)
PA form received from [employer]- form completed and faxed to [phone]. Awaiting determination.

## 2018-06-01 NOTE — Telephone Encounter (Signed)
PA approved through 04/21/2019.  

## 2018-06-07 ENCOUNTER — Ambulatory Visit (INDEPENDENT_AMBULATORY_CARE_PROVIDER_SITE_OTHER): Payer: Medicare Other | Admitting: Obstetrics & Gynecology

## 2018-06-07 ENCOUNTER — Encounter: Payer: Self-pay | Admitting: Obstetrics & Gynecology

## 2018-06-07 VITALS — BP 125/57 | HR 68 | Ht 65.0 in | Wt 175.0 lb

## 2018-06-07 DIAGNOSIS — Z01419 Encounter for gynecological examination (general) (routine) without abnormal findings: Secondary | ICD-10-CM

## 2018-06-07 DIAGNOSIS — N952 Postmenopausal atrophic vaginitis: Secondary | ICD-10-CM

## 2018-06-07 MED ORDER — ESTRADIOL 10 MCG VA TABS: ORAL_TABLET | VAGINAL | 12 refills | Status: DC

## 2018-06-07 NOTE — Progress Notes (Signed)
mammo was in July 2019-normal

## 2018-06-07 NOTE — Progress Notes (Signed)
Subjective:     Victoria Walker is a 68 y.o. female here for a routine exam. G3P3  Current complaints: sx improved on topical EES. Pt brought in 50th wedding anniversary book.    Gynecologic History No LMP recorded. Patient has had a hysterectomy. Contraception: post menopausal status Last Pap: s/p hyst  Last mammogram: 10/21/2017. Results were: normal  Obstetric History OB History  Gravida Para Term Preterm AB Living  3 3 3     3   SAB TAB Ectopic Multiple Live Births               # Outcome Date GA Lbr Len/2nd Weight Sex Delivery Anes PTL Lv  3 Term 51    F Vag-Spont     2 Term 1973    F Vag-Spont     1 Term 1969    M Vag-Spont        The following portions of the patient's history were reviewed and updated as appropriate: allergies, current medications, past family history, past medical history, past social history, past surgical history and problem list.  Review of Systems Pertinent items are noted in HPI.    Objective:  BP (!) 125/57 (BP Location: Left Arm)   Pulse 68   Ht 5\' 5"  (1.651 m)   Wt 175 lb (79.4 kg)   BMI 29.12 kg/m   General Appearance:    Alert, cooperative, no distress, appears stated age  Head:    Normocephalic, without obvious abnormality, atraumatic  Eyes:    conjunctiva/corneas clear, EOM's intact, both eyes  Ears:    Normal external ear canals, both ears  Nose:   Nares normal, septum midline, mucosa normal, no drainage    or sinus tenderness  Throat:   Lips, mucosa, and tongue normal; teeth and gums normal  Neck:   Supple, symmetrical, trachea midline, no adenopathy;    thyroid:  no enlargement/tenderness/nodules  Back:     Symmetric, no curvature, ROM normal, no CVA tenderness  Lungs:     Clear to auscultation bilaterally, respirations unlabored  Chest Wall:    No tenderness or deformity   Heart:    Regular rate and rhythm, S1 and S2 normal, no murmur, rub   or gallop  Breast Exam:    No tenderness, masses, or nipple abnormality; s/p  mastectomy on the right side.   Abdomen:     Soft, non-tender, bowel sounds active all four quadrants,    no masses, no organomegaly  Genitalia:    Normal female without lesion, discharge or tenderness     Extremities:   Extremities normal, atraumatic, no cyanosis or edema  Pulses:   2+ and symmetric all extremities  Skin:   Skin color, texture, turgor normal, no rashes or lesions    Assessment:    Healthy female exam.   Atrophic vaginitis managed with topical EES. Doing well   Plan:    Mammogram ordered. Follow up in: 1 year.    Refilled Estrace cream.   Victoria Walker L. Harraway-Smith, M.D., Victoria Walker

## 2018-06-14 ENCOUNTER — Ambulatory Visit (INDEPENDENT_AMBULATORY_CARE_PROVIDER_SITE_OTHER): Payer: Medicare Other | Admitting: Family Medicine

## 2018-06-14 ENCOUNTER — Encounter: Payer: Self-pay | Admitting: Family Medicine

## 2018-06-14 ENCOUNTER — Ambulatory Visit: Payer: PRIVATE HEALTH INSURANCE | Admitting: Pediatrics

## 2018-06-14 VITALS — BP 126/72 | HR 70 | Temp 98.2°F | Resp 16 | Ht 65.0 in | Wt 178.6 lb

## 2018-06-14 DIAGNOSIS — I10 Essential (primary) hypertension: Secondary | ICD-10-CM | POA: Diagnosis not present

## 2018-06-14 DIAGNOSIS — K219 Gastro-esophageal reflux disease without esophagitis: Secondary | ICD-10-CM | POA: Diagnosis not present

## 2018-06-14 DIAGNOSIS — J3089 Other allergic rhinitis: Secondary | ICD-10-CM | POA: Diagnosis not present

## 2018-06-14 DIAGNOSIS — J302 Other seasonal allergic rhinitis: Secondary | ICD-10-CM | POA: Insufficient documentation

## 2018-06-14 MED ORDER — LEVOCETIRIZINE DIHYDROCHLORIDE 5 MG PO TABS: ORAL_TABLET | ORAL | 3 refills | Status: DC

## 2018-06-14 MED ORDER — MOMETASONE FUROATE 50 MCG/ACT NA SUSP: NASAL | 3 refills | Status: DC

## 2018-06-14 NOTE — Patient Instructions (Addendum)
Allergic rhinitis Continue Nasonex 2 sprays per nostril once a day for stuffy nose Continue Levocetirizine 5 mg-take 1 tablet once a day as needed for runny nose Continue saline nasal rinses as needed for nasal symptoms Continue azelastine 0.1% spray 2 sprays in each nostril twice a day as needed for a runny nose  Call me if you're not doing well on this treatment plan  Follow up in 1 year or sooner if needed

## 2018-06-14 NOTE — Progress Notes (Signed)
[facility] [id number] Dept: [phone]  FOLLOW UP NOTE  Patient ID: Victoria Walker, female    DOB: 1950/11/24  Age: 68 y.o. MRN: [id number] Date of Office Visit: 06/14/2018  Assessment  Chief Complaint: Allergic Rhinitis  (nose itching.)  HPI Victoria Walker is a 68 year old female who presents to the clinic for a follow up visit. She reports that her allergic rhinitis has been moderately well controlled with occasional clear nasal drainage with Nasonex and saline nasal rinses about every other day and levocetirizine 5 mg once a day. Her current medications are listed in the chart.    Drug Allergies:  Allergies  Allergen Reactions  . Codeine Anxiety    Jittery   . Penicillins Itching  . Sulfa Antibiotics Itching  . Vicodin [Hydrocodone-Acetaminophen] Itching    ITCHING     Physical Exam: BP 126/72 (BP Location: Left Arm, Patient Position: Sitting, Cuff Size: Normal)   Pulse 70   Temp 98.2 F (36.8 C) (Oral)   Resp 16   Ht 5\' 5"  (1.651 m)   Wt 178 lb 9.2 oz (81 kg)   SpO2 98%   BMI 29.72 kg/m    Physical Exam Vitals signs reviewed.  Constitutional:      Appearance: Normal appearance.  HENT:     Head: Normocephalic and atraumatic.     Nose:     Comments: Bilateral nares slightly erythematous with no nasal drainage noted. Pharynx slightly erythematous with no exudate noted. Ears normal. Eyes normal. Eyes:     Conjunctiva/sclera: Conjunctivae normal.  Neck:     Musculoskeletal: Normal range of motion and neck supple.  Cardiovascular:     Rate and Rhythm: Normal rate and regular rhythm.     Heart sounds: Normal heart sounds. No murmur.  Pulmonary:     Effort: Pulmonary effort is normal.     Breath sounds: Normal breath sounds.     Comments: Lungs clear to auscultation Musculoskeletal: Normal range of motion.  Skin:    General: Skin is warm and dry.  Neurological:     Mental Status: She is alert and oriented to person, place, and  time.  Psychiatric:        Mood and Affect: Mood normal.        Behavior: Behavior normal.        Thought Content: Thought content normal.        Judgment: Judgment normal.      Assessment and Plan: 1. Other allergic rhinitis   2. Gastroesophageal reflux disease without esophagitis   3. Essential hypertension     Meds ordered this encounter  Medications  . mometasone (NASONEX) 50 MCG/ACT nasal spray    Sig: Two sprays each nostril once a day if needed for nasal congestion or drainage.    Dispense:  51 g    Refill:  3    Brand name.  Dispense 90 days supply  . levocetirizine (XYZAL) 5 MG tablet    Sig: One tablet once a day as needed for runny nose.    Dispense:  90 tablet    Refill:  3    Patient Instructions  Allergic rhinitis Continue Nasonex 2 sprays per nostril once a day for stuffy nose Continue Levocetirizine 5 mg-take 1 tablet once a day as needed for runny nose Continue saline nasal rinses as needed for nasal symptoms Continue azelastine 0.1% spray 2 sprays in each nostril twice a day as needed for a runny nose  Call  me if you're not doing well on this treatment plan  Follow up in 1 year or sooner if needed   Return in about 1 year (around 06/15/2019), or if symptoms worsen or fail to improve.   Thank you for the opportunity to care for this patient.  Please do not hesitate to contact me with questions.  Gareth Morgan, FNP [facility]  I have provided oversight concerning Gareth Morgan' evaluation and treatment of this patient's health issues addressed during today's encounter. I agree with the assessment and therapeutic plan as outlined in the note.   Thank you for the opportunity to care for this patient.  Please do not hesitate to contact me with questions.  Penne Lash, M.D.  [facility] [address] [place],  [address] [phone]

## 2018-06-15 ENCOUNTER — Other Ambulatory Visit: Payer: Self-pay | Admitting: Family Medicine

## 2018-06-15 DIAGNOSIS — K219 Gastro-esophageal reflux disease without esophagitis: Secondary | ICD-10-CM

## 2018-06-15 DIAGNOSIS — I10 Essential (primary) hypertension: Secondary | ICD-10-CM

## 2018-06-15 MED ORDER — AVAPRO 150 MG PO TABS: ORAL_TABLET | ORAL | 0 refills | Status: DC

## 2018-06-15 MED ORDER — NEXIUM 40 MG PO CPDR: DELAYED_RELEASE_CAPSULE | ORAL | 0 refills | Status: DC

## 2018-06-15 NOTE — Telephone Encounter (Signed)
Requested Prescriptions  Pending Prescriptions Disp Refills  . NEXIUM 40 MG capsule 90 capsule 0    Sig: TAKE 1 CAPSULE BY MOUTH IN THE MORNING     Gastroenterology: Proton Pump Inhibitors Passed - 06/15/2018  8:45 AM      Passed - Valid encounter within last 12 months    Recent Outpatient Visits          1 month ago Urinary frequency   [facility] [place], [place]   7 months ago Preventative health care   [facility] [place], [place]   9 months ago Cough   [facility] at [place], [place]   1 year ago Frequent urination   [facility] [place], [place]   1 year ago Postmenopausal   [facility] at [place], [place]      Future Appointments            In 1 month Mosie Lukes, MD [place] [facility] [place], [place]   In 5 months Mosie Lukes, MD [facility] [place], [place]   In 9 months [place], Parthenia Ames, Research scientist (physical sciences) at [employer], [place]   In 12 months Bardelas, Jens Som, MD [facility] [place]         . AVAPRO 150 MG tablet 90 tablet 0     Cardiovascular:  Angiotensin Receptor Blockers Passed - 06/15/2018  8:45 AM      Passed - Cr in normal range and within 180 days    Creat  Date Value Ref Range Status  05/02/2014 0.72 0.50 - 1.10 mg/dL Final   Creatinine, Ser  Date Value Ref Range Status  05/04/2018 0.98 0.40 - 1.20 mg/dL Final         Passed - K in normal range and within 180 days    Potassium  Date Value Ref Range Status  05/04/2018 5.1 3.5 - 5.1 mEq/L Final         Passed - Patient is not pregnant      Passed - Last BP in normal range    BP Readings from Last 1 Encounters:  06/14/18 126/72         Passed - Valid encounter within last 6  months    Recent Outpatient Visits          1 month ago Urinary frequency   [facility] [place], [place]   7 months ago Preventative health care   [place] [facility] [place], [place]   9 months ago Cough   [facility] at [place], [place]   1 year ago Frequent urination   [facility] at [place], [place]   1 year ago Postmenopausal   [facility] at [place], [place]      Future Appointments            In 1 month Charlett Blake, Bonnita Levan, MD [place] [facility] [place], [place]   In 5 months Mosie Lukes, MD [place] at [facility], [place]   In  9 months Dennis Bast, RN [employer] at [facility], [place]   In 12 months Bardelas, Jens Som, MD [facility] [place] [employer]

## 2018-06-15 NOTE — Telephone Encounter (Signed)
Copied from [place] [id number]. Topic: Quick Communication - Rx Refill/Question >> Jun 15, 2018  8:42 AM Alanda Slim E wrote: Medication: AVAPRO 150 MG tablet NEXIUM 40 MG capsule  Has the patient contacted their pharmacy? Yes    Preferred Pharmacy (with phone number or street name): [place], [place] - [place] [phone] (Phone) [phone] (Fax)    Agent: Please be advised that RX refills may take up to 3 business days. We ask that you follow-up with your pharmacy.

## 2018-06-28 ENCOUNTER — Encounter: Payer: Medicare Other | Attending: Family Medicine | Admitting: Registered"

## 2018-06-28 ENCOUNTER — Encounter: Payer: Self-pay | Admitting: Registered"

## 2018-06-28 VITALS — Ht 65.0 in | Wt 176.7 lb

## 2018-06-28 DIAGNOSIS — E1165 Type 2 diabetes mellitus with hyperglycemia: Secondary | ICD-10-CM | POA: Insufficient documentation

## 2018-06-28 DIAGNOSIS — E119 Type 2 diabetes mellitus without complications: Secondary | ICD-10-CM

## 2018-06-28 NOTE — Progress Notes (Signed)
Diabetes Self-Management Education  Visit Type: Follow-up  Appt. Start Time: 0930 Appt. End Time: 1005  06/28/2018  [name], identified by name and date of birth, is a 68 y.o. female with a diagnosis of Diabetes: Type 2.   ASSESSMENT  Height 5\' 5"  (1.651 m), weight 176 lb 11.2 oz (80.2 kg). Body mass index is 29.4 kg/m.  Pt states since last visit she has increased her vegetable intake and feels better. Pt states she has remembered to eat protein with fruit and switched to 100% whole grain bread. Pt states she feels she is getting worse about eating sweets.  Pt states she had a goal of exercising 5x week but walking more than 4x week hurts her knee and thinks she needs to get another shot. Pt reports she has resistance bands from when she was doing physical therapy.   Pt states she continues to be concerned when her bedtime BG is 97- low 100s and still believes the metformin is dropping her BG too low. Pt states eating appropriate snacks before bed. Patient checks morning FBG and before bed - typically 3-4 hours after dinner.  Pt states she did not have breakfast before coming to appointment due to metformin side effect of BM urgency, but not diarrhea. Pt states she does not skip doses of metformin but will take only when she is near a bathroom.   Diabetes Self-Management Education - 06/28/18 0949      Visit Information   Visit Type  Follow-up      Initial Visit   Diabetes Type  Type 2    Are you taking your medications as prescribed?  Yes   metformin with side effects     Complications   Fasting Blood glucose range (mg/dL)  70-129   97-120 usually low 100s rarely 130   Number of hypoglycemic episodes per month  0      Exercise   Exercise Type  Light (walking / raking leaves)    How many days per week to you exercise?  3    How many minutes per day do you exercise?  15    Total minutes per week of exercise  45      Patient Education   Nutrition management    Role of diet in the treatment of diabetes and the relationship between the three main macronutrients and blood glucose level    Physical activity and exercise   Helped patient identify appropriate exercises in relation to his/her diabetes, diabetes complications and other health issue.    Medications  Reviewed patients medication for diabetes, action, purpose, timing of dose and side effects.      Individualized Goals (developed by patient)   Nutrition  General guidelines for healthy choices and portions discussed    Physical Activity  Exercise 3-5 times per week    Medications  Other (comment)   discuss side effects with MD   Monitoring   test my blood glucose as discussed      Patient Self-Evaluation of Goals - Patient rates self as meeting previously set goals (% of time)   Nutrition  >75%    Physical Activity  50 - 75 %      Outcomes   Expected Outcomes  Demonstrated interest in learning. Expect positive outcomes    Future DMSE  2 months    Program Status  Not Completed      Subsequent Visit   Since your last visit have you continued or begun to take  your medications as prescribed?  Yes    Since your last visit have you experienced any weight changes?  Loss    Weight Loss (lbs)  3    Since your last visit, are you checking your blood glucose at least once a day?  Yes       Individualized Plan for Diabetes Self-Management Training:   Learning Objective:  Patient will have a greater understanding of diabetes self-management. Patient education plan is to attend individual and/or group sessions per assessed needs and concerns.    Patient Instructions  Doristine Devoid job on increasing your vegetable intake and having protein with fruit Continue using the plate method for balanced meals Your blood sugar at night is okay, but a snack before bed is fine too Because you are still having to use the bathroom after taking metformin, talk to your doctor to see if they want to reduce your dose or  try another medication.  Continue with your plan to work up to 3-4 times week exercise. You can do other things besides walking since it bothers your knees. Your bike and resistance bands for other exercise options.   Expected Outcomes:  Demonstrated interest in learning. Expect positive outcomes  Education material provided: none  If problems or questions, patient to contact team via:  Phone and MyChart  Future DSME appointment: 2 months

## 2018-06-28 NOTE — Patient Instructions (Signed)
Great job on increasing your vegetable intake and having protein with fruit Continue using the plate method for balanced meals Your blood sugar at night is okay, but a snack before bed is fine too Because you are still having to use the bathroom after taking metformin, talk to your doctor to see if they want to reduce your dose or try another medication.  Continue with your plan to work up to 3-4 times week exercise. You can do other things besides walking since it bothers your knees. Your bike and resistance bands for other exercise options.

## 2018-07-08 DIAGNOSIS — M25561 Pain in right knee: Secondary | ICD-10-CM | POA: Diagnosis not present

## 2018-07-08 DIAGNOSIS — M1712 Unilateral primary osteoarthritis, left knee: Secondary | ICD-10-CM | POA: Diagnosis not present

## 2018-07-09 ENCOUNTER — Other Ambulatory Visit: Payer: Self-pay | Admitting: Family Medicine

## 2018-07-26 ENCOUNTER — Telehealth: Payer: Self-pay | Admitting: *Deleted

## 2018-07-26 NOTE — Telephone Encounter (Signed)
Received Physician Orders from [place]; forwarded to provider/SLS 04/06

## 2018-07-29 ENCOUNTER — Encounter: Payer: Self-pay | Admitting: Family Medicine

## 2018-07-29 ENCOUNTER — Other Ambulatory Visit: Payer: Self-pay

## 2018-07-29 DIAGNOSIS — R350 Frequency of micturition: Secondary | ICD-10-CM

## 2018-07-29 NOTE — Telephone Encounter (Signed)
Pt states she has been having some burning / achy bladder after urinating and vaginal itching. Please add orders for Monday lab appt if appropriate.

## 2018-08-01 ENCOUNTER — Other Ambulatory Visit: Payer: Self-pay | Admitting: Medical

## 2018-08-02 ENCOUNTER — Other Ambulatory Visit: Payer: Self-pay

## 2018-08-02 ENCOUNTER — Other Ambulatory Visit (INDEPENDENT_AMBULATORY_CARE_PROVIDER_SITE_OTHER): Payer: Medicare Other

## 2018-08-02 ENCOUNTER — Other Ambulatory Visit (HOSPITAL_COMMUNITY)
Admission: RE | Admit: 2018-08-02 | Discharge: 2018-08-02 | Disposition: A | Discharge status: Home / Self Care | Payer: Medicare Other | Source: Ambulatory Visit | Attending: Family Medicine | Admitting: Family Medicine

## 2018-08-02 DIAGNOSIS — I10 Essential (primary) hypertension: Secondary | ICD-10-CM

## 2018-08-02 DIAGNOSIS — R350 Frequency of micturition: Secondary | ICD-10-CM | POA: Insufficient documentation

## 2018-08-02 DIAGNOSIS — E1169 Type 2 diabetes mellitus with other specified complication: Secondary | ICD-10-CM | POA: Diagnosis not present

## 2018-08-02 DIAGNOSIS — E782 Mixed hyperlipidemia: Secondary | ICD-10-CM

## 2018-08-02 LAB — CBC
HCT: 40.9 % (ref 36.0–46.0)
Hemoglobin: 13.5 g/dL (ref 12.0–15.0)
MCHC: 33 g/dL (ref 30.0–36.0)
MCV: 93.2 fl (ref 78.0–100.0)
Platelets: 256 10*3/uL (ref 150.0–400.0)
RBC: 4.38 Mil/uL (ref 3.87–5.11)
RDW: 13.8 % (ref 11.5–15.5)
WBC: 6.8 10*3/uL (ref 4.0–10.5)

## 2018-08-02 LAB — COMPREHENSIVE METABOLIC PANEL
ALT: 21 U/L (ref 0–35)
AST: 15 U/L (ref 0–37)
Albumin: 4.4 g/dL (ref 3.5–5.2)
Alkaline Phosphatase: 54 U/L (ref 39–117)
BUN: 21 mg/dL (ref 6–23)
CO2: 29 mEq/L (ref 19–32)
Calcium: 9.7 mg/dL (ref 8.4–10.5)
Chloride: 100 mEq/L (ref 96–112)
Creatinine, Ser: 0.82 mg/dL (ref 0.40–1.20)
GFR: 83.71 mL/min (ref >60.00)
Glucose, Bld: 90 mg/dL (ref 70–99)
Potassium: 5.2 mEq/L — ABNORMAL HIGH (ref 3.5–5.1)
Sodium: 137 mEq/L (ref 135–145)
Total Bilirubin: 0.4 mg/dL (ref 0.2–1.2)
Total Protein: 6.8 g/dL (ref 6.0–8.3)

## 2018-08-02 LAB — LIPID PANEL
Cholesterol: 157 mg/dL (ref 0–200)
HDL: 53.4 mg/dL (ref >39.00)
LDL Cholesterol: 87 mg/dL (ref 0–99)
NonHDL: 103.44
Total CHOL/HDL Ratio: 3
Triglycerides: 80 mg/dL (ref 0.0–149.0)
VLDL: 16 mg/dL (ref 0.0–40.0)

## 2018-08-02 LAB — URINALYSIS, ROUTINE W REFLEX MICROSCOPIC
Bilirubin Urine: NEGATIVE
Hgb urine dipstick: NEGATIVE
Ketones, ur: NEGATIVE
Nitrite: NEGATIVE
Specific Gravity, Urine: 1.01 (ref 1.000–1.030)
Total Protein, Urine: NEGATIVE
Urine Glucose: NEGATIVE
Urobilinogen, UA: 0.2 (ref 0.0–1.0)
pH: 6 (ref 5.0–8.0)

## 2018-08-02 LAB — MICROALBUMIN / CREATININE URINE RATIO
Creatinine,U: 95 mg/dL
Microalb Creat Ratio: 0.7 mg/g (ref 0.0–30.0)
Microalb, Ur: <0.7 mg/dL (ref 0.0–1.9)

## 2018-08-02 LAB — TSH: TSH: 0.14 u[IU]/mL — ABNORMAL LOW (ref 0.35–4.50)

## 2018-08-02 LAB — HEMOGLOBIN A1C: Hgb A1c MFr Bld: 6.9 % — ABNORMAL HIGH (ref 4.6–6.5)

## 2018-08-03 LAB — URINE CULTURE
MICRO NUMBER:: 391277
Result:: NO GROWTH
SPECIMEN QUALITY:: ADEQUATE

## 2018-08-05 ENCOUNTER — Ambulatory Visit (INDEPENDENT_AMBULATORY_CARE_PROVIDER_SITE_OTHER): Payer: Medicare Other | Admitting: Family Medicine

## 2018-08-05 ENCOUNTER — Other Ambulatory Visit: Payer: Self-pay

## 2018-08-05 DIAGNOSIS — E119 Type 2 diabetes mellitus without complications: Secondary | ICD-10-CM

## 2018-08-05 DIAGNOSIS — E039 Hypothyroidism, unspecified: Secondary | ICD-10-CM | POA: Diagnosis not present

## 2018-08-05 DIAGNOSIS — I10 Essential (primary) hypertension: Secondary | ICD-10-CM

## 2018-08-05 DIAGNOSIS — K219 Gastro-esophageal reflux disease without esophagitis: Secondary | ICD-10-CM

## 2018-08-05 DIAGNOSIS — E782 Mixed hyperlipidemia: Secondary | ICD-10-CM | POA: Diagnosis not present

## 2018-08-05 MED ORDER — LEVOTHYROXINE SODIUM 88 MCG PO TABS: ORAL_TABLET | ORAL | 3 refills | Status: DC

## 2018-08-05 NOTE — Patient Instructions (Signed)
Vitamin C 1000 mg twice daily Zinc 50 mg daily 5 deep breaths every hour hold to a count of 5  Check BP, pulse and blood sugar every Sunday and record  Do not take Levothyroxine every Sunday

## 2018-08-05 NOTE — Assessment & Plan Note (Signed)
TSH now mildly suppressed will stay on Levothyroxine 88 mcg daily except skip saturday

## 2018-08-05 NOTE — Assessment & Plan Note (Signed)
hgba1c acceptable, minimize simple carbs. Increase exercise as tolerated. Continue current meds 

## 2018-08-05 NOTE — Assessment & Plan Note (Addendum)
Encouraged heart healthy diet, increase exercise, avoid trans fats, consider a krill oil cap daily. Tolerating Statin

## 2018-08-05 NOTE — Assessment & Plan Note (Signed)
Patient encouraged to monitor and report any concerns.

## 2018-08-08 NOTE — Assessment & Plan Note (Addendum)
Avoid offending foods, start probiotics. Do not eat large meals in late evening and consider raising head of bed. Has fleeting abdominal discomfort that only lasts seconds and is sharp and random. Happens maybe 2 x a week. Likely colonic spasm she will let us know if worsens.

## 2018-08-08 NOTE — Progress Notes (Signed)
Virtual Visit via Video Note  I connected with Orlene Plum on 08/08/18 at  9:40 AM EDT by a video enabled telemedicine application and verified that I am speaking with the correct person using two identifiers.   I discussed the limitations of evaluation and management by telemedicine and the availability of in person appointments. The patient expressed understanding and agreed to proceed. Princess Eulas Post CMA was able to get the patient set up on the video platform    Subjective:    Patient ID: Victoria Walker, female    DOB: 1950/10/31, 68 y.o.   MRN: [id number]  No chief complaint on file.   HPI Patient is in today for follow up on chronic medical concerns including diabetes, hypertension, hypothyroidism, hyperlipidemia, reflux and more. She reports her BP and pulse have generally  been good. Pulse was up slightly this am but that is ususual. No recent febrile illness or hospitalizations. No polyuria or polydipsia. Denies CP/palp/SOB/HA/congestion/fevers or GU c/o. Taking meds as prescribed  Past Medical History:  Diagnosis Date  . Arthritis    "both knees" (07/02/2015)  . Cancer of right breast ([place])   . Claustrophobia   . Dysrhythmia    HX OF FAST HEART RATE AND PALPITATIONS - METOPROLOL HAS HELPED  . GERD (gastroesophageal reflux disease)   . H/O hiatal hernia   . H/O iritis    LEFT EYE - STATES HER EYE BECOMES RED AND VERY SENSITIVE TO LIGHT WHEN FLARE UP OF IRITIS  . Heart murmur    "benign; 06/2015"  . High cholesterol   . History of blood transfusion    "when I had my partial hysterectomy"  . History of chickenpox 09/26/2014  . History of measles   . History of MRSA infection   . History of shingles   . Hypertension   . Hypothyroidism   . Medicare annual wellness visit, initial 04/07/2015  . Multinodular thyroid    PT HAVING TROUBLE SWALLOWING  . Neuropathy    FEET  . Primary localized osteoarthritis of right knee   . Sciatica of right side 04/25/2015  .  Spinal headache    spinal headache with epidural  . Total knee replacement status, right   . Type II diabetes mellitus (HCC)    ORAL MEDICATION - NO INSULIN  . Wears glasses     Past Surgical History:  Procedure Laterality Date  . ABDOMINAL HYSTERECTOMY  1988  . ABDOMINOPLASTY  1986  . ANTERIOR AND POSTERIOR REPAIR  05/11/2012   Procedure: ANTERIOR (CYSTOCELE) AND POSTERIOR REPAIR (RECTOCELE);  Surgeon: Reece Packer, MD;  Location: [facility];  Service: Urology;;  with graft  . BREAST BIOPSY Right   . CARDIAC CATHETERIZATION  1999  . COLONOSCOPY    . CYSTOSCOPY WITH URETHRAL DILATATION  05/11/2012   Procedure: CYSTOSCOPY WITH URETHRAL DILATATION;  Surgeon: Reece Packer, MD;  Location: [facility];  Service: Urology;;  . ESOPHAGOGASTRODUODENOSCOPY    . HERNIA REPAIR    . MASTECTOMY Right 2007  . PLANTAR FASCIA SURGERY Left   . THYROIDECTOMY N/A 02/23/2014   Procedure: TOTAL THYROIDECTOMY;  Surgeon: Armandina Gemma, MD;  Location: [facility];  Service: General;  Laterality: N/A;  . TOTAL KNEE ARTHROPLASTY Right 07/02/2015  . TOTAL KNEE ARTHROPLASTY Right 07/02/2015   Procedure: TOTAL KNEE ARTHROPLASTY;  Surgeon: Elsie Saas, MD;  Location: [facility];  Service: Orthopedics;  Laterality: Right;  . TOTAL THYROIDECTOMY  1976   removed three tumor (2 on right; 1 on left)  .  TUBAL LIGATION    . UMBILICAL HERNIA REPAIR  1986  . VAGINAL PROLAPSE REPAIR  05/11/2012   Procedure: VAGINAL VAULT SUSPENSION;  Surgeon: Reece Packer, MD;  Location: [facility];  Service: Urology;;  . VULVA / PERINEUM BIOPSY     vulvar    Family History  Problem Relation Age of Onset  . Hypertension Mother   . Heart disease Mother        pacer, CHF  . Hyperlipidemia Mother   . Kidney disease Mother        1 kidney due to stone  . Kidney Stones Mother   . Hypertension Father   . Cancer Father        lung, psa  . Diabetes Father   . Alcohol abuse Father   . Diabetes Sister   . Cancer Sister   . Hypertension  Sister   . Diabetes Brother   . Hypertension Brother   . Hyperlipidemia Brother   . Kidney disease Brother        dialysis  . Stroke Brother   . Hypertension Maternal Grandmother   . Heart disease Maternal Grandmother   . Stroke Maternal Grandmother   . Hypertension Maternal Grandfather   . Hypertension Daughter   . Arthritis Son   . Hyperlipidemia Sister   . Hypertension Sister   . Diabetes Sister   . Thyroid disease Sister   . Kidney disease Sister   . Cancer Sister        thyroid and breast  . Arthritis Sister        back pain  . Hypertension Sister   . Thyroid disease Sister   . Hypertension Brother   . Hyperlipidemia Brother   . Hypertension Brother   . Hyperlipidemia Brother   . Benign prostatic hyperplasia Brother   . Hypertension Daughter   . Hyperlipidemia Daughter   . Diabetes Daughter   . Hypertension Maternal Aunt   . Hypertension Maternal Uncle   . Hypertension Paternal Aunt   . Cancer Paternal Aunt   . Hypertension Paternal Uncle     Social History   Socioeconomic History  . Marital status: Married    Spouse name: Not on file  . Number of children: 3  . Years of education: Not on file  . Highest education level: Not on file  Occupational History    Comment: Retired  Scientific laboratory technician  . Financial resource strain: Not on file  . Food insecurity:    Worry: Not on file    Inability: Not on file  . Transportation needs:    Medical: Not on file    Non-medical: Not on file  Tobacco Use  . Smoking status: Never Smoker  . Smokeless tobacco: Never Used  Substance and Sexual Activity  . Alcohol use: No    Alcohol/week: 0.0 standard drinks  . Drug use: No  . Sexual activity: Not Currently    Birth control/protection: Surgical    Comment: lives with husband, retired from various jobs, diabetic diet  Lifestyle  . Physical activity:    Days per week: Not on file    Minutes per session: Not on file  . Stress: Not on file  Relationships  . Social  connections:    Talks on phone: Not on file    Gets together: Not on file    Attends religious service: Not on file    Active member of club or organization: Not on file    Attends meetings of clubs or  organizations: Not on file    Relationship status: Not on file  . Intimate partner violence:    Fear of current or ex partner: Not on file    Emotionally abused: Not on file    Physically abused: Not on file    Forced sexual activity: Not on file  Other Topics Concern  . Not on file  Social History Narrative  . Not on file    Outpatient Medications Prior to Visit  Medication Sig Dispense Refill  . aspirin 81 MG EC tablet Take 81 mg by mouth every other day. Swallow whole.    Marland Kitchen atorvastatin (LIPITOR) 40 MG tablet 40 mg QOD alternating with 20 mg qod 66 tablet 3  . AVAPRO 150 MG tablet TAKE 1/2 TO 1 (ONE-HALF TO ONE) TABLET BY MOUTH ONCE DAILY 90 tablet 0  . azelastine (ASTELIN) 0.1 % nasal spray Place 2 sprays into both nostrils 2 (two) times daily. Use in each nostril as directed 30 mL 12  . calcium-vitamin D (OSCAL WITH D) 250-125 MG-UNIT per tablet Take 1 tablet by mouth daily.     . Cholecalciferol (VITAMIN D) 2000 UNITS CAPS Take 1 capsule by mouth every evening.     . Ciclopirox 1 % shampoo Apply 1 each topically every Friday.     . clindamycin (CLEOCIN) 300 MG capsule Take 300 mg by mouth daily as needed (one hour before dental work).     . cycloSPORINE (RESTASIS) 0.05 % ophthalmic emulsion Place 1 drop into both eyes 2 (two) times daily.    Marland Kitchen diltiazem (TIAZAC) 360 MG 24 hr capsule TAKE 1 CAPSULE (360 MG TOTAL) BY MOUTH EVERY MORNING. 90 capsule 3  . DOCOSAHEXAENOIC ACID PO Take by mouth.    . Estradiol (YUVAFEM) 10 MCG TABS vaginal tablet INSERT 1 TAB VAGINALLY AT BEDTIME EVERY OTHER DAY 16 tablet 12  . gabapentin (NEURONTIN) 300 MG capsule TAKE 1 CAPSULE (300 MG TOTAL) BY MOUTH AT BEDTIME. 90 capsule 2  . glucose blood (ONE TOUCH TEST STRIPS) test strip Use as directed twice  daily to check blood sugar. DX code E11.9 300 each 1  . levocetirizine (XYZAL) 5 MG tablet One tablet once a day as needed for runny nose. 90 tablet 3  . loteprednol (LOTEMAX) 0.5 % ophthalmic suspension Place 1 drop into the left eye daily as needed ("arthiritis of the eye").     . metFORMIN (GLUCOPHAGE-XR) 750 MG 24 hr tablet TAKE 1 TABLET IN AM AND 1 TABLET AT LUNCH 180 tablet 1  . metoprolol succinate (TOPROL-XL) 25 MG 24 hr tablet TAKE 1 TABLET BY MOUTH TWICE A DAY AT 12 NOON AND 4PM 180 tablet 1  . mometasone (NASONEX) 50 MCG/ACT nasal spray Two sprays each nostril once a day if needed for nasal congestion or drainage. 51 g 3  . NEXIUM 40 MG capsule TAKE 1 CAPSULE BY MOUTH IN THE MORNING 90 capsule 0  . polyvinyl alcohol-povidone (REFRESH) 1.4-0.6 % ophthalmic solution Place 1 drop into both eyes 2 (two) times daily.    . Probiotic Product (PROBIOTIC DAILY PO) Take 1 capsule by mouth daily.    Marland Kitchen spironolactone (ALDACTONE) 25 MG tablet TAEK 1 & 1/2 TAB BY MOUTH EVERY DAY 135 tablet 1  . tretinoin (RETIN-A) 0.025 % cream APPLY 1 APPLICATION TOPICALLY EVERY DAY  11  . levothyroxine (SYNTHROID, LEVOTHROID) 88 MCG tablet Take 1 tablet (88 mcg total) by mouth daily. 90 tablet 3   No facility-administered medications prior to visit.  Allergies  Allergen Reactions  . Codeine Anxiety    Jittery   . Penicillins Itching  . Sulfa Antibiotics Itching  . Vicodin [Hydrocodone-Acetaminophen] Itching    ITCHING     Review of Systems  Constitutional: Negative for fever and malaise/fatigue.  HENT: Negative for congestion.   Eyes: Negative for blurred vision.  Respiratory: Negative for shortness of breath.   Cardiovascular: Negative for chest pain, palpitations and leg swelling.  Gastrointestinal: Negative for abdominal pain, blood in stool and nausea.  Genitourinary: Negative for dysuria and frequency.  Musculoskeletal: Negative for falls.  Skin: Negative for rash.  Neurological: Negative  for dizziness, loss of consciousness and headaches.  Endo/Heme/Allergies: Negative for environmental allergies.  Psychiatric/Behavioral: Negative for depression. The patient is not nervous/anxious.        Objective:    Physical Exam Vitals signs reviewed.  Constitutional:      Appearance: Normal appearance.  HENT:     Head: Normocephalic and atraumatic.     Nose: Nose normal.  Pulmonary:     Effort: Pulmonary effort is normal.  Skin:    General: Skin is dry.  Neurological:     Mental Status: She is alert and oriented to person, place, and time.  Psychiatric:        Mood and Affect: Mood normal.        Behavior: Behavior normal.     BP 121/67   Pulse 100   Wt 174 lb (78.9 kg)   BMI 28.96 kg/m  Wt Readings from Last 3 Encounters:  08/05/18 174 lb (78.9 kg)  06/28/18 176 lb 11.2 oz (80.2 kg)  06/14/18 178 lb 9.2 oz (81 kg)    Diabetic Foot Exam - Simple   No data filed     Lab Results  Component Value Date   WBC 6.8 08/02/2018   HGB 13.5 08/02/2018   HCT 40.9 08/02/2018   PLT 256.0 08/02/2018   GLUCOSE 90 08/02/2018   CHOL 157 08/02/2018   TRIG 80.0 08/02/2018   HDL 53.40 08/02/2018   LDLDIRECT 107.0 07/01/2016   LDLCALC 87 08/02/2018   ALT 21 08/02/2018   AST 15 08/02/2018   NA 137 08/02/2018   K 5.2 (H) 08/02/2018   CL 100 08/02/2018   CREATININE 0.82 08/02/2018   BUN 21 08/02/2018   CO2 29 08/02/2018   TSH 0.14 (L) 08/02/2018   INR 1.01 06/21/2015   HGBA1C 6.9 (H) 08/02/2018   MICROALBUR <0.7 08/02/2018    Lab Results  Component Value Date   TSH 0.14 (L) 08/02/2018   Lab Results  Component Value Date   WBC 6.8 08/02/2018   HGB 13.5 08/02/2018   HCT 40.9 08/02/2018   MCV 93.2 08/02/2018   PLT 256.0 08/02/2018   Lab Results  Component Value Date   NA 137 08/02/2018   K 5.2 (H) 08/02/2018   CO2 29 08/02/2018   GLUCOSE 90 08/02/2018   BUN 21 08/02/2018   CREATININE 0.82 08/02/2018   BILITOT 0.4 08/02/2018   ALKPHOS 54 08/02/2018    AST 15 08/02/2018   ALT 21 08/02/2018   PROT 6.8 08/02/2018   ALBUMIN 4.4 08/02/2018   CALCIUM 9.7 08/02/2018   ANIONGAP 9 07/04/2015   GFR 83.71 08/02/2018   Lab Results  Component Value Date   CHOL 157 08/02/2018   Lab Results  Component Value Date   HDL 53.40 08/02/2018   Lab Results  Component Value Date   LDLCALC 87 08/02/2018   Lab Results  Component  Value Date   TRIG 80.0 08/02/2018   Lab Results  Component Value Date   CHOLHDL 3 08/02/2018   Lab Results  Component Value Date   HGBA1C 6.9 (H) 08/02/2018       Assessment & Plan:   Problem List Items Addressed This Visit    Essential hypertension    Patient encouraged to monitor and report any concerns.       Hyperlipidemia, mixed    Encouraged heart healthy diet, increase exercise, avoid trans fats, consider a krill oil cap daily. Tolerating Statin      Hypothyroid    TSH now mildly suppressed will stay on Levothyroxine 88 mcg daily except skip saturday      Relevant Medications   levothyroxine (SYNTHROID, LEVOTHROID) 88 MCG tablet   Gastroesophageal reflux disease without esophagitis    Avoid offending foods, start probiotics. Do not eat large meals in late evening and consider raising head of bed. Has fleeting abdominal discomfort that only lasts seconds and is sharp and random. Happens maybe 2 x a week. Likely colonic spasm she will let us know if worsens.       Diabetes mellitus without complication (HCC)    [id number] acceptable, minimize simple carbs. Increase exercise as tolerated. Continue current meds         I have changed Joycelyn Schmid I. Olberding's levothyroxine. I am also having her maintain her calcium-vitamin D, loteprednol, Ciclopirox, cycloSPORINE, polyvinyl alcohol-povidone, Vitamin D, Probiotic Product (PROBIOTIC DAILY PO), aspirin, DOCOSAHEXAENOIC ACID PO, atorvastatin, tretinoin, glucose blood, gabapentin, spironolactone, metoprolol succinate, metFORMIN, Estradiol, clindamycin,  mometasone, levocetirizine, NexIUM, Avapro, diltiazem, and azelastine.  Meds ordered this encounter  Medications  . levothyroxine (SYNTHROID, LEVOTHROID) 88 MCG tablet    Sig: 1 tab po daily except skip Sunday    Dispense:  90 tablet    Refill:  3    I discussed the assessment and treatment plan with the patient. The patient was provided an opportunity to ask questions and all were answered. The patient agreed with the plan and demonstrated an understanding of the instructions.   The patient was advised to call back or seek an in-person evaluation if the symptoms worsen or if the condition fails to improve as anticipated.  I provided 25 minutes of non-face-to-face time during this encounter.   Penni Homans, MD

## 2018-08-17 DIAGNOSIS — E1140 Type 2 diabetes mellitus with diabetic neuropathy, unspecified: Secondary | ICD-10-CM | POA: Diagnosis not present

## 2018-08-18 DIAGNOSIS — Z9011 Acquired absence of right breast and nipple: Secondary | ICD-10-CM | POA: Diagnosis not present

## 2018-09-06 ENCOUNTER — Ambulatory Visit: Payer: Medicare Other | Admitting: Registered"

## 2018-10-01 ENCOUNTER — Other Ambulatory Visit: Payer: Self-pay | Admitting: Family Medicine

## 2018-10-02 ENCOUNTER — Encounter: Payer: Self-pay | Admitting: Family Medicine

## 2018-10-02 ENCOUNTER — Other Ambulatory Visit: Payer: Self-pay | Admitting: Family Medicine

## 2018-10-02 DIAGNOSIS — K219 Gastro-esophageal reflux disease without esophagitis: Secondary | ICD-10-CM

## 2018-10-02 DIAGNOSIS — I10 Essential (primary) hypertension: Secondary | ICD-10-CM

## 2018-10-04 ENCOUNTER — Telehealth: Payer: Self-pay | Admitting: Family Medicine

## 2018-10-04 NOTE — Telephone Encounter (Signed)
Copied from [place]ace [id number]. Topic: Quick Communication - Rx Refill/Question >> Oct 04, 2018  3:40 PM Gustavus Messing wrote: Medication: NEXIUM 40 MG capsule   Has the patient contacted their pharmacy? Yes.   (Agent: If yes, when and what did the pharmacy advise?) The patient called  and wold like to begin taking Nexium again and not the generic. She does not understand what medication was just sent in for her but she has never heard of it before.   Preferred Pharmacy (with phone number or street name): [place], [place] - [address] [phone] (Phone) [phone] (Fax)    Agent: Please be advised that RX refills may take up to 3 business days. We ask that you follow-up with your pharmacy.

## 2018-10-06 ENCOUNTER — Encounter: Payer: Self-pay | Admitting: Family Medicine

## 2018-10-07 ENCOUNTER — Other Ambulatory Visit: Payer: Self-pay | Admitting: Family Medicine

## 2018-10-07 ENCOUNTER — Encounter: Payer: Self-pay | Admitting: Family Medicine

## 2018-10-07 MED ORDER — ESOMEPRAZOLE MAGNESIUM 40 MG PO CPDR: 40.0000 mg | DELAYED_RELEASE_CAPSULE | Freq: Every morning | ORAL | 2 refills | Status: DC

## 2018-10-07 NOTE — Telephone Encounter (Signed)
Medication sent in. 

## 2018-10-12 LAB — URINE CYTOLOGY ANCILLARY ONLY
Bacterial vaginitis: NEGATIVE
Candida vaginitis: NEGATIVE

## 2018-10-25 ENCOUNTER — Encounter: Payer: Self-pay | Admitting: Family Medicine

## 2018-10-25 DIAGNOSIS — Z853 Personal history of malignant neoplasm of breast: Secondary | ICD-10-CM | POA: Diagnosis not present

## 2018-10-25 DIAGNOSIS — Z1231 Encounter for screening mammogram for malignant neoplasm of breast: Secondary | ICD-10-CM | POA: Diagnosis not present

## 2018-10-25 LAB — HM MAMMOGRAPHY

## 2018-10-26 DIAGNOSIS — C50911 Malignant neoplasm of unspecified site of right female breast: Secondary | ICD-10-CM | POA: Diagnosis not present

## 2018-10-31 ENCOUNTER — Other Ambulatory Visit: Payer: Self-pay | Admitting: Family Medicine

## 2018-10-31 DIAGNOSIS — I10 Essential (primary) hypertension: Secondary | ICD-10-CM

## 2018-10-31 DIAGNOSIS — E785 Hyperlipidemia, unspecified: Secondary | ICD-10-CM

## 2018-10-31 DIAGNOSIS — K219 Gastro-esophageal reflux disease without esophagitis: Secondary | ICD-10-CM

## 2018-10-31 DIAGNOSIS — R109 Unspecified abdominal pain: Secondary | ICD-10-CM

## 2018-10-31 DIAGNOSIS — Z780 Asymptomatic menopausal state: Secondary | ICD-10-CM

## 2018-11-01 DIAGNOSIS — H1010 Acute atopic conjunctivitis, unspecified eye: Secondary | ICD-10-CM | POA: Diagnosis not present

## 2018-11-01 DIAGNOSIS — H04123 Dry eye syndrome of bilateral lacrimal glands: Secondary | ICD-10-CM | POA: Diagnosis not present

## 2018-11-01 DIAGNOSIS — H40023 Open angle with borderline findings, high risk, bilateral: Secondary | ICD-10-CM | POA: Diagnosis not present

## 2018-11-01 DIAGNOSIS — H269 Unspecified cataract: Secondary | ICD-10-CM | POA: Diagnosis not present

## 2018-11-01 DIAGNOSIS — H2513 Age-related nuclear cataract, bilateral: Secondary | ICD-10-CM | POA: Diagnosis not present

## 2018-11-03 ENCOUNTER — Other Ambulatory Visit: Payer: Self-pay | Admitting: Family Medicine

## 2018-11-03 DIAGNOSIS — C50911 Malignant neoplasm of unspecified site of right female breast: Secondary | ICD-10-CM | POA: Diagnosis not present

## 2018-11-08 ENCOUNTER — Telehealth: Payer: Self-pay

## 2018-11-08 NOTE — Telephone Encounter (Signed)
Called Victoria Walker and she states she knew the screening mammogram was abnormal as [facility] had contacted her. Patient states she is schedule for diagnositic mammogram this Thursday July 23rd.   Patient made aware we will look for the report. Kathrene Alu RN

## 2018-11-09 DIAGNOSIS — E1140 Type 2 diabetes mellitus with diabetic neuropathy, unspecified: Secondary | ICD-10-CM | POA: Diagnosis not present

## 2018-11-11 ENCOUNTER — Encounter: Payer: Self-pay | Admitting: Family Medicine

## 2018-11-11 DIAGNOSIS — N6489 Other specified disorders of breast: Secondary | ICD-10-CM | POA: Diagnosis not present

## 2018-11-11 DIAGNOSIS — R922 Inconclusive mammogram: Secondary | ICD-10-CM | POA: Diagnosis not present

## 2018-11-11 LAB — HM MAMMOGRAPHY

## 2018-11-18 ENCOUNTER — Other Ambulatory Visit: Payer: Self-pay

## 2018-11-18 ENCOUNTER — Ambulatory Visit (INDEPENDENT_AMBULATORY_CARE_PROVIDER_SITE_OTHER): Payer: Medicare Other | Admitting: Family Medicine

## 2018-11-18 ENCOUNTER — Encounter: Payer: Self-pay | Admitting: Family Medicine

## 2018-11-18 VITALS — BP 120/72 | HR 62 | Temp 98.2°F | Resp 18 | Wt 170.6 lb

## 2018-11-18 DIAGNOSIS — Z0000 Encounter for general adult medical examination without abnormal findings: Secondary | ICD-10-CM | POA: Diagnosis not present

## 2018-11-18 DIAGNOSIS — E782 Mixed hyperlipidemia: Secondary | ICD-10-CM | POA: Diagnosis not present

## 2018-11-18 DIAGNOSIS — E119 Type 2 diabetes mellitus without complications: Secondary | ICD-10-CM | POA: Diagnosis not present

## 2018-11-18 DIAGNOSIS — E039 Hypothyroidism, unspecified: Secondary | ICD-10-CM | POA: Diagnosis not present

## 2018-11-18 DIAGNOSIS — I10 Essential (primary) hypertension: Secondary | ICD-10-CM | POA: Diagnosis not present

## 2018-11-18 LAB — COMPREHENSIVE METABOLIC PANEL
ALT: 19 U/L (ref 0–35)
AST: 17 U/L (ref 0–37)
Albumin: 4.4 g/dL (ref 3.5–5.2)
Alkaline Phosphatase: 55 U/L (ref 39–117)
BUN: 16 mg/dL (ref 6–23)
CO2: 29 mEq/L (ref 19–32)
Calcium: 9.5 mg/dL (ref 8.4–10.5)
Chloride: 103 mEq/L (ref 96–112)
Creatinine, Ser: 0.86 mg/dL (ref 0.40–1.20)
GFR: 79.17 mL/min (ref >60.00)
Glucose, Bld: 97 mg/dL (ref 70–99)
Potassium: 5 mEq/L (ref 3.5–5.1)
Sodium: 140 mEq/L (ref 135–145)
Total Bilirubin: 0.4 mg/dL (ref 0.2–1.2)
Total Protein: 6.5 g/dL (ref 6.0–8.3)

## 2018-11-18 LAB — CBC
HCT: 38.4 % (ref 36.0–46.0)
Hemoglobin: 12.5 g/dL (ref 12.0–15.0)
MCHC: 32.6 g/dL (ref 30.0–36.0)
MCV: 94.2 fl (ref 78.0–100.0)
Platelets: 266 10*3/uL (ref 150.0–400.0)
RBC: 4.07 Mil/uL (ref 3.87–5.11)
RDW: 13.6 % (ref 11.5–15.5)
WBC: 5.5 10*3/uL (ref 4.0–10.5)

## 2018-11-18 LAB — LIPID PANEL
Cholesterol: 151 mg/dL (ref 0–200)
HDL: 43.1 mg/dL (ref >39.00)
LDL Cholesterol: 81 mg/dL (ref 0–99)
NonHDL: 107.42
Total CHOL/HDL Ratio: 3
Triglycerides: 131 mg/dL (ref 0.0–149.0)
VLDL: 26.2 mg/dL (ref 0.0–40.0)

## 2018-11-18 LAB — TSH: TSH: 0.47 u[IU]/mL (ref 0.35–4.50)

## 2018-11-18 LAB — HEMOGLOBIN A1C: Hgb A1c MFr Bld: 6.6 % — ABNORMAL HIGH (ref 4.6–6.5)

## 2018-11-18 NOTE — Patient Instructions (Addendum)
PulseOximeter at [facility], [facility]   Preventive Care 69 Years and Older, Female Preventive care refers to lifestyle choices and visits with your health care provider that can promote health and wellness. This includes:  A yearly physical exam. This is also called an annual well check.  Regular dental and eye exams.  Immunizations.  Screening for certain conditions.  Healthy lifestyle choices, such as diet and exercise. What can I expect for my preventive care visit? Physical exam Your health care provider will check:  Height and weight. These may be used to calculate body mass index (BMI), which is a measurement that tells if you are at a healthy weight.  Heart rate and blood pressure.  Your skin for abnormal spots. Counseling Your health care provider may ask you questions about:  Alcohol, tobacco, and drug use.  Emotional well-being.  Home and relationship well-being.  Sexual activity.  Eating habits.  History of falls.  Memory and ability to understand (cognition).  Work and work Statistician.  Pregnancy and menstrual history. What immunizations do I need?  Influenza (flu) vaccine  This is recommended every year. Tetanus, diphtheria, and pertussis (Tdap) vaccine  You may need a Td booster every 10 years. Varicella (chickenpox) vaccine  You may need this vaccine if you have not already been vaccinated. Zoster (shingles) vaccine  You may need this after age 69. Pneumococcal conjugate (PCV13) vaccine  One dose is recommended after age 5. Pneumococcal polysaccharide (PPSV23) vaccine  One dose is recommended after age 69. Measles, mumps, and rubella (MMR) vaccine  You may need at least one dose of MMR if you were born in 1957 or later. You may also need a second dose. Meningococcal conjugate (MenACWY) vaccine  You may need this if you have certain conditions. Hepatitis A vaccine  You may need this if you have certain conditions or if you travel or  work in places where you may be exposed to hepatitis A. Hepatitis B vaccine  You may need this if you have certain conditions or if you travel or work in places where you may be exposed to hepatitis B. Haemophilus influenzae type b (Hib) vaccine  You may need this if you have certain conditions. You may receive vaccines as individual doses or as more than one vaccine together in one shot (combination vaccines). Talk with your health care provider about the risks and benefits of combination vaccines. What tests do I need? Blood tests  Lipid and cholesterol levels. These may be checked every 5 years, or more frequently depending on your overall health.  Hepatitis C test.  Hepatitis B test. Screening  Lung cancer screening. You may have this screening every year starting at age 69 if you have a 30-pack-year history of smoking and currently smoke or have quit within the past 15 years.  Colorectal cancer screening. All adults should have this screening starting at age 69 and continuing until age 73. Your health care provider may recommend screening at age 69 if you are at increased risk. You will have tests every 1-10 years, depending on your results and the type of screening test.  Diabetes screening. This is done by checking your blood sugar (glucose) after you have not eaten for a while (fasting). You may have this done every 1-3 years.  Mammogram. This may be done every 1-2 years. Talk with your health care provider about how often you should have regular mammograms.  BRCA-related cancer screening. This may be done if you have a family history  of breast, ovarian, tubal, or peritoneal cancers. Other tests  Sexually transmitted disease (STD) testing.  Bone density scan. This is done to screen for osteoporosis. You may have this done starting at age 69. Follow these instructions at home: Eating and drinking  Eat a diet that includes fresh fruits and vegetables, whole grains, lean  protein, and low-fat dairy products. Limit your intake of foods with high amounts of sugar, saturated fats, and salt.  Take vitamin and mineral supplements as recommended by your health care provider.  Do not drink alcohol if your health care provider tells you not to drink.  If you drink alcohol: ? Limit how much you have to 0-1 drink a day. ? Be aware of how much alcohol is in your drink. In the [place] one drink equals one 12 oz bottle of beer (355 mL), one 5 oz glass of wine (148 mL), or one 1 oz glass of hard liquor (44 mL). Lifestyle  Take daily care of your teeth and gums.  Stay active. Exercise for at least 30 minutes on 5 or more days each week.  Do not use any products that contain nicotine or tobacco, such as cigarettes, e-cigarettes, and chewing tobacco. If you need help quitting, ask your health care provider.  If you are sexually active, practice safe sex. Use a condom or other form of protection in order to prevent STIs (sexually transmitted infections).  Talk with your health care provider about taking a low-dose aspirin or statin. What's next?  Go to your health care provider once a year for a well check visit.  Ask your health care provider how often you should have your eyes and teeth checked.  Stay up to date on all vaccines. This information is not intended to replace advice given to you by your health care provider. Make sure you discuss any questions you have with your health care provider. Document Released: 05/04/2015 Document Revised: 04/01/2018 Document Reviewed: 04/01/2018 Elsevier Patient Education  2020 [employer].

## 2018-11-21 ENCOUNTER — Other Ambulatory Visit: Payer: Self-pay | Admitting: Family Medicine

## 2018-11-21 NOTE — Assessment & Plan Note (Signed)
Well controlled, no changes to meds. Encouraged heart healthy diet such as the DASH diet and exercise as tolerated. Home log reviewed encouraged to check vitals weekly

## 2018-11-21 NOTE — Assessment & Plan Note (Signed)
Patient encouraged to maintain heart healthy diet, regular exercise, adequate sleep. Consider daily probiotics. Take medications as prescribed. Labs ordered and referred.

## 2018-11-21 NOTE — Assessment & Plan Note (Signed)
On Levothyroxine, continue to monitor 

## 2018-11-21 NOTE — Assessment & Plan Note (Addendum)
Encouraged heart healthy diet, increase exercise, avoid trans fats, consider a krill oil cap daily, tolerating Atorvastatin 

## 2018-11-21 NOTE — Assessment & Plan Note (Signed)
hgba1c acceptable, minimize simple carbs. Increase exercise as tolerated. Continue current meds 

## 2018-11-21 NOTE — Progress Notes (Signed)
Subjective:    Patient ID: Victoria Walker, female    DOB: 1951/04/02, 68 y.o.   MRN: [id number]  No chief complaint on file.   HPI Patient is in today for annual preventative exam and follow up on chronic medical concerns including hypertension, diabetes and hyperlipidemia. No recent febrile illness or hospitalizations. No polyuria or polydipsia. She has been maintaining quarantine well and has no acute concerns regarding the blood pressure log she brings in. She has continued to have intermittent trboule with various joint pains and her right hip has been the most troublesome. No recent fall or injury. No incontinence. She is trying to stay active and maintain a heart healthy diet.    Past Medical History:  Diagnosis Date  . Arthritis    "both knees" (07/02/2015)  . Cancer of right breast ([place])   . Claustrophobia   . Dysrhythmia    HX OF FAST HEART RATE AND PALPITATIONS - METOPROLOL HAS HELPED  . GERD (gastroesophageal reflux disease)   . H/O hiatal hernia   . H/O iritis    LEFT EYE - STATES HER EYE BECOMES RED AND VERY SENSITIVE TO LIGHT WHEN FLARE UP OF IRITIS  . Heart murmur    "benign; 06/2015"  . High cholesterol   . History of blood transfusion    "when I had my partial hysterectomy"  . History of chickenpox 09/26/2014  . History of measles   . History of MRSA infection   . History of shingles   . Hypertension   . Hypothyroidism   . Medicare annual wellness visit, initial 04/07/2015  . Multinodular thyroid    PT HAVING TROUBLE SWALLOWING  . Neuropathy    FEET  . Primary localized osteoarthritis of right knee   . Sciatica of right side 04/25/2015  . Spinal headache    spinal headache with epidural  . Total knee replacement status, right   . Type II diabetes mellitus (HCC)    ORAL MEDICATION - NO INSULIN  . Wears glasses     Past Surgical History:  Procedure Laterality Date  . ABDOMINAL HYSTERECTOMY  1988  . ABDOMINOPLASTY  1986  . ANTERIOR AND POSTERIOR  REPAIR  05/11/2012   Procedure: ANTERIOR (CYSTOCELE) AND POSTERIOR REPAIR (RECTOCELE);  Surgeon: Reece Packer, MD;  Location: [facility];  Service: Urology;;  with graft  . BREAST BIOPSY Right   . CARDIAC CATHETERIZATION  1999  . COLONOSCOPY    . CYSTOSCOPY WITH URETHRAL DILATATION  05/11/2012   Procedure: CYSTOSCOPY WITH URETHRAL DILATATION;  Surgeon: Reece Packer, MD;  Location: [facility];  Service: Urology;;  . ESOPHAGOGASTRODUODENOSCOPY    . HERNIA REPAIR    . MASTECTOMY Right 2007  . PLANTAR FASCIA SURGERY Left   . THYROIDECTOMY N/A 02/23/2014   Procedure: TOTAL THYROIDECTOMY;  Surgeon: Armandina Gemma, MD;  Location: [facility];  Service: General;  Laterality: N/A;  . TOTAL KNEE ARTHROPLASTY Right 07/02/2015  . TOTAL KNEE ARTHROPLASTY Right 07/02/2015   Procedure: TOTAL KNEE ARTHROPLASTY;  Surgeon: Elsie Saas, MD;  Location: [facility];  Service: Orthopedics;  Laterality: Right;  . TOTAL THYROIDECTOMY  1976   removed three tumor (2 on right; 1 on left)  . TUBAL LIGATION    . UMBILICAL HERNIA REPAIR  1986  . VAGINAL PROLAPSE REPAIR  05/11/2012   Procedure: VAGINAL VAULT SUSPENSION;  Surgeon: Reece Packer, MD;  Location: [facility];  Service: Urology;;  . Clayborne Dana / PERINEUM BIOPSY     vulvar  Family History  Problem Relation Age of Onset  . Hypertension Mother   . Heart disease Mother        pacer, CHF  . Hyperlipidemia Mother   . Kidney disease Mother        1 kidney due to stone  . Kidney Stones Mother   . Hypertension Father   . Cancer Father        lung, psa  . Diabetes Father   . Alcohol abuse Father   . Diabetes Sister   . Cancer Sister   . Hypertension Sister   . Diabetes Brother   . Hypertension Brother   . Hyperlipidemia Brother   . Kidney disease Brother        dialysis  . Stroke Brother   . Hypertension Maternal Grandmother   . Heart disease Maternal Grandmother   . Stroke Maternal Grandmother   . Hypertension Maternal Grandfather   . Hypertension Daughter    . Arthritis Son   . Hyperlipidemia Sister   . Hypertension Sister   . Diabetes Sister   . Thyroid disease Sister   . Kidney disease Sister   . Cancer Sister        thyroid and breast  . Endometriosis Sister   . Arthritis Sister        back pain  . Hypertension Sister   . Thyroid disease Sister   . Hypertension Brother   . Hyperlipidemia Brother   . Hypertension Brother   . Hyperlipidemia Brother   . Benign prostatic hyperplasia Brother   . Hypertension Daughter   . Hyperlipidemia Daughter   . Diabetes Daughter   . Hypertension Maternal Aunt   . Hypertension Maternal Uncle   . Hypertension Paternal Aunt   . Cancer Paternal Aunt   . Hypertension Paternal Uncle     Social History   Socioeconomic History  . Marital status: Married    Spouse name: Not on file  . Number of children: 3  . Years of education: Not on file  . Highest education level: Not on file  Occupational History    Comment: Retired  Scientific laboratory technician  . Financial resource strain: Not on file  . Food insecurity    Worry: Not on file    Inability: Not on file  . Transportation needs    Medical: Not on file    Non-medical: Not on file  Tobacco Use  . Smoking status: Never Smoker  . Smokeless tobacco: Never Used  Substance and Sexual Activity  . Alcohol use: No    Alcohol/week: 0.0 standard drinks  . Drug use: No  . Sexual activity: Not Currently    Birth control/protection: Surgical    Comment: lives with husband, retired from various jobs, diabetic diet  Lifestyle  . Physical activity    Days per week: Not on file    Minutes per session: Not on file  . Stress: Not on file  Relationships  . Social Herbalist on phone: Not on file    Gets together: Not on file    Attends religious service: Not on file    Active member of club or organization: Not on file    Attends meetings of clubs or organizations: Not on file    Relationship status: Not on file  . Intimate partner violence    Fear  of current or ex partner: Not on file    Emotionally abused: Not on file    Physically abused: Not on file  Forced sexual activity: Not on file  Other Topics Concern  . Not on file  Social History Narrative  . Not on file    Outpatient Medications Prior to Visit  Medication Sig Dispense Refill  . aspirin 81 MG EC tablet Take 81 mg by mouth every other day. Swallow whole.    Marland Kitchen atorvastatin (LIPITOR) 40 MG tablet TAKE 1 TABLET BY MOUTH EVERY OTHER DAY, ALTERNATING WITH 1/2 TABLET 66 tablet 3  . AVAPRO 150 MG tablet TAKE 1/2 TO 1 (ONE-HALF TO ONE) TABLET BY MOUTH ONCE DAILY 90 tablet 0  . azelastine (ASTELIN) 0.1 % nasal spray Place 2 sprays into both nostrils 2 (two) times daily. Use in each nostril as directed 30 mL 12  . calcium-vitamin D (OSCAL WITH D) 250-125 MG-UNIT per tablet Take 1 tablet by mouth daily.     . Cholecalciferol (VITAMIN D) 2000 UNITS CAPS Take 1 capsule by mouth every evening.     . Ciclopirox 1 % shampoo Apply 1 each topically every Friday.     . clindamycin (CLEOCIN) 300 MG capsule Take 300 mg by mouth daily as needed (one hour before dental work).     . cycloSPORINE (RESTASIS) 0.05 % ophthalmic emulsion Place 1 drop into both eyes 2 (two) times daily.    Marland Kitchen diltiazem (TIAZAC) 360 MG 24 hr capsule TAKE 1 CAPSULE (360 MG TOTAL) BY MOUTH EVERY MORNING. 90 capsule 3  . DOCOSAHEXAENOIC ACID PO Take by mouth.    . Estradiol (YUVAFEM) 10 MCG TABS vaginal tablet INSERT 1 TAB VAGINALLY AT BEDTIME EVERY OTHER DAY 16 tablet 12  . gabapentin (NEURONTIN) 300 MG capsule TAKE 1 CAPSULE (300 MG TOTAL) BY MOUTH AT BEDTIME. 90 capsule 2  . glucose blood (ONE TOUCH TEST STRIPS) test strip Use as directed twice daily to check blood sugar. DX code E11.9 300 each 1  . levocetirizine (XYZAL) 5 MG tablet One tablet once a day as needed for runny nose. 90 tablet 3  . levothyroxine (SYNTHROID, LEVOTHROID) 88 MCG tablet 1 tab po daily except skip Sunday 90 tablet 3  . loteprednol (LOTEMAX)  0.5 % ophthalmic suspension Place 1 drop into the left eye daily as needed ("arthiritis of the eye").     . metFORMIN (GLUCOPHAGE-XR) 750 MG 24 hr tablet TAKE 1 TABLET IN AM AND 1 TABLET AT LUNCH 180 tablet 1  . metoprolol succinate (TOPROL-XL) 25 MG 24 hr tablet TAKE 1 TABLET BY MOUTH TWICE A DAY AT 12 NOON AND 4PM 180 tablet 1  . mometasone (NASONEX) 50 MCG/ACT nasal spray Two sprays each nostril once a day if needed for nasal congestion or drainage. 51 g 3  . NEXIUM 40 MG capsule Take 1 capsule by mouth in the morning 90 capsule 0  . polyvinyl alcohol-povidone (REFRESH) 1.4-0.6 % ophthalmic solution Place 1 drop into both eyes 2 (two) times daily.    . Probiotic Product (PROBIOTIC DAILY PO) Take 1 capsule by mouth daily.    Marland Kitchen spironolactone (ALDACTONE) 25 MG tablet TAEK 1 & 1/2 TAB BY MOUTH EVERY DAY 135 tablet 1  . tretinoin (RETIN-A) 0.025 % cream APPLY 1 APPLICATION TOPICALLY EVERY DAY  11   No facility-administered medications prior to visit.     Allergies  Allergen Reactions  . Codeine Anxiety    Jittery   . Penicillins Itching  . Sulfa Antibiotics Itching  . Vicodin [Hydrocodone-Acetaminophen] Itching    ITCHING     Review of Systems  Constitutional: Negative for chills, fever  and malaise/fatigue.  HENT: Negative for congestion and hearing loss.   Eyes: Negative for discharge.  Respiratory: Negative for cough, sputum production and shortness of breath.   Cardiovascular: Negative for chest pain, palpitations and leg swelling.  Gastrointestinal: Negative for abdominal pain, blood in stool, constipation, diarrhea, heartburn, nausea and vomiting.  Genitourinary: Negative for dysuria, frequency, hematuria and urgency.  Musculoskeletal: Positive for joint pain. Negative for back pain, falls and myalgias.  Skin: Negative for rash.  Neurological: Negative for dizziness, sensory change, loss of consciousness, weakness and headaches.  Endo/Heme/Allergies: Negative for environmental  allergies. Does not bruise/bleed easily.  Psychiatric/Behavioral: Negative for depression and suicidal ideas. The patient is not nervous/anxious and does not have insomnia.        Objective:    Physical Exam Constitutional:      General: She is not in acute distress.    Appearance: She is well-developed.  HENT:     Head: Normocephalic and atraumatic.  Eyes:     Conjunctiva/sclera: Conjunctivae normal.  Neck:     Musculoskeletal: Neck supple.     Thyroid: No thyromegaly.  Cardiovascular:     Rate and Rhythm: Normal rate and regular rhythm.     Heart sounds: Normal heart sounds. No murmur.  Pulmonary:     Effort: Pulmonary effort is normal. No respiratory distress.     Breath sounds: Normal breath sounds.  Abdominal:     General: Bowel sounds are normal. There is no distension.     Palpations: Abdomen is soft. There is no mass.     Tenderness: There is no abdominal tenderness.  Lymphadenopathy:     Cervical: No cervical adenopathy.  Skin:    General: Skin is warm and dry.  Neurological:     Mental Status: She is alert and oriented to person, place, and time.  Psychiatric:        Behavior: Behavior normal.     BP 120/72 (BP Location: Left Arm, Patient Position: Sitting, Cuff Size: Normal)   Pulse 62   Temp 98.2 F (36.8 C) (Oral)   Resp 18   Wt 170 lb 9.6 oz (77.4 kg)   SpO2 98%   BMI 28.39 kg/m  Wt Readings from Last 3 Encounters:  11/18/18 170 lb 9.6 oz (77.4 kg)  08/05/18 174 lb (78.9 kg)  06/28/18 176 lb 11.2 oz (80.2 kg)    Diabetic Foot Exam - Simple   No data filed     Lab Results  Component Value Date   WBC 5.5 11/18/2018   HGB 12.5 11/18/2018   HCT 38.4 11/18/2018   PLT 266.0 11/18/2018   GLUCOSE 97 11/18/2018   CHOL 151 11/18/2018   TRIG 131.0 11/18/2018   HDL 43.10 11/18/2018   LDLDIRECT 107.0 07/01/2016   LDLCALC 81 11/18/2018   ALT 19 11/18/2018   AST 17 11/18/2018   NA 140 11/18/2018   K 5.0 11/18/2018   CL 103 11/18/2018    CREATININE 0.86 11/18/2018   BUN 16 11/18/2018   CO2 29 11/18/2018   TSH 0.47 11/18/2018   INR 1.01 06/21/2015   HGBA1C 6.6 (H) 11/18/2018   MICROALBUR <0.7 08/02/2018    Lab Results  Component Value Date   TSH 0.47 11/18/2018   Lab Results  Component Value Date   WBC 5.5 11/18/2018   HGB 12.5 11/18/2018   HCT 38.4 11/18/2018   MCV 94.2 11/18/2018   PLT 266.0 11/18/2018   Lab Results  Component Value Date   NA 140 11/18/2018  K 5.0 11/18/2018   CO2 29 11/18/2018   GLUCOSE 97 11/18/2018   BUN 16 11/18/2018   CREATININE 0.86 11/18/2018   BILITOT 0.4 11/18/2018   ALKPHOS 55 11/18/2018   AST 17 11/18/2018   ALT 19 11/18/2018   PROT 6.5 11/18/2018   ALBUMIN 4.4 11/18/2018   CALCIUM 9.5 11/18/2018   ANIONGAP 9 07/04/2015   GFR 79.17 11/18/2018   Lab Results  Component Value Date   CHOL 151 11/18/2018   Lab Results  Component Value Date   HDL 43.10 11/18/2018   Lab Results  Component Value Date   LDLCALC 81 11/18/2018   Lab Results  Component Value Date   TRIG 131.0 11/18/2018   Lab Results  Component Value Date   CHOLHDL 3 11/18/2018   Lab Results  Component Value Date   HGBA1C 6.6 (H) 11/18/2018       Assessment & Plan:   Problem List Items Addressed This Visit    Essential hypertension - Primary    Well controlled, no changes to meds. Encouraged heart healthy diet such as the DASH diet and exercise as tolerated. Home log reviewed encouraged to check vitals weekly      Relevant Orders   CBC (Completed)   Comprehensive metabolic panel (Completed)   Hyperlipidemia, mixed    Encouraged heart healthy diet, increase exercise, avoid trans fats, consider a krill oil cap daily, tolerating Atorvastatin.       Relevant Orders   Lipid panel (Completed)   Hypothyroid    On Levothyroxine, continue to monitor      Relevant Orders   TSH (Completed)   Preventative health care    Patient encouraged to maintain heart healthy diet, regular exercise,  adequate sleep. Consider daily probiotics. Take medications as prescribed. Labs ordered and referred.       Diabetes mellitus without complication (HCC)    [id number] acceptable, minimize simple carbs. Increase exercise as tolerated. Continue current meds      Relevant Orders   Hemoglobin A1c (Completed)      I am having Victoria Walker maintain her calcium-vitamin D, loteprednol, Ciclopirox, cycloSPORINE, polyvinyl alcohol-povidone, Vitamin D, Probiotic Product (PROBIOTIC DAILY PO), aspirin, DOCOSAHEXAENOIC ACID PO, tretinoin, glucose blood, metFORMIN, Estradiol, clindamycin, mometasone, levocetirizine, diltiazem, azelastine, levothyroxine, spironolactone, atorvastatin, Avapro, NexIUM, gabapentin, and metoprolol succinate.  No orders of the defined types were placed in this encounter.    Penni Homans, MD

## 2018-11-23 DIAGNOSIS — R200 Anesthesia of skin: Secondary | ICD-10-CM | POA: Insufficient documentation

## 2018-11-23 DIAGNOSIS — M2041 Other hammer toe(s) (acquired), right foot: Secondary | ICD-10-CM | POA: Insufficient documentation

## 2018-11-23 DIAGNOSIS — R202 Paresthesia of skin: Secondary | ICD-10-CM | POA: Diagnosis not present

## 2018-11-23 DIAGNOSIS — M2042 Other hammer toe(s) (acquired), left foot: Secondary | ICD-10-CM | POA: Diagnosis not present

## 2018-11-25 LAB — HM DIABETES EYE EXAM

## 2018-12-07 DIAGNOSIS — Z9011 Acquired absence of right breast and nipple: Secondary | ICD-10-CM | POA: Diagnosis not present

## 2018-12-15 ENCOUNTER — Other Ambulatory Visit: Payer: Self-pay

## 2018-12-17 ENCOUNTER — Ambulatory Visit (INDEPENDENT_AMBULATORY_CARE_PROVIDER_SITE_OTHER): Payer: Medicare Other

## 2018-12-17 ENCOUNTER — Other Ambulatory Visit: Payer: Self-pay

## 2018-12-17 ENCOUNTER — Telehealth: Payer: Self-pay | Admitting: Family Medicine

## 2018-12-17 DIAGNOSIS — Z23 Encounter for immunization: Secondary | ICD-10-CM | POA: Diagnosis not present

## 2018-12-17 NOTE — Telephone Encounter (Signed)
Patient came into the office to drop off a form to be filled out by Dr. Charlett Blake for diabetic shoes. Placed in provider tray. Please call when ready for pick up.

## 2018-12-21 NOTE — Telephone Encounter (Signed)
Called patient to let her know that we need her visit from her [place] office before Dr. Charlett Blake can sign off

## 2019-01-03 ENCOUNTER — Telehealth: Payer: Self-pay | Admitting: Family Medicine

## 2019-01-03 ENCOUNTER — Other Ambulatory Visit: Payer: Self-pay | Admitting: Family Medicine

## 2019-01-03 DIAGNOSIS — R252 Cramp and spasm: Secondary | ICD-10-CM

## 2019-01-03 DIAGNOSIS — I10 Essential (primary) hypertension: Secondary | ICD-10-CM

## 2019-01-03 DIAGNOSIS — E782 Mixed hyperlipidemia: Secondary | ICD-10-CM

## 2019-01-03 NOTE — Telephone Encounter (Signed)
Spironolactone can mess with electrolytes and cause cramping. Please have her come in for cmp, magnesium, ck and cbc for muscle cramping, hyperlipidemia, htn and then have a virtual visit with me after that to discuss options. Make sure she is able to track her bp and pulse for [place] daily til visit so we can discuss options. Also get some over the counter H[place]'s leg cramp medicine to see if that helps her leg cramps

## 2019-01-03 NOTE — Telephone Encounter (Signed)
Pt called in because she said that her legs and thighs are cramping. Pt says that she is taking a new BP medication, pt isn't sure if that is the cause but do want to make provider aware. Pt would like to be advised further.    CB: [phone]

## 2019-01-03 NOTE — Telephone Encounter (Signed)
Please advise 

## 2019-01-04 ENCOUNTER — Encounter: Payer: Self-pay | Admitting: Family Medicine

## 2019-01-04 MED ORDER — DILTIAZEM HCL ER BEADS 360 MG PO CP24: ORAL_CAPSULE | ORAL | 3 refills | Status: DC

## 2019-01-04 NOTE — Telephone Encounter (Signed)
Spoke with patient she stated that she got a different type of diltiazem.  Spoke with pharmacy and they stated that we now have to write DAW 1 for her to get that specific generic.  They will see if they can get the brand that she has been getting and order if need be.  Patient notified and advised her to check her bag before she leaves pharmacy to make sure she has what she needs.  rx sent for DAW 1 for them to give her that specific generic.

## 2019-01-04 NOTE — Telephone Encounter (Signed)
Patient will get labs drawn at [place] and virtual appt made for 9/22. Also patient notified of notes.

## 2019-01-05 ENCOUNTER — Other Ambulatory Visit (INDEPENDENT_AMBULATORY_CARE_PROVIDER_SITE_OTHER): Payer: Medicare Other

## 2019-01-05 DIAGNOSIS — E782 Mixed hyperlipidemia: Secondary | ICD-10-CM

## 2019-01-05 DIAGNOSIS — R252 Cramp and spasm: Secondary | ICD-10-CM

## 2019-01-05 DIAGNOSIS — I10 Essential (primary) hypertension: Secondary | ICD-10-CM | POA: Diagnosis not present

## 2019-01-05 LAB — CBC
HCT: 39.2 % (ref 36.0–46.0)
Hemoglobin: 12.7 g/dL (ref 12.0–15.0)
MCHC: 32.4 g/dL (ref 30.0–36.0)
MCV: 94.6 fl (ref 78.0–100.0)
Platelets: 276 10*3/uL (ref 150.0–400.0)
RBC: 4.15 Mil/uL (ref 3.87–5.11)
RDW: 13.3 % (ref 11.5–15.5)
WBC: 6.2 10*3/uL (ref 4.0–10.5)

## 2019-01-05 LAB — COMPREHENSIVE METABOLIC PANEL
ALT: 17 U/L (ref 0–35)
AST: 15 U/L (ref 0–37)
Albumin: 4.4 g/dL (ref 3.5–5.2)
Alkaline Phosphatase: 57 U/L (ref 39–117)
BUN: 13 mg/dL (ref 6–23)
CO2: 29 mEq/L (ref 19–32)
Calcium: 9.4 mg/dL (ref 8.4–10.5)
Chloride: 106 mEq/L (ref 96–112)
Creatinine, Ser: 0.92 mg/dL (ref 0.40–1.20)
GFR: 73.21 mL/min (ref >60.00)
Glucose, Bld: 97 mg/dL (ref 70–99)
Potassium: 4.6 mEq/L (ref 3.5–5.1)
Sodium: 142 mEq/L (ref 135–145)
Total Bilirubin: 0.5 mg/dL (ref 0.2–1.2)
Total Protein: 6.9 g/dL (ref 6.0–8.3)

## 2019-01-05 LAB — MAGNESIUM: Magnesium: 2 mg/dL (ref 1.5–2.5)

## 2019-01-05 LAB — CK: Total CK: 98 U/L (ref 7–177)

## 2019-01-11 ENCOUNTER — Ambulatory Visit (INDEPENDENT_AMBULATORY_CARE_PROVIDER_SITE_OTHER): Payer: Medicare Other | Admitting: Family Medicine

## 2019-01-11 ENCOUNTER — Other Ambulatory Visit: Payer: Self-pay

## 2019-01-11 DIAGNOSIS — M791 Myalgia, unspecified site: Secondary | ICD-10-CM

## 2019-01-11 DIAGNOSIS — E119 Type 2 diabetes mellitus without complications: Secondary | ICD-10-CM

## 2019-01-11 DIAGNOSIS — I10 Essential (primary) hypertension: Secondary | ICD-10-CM | POA: Diagnosis not present

## 2019-01-11 DIAGNOSIS — E039 Hypothyroidism, unspecified: Secondary | ICD-10-CM | POA: Diagnosis not present

## 2019-01-12 ENCOUNTER — Encounter: Payer: Self-pay | Admitting: Family Medicine

## 2019-01-12 DIAGNOSIS — M791 Myalgia, unspecified site: Secondary | ICD-10-CM | POA: Insufficient documentation

## 2019-01-12 NOTE — Assessment & Plan Note (Signed)
Supplement and monitor 

## 2019-01-12 NOTE — Assessment & Plan Note (Signed)
Monitor blood pressure weekly, no changes to meds. Encouraged heart healthy diet such as the DASH diet and exercise as tolerated.

## 2019-01-12 NOTE — Assessment & Plan Note (Signed)
hgba1c acceptable, minimize simple carbs. Increase exercise as tolerated. Continue current meds 

## 2019-01-12 NOTE — Assessment & Plan Note (Signed)
Much better since she changed back to original form of Diltiazem. She was changed to a different generic by the pharmacy and she notes that is when her muscle cramps began then when she switched back the cramps resolved. She agrees to go to pharmacy and confirm the manufacturers on both pills so we can request the one she tolerates.

## 2019-01-12 NOTE — Progress Notes (Signed)
Virtual Visit via phone Note  I connected with Victoria Walker on 01/11/19 at  1:20 PM EDT by a phone enabled telemedicine application and verified that I am speaking with the correct person using two identifiers.  Location: Patient: home Provider: office   I discussed the limitations of evaluation and management by telemedicine and the availability of in person appointments. The patient expressed understanding and agreed to proceed. Princess Eulas Post CMA was able to get the patient set up on a phone visit after being unable to set up a video visit   Subjective:    Patient ID: Victoria Walker, female    DOB: December 20, 1950, 68 y.o.   MRN: [id number]  No chief complaint on file.   HPI Patient is in today for follow upon chronic medical concerns including hypertension, hyperlipidemia and myalgias. She feels well today. No recent febrile illness or hospitalizations. Much better since she changed back to original form of Diltiazem. She was changed to a different generic by the pharmacy and she notes that is when her muscle cramps began then when she switched back the cramps resolved.Denies CP/palp/SOB/HA/congestion/fevers/GI or GU c/o. Taking meds as prescribed  Past Medical History:  Diagnosis Date  . Arthritis    "both knees" (07/02/2015)  . Cancer of right breast ([place])   . Claustrophobia   . Dysrhythmia    HX OF FAST HEART RATE AND PALPITATIONS - METOPROLOL HAS HELPED  . GERD (gastroesophageal reflux disease)   . H/O hiatal hernia   . H/O iritis    LEFT EYE - STATES HER EYE BECOMES RED AND VERY SENSITIVE TO LIGHT WHEN FLARE UP OF IRITIS  . Heart murmur    "benign; 06/2015"  . High cholesterol   . History of blood transfusion    "when I had my partial hysterectomy"  . History of chickenpox 09/26/2014  . History of measles   . History of MRSA infection   . History of shingles   . Hypertension   . Hypothyroidism   . Medicare annual wellness visit, initial 04/07/2015  .  Multinodular thyroid    PT HAVING TROUBLE SWALLOWING  . Neuropathy    FEET  . Primary localized osteoarthritis of right knee   . Sciatica of right side 04/25/2015  . Spinal headache    spinal headache with epidural  . Total knee replacement status, right   . Type II diabetes mellitus (HCC)    ORAL MEDICATION - NO INSULIN  . Wears glasses     Past Surgical History:  Procedure Laterality Date  . ABDOMINAL HYSTERECTOMY  1988  . ABDOMINOPLASTY  1986  . ANTERIOR AND POSTERIOR REPAIR  05/11/2012   Procedure: ANTERIOR (CYSTOCELE) AND POSTERIOR REPAIR (RECTOCELE);  Surgeon: Reece Packer, MD;  Location: [facility];  Service: Urology;;  with graft  . BREAST BIOPSY Right   . CARDIAC CATHETERIZATION  1999  . COLONOSCOPY    . CYSTOSCOPY WITH URETHRAL DILATATION  05/11/2012   Procedure: CYSTOSCOPY WITH URETHRAL DILATATION;  Surgeon: Reece Packer, MD;  Location: [facility];  Service: Urology;;  . ESOPHAGOGASTRODUODENOSCOPY    . HERNIA REPAIR    . MASTECTOMY Right 2007  . PLANTAR FASCIA SURGERY Left   . THYROIDECTOMY N/A 02/23/2014   Procedure: TOTAL THYROIDECTOMY;  Surgeon: Armandina Gemma, MD;  Location: [facility];  Service: General;  Laterality: N/A;  . TOTAL KNEE ARTHROPLASTY Right 07/02/2015  . TOTAL KNEE ARTHROPLASTY Right 07/02/2015   Procedure: TOTAL KNEE ARTHROPLASTY;  Surgeon: Elsie Saas, MD;  Location:  [facility];  Service: Orthopedics;  Laterality: Right;  . TOTAL THYROIDECTOMY  1976   removed three tumor (2 on right; 1 on left)  . TUBAL LIGATION    . UMBILICAL HERNIA REPAIR  1986  . VAGINAL PROLAPSE REPAIR  05/11/2012   Procedure: VAGINAL VAULT SUSPENSION;  Surgeon: Reece Packer, MD;  Location: [facility];  Service: Urology;;  . VULVA / PERINEUM BIOPSY     vulvar    Family History  Problem Relation Age of Onset  . Hypertension Mother   . Heart disease Mother        pacer, CHF  . Hyperlipidemia Mother   . Kidney disease Mother        1 kidney due to stone  . Kidney Stones Mother    . Hypertension Father   . Cancer Father        lung, psa  . Diabetes Father   . Alcohol abuse Father   . Diabetes Sister   . Cancer Sister   . Hypertension Sister   . Diabetes Brother   . Hypertension Brother   . Hyperlipidemia Brother   . Kidney disease Brother        dialysis  . Stroke Brother   . Hypertension Maternal Grandmother   . Heart disease Maternal Grandmother   . Stroke Maternal Grandmother   . Hypertension Maternal Grandfather   . Hypertension Daughter   . Arthritis Son   . Hyperlipidemia Sister   . Hypertension Sister   . Diabetes Sister   . Thyroid disease Sister   . Kidney disease Sister   . Cancer Sister        thyroid and breast  . Endometriosis Sister   . Arthritis Sister        back pain  . Hypertension Sister   . Thyroid disease Sister   . Hypertension Brother   . Hyperlipidemia Brother   . Hypertension Brother   . Hyperlipidemia Brother   . Benign prostatic hyperplasia Brother   . Hypertension Daughter   . Hyperlipidemia Daughter   . Diabetes Daughter   . Hypertension Maternal Aunt   . Hypertension Maternal Uncle   . Hypertension Paternal Aunt   . Cancer Paternal Aunt   . Hypertension Paternal Uncle     Social History   Socioeconomic History  . Marital status: Married    Spouse name: Not on file  . Number of children: 3  . Years of education: Not on file  . Highest education level: Not on file  Occupational History    Comment: Retired  Scientific laboratory technician  . Financial resource strain: Not on file  . Food insecurity    Worry: Not on file    Inability: Not on file  . Transportation needs    Medical: Not on file    Non-medical: Not on file  Tobacco Use  . Smoking status: Never Smoker  . Smokeless tobacco: Never Used  Substance and Sexual Activity  . Alcohol use: No    Alcohol/week: 0.0 standard drinks  . Drug use: No  . Sexual activity: Not Currently    Birth control/protection: Surgical    Comment: lives with husband, retired  from various jobs, diabetic diet  Lifestyle  . Physical activity    Days per week: Not on file    Minutes per session: Not on file  . Stress: Not on file  Relationships  . Social Herbalist on phone: Not on file    Gets together:  Not on file    Attends religious service: Not on file    Active member of club or organization: Not on file    Attends meetings of clubs or organizations: Not on file    Relationship status: Not on file  . Intimate partner violence    Fear of current or ex partner: Not on file    Emotionally abused: Not on file    Physically abused: Not on file    Forced sexual activity: Not on file  Other Topics Concern  . Not on file  Social History Narrative  . Not on file    Outpatient Medications Prior to Visit  Medication Sig Dispense Refill  . aspirin 81 MG EC tablet Take 81 mg by mouth every other day. Swallow whole.    Marland Kitchen atorvastatin (LIPITOR) 40 MG tablet TAKE 1 TABLET BY MOUTH EVERY OTHER DAY, ALTERNATING WITH 1/2 TABLET 66 tablet 3  . AVAPRO 150 MG tablet TAKE 1/2 TO 1 (ONE-HALF TO ONE) TABLET BY MOUTH ONCE DAILY 90 tablet 1  . azelastine (ASTELIN) 0.1 % nasal spray Place 2 sprays into both nostrils 2 (two) times daily. Use in each nostril as directed 30 mL 12  . calcium-vitamin D (OSCAL WITH D) 250-125 MG-UNIT per tablet Take 1 tablet by mouth daily.     . Cholecalciferol (VITAMIN D) 2000 UNITS CAPS Take 1 capsule by mouth every evening.     . Ciclopirox 1 % shampoo Apply 1 each topically every Friday.     . clindamycin (CLEOCIN) 300 MG capsule Take 300 mg by mouth daily as needed (one hour before dental work).     . cycloSPORINE (RESTASIS) 0.05 % ophthalmic emulsion Place 1 drop into both eyes 2 (two) times daily.    Marland Kitchen diltiazem (TIAZAC) 360 MG 24 hr capsule TAKE 1 CAPSULE (360 MG TOTAL) BY MOUTH EVERY MORNING. 90 capsule 3  . DOCOSAHEXAENOIC ACID PO Take by mouth.    . Estradiol (YUVAFEM) 10 MCG TABS vaginal tablet INSERT 1 TAB VAGINALLY AT  BEDTIME EVERY OTHER DAY 16 tablet 12  . gabapentin (NEURONTIN) 300 MG capsule TAKE 1 CAPSULE (300 MG TOTAL) BY MOUTH AT BEDTIME. 90 capsule 2  . glucose blood (ONE TOUCH TEST STRIPS) test strip Use as directed twice daily to check blood sugar. DX code E11.9 300 each 1  . levocetirizine (XYZAL) 5 MG tablet One tablet once a day as needed for runny nose. 90 tablet 3  . levothyroxine (SYNTHROID, LEVOTHROID) 88 MCG tablet 1 tab po daily except skip Sunday 90 tablet 3  . loteprednol (LOTEMAX) 0.5 % ophthalmic suspension Place 1 drop into the left eye daily as needed ("arthiritis of the eye").     . metFORMIN (GLUCOPHAGE-XR) 750 MG 24 hr tablet TAKE 1 TABLET IN AM AND 1 TABLET AT LUNCH 180 tablet 1  . metoprolol succinate (TOPROL-XL) 25 MG 24 hr tablet TAKE 1 TABLET BY MOUTH TWICE A DAY AT 12 NOON AND 4PM 180 tablet 1  . mometasone (NASONEX) 50 MCG/ACT nasal spray Two sprays each nostril once a day if needed for nasal congestion or drainage. 51 g 3  . NEXIUM 40 MG capsule Take 1 capsule by mouth in the morning 90 capsule 1  . polyvinyl alcohol-povidone (REFRESH) 1.4-0.6 % ophthalmic solution Place 1 drop into both eyes 2 (two) times daily.    . Probiotic Product (PROBIOTIC DAILY PO) Take 1 capsule by mouth daily.    Marland Kitchen spironolactone (ALDACTONE) 25 MG tablet TAEK 1 &  1/2 TAB BY MOUTH EVERY DAY 135 tablet 1  . tretinoin (RETIN-A) 0.025 % cream APPLY 1 APPLICATION TOPICALLY EVERY DAY  11   No facility-administered medications prior to visit.     Allergies  Allergen Reactions  . Codeine Anxiety    Jittery   . Penicillins Itching  . Sulfa Antibiotics Itching  . Vicodin [Hydrocodone-Acetaminophen] Itching    ITCHING     Review of Systems  Constitutional: Negative for fever and malaise/fatigue.  HENT: Negative for congestion.   Eyes: Negative for blurred vision.  Respiratory: Negative for shortness of breath.   Cardiovascular: Negative for chest pain, palpitations and leg swelling.   Gastrointestinal: Negative for abdominal pain, blood in stool and nausea.  Genitourinary: Negative for dysuria and frequency.  Musculoskeletal: Negative for falls.  Skin: Negative for rash.  Neurological: Negative for dizziness, loss of consciousness and headaches.  Endo/Heme/Allergies: Negative for environmental allergies.  Psychiatric/Behavioral: Negative for depression. The patient is not nervous/anxious.        Objective:    Physical Exam unable to obtain via phone  There were no vitals taken for this visit. Wt Readings from Last 3 Encounters:  11/18/18 170 lb 9.6 oz (77.4 kg)  08/05/18 174 lb (78.9 kg)  06/28/18 176 lb 11.2 oz (80.2 kg)    Diabetic Foot Exam - Simple   No data filed     Lab Results  Component Value Date   WBC 6.2 01/05/2019   HGB 12.7 01/05/2019   HCT 39.2 01/05/2019   PLT 276.0 01/05/2019   GLUCOSE 97 01/05/2019   CHOL 151 11/18/2018   TRIG 131.0 11/18/2018   HDL 43.10 11/18/2018   LDLDIRECT 107.0 07/01/2016   LDLCALC 81 11/18/2018   ALT 17 01/05/2019   AST 15 01/05/2019   NA 142 01/05/2019   K 4.6 01/05/2019   CL 106 01/05/2019   CREATININE 0.92 01/05/2019   BUN 13 01/05/2019   CO2 29 01/05/2019   TSH 0.47 11/18/2018   INR 1.01 06/21/2015   HGBA1C 6.6 (H) 11/18/2018   MICROALBUR <0.7 08/02/2018    Lab Results  Component Value Date   TSH 0.47 11/18/2018   Lab Results  Component Value Date   WBC 6.2 01/05/2019   HGB 12.7 01/05/2019   HCT 39.2 01/05/2019   MCV 94.6 01/05/2019   PLT 276.0 01/05/2019   Lab Results  Component Value Date   NA 142 01/05/2019   K 4.6 01/05/2019   CO2 29 01/05/2019   GLUCOSE 97 01/05/2019   BUN 13 01/05/2019   CREATININE 0.92 01/05/2019   BILITOT 0.5 01/05/2019   ALKPHOS 57 01/05/2019   AST 15 01/05/2019   ALT 17 01/05/2019   PROT 6.9 01/05/2019   ALBUMIN 4.4 01/05/2019   CALCIUM 9.4 01/05/2019   ANIONGAP 9 07/04/2015   GFR 73.21 01/05/2019   Lab Results  Component Value Date   CHOL 151  11/18/2018   Lab Results  Component Value Date   HDL 43.10 11/18/2018   Lab Results  Component Value Date   LDLCALC 81 11/18/2018   Lab Results  Component Value Date   TRIG 131.0 11/18/2018   Lab Results  Component Value Date   CHOLHDL 3 11/18/2018   Lab Results  Component Value Date   HGBA1C 6.6 (H) 11/18/2018       Assessment & Plan:   Problem List Items Addressed This Visit    Essential hypertension    Monitor blood pressure weekly, no changes to meds. Encouraged heart  healthy diet such as the DASH diet and exercise as tolerated.       Hypothyroid    Supplement and monitor      Diabetes mellitus without complication (HCC)    [id number] acceptable, minimize simple carbs. Increase exercise as tolerated. Continue current meds      Myalgia    Much better since she changed back to original form of Diltiazem. She was changed to a different generic by the pharmacy and she notes that is when her muscle cramps began then when she switched back the cramps resolved. She agrees to go to pharmacy and confirm the manufacturers on both pills so we can request the one she tolerates.          I am having Victoria Walker maintain her calcium-vitamin D, loteprednol, Ciclopirox, cycloSPORINE, polyvinyl alcohol-povidone, Vitamin D, Probiotic Product (PROBIOTIC DAILY PO), aspirin, DOCOSAHEXAENOIC ACID PO, tretinoin, glucose blood, Estradiol, clindamycin, mometasone, levocetirizine, azelastine, levothyroxine, spironolactone, atorvastatin, gabapentin, metoprolol succinate, metFORMIN, Avapro, NexIUM, and diltiazem.  No orders of the defined types were placed in this encounter.    I discussed the assessment and treatment plan with the patient. The patient was provided an opportunity to ask questions and all were answered. The patient agreed with the plan and demonstrated an understanding of the instructions.   The patient was advised to call back or seek an in-person evaluation if the  symptoms worsen or if the condition fails to improve as anticipated.  I provided 25 minutes of non-face-to-face time during this encounter.   Penni Homans, MD

## 2019-02-07 DIAGNOSIS — E1140 Type 2 diabetes mellitus with diabetic neuropathy, unspecified: Secondary | ICD-10-CM | POA: Diagnosis not present

## 2019-02-14 ENCOUNTER — Other Ambulatory Visit: Payer: Self-pay

## 2019-02-14 ENCOUNTER — Telehealth: Payer: Self-pay

## 2019-02-14 DIAGNOSIS — N952 Postmenopausal atrophic vaginitis: Secondary | ICD-10-CM

## 2019-02-14 MED ORDER — ESTRADIOL 10 MCG VA TABS: ORAL_TABLET | VAGINAL | 12 refills | Status: DC

## 2019-02-14 MED ORDER — FLUCONAZOLE 100 MG PO TABS: 100.0000 mg | ORAL_TABLET | Freq: Every day | ORAL | 2 refills | Status: DC

## 2019-02-14 NOTE — Telephone Encounter (Signed)
Patient called stating she would like her estradiol to be called into a new pharmacy due to her insurance. Patient requesting estradiol be sent to [facility] on [address]   Patient also asked for refill on diflucan as she has to take antibiotics before going to the dentist and it have created some vaginal itching. Patient given refill. Kathrene Alu RN

## 2019-02-15 DIAGNOSIS — E1140 Type 2 diabetes mellitus with diabetic neuropathy, unspecified: Secondary | ICD-10-CM | POA: Diagnosis not present

## 2019-02-24 MED FILL — RESTASIS 0.05% EYE EMULSION: 0.05 | 90 days supply | Qty: 180 | Fill #0

## 2019-02-25 MED FILL — NASONEX 50 MCG NASAL SPRAY: 50 | 90 days supply | Qty: 51 | Fill #0

## 2019-03-14 ENCOUNTER — Ambulatory Visit: Payer: Medicare Other | Admitting: *Deleted

## 2019-03-15 NOTE — Progress Notes (Signed)
Virtual Visit via Video Note  I connected with patient on 03/21/19 at  8:45 AM EST by audio enabled telemedicine application and verified that I am speaking with the correct person using two identifiers.   THIS ENCOUNTER IS A VIRTUAL VISIT DUE TO COVID-19 - PATIENT WAS NOT SEEN IN THE OFFICE. PATIENT HAS CONSENTED TO VIRTUAL VISIT / TELEMEDICINE VISIT   Location of patient: home  Location of provider: office  I discussed the limitations of evaluation and management by telemedicine and the availability of in person appointments. The patient expressed understanding and agreed to proceed.   Subjective:   Victoria Walker is a 68 y.o. female who presents for Medicare Annual (Subsequent) preventive examination.  Review of Systems:  Home Safety/Smoke Alarms: Feels safe in home. Smoke alarms in place.   Lives w/ husband in 1 story home w/ basement. Denies difficulty w/ stairs.  Female:     Mammo-  10/25/18     Dexa scan- 03/19/17. Ordered today.     CCS-next due 07/20/2021    Objective:     Vitals:  Unable to assess. This visit is enabled though telemedicine due to Covid 19.   Advanced Directives 03/21/2019 05/17/2018 03/11/2018 03/17/2017 03/10/2017 02/25/2016 12/29/2015  Does Patient Have a Medical Advance Directive? Yes Yes Yes Yes Yes Yes Yes  Type of Paramedic of [place];Living will - [place];Living will - [place];Living will [place];Living will Living will;Healthcare Power of Attorney  Does patient want to make changes to medical advance directive? No - Patient declined No - Patient declined - No - Patient declined - - -  Copy of [place] in Chart? Yes - validated most recent copy scanned in chart (See row information) - Yes - validated most recent copy scanned in chart (See row information) - No - copy requested No - copy requested No - copy requested  Would patient like  information on creating a medical advance directive? - - - - - - -  Pre-existing out of facility DNR order (yellow form or pink MOST form) - - - - - - -    Tobacco Social History   Tobacco Use  Smoking Status Never Smoker  Smokeless Tobacco Never Used     Counseling given: Not Answered   Clinical Intake: Pain : No/denies pain   Past Medical History:  Diagnosis Date  . Arthritis    "both knees" (07/02/2015)  . Cancer of right breast ([place])   . Claustrophobia   . Dysrhythmia    HX OF FAST HEART RATE AND PALPITATIONS - METOPROLOL HAS HELPED  . GERD (gastroesophageal reflux disease)   . H/O hiatal hernia   . H/O iritis    LEFT EYE - STATES HER EYE BECOMES RED AND VERY SENSITIVE TO LIGHT WHEN FLARE UP OF IRITIS  . Heart murmur    "benign; 06/2015"  . High cholesterol   . History of blood transfusion    "when I had my partial hysterectomy"  . History of chickenpox 09/26/2014  . History of measles   . History of MRSA infection   . History of shingles   . Hypertension   . Hypothyroidism   . Medicare annual wellness visit, initial 04/07/2015  . Multinodular thyroid    PT HAVING TROUBLE SWALLOWING  . Neuropathy    FEET  . Primary localized osteoarthritis of right knee   . Sciatica of right side 04/25/2015  . Spinal headache  spinal headache with epidural  . Total knee replacement status, right   . Type II diabetes mellitus (HCC)    ORAL MEDICATION - NO INSULIN  . Wears glasses    Past Surgical History:  Procedure Laterality Date  . ABDOMINAL HYSTERECTOMY  1988  . ABDOMINOPLASTY  1986  . ANTERIOR AND POSTERIOR REPAIR  05/11/2012   Procedure: ANTERIOR (CYSTOCELE) AND POSTERIOR REPAIR (RECTOCELE);  Surgeon: Reece Packer, MD;  Location: [facility];  Service: Urology;;  with graft  . BREAST BIOPSY Right   . CARDIAC CATHETERIZATION  1999  . COLONOSCOPY    . CYSTOSCOPY WITH URETHRAL DILATATION  05/11/2012   Procedure: CYSTOSCOPY WITH URETHRAL DILATATION;  Surgeon: Reece Packer, MD;  Location: [facility];  Service: Urology;;  . ESOPHAGOGASTRODUODENOSCOPY    . HERNIA REPAIR    . MASTECTOMY Right 2007  . PLANTAR FASCIA SURGERY Left   . THYROIDECTOMY N/A 02/23/2014   Procedure: TOTAL THYROIDECTOMY;  Surgeon: Armandina Gemma, MD;  Location: [facility];  Service: General;  Laterality: N/A;  . TOTAL KNEE ARTHROPLASTY Right 07/02/2015  . TOTAL KNEE ARTHROPLASTY Right 07/02/2015   Procedure: TOTAL KNEE ARTHROPLASTY;  Surgeon: Elsie Saas, MD;  Location: [facility];  Service: Orthopedics;  Laterality: Right;  . TOTAL THYROIDECTOMY  1976   removed three tumor (2 on right; 1 on left)  . TUBAL LIGATION    . UMBILICAL HERNIA REPAIR  1986  . VAGINAL PROLAPSE REPAIR  05/11/2012   Procedure: VAGINAL VAULT SUSPENSION;  Surgeon: Reece Packer, MD;  Location: [facility];  Service: Urology;;  . VULVA / PERINEUM BIOPSY     vulvar   Family History  Problem Relation Age of Onset  . Hypertension Mother   . Heart disease Mother        pacer, CHF  . Hyperlipidemia Mother   . Kidney disease Mother        1 kidney due to stone  . Kidney Stones Mother   . Hypertension Father   . Cancer Father        lung, psa  . Diabetes Father   . Alcohol abuse Father   . Diabetes Sister   . Cancer Sister   . Hypertension Sister   . Diabetes Brother   . Hypertension Brother   . Hyperlipidemia Brother   . Kidney disease Brother        dialysis  . Stroke Brother   . Hypertension Maternal Grandmother   . Heart disease Maternal Grandmother   . Stroke Maternal Grandmother   . Hypertension Maternal Grandfather   . Hypertension Daughter   . Arthritis Son   . Hyperlipidemia Sister   . Hypertension Sister   . Diabetes Sister   . Thyroid disease Sister   . Kidney disease Sister   . Cancer Sister        thyroid and breast  . Endometriosis Sister   . Arthritis Sister        back pain  . Hypertension Sister   . Thyroid disease Sister   . Hypertension Brother   . Hyperlipidemia Brother   .  Hypertension Brother   . Hyperlipidemia Brother   . Benign prostatic hyperplasia Brother   . Hypertension Daughter   . Hyperlipidemia Daughter   . Diabetes Daughter   . Hypertension Maternal Aunt   . Hypertension Maternal Uncle   . Hypertension Paternal Aunt   . Cancer Paternal Aunt   . Hypertension Paternal Uncle    Social History   Socioeconomic  History  . Marital status: Married    Spouse name: Not on file  . Number of children: 3  . Years of education: Not on file  . Highest education level: Not on file  Occupational History    Comment: Retired  Scientific laboratory technician  . Financial resource strain: Not on file  . Food insecurity    Worry: Not on file    Inability: Not on file  . Transportation needs    Medical: Not on file    Non-medical: Not on file  Tobacco Use  . Smoking status: Never Smoker  . Smokeless tobacco: Never Used  Substance and Sexual Activity  . Alcohol use: No    Alcohol/week: 0.0 standard drinks  . Drug use: No  . Sexual activity: Not Currently    Birth control/protection: Surgical    Comment: lives with husband, retired from various jobs, diabetic diet  Lifestyle  . Physical activity    Days per week: Not on file    Minutes per session: Not on file  . Stress: Not on file  Relationships  . Social Herbalist on phone: Not on file    Gets together: Not on file    Attends religious service: Not on file    Active member of club or organization: Not on file    Attends meetings of clubs or organizations: Not on file    Relationship status: Not on file  Other Topics Concern  . Not on file  Social History Narrative  . Not on file    Outpatient Encounter Medications as of 03/21/2019  Medication Sig  . aspirin 81 MG EC tablet Take 81 mg by mouth every other day. Swallow whole.  Marland Kitchen atorvastatin (LIPITOR) 40 MG tablet TAKE 1 TABLET BY MOUTH EVERY OTHER DAY, ALTERNATING WITH 1/2 TABLET  . AVAPRO 150 MG tablet TAKE 1/2 TO 1 (ONE-HALF TO ONE) TABLET  BY MOUTH ONCE DAILY  . azelastine (ASTELIN) 0.1 % nasal spray Place 2 sprays into both nostrils 2 (two) times daily. Use in each nostril as directed  . calcium-vitamin D (OSCAL WITH D) 250-125 MG-UNIT per tablet Take 1 tablet by mouth daily.   . Cholecalciferol (VITAMIN D) 2000 UNITS CAPS Take 1 capsule by mouth every evening.   . Ciclopirox 1 % shampoo Apply 1 each topically every Friday.   . cycloSPORINE (RESTASIS) 0.05 % ophthalmic emulsion Place 1 drop into both eyes 2 (two) times daily.  Marland Kitchen diltiazem (TIAZAC) 360 MG 24 hr capsule TAKE 1 CAPSULE (360 MG TOTAL) BY MOUTH EVERY MORNING.  . DOCOSAHEXAENOIC ACID PO Take by mouth.  . Estradiol (YUVAFEM) 10 MCG TABS vaginal tablet INSERT 1 TAB VAGINALLY AT BEDTIME EVERY OTHER DAY  . gabapentin (NEURONTIN) 300 MG capsule TAKE 1 CAPSULE (300 MG TOTAL) BY MOUTH AT BEDTIME.  Marland Kitchen glucose blood (ONE TOUCH TEST STRIPS) test strip Use as directed twice daily to check blood sugar. DX code E11.9  . levocetirizine (XYZAL) 5 MG tablet One tablet once a day as needed for runny nose.  . levothyroxine (SYNTHROID, LEVOTHROID) 88 MCG tablet 1 tab po daily except skip Sunday  . loteprednol (LOTEMAX) 0.5 % ophthalmic suspension Place 1 drop into the left eye daily as needed ("arthiritis of the eye").   . metFORMIN (GLUCOPHAGE-XR) 750 MG 24 hr tablet TAKE 1 TABLET IN AM AND 1 TABLET AT LUNCH  . metoprolol succinate (TOPROL-XL) 25 MG 24 hr tablet TAKE 1 TABLET BY MOUTH TWICE A DAY AT 12 NOON  AND 4PM  . mometasone (NASONEX) 50 MCG/ACT nasal spray Two sprays each nostril once a day if needed for nasal congestion or drainage.  Marland Kitchen NEXIUM 40 MG capsule Take 1 capsule by mouth in the morning  . polyvinyl alcohol-povidone (REFRESH) 1.4-0.6 % ophthalmic solution Place 1 drop into both eyes 2 (two) times daily.  . Probiotic Product (PROBIOTIC DAILY PO) Take 1 capsule by mouth daily.  Marland Kitchen spironolactone (ALDACTONE) 25 MG tablet TAEK 1 & 1/2 TAB BY MOUTH EVERY DAY  . tretinoin  (RETIN-A) 0.025 % cream APPLY 1 APPLICATION TOPICALLY EVERY DAY  . clindamycin (CLEOCIN) 300 MG capsule Take 300 mg by mouth daily as needed (one hour before dental work).   . fluconazole (DIFLUCAN) 100 MG tablet Take 1 tablet (100 mg total) by mouth daily. (Patient not taking: Reported on 03/21/2019)   No facility-administered encounter medications on file as of 03/21/2019.     Activities of Daily Living In your present state of health, do you have any difficulty performing the following activities: 03/21/2019  Hearing? N  Vision? N  Difficulty concentrating or making decisions? N  Walking or climbing stairs? N  Dressing or bathing? N  Doing errands, shopping? N  Preparing Food and eating ? N  Using the Toilet? N  In the past six months, have you accidently leaked urine? N  Do you have problems with loss of bowel control? N  Managing your Medications? N  Managing your Finances? N  Housekeeping or managing your Housekeeping? N  Some recent data might be hidden    Patient Care Team: Mosie Lukes, MD as PCP - General (Family Medicine) Juanita Craver, MD as Consulting Physician (Gastroenterology) Sel[place], Lois Huxley, MD (Ophthalmology) Sheard, Briscoe Burns, DPM (Inactive) as Consulting Physician (Podiatry) Iona Beard, OD as Referring Physician (Optometry) Charlies Silvers, MD as Consulting Physician (Allergy and Immunology) Lavonia Drafts, MD as Consulting Physician (Obstetrics and Gynecology) Elsie Saas, MD as Consulting Physician (Orthopedic Surgery)    Assessment:   This is a routine wellness examination for [facility]. Physical assessment deferred to PCP.  Exercise Activities and Dietary recommendations Current Exercise Habits: Home exercise routine, Type of exercise: stretching(and stationary bike), Time (Minutes): 15, Frequency (Times/Week): 2, Weekly Exercise (Minutes/Week): 30, Intensity: Mild, Exercise limited by: None identified Diet (meal preparation, eat  out, water intake, caffeinated beverages, dairy products, fruits and vegetables): well balanced    Goals    . Increase physical activity    . Lose 10 lbs by next year (pt-stated)       Fall Risk Fall Risk  03/21/2019 05/17/2018 03/11/2018 03/10/2017 02/25/2016  Falls in the past year? 0 0 0 No No  Number falls in past yr: 0 - - - -  Injury with Fall? 0 - - - -  Follow up Education provided;Falls prevention discussed - - - -   Depression Screen PHQ 2/9 Scores 05/17/2018 03/11/2018 03/10/2017 02/25/2016  PHQ - 2 Score 0 0 0 0     Cognitive Function Ad8 score reviewed for issues:  Issues making decisions:no  Less interest in hobbies / activities:no  Repeats questions, stories (family complaining):no  Trouble using ordinary gadgets (microwave, computer, phone):no  Forgets the month or year: no  Mismanaging finances: no  Remembering appts:no  Daily problems with thinking and/or memory:no Ad8 score is=0     MMSE - Mini Mental State Exam 03/10/2017 02/25/2016 02/22/2015  Orientation to time 5 5 5   Orientation to Place 5 5 5   Registration 3 3 3  Attention/ Calculation 5 5 5   Recall 3 3 1   Language- name 2 objects 2 2 2   Language- repeat 1 1 1   Language- follow 3 step command 3 3 3   Language- read & follow direction 1 1 1   Write a sentence 1 1 1   Copy design 1 0 1  Total score 30 29 28         Immunization History  Administered Date(s) Administered  . Fluad Quad(high Dose 65+) 12/17/2018  . Influenza Whole 01/28/2017  . Influenza, High Dose Seasonal PF 01/21/2016, 02/04/2017, 01/28/2018  . Influenza,inj,Quad PF,6+ Mos 01/04/2013, 01/23/2014, 12/28/2014  . Pneumococcal Conjugate-13 12/28/2014  . Pneumococcal Polysaccharide-23 05/12/2012, 10/27/2017  . Tdap 05/02/2014  . Zoster Recombinat (Shingrix) 01/15/2018, 03/25/2018   Screening Tests Health Maintenance  Topic Date Due  . OPHTHALMOLOGY EXAM  10/27/2018  . FOOT EXAM  10/28/2018  . HEMOGLOBIN A1C   05/21/2019  . MAMMOGRAM  10/24/2020  . COLONOSCOPY  07/20/2021  . TETANUS/TDAP  05/02/2024  . INFLUENZA VACCINE  Completed  . DEXA SCAN  Completed  . Hepatitis C Screening  Completed  . PNA vac Low Risk Adult  Completed      Plan:   See you next year!  Continue to eat heart healthy diet (full of fruits, vegetables, whole grains, lean protein, water--limit salt, fat, and sugar intake) and increase physical activity as tolerated.  Continue doing brain stimulating activities (puzzles, reading, adult coloring books, staying active) to keep memory sharp.    I have personally reviewed and noted the following in the patient's chart:   . Medical and social history . Use of alcohol, tobacco or illicit drugs  . Current medications and supplements . Functional ability and status . Nutritional status . Physical activity . Advanced directives . List of other physicians . Hospitalizations, surgeries, and ER visits in previous 12 months . Vitals . Screenings to include cognitive, depression, and falls . Referrals and appointments  In addition, I have reviewed and discussed with patient certain preventive protocols, quality metrics, and best practice recommendations. A written personalized care plan for preventive services as well as general preventive health recommendations were provided to patient.     Shela Nevin, [place]  03/21/2019

## 2019-03-21 ENCOUNTER — Encounter: Payer: Self-pay | Admitting: *Deleted

## 2019-03-21 ENCOUNTER — Encounter: Payer: Self-pay | Admitting: Family Medicine

## 2019-03-21 ENCOUNTER — Ambulatory Visit (INDEPENDENT_AMBULATORY_CARE_PROVIDER_SITE_OTHER): Payer: Medicare Other | Admitting: Family Medicine

## 2019-03-21 ENCOUNTER — Other Ambulatory Visit: Payer: Self-pay

## 2019-03-21 ENCOUNTER — Ambulatory Visit (INDEPENDENT_AMBULATORY_CARE_PROVIDER_SITE_OTHER): Payer: Medicare Other | Admitting: *Deleted

## 2019-03-21 VITALS — BP 120/70 | HR 69 | Temp 98.6°F | Resp 18 | Wt 172.2 lb

## 2019-03-21 DIAGNOSIS — G6289 Other specified polyneuropathies: Secondary | ICD-10-CM

## 2019-03-21 DIAGNOSIS — Z780 Asymptomatic menopausal state: Secondary | ICD-10-CM

## 2019-03-21 DIAGNOSIS — E1140 Type 2 diabetes mellitus with diabetic neuropathy, unspecified: Secondary | ICD-10-CM | POA: Diagnosis not present

## 2019-03-21 DIAGNOSIS — R109 Unspecified abdominal pain: Secondary | ICD-10-CM

## 2019-03-21 DIAGNOSIS — E782 Mixed hyperlipidemia: Secondary | ICD-10-CM

## 2019-03-21 DIAGNOSIS — Z0000 Encounter for general adult medical examination without abnormal findings: Secondary | ICD-10-CM | POA: Diagnosis not present

## 2019-03-21 DIAGNOSIS — L84 Corns and callosities: Secondary | ICD-10-CM

## 2019-03-21 DIAGNOSIS — E039 Hypothyroidism, unspecified: Secondary | ICD-10-CM

## 2019-03-21 DIAGNOSIS — K219 Gastro-esophageal reflux disease without esophagitis: Secondary | ICD-10-CM

## 2019-03-21 DIAGNOSIS — R300 Dysuria: Secondary | ICD-10-CM

## 2019-03-21 DIAGNOSIS — E785 Hyperlipidemia, unspecified: Secondary | ICD-10-CM | POA: Diagnosis not present

## 2019-03-21 DIAGNOSIS — I10 Essential (primary) hypertension: Secondary | ICD-10-CM

## 2019-03-21 LAB — URINALYSIS
Bilirubin Urine: NEGATIVE
Hgb urine dipstick: NEGATIVE
Ketones, ur: NEGATIVE
Leukocytes,Ua: NEGATIVE
Nitrite: NEGATIVE
Specific Gravity, Urine: <=1.005 — AB (ref 1.000–1.030)
Total Protein, Urine: NEGATIVE
Urine Glucose: NEGATIVE
Urobilinogen, UA: 0.2 (ref 0.0–1.0)
pH: 6.5 (ref 5.0–8.0)

## 2019-03-21 LAB — COMPREHENSIVE METABOLIC PANEL
ALT: 19 U/L (ref 0–35)
AST: 17 U/L (ref 0–37)
Albumin: 4.3 g/dL (ref 3.5–5.2)
Alkaline Phosphatase: 60 U/L (ref 39–117)
BUN: 12 mg/dL (ref 6–23)
CO2: 30 mEq/L (ref 19–32)
Calcium: 9.7 mg/dL (ref 8.4–10.5)
Chloride: 102 mEq/L (ref 96–112)
Creatinine, Ser: 0.87 mg/dL (ref 0.40–1.20)
GFR: 78.04 mL/min (ref >60.00)
Glucose, Bld: 100 mg/dL — ABNORMAL HIGH (ref 70–99)
Potassium: 4.3 mEq/L (ref 3.5–5.1)
Sodium: 140 mEq/L (ref 135–145)
Total Bilirubin: 0.4 mg/dL (ref 0.2–1.2)
Total Protein: 7 g/dL (ref 6.0–8.3)

## 2019-03-21 LAB — CBC
HCT: 40.1 % (ref 36.0–46.0)
Hemoglobin: 13 g/dL (ref 12.0–15.0)
MCHC: 32.3 g/dL (ref 30.0–36.0)
MCV: 94.1 fl (ref 78.0–100.0)
Platelets: 282 10*3/uL (ref 150.0–400.0)
RBC: 4.27 Mil/uL (ref 3.87–5.11)
RDW: 13.6 % (ref 11.5–15.5)
WBC: 6.7 10*3/uL (ref 4.0–10.5)

## 2019-03-21 LAB — TSH: TSH: 0.55 u[IU]/mL (ref 0.35–4.50)

## 2019-03-21 LAB — LIPID PANEL
Cholesterol: 191 mg/dL (ref 0–200)
HDL: 43.5 mg/dL (ref >39.00)
LDL Cholesterol: 113 mg/dL — ABNORMAL HIGH (ref 0–99)
NonHDL: 147.04
Total CHOL/HDL Ratio: 4
Triglycerides: 168 mg/dL — ABNORMAL HIGH (ref 0.0–149.0)
VLDL: 33.6 mg/dL (ref 0.0–40.0)

## 2019-03-21 LAB — HEMOGLOBIN A1C: Hgb A1c MFr Bld: 6.6 % — ABNORMAL HIGH (ref 4.6–6.5)

## 2019-03-21 MED ORDER — METFORMIN HCL ER 750 MG PO TB24: ORAL_TABLET | ORAL | 1 refills | Status: DC

## 2019-03-21 MED ORDER — AZELASTINE HCL 0.1 % NA SOLN: 2.0000 | Freq: Two times a day (BID) | NASAL | 12 refills | Status: DC

## 2019-03-21 MED ORDER — GABAPENTIN 300 MG PO CAPS: 300.0000 mg | ORAL_CAPSULE | Freq: Every day | ORAL | 2 refills | Status: DC

## 2019-03-21 MED ORDER — NEXIUM 40 MG PO CPDR: DELAYED_RELEASE_CAPSULE | ORAL | 1 refills | Status: DC

## 2019-03-21 MED ORDER — ATORVASTATIN CALCIUM 40 MG PO TABS: ORAL_TABLET | ORAL | 3 refills | Status: DC

## 2019-03-21 MED ORDER — METOPROLOL SUCCINATE ER 25 MG PO TB24: ORAL_TABLET | ORAL | 1 refills | Status: DC

## 2019-03-21 MED ORDER — LEVOTHYROXINE SODIUM 88 MCG PO TABS: ORAL_TABLET | ORAL | 3 refills | Status: DC

## 2019-03-21 MED ORDER — LEVOCETIRIZINE DIHYDROCHLORIDE 5 MG PO TABS: ORAL_TABLET | ORAL | 3 refills | Status: DC

## 2019-03-21 MED ORDER — SPIRONOLACTONE 25 MG PO TABS: ORAL_TABLET | ORAL | 1 refills | Status: DC

## 2019-03-21 MED ORDER — AVAPRO 150 MG PO TABS: ORAL_TABLET | ORAL | 1 refills | Status: DC

## 2019-03-21 MED FILL — SPIRONOLACTONE 25 MG TABS: 25 | 90 days supply | Qty: 135 | Fill #0

## 2019-03-21 MED FILL — ATORVASTATIN 40 MG TABLET: 40 | 90 days supply | Qty: 66 | Fill #0

## 2019-03-21 MED FILL — NexIUM 40 MG CPDR: 40 | 90 days supply | Qty: 90 | Fill #0

## 2019-03-21 MED FILL — AVAPRO 150 MG TABLET: 150 | 90 days supply | Qty: 90 | Fill #0

## 2019-03-21 NOTE — Assessment & Plan Note (Signed)
On Levothyroxine, continue to monitor 

## 2019-03-21 NOTE — Assessment & Plan Note (Signed)
Encouraged heart healthy diet, increase exercise, avoid trans fats, consider a krill oil cap daily. Tolerating Atorvastatin 

## 2019-03-21 NOTE — Patient Instructions (Addendum)
Add multivitamin with minerals (selenium) Vitamin D 04-1998 IU daily ECASA 81 mg daily     Neuropathic Pain Neuropathic pain is pain caused by damage to the nerves that are responsible for certain sensations in your body (sensory nerves). The pain can be caused by:  Damage to the sensory nerves that send signals to your spinal cord and brain (peripheral nervous system).  Damage to the sensory nerves in your brain or spinal cord (central nervous system). Neuropathic pain can make you more sensitive to pain. Even a minor sensation can feel very painful. This is usually a long-term condition that can be difficult to treat. The type of pain differs from person to person. It may:  Start suddenly (acute), or it may develop slowly and last for a long time (chronic).  Come and go as damaged nerves heal, or it may stay at the same level for years.  Cause emotional distress, loss of sleep, and a lower quality of life. What are the causes? The most common cause of this condition is diabetes. Many other diseases and conditions can also cause neuropathic pain. Causes of neuropathic pain can be classified as:  Toxic. This is caused by medicines and chemicals. The most common cause of toxic neuropathic pain is damage from cancer treatments (chemotherapy).  Metabolic. This can be caused by: ? Diabetes. This is the most common disease that damages the nerves. ? Lack of vitamin B from long-term alcohol abuse.  Traumatic. Any injury that cuts, crushes, or stretches a nerve can cause damage and pain. A common example is feeling pain after losing an arm or leg (phantom limb pain).  Compression-related. If a sensory nerve gets trapped or compressed for a long period of time, the blood supply to the nerve can be cut off.  Vascular. Many blood vessel diseases can cause neuropathic pain by decreasing blood supply and oxygen to nerves.  Autoimmune. This type of pain results from diseases in which the body's  defense system (immune system) mistakenly attacks sensory nerves. Examples of autoimmune diseases that can cause neuropathic pain include lupus and multiple sclerosis.  Infectious. Many types of viral infections can damage sensory nerves and cause pain. Shingles infection is a common cause of this type of pain.  Inherited. Neuropathic pain can be a symptom of many diseases that are passed down through families (genetic). What increases the risk? You are more likely to develop this condition if:  You have diabetes.  You smoke.  You drink too much alcohol.  You are taking certain medicines, including medicines that kill cancer cells (chemotherapy) or that treat immune system disorders. What are the signs or symptoms? The main symptom is pain. Neuropathic pain is often described as:  Burning.  Shock-like.  Stinging.  Hot or cold.  Itching. How is this diagnosed? No single test can diagnose neuropathic pain. It is diagnosed based on:  Physical exam and your symptoms. Your health care provider will ask you about your pain. You may be asked to use a pain scale to describe how bad your pain is.  Tests. These may be done to see if you have a high sensitivity to pain and to help find the cause and location of any sensory nerve damage. They include: ? Nerve conduction studies to test how well nerve signals travel through your sensory nerves (electrodiagnostic testing). ? Stimulating your sensory nerves through electrodes on your skin and measuring the response in your spinal cord and brain (somatosensory evoked potential).  Imaging studies, such  as: ? X-rays. ? CT scan. ? MRI. How is this treated? Treatment for neuropathic pain may change over time. You may need to try different treatment options or a combination of treatments. Some options include:  Treating the underlying cause of the neuropathy, such as diabetes, kidney disease, or vitamin deficiencies.  Stopping medicines that  can cause neuropathy, such as chemotherapy.  Medicine to relieve pain. Medicines may include: ? Prescription or over-the-counter pain medicine. ? Anti-seizure medicine. ? Antidepressant medicines. ? Pain-relieving patches that are applied to painful areas of skin. ? A medicine to numb the area (local anesthetic), which can be injected as a nerve block.  Transcutaneous nerve stimulation. This uses electrical currents to block painful nerve signals. The treatment is painless.  Alternative treatments, such as: ? Acupuncture. ? Meditation. ? Massage. ? Physical therapy. ? Pain management programs. ? Counseling. Follow these instructions at home: Medicines   Take over-the-counter and prescription medicines only as told by your health care provider.  Do not drive or use heavy machinery while taking prescription pain medicine.  If you are taking prescription pain medicine, take actions to prevent or treat constipation. Your health care provider may recommend that you: ? Drink enough fluid to keep your urine pale yellow. ? Eat foods that are high in fiber, such as fresh fruits and vegetables, whole grains, and beans. ? Limit foods that are high in fat and processed sugars, such as fried or sweet foods. ? Take an over-the-counter or prescription medicine for constipation. Lifestyle   Have a good support system at home.  Consider joining a chronic pain support group.  Do not use any products that contain nicotine or tobacco, such as cigarettes and e-cigarettes. If you need help quitting, ask your health care provider.  Do not drink alcohol. General instructions  Learn as much as you can about your condition.  Work closely with all your health care providers to find the treatment plan that works best for you.  Ask your health care provider what activities are safe for you.  Keep all follow-up visits as told by your health care provider. This is important. Contact a health care  provider if:  Your pain treatments are not working.  You are having side effects from your medicines.  You are struggling with tiredness (fatigue), mood changes, depression, or anxiety. Summary  Neuropathic pain is pain caused by damage to the nerves that are responsible for certain sensations in your body (sensory nerves).  Neuropathic pain may come and go as damaged nerves heal, or it may stay at the same level for years.  Neuropathic pain is usually a long-term condition that can be difficult to treat. Consider joining a chronic pain support group. This information is not intended to replace advice given to you by your health care provider. Make sure you discuss any questions you have with your health care provider. Document Released: 01/03/2004 Document Revised: 07/29/2018 Document Reviewed: 04/24/2017 Elsevier Patient Education  2020 [employer].

## 2019-03-21 NOTE — Assessment & Plan Note (Signed)
Has burning in her feet managed by Gabapentin is doing fairly well. Forms filled out for diabetic shoes. She is following with podiatry still but her podiatrist retired and she was not as comfortable with him so will consider transfer to [place] next year.

## 2019-03-21 NOTE — Addendum Note (Signed)
Addended by: Naaman Plummer A on: 03/21/2019 11:24 AM   Modules accepted: Orders

## 2019-03-21 NOTE — Progress Notes (Signed)
Subjective:    Patient ID: Victoria Walker, female    DOB: [birth year], 68 y.o.   MRN: [id number]  No chief complaint on file.   HPI Patient is in today for follow up on chronic medical concerns including peripheral neuropathy, diabetes, hypertension and more. No recent febrile illness or hospitalizations. She is staying in and maintaining quarantine well. She notes her sugars are doing well generally under 120. Today she was 122 but she had eaten a peanut butter cracker. Denies CP/palp/SOB/HA/congestion/fevers/GI or GU c/o. Taking meds as prescribed  Past Medical History:  Diagnosis Date   Arthritis    "both knees" (07/02/2015)   Cancer of right breast (HCC)    Claustrophobia    Dysrhythmia    HX OF FAST HEART RATE AND PALPITATIONS - METOPROLOL HAS HELPED   GERD (gastroesophageal reflux disease)    H/O hiatal hernia    H/O iritis    LEFT EYE - STATES HER EYE BECOMES RED AND VERY SENSITIVE TO LIGHT WHEN FLARE UP OF IRITIS   Heart murmur    "benign; 06/2015"   High cholesterol    History of blood transfusion    "when I had my partial hysterectomy"   History of chickenpox 09/26/2014   History of measles    History of MRSA infection    History of shingles    Hypertension    Hypothyroidism    Medicare annual wellness visit, initial 04/07/2015   Multinodular thyroid    PT HAVING TROUBLE SWALLOWING   Neuropathy    FEET   Primary localized osteoarthritis of right knee    Sciatica of right side 04/25/2015   Spinal headache    spinal headache with epidural   Total knee replacement status, right    Type II diabetes mellitus ([place])    ORAL MEDICATION - NO INSULIN   Wears glasses     Past Surgical History:  Procedure Laterality Date   ABDOMINAL HYSTERECTOMY  1988   ABDOMINOPLASTY  1986   ANTERIOR AND POSTERIOR REPAIR  05/11/2012   Procedure: ANTERIOR (CYSTOCELE) AND POSTERIOR REPAIR (RECTOCELE);  Surgeon: Reece Packer, MD;  Location: [facility];   Service: Urology;;  with graft   BREAST BIOPSY Right    CARDIAC CATHETERIZATION  1999   COLONOSCOPY     CYSTOSCOPY WITH URETHRAL DILATATION  05/11/2012   Procedure: CYSTOSCOPY WITH URETHRAL DILATATION;  Surgeon: Reece Packer, MD;  Location: [facility];  Service: Urology;;   ESOPHAGOGASTRODUODENOSCOPY     HERNIA REPAIR     MASTECTOMY Right 2007   PLANTAR FASCIA SURGERY Left    THYROIDECTOMY N/A 02/23/2014   Procedure: TOTAL THYROIDECTOMY;  Surgeon: Armandina Gemma, MD;  Location: [facility];  Service: General;  Laterality: N/A;   TOTAL KNEE ARTHROPLASTY Right 07/02/2015   TOTAL KNEE ARTHROPLASTY Right 07/02/2015   Procedure: TOTAL KNEE ARTHROPLASTY;  Surgeon: Elsie Saas, MD;  Location: [facility];  Service: Orthopedics;  Laterality: Right;   TOTAL THYROIDECTOMY  1976   removed three tumor (2 on right; 1 on left)   TUBAL LIGATION     UMBILICAL HERNIA REPAIR  1986   VAGINAL PROLAPSE REPAIR  05/11/2012   Procedure: VAGINAL VAULT SUSPENSION;  Surgeon: Reece Packer, MD;  Location: [facility];  Service: Urology;;   VULVA / PERINEUM BIOPSY     vulvar    Family History  Problem Relation Age of Onset   Hypertension Mother    Heart disease Mother        pacer, CHF  Hyperlipidemia Mother    Kidney disease Mother        1 kidney due to stone   Kidney Stones Mother    Hypertension Father    Cancer Father        lung, psa   Diabetes Father    Alcohol abuse Father    Diabetes Sister    Cancer Sister    Hypertension Sister    Diabetes Brother    Hypertension Brother    Hyperlipidemia Brother    Kidney disease Brother        dialysis   Stroke Brother    Hypertension Maternal Grandmother    Heart disease Maternal Grandmother    Stroke Maternal Grandmother    Hypertension Maternal Grandfather    Hypertension Daughter    Arthritis Son    Hyperlipidemia Sister    Hypertension Sister    Diabetes Sister    Thyroid disease Sister    Kidney disease  Sister    Cancer Sister        thyroid and breast   Endometriosis Sister    Arthritis Sister        back pain   Hypertension Sister    Thyroid disease Sister    Hypertension Brother    Hyperlipidemia Brother    Hypertension Brother    Hyperlipidemia Brother    Benign prostatic hyperplasia Brother    Hypertension Daughter    Hyperlipidemia Daughter    Diabetes Daughter    Hypertension Maternal Aunt    Hypertension Maternal Uncle    Hypertension Paternal Aunt    Cancer Paternal Aunt    Hypertension Paternal Uncle     Social History   Socioeconomic History   Marital status: Married    Spouse name: Not on file   Number of children: 3   Years of education: Not on file   Highest education level: Not on file  Occupational History    Comment: Retired  Scientist, product/process development strain: Not on file   Food insecurity    Worry: Not on file    Inability: Not on Lexicographer needs    Medical: Not on file    Non-medical: Not on file  Tobacco Use   Smoking status: Never Smoker   Smokeless tobacco: Never Used  Substance and Sexual Activity   Alcohol use: No    Alcohol/week: 0.0 standard drinks   Drug use: No   Sexual activity: Not Currently    Birth control/protection: Surgical    Comment: lives with husband, retired from various jobs, diabetic diet  Lifestyle   Physical activity    Days per week: Not on file    Minutes per session: Not on file   Stress: Not on file  Relationships   Social connections    Talks on phone: Not on file    Gets together: Not on file    Attends religious service: Not on file    Active member of club or organization: Not on file    Attends meetings of clubs or organizations: Not on file    Relationship status: Not on file   Intimate partner violence    Fear of current or ex partner: Not on file    Emotionally abused: Not on file    Physically abused: Not on file    Forced sexual activity:  Not on file  Other Topics Concern   Not on file  Social History Narrative   Not on file  Outpatient Medications Prior to Visit  Medication Sig Dispense Refill   aspirin 81 MG EC tablet Take 81 mg by mouth every other day. Swallow whole.     calcium-vitamin D (OSCAL WITH D) 250-125 MG-UNIT per tablet Take 1 tablet by mouth daily.      Cholecalciferol (VITAMIN D) 2000 UNITS CAPS Take 1 capsule by mouth every evening.      Ciclopirox 1 % shampoo Apply 1 each topically every Friday.      clindamycin (CLEOCIN) 300 MG capsule Take 300 mg by mouth daily as needed (one hour before dental work).      cycloSPORINE (RESTASIS) 0.05 % ophthalmic emulsion Place 1 drop into both eyes 2 (two) times daily.     diltiazem (TIAZAC) 360 MG 24 hr capsule TAKE 1 CAPSULE (360 MG TOTAL) BY MOUTH EVERY MORNING. 90 capsule 3   DOCOSAHEXAENOIC ACID PO Take by mouth.     Estradiol (YUVAFEM) 10 MCG TABS vaginal tablet INSERT 1 TAB VAGINALLY AT BEDTIME EVERY OTHER DAY 16 tablet 12   fluconazole (DIFLUCAN) 100 MG tablet Take 1 tablet (100 mg total) by mouth daily. (Patient not taking: Reported on 03/21/2019) 2 tablet 2   glucose blood (ONE TOUCH TEST STRIPS) test strip Use as directed twice daily to check blood sugar. DX code E11.9 300 each 1   loteprednol (LOTEMAX) 0.5 % ophthalmic suspension Place 1 drop into the left eye daily as needed ("arthiritis of the eye").      mometasone (NASONEX) 50 MCG/ACT nasal spray Two sprays each nostril once a day if needed for nasal congestion or drainage. 51 g 3   polyvinyl alcohol-povidone (REFRESH) 1.4-0.6 % ophthalmic solution Place 1 drop into both eyes 2 (two) times daily.     Probiotic Product (PROBIOTIC DAILY PO) Take 1 capsule by mouth daily.     tretinoin (RETIN-A) 0.025 % cream APPLY 1 APPLICATION TOPICALLY EVERY DAY  11   atorvastatin (LIPITOR) 40 MG tablet TAKE 1 TABLET BY MOUTH EVERY OTHER DAY, ALTERNATING WITH 1/2 TABLET 66 tablet 3   AVAPRO 150 MG  tablet TAKE 1/2 TO 1 (ONE-HALF TO ONE) TABLET BY MOUTH ONCE DAILY 90 tablet 1   azelastine (ASTELIN) 0.1 % nasal spray Place 2 sprays into both nostrils 2 (two) times daily. Use in each nostril as directed 30 mL 12   gabapentin (NEURONTIN) 300 MG capsule TAKE 1 CAPSULE (300 MG TOTAL) BY MOUTH AT BEDTIME. 90 capsule 2   levocetirizine (XYZAL) 5 MG tablet One tablet once a day as needed for runny nose. 90 tablet 3   levothyroxine (SYNTHROID, LEVOTHROID) 88 MCG tablet 1 tab po daily except skip Sunday 90 tablet 3   metFORMIN (GLUCOPHAGE-XR) 750 MG 24 hr tablet TAKE 1 TABLET IN AM AND 1 TABLET AT LUNCH 180 tablet 1   metoprolol succinate (TOPROL-XL) 25 MG 24 hr tablet TAKE 1 TABLET BY MOUTH TWICE A DAY AT 12 NOON AND 4PM 180 tablet 1   NEXIUM 40 MG capsule Take 1 capsule by mouth in the morning 90 capsule 1   spironolactone (ALDACTONE) 25 MG tablet TAEK 1 & 1/2 TAB BY MOUTH EVERY DAY 135 tablet 1   No facility-administered medications prior to visit.     Allergies  Allergen Reactions   Codeine Anxiety    Jittery    Penicillins Itching   Sulfa Antibiotics Itching   Vicodin [Hydrocodone-Acetaminophen] Itching    ITCHING     Review of Systems  Constitutional: Negative for fever and malaise/fatigue.  HENT: Negative for congestion.   Eyes: Negative for blurred vision.  Respiratory: Negative for shortness of breath.   Cardiovascular: Negative for chest pain, palpitations and leg swelling.  Gastrointestinal: Negative for abdominal pain, blood in stool and nausea.  Genitourinary: Negative for dysuria and frequency.  Musculoskeletal: Positive for joint pain. Negative for falls.  Skin: Negative for rash.  Neurological: Positive for sensory change. Negative for dizziness, loss of consciousness and headaches.  Endo/Heme/Allergies: Negative for environmental allergies.  Psychiatric/Behavioral: Negative for depression. The patient is not nervous/anxious.        Objective:      Physical Exam Vitals signs and nursing note reviewed.  Constitutional:      General: She is not in acute distress.    Appearance: She is well-developed.  HENT:     Head: Normocephalic and atraumatic.     Nose: Nose normal.  Eyes:     General:        Right eye: No discharge.        Left eye: No discharge.  Neck:     Musculoskeletal: Normal range of motion and neck supple.  Cardiovascular:     Rate and Rhythm: Normal rate and regular rhythm.     Heart sounds: No murmur.  Pulmonary:     Effort: Pulmonary effort is normal.     Breath sounds: Normal breath sounds.  Abdominal:     General: Bowel sounds are normal.     Palpations: Abdomen is soft.     Tenderness: There is no abdominal tenderness.  Skin:    General: Skin is warm and dry.  Neurological:     Mental Status: She is alert and oriented to person, place, and time.     BP 120/70 (BP Location: Left Arm, Patient Position: Sitting, Cuff Size: Normal)    Pulse 69    Temp 98.6 F (37 C) (Temporal)    Resp 18    Wt 172 lb 3.2 oz (78.1 kg)    SpO2 99%    BMI 28.66 kg/m  Wt Readings from Last 3 Encounters:  03/21/19 172 lb 3.2 oz (78.1 kg)  11/18/18 170 lb 9.6 oz (77.4 kg)  08/05/18 174 lb (78.9 kg)    Diabetic Foot Exam - Simple   No data filed     Lab Results  Component Value Date   WBC 6.2 01/05/2019   HGB 12.7 01/05/2019   HCT 39.2 01/05/2019   PLT 276.0 01/05/2019   GLUCOSE 97 01/05/2019   CHOL 151 11/18/2018   TRIG 131.0 11/18/2018   HDL 43.10 11/18/2018   LDLDIRECT 107.0 07/01/2016   LDLCALC 81 11/18/2018   ALT 17 01/05/2019   AST 15 01/05/2019   NA 142 01/05/2019   K 4.6 01/05/2019   CL 106 01/05/2019   CREATININE 0.92 01/05/2019   BUN 13 01/05/2019   CO2 29 01/05/2019   TSH 0.47 11/18/2018   INR 1.01 06/21/2015   HGBA1C 6.6 (H) 11/18/2018   MICROALBUR <0.7 08/02/2018    Lab Results  Component Value Date   TSH 0.47 11/18/2018   Lab Results  Component Value Date   WBC 6.2 01/05/2019   HGB  12.7 01/05/2019   HCT 39.2 01/05/2019   MCV 94.6 01/05/2019   PLT 276.0 01/05/2019   Lab Results  Component Value Date   NA 142 01/05/2019   K 4.6 01/05/2019   CO2 29 01/05/2019   GLUCOSE 97 01/05/2019   BUN 13 01/05/2019   CREATININE 0.92 01/05/2019  BILITOT 0.5 01/05/2019   ALKPHOS 57 01/05/2019   AST 15 01/05/2019   ALT 17 01/05/2019   PROT 6.9 01/05/2019   ALBUMIN 4.4 01/05/2019   CALCIUM 9.4 01/05/2019   ANIONGAP 9 07/04/2015   GFR 73.21 01/05/2019   Lab Results  Component Value Date   CHOL 151 11/18/2018   Lab Results  Component Value Date   HDL 43.10 11/18/2018   Lab Results  Component Value Date   LDLCALC 81 11/18/2018   Lab Results  Component Value Date   TRIG 131.0 11/18/2018   Lab Results  Component Value Date   CHOLHDL 3 11/18/2018   Lab Results  Component Value Date   HGBA1C 6.6 (H) 11/18/2018       Assessment & Plan:   Problem List Items Addressed This Visit    Type 2 diabetes mellitus with diabetic neuropathy, without long-term current use of insulin (HCC)    hgba1c acceptable, minimize simple carbs. Increase exercise as tolerated. Continue current meds, sugars have been well controlled and generally below 120      Relevant Medications   AVAPRO 150 MG tablet   metFORMIN (GLUCOPHAGE-XR) 750 MG 24 hr tablet   atorvastatin (LIPITOR) 40 MG tablet   Other Relevant Orders   A1C   Essential hypertension    Well controlled, no changes to meds. Encouraged heart healthy diet such as the DASH diet and exercise as tolerated.       Relevant Medications   gabapentin (NEURONTIN) 300 MG capsule   AVAPRO 150 MG tablet   metoprolol succinate (TOPROL-XL) 25 MG 24 hr tablet   atorvastatin (LIPITOR) 40 MG tablet   spironolactone (ALDACTONE) 25 MG tablet   Hyperlipidemia, mixed - Primary    Encouraged heart healthy diet, increase exercise, avoid trans fats, consider a krill oil cap daily. Tolerating Atorvastatin      Relevant Medications   AVAPRO  150 MG tablet   metoprolol succinate (TOPROL-XL) 25 MG 24 hr tablet   atorvastatin (LIPITOR) 40 MG tablet   spironolactone (ALDACTONE) 25 MG tablet   Hypothyroid    On Levothyroxine, continue to monitor      Relevant Medications   metoprolol succinate (TOPROL-XL) 25 MG 24 hr tablet   levothyroxine (SYNTHROID) 88 MCG tablet   Peripheral neuropathy    Has burning in her feet managed by Gabapentin is doing fairly well. Forms filled out for diabetic shoes. She is following with podiatry still but her podiatrist retired and she was not as comfortable with him so will consider transfer to [place] next year.       Relevant Medications   gabapentin (NEURONTIN) 300 MG capsule   Pre-ulcerative corn or callous   Dysuria   Relevant Orders   Urine culture   Urinalysis (LAB COLLECT)    Other Visit Diagnoses    Postmenopausal estrogen deficiency       Relevant Medications   gabapentin (NEURONTIN) 300 MG capsule   Hyperlipidemia, unspecified hyperlipidemia type       Relevant Medications   gabapentin (NEURONTIN) 300 MG capsule   AVAPRO 150 MG tablet   metoprolol succinate (TOPROL-XL) 25 MG 24 hr tablet   atorvastatin (LIPITOR) 40 MG tablet   spironolactone (ALDACTONE) 25 MG tablet   Other Relevant Orders   Lipid panel   Essential hypertension, benign       Relevant Medications   gabapentin (NEURONTIN) 300 MG capsule   AVAPRO 150 MG tablet   metoprolol succinate (TOPROL-XL) 25 MG 24 hr tablet  atorvastatin (LIPITOR) 40 MG tablet   spironolactone (ALDACTONE) 25 MG tablet   Other Relevant Orders   CBC   CMP   TSH   Gastroesophageal reflux disease       Relevant Medications   gabapentin (NEURONTIN) 300 MG capsule   NEXIUM 40 MG capsule   Abdominal pain, unspecified abdominal location       Relevant Medications   gabapentin (NEURONTIN) 300 MG capsule      I have changed Latrenda I. Achterberg's levothyroxine. I am also having her maintain her calcium-vitamin D, loteprednol,  Ciclopirox, cycloSPORINE, polyvinyl alcohol-povidone, Vitamin D, Probiotic Product (PROBIOTIC DAILY PO), aspirin, DOCOSAHEXAENOIC ACID PO, tretinoin, glucose blood, clindamycin, mometasone, diltiazem, fluconazole, Estradiol, gabapentin, Avapro, levocetirizine, metoprolol succinate, metFORMIN, atorvastatin, azelastine, spironolactone, and NexIUM.  Meds ordered this encounter  Medications   gabapentin (NEURONTIN) 300 MG capsule    Sig: Take 1 capsule (300 mg total) by mouth at bedtime.    Dispense:  90 capsule    Refill:  2   AVAPRO 150 MG tablet    Sig: TAKE 1/2 TO 1 (ONE-HALF TO ONE) TABLET BY MOUTH ONCE DAILY    Dispense:  90 tablet    Refill:  1   levocetirizine (XYZAL) 5 MG tablet    Sig: One tablet once a day as needed for runny nose.    Dispense:  90 tablet    Refill:  3   metoprolol succinate (TOPROL-XL) 25 MG 24 hr tablet    Sig: TAKE 1 TABLET BY MOUTH TWICE A DAY AT 12 NOON AND 4PM    Dispense:  180 tablet    Refill:  1   metFORMIN (GLUCOPHAGE-XR) 750 MG 24 hr tablet    Sig: TAKE 1 TABLET IN AM AND 1 TABLET AT LUNCH    Dispense:  180 tablet    Refill:  1   atorvastatin (LIPITOR) 40 MG tablet    Sig: TAKE 1 TABLET BY MOUTH EVERY OTHER DAY, ALTERNATING WITH 1/2 TABLET    Dispense:  66 tablet    Refill:  3   azelastine (ASTELIN) 0.1 % nasal spray    Sig: Place 2 sprays into both nostrils 2 (two) times daily. Use in each nostril as directed    Dispense:  30 mL    Refill:  12   spironolactone (ALDACTONE) 25 MG tablet    Sig: TAEK 1 & 1/2 TAB BY MOUTH EVERY DAY    Dispense:  135 tablet    Refill:  1   NEXIUM 40 MG capsule    Sig: Take 1 capsule by mouth in the morning    Dispense:  90 capsule    Refill:  1   levothyroxine (SYNTHROID) 88 MCG tablet    Sig: 1 tab po daily except skip Sunday    Dispense:  90 tablet    Refill:  3     Penni Homans, MD

## 2019-03-21 NOTE — Assessment & Plan Note (Signed)
hgba1c acceptable, minimize simple carbs. Increase exercise as tolerated. Continue current meds, sugars have been well controlled and generally below 120

## 2019-03-21 NOTE — Assessment & Plan Note (Signed)
Well controlled, no changes to meds. Encouraged heart healthy diet such as the DASH diet and exercise as tolerated.  °

## 2019-03-21 NOTE — Patient Instructions (Signed)
See you next year!  Continue to eat heart healthy diet (full of fruits, vegetables, whole grains, lean protein, water--limit salt, fat, and sugar intake) and increase physical activity as tolerated.  Continue doing brain stimulating activities (puzzles, reading, adult coloring books, staying active) to keep memory sharp.    Victoria Walker , Thank you for taking time to come for your Medicare Wellness Visit. I appreciate your ongoing commitment to your health goals. Please review the following plan we discussed and let me know if I can assist you in the future.   These are the goals we discussed: Goals    . Increase physical activity       This is a list of the screening recommended for you and due dates:  Health Maintenance  Topic Date Due  . Eye exam for diabetics  10/27/2018  . Complete foot exam   10/28/2018  . Hemoglobin A1C  05/21/2019  . Mammogram  10/24/2020  . Colon Cancer Screening  07/20/2021  . Tetanus Vaccine  05/02/2024  . Flu Shot  Completed  . DEXA scan (bone density measurement)  Completed  .  Hepatitis C: One time screening is recommended by Center for Disease Control  (CDC) for  adults born from [birth year] through 1966.   Completed  . Pneumonia vaccines  Completed    Preventive Care 66 Years and Older, Female Preventive care refers to lifestyle choices and visits with your health care provider that can promote health and wellness. This includes:  A yearly physical exam. This is also called an annual well check.  Regular dental and eye exams.  Immunizations.  Screening for certain conditions.  Healthy lifestyle choices, such as diet and exercise. What can I expect for my preventive care visit? Physical exam Your health care provider will check:  Height and weight. These may be used to calculate body mass index (BMI), which is a measurement that tells if you are at a healthy weight.  Heart rate and blood pressure.  Your skin for abnormal spots. Counseling  Your health care provider may ask you questions about:  Alcohol, tobacco, and drug use.  Emotional well-being.  Home and relationship well-being.  Sexual activity.  Eating habits.  History of falls.  Memory and ability to understand (cognition).  Work and work Statistician.  Pregnancy and menstrual history. What immunizations do I need?  Influenza (flu) vaccine  This is recommended every year. Tetanus, diphtheria, and pertussis (Tdap) vaccine  You may need a Td booster every 10 years. Varicella (chickenpox) vaccine  You may need this vaccine if you have not already been vaccinated. Zoster (shingles) vaccine  You may need this after age 81. Pneumococcal conjugate (PCV13) vaccine  One dose is recommended after age 47. Pneumococcal polysaccharide (PPSV23) vaccine  One dose is recommended after age 73. Measles, mumps, and rubella (MMR) vaccine  You may need at least one dose of MMR if you were born in 1957 or later. You may also need a second dose. Meningococcal conjugate (MenACWY) vaccine  You may need this if you have certain conditions. Hepatitis A vaccine  You may need this if you have certain conditions or if you travel or work in places where you may be exposed to hepatitis A. Hepatitis B vaccine  You may need this if you have certain conditions or if you travel or work in places where you may be exposed to hepatitis B. Haemophilus influenzae type b (Hib) vaccine  You may need this if you have certain conditions.  You may receive vaccines as individual doses or as more than one vaccine together in one shot (combination vaccines). Talk with your health care provider about the risks and benefits of combination vaccines. What tests do I need? Blood tests  Lipid and cholesterol levels. These may be checked every 5 years, or more frequently depending on your overall health.  Hepatitis C test.  Hepatitis B test. Screening  Lung cancer screening. You may  have this screening every year starting at age 17 if you have a 30-pack-year history of smoking and currently smoke or have quit within the past 15 years.  Colorectal cancer screening. All adults should have this screening starting at age 27 and continuing until age 40. Your health care provider may recommend screening at age 77 if you are at increased risk. You will have tests every 1-10 years, depending on your results and the type of screening test.  Diabetes screening. This is done by checking your blood sugar (glucose) after you have not eaten for a while (fasting). You may have this done every 1-3 years.  Mammogram. This may be done every 1-2 years. Talk with your health care provider about how often you should have regular mammograms.  BRCA-related cancer screening. This may be done if you have a family history of breast, ovarian, tubal, or peritoneal cancers. Other tests  Sexually transmitted disease (STD) testing.  Bone density scan. This is done to screen for osteoporosis. You may have this done starting at age 37. Follow these instructions at home: Eating and drinking  Eat a diet that includes fresh fruits and vegetables, whole grains, lean protein, and low-fat dairy products. Limit your intake of foods with high amounts of sugar, saturated fats, and salt.  Take vitamin and mineral supplements as recommended by your health care provider.  Do not drink alcohol if your health care provider tells you not to drink.  If you drink alcohol: ? Limit how much you have to 0-1 drink a day. ? Be aware of how much alcohol is in your drink. In the [place] one drink equals one 12 oz bottle of beer (355 mL), one 5 oz glass of wine (148 mL), or one 1 oz glass of hard liquor (44 mL). Lifestyle  Take daily care of your teeth and gums.  Stay active. Exercise for at least 30 minutes on 5 or more days each week.  Do not use any products that contain nicotine or tobacco, such as cigarettes,  e-cigarettes, and chewing tobacco. If you need help quitting, ask your health care provider.  If you are sexually active, practice safe sex. Use a condom or other form of protection in order to prevent STIs (sexually transmitted infections).  Talk with your health care provider about taking a low-dose aspirin or statin. What's next?  Go to your health care provider once a year for a well check visit.  Ask your health care provider how often you should have your eyes and teeth checked.  Stay up to date on all vaccines. This information is not intended to replace advice given to you by your health care provider. Make sure you discuss any questions you have with your health care provider. Document Released: 05/04/2015 Document Revised: 04/01/2018 Document Reviewed: 04/01/2018 Elsevier Patient Education  2020 [employer].

## 2019-03-22 LAB — URINE CULTURE
MICRO NUMBER:: 1145795
SPECIMEN QUALITY:: ADEQUATE

## 2019-03-23 ENCOUNTER — Encounter: Payer: Self-pay | Admitting: Family Medicine

## 2019-03-25 ENCOUNTER — Telehealth: Payer: Self-pay | Admitting: Family Medicine

## 2019-03-25 MED ORDER — DILTIAZEM HCL ER BEADS 360 MG PO CP24: ORAL_CAPSULE | ORAL | 3 refills | Status: DC

## 2019-03-25 MED ORDER — LEVOCETIRIZINE DIHYDROCHLORIDE 5 MG PO TABS: ORAL_TABLET | ORAL | 3 refills | Status: DC

## 2019-03-25 MED ORDER — LEVOTHYROXINE SODIUM 88 MCG PO TABS: ORAL_TABLET | ORAL | 3 refills | Status: DC

## 2019-03-25 MED FILL — DILTIAZEM 24HR ER 360 MG CA: 360 | 90 days supply | Qty: 90 | Fill #0

## 2019-03-25 MED FILL — LEVOTHYROXINE 88 MCG TABLET: 88 | 90 days supply | Qty: 77 | Fill #0

## 2019-03-25 NOTE — Telephone Encounter (Signed)
Patient notified rx sent in.

## 2019-03-25 NOTE — Telephone Encounter (Signed)
rx refill levothyroxine (SYNTHROID) 88 MCG tablet   Patient states pharmacy did not get prescription. Please resend, Patient also would like a three months supply.  Pharmacy   [facility], [place] - [place] [phone] (Phone) [phone] (Fax)

## 2019-03-28 ENCOUNTER — Other Ambulatory Visit (HOSPITAL_BASED_OUTPATIENT_CLINIC_OR_DEPARTMENT_OTHER): Payer: Medicare Other

## 2019-03-31 ENCOUNTER — Ambulatory Visit (HOSPITAL_BASED_OUTPATIENT_CLINIC_OR_DEPARTMENT_OTHER)
Admission: RE | Admit: 2019-03-31 | Discharge: 2019-03-31 | Disposition: A | Discharge status: Home / Self Care | Payer: Medicare Other | Source: Ambulatory Visit | Attending: Family Medicine | Admitting: Family Medicine

## 2019-03-31 DIAGNOSIS — Z780 Asymptomatic menopausal state: Secondary | ICD-10-CM | POA: Diagnosis not present

## 2019-03-31 DIAGNOSIS — M8589 Other specified disorders of bone density and structure, multiple sites: Secondary | ICD-10-CM | POA: Diagnosis not present

## 2019-04-04 ENCOUNTER — Encounter: Payer: Self-pay | Admitting: Family Medicine

## 2019-04-08 ENCOUNTER — Other Ambulatory Visit: Payer: Self-pay | Admitting: Family Medicine

## 2019-04-08 NOTE — Telephone Encounter (Signed)
Last OV 03/21/19 Last refill Levothyroxine 03/25/19 #90/3                 Spironolactone 03/21/19 #135/1 Next OV 07/19/19

## 2019-04-12 DIAGNOSIS — E1140 Type 2 diabetes mellitus with diabetic neuropathy, unspecified: Secondary | ICD-10-CM | POA: Diagnosis not present

## 2019-04-26 MED FILL — GABAPENTIN 300 MG CAPSULE: 300 | 90 days supply | Qty: 90 | Fill #0

## 2019-04-26 MED FILL — METOPROLOL SUCCINATE ER 25: 25 | 90 days supply | Qty: 180 | Fill #0

## 2019-04-26 MED FILL — LEVOCETIRIZINE 5 MG TABLET: 5 | 90 days supply | Qty: 90 | Fill #0

## 2019-04-28 ENCOUNTER — Other Ambulatory Visit: Payer: Self-pay | Admitting: Family Medicine

## 2019-04-28 MED FILL — METFORMIN HCL ER 750 MG TB2: 750 | 90 days supply | Qty: 180 | Fill #0

## 2019-05-03 DIAGNOSIS — E1140 Type 2 diabetes mellitus with diabetic neuropathy, unspecified: Secondary | ICD-10-CM | POA: Diagnosis not present

## 2019-05-10 DIAGNOSIS — H40023 Open angle with borderline findings, high risk, bilateral: Secondary | ICD-10-CM | POA: Diagnosis not present

## 2019-05-20 ENCOUNTER — Other Ambulatory Visit: Payer: Self-pay | Admitting: Family Medicine

## 2019-05-25 ENCOUNTER — Other Ambulatory Visit: Payer: Self-pay | Admitting: Family Medicine

## 2019-05-25 MED FILL — NASONEX 50 MCG NASAL SPRAY: 50 | 90 days supply | Qty: 51 | Fill #0

## 2019-05-25 MED FILL — RESTASIS 0.05% EYE EMULSION: 0.05 | 90 days supply | Qty: 180 | Fill #1

## 2019-05-30 DIAGNOSIS — Z9011 Acquired absence of right breast and nipple: Secondary | ICD-10-CM | POA: Diagnosis not present

## 2019-06-01 ENCOUNTER — Telehealth: Payer: Self-pay

## 2019-06-01 NOTE — Telephone Encounter (Signed)
PA initiated via Covermymeds; KEY: [id number]. Awaiting determination.

## 2019-06-01 NOTE — Telephone Encounter (Signed)
PA cancelled.   This medication or product is on your plan's list of covered drugs. Prior authorization is not required at this time. If your pharmacy has questions regarding the processing of your prescription, please have them call the [facility] pharmacy help desk at [phone]. **Please note: Formulary lowering, tiering exception, cost reduction and prospective Medicare hospice reviews cannot be requested using this method of submission. Please contact us at [phone] instead.

## 2019-06-12 ENCOUNTER — Ambulatory Visit: Payer: Medicare Other | Attending: Internal Medicine

## 2019-06-12 DIAGNOSIS — Z23 Encounter for immunization: Secondary | ICD-10-CM | POA: Insufficient documentation

## 2019-06-12 NOTE — Progress Notes (Signed)
   Covid-19 Vaccination Clinic  Name:  Victoria Walker    MRN: [id number] DOB: 09/22/1950  06/12/2019  [name] was observed post Covid-19 immunization for 15 minutes without incidence. She was provided with Vaccine Information Sheet and instruction to access the V-Safe system.   [name] was instructed to call 911 with any severe reactions post vaccine: Marland Kitchen Difficulty breathing  . Swelling of your face and throat  . A fast heartbeat  . A bad rash all over your body  . Dizziness and weakness    Immunizations Administered    Name Date Dose VIS Date Route   Pfizer COVID-19 Vaccine 06/12/2019  1:21 PM 0.3 mL 04/01/2019 Intramuscular   Manufacturer: [place]   Lot: [id number]   [place]: [id number]

## 2019-06-13 ENCOUNTER — Other Ambulatory Visit: Payer: Self-pay

## 2019-06-13 ENCOUNTER — Encounter: Payer: Self-pay | Admitting: Pediatrics

## 2019-06-13 ENCOUNTER — Ambulatory Visit (INDEPENDENT_AMBULATORY_CARE_PROVIDER_SITE_OTHER): Payer: Medicare Other | Admitting: Pediatrics

## 2019-06-13 ENCOUNTER — Other Ambulatory Visit: Payer: Self-pay | Admitting: Pediatrics

## 2019-06-13 VITALS — BP 106/68 | HR 73 | Temp 98.4°F | Resp 16 | Ht 65.0 in | Wt 175.0 lb

## 2019-06-13 DIAGNOSIS — Z79899 Other long term (current) drug therapy: Secondary | ICD-10-CM

## 2019-06-13 DIAGNOSIS — K219 Gastro-esophageal reflux disease without esophagitis: Secondary | ICD-10-CM | POA: Diagnosis not present

## 2019-06-13 DIAGNOSIS — J3089 Other allergic rhinitis: Secondary | ICD-10-CM

## 2019-06-13 DIAGNOSIS — I10 Essential (primary) hypertension: Secondary | ICD-10-CM

## 2019-06-13 MED ORDER — LEVOCETIRIZINE DIHYDROCHLORIDE 5 MG PO TABS: ORAL_TABLET | ORAL | 3 refills | Status: DC

## 2019-06-13 MED ORDER — MOMETASONE FUROATE 50 MCG/ACT NA SUSP: NASAL | 3 refills | Status: DC

## 2019-06-13 NOTE — Progress Notes (Signed)
[place] [id number] Dept: [phone]  FOLLOW UP NOTE  Patient ID: Victoria Walker, female    DOB: 12-24-1950  Age: 69 y.o. MRN: [id number] Date of Office Visit: 06/13/2019  Assessment  Chief Complaint: Allergies  HPI [name] presents for follow-up of allergic rhinitis.  Her symptoms are controlled by levocetirizine 5 mg once a day.  If needed she has Nasonex 1 spray per nostril twice a day for stuffy nose.  She is not having to use Nasonex on a daily basis.  She does not do nasal saline rinses regularly.  Her blood pressure has been well controlled  Other current medications are outlined in the chart   Drug Allergies:  Allergies  Allergen Reactions  . Codeine Anxiety    Jittery   . Penicillins Itching  . Sulfa Antibiotics Itching  . Vicodin [Hydrocodone-Acetaminophen] Itching    ITCHING     Physical Exam: BP 106/68   Pulse 73   Temp 98.4 F (36.9 C) (Oral)   Resp 16   Ht 5\' 5"  (1.651 m)   Wt 175 lb (79.4 kg)   SpO2 97%   BMI 29.12 kg/m    Physical Exam Vitals reviewed.  Constitutional:      Appearance: Normal appearance. She is normal weight.  HENT:     Head:     Comments: Eyes normal.  Ears normal.  Nose normal.  Pharynx normal. Cardiovascular:     Rate and Rhythm: Normal rate and regular rhythm.     Comments: S1-S2 normal no murmurs Pulmonary:     Comments: Clear to percussion and auscultation Musculoskeletal:     Cervical back: Neck supple.  Lymphadenopathy:     Cervical: No cervical adenopathy.  Neurological:     General: No focal deficit present.     Mental Status: She is alert and oriented to person, place, and time. Mental status is at baseline.  Psychiatric:        Mood and Affect: Mood normal.        Behavior: Behavior normal.        Thought Content: Thought content normal.        Judgment: Judgment normal.     Diagnostics:    Assessment and Plan: 1. Other allergic rhinitis   2. Gastroesophageal  reflux disease without esophagitis   3. Essential hypertension   4. Current use of beta blocker     Meds ordered this encounter  Medications  . levocetirizine (XYZAL) 5 MG tablet    Sig: One tablet once a day as needed for runny nose or itchy eyes    Dispense:  90 tablet    Refill:  3  . mometasone (NASONEX) 50 MCG/ACT nasal spray    Sig: USE ONE SPRAY IN EACH NOSTRIL TWICE DAILY IF NEEDED FOR STUFFY NOSE    Dispense:  51 g    Refill:  3    Patient Instructions  Levocetirizine 5 mg-take 1 tablet once a day for runny nose or itchy eyes Nasonex 1 spray per nostril twice a day if needed for stuffy nose Continue on your other medication  Call us if you are not doing well on this treatment plan   Return in about 1 year (around 06/12/2020).    Thank you for the opportunity to care for this patient.  Please do not hesitate to contact me with questions.  Penne Lash, M.D.  [facility] [place], [place]  [address] [phone]  

## 2019-06-13 NOTE — Patient Instructions (Signed)
Levocetirizine 5 mg-take 1 tablet once a day for runny nose or itchy eyes Nasonex 1 spray per nostril twice a day if needed for stuffy nose Continue on your other medication  Call us if you are not doing well on this treatment plan

## 2019-06-20 MED FILL — ATORVASTATIN 40 MG TABLET: 40 | 90 days supply | Qty: 66 | Fill #1

## 2019-06-20 MED FILL — DILTIAZEM 24HR ER 360 MG CA: 360 | 90 days supply | Qty: 90 | Fill #1

## 2019-06-20 MED FILL — SPIRONOLACTONE 25 MG TABLET: 25 | 90 days supply | Qty: 135 | Fill #1

## 2019-06-20 MED FILL — LEVOTHYROXINE SODIUM 88 MCG: 88 | 90 days supply | Qty: 77 | Fill #1

## 2019-06-20 MED FILL — NexIUM 40 MG CPDR: 40 | 90 days supply | Qty: 90 | Fill #1

## 2019-06-20 MED FILL — AVAPRO 150 MG TABLET: 150 | 90 days supply | Qty: 90 | Fill #1

## 2019-07-04 ENCOUNTER — Ambulatory Visit (INDEPENDENT_AMBULATORY_CARE_PROVIDER_SITE_OTHER): Payer: Medicare Other | Admitting: Obstetrics & Gynecology

## 2019-07-04 ENCOUNTER — Encounter: Payer: Self-pay | Admitting: Obstetrics & Gynecology

## 2019-07-04 ENCOUNTER — Other Ambulatory Visit: Payer: Self-pay

## 2019-07-04 VITALS — BP 108/65 | HR 67 | Ht 65.0 in | Wt 177.0 lb

## 2019-07-04 DIAGNOSIS — Z01419 Encounter for gynecological examination (general) (routine) without abnormal findings: Secondary | ICD-10-CM

## 2019-07-04 DIAGNOSIS — N952 Postmenopausal atrophic vaginitis: Secondary | ICD-10-CM

## 2019-07-04 NOTE — Progress Notes (Signed)
Subjective:     Victoria Walker is a 69 y.o. female here for a routine exam.  Current complaints: no problems noted.   Pts husband is experiencing some early dementia.  She lost some weight due to hyperthyroidism. It is now coming back. She has been followed by her primary care for this.    Gynecologic History No LMP recorded. Patient has had a hysterectomy. Contraception: status post hysterectomy Last Pap: s/p hyst Last mammogram: 10/2018. WNL. Pt is s/p right mastectomy for breast cancer.    Obstetric History OB History  Gravida Para Term Preterm AB Living  3 3 3     3   SAB TAB Ectopic Multiple Live Births               # Outcome Date GA Lbr Len/2nd Weight Sex Delivery Anes PTL Lv  3 Term 14    F Vag-Spont     2 Term 1973    F Vag-Spont     1 Term 43    M Vag-Spont        The following portions of the patient's history were reviewed and updated as appropriate: allergies, current medications, past family history, past medical history, past social history, past surgical history and problem list.  Review of Systems Pertinent items are noted in HPI.    Objective:  BP 108/65   Pulse 67   Ht 5\' 5"  (1.651 m)   Wt 177 lb (80.3 kg)   BMI 29.45 kg/m  General Appearance:    Alert, cooperative, no distress, appears stated age  Head:    Normocephalic, without obvious abnormality, atraumatic  Eyes:    conjunctiva/corneas clear, EOM's intact, both eyes  Ears:    Normal external ear canals, both ears  Nose:   Nares normal, septum midline, mucosa normal, no drainage    or sinus tenderness  Throat:   Lips, mucosa, and tongue normal; teeth and gums normal  Neck:   Supple, symmetrical, trachea midline, no adenopathy;    thyroid:  no enlargement/tenderness/nodules  Back:     Symmetric, no curvature, ROM normal, no CVA tenderness  Lungs:     respirations unlabored  Chest Wall:    No tenderness or deformity   Heart:    Regular rate and rhythm  Breast Exam:    No tenderness, masses,  or nipple abnormality  Abdomen:     Soft, non-tender, bowel sounds active all four quadrants,    no masses, no organomegaly  Genitalia:    Normal female without lesion, discharge or tenderness   Vagina cuff well healed.   Extremities:   Extremities normal, atraumatic, no cyanosis or edema  Pulses:   2+ and symmetric all extremities  Skin:   Skin color, texture, turgor normal, no rashes or lesions     Assessment:    Healthy female exam.   Atrophic vaginitis    Plan:  Keep topical EES  f/u in 1 year Mammogram in 1 year.   Victoria Walker, M.D., Victoria Walker

## 2019-07-05 MED FILL — FLUCONAZOLE 150 MG TABS: 150 | 2 days supply | Qty: 2 | Fill #0

## 2019-07-06 ENCOUNTER — Ambulatory Visit: Payer: Medicare Other | Attending: Internal Medicine

## 2019-07-06 DIAGNOSIS — Z23 Encounter for immunization: Secondary | ICD-10-CM

## 2019-07-06 MED FILL — CLINDAMYCIN HCL 300 MG CAPS: 300 | 4 days supply | Qty: 8 | Fill #0

## 2019-07-06 MED FILL — LOTEPREDNOL ETABONATE 0.5 %: 0.5 | 25 days supply | Qty: 5 | Fill #0

## 2019-07-06 NOTE — Progress Notes (Signed)
   Covid-19 Vaccination Clinic  Name:  SHRITHA HEIDEMAN    MRN: [id number] DOB: Jan 17, 1951  07/06/2019  Ms. Eiben was observed post Covid-19 immunization for 15 minutes without incident. She was provided with Vaccine Information Sheet and instruction to access the V-Safe system.   Ms. Walton was instructed to call 911 with any severe reactions post vaccine: Marland Kitchen Difficulty breathing  . Swelling of face and throat  . A fast heartbeat  . A bad rash all over body  . Dizziness and weakness   Immunizations Administered    Name Date Dose VIS Date Route   Pfizer COVID-19 Vaccine 07/06/2019  3:58 PM 0.3 mL 04/01/2019 Intramuscular   Manufacturer: [place]   Lot: [id number]   [place]: [id number]

## 2019-07-18 ENCOUNTER — Other Ambulatory Visit: Payer: Self-pay

## 2019-07-19 ENCOUNTER — Other Ambulatory Visit (HOSPITAL_COMMUNITY)
Admission: RE | Admit: 2019-07-19 | Discharge: 2019-07-19 | Disposition: A | Discharge status: Home / Self Care | Payer: Medicare Other | Source: Ambulatory Visit | Attending: Family Medicine | Admitting: Family Medicine

## 2019-07-19 ENCOUNTER — Other Ambulatory Visit: Payer: Self-pay

## 2019-07-19 ENCOUNTER — Ambulatory Visit (INDEPENDENT_AMBULATORY_CARE_PROVIDER_SITE_OTHER): Payer: Medicare Other | Admitting: Family Medicine

## 2019-07-19 VITALS — BP 105/61 | HR 60 | Temp 97.5°F | Resp 12 | Ht 65.0 in | Wt 173.4 lb

## 2019-07-19 DIAGNOSIS — E1140 Type 2 diabetes mellitus with diabetic neuropathy, unspecified: Secondary | ICD-10-CM | POA: Diagnosis not present

## 2019-07-19 DIAGNOSIS — N760 Acute vaginitis: Secondary | ICD-10-CM

## 2019-07-19 DIAGNOSIS — E039 Hypothyroidism, unspecified: Secondary | ICD-10-CM

## 2019-07-19 DIAGNOSIS — I10 Essential (primary) hypertension: Secondary | ICD-10-CM | POA: Diagnosis not present

## 2019-07-19 DIAGNOSIS — R350 Frequency of micturition: Secondary | ICD-10-CM | POA: Diagnosis not present

## 2019-07-19 DIAGNOSIS — E782 Mixed hyperlipidemia: Secondary | ICD-10-CM

## 2019-07-19 DIAGNOSIS — R300 Dysuria: Secondary | ICD-10-CM | POA: Diagnosis not present

## 2019-07-19 LAB — COMPREHENSIVE METABOLIC PANEL
ALT: 18 U/L (ref 0–35)
AST: 16 U/L (ref 0–37)
Albumin: 4.5 g/dL (ref 3.5–5.2)
Alkaline Phosphatase: 61 U/L (ref 39–117)
BUN: 15 mg/dL (ref 6–23)
CO2: 31 mEq/L (ref 19–32)
Calcium: 9.5 mg/dL (ref 8.4–10.5)
Chloride: 101 mEq/L (ref 96–112)
Creatinine, Ser: 0.92 mg/dL (ref 0.40–1.20)
GFR: 73.1 mL/min (ref >60.00)
Glucose, Bld: 96 mg/dL (ref 70–99)
Potassium: 4.8 mEq/L (ref 3.5–5.1)
Sodium: 138 mEq/L (ref 135–145)
Total Bilirubin: 0.5 mg/dL (ref 0.2–1.2)
Total Protein: 6.8 g/dL (ref 6.0–8.3)

## 2019-07-19 LAB — POC URINALSYSI DIPSTICK (AUTOMATED)
Bilirubin, UA: NEGATIVE
Blood, UA: NEGATIVE
Glucose, UA: NEGATIVE
Ketones, UA: NEGATIVE
Leukocytes, UA: NEGATIVE
Nitrite, UA: NEGATIVE
Protein, UA: NEGATIVE
Spec Grav, UA: 1.01 (ref 1.010–1.025)
Urobilinogen, UA: 0.2 E.U./dL
pH, UA: 6 (ref 5.0–8.0)

## 2019-07-19 LAB — CBC
HCT: 39.3 % (ref 36.0–46.0)
Hemoglobin: 13.2 g/dL (ref 12.0–15.0)
MCHC: 33.7 g/dL (ref 30.0–36.0)
MCV: 94.8 fl (ref 78.0–100.0)
Platelets: 261 10*3/uL (ref 150.0–400.0)
RBC: 4.15 Mil/uL (ref 3.87–5.11)
RDW: 13 % (ref 11.5–15.5)
WBC: 6.5 10*3/uL (ref 4.0–10.5)

## 2019-07-19 LAB — LIPID PANEL
Cholesterol: 168 mg/dL (ref 0–200)
HDL: 41.1 mg/dL (ref >39.00)
LDL Cholesterol: 101 mg/dL — ABNORMAL HIGH (ref 0–99)
NonHDL: 126.86
Total CHOL/HDL Ratio: 4
Triglycerides: 130 mg/dL (ref 0.0–149.0)
VLDL: 26 mg/dL (ref 0.0–40.0)

## 2019-07-19 LAB — TSH: TSH: 0.82 u[IU]/mL (ref 0.35–4.50)

## 2019-07-19 LAB — HEMOGLOBIN A1C: Hgb A1c MFr Bld: 6.5 % (ref 4.6–6.5)

## 2019-07-19 NOTE — Assessment & Plan Note (Signed)
On Levothyroxine, continue to monitor 

## 2019-07-19 NOTE — Progress Notes (Signed)
Subjective:    Patient ID: Victoria Walker, female    DOB: 01/15/1951, 69 y.o.   MRN: [id number]  Chief Complaint  Patient presents with  . 4 MONTHS FOLLOW  . Urinary Frequency  . Dysuria    HPI Patient is in today for follow up on chronic medical concerns. She got both the Pfizer vaccines and tolerated tem well. Did have HA and myalgias for a day and then felt. She is noting some vaginal itching for about a week. She thinks it was started by eating some increased sugars. Notes urinary frequency as well. No hematuria. No recent febrile illness or hospitalizations. hgba1c acceptable, minimize simple carbs. Increase exercise as tolerated. Continue current meds. She does note occasional LLQ pain but it is much less frequent and short lived. The gabapentin helps her peripheral neuropathy which is mild and has been stable  Past Medical History:  Diagnosis Date  . Arthritis    "both knees" (07/02/2015)  . Cancer of right breast ([place] [place])   . Claustrophobia   . Dysrhythmia    HX OF FAST HEART RATE AND PALPITATIONS - METOPROLOL HAS HELPED  . GERD (gastroesophageal reflux disease)   . H/O hiatal hernia   . H/O iritis    LEFT EYE - STATES HER EYE BECOMES RED AND VERY SENSITIVE TO LIGHT WHEN FLARE UP OF IRITIS  . Heart murmur    "benign; 06/2015"  . High cholesterol   . History of blood transfusion    "when I had my partial hysterectomy"  . History of chickenpox 09/26/2014  . History of measles   . History of MRSA infection   . History of shingles   . Hypertension   . Hypothyroidism   . Medicare annual wellness visit, initial 04/07/2015  . Multinodular thyroid    PT HAVING TROUBLE SWALLOWING  . Neuropathy    FEET  . Primary localized osteoarthritis of right knee   . Sciatica of right side 04/25/2015  . Spinal headache    spinal headache with epidural  . Total knee replacement status, right   . Type II diabetes mellitus (HCC)    ORAL MEDICATION - NO INSULIN  . Wears glasses      Past Surgical History:  Procedure Laterality Date  . ABDOMINAL HYSTERECTOMY  1988  . ABDOMINOPLASTY  1986  . ANTERIOR AND POSTERIOR REPAIR  05/11/2012   Procedure: ANTERIOR (CYSTOCELE) AND POSTERIOR REPAIR (RECTOCELE);  Surgeon: Reece Packer, MD;  Location: [facility];  Service: Urology;;  with graft  . BREAST BIOPSY Right   . CARDIAC CATHETERIZATION  1999  . COLONOSCOPY    . CYSTOSCOPY WITH URETHRAL DILATATION  05/11/2012   Procedure: CYSTOSCOPY WITH URETHRAL DILATATION;  Surgeon: Reece Packer, MD;  Location: [facility];  Service: Urology;;  . ESOPHAGOGASTRODUODENOSCOPY    . HERNIA REPAIR    . MASTECTOMY Right 2007  . PLANTAR FASCIA SURGERY Left   . THYROIDECTOMY N/A 02/23/2014   Procedure: TOTAL THYROIDECTOMY;  Surgeon: Armandina Gemma, MD;  Location: [facility];  Service: General;  Laterality: N/A;  . TOTAL KNEE ARTHROPLASTY Right 07/02/2015  . TOTAL KNEE ARTHROPLASTY Right 07/02/2015   Procedure: TOTAL KNEE ARTHROPLASTY;  Surgeon: Elsie Saas, MD;  Location: [facility];  Service: Orthopedics;  Laterality: Right;  . TOTAL THYROIDECTOMY  1976   removed three tumor (2 on right; 1 on left)  . TUBAL LIGATION    . UMBILICAL HERNIA REPAIR  1986  . VAGINAL PROLAPSE REPAIR  05/11/2012   Procedure: VAGINAL  VAULT SUSPENSION;  Surgeon: Reece Packer, MD;  Location: [facility];  Service: Urology;;  . VULVA / PERINEUM BIOPSY     vulvar    Family History  Problem Relation Age of Onset  . Hypertension Mother   . Heart disease Mother        pacer, CHF  . Hyperlipidemia Mother   . Kidney disease Mother        1 kidney due to stone  . Kidney Stones Mother   . Hypertension Father   . Cancer Father        lung, psa  . Diabetes Father   . Alcohol abuse Father   . Diabetes Sister   . Cancer Sister   . Hypertension Sister   . Diabetes Brother   . Hypertension Brother   . Hyperlipidemia Brother   . Kidney disease Brother        dialysis  . Stroke Brother   . Hypertension Maternal  Grandmother   . Heart disease Maternal Grandmother   . Stroke Maternal Grandmother   . Hypertension Maternal Grandfather   . Hypertension Daughter   . Arthritis Son   . Hyperlipidemia Sister   . Hypertension Sister   . Diabetes Sister   . Thyroid disease Sister   . Kidney disease Sister   . Cancer Sister        thyroid and breast  . Endometriosis Sister   . Arthritis Sister        back pain  . Hypertension Sister   . Thyroid disease Sister   . Hypertension Brother   . Hyperlipidemia Brother   . Hypertension Brother   . Hyperlipidemia Brother   . Benign prostatic hyperplasia Brother   . Hypertension Daughter   . Hyperlipidemia Daughter   . Diabetes Daughter   . Hypertension Maternal Aunt   . Hypertension Maternal Uncle   . Hypertension Paternal Aunt   . Cancer Paternal Aunt   . Hypertension Paternal Uncle     Social History   Socioeconomic History  . Marital status: Married    Spouse name: Not on file  . Number of children: 3  . Years of education: Not on file  . Highest education level: Not on file  Occupational History    Comment: Retired  Tobacco Use  . Smoking status: Never Smoker  . Smokeless tobacco: Never Used  Substance and Sexual Activity  . Alcohol use: No    Alcohol/week: 0.0 standard drinks  . Drug use: No  . Sexual activity: Not Currently    Birth control/protection: Surgical    Comment: lives with husband, retired from various jobs, diabetic diet  Other Topics Concern  . Not on file  Social History Narrative  . Not on file   Social Determinants of Health   Financial Resource Strain:   . Difficulty of Paying Living Expenses:   Food Insecurity:   . Worried About Charity fundraiser in the Last Year:   . Arboriculturist in the Last Year:   Transportation Needs:   . Film/video editor (Medical):   Marland Kitchen Lack of Transportation (Non-Medical):   Physical Activity:   . Days of Exercise per Week:   . Minutes of Exercise per Session:   Stress:    . Feeling of Stress :   Social Connections:   . Frequency of Communication with Friends and Family:   . Frequency of Social Gatherings with Friends and Family:   . Attends Religious Services:   .  Active Member of Clubs or Organizations:   . Attends Archivist Meetings:   Marland Kitchen Marital Status:   Intimate Partner Violence:   . Fear of Current or Ex-Partner:   . Emotionally Abused:   Marland Kitchen Physically Abused:   . Sexually Abused:     Outpatient Medications Prior to Visit  Medication Sig Dispense Refill  . aspirin 81 MG EC tablet Take 81 mg by mouth every other day. Swallow whole.    Marland Kitchen atorvastatin (LIPITOR) 40 MG tablet TAKE 1 TABLET BY MOUTH EVERY OTHER DAY, ALTERNATING WITH 1/2 TABLET 66 tablet 3  . AVAPRO 150 MG tablet TAKE 1/2 TO 1 (ONE-HALF TO ONE) TABLET BY MOUTH ONCE DAILY 90 tablet 1  . azelastine (ASTELIN) 0.1 % nasal spray Place 2 sprays into both nostrils 2 (two) times daily. Use in each nostril as directed 30 mL 12  . calcium-vitamin D (OSCAL WITH D) 250-125 MG-UNIT per tablet Take 1 tablet by mouth daily.     . Cholecalciferol (VITAMIN D) 2000 UNITS CAPS Take 1 capsule by mouth every evening.     . Ciclopirox 1 % shampoo Apply 1 each topically every Friday.     . cycloSPORINE (RESTASIS) 0.05 % ophthalmic emulsion Place 1 drop into both eyes 2 (two) times daily.    Marland Kitchen diltiazem (TIAZAC) 360 MG 24 hr capsule TAKE 1 CAPSULE (360 MG TOTAL) BY MOUTH EVERY MORNING. 90 capsule 3  . DOCOSAHEXAENOIC ACID PO Take by mouth.    . Estradiol (YUVAFEM) 10 MCG TABS vaginal tablet INSERT 1 TAB VAGINALLY AT BEDTIME EVERY OTHER DAY 16 tablet 12  . fluconazole (DIFLUCAN) 100 MG tablet Take 1 tablet (100 mg total) by mouth daily. 2 tablet 2  . gabapentin (NEURONTIN) 300 MG capsule Take 1 capsule (300 mg total) by mouth at bedtime. 90 capsule 2  . glucose blood (ONE TOUCH TEST STRIPS) test strip Use as directed twice daily to check blood sugar. DX code E11.9 300 each 1  . levocetirizine (XYZAL)  5 MG tablet One tablet once a day as needed for runny nose or itchy eyes 90 tablet 3  . levothyroxine (SYNTHROID) 88 MCG tablet 1 tab po daily except skip Sunday 90 tablet 3  . loteprednol (LOTEMAX) 0.5 % ophthalmic suspension Place 1 drop into the left eye daily as needed ("arthiritis of the eye").     . metFORMIN (GLUCOPHAGE-XR) 750 MG 24 hr tablet TAKE 1 TABLET IN AM AND 1 TABLET AT LUNCH 180 tablet 1  . metoprolol succinate (TOPROL-XL) 25 MG 24 hr tablet TAKE 1 TABLET BY MOUTH TWICE A DAY AT 12 NOON AND 4PM 180 tablet 1  . mometasone (NASONEX) 50 MCG/ACT nasal spray USE ONE SPRAY IN EACH NOSTRIL TWICE DAILY IF NEEDED FOR STUFFY NOSE 51 g 3  . NEXIUM 40 MG capsule Take 1 capsule by mouth in the morning 90 capsule 1  . polyvinyl alcohol-povidone (REFRESH) 1.4-0.6 % ophthalmic solution Place 1 drop into both eyes 2 (two) times daily.    . Probiotic Product (PROBIOTIC DAILY PO) Take 1 capsule by mouth daily.    Marland Kitchen spironolactone (ALDACTONE) 25 MG tablet TAEK 1 & 1/2 TAB BY MOUTH EVERY DAY 135 tablet 1  . clindamycin (CLEOCIN) 300 MG capsule Take 300 mg by mouth daily as needed (one hour before dental work).      No facility-administered medications prior to visit.    Allergies  Allergen Reactions  . Codeine Anxiety    Jittery   . Penicillins  Itching  . Sulfa Antibiotics Itching  . Vicodin [Hydrocodone-Acetaminophen] Itching    ITCHING     Review of Systems  Constitutional: Negative for fever and malaise/fatigue.  HENT: Negative for congestion.   Eyes: Negative for blurred vision.  Respiratory: Negative for shortness of breath.   Cardiovascular: Negative for chest pain, palpitations and leg swelling.  Gastrointestinal: Positive for abdominal pain. Negative for blood in stool and nausea.  Genitourinary: Positive for dysuria and frequency.  Musculoskeletal: Negative for falls.  Skin: Positive for itching. Negative for rash.  Neurological: Negative for dizziness, loss of consciousness  and headaches.  Endo/Heme/Allergies: Negative for environmental allergies.  Psychiatric/Behavioral: Negative for depression. The patient is not nervous/anxious.        Objective:    Physical Exam Vitals and nursing note reviewed.  Constitutional:      General: She is not in acute distress.    Appearance: She is well-developed.  HENT:     Head: Normocephalic and atraumatic.     Nose: Nose normal.  Eyes:     General:        Right eye: No discharge.        Left eye: No discharge.  Cardiovascular:     Rate and Rhythm: Normal rate and regular rhythm.     Heart sounds: No murmur.  Pulmonary:     Effort: Pulmonary effort is normal.     Breath sounds: Normal breath sounds.  Abdominal:     General: Bowel sounds are normal.     Palpations: Abdomen is soft.     Tenderness: There is no abdominal tenderness.  Musculoskeletal:     Cervical back: Normal range of motion and neck supple.  Skin:    General: Skin is warm and dry.  Neurological:     Mental Status: She is alert and oriented to person, place, and time.     BP 105/61 (BP Location: Left Arm, Cuff Size: Normal)   Pulse 60   Temp (!) 97.5 F (36.4 C) (Temporal)   Resp 12   Ht 5\' 5"  (1.651 m)   Wt 173 lb 6.4 oz (78.7 kg)   SpO2 100%   BMI 28.86 kg/m  Wt Readings from Last 3 Encounters:  07/19/19 173 lb 6.4 oz (78.7 kg)  07/04/19 177 lb (80.3 kg)  06/13/19 175 lb (79.4 kg)    Diabetic Foot Exam - Simple   No data filed     Lab Results  Component Value Date   WBC 6.7 03/21/2019   HGB 13.0 03/21/2019   HCT 40.1 03/21/2019   PLT 282.0 03/21/2019   GLUCOSE 100 (H) 03/21/2019   CHOL 191 03/21/2019   TRIG 168.0 (H) 03/21/2019   HDL 43.50 03/21/2019   LDLDIRECT 107.0 07/01/2016   LDLCALC 113 (H) 03/21/2019   ALT 19 03/21/2019   AST 17 03/21/2019   NA 140 03/21/2019   K 4.3 03/21/2019   CL 102 03/21/2019   CREATININE 0.87 03/21/2019   BUN 12 03/21/2019   CO2 30 03/21/2019   TSH 0.55 03/21/2019   INR 1.01  06/21/2015   HGBA1C 6.6 (H) 03/21/2019   MICROALBUR <0.7 08/02/2018    Lab Results  Component Value Date   TSH 0.55 03/21/2019   Lab Results  Component Value Date   WBC 6.7 03/21/2019   HGB 13.0 03/21/2019   HCT 40.1 03/21/2019   MCV 94.1 03/21/2019   PLT 282.0 03/21/2019   Lab Results  Component Value Date   NA 140 03/21/2019  K 4.3 03/21/2019   CO2 30 03/21/2019   GLUCOSE 100 (H) 03/21/2019   BUN 12 03/21/2019   CREATININE 0.87 03/21/2019   BILITOT 0.4 03/21/2019   ALKPHOS 60 03/21/2019   AST 17 03/21/2019   ALT 19 03/21/2019   PROT 7.0 03/21/2019   ALBUMIN 4.3 03/21/2019   CALCIUM 9.7 03/21/2019   ANIONGAP 9 07/04/2015   GFR 78.04 03/21/2019   Lab Results  Component Value Date   CHOL 191 03/21/2019   Lab Results  Component Value Date   HDL 43.50 03/21/2019   Lab Results  Component Value Date   LDLCALC 113 (H) 03/21/2019   Lab Results  Component Value Date   TRIG 168.0 (H) 03/21/2019   Lab Results  Component Value Date   CHOLHDL 4 03/21/2019   Lab Results  Component Value Date   HGBA1C 6.6 (H) 03/21/2019       Assessment & Plan:   Problem List Items Addressed This Visit    Type 2 diabetes mellitus with diabetic neuropathy, without long-term current use of insulin (HCC)    hgba1c acceptable, minimize simple carbs. Increase exercise as tolerated. Continue current meds      Relevant Orders   Hemoglobin A1c   Essential hypertension    Well controlled, no changes to meds. Encouraged heart healthy diet such as the DASH diet and exercise as tolerated.       Relevant Orders   CBC   Comprehensive metabolic panel   TSH   Hyperlipidemia, mixed    Encouraged heart healthy diet, increase exercise, avoid trans fats, consider a krill oil cap daily      Relevant Orders   Lipid panel   Hypothyroid    On Levothyroxine, continue to monitor      Vaginitis and vulvovaginitis    Patient complains of itching consider candidiasis. Check urine  ancillary testing.       Relevant Orders   Urine cytology ancillary only([place])   Urinary frequency - Primary    Check urinalysis.       Relevant Orders   POCT Urinalysis Dipstick (Automated) (Completed)   Urine Culture   Urine cytology ancillary only([place])   Dysuria   Relevant Orders   Urine Culture      I have discontinued Anacristina I. Pflaum's clindamycin. I am also having her maintain her calcium-vitamin D, loteprednol, Ciclopirox, cycloSPORINE, polyvinyl alcohol-povidone, Vitamin D, Probiotic Product (PROBIOTIC DAILY PO), aspirin, DOCOSAHEXAENOIC ACID PO, glucose blood, fluconazole, Estradiol, gabapentin, Avapro, atorvastatin, azelastine, spironolactone, NexIUM, diltiazem, levothyroxine, metoprolol succinate, metFORMIN, levocetirizine, and mometasone.  No orders of the defined types were placed in this encounter.    Penni Homans, MD

## 2019-07-19 NOTE — Assessment & Plan Note (Signed)
Check urinalysis. 

## 2019-07-19 NOTE — Assessment & Plan Note (Signed)
Encouraged heart healthy diet, increase exercise, avoid trans fats, consider a krill oil cap daily 

## 2019-07-19 NOTE — Assessment & Plan Note (Signed)
hgba1c acceptable, minimize simple carbs. Increase exercise as tolerated. Continue current meds 

## 2019-07-19 NOTE — Assessment & Plan Note (Signed)
Patient complains of itching consider candidiasis. Check urine ancillary testing.

## 2019-07-19 NOTE — Assessment & Plan Note (Signed)
Well controlled, no changes to meds. Encouraged heart healthy diet such as the DASH diet and exercise as tolerated.  °

## 2019-07-19 NOTE — Patient Instructions (Addendum)

## 2019-07-20 ENCOUNTER — Encounter: Payer: Self-pay | Admitting: *Deleted

## 2019-07-20 LAB — URINE CULTURE
MICRO NUMBER:: 10307417
Result:: NO GROWTH
SPECIMEN QUALITY:: ADEQUATE

## 2019-07-21 MED FILL — METOPROLOL SUCCINATE ER 25: 25 | 90 days supply | Qty: 180 | Fill #1

## 2019-07-21 MED FILL — GABAPENTIN 300 MG CAPSULE: 300 | 90 days supply | Qty: 90 | Fill #0

## 2019-07-21 MED FILL — LEVOCETIRIZINE 5 MG TABLET: 5 | 90 days supply | Qty: 90 | Fill #0

## 2019-07-21 MED FILL — METFORMIN HCL ER 750 MG TB2: 750 | 90 days supply | Qty: 180 | Fill #1

## 2019-07-22 LAB — URINE CYTOLOGY ANCILLARY ONLY
Bacterial Vaginitis-Urine: NEGATIVE
Candida Urine: NEGATIVE

## 2019-07-26 DIAGNOSIS — E1140 Type 2 diabetes mellitus with diabetic neuropathy, unspecified: Secondary | ICD-10-CM | POA: Diagnosis not present

## 2019-08-09 DIAGNOSIS — Z96651 Presence of right artificial knee joint: Secondary | ICD-10-CM | POA: Diagnosis not present

## 2019-08-09 DIAGNOSIS — M1712 Unilateral primary osteoarthritis, left knee: Secondary | ICD-10-CM | POA: Diagnosis not present

## 2019-08-17 ENCOUNTER — Other Ambulatory Visit: Payer: Self-pay | Admitting: Family Medicine

## 2019-08-17 ENCOUNTER — Encounter: Payer: Self-pay | Admitting: Family Medicine

## 2019-08-17 MED ORDER — METFORMIN HCL 500 MG PO TABS: 500.0000 mg | ORAL_TABLET | Freq: Three times a day (TID) | ORAL | 1 refills | Status: DC

## 2019-08-17 MED FILL — METFORMIN HCL 500 MG TABS: 500 | 90 days supply | Qty: 270 | Fill #0

## 2019-08-17 MED FILL — RESTASIS 0.05% EYE EMULSION: 0.05 | 90 days supply | Qty: 180 | Fill #2

## 2019-08-17 MED FILL — NASONEX 50 MCG NASAL SPRAY: 50 | 90 days supply | Qty: 51 | Fill #0

## 2019-08-18 ENCOUNTER — Telehealth: Payer: Self-pay | Admitting: Family Medicine

## 2019-08-18 DIAGNOSIS — Z1211 Encounter for screening for malignant neoplasm of colon: Secondary | ICD-10-CM

## 2019-08-18 NOTE — Telephone Encounter (Signed)
Document put at front office tray under providers name.  ?

## 2019-08-18 NOTE — Telephone Encounter (Signed)
Pt dropped off document (letter) for provider to see and have on pt's chart. [facility]- letter stating pt is due for colonoscopy) Pt wants to talk with provider on her day of appt 11-17-2019 about having colonoscopy done at another office. Pt just wanted provider to see letter.

## 2019-08-19 NOTE — Telephone Encounter (Signed)
Advised patient that paperwork was received and that we will discuss at her next visit in July.

## 2019-08-22 ENCOUNTER — Encounter: Payer: Self-pay | Admitting: Family Medicine

## 2019-08-23 ENCOUNTER — Encounter: Payer: Self-pay | Admitting: Family Medicine

## 2019-08-23 NOTE — Telephone Encounter (Signed)
Per Dr. Charlett Blake ok to place referral.  Patient ok with referral.  Referral placed.

## 2019-08-24 ENCOUNTER — Other Ambulatory Visit: Payer: Self-pay | Admitting: Family Medicine

## 2019-08-24 MED ORDER — METFORMIN HCL ER 500 MG PO TB24: 500.0000 mg | ORAL_TABLET | Freq: Three times a day (TID) | ORAL | 3 refills | Status: DC

## 2019-08-24 MED FILL — metFORMIN HCL ER 500 MG TB2: 500 | 30 days supply | Qty: 90 | Fill #0

## 2019-08-24 NOTE — Telephone Encounter (Signed)
Patient said that will be great

## 2019-08-30 DIAGNOSIS — C50911 Malignant neoplasm of unspecified site of right female breast: Secondary | ICD-10-CM | POA: Diagnosis not present

## 2019-09-03 IMAGING — CR DG HIP (WITH OR WITHOUT PELVIS) 2-3V*R*
3 series · 3 of 3 positions shown · non-contrast
Comparison: None.

CLINICAL DATA: Right hip pain.

EXAM:
DG HIP (WITH OR WITHOUT PELVIS) 2-3V RIGHT

[t pelvis a.p.]
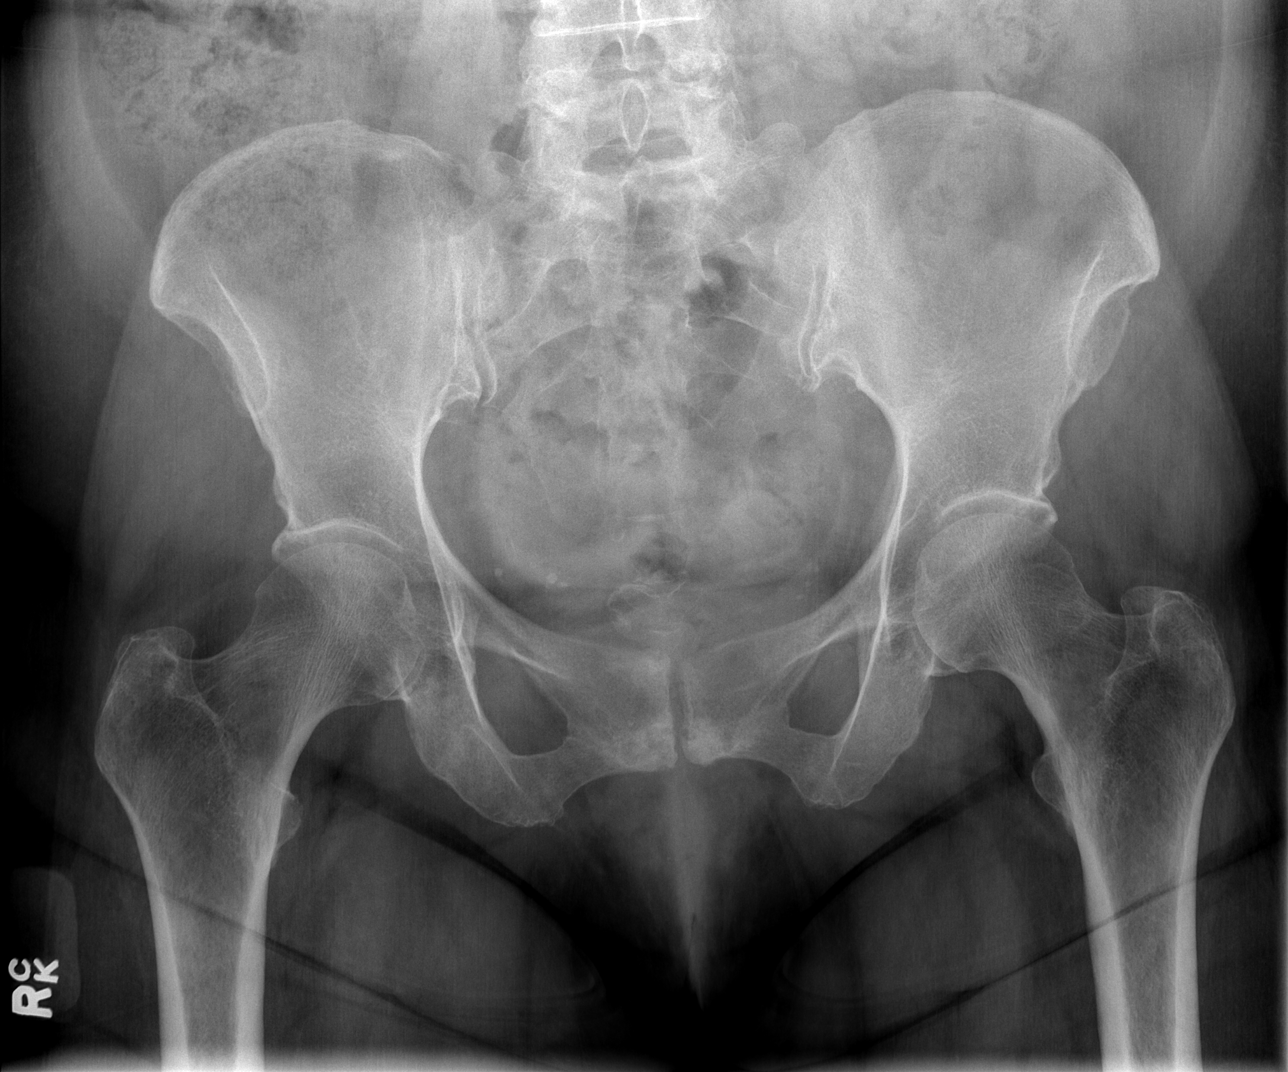

[t hip ap right]
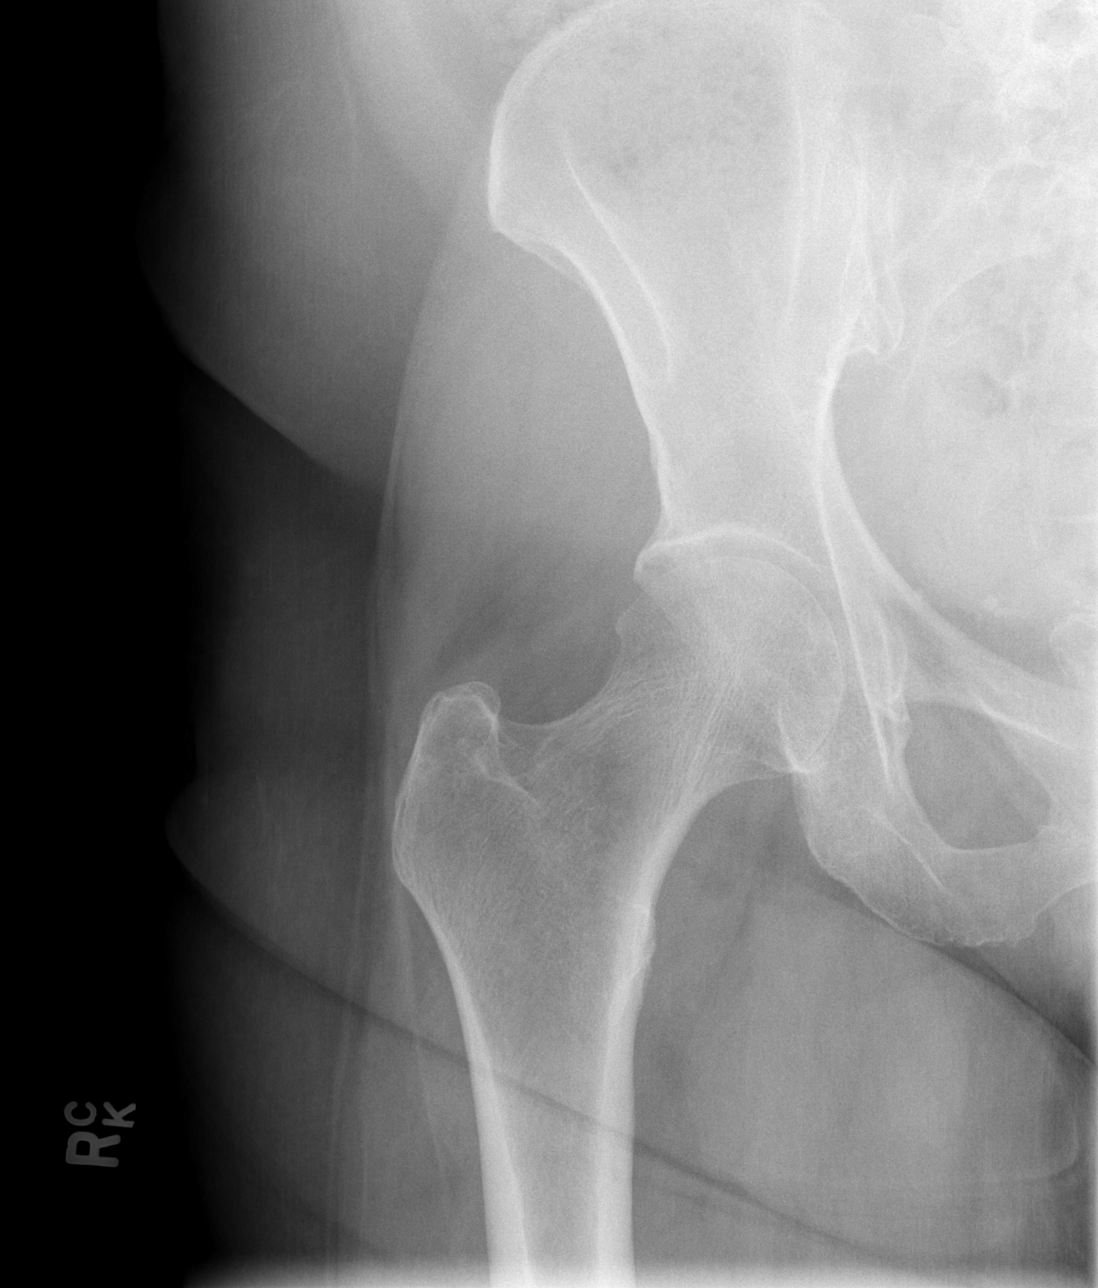

[t hip frog leg right]
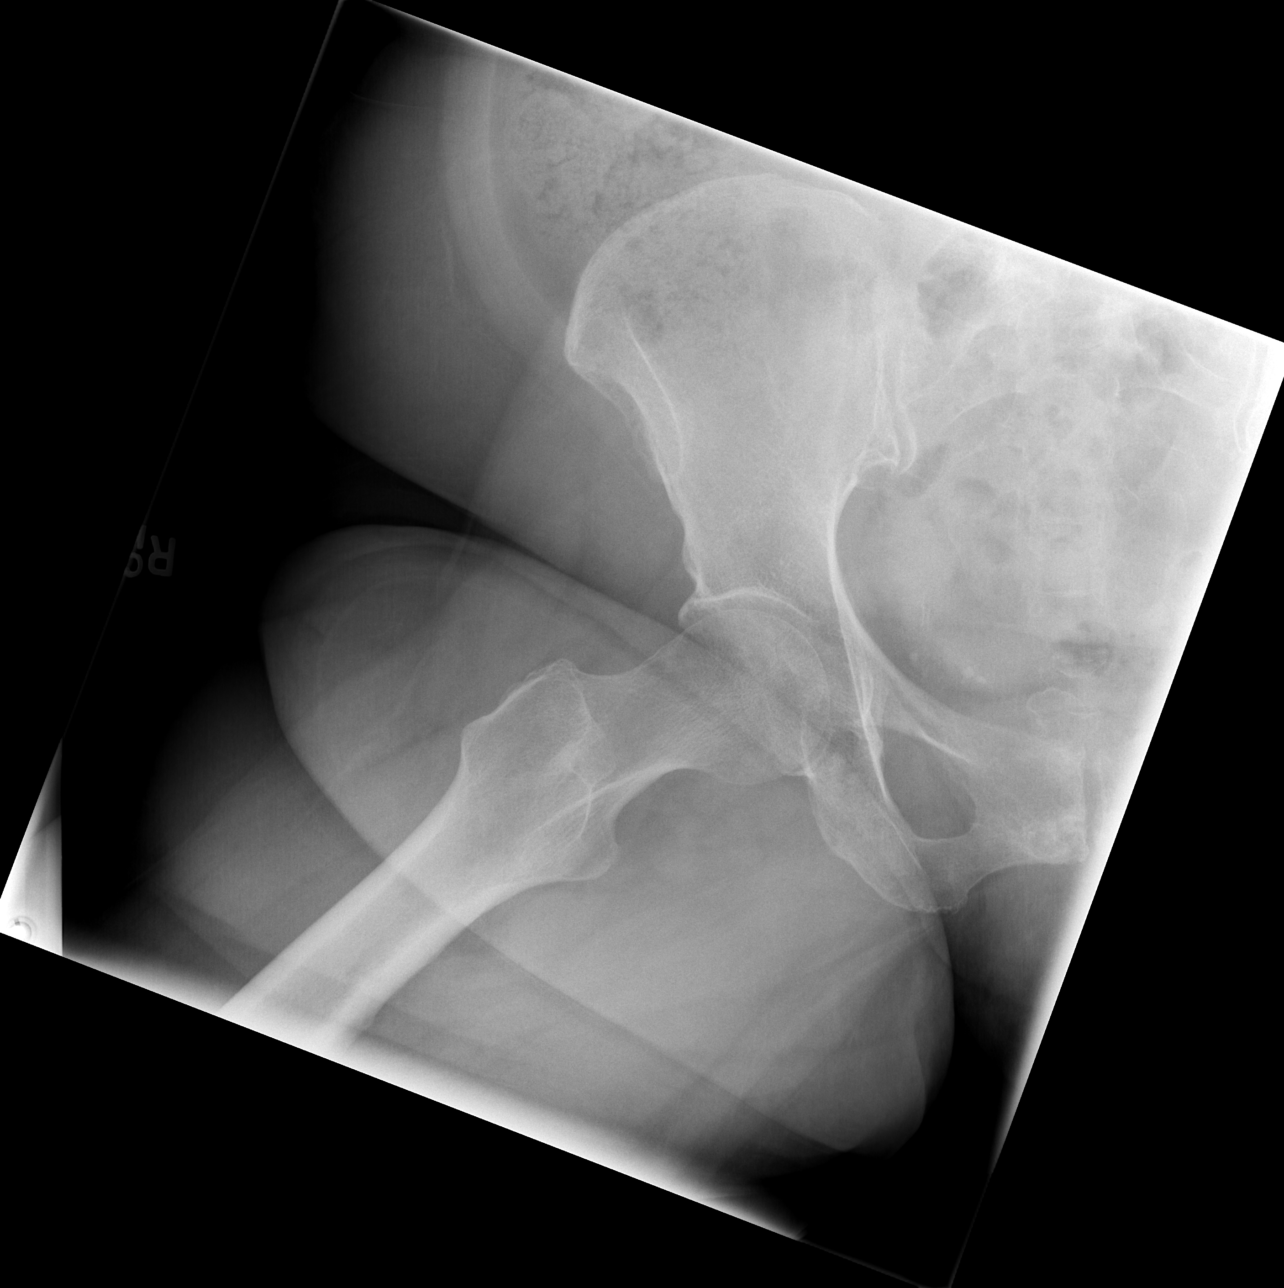

[3 of 3 positions shown; findings below may reference images not displayed]

FINDINGS: There is no evidence of hip fracture or dislocation. There is no
evidence of arthropathy or other focal bone abnormality.
IMPRESSION: Normal right hip.

## 2019-09-14 ENCOUNTER — Other Ambulatory Visit: Payer: Self-pay | Admitting: Family Medicine

## 2019-09-14 DIAGNOSIS — I10 Essential (primary) hypertension: Secondary | ICD-10-CM

## 2019-09-14 MED FILL — SPIRONOLACTONE 25 MG TABS: 25 | 90 days supply | Qty: 135 | Fill #0

## 2019-09-14 MED FILL — DILTIAZEM 24HR ER 360 MG CA: 360 | 90 days supply | Qty: 90 | Fill #2

## 2019-09-14 MED FILL — LEVOTHYROXINE SODIUM 88 MCG: 88 | 90 days supply | Qty: 77 | Fill #2

## 2019-09-14 MED FILL — AVAPRO 150 MG TABLET: 150 | 90 days supply | Qty: 90 | Fill #0

## 2019-09-14 MED FILL — NexIUM 40 MG CPDR: 40 | 90 days supply | Qty: 90 | Fill #0

## 2019-09-14 MED FILL — ATORVASTATIN CALCIUM 40 MG: 40 | 90 days supply | Qty: 66 | Fill #2

## 2019-10-04 DIAGNOSIS — C50911 Malignant neoplasm of unspecified site of right female breast: Secondary | ICD-10-CM | POA: Diagnosis not present

## 2019-10-05 ENCOUNTER — Telehealth: Payer: Self-pay | Admitting: Gastroenterology

## 2019-10-05 NOTE — Telephone Encounter (Signed)
Hey Dr. Rush Landmark, this patient is being referred to [place] for a colonoscopy. Patient had last colonoscopy in 2016 by Dr. Collene Mares. You are the DOD from 5/4 pm and I am going to send the records up to you for review. Please advise on scheduling. Thank you!

## 2019-10-05 NOTE — Telephone Encounter (Signed)
I will review the records when I return to the office on 6/17. Thank you. GM

## 2019-10-06 NOTE — Telephone Encounter (Signed)
Patient colonoscopy in 2016 showed 2 5 mm sessile polyps in the rectosigmoid and a 6 mm sessile polyp in the proximal transverse colon.  These were all removed via cold snare.  The pathology returned showing evidence of tubular adenoma and hyperplastic polyps.  Based on previous guidelines it was recommended for 5-year follow-up.  We will plan to proceed with a 5-year follow-up and then follow new guidelines based on the findings at this colonoscopy.  Okay to proceed with scheduling procedure.  Justice Britain, MD [facility] Office # [phone]

## 2019-10-10 ENCOUNTER — Encounter: Payer: Self-pay | Admitting: Gastroenterology

## 2019-10-10 NOTE — Telephone Encounter (Signed)
Patient is scheduled 8/26 to discuss procedures.

## 2019-10-17 DIAGNOSIS — H40023 Open angle with borderline findings, high risk, bilateral: Secondary | ICD-10-CM | POA: Diagnosis not present

## 2019-10-17 LAB — HM DIABETES EYE EXAM

## 2019-10-20 ENCOUNTER — Other Ambulatory Visit: Payer: Self-pay | Admitting: Family Medicine

## 2019-10-20 MED FILL — LEVOCETIRIZINE 5 MG TABLET: 5 | 90 days supply | Qty: 90 | Fill #1

## 2019-10-20 MED FILL — GABAPENTIN 300 MG CAPSULE: 300 | 90 days supply | Qty: 90 | Fill #1

## 2019-10-20 MED FILL — metFORMIN HCL ER 500 MG TB2: 500 | 30 days supply | Qty: 90 | Fill #1

## 2019-10-20 MED FILL — METOPROLOL SUCCINATE ER 25: 25 | 90 days supply | Qty: 180 | Fill #0

## 2019-10-24 MED FILL — LOTEMAX 0.5% EYE DROPS: 0.5 | 25 days supply | Qty: 5 | Fill #1

## 2019-11-01 DIAGNOSIS — E1140 Type 2 diabetes mellitus with diabetic neuropathy, unspecified: Secondary | ICD-10-CM | POA: Diagnosis not present

## 2019-11-16 MED FILL — RESTASIS 0.05% EYE EMULSION: 0.05 | 90 days supply | Qty: 180 | Fill #3

## 2019-11-16 MED FILL — MOMETASONE FUROATE 50 MCG S: 50 | 90 days supply | Qty: 51 | Fill #1

## 2019-11-17 ENCOUNTER — Other Ambulatory Visit: Payer: Self-pay | Admitting: Family Medicine

## 2019-11-17 ENCOUNTER — Ambulatory Visit (INDEPENDENT_AMBULATORY_CARE_PROVIDER_SITE_OTHER): Payer: Medicare Other | Admitting: Family Medicine

## 2019-11-17 ENCOUNTER — Other Ambulatory Visit: Payer: Self-pay

## 2019-11-17 ENCOUNTER — Encounter: Payer: Self-pay | Admitting: Family Medicine

## 2019-11-17 VITALS — BP 112/70 | HR 58 | Temp 98.0°F | Resp 12 | Ht 65.0 in | Wt 170.8 lb

## 2019-11-17 DIAGNOSIS — E039 Hypothyroidism, unspecified: Secondary | ICD-10-CM | POA: Diagnosis not present

## 2019-11-17 DIAGNOSIS — E1140 Type 2 diabetes mellitus with diabetic neuropathy, unspecified: Secondary | ICD-10-CM | POA: Diagnosis not present

## 2019-11-17 DIAGNOSIS — M79672 Pain in left foot: Secondary | ICD-10-CM

## 2019-11-17 DIAGNOSIS — I10 Essential (primary) hypertension: Secondary | ICD-10-CM

## 2019-11-17 DIAGNOSIS — Z1239 Encounter for other screening for malignant neoplasm of breast: Secondary | ICD-10-CM | POA: Diagnosis not present

## 2019-11-17 DIAGNOSIS — G6289 Other specified polyneuropathies: Secondary | ICD-10-CM

## 2019-11-17 DIAGNOSIS — M79671 Pain in right foot: Secondary | ICD-10-CM

## 2019-11-17 DIAGNOSIS — E782 Mixed hyperlipidemia: Secondary | ICD-10-CM | POA: Diagnosis not present

## 2019-11-17 LAB — CBC
HCT: 39 % (ref 36.0–46.0)
Hemoglobin: 13.3 g/dL (ref 12.0–15.0)
MCHC: 34 g/dL (ref 30.0–36.0)
MCV: 97.3 fl (ref 78.0–100.0)
Platelets: 242 10*3/uL (ref 150.0–400.0)
RBC: 4.01 Mil/uL (ref 3.87–5.11)
RDW: 12.7 % (ref 11.5–15.5)
WBC: 6.3 10*3/uL (ref 4.0–10.5)

## 2019-11-17 LAB — COMPREHENSIVE METABOLIC PANEL
ALT: 19 U/L (ref 0–35)
AST: 18 U/L (ref 0–37)
Albumin: 4.4 g/dL (ref 3.5–5.2)
Alkaline Phosphatase: 52 U/L (ref 39–117)
BUN: 9 mg/dL (ref 6–23)
CO2: 31 mEq/L (ref 19–32)
Calcium: 9.5 mg/dL (ref 8.4–10.5)
Chloride: 104 mEq/L (ref 96–112)
Creatinine, Ser: 0.92 mg/dL (ref 0.40–1.20)
GFR: 73.03 mL/min (ref >60.00)
Glucose, Bld: 108 mg/dL — ABNORMAL HIGH (ref 70–99)
Potassium: 4.7 mEq/L (ref 3.5–5.1)
Sodium: 140 mEq/L (ref 135–145)
Total Bilirubin: 0.5 mg/dL (ref 0.2–1.2)
Total Protein: 6.8 g/dL (ref 6.0–8.3)

## 2019-11-17 LAB — LIPID PANEL
Cholesterol: 151 mg/dL (ref 0–200)
HDL: 42.9 mg/dL (ref >39.00)
LDL Cholesterol: 81 mg/dL (ref 0–99)
NonHDL: 108.02
Total CHOL/HDL Ratio: 4
Triglycerides: 137 mg/dL (ref 0.0–149.0)
VLDL: 27.4 mg/dL (ref 0.0–40.0)

## 2019-11-17 LAB — HEMOGLOBIN A1C: Hgb A1c MFr Bld: 6.5 % (ref 4.6–6.5)

## 2019-11-17 LAB — TSH: TSH: 0.75 u[IU]/mL (ref 0.35–4.50)

## 2019-11-17 MED ORDER — METFORMIN HCL ER 750 MG PO TB24: 750.0000 mg | ORAL_TABLET | Freq: Two times a day (BID) | ORAL | 1 refills | Status: DC

## 2019-11-17 MED FILL — METFORMIN HCL ER 750 MG TB2: 750 | 90 days supply | Qty: 180 | Fill #0

## 2019-11-17 NOTE — Assessment & Plan Note (Signed)
On Levothyroxine, continue to monitor 

## 2019-11-17 NOTE — Patient Instructions (Signed)
[place] [phone] call for Mammogram  Carbohydrate Counting for Diabetes Mellitus, Adult  Carbohydrate counting is a method of keeping track of how many carbohydrates you eat. Eating carbohydrates naturally increases the amount of sugar (glucose) in the blood. Counting how many carbohydrates you eat helps keep your blood glucose within normal limits, which helps you manage your diabetes (diabetes mellitus). It is important to know how many carbohydrates you can safely have in each meal. This is different for every person. A diet and nutrition specialist (registered dietitian) can help you make a meal plan and calculate how many carbohydrates you should have at each meal and snack. Carbohydrates are found in the following foods:  Grains, such as breads and cereals.  Dried beans and soy products.  Starchy vegetables, such as potatoes, peas, and corn.  Fruit and fruit juices.  Milk and yogurt.  Sweets and snack foods, such as cake, cookies, candy, chips, and soft drinks. How do I count carbohydrates? There are two ways to count carbohydrates in food. You can use either of the methods or a combination of both. Reading "Nutrition Facts" on packaged food The "Nutrition Facts" list is included on the labels of almost all packaged foods and beverages in the [place] It includes:  The serving size.  Information about nutrients in each serving, including the grams (g) of carbohydrate per serving. To use the "Nutrition Facts":  Decide how many servings you will have.  Multiply the number of servings by the number of carbohydrates per serving.  The resulting number is the total amount of carbohydrates that you will be having. Learning standard serving sizes of other foods When you eat carbohydrate foods that are not packaged or do not include "Nutrition Facts" on the label, you need to measure the servings in order to count the amount of carbohydrates:  Measure the  foods that you will eat with a food scale or measuring cup, if needed.  Decide how many standard-size servings you will eat.  Multiply the number of servings by 15. Most carbohydrate-rich foods have about 15 g of carbohydrates per serving. ? For example, if you eat 8 oz (170 g) of strawberries, you will have eaten 2 servings and 30 g of carbohydrates (2 servings x 15 g = 30 g).  For foods that have more than one food mixed, such as soups and casseroles, you must count the carbohydrates in each food that is included. The following list contains standard serving sizes of common carbohydrate-rich foods. Each of these servings has about 15 g of carbohydrates:   hamburger bun or  English muffin.   oz (15 mL) syrup.   oz (14 g) jelly.  1 slice of bread.  1 six-inch tortilla.  3 oz (85 g) cooked rice or pasta.  4 oz (113 g) cooked dried beans.  4 oz (113 g) starchy vegetable, such as peas, corn, or potatoes.  4 oz (113 g) hot cereal.  4 oz (113 g) mashed potatoes or  of a large baked potato.  4 oz (113 g) canned or frozen fruit.  4 oz (120 mL) fruit juice.  4-6 crackers.  6 chicken nuggets.  6 oz (170 g) unsweetened dry cereal.  6 oz (170 g) plain fat-free yogurt or yogurt sweetened with artificial sweeteners.  8 oz (240 mL) milk.  8 oz (170 g) fresh fruit or one small piece of fruit.  24 oz (680 g) popped popcorn. Example of carbohydrate counting Sample meal  3 oz (  85 g) chicken breast.  6 oz (170 g) brown rice.  4 oz (113 g) corn.  8 oz (240 mL) milk.  8 oz (170 g) strawberries with sugar-free whipped topping. Carbohydrate calculation 1. Identify the foods that contain carbohydrates: ? Rice. ? Corn. ? Milk. ? Strawberries. 2. Calculate how many servings you have of each food: ? 2 servings rice. ? 1 serving corn. ? 1 serving milk. ? 1 serving strawberries. 3. Multiply each number of servings by 15 g: ? 2 servings rice x 15 g = 30 g. ? 1 serving  corn x 15 g = 15 g. ? 1 serving milk x 15 g = 15 g. ? 1 serving strawberries x 15 g = 15 g. 4. Add together all of the amounts to find the total grams of carbohydrates eaten: ? 30 g + 15 g + 15 g + 15 g = 75 g of carbohydrates total. Summary  Carbohydrate counting is a method of keeping track of how many carbohydrates you eat.  Eating carbohydrates naturally increases the amount of sugar (glucose) in the blood.  Counting how many carbohydrates you eat helps keep your blood glucose within normal limits, which helps you manage your diabetes.  A diet and nutrition specialist (registered dietitian) can help you make a meal plan and calculate how many carbohydrates you should have at each meal and snack. This information is not intended to replace advice given to you by your health care provider. Make sure you discuss any questions you have with your health care provider. Document Revised: 10/30/2016 Document Reviewed: 09/19/2015 Elsevier Patient Education  [place].

## 2019-11-17 NOTE — Progress Notes (Signed)
Subjective:    Patient ID: Victoria Walker, female    DOB: Feb 13, 1951, 69 y.o.   MRN: [id number]  Chief Complaint  Patient presents with  . Follow-up  . Referral    to foot doctor  . mammogram    would like to change from [facility] to here    HPI Patient is in today for follow up chronic medical concerns. No recent febrile illness or recent hospitalizations. No polyuria or polydipsia. She had some headaches and myalgias with COVID shots but only lasted a day or two. She is trying to stay active and eat well. Denies CP/palp/SOB/HA/congestion/fevers/GI or GU c/o. Taking meds as prescribed. She struggles with pain in her feet daily left>right. She has some numbness and pain in her feet which bothers her daily.  Past Medical History:  Diagnosis Date  . Arthritis    "both knees" (07/02/2015)  . Cancer of right breast ([place])   . Claustrophobia   . Dysrhythmia    HX OF FAST HEART RATE AND PALPITATIONS - METOPROLOL HAS HELPED  . GERD (gastroesophageal reflux disease)   . H/O hiatal hernia   . H/O iritis    LEFT EYE - STATES HER EYE BECOMES RED AND VERY SENSITIVE TO LIGHT WHEN FLARE UP OF IRITIS  . Heart murmur    "benign; 06/2015"  . High cholesterol   . History of blood transfusion    "when I had my partial hysterectomy"  . History of chickenpox 09/26/2014  . History of measles   . History of MRSA infection   . History of shingles   . Hypertension   . Hypothyroidism   . Medicare annual wellness visit, initial 04/07/2015  . Multinodular thyroid    PT HAVING TROUBLE SWALLOWING  . Neuropathy    FEET  . Primary localized osteoarthritis of right knee   . Sciatica of right side 04/25/2015  . Spinal headache    spinal headache with epidural  . Total knee replacement status, right   . Type II diabetes mellitus (HCC)    ORAL MEDICATION - NO INSULIN  . Wears glasses     Past Surgical History:  Procedure Laterality Date  . ABDOMINAL HYSTERECTOMY  1988  . ABDOMINOPLASTY  1986  .  ANTERIOR AND POSTERIOR REPAIR  05/11/2012   Procedure: ANTERIOR (CYSTOCELE) AND POSTERIOR REPAIR (RECTOCELE);  Surgeon: Reece Packer, MD;  Location: [facility];  Service: Urology;;  with graft  . BREAST BIOPSY Right   . CARDIAC CATHETERIZATION  1999  . COLONOSCOPY    . CYSTOSCOPY WITH URETHRAL DILATATION  05/11/2012   Procedure: CYSTOSCOPY WITH URETHRAL DILATATION;  Surgeon: Reece Packer, MD;  Location: [facility];  Service: Urology;;  . ESOPHAGOGASTRODUODENOSCOPY    . HERNIA REPAIR    . MASTECTOMY Right 2007  . PLANTAR FASCIA SURGERY Left   . THYROIDECTOMY N/A 02/23/2014   Procedure: TOTAL THYROIDECTOMY;  Surgeon: Armandina Gemma, MD;  Location: [facility];  Service: General;  Laterality: N/A;  . TOTAL KNEE ARTHROPLASTY Right 07/02/2015  . TOTAL KNEE ARTHROPLASTY Right 07/02/2015   Procedure: TOTAL KNEE ARTHROPLASTY;  Surgeon: Elsie Saas, MD;  Location: [facility];  Service: Orthopedics;  Laterality: Right;  . TOTAL THYROIDECTOMY  1976   removed three tumor (2 on right; 1 on left)  . TUBAL LIGATION    . UMBILICAL HERNIA REPAIR  1986  . VAGINAL PROLAPSE REPAIR  05/11/2012   Procedure: VAGINAL VAULT SUSPENSION;  Surgeon: Reece Packer, MD;  Location: [facility];  Service: Urology;;  . VULVA / PERINEUM BIOPSY     vulvar    Family History  Problem Relation Age of Onset  . Hypertension Mother   . Heart disease Mother        pacer, CHF  . Hyperlipidemia Mother   . Kidney disease Mother        1 kidney due to stone  . Kidney Stones Mother   . Hypertension Father   . Cancer Father        lung, psa  . Diabetes Father   . Alcohol abuse Father   . Diabetes Sister   . Cancer Sister   . Hypertension Sister   . Diabetes Brother   . Hypertension Brother   . Hyperlipidemia Brother   . Kidney disease Brother        dialysis  . Stroke Brother   . Hypertension Maternal Grandmother   . Heart disease Maternal Grandmother   . Stroke Maternal Grandmother   . Hypertension Maternal Grandfather   .  Hypertension Daughter   . Arthritis Son   . Hyperlipidemia Sister   . Hypertension Sister   . Diabetes Sister   . Thyroid disease Sister   . Kidney disease Sister   . Cancer Sister        thyroid and breast  . Endometriosis Sister   . Arthritis Sister        back pain  . Hypertension Sister   . Thyroid disease Sister   . Hypertension Brother   . Hyperlipidemia Brother   . Hypertension Brother   . Hyperlipidemia Brother   . Benign prostatic hyperplasia Brother   . Hypertension Daughter   . Hyperlipidemia Daughter   . Diabetes Daughter   . Hypertension Maternal Aunt   . Hypertension Maternal Uncle   . Hypertension Paternal Aunt   . Cancer Paternal Aunt   . Hypertension Paternal Uncle     Social History   Socioeconomic History  . Marital status: Married    Spouse name: Not on file  . Number of children: 3  . Years of education: Not on file  . Highest education level: Not on file  Occupational History    Comment: Retired  Tobacco Use  . Smoking status: Never Smoker  . Smokeless tobacco: Never Used  Vaping Use  . Vaping Use: Never used  Substance and Sexual Activity  . Alcohol use: No    Alcohol/week: 0.0 standard drinks  . Drug use: No  . Sexual activity: Not Currently    Birth control/protection: Surgical    Comment: lives with husband, retired from various jobs, diabetic diet  Other Topics Concern  . Not on file  Social History Narrative  . Not on file   Social Determinants of Health   Financial Resource Strain:   . Difficulty of Paying Living Expenses:   Food Insecurity:   . Worried About Charity fundraiser in the Last Year:   . Arboriculturist in the Last Year:   Transportation Needs:   . Film/video editor (Medical):   Marland Kitchen Lack of Transportation (Non-Medical):   Physical Activity:   . Days of Exercise per Week:   . Minutes of Exercise per Session:   Stress:   . Feeling of Stress :   Social Connections:   . Frequency of Communication with  Friends and Family:   . Frequency of Social Gatherings with Friends and Family:   . Attends Religious Services:   . Active Member of  Clubs or Organizations:   . Attends Archivist Meetings:   Marland Kitchen Marital Status:   Intimate Partner Violence:   . Fear of Current or Ex-Partner:   . Emotionally Abused:   Marland Kitchen Physically Abused:   . Sexually Abused:     Outpatient Medications Prior to Visit  Medication Sig Dispense Refill  . aspirin 81 MG EC tablet Take 81 mg by mouth every other day. Swallow whole.    Marland Kitchen atorvastatin (LIPITOR) 40 MG tablet TAKE 1 TABLET BY MOUTH EVERY OTHER DAY, ALTERNATING WITH 1/2 TABLET 66 tablet 3  . AVAPRO 150 MG tablet TAKE 1/2 TO 1 TABLET BY MOUTH DAILY 90 tablet 1  . calcium-vitamin D (OSCAL WITH D) 250-125 MG-UNIT per tablet Take 1 tablet by mouth daily.     . Cholecalciferol (VITAMIN D) 2000 UNITS CAPS Take 1 capsule by mouth every evening.     . Ciclopirox 1 % shampoo Apply 1 each topically every Friday.     . cycloSPORINE (RESTASIS) 0.05 % ophthalmic emulsion Place 1 drop into both eyes 2 (two) times daily.    Marland Kitchen diltiazem (TIAZAC) 360 MG 24 hr capsule TAKE 1 CAPSULE (360 MG TOTAL) BY MOUTH EVERY MORNING. 90 capsule 3  . DOCOSAHEXAENOIC ACID PO Take by mouth.    . Estradiol (YUVAFEM) 10 MCG TABS vaginal tablet INSERT 1 TAB VAGINALLY AT BEDTIME EVERY OTHER DAY 16 tablet 12  . gabapentin (NEURONTIN) 300 MG capsule Take 1 capsule (300 mg total) by mouth at bedtime. 90 capsule 2  . glucose blood (ONE TOUCH TEST STRIPS) test strip Use as directed twice daily to check blood sugar. DX code E11.9 300 each 1  . levocetirizine (XYZAL) 5 MG tablet One tablet once a day as needed for runny nose or itchy eyes 90 tablet 3  . levothyroxine (SYNTHROID) 88 MCG tablet 1 tab po daily except skip Sunday 90 tablet 3  . loteprednol (LOTEMAX) 0.5 % ophthalmic suspension Place 1 drop into the left eye daily as needed ("arthiritis of the eye").     . metoprolol succinate (TOPROL-XL)  25 MG 24 hr tablet TAKE 1 TABLET BY MOUTH TWICE A DAY AT 12 NOON AND 4PM 180 tablet 1  . mometasone (NASONEX) 50 MCG/ACT nasal spray USE ONE SPRAY IN EACH NOSTRIL TWICE DAILY IF NEEDED FOR STUFFY NOSE 51 g 3  . NEXIUM 40 MG capsule TAKE 1 CAPSULE BY MOUTH IN THE MORNING 90 capsule 1  . polyvinyl alcohol-povidone (REFRESH) 1.4-0.6 % ophthalmic solution Place 1 drop into both eyes 2 (two) times daily.    . Probiotic Product (PROBIOTIC DAILY PO) Take 1 capsule by mouth daily.    Marland Kitchen spironolactone (ALDACTONE) 25 MG tablet TAEK 1 & 1/2 TAB BY MOUTH EVERY DAY 135 tablet 1  . metFORMIN (GLUCOPHAGE-XR) 500 MG 24 hr tablet Take 1 tablet (500 mg total) by mouth 3 (three) times daily. 90 tablet 3  . azelastine (ASTELIN) 0.1 % nasal spray Place 2 sprays into both nostrils 2 (two) times daily. Use in each nostril as directed 30 mL 12  . fluconazole (DIFLUCAN) 100 MG tablet Take 1 tablet (100 mg total) by mouth daily. 2 tablet 2   No facility-administered medications prior to visit.    Allergies  Allergen Reactions  . Codeine Anxiety    Jittery   . Penicillins Itching  . Sulfa Antibiotics Itching  . Vicodin [Hydrocodone-Acetaminophen] Itching    ITCHING     Review of Systems  Constitutional: Negative for fever and  malaise/fatigue.  HENT: Negative for congestion.   Eyes: Negative for blurred vision.  Respiratory: Negative for shortness of breath.   Cardiovascular: Negative for chest pain, palpitations and leg swelling.  Gastrointestinal: Negative for abdominal pain, blood in stool and nausea.  Genitourinary: Negative for dysuria and frequency.  Musculoskeletal: Positive for joint pain. Negative for falls.  Skin: Negative for rash.  Neurological: Positive for sensory change. Negative for dizziness, loss of consciousness and headaches.  Endo/Heme/Allergies: Negative for environmental allergies.  Psychiatric/Behavioral: Negative for depression. The patient is not nervous/anxious.          Objective:    Physical Exam Vitals and nursing note reviewed.  Constitutional:      General: She is not in acute distress.    Appearance: She is well-developed.  HENT:     Head: Normocephalic and atraumatic.     Nose: Nose normal.  Eyes:     General:        Right eye: No discharge.        Left eye: No discharge.  Cardiovascular:     Rate and Rhythm: Normal rate and regular rhythm.     Heart sounds: No murmur heard.   Pulmonary:     Effort: Pulmonary effort is normal.     Breath sounds: Normal breath sounds.  Abdominal:     General: Bowel sounds are normal.     Palpations: Abdomen is soft.     Tenderness: There is no abdominal tenderness.  Musculoskeletal:     Cervical back: Normal range of motion and neck supple.  Skin:    General: Skin is warm and dry.  Neurological:     Mental Status: She is alert and oriented to person, place, and time.     BP 112/70 (BP Location: Left Arm, Cuff Size: Large)   Pulse 58   Temp 98 F (36.7 C) (Oral)   Resp 12   Ht 5\' 5"  (1.651 m)   Wt 170 lb 12.8 oz (77.5 kg)   SpO2 98%   BMI 28.42 kg/m  Wt Readings from Last 3 Encounters:  11/17/19 170 lb 12.8 oz (77.5 kg)  07/20/19 173 lb (78.5 kg)  07/19/19 173 lb 6.4 oz (78.7 kg)    Diabetic Foot Exam - Simple   No data filed     Lab Results  Component Value Date   WBC 6.3 11/17/2019   HGB 13.3 11/17/2019   HCT 39.0 11/17/2019   PLT 242.0 11/17/2019   GLUCOSE 108 (H) 11/17/2019   CHOL 151 11/17/2019   TRIG 137.0 11/17/2019   HDL 42.90 11/17/2019   LDLDIRECT 107.0 07/01/2016   LDLCALC 81 11/17/2019   ALT 19 11/17/2019   AST 18 11/17/2019   NA 140 11/17/2019   K 4.7 11/17/2019   CL 104 11/17/2019   CREATININE 0.92 11/17/2019   BUN 9 11/17/2019   CO2 31 11/17/2019   TSH 0.75 11/17/2019   INR 1.01 06/21/2015   HGBA1C 6.5 11/17/2019   MICROALBUR <0.7 08/02/2018    Lab Results  Component Value Date   TSH 0.75 11/17/2019   Lab Results  Component Value Date   WBC  6.3 11/17/2019   HGB 13.3 11/17/2019   HCT 39.0 11/17/2019   MCV 97.3 11/17/2019   PLT 242.0 11/17/2019   Lab Results  Component Value Date   NA 140 11/17/2019   K 4.7 11/17/2019   CO2 31 11/17/2019   GLUCOSE 108 (H) 11/17/2019   BUN 9 11/17/2019   CREATININE 0.92  11/17/2019   BILITOT 0.5 11/17/2019   ALKPHOS 52 11/17/2019   AST 18 11/17/2019   ALT 19 11/17/2019   PROT 6.8 11/17/2019   ALBUMIN 4.4 11/17/2019   CALCIUM 9.5 11/17/2019   ANIONGAP 9 07/04/2015   GFR 73.03 11/17/2019   Lab Results  Component Value Date   CHOL 151 11/17/2019   Lab Results  Component Value Date   HDL 42.90 11/17/2019   Lab Results  Component Value Date   LDLCALC 81 11/17/2019   Lab Results  Component Value Date   TRIG 137.0 11/17/2019   Lab Results  Component Value Date   CHOLHDL 4 11/17/2019   Lab Results  Component Value Date   HGBA1C 6.5 11/17/2019       Assessment & Plan:   Problem List Items Addressed This Visit    Type 2 diabetes mellitus with diabetic neuropathy, without long-term current use of insulin (HCC)    hgba1c acceptable, minimize simple carbs. Increase exercise as tolerated. Continue current meds      Relevant Medications   metFORMIN (GLUCOPHAGE XR) 750 MG 24 hr tablet   Other Relevant Orders   Hemoglobin A1c (Completed)   Ambulatory referral to Podiatry   Essential hypertension    Well controlled, no changes to meds. Encouraged heart healthy diet such as the DASH diet and exercise as tolerated.       Relevant Orders   CBC (Completed)   Comprehensive metabolic panel (Completed)   TSH (Completed)   Hyperlipidemia, mixed    Encouraged heart healthy diet, increase exercise, avoid trans fats, consider a krill oil cap daily.       Relevant Orders   Lipid panel (Completed)   Hypothyroid    On Levothyroxine, continue to monitor      Relevant Orders   TSH (Completed)   Peripheral neuropathy   Relevant Orders   Ambulatory referral to Podiatry    Foot pain, bilateral    Referred to podiatry for further evaluation      Relevant Orders   Ambulatory referral to Podiatry   Breast cancer screening, high risk patient - Primary    Mammogram is ordered.       Relevant Orders   MM DIAG BREAST TOMO BILATERAL      I have discontinued Joycelyn Schmid I. [place]'s fluconazole, azelastine, and metFORMIN. I am also having her start on metFORMIN. Additionally, I am having her maintain her calcium-vitamin D, loteprednol, Ciclopirox, cycloSPORINE, polyvinyl alcohol-povidone, Vitamin D, Probiotic Product (PROBIOTIC DAILY PO), aspirin, DOCOSAHEXAENOIC ACID PO, glucose blood, Estradiol, gabapentin, atorvastatin, diltiazem, levothyroxine, levocetirizine, mometasone, NexIUM, Avapro, spironolactone, and metoprolol succinate.  Meds ordered this encounter  Medications  . metFORMIN (GLUCOPHAGE XR) 750 MG 24 hr tablet    Sig: Take 1 tablet (750 mg total) by mouth in the morning and at bedtime.    Dispense:  180 tablet    Refill:  1     Penni Homans, MD

## 2019-11-17 NOTE — Assessment & Plan Note (Signed)
Well controlled, no changes to meds. Encouraged heart healthy diet such as the DASH diet and exercise as tolerated.  °

## 2019-11-17 NOTE — Assessment & Plan Note (Signed)
hgba1c acceptable, minimize simple carbs. Increase exercise as tolerated. Continue current meds 

## 2019-11-17 NOTE — Assessment & Plan Note (Signed)
Encouraged heart healthy diet, increase exercise, avoid trans fats, consider a krill oil cap daily 

## 2019-11-18 DIAGNOSIS — M79671 Pain in right foot: Secondary | ICD-10-CM | POA: Insufficient documentation

## 2019-11-18 DIAGNOSIS — Z1239 Encounter for other screening for malignant neoplasm of breast: Secondary | ICD-10-CM | POA: Insufficient documentation

## 2019-11-18 DIAGNOSIS — M79672 Pain in left foot: Secondary | ICD-10-CM | POA: Insufficient documentation

## 2019-11-18 NOTE — Assessment & Plan Note (Signed)
Referred to podiatry for further evaluation

## 2019-11-18 NOTE — Assessment & Plan Note (Signed)
Mammogram is ordered

## 2019-11-22 ENCOUNTER — Other Ambulatory Visit: Payer: Self-pay | Admitting: Podiatry

## 2019-11-22 ENCOUNTER — Encounter: Payer: Self-pay | Admitting: Podiatry

## 2019-11-22 ENCOUNTER — Ambulatory Visit (INDEPENDENT_AMBULATORY_CARE_PROVIDER_SITE_OTHER): Payer: Medicare Other | Admitting: Podiatry

## 2019-11-22 ENCOUNTER — Other Ambulatory Visit: Payer: Self-pay

## 2019-11-22 ENCOUNTER — Ambulatory Visit (INDEPENDENT_AMBULATORY_CARE_PROVIDER_SITE_OTHER): Payer: Medicare Other

## 2019-11-22 VITALS — BP 138/79 | HR 70 | Temp 97.8°F | Resp 16

## 2019-11-22 DIAGNOSIS — M2041 Other hammer toe(s) (acquired), right foot: Secondary | ICD-10-CM

## 2019-11-22 DIAGNOSIS — E1140 Type 2 diabetes mellitus with diabetic neuropathy, unspecified: Secondary | ICD-10-CM | POA: Diagnosis not present

## 2019-11-22 DIAGNOSIS — M79672 Pain in left foot: Secondary | ICD-10-CM

## 2019-11-22 DIAGNOSIS — M722 Plantar fascial fibromatosis: Secondary | ICD-10-CM

## 2019-11-22 DIAGNOSIS — M2042 Other hammer toe(s) (acquired), left foot: Secondary | ICD-10-CM | POA: Diagnosis not present

## 2019-11-22 DIAGNOSIS — M79671 Pain in right foot: Secondary | ICD-10-CM | POA: Diagnosis not present

## 2019-11-22 NOTE — Patient Instructions (Addendum)
Diabetes Mellitus and Foot Care Foot care is an important part of your health, especially when you have diabetes. Diabetes may cause you to have problems because of poor blood flow (circulation) to your feet and legs, which can cause your skin to:  Become thinner and drier.  Break more easily.  Heal more slowly.  Peel and crack. You may also have nerve damage (neuropathy) in your legs and feet, causing decreased feeling in them. This means that you may not notice minor injuries to your feet that could lead to more serious problems. Noticing and addressing any potential problems early is the best way to prevent future foot problems. How to care for your feet Foot hygiene  Wash your feet daily with warm water and mild soap. Do not use hot water. Then, pat your feet and the areas between your toes until they are completely dry. Do not soak your feet as this can dry your skin.  Trim your toenails straight across. Do not dig under them or around the cuticle. File the edges of your nails with an emery board or nail file.  Apply a moisturizing lotion or petroleum jelly to the skin on your feet and to dry, brittle toenails. Use lotion that does not contain alcohol and is unscented. Do not apply lotion between your toes. Shoes and socks  Wear clean socks or stockings every day. Make sure they are not too tight. Do not wear knee-high stockings since they may decrease blood flow to your legs.  Wear shoes that fit properly and have enough cushioning. Always look in your shoes before you put them on to be sure there are no objects inside.  To break in new shoes, wear them for just a few hours a day. This prevents injuries on your feet. Wounds, scrapes, corns, and calluses  Check your feet daily for blisters, cuts, bruises, sores, and redness. If you cannot see the bottom of your feet, use a mirror or ask someone for help.  Do not cut corns or calluses or try to remove them with medicine.  If you  find a minor scrape, cut, or break in the skin on your feet, keep it and the skin around it clean and dry. You may clean these areas with mild soap and water. Do not clean the area with peroxide, alcohol, or iodine.  If you have a wound, scrape, corn, or callus on your foot, look at it several times a day to make sure it is healing and not infected. Check for: ? Redness, swelling, or pain. ? Fluid or blood. ? Warmth. ? Pus or a bad smell. General instructions  Do not cross your legs. This may decrease blood flow to your feet.  Do not use heating pads or hot water bottles on your feet. They may burn your skin. If you have lost feeling in your feet or legs, you may not know this is happening until it is too late.  Protect your feet from hot and cold by wearing shoes, such as at the beach or on hot pavement.  Schedule a complete foot exam at least once a year (annually) or more often if you have foot problems. If you have foot problems, report any cuts, sores, or bruises to your health care provider immediately. Contact a health care provider if:  You have a medical condition that increases your risk of infection and you have any cuts, sores, or bruises on your feet.  You have an injury that is not   healing.  You have redness on your legs or feet.  You feel burning or tingling in your legs or feet.  You have pain or cramps in your legs and feet.  Your legs or feet are numb.  Your feet always feel cold.  You have pain around a toenail. Get help right away if:  You have a wound, scrape, corn, or callus on your foot and: ? You have pain, swelling, or redness that gets worse. ? You have fluid or blood coming from the wound, scrape, corn, or callus. ? Your wound, scrape, corn, or callus feels warm to the touch. ? You have pus or a bad smell coming from the wound, scrape, corn, or callus. ? You have a fever. ? You have a red line going up your leg. Summary  Check your feet every day  for cuts, sores, red spots, swelling, and blisters.  Moisturize feet and legs daily.  Wear shoes that fit properly and have enough cushioning.  If you have foot problems, report any cuts, sores, or bruises to your health care provider immediately.  Schedule a complete foot exam at least once a year (annually) or more often if you have foot problems. This information is not intended to replace advice given to you by your health care provider. Make sure you discuss any questions you have with your health care provider. Document Revised: 12/29/2018 Document Reviewed: 05/09/2016 Elsevier Patient Education  [place].  Diabetes Mellitus and [place] care is an important part of your health, especially when you have diabetes. Diabetes may cause you to have problems because of poor blood flow (circulation) to your feet and legs, which can cause your skin to:  Become thinner and drier.  Break more easily.  Heal more slowly.  Peel and crack. You may also have nerve damage (neuropathy) in your legs and feet, causing decreased feeling in them. This means that you may not notice minor injuries to your feet that could lead to more serious problems. Noticing and addressing any potential problems early is the best way to prevent future foot problems. How to care for your feet Foot hygiene  Wash your feet daily with warm water and mild soap. Do not use hot water. Then, pat your feet and the areas between your toes until they are completely dry. Do not soak your feet as this can dry your skin.  Trim your toenails straight across. Do not dig under them or around the cuticle. File the edges of your nails with an emery board or nail file.  Apply a moisturizing lotion or petroleum jelly to the skin on your feet and to dry, brittle toenails. Use lotion that does not contain alcohol and is unscented. Do not apply lotion between your toes. Shoes and socks  Wear clean socks or stockings every  day. Make sure they are not too tight. Do not wear knee-high stockings since they may decrease blood flow to your legs.  Wear shoes that fit properly and have enough cushioning. Always look in your shoes before you put them on to be sure there are no objects inside.  To break in new shoes, wear them for just a few hours a day. This prevents injuries on your feet. Wounds, scrapes, corns, and calluses  Check your feet daily for blisters, cuts, bruises, sores, and redness. If you cannot see the bottom of your feet, use a mirror or ask someone for help.  Do not cut corns or calluses or  try to remove them with medicine.  If you find a minor scrape, cut, or break in the skin on your feet, keep it and the skin around it clean and dry. You may clean these areas with mild soap and water. Do not clean the area with peroxide, alcohol, or iodine.  If you have a wound, scrape, corn, or callus on your foot, look at it several times a day to make sure it is healing and not infected. Check for: ? Redness, swelling, or pain. ? Fluid or blood. ? Warmth. ? Pus or a bad smell. General instructions  Do not cross your legs. This may decrease blood flow to your feet.  Do not use heating pads or hot water bottles on your feet. They may burn your skin. If you have lost feeling in your feet or legs, you may not know this is happening until it is too late.  Protect your feet from hot and cold by wearing shoes, such as at the beach or on hot pavement.  Schedule a complete foot exam at least once a year (annually) or more often if you have foot problems. If you have foot problems, report any cuts, sores, or bruises to your health care provider immediately. Contact a health care provider if:  You have a medical condition that increases your risk of infection and you have any cuts, sores, or bruises on your feet.  You have an injury that is not healing.  You have redness on your legs or feet.  You feel burning  or tingling in your legs or feet.  You have pain or cramps in your legs and feet.  Your legs or feet are numb.  Your feet always feel cold.  You have pain around a toenail. Get help right away if:  You have a wound, scrape, corn, or callus on your foot and: ? You have pain, swelling, or redness that gets worse. ? You have fluid or blood coming from the wound, scrape, corn, or callus. ? Your wound, scrape, corn, or callus feels warm to the touch. ? You have pus or a bad smell coming from the wound, scrape, corn, or callus. ? You have a fever. ? You have a red line going up your leg. Summary  Check your feet every day for cuts, sores, red spots, swelling, and blisters.  Moisturize feet and legs daily.  Wear shoes that fit properly and have enough cushioning.  If you have foot problems, report any cuts, sores, or bruises to your health care provider immediately.  Schedule a complete foot exam at least once a year (annually) or more often if you have foot problems. This information is not intended to replace advice given to you by your health care provider. Make sure you discuss any questions you have with your health care provider. Document Revised: 12/29/2018 Document Reviewed: 05/09/2016 Elsevier Patient Education  [place].    Plantar Fasciitis (Heel Spur Syndrome) with Rehab The plantar fascia is a fibrous, ligament-like, soft-tissue structure that spans the bottom of the foot. Plantar fasciitis is a condition that causes pain in the foot due to inflammation of the tissue. SYMPTOMS   Pain and tenderness on the underneath side of the foot.  Pain that worsens with standing or walking. CAUSES  Plantar fasciitis is caused by irritation and injury to the plantar fascia on the underneath side of the foot. Common mechanisms of injury include:  Direct trauma to bottom of the foot.  Damage to  a small nerve that runs under the foot where the main fascia attaches to  the heel bone.  Stress placed on the plantar fascia due to bone spurs. RISK INCREASES WITH:   Activities that place stress on the plantar fascia (running, jumping, pivoting, or cutting).  Poor strength and flexibility.  Improperly fitted shoes.  Tight calf muscles.  Flat feet.  Failure to warm-up properly before activity.  Obesity. PREVENTION  Warm up and stretch properly before activity.  Allow for adequate recovery between workouts.  Maintain physical fitness:  Strength, flexibility, and endurance.  Cardiovascular fitness.  Maintain a health body weight.  Avoid stress on the plantar fascia.  Wear properly fitted shoes, including arch supports for individuals who have flat feet.  PROGNOSIS  If treated properly, then the symptoms of plantar fasciitis usually resolve without surgery. However, occasionally surgery is necessary.  RELATED COMPLICATIONS   Recurrent symptoms that may result in a chronic condition.  Problems of the lower back that are caused by compensating for the injury, such as limping.  Pain or weakness of the foot during push-off following surgery.  Chronic inflammation, scarring, and partial or complete fascia tear, occurring more often from repeated injections.  TREATMENT  Treatment initially involves the use of ice and medication to help reduce pain and inflammation. The use of strengthening and stretching exercises may help reduce pain with activity, especially stretches of the Achilles tendon. These exercises may be performed at home or with a therapist. Your caregiver may recommend that you use heel cups of arch supports to help reduce stress on the plantar fascia. Occasionally, corticosteroid injections are given to reduce inflammation. If symptoms persist for greater than 6 months despite non-surgical (conservative), then surgery may be recommended.   MEDICATION   If pain medication is necessary, then nonsteroidal anti-inflammatory  medications, such as aspirin and ibuprofen, or other minor pain relievers, such as acetaminophen, are often recommended.  Do not take pain medication within 7 days before surgery.  Prescription pain relievers may be given if deemed necessary by your caregiver. Use only as directed and only as much as you need.  Corticosteroid injections may be given by your caregiver. These injections should be reserved for the most serious cases, because they may only be given a certain number of times.  HEAT AND COLD  Cold treatment (icing) relieves pain and reduces inflammation. Cold treatment should be applied for 10 to 15 minutes every 2 to 3 hours for inflammation and pain and immediately after any activity that aggravates your symptoms. Use ice packs or massage the area with a piece of ice (ice massage).  Heat treatment may be used prior to performing the stretching and strengthening activities prescribed by your caregiver, physical therapist, or athletic trainer. Use a heat pack or soak the injury in warm water.  SEEK IMMEDIATE MEDICAL CARE IF:  Treatment seems to offer no benefit, or the condition worsens.  Any medications produce adverse side effects.  EXERCISES- RANGE OF MOTION (ROM) AND STRETCHING EXERCISES - Plantar Fasciitis (Heel Spur Syndrome) These exercises may help you when beginning to rehabilitate your injury. Your symptoms may resolve with or without further involvement from your physician, physical therapist or athletic trainer. While completing these exercises, remember:   Restoring tissue flexibility helps normal motion to return to the joints. This allows healthier, less painful movement and activity.  An effective stretch should be held for at least 30 seconds.  A stretch should never be painful. You should only feel a  gentle lengthening or release in the stretched tissue.  RANGE OF MOTION - Toe Extension, Flexion  Sit with your right / left leg crossed over your opposite  knee.  Grasp your toes and gently pull them back toward the top of your foot. You should feel a stretch on the bottom of your toes and/or foot.  Hold this stretch for 10 seconds.  Now, gently pull your toes toward the bottom of your foot. You should feel a stretch on the top of your toes and or foot.  Hold this stretch for 10 seconds. Repeat  times. Complete this stretch 3 times per day.   RANGE OF MOTION - Ankle Dorsiflexion, Active Assisted  Remove shoes and sit on a chair that is preferably not on a carpeted surface.  Place right / left foot under knee. Extend your opposite leg for support.  Keeping your heel down, slide your right / left foot back toward the chair until you feel a stretch at your ankle or calf. If you do not feel a stretch, slide your bottom forward to the edge of the chair, while still keeping your heel down.  Hold this stretch for 10 seconds. Repeat 3 times. Complete this stretch 2 times per day.   STRETCH  Gastroc, Standing  Place hands on wall.  Extend right / left leg, keeping the front knee somewhat bent.  Slightly point your toes inward on your back foot.  Keeping your right / left heel on the floor and your knee straight, shift your weight toward the wall, not allowing your back to arch.  You should feel a gentle stretch in the right / left calf. Hold this position for 10 seconds. Repeat 3 times. Complete this stretch 2 times per day.  STRETCH  Soleus, Standing  Place hands on wall.  Extend right / left leg, keeping the other knee somewhat bent.  Slightly point your toes inward on your back foot.  Keep your right / left heel on the floor, bend your back knee, and slightly shift your weight over the back leg so that you feel a gentle stretch deep in your back calf.  Hold this position for 10 seconds. Repeat 3 times. Complete this stretch 2 times per day.  STRETCH  Gastrocsoleus, Standing  Note: This exercise can place a lot of stress on  your foot and ankle. Please complete this exercise only if specifically instructed by your caregiver.   Place the ball of your right / left foot on a step, keeping your other foot firmly on the same step.  Hold on to the wall or a rail for balance.  Slowly lift your other foot, allowing your body weight to press your heel down over the edge of the step.  You should feel a stretch in your right / left calf.  Hold this position for 10 seconds.  Repeat this exercise with a slight bend in your right / left knee. Repeat 3 times. Complete this stretch 2 times per day.   STRENGTHENING EXERCISES - Plantar Fasciitis (Heel Spur Syndrome)  These exercises may help you when beginning to rehabilitate your injury. They may resolve your symptoms with or without further involvement from your physician, physical therapist or athletic trainer. While completing these exercises, remember:   Muscles can gain both the endurance and the strength needed for everyday activities through controlled exercises.  Complete these exercises as instructed by your physician, physical therapist or athletic trainer. Progress the resistance and repetitions only as guided.  STRENGTH - Towel Curls  Sit in a chair positioned on a non-carpeted surface.  Place your foot on a towel, keeping your heel on the floor.  Pull the towel toward your heel by only curling your toes. Keep your heel on the floor. Repeat 3 times. Complete this exercise 2 times per day.  STRENGTH - Ankle Inversion  Secure one end of a rubber exercise band/tubing to a fixed object (table, pole). Loop the other end around your foot just before your toes.  Place your fists between your knees. This will focus your strengthening at your ankle.  Slowly, pull your big toe up and in, making sure the band/tubing is positioned to resist the entire motion.  Hold this position for 10 seconds.  Have your muscles resist the band/tubing as it slowly pulls your  foot back to the starting position. Repeat 3 times. Complete this exercises 2 times per day.  Document Released: 04/07/2005 Document Revised: 06/30/2011 Document Reviewed: 07/20/2008 [facility] Patient Information 2014 [place], [place].

## 2019-11-22 NOTE — Progress Notes (Signed)
  Subjective:  Patient ID: Victoria Walker, female    DOB: Jun 13, 1950,  MRN: [id number]  Chief Complaint  Patient presents with  . Diabetic Foot Exam    Needs Basic Diabetic exam done; pt Diabetic Type 2; Sugar=did not check today; A1C=6.5 (11/17/19)  . Diabetic Shoes    Wants to get some new shoes today  . Foot Pain    Bilateral; bottom of heels; pt stated, "I have neuropathy in both feet and legs; Left foot is worse"    69 y.o. female presents with the above complaint. History confirmed with patient.  Heel pain is intermittent, she had plantar fasciotomy by another provider several years ago.  Objective:  Physical Exam: warm, good capillary refill, no trophic changes or ulcerative lesions, normal DP and PT pulses and somewhat reduced monofilament exam, she is inconsistent in the usual pulps of the toes Left Foot: No pain on palpation of the plantar fascia today, digital contractures present, flexible pes planus present Right Foot: Digital contractures present, flexible pes planus present   Assessment:   1. Pain in both feet   2. Plantar fasciitis   3. Type 2 diabetes mellitus with diabetic neuropathy, without long-term current use of insulin ([place])   4. Hammertoe of left foot   5. Hammertoe of right foot      Plan:  Patient was evaluated and treated and all questions answered.  Patient educated on diabetes. Discussed proper diabetic foot care and discussed risks and complications of disease. Educated patient in depth on reasons to return to the office immediately should he/she discover anything concerning or new on the feet. All questions answered. Discussed proper shoes as well.  -Appointment will be made for diabetic shoe fitting.  She also has custom orthotics that were made by another provider, and she thinks it may need to be refurbished.  When she comes for shoe fitting she can have her orthotics looked at as well.  -Continue to monitor plantar fasciotomy.  Advise she  can applied Voltaren gel as needed for pain.   Return in about 1 year (around 11/21/2020) for diabetic foot exam.   Lanae Crumbly, DPM 11/22/2019

## 2019-11-23 ENCOUNTER — Other Ambulatory Visit: Payer: Self-pay

## 2019-11-23 MED ORDER — FLUCONAZOLE 150 MG PO TABS
150.0000 mg | ORAL_TABLET | Freq: Once | ORAL | 0 refills | Status: AC
Start: 2019-11-23 — End: 2019-11-23

## 2019-11-24 DIAGNOSIS — Z1231 Encounter for screening mammogram for malignant neoplasm of breast: Secondary | ICD-10-CM | POA: Diagnosis not present

## 2019-11-24 LAB — HM MAMMOGRAPHY

## 2019-11-28 DIAGNOSIS — H35033 Hypertensive retinopathy, bilateral: Secondary | ICD-10-CM | POA: Diagnosis not present

## 2019-11-28 LAB — HM DIABETES EYE EXAM

## 2019-11-30 ENCOUNTER — Encounter: Payer: Self-pay | Admitting: *Deleted

## 2019-12-01 ENCOUNTER — Other Ambulatory Visit: Payer: Self-pay

## 2019-12-01 ENCOUNTER — Ambulatory Visit (INDEPENDENT_AMBULATORY_CARE_PROVIDER_SITE_OTHER): Payer: Self-pay | Admitting: Orthotics

## 2019-12-01 DIAGNOSIS — M79676 Pain in unspecified toe(s): Secondary | ICD-10-CM

## 2019-12-01 DIAGNOSIS — M722 Plantar fascial fibromatosis: Secondary | ICD-10-CM

## 2019-12-01 DIAGNOSIS — M79671 Pain in right foot: Secondary | ICD-10-CM

## 2019-12-01 DIAGNOSIS — M79672 Pain in left foot: Secondary | ICD-10-CM

## 2019-12-01 NOTE — Progress Notes (Signed)
Patient ordered DBS (no inserts) and also having CMFO recovered.

## 2019-12-07 ENCOUNTER — Other Ambulatory Visit (HOSPITAL_COMMUNITY)
Admission: RE | Admit: 2019-12-07 | Discharge: 2019-12-07 | Disposition: A | Discharge status: Home / Self Care | Payer: Medicare Other | Source: Ambulatory Visit | Attending: Family Medicine | Admitting: Family Medicine

## 2019-12-07 ENCOUNTER — Other Ambulatory Visit: Payer: Self-pay

## 2019-12-07 ENCOUNTER — Ambulatory Visit (INDEPENDENT_AMBULATORY_CARE_PROVIDER_SITE_OTHER): Payer: Medicare Other

## 2019-12-07 VITALS — BP 117/62 | HR 69 | Wt 175.0 lb

## 2019-12-07 DIAGNOSIS — N898 Other specified noninflammatory disorders of vagina: Secondary | ICD-10-CM | POA: Insufficient documentation

## 2019-12-07 DIAGNOSIS — R399 Unspecified symptoms and signs involving the genitourinary system: Secondary | ICD-10-CM | POA: Diagnosis not present

## 2019-12-07 LAB — POCT URINALYSIS DIPSTICK
Blood, UA: NEGATIVE
Glucose, UA: NEGATIVE
Nitrite, UA: NEGATIVE
Spec Grav, UA: 1.02 (ref 1.010–1.025)
pH, UA: 6.5 (ref 5.0–8.0)

## 2019-12-07 MED FILL — DILTIAZEM 24HR ER 360 MG CA: 360 | 90 days supply | Qty: 90 | Fill #3

## 2019-12-07 NOTE — Progress Notes (Signed)
Pt states she has been experiencing vaginal itching, irritation, burning with urination, and urinary frequency x 1 week.  Urine culture and self swab were sent to the lab.  Aviance [place] l Janicia Monterrosa, CMA

## 2019-12-07 NOTE — Progress Notes (Signed)
I have reviewed this chart and agree with the RN/CMA assessment and management.    K. Meryl Mackinzie Vuncannon, M.D. Attending [facility])   

## 2019-12-08 ENCOUNTER — Encounter: Payer: Medicare Other | Admitting: Gastroenterology

## 2019-12-09 ENCOUNTER — Telehealth: Payer: Self-pay

## 2019-12-09 DIAGNOSIS — C50911 Malignant neoplasm of unspecified site of right female breast: Secondary | ICD-10-CM | POA: Diagnosis not present

## 2019-12-09 DIAGNOSIS — B379 Candidiasis, unspecified: Secondary | ICD-10-CM

## 2019-12-09 LAB — URINE CULTURE

## 2019-12-09 MED ORDER — FLUCONAZOLE 150 MG PO TABS: ORAL_TABLET | ORAL | 1 refills | Status: DC

## 2019-12-09 MED FILL — FLUCONAZOLE 150 MG TABS: 150 | 1 days supply | Qty: 1 | Fill #0

## 2019-12-09 NOTE — Telephone Encounter (Signed)
Called pt to discuss results. Pt made aware that her self swab was unable to be processed because the liquid was spilled out of her vial. Pt states she can come back in to do another swab but she would like for a Rx to be sent in or yeast  Because she is having some vaginal itching. Pt made aware that she can come in to swab if symptoms does not improve after taking the Diflucan.Understanding was voiced. Medication was sent to the pharmacy.  Addy Mcmannis l Dezerae Freiberger, CMA

## 2019-12-12 ENCOUNTER — Other Ambulatory Visit: Payer: Self-pay | Admitting: Family Medicine

## 2019-12-12 DIAGNOSIS — B379 Candidiasis, unspecified: Secondary | ICD-10-CM

## 2019-12-12 MED ORDER — CEFADROXIL 500 MG PO CAPS: 500.0000 mg | ORAL_CAPSULE | Freq: Two times a day (BID) | ORAL | 0 refills | Status: AC

## 2019-12-12 MED ORDER — FLUCONAZOLE 150 MG PO TABS: ORAL_TABLET | ORAL | 1 refills | Status: DC

## 2019-12-12 MED FILL — FLUCONAZOLE 150 MG TABS: 150 | 3 days supply | Qty: 1 | Fill #0

## 2019-12-12 MED FILL — CEFADROXIL 500 MG CAPSULE: 500 | 5 days supply | Qty: 10 | Fill #0

## 2019-12-13 MED FILL — ATORVASTATIN CALCIUM 40 MG: 40 | 90 days supply | Qty: 66 | Fill #3

## 2019-12-13 MED FILL — SPIRONOLACTONE 25 MG TABS: 25 | 90 days supply | Qty: 135 | Fill #1

## 2019-12-13 MED FILL — LEVOTHYROXINE SODIUM 88 MCG: 88 | 90 days supply | Qty: 77 | Fill #3

## 2019-12-13 MED FILL — NexIUM 40 MG CPDR: 40 | 90 days supply | Qty: 90 | Fill #1

## 2019-12-15 ENCOUNTER — Encounter: Payer: Self-pay | Admitting: Gastroenterology

## 2019-12-15 ENCOUNTER — Ambulatory Visit (INDEPENDENT_AMBULATORY_CARE_PROVIDER_SITE_OTHER): Payer: Medicare Other | Admitting: Gastroenterology

## 2019-12-15 VITALS — BP 128/78 | HR 70 | Ht 65.0 in | Wt 174.0 lb

## 2019-12-15 DIAGNOSIS — Z8619 Personal history of other infectious and parasitic diseases: Secondary | ICD-10-CM | POA: Diagnosis not present

## 2019-12-15 DIAGNOSIS — Z86010 Personal history of colonic polyps: Secondary | ICD-10-CM | POA: Diagnosis not present

## 2019-12-15 DIAGNOSIS — R1310 Dysphagia, unspecified: Secondary | ICD-10-CM

## 2019-12-15 DIAGNOSIS — K219 Gastro-esophageal reflux disease without esophagitis: Secondary | ICD-10-CM | POA: Diagnosis not present

## 2019-12-15 MED ORDER — SUPREP BOWEL PREP KIT 17.5-3.13-1.6 GM/177ML PO SOLN: 1.0000 | ORAL | 0 refills | Status: DC

## 2019-12-15 MED FILL — SUPREP BOWEL PREP KIT: 17.5-3.13-1 | 1 days supply | Qty: 354 | Fill #0

## 2019-12-15 NOTE — Progress Notes (Signed)
[place] VISIT   Primary Care Provider Mosie Lukes, MD [facility] [address] [place] [address] [phone]  Referring Provider Mosie Lukes, MD [place],  [place] [address] [phone]  Patient Profile: Victoria Walker is a 69 y.o. female with a pmh significant for Diabetes, hypertension, hyperlipidemia, hypothyroidism, prior breast cancer, sciatica, GERD, reported hiatal hernia, colon polyps (TAs).  The patient presents to the [facility] for an evaluation and management of problem(s) noted below:  Problem List 1. Hx of adenomatous colonic polyps   2. Gastroesophageal reflux disease, unspecified whether esophagitis present   3. Dysphagia, unspecified type   4. History of Helicobacter infection     History of Present Illness This is the patient's first visit to the outpatient [facility].  Patient previously followed with Dr. Juanita Craver.  She had a colonoscopy in 2016.  3 polyps were found and removed.  One of the polyps returned as a tubular adenoma and the other 2 were hyperplastic polyps.  A 5-year colonoscopy was recommended at the time.  The patient states that she has not had any issues in regards to changes in her bowel habits.  She has known hemorrhoids but does not have significant bleeding from them or irritation.  The patient does have a history of acid reflux and heartburn.  She also has issues of belching.  She relays that years ago she underwent an upper endoscopy and was reportedly found to have H. pylori.  She does not recall stool antigen testing post procedure but may have had breath testing.  Patient describes having symptoms of pyrosis breakthrough her current treatment of Nexium.  She does describe some symptoms of dysphagia to solid foods although they are infrequent.  She is worried about why she has these episodes occurring.  She cannot recall if she has had a  dilation previously.  Patient does not take significant nonsteroidals or BC/Goody powders.  She takes a probiotic daily.  GI Review of Systems Positive as above Negative for odynophagia, nausea, vomiting, abdominal pain, change in bowel habits, melena, hematochezia  Review of Systems General: Denies fevers/chills/weight loss unintentionally HEENT: Denies oral lesions Cardiovascular: Denies chest pain/palpitations Pulmonary: Denies shortness of breath Gastroenterological: See HPI Genitourinary: Denies darkened urine Hematological: Denies easy bruising/bleeding Dermatological: Denies jaundice Psychological: Mood is stable   Medications Current Outpatient Medications  Medication Sig Dispense Refill  . aspirin 81 MG EC tablet Take 81 mg by mouth every other day. Swallow whole.    Marland Kitchen atorvastatin (LIPITOR) 40 MG tablet TAKE 1 TABLET BY MOUTH EVERY OTHER DAY, ALTERNATING WITH 1/2 TABLET 66 tablet 3  . AVAPRO 150 MG tablet TAKE 1/2 TO 1 TABLET BY MOUTH DAILY 90 tablet 1  . calcium-vitamin D (OSCAL WITH D) 250-125 MG-UNIT per tablet Take 1 tablet by mouth daily.     . cefadroxil (DURICEF) 500 MG capsule Take 1 capsule (500 mg total) by mouth 2 (two) times daily for 5 days. 10 capsule 0  . Cholecalciferol (VITAMIN D) 2000 UNITS CAPS Take 1 capsule by mouth every evening.     . Ciclopirox 1 % shampoo Apply 1 each topically every Friday.     . cycloSPORINE (RESTASIS) 0.05 % ophthalmic emulsion Place 1 drop into both eyes 2 (two) times daily.    Marland Kitchen diltiazem (TIAZAC) 360 MG 24 hr capsule TAKE 1 CAPSULE (360 MG TOTAL) BY MOUTH EVERY MORNING. 90 capsule 3  .  DOCOSAHEXAENOIC ACID PO Take by mouth.    . Estradiol (YUVAFEM) 10 MCG TABS vaginal tablet INSERT 1 TAB VAGINALLY AT BEDTIME EVERY OTHER DAY 16 tablet 12  . gabapentin (NEURONTIN) 300 MG capsule Take 1 capsule (300 mg total) by mouth at bedtime. 90 capsule 2  . glucose blood (ONE TOUCH TEST STRIPS) test strip Use as directed twice daily to check  blood sugar. DX code E11.9 300 each 1  . levocetirizine (XYZAL) 5 MG tablet One tablet once a day as needed for runny nose or itchy eyes 90 tablet 3  . levothyroxine (SYNTHROID) 88 MCG tablet 1 tab po daily except skip Sunday 90 tablet 3  . loteprednol (LOTEMAX) 0.5 % ophthalmic suspension Place 1 drop into the left eye daily as needed ("arthiritis of the eye").     . metFORMIN (GLUCOPHAGE XR) 750 MG 24 hr tablet Take 1 tablet (750 mg total) by mouth in the morning and at bedtime. 180 tablet 1  . metoprolol succinate (TOPROL-XL) 25 MG 24 hr tablet TAKE 1 TABLET BY MOUTH TWICE A DAY AT 12 NOON AND 4PM 180 tablet 1  . mometasone (NASONEX) 50 MCG/ACT nasal spray USE ONE SPRAY IN EACH NOSTRIL TWICE DAILY IF NEEDED FOR STUFFY NOSE 51 g 3  . NEXIUM 40 MG capsule TAKE 1 CAPSULE BY MOUTH IN THE MORNING 90 capsule 1  . polyvinyl alcohol-povidone (REFRESH) 1.4-0.6 % ophthalmic solution Place 1 drop into both eyes 2 (two) times daily.    . Probiotic Product (PROBIOTIC DAILY PO) Take 1 capsule by mouth daily.    Marland Kitchen spironolactone (ALDACTONE) 25 MG tablet TAEK 1 & 1/2 TAB BY MOUTH EVERY DAY 135 tablet 1  . Na Sulfate-K Sulfate-Mg Sulf (SUPREP BOWEL PREP KIT) 17.5-3.13-1.6 GM/177ML SOLN Take 1 kit by mouth as directed. For colonoscopy prep 354 mL 0   No current facility-administered medications for this visit.    Allergies Allergies  Allergen Reactions  . Codeine Anxiety    Jittery   . Penicillins Itching  . Sulfa Antibiotics Itching  . Vicodin [Hydrocodone-Acetaminophen] Itching    ITCHING     Histories Past Medical History:  Diagnosis Date  . Arthritis    "both knees" (07/02/2015)  . Cancer of right breast ([place])   . Claustrophobia   . Dysrhythmia    HX OF FAST HEART RATE AND PALPITATIONS - METOPROLOL HAS HELPED  . GERD (gastroesophageal reflux disease)   . H/O hiatal hernia   . H/O iritis    LEFT EYE - STATES HER EYE BECOMES RED AND VERY SENSITIVE TO LIGHT WHEN FLARE UP OF IRITIS  . Heart  murmur    "benign; 06/2015"  . High cholesterol   . History of blood transfusion    "when I had my partial hysterectomy"  . History of chickenpox 09/26/2014  . History of measles   . History of MRSA infection   . History of shingles   . Hypertension   . Hypothyroidism   . Medicare annual wellness visit, initial 04/07/2015  . Multinodular thyroid    PT HAVING TROUBLE SWALLOWING  . Neuropathy    FEET  . Primary localized osteoarthritis of right knee   . Sciatica of right side 04/25/2015  . Spinal headache    spinal headache with epidural  . Total knee replacement status, right   . Type II diabetes mellitus (HCC)    ORAL MEDICATION - NO INSULIN  . Wears glasses    Past Surgical History:  Procedure Laterality Date  .  ABDOMINAL HYSTERECTOMY  1988  . ABDOMINOPLASTY  1986  . ANTERIOR AND POSTERIOR REPAIR  05/11/2012   Procedure: ANTERIOR (CYSTOCELE) AND POSTERIOR REPAIR (RECTOCELE);  Surgeon: Reece Packer, MD;  Location: [facility];  Service: Urology;;  with graft  . BREAST BIOPSY Right   . CARDIAC CATHETERIZATION  1999  . COLONOSCOPY    . CYSTOSCOPY WITH URETHRAL DILATATION  05/11/2012   Procedure: CYSTOSCOPY WITH URETHRAL DILATATION;  Surgeon: Reece Packer, MD;  Location: [facility];  Service: Urology;;  . ESOPHAGOGASTRODUODENOSCOPY    . HERNIA REPAIR    . MASTECTOMY Right 2007  . PLANTAR FASCIA SURGERY Left   . THYROIDECTOMY N/A 02/23/2014   Procedure: TOTAL THYROIDECTOMY;  Surgeon: Armandina Gemma, MD;  Location: [facility];  Service: General;  Laterality: N/A;  . TOTAL KNEE ARTHROPLASTY Right 07/02/2015  . TOTAL KNEE ARTHROPLASTY Right 07/02/2015   Procedure: TOTAL KNEE ARTHROPLASTY;  Surgeon: Elsie Saas, MD;  Location: [facility];  Service: Orthopedics;  Laterality: Right;  . TOTAL THYROIDECTOMY  1976   removed three tumor (2 on right; 1 on left)  . TUBAL LIGATION    . UMBILICAL HERNIA REPAIR  1986  . VAGINAL PROLAPSE REPAIR  05/11/2012   Procedure: VAGINAL VAULT SUSPENSION;  Surgeon:  Reece Packer, MD;  Location: [facility];  Service: Urology;;  . Clayborne Dana / PERINEUM BIOPSY     vulvar   Social History   Socioeconomic History  . Marital status: Married    Spouse name: Not on file  . Number of children: 3  . Years of education: Not on file  . Highest education level: Not on file  Occupational History    Comment: Retired  Tobacco Use  . Smoking status: Never Smoker  . Smokeless tobacco: Never Used  Vaping Use  . Vaping Use: Never used  Substance and Sexual Activity  . Alcohol use: No    Alcohol/week: 0.0 standard drinks  . Drug use: No  . Sexual activity: Not Currently    Birth control/protection: Surgical    Comment: lives with husband, retired from various jobs, diabetic diet  Other Topics Concern  . Not on file  Social History Narrative  . Not on file   Social Determinants of Health   Financial Resource Strain:   . Difficulty of Paying Living Expenses: Not on file  Food Insecurity:   . Worried About Charity fundraiser in the Last Year: Not on file  . Ran Out of Food in the Last Year: Not on file  Transportation Needs:   . Lack of Transportation (Medical): Not on file  . Lack of Transportation (Non-Medical): Not on file  Physical Activity:   . Days of Exercise per Week: Not on file  . Minutes of Exercise per Session: Not on file  Stress:   . Feeling of Stress : Not on file  Social Connections:   . Frequency of Communication with Friends and Family: Not on file  . Frequency of Social Gatherings with Friends and Family: Not on file  . Attends Religious Services: Not on file  . Active Member of Clubs or Organizations: Not on file  . Attends Archivist Meetings: Not on file  . Marital Status: Not on file  Intimate Partner Violence:   . Fear of Current or Ex-Partner: Not on file  . Emotionally Abused: Not on file  . Physically Abused: Not on file  . Sexually Abused: Not on file   Family History  Problem Relation Age of  Onset  .  Hypertension Mother   . Heart disease Mother        pacer, CHF  . Hyperlipidemia Mother   . Kidney disease Mother        1 kidney due to stone  . Kidney Stones Mother   . Hypertension Father   . Diabetes Father   . Alcohol abuse Father   . Lung cancer Father        lung, psa  . Diabetes Sister   . Hypertension Sister   . Breast cancer Sister   . Diabetes Brother   . Hypertension Brother   . Hyperlipidemia Brother   . Kidney disease Brother        dialysis  . Stroke Brother   . Hypertension Maternal Grandmother   . Heart disease Maternal Grandmother   . Stroke Maternal Grandmother   . Hypertension Maternal Grandfather   . Hypertension Daughter   . Arthritis Son   . Hyperlipidemia Sister   . Hypertension Sister   . Diabetes Sister   . Thyroid disease Sister   . Kidney disease Sister   . Endometriosis Sister   . Breast cancer Sister        thyroid and breast  . Thyroid cancer Sister   . Arthritis Sister        back pain  . Hypertension Sister   . Thyroid disease Sister   . Hypertension Brother   . Hyperlipidemia Brother   . Hypertension Brother   . Hyperlipidemia Brother   . Benign prostatic hyperplasia Brother   . Hypertension Daughter   . Hyperlipidemia Daughter   . Diabetes Daughter   . Hypertension Maternal Aunt   . Hypertension Maternal Uncle   . Hypertension Paternal Aunt   . Cancer Paternal Aunt   . Hypertension Paternal Uncle   . Colon cancer Neg Hx   . Stomach cancer Neg Hx   . Esophageal cancer Neg Hx   . Pancreatic cancer Neg Hx   . Liver disease Neg Hx   . Rectal cancer Neg Hx   . Inflammatory bowel disease Neg Hx    I have reviewed her medical, social, and family history in detail and updated the electronic medical record as necessary.    PHYSICAL EXAMINATION  BP 128/78   Pulse 70   Ht $R'5\' 5"'iE$  (1.651 m)   Wt 174 lb (78.9 kg)   BMI 28.96 kg/m  Wt Readings from Last 3 Encounters:  12/15/19 174 lb (78.9 kg)  12/07/19 175 lb (79.4 kg)   11/17/19 170 lb 12.8 oz (77.5 kg)  GEN: NAD, appears stated age, doesn't appear chronically ill PSYCH: Cooperative, without pressured speech EYE: Conjunctivae pink, sclerae anicteric ENT: MMM CV: Nontachycardic RESP: No audible wheezing GI: NABS, soft, rounded, NT/ND, without rebound or guarding MSK/EXT: No significant lower extremity edema SKIN: No jaundice NEURO:  Alert & Oriented x 3, no focal deficits   REVIEW OF DATA  I reviewed the following data at the time of this encounter:  GI Procedures and Studies  2016 colonoscopy Two 5 mm sessile polyps were found in the rectosigmoid colon.  These polyps were removed with a cold snare.  A 6 mm sessile polyp was found in the proximal transverse colon.  The polyp was removed with a cold snare.  The exam was otherwise without abnormality on direct and retroflexion views.  Laboratory Studies  Reviewed those in epic and care everywhere  Imaging Studies  No relevant studies to review   ASSESSMENT  Ms. Eckford is a 69 y.o. female with a pmh significant for Diabetes, hypertension, hyperlipidemia, hypothyroidism, prior breast cancer, sciatica, GERD, reported hiatal hernia, colon polyps (TAs).  The patient is seen today for evaluation and management of:  1. Hx of adenomatous colonic polyps   2. Gastroesophageal reflux disease, unspecified whether esophagitis present   3. Dysphagia, unspecified type   4. History of Helicobacter infection    The patient is hemodynamically clinically stable.  She is due for colonoscopy for colon cancer screening based on her previous recommendation of a 5-year follow-up.  Certainly with our new guidelines after I perform her colonoscopy we will get her up-to-date on surveillance based on current guidelines from 2021.  The patient also has a history of GERD as well as some dysphagia.  Seems like she has a previous history of H. pylori as well and likely had breath testing but we cannot confirm eradication based  on the notes that we have.  Diagnostic endoscopy for her symptoms that are occurring through current PPI dosing is indicated.  We will plan a potential esophageal dilation as well for her dysphagia.  The risks and benefits of endoscopic evaluation were discussed with the patient; these include but are not limited to the risk of perforation, infection, bleeding, missed lesions, lack of diagnosis, severe illness requiring hospitalization, as well as anesthesia and sedation related illnesses.  The patient is agreeable to proceed.  All patient questions were answered to the best of my ability, and the patient agrees to the aforementioned plan of action with follow-up as indicated.   PLAN  Continue current Nexium 40 mg daily Proceed with scheduling diagnostic endoscopy with esophageal and gastric biopsies at minimum and esophageal dilation likely Proceed with scheduling surveillance colonoscopy   Orders Placed This Encounter  Procedures  . Ambulatory referral to Gastroenterology    New Prescriptions   NA SULFATE-K SULFATE-MG SULF (SUPREP BOWEL PREP KIT) 17.5-3.13-1.6 GM/177ML SOLN    Take 1 kit by mouth as directed. For colonoscopy prep   Modified Medications   No medications on file    Planned Follow Up No follow-ups on file.   Total Time in Face-to-Face and in Coordination of Care for patient including independent/personal interpretation/review of prior testing, medical history, examination, medication adjustment, communicating results with the patient directly, and documentation with the EHR is 45 minutes.   Justice Britain, MD [facility]oscopy Office # [phone]

## 2019-12-15 NOTE — Patient Instructions (Signed)
If you are age 70 or older, your body mass index should be between 23-30. Your Body mass index is 28.96 kg/m. If this is out of the aforementioned range listed, please consider follow up with your Primary Care Provider.  If you are age 5 or younger, your body mass index should be between 19-25. Your Body mass index is 28.96 kg/m. If this is out of the aformentioned range listed, please consider follow up with your Primary Care Provider.   We have sent the following medications to your pharmacy for you to pick up at your convenience: [place] have been scheduled for an endoscopy and colonoscopy. Please follow the written instructions given to you at your visit today. Please pick up your prep supplies at the pharmacy within the next 1-3 days. If you use inhalers (even only as needed), please bring them with you on the day of your procedure.   Thank you for choosing me and [facility].  Dr. Rush Landmark

## 2019-12-17 ENCOUNTER — Encounter: Payer: Self-pay | Admitting: Gastroenterology

## 2019-12-17 DIAGNOSIS — Z86010 Personal history of colonic polyps: Secondary | ICD-10-CM | POA: Insufficient documentation

## 2019-12-17 DIAGNOSIS — R1310 Dysphagia, unspecified: Secondary | ICD-10-CM | POA: Insufficient documentation

## 2019-12-17 DIAGNOSIS — Z8619 Personal history of other infectious and parasitic diseases: Secondary | ICD-10-CM | POA: Insufficient documentation

## 2019-12-17 DIAGNOSIS — K219 Gastro-esophageal reflux disease without esophagitis: Secondary | ICD-10-CM | POA: Insufficient documentation

## 2020-01-04 ENCOUNTER — Other Ambulatory Visit: Payer: Self-pay

## 2020-01-04 ENCOUNTER — Other Ambulatory Visit: Payer: Medicare Other | Admitting: Orthotics

## 2020-01-04 DIAGNOSIS — M2041 Other hammer toe(s) (acquired), right foot: Secondary | ICD-10-CM | POA: Diagnosis not present

## 2020-01-04 DIAGNOSIS — M216X2 Other acquired deformities of left foot: Secondary | ICD-10-CM | POA: Diagnosis not present

## 2020-01-04 DIAGNOSIS — E1140 Type 2 diabetes mellitus with diabetic neuropathy, unspecified: Secondary | ICD-10-CM | POA: Diagnosis not present

## 2020-01-04 DIAGNOSIS — M2042 Other hammer toe(s) (acquired), left foot: Secondary | ICD-10-CM | POA: Diagnosis not present

## 2020-01-04 DIAGNOSIS — M216X1 Other acquired deformities of right foot: Secondary | ICD-10-CM | POA: Diagnosis not present

## 2020-01-05 DIAGNOSIS — Z299 Encounter for prophylactic measures, unspecified: Secondary | ICD-10-CM | POA: Diagnosis not present

## 2020-01-05 DIAGNOSIS — L299 Pruritus, unspecified: Secondary | ICD-10-CM | POA: Diagnosis not present

## 2020-01-05 DIAGNOSIS — L089 Local infection of the skin and subcutaneous tissue, unspecified: Secondary | ICD-10-CM | POA: Diagnosis not present

## 2020-01-09 ENCOUNTER — Ambulatory Visit: Payer: Medicare Other | Admitting: Family Medicine

## 2020-01-17 MED FILL — LEVOCETIRIZINE 5 MG TABLET: 5 | 90 days supply | Qty: 90 | Fill #2

## 2020-01-17 MED FILL — GABAPENTIN 300 MG CAPSULE: 300 | 90 days supply | Qty: 90 | Fill #2

## 2020-01-17 MED FILL — METOPROLOL SUCCINATE ER 25: 25 | 90 days supply | Qty: 180 | Fill #1

## 2020-01-23 DIAGNOSIS — E1140 Type 2 diabetes mellitus with diabetic neuropathy, unspecified: Secondary | ICD-10-CM | POA: Diagnosis not present

## 2020-02-06 ENCOUNTER — Ambulatory Visit: Payer: Medicare Other | Attending: Internal Medicine

## 2020-02-06 ENCOUNTER — Other Ambulatory Visit (HOSPITAL_BASED_OUTPATIENT_CLINIC_OR_DEPARTMENT_OTHER): Payer: Self-pay | Admitting: Internal Medicine

## 2020-02-06 DIAGNOSIS — Z23 Encounter for immunization: Secondary | ICD-10-CM

## 2020-02-06 NOTE — Progress Notes (Signed)
   Covid-19 Vaccination Clinic  Name:  Victoria Walker    MRN: [id number] DOB: 04/21/1951  02/06/2020  Victoria Walker was observed post Covid-19 immunization for 15 minutes without incident. She was provided with Vaccine Information Sheet and instruction to access the V-Safe system. Vaccinated by Harriet Pho.  Victoria Walker was instructed to call 911 with any severe reactions post vaccine: Marland Kitchen Difficulty breathing  . Swelling of face and throat  . A fast heartbeat  . A bad rash all over body  . Dizziness and weakness

## 2020-02-10 ENCOUNTER — Other Ambulatory Visit (HOSPITAL_BASED_OUTPATIENT_CLINIC_OR_DEPARTMENT_OTHER): Payer: Self-pay | Admitting: Internal Medicine

## 2020-02-10 MED FILL — FLUAD QUADRIVALENT 0.5 ML P: 0.5 | 1 days supply | Qty: 1 | Fill #0

## 2020-02-14 ENCOUNTER — Encounter: Payer: Self-pay | Admitting: Gastroenterology

## 2020-02-14 ENCOUNTER — Other Ambulatory Visit: Payer: Self-pay

## 2020-02-14 ENCOUNTER — Ambulatory Visit (AMBULATORY_SURGERY_CENTER): Payer: Medicare Other | Admitting: Gastroenterology

## 2020-02-14 VITALS — BP 118/64 | HR 70 | Temp 97.1°F | Resp 12 | Ht 65.0 in | Wt 174.0 lb

## 2020-02-14 DIAGNOSIS — K449 Diaphragmatic hernia without obstruction or gangrene: Secondary | ICD-10-CM

## 2020-02-14 DIAGNOSIS — K219 Gastro-esophageal reflux disease without esophagitis: Secondary | ICD-10-CM

## 2020-02-14 DIAGNOSIS — D120 Benign neoplasm of cecum: Secondary | ICD-10-CM

## 2020-02-14 DIAGNOSIS — K259 Gastric ulcer, unspecified as acute or chronic, without hemorrhage or perforation: Secondary | ICD-10-CM

## 2020-02-14 DIAGNOSIS — R1310 Dysphagia, unspecified: Secondary | ICD-10-CM

## 2020-02-14 DIAGNOSIS — D123 Benign neoplasm of transverse colon: Secondary | ICD-10-CM

## 2020-02-14 DIAGNOSIS — B9681 Helicobacter pylori [H. pylori] as the cause of diseases classified elsewhere: Secondary | ICD-10-CM | POA: Diagnosis not present

## 2020-02-14 DIAGNOSIS — Z86010 Personal history of colonic polyps: Secondary | ICD-10-CM | POA: Diagnosis not present

## 2020-02-14 DIAGNOSIS — K2900 Acute gastritis without bleeding: Secondary | ICD-10-CM

## 2020-02-14 DIAGNOSIS — D128 Benign neoplasm of rectum: Secondary | ICD-10-CM

## 2020-02-14 DIAGNOSIS — K2950 Unspecified chronic gastritis without bleeding: Secondary | ICD-10-CM | POA: Diagnosis not present

## 2020-02-14 MED ORDER — SODIUM CHLORIDE 0.9 % IV SOLN: 500.0000 mL | INTRAVENOUS | Status: DC

## 2020-02-14 MED FILL — PFIZER-BIONTECH COVID-19 VA: 30 | 1 days supply | Qty: 0 | Fill #0

## 2020-02-14 NOTE — Progress Notes (Signed)
To PACU, VSS. Report to [email or web address] 

## 2020-02-14 NOTE — Progress Notes (Signed)
Called to room to assist during endoscopic procedure.  Patient ID and intended procedure confirmed with present staff. Received instructions for my participation in the procedure from the performing physician.  

## 2020-02-14 NOTE — Op Note (Signed)
[facility] Patient Name: Victoria Walker Procedure Date: 02/14/2020 11:09 AM MRN: [id number] Endoscopist: Justice Britain , MD Age: 68 Referring MD:  Date of Birth: Oct 12, 1950 Gender: Female Account #: [id number] Procedure:                Upper GI endoscopy Indications:              Dysphagia, Heartburn Medicines:                Monitored Anesthesia Care Procedure:                Pre-Anesthesia Assessment:                           - Prior to the procedure, a History and Physical                            was performed, and patient medications and                            allergies were reviewed. The patient's tolerance of                            previous anesthesia was also reviewed. The risks                            and benefits of the procedure and the sedation                            options and risks were discussed with the patient.                            All questions were answered, and informed consent                            was obtained. Prior Anticoagulants: The patient has                            taken no previous anticoagulant or antiplatelet                            agents. ASA Grade Assessment: III - A patient with                            severe systemic disease. After reviewing the risks                            and benefits, the patient was deemed in                            satisfactory condition to undergo the procedure.                           After obtaining informed consent, the endoscope was  passed under direct vision. Throughout the                            procedure, the patient's blood pressure, pulse, and                            oxygen saturations were monitored continuously. The                            Endoscope was introduced through the mouth, and                            advanced to the second part of duodenum. The upper                            GI endoscopy was  accomplished without difficulty.                            The patient tolerated the procedure. Scope In: Scope Out: Findings:                 No gross lesions were noted in the entire                            esophagus. Biopsies were taken with a cold forceps                            for histology. After the rest of the EGD was                            complete guidewire was placed and the scope was                            withdrawn. Dilation was performed with a Savary                            dilator with no resistance at 18 mm. The dilation                            site was examined following endoscope reinsertion                            and showed no change.                           A 4 cm hiatal hernia was found. The proximal extent                            of the gastric folds (end of tubular esophagus) was                            35 cm from the incisors. The hiatal narrowing was  39 cm from the incisors. The Z-line was a variable                            distance from incisors; the hiatal hernia was                            sliding.                           Patchy moderate inflammation characterized by                            nodular erosions, erythema, friability and                            granularity was found in the gastric body, at the                            incisura and in the gastric antrum.                           No other gross lesions were noted in the entire                            examined stomach. Biopsies were taken with a cold                            forceps for histology and Helicobacter pylori                            testing.                           No gross lesions were noted in the duodenal bulb,                            in the first portion of the duodenum and in the                            second portion of the duodenum. Complications:            No immediate  complications. Estimated Blood Loss:     Estimated blood loss was minimal. Impression:               - No gross lesions in esophagus. Biopsied. Dilated.                           - 4 cm hiatal hernia.                           - Gastritis. No other gross lesions in the stomach.                            Biopsied.                           -  No gross lesions in the duodenal bulb, in the                            first portion of the duodenum and in the second                            portion of the duodenum. Recommendation:           - Proceed to scheduled colonoscopy.                           - Observe patient's clinical course.                           - Continue present medications.                           - Await pathology results.                           - If issues of dypshagia persist then would                            consider Manometry as next steps in evaluation.                           - The findings and recommendations were discussed                            with the patient.                           - The findings and recommendations were discussed                            with the patient's family. Justice Britain, MD 02/14/2020 12:08:11 PM

## 2020-02-14 NOTE — Op Note (Signed)
[place] Patient Name: Victoria Walker Procedure Date: 02/14/2020 11:09 AM MRN: [id number] Endoscopist: Justice Britain , MD Age: 69 Referring MD:  Date of Birth: [birth year] Gender: Female Account #: [id number] Procedure:                Colonoscopy Indications:              Surveillance: Personal history of adenomatous                            polyps on last colonoscopy 5 years ago Medicines:                Monitored Anesthesia Care Procedure:                Pre-Anesthesia Assessment:                           - Prior to the procedure, a History and Physical                            was performed, and patient medications and                            allergies were reviewed. The patient's tolerance of                            previous anesthesia was also reviewed. The risks                            and benefits of the procedure and the sedation                            options and risks were discussed with the patient.                            All questions were answered, and informed consent                            was obtained. Prior Anticoagulants: The patient has                            taken no previous anticoagulant or antiplatelet                            agents except for aspirin. ASA Grade Assessment:                            III - A patient with severe systemic disease. After                            reviewing the risks and benefits, the patient was                            deemed in satisfactory condition to undergo the  procedure.                           After obtaining informed consent, the colonoscope                            was passed under direct vision. Throughout the                            procedure, the patient's blood pressure, pulse, and                            oxygen saturations were monitored continuously. The                            Colonoscope was introduced through the anus  and                            advanced to the the cecum, identified by                            appendiceal orifice and ileocecal valve. The                            colonoscopy was performed without difficulty. The                            patient tolerated the procedure. The quality of the                            bowel preparation was adequate. The ileocecal                            valve, appendiceal orifice, and rectum were                            photographed. Scope In: 11:42:53 AM Scope Out: 12:01:46 PM Scope Withdrawal Time: 0 hours 15 minutes 30 seconds  Total Procedure Duration: 0 hours 18 minutes 53 seconds  Findings:                 The digital rectal exam findings include                            hemorrhoids. Pertinent negatives include no                            palpable rectal lesions.                           A large amount of liquid semi-liquid stool was                            found in the entire colon, interfering with  visualization. Lavage of the area was performed                            using copious amounts, resulting in clearance with                            adequate visualization.                           Seven sessile polyps were found in the rectum (1),                            descending colon (1), transverse colon (3), hepatic                            flexure (1) and cecum (1). The polyps were 3 to 8                            mm in size. These polyps were removed with a cold                            snare. Resection and retrieval were complete.                           Multiple small-mouthed diverticula were found in                            the recto-sigmoid colon and sigmoid colon.                           Normal mucosa was found in the entire colon                            otherwise.                           Non-bleeding non-thrombosed external and internal                             hemorrhoids were found during retroflexion, during                            perianal exam and during digital exam. The                            hemorrhoids were Grade II (internal hemorrhoids                            that prolapse but reduce spontaneously). Complications:            No immediate complications. Estimated Blood Loss:     Estimated blood loss was minimal. Impression:               - Hemorrhoids found on digital rectal exam.                           -  Stool in the entire examined colon.                           - Seven 3 to 8 mm polyps in the rectum, in the                            descending colon, in the transverse colon, at the                            hepatic flexure and in the cecum, removed with a                            cold snare. Resected and retrieved.                           - Diverticulosis in the recto-sigmoid colon and in                            the sigmoid colon.                           - Normal mucosa in the entire examined colon                            otherwise.                           - Non-bleeding non-thrombosed external and internal                            hemorrhoids. Recommendation:           - The patient will be observed post-procedure,                            until all discharge criteria are met.                           - Discharge patient to home.                           - Patient has a contact number available for                            emergencies. The signs and symptoms of potential                            delayed complications were discussed with the                            patient. Return to normal activities tomorrow.                            Written discharge instructions were provided to the  patient.                           - High fiber diet.                           - Use FiberCon 1-2 tablets PO daily.                           - Continue present medications.                            - Await pathology results.                           - Repeat colonoscopy in 3 years for surveillance                            based on pathology results.                           - The findings and recommendations were discussed                            with the patient.                           - The findings and recommendations were discussed                            with the patient's family. Justice Britain, MD 02/14/2020 12:12:52 PM

## 2020-02-14 NOTE — Patient Instructions (Signed)
Thank you for allowing [place] to care for you today!  Await results, approximately 1-2 weeks.  Most likely will recommend a follow-up colonoscopy in 3 years.  Final decision to be made once results are back.  Recommend following a high fiber diet and adding  FiberCon tablets, 1-2 tablets daily with plenty of water.  Please follow a liquid/soft diet today.  See Handout.  If swallowing does not improve over the next few days, please do not hesitate to contact us.     YOU HAD AN ENDOSCOPIC PROCEDURE TODAY AT [facility]:   Refer to the procedure report that was given to you for any specific questions about what was found during the examination.  If the procedure report does not answer your questions, please call your gastroenterologist to clarify.  If you requested that your care partner not be given the details of your procedure findings, then the procedure report has been included in a sealed envelope for you to review at your convenience later.  YOU SHOULD EXPECT: Some feelings of bloating in the abdomen. Passage of more gas than usual.  Walking can help get rid of the air that was put into your GI tract during the procedure and reduce the bloating. If you had a lower endoscopy (such as a colonoscopy or flexible sigmoidoscopy) you may notice spotting of blood in your stool or on the toilet paper. If you underwent a bowel prep for your procedure, you may not have a normal bowel movement for a few days.  Please Note:  You might notice some irritation and congestion in your nose or some drainage.  This is from the oxygen used during your procedure.  There is no need for concern and it should clear up in a day or so.  SYMPTOMS TO REPORT IMMEDIATELY:   Following lower endoscopy (colonoscopy or flexible sigmoidoscopy):  Excessive amounts of blood in the stool  Significant tenderness or worsening of abdominal pains  Swelling of the abdomen that is new, acute  Fever of 100F or  higher   Following upper endoscopy (EGD)  Vomiting of blood or coffee ground material  New chest pain or pain under the shoulder blades  Painful or persistently difficult swallowing  New shortness of breath  Fever of 100F or higher  Black, tarry-looking stools  For urgent or emergent issues, a gastroenterologist can be reached at any hour by calling [phone]. Do not use MyChart messaging for urgent concerns.    DIET:  We do recommend a small meal at first, but then you may proceed to your regular diet.  Drink plenty of fluids but you should avoid alcoholic beverages for 24 hours.  ACTIVITY:  You should plan to take it easy for the rest of today and you should NOT DRIVE or use heavy machinery until tomorrow (because of the sedation medicines used during the test).    FOLLOW UP: Our staff will call the number listed on your records 48-72 hours following your procedure to check on you and address any questions or concerns that you may have regarding the information given to you following your procedure. If we do not reach you, we will leave a message.  We will attempt to reach you two times.  During this call, we will ask if you have developed any symptoms of COVID 19. If you develop any symptoms (ie: fever, flu-like symptoms, shortness of breath, cough etc.) before then, please call [phone].  If you test positive for Covid 19  in the 2 weeks post procedure, please call and report this information to [place].    If any biopsies were taken you will be contacted by phone or by letter within the next 1-3 weeks.  Please call us at [phone] if you have not heard about the biopsies in 3 weeks.    SIGNATURES/CONFIDENTIALITY: You and/or your care partner have signed paperwork which will be entered into your electronic medical record.  These signatures attest to the fact that that the information above on your After Visit Summary has been reviewed and is understood.  Full responsibility of  the confidentiality of this discharge information lies with you and/or your care-partner.

## 2020-02-14 NOTE — Progress Notes (Signed)
Vitals by CW 

## 2020-02-16 ENCOUNTER — Telehealth: Payer: Self-pay

## 2020-02-16 MED FILL — MOMETASONE FUROATE 50 MCG S: 50 | 90 days supply | Qty: 51 | Fill #2

## 2020-02-16 MED FILL — METFORMIN HCL ER 750 MG TB2: 750 | 90 days supply | Qty: 180 | Fill #1

## 2020-02-16 MED FILL — AVAPRO 150 MG TABLET: 150 | 90 days supply | Qty: 90 | Fill #1

## 2020-02-16 MED FILL — RESTASIS 0.05% EYE EMULSION: 0.05 | 90 days supply | Qty: 180 | Fill #4

## 2020-02-16 NOTE — Telephone Encounter (Signed)
  Follow up Call-  Call back number 02/14/2020  Post procedure Call Back phone  # [phone]  Permission to leave phone message Yes  Some recent data might be hidden     Patient questions:  Do you have a fever, pain , or abdominal swelling? No. Pain Score  0 *  Have you tolerated food without any problems? Yes.    Have you been able to return to your normal activities? Yes.    Do you have any questions about your discharge instructions: Diet   No. Medications  No. Follow up visit  No.  Do you have questions or concerns about your Care? No.  Actions: * If pain score is 4 or above: No action needed, pain <4.   1. Have you developed a fever since your procedure? No   2.   Have you had an respiratory symptoms (SOB or cough) since your procedure? No   3.   Have you tested positive for COVID 19 since your procedure? No   4.   Have you had any family members/close contacts diagnosed with the COVID 19 since your procedure?  No    If yes to any of these questions please route to Joylene John, RN and Joella Prince, RN

## 2020-02-21 ENCOUNTER — Other Ambulatory Visit: Payer: Self-pay | Admitting: Family Medicine

## 2020-02-22 ENCOUNTER — Encounter: Payer: Self-pay | Admitting: Gastroenterology

## 2020-02-23 ENCOUNTER — Other Ambulatory Visit: Payer: Self-pay

## 2020-02-23 ENCOUNTER — Telehealth: Payer: Self-pay | Admitting: Gastroenterology

## 2020-02-23 ENCOUNTER — Other Ambulatory Visit: Payer: Self-pay | Admitting: Gastroenterology

## 2020-02-23 MED ORDER — FLUCONAZOLE 150 MG PO TABS: 150.0000 mg | ORAL_TABLET | ORAL | 0 refills | Status: DC

## 2020-02-23 MED ORDER — METRONIDAZOLE 250 MG PO TABS: 500.0000 mg | ORAL_TABLET | Freq: Four times a day (QID) | ORAL | 0 refills | Status: DC

## 2020-02-23 MED ORDER — TETRACYCLINE HCL 500 MG PO CAPS: 500.0000 mg | ORAL_CAPSULE | Freq: Four times a day (QID) | ORAL | 0 refills | Status: DC

## 2020-02-23 MED ORDER — BISMUTH SUBSALICYLATE 262 MG PO TABS: 2.0000 | ORAL_TABLET | Freq: Four times a day (QID) | ORAL | 0 refills | Status: AC

## 2020-02-23 MED ORDER — ESOMEPRAZOLE MAGNESIUM 40 MG PO CPDR: 40.0000 mg | DELAYED_RELEASE_CAPSULE | Freq: Two times a day (BID) | ORAL | 0 refills | Status: DC

## 2020-02-23 MED FILL — ESOMEPRAZOLE MAG DR 40 MG C: 40 | 14 days supply | Qty: 28 | Fill #0

## 2020-02-23 MED FILL — FLUCONAZOLE 150 MG TABS: 150 | 14 days supply | Qty: 5 | Fill #0

## 2020-02-23 MED FILL — METRONIDAZOLE 250 MG TABS: 250 | 14 days supply | Qty: 112 | Fill #0

## 2020-02-23 MED FILL — TETRACYCLINE HCL 500 MG CAP: 500 | 14 days supply | Qty: 56 | Fill #0

## 2020-02-23 NOTE — Telephone Encounter (Signed)
Pt called to inform that she could not find Fibercon at the store. However, she did find Citrucel fiber for irregularity. She wants to know if she can take that one instead. She also said that there is the possibility to order Fibercon online but she found two different types: one that is for irregularity and the other one for regularity so pt wants to know which one would be most appropriate for her. Pls call her.

## 2020-02-23 NOTE — Telephone Encounter (Signed)
The pt is going to try citrucel and call back if she has any further questions

## 2020-02-27 MED FILL — DILTIAZEM 24HR ER 360 MG CA: 360 | 90 days supply | Qty: 90 | Fill #0

## 2020-03-20 ENCOUNTER — Other Ambulatory Visit: Payer: Self-pay | Admitting: Family Medicine

## 2020-03-20 MED FILL — NexIUM 40 MG CPDR: 40 | 90 days supply | Qty: 90 | Fill #0

## 2020-03-20 MED FILL — LEVOTHYROXINE SODIUM 88 MCG: 88 | 60 days supply | Qty: 52 | Fill #4

## 2020-03-20 MED FILL — SPIRONOLACTONE 25 MG TABS: 25 | 90 days supply | Qty: 135 | Fill #0

## 2020-03-20 MED FILL — ATORVASTATIN CALCIUM 40 MG: 40 | 90 days supply | Qty: 66 | Fill #0

## 2020-03-26 DIAGNOSIS — M1712 Unilateral primary osteoarthritis, left knee: Secondary | ICD-10-CM | POA: Diagnosis not present

## 2020-04-02 ENCOUNTER — Other Ambulatory Visit: Payer: Self-pay | Admitting: Family Medicine

## 2020-04-02 DIAGNOSIS — R109 Unspecified abdominal pain: Secondary | ICD-10-CM

## 2020-04-02 DIAGNOSIS — E785 Hyperlipidemia, unspecified: Secondary | ICD-10-CM

## 2020-04-02 DIAGNOSIS — I10 Essential (primary) hypertension: Secondary | ICD-10-CM

## 2020-04-02 DIAGNOSIS — K219 Gastro-esophageal reflux disease without esophagitis: Secondary | ICD-10-CM

## 2020-04-02 DIAGNOSIS — Z780 Asymptomatic menopausal state: Secondary | ICD-10-CM

## 2020-04-02 MED FILL — GABAPENTIN 300 MG CAPSULE: 300 | 90 days supply | Qty: 90 | Fill #0

## 2020-04-02 MED FILL — LEVOCETIRIZINE 5 MG TABLET: 5 | 90 days supply | Qty: 90 | Fill #0

## 2020-04-19 ENCOUNTER — Encounter: Payer: Self-pay | Admitting: Gastroenterology

## 2020-04-19 ENCOUNTER — Other Ambulatory Visit: Payer: Medicare Other

## 2020-04-19 ENCOUNTER — Ambulatory Visit (INDEPENDENT_AMBULATORY_CARE_PROVIDER_SITE_OTHER): Payer: Medicare Other | Admitting: Gastroenterology

## 2020-04-19 VITALS — BP 98/58 | HR 74 | Ht 65.0 in | Wt 170.0 lb

## 2020-04-19 DIAGNOSIS — K449 Diaphragmatic hernia without obstruction or gangrene: Secondary | ICD-10-CM | POA: Diagnosis not present

## 2020-04-19 DIAGNOSIS — Z86010 Personal history of colonic polyps: Secondary | ICD-10-CM

## 2020-04-19 DIAGNOSIS — R1319 Other dysphagia: Secondary | ICD-10-CM

## 2020-04-19 DIAGNOSIS — Z8619 Personal history of other infectious and parasitic diseases: Secondary | ICD-10-CM | POA: Diagnosis not present

## 2020-04-19 NOTE — Patient Instructions (Addendum)
Stop Nexium on 04/29/2020 for 2 weeks, Then submit stool study for H.plyori.   You may resume Nexium after turning in stool studies.   Follow-up in 3 months. Office to schedule at a later time.  If you are age 70 or older, your body mass index should be between 23-30. Your Body mass index is 28.29 kg/m. If this is out of the aforementioned range listed, please consider follow up with your Primary Care Provider.  If you are age 46 or younger, your body mass index should be between 19-25. Your Body mass index is 28.29 kg/m. If this is out of the aformentioned range listed, please consider follow up with your Primary Care Provider.    Thank you for choosing me and  Gastroenterology.  Dr. Meridee Score

## 2020-04-22 ENCOUNTER — Encounter: Payer: Self-pay | Admitting: Gastroenterology

## 2020-04-22 DIAGNOSIS — K449 Diaphragmatic hernia without obstruction or gangrene: Secondary | ICD-10-CM | POA: Insufficient documentation

## 2020-04-22 NOTE — Progress Notes (Signed)
GASTROENTEROLOGY OUTPATIENT CLINIC VISIT   Primary Care Provider Bradd Canary, MD [address] [place] [address] [phone]  Patient Profile: Victoria Walker is a 70 y.o. female with a pmh significant for Diabetes, hypertension, hyperlipidemia, hypothyroidism, prior breast cancer, sciatica, GERD, hiatal hernia, colon polyps (TAs), H. pylori infection (awaiting eradication follow-up).  The patient presents to the [facility] for an evaluation and management of problem(s) noted below:  Problem List 1. Esophageal dysphagia   2. Hiatal hernia   3. History of Helicobacter infection   4. Hx of adenomatous colonic polyps     History of Present Illness Please see initial consultation note for full details of HPI.  Interval History The patient returns for a scheduled follow-up.  Since the patient's upper and lower endoscopy in October 2021, the patient states that she is doing very well.  We did an empiric dilation and her dysphagia symptoms have completely abated.  The patient was also found to have H. pylori infection and some mild symptoms which were uncovered during last evaluation seem to have improved.  H pylori eradication stool testing has not been performed as of yet.  She remains on PPI therapy.  This is helped with her pyrosis.  Patient was found to have multiple adenomatous colon polyps which were removed as well.  She continues to take her probiotic daily.  GI Review of Systems Positive as above Negative for odynophagia, vomiting, nausea, pain, change in bowel habits, melena, hematochezia  Review of Systems General: Denies fevers/chills/weight loss unintentionally Cardiovascular: Denies chest pain Pulmonary: Denies shortness of breath/nocturnal cough Gastroenterological: See HPI Genitourinary: Denies darkened urine Hematological: Denies easy bruising/bleeding Dermatological: Denies jaundice Psychological: Mood is  stable   Medications Current Outpatient Medications  Medication Sig Dispense Refill  . aspirin 81 MG EC tablet Take 81 mg by mouth every other day. Swallow whole.    Marland Kitchen atorvastatin (LIPITOR) 40 MG tablet TAKE 1 TABLET BY MOUTH EVERY OTHER DAY, ALTERNATING WITH 1/2 TABLET 180 tablet 1  . AVAPRO 150 MG tablet TAKE 1/2 TO 1 TABLET BY MOUTH DAILY 90 tablet 1  . calcium-vitamin D (OSCAL WITH D) 250-125 MG-UNIT per tablet Take 1 tablet by mouth daily.     . Cholecalciferol (VITAMIN D) 2000 UNITS CAPS Take 1 capsule by mouth every evening.     . Ciclopirox 1 % shampoo Apply 1 each topically every Friday.     . cycloSPORINE (RESTASIS) 0.05 % ophthalmic emulsion Place 1 drop into both eyes 2 (two) times daily.    Marland Kitchen diltiazem (TIAZAC) 360 MG 24 hr capsule Take 1 capsule (360 mg total) by mouth daily. 90 capsule 1  . DOCOSAHEXAENOIC ACID PO Take by mouth.    . Estradiol (YUVAFEM) 10 MCG TABS vaginal tablet INSERT 1 TAB VAGINALLY AT BEDTIME EVERY OTHER DAY 16 tablet 12  . gabapentin (NEURONTIN) 300 MG capsule Take 1 capsule (300 mg total) by mouth at bedtime. 90 capsule 1  . glucose blood (ONE TOUCH TEST STRIPS) test strip Use as directed twice daily to check blood sugar. DX code E11.9 300 each 1  . levocetirizine (XYZAL) 5 MG tablet Take 1 tablet (5 mg total) by mouth every evening. 90 tablet 3  . levothyroxine (SYNTHROID) 88 MCG tablet 1 tab po daily except skip Sunday 90 tablet 3  . loteprednol (LOTEMAX) 0.5 % ophthalmic suspension Place 1 drop into the left eye daily as needed ("arthiritis of the eye").     Marland Kitchen  metFORMIN (GLUCOPHAGE XR) 750 MG 24 hr tablet Take 1 tablet (750 mg total) by mouth in the morning and at bedtime. 180 tablet 1  . metoprolol succinate (TOPROL-XL) 25 MG 24 hr tablet TAKE 1 TABLET BY MOUTH TWICE A DAY AT 12 NOON AND 4PM 180 tablet 1  . mometasone (NASONEX) 50 MCG/ACT nasal spray USE ONE SPRAY IN EACH NOSTRIL TWICE DAILY IF NEEDED FOR STUFFY NOSE 51 g 3  . NEXIUM 40 MG capsule  Take 1 capsule (40 mg total) by mouth daily. 90 capsule 1  . polyvinyl alcohol-povidone (HYPOTEARS) 1.4-0.6 % ophthalmic solution Place 1 drop into both eyes 2 (two) times daily.    . Probiotic Product (PROBIOTIC DAILY PO) Take 1 capsule by mouth daily.    Marland Kitchen spironolactone (ALDACTONE) 25 MG tablet Take 1.5 tablets (37.5 mg total) by mouth daily. 135 tablet 1   No current facility-administered medications for this visit.    Allergies Allergies  Allergen Reactions  . Codeine Anxiety    Jittery   . Penicillins Itching  . Sulfa Antibiotics Itching  . Vicodin [Hydrocodone-Acetaminophen] Itching    ITCHING     Histories Past Medical History:  Diagnosis Date  . Arthritis    "both knees" (07/02/2015)  . Cancer of right breast ([place])   . Claustrophobia   . Dysrhythmia    HX OF FAST HEART RATE AND PALPITATIONS - METOPROLOL HAS HELPED  . GERD (gastroesophageal reflux disease)   . H/O hiatal hernia   . H/O iritis    LEFT EYE - STATES HER EYE BECOMES RED AND VERY SENSITIVE TO LIGHT WHEN FLARE UP OF IRITIS  . Heart murmur    "benign; 06/2015"  . High cholesterol   . History of blood transfusion    "when I had my partial hysterectomy"  . History of chickenpox 09/26/2014  . History of measles   . History of MRSA infection   . History of shingles   . Hypertension   . Hypothyroidism   . Medicare annual wellness visit, initial 04/07/2015  . Multinodular thyroid    PT HAVING TROUBLE SWALLOWING  . Neuropathy    FEET  . Primary localized osteoarthritis of right knee   . Sciatica of right side 04/25/2015  . Spinal headache    spinal headache with epidural  . Total knee replacement status, right   . Type II diabetes mellitus ([facility])    ORAL MEDICATION - NO INSULIN  . Wears glasses    Past Surgical History:  Procedure Laterality Date  . ABDOMINAL HYSTERECTOMY  1988  . ABDOMINOPLASTY  1986  . ANTERIOR AND POSTERIOR REPAIR  05/11/2012   Procedure: ANTERIOR (CYSTOCELE) AND POSTERIOR REPAIR  (RECTOCELE);  Surgeon: Reece Packer, MD;  Location: [facility];  Service: Urology;;  with graft  . BREAST BIOPSY Right   . CARDIAC CATHETERIZATION  1999  . COLONOSCOPY    . CYSTOSCOPY WITH URETHRAL DILATATION  05/11/2012   Procedure: CYSTOSCOPY WITH URETHRAL DILATATION;  Surgeon: Reece Packer, MD;  Location: [facility];  Service: Urology;;  . ESOPHAGOGASTRODUODENOSCOPY    . HERNIA REPAIR    . MASTECTOMY Right 2007  . PLANTAR FASCIA SURGERY Left   . THYROIDECTOMY N/A 02/23/2014   Procedure: TOTAL THYROIDECTOMY;  Surgeon: Armandina Gemma, MD;  Location: [facility];  Service: General;  Laterality: N/A;  . TOTAL KNEE ARTHROPLASTY Right 07/02/2015  . TOTAL KNEE ARTHROPLASTY Right 07/02/2015   Procedure: TOTAL KNEE ARTHROPLASTY;  Surgeon: Elsie Saas, MD;  Location: [facility];  Service: Orthopedics;  Laterality: Right;  . TOTAL THYROIDECTOMY  1976   removed three tumor (2 on right; 1 on left)  . TUBAL LIGATION    . UMBILICAL HERNIA REPAIR  1986  . VAGINAL PROLAPSE REPAIR  05/11/2012   Procedure: VAGINAL VAULT SUSPENSION;  Surgeon: Reece Packer, MD;  Location: [facility];  Service: Urology;;  . Clayborne Dana / PERINEUM BIOPSY     vulvar   Social History   Socioeconomic History  . Marital status: Married    Spouse name: Not on file  . Number of children: 3  . Years of education: Not on file  . Highest education level: Not on file  Occupational History    Comment: Retired  Tobacco Use  . Smoking status: Never Smoker  . Smokeless tobacco: Never Used  Vaping Use  . Vaping Use: Never used  Substance and Sexual Activity  . Alcohol use: No    Alcohol/week: 0.0 standard drinks  . Drug use: No  . Sexual activity: Not Currently    Birth control/protection: Surgical    Comment: lives with husband, retired from various jobs, diabetic diet  Other Topics Concern  . Not on file  Social History Narrative  . Not on file   Social Determinants of Health   Financial Resource Strain: Not on file  Food  Insecurity: Not on file  Transportation Needs: Not on file  Physical Activity: Not on file  Stress: Not on file  Social Connections: Not on file  Intimate Partner Violence: Not on file   Family History  Problem Relation Age of Onset  . Hypertension Mother   . Heart disease Mother        pacer, CHF  . Hyperlipidemia Mother   . Kidney disease Mother        1 kidney due to stone  . Kidney Stones Mother   . Hypertension Father   . Diabetes Father   . Alcohol abuse Father   . Lung cancer Father        lung, psa  . Diabetes Sister   . Hypertension Sister   . Breast cancer Sister   . Diabetes Brother   . Hypertension Brother   . Hyperlipidemia Brother   . Kidney disease Brother        dialysis  . Stroke Brother   . Hypertension Maternal Grandmother   . Heart disease Maternal Grandmother   . Stroke Maternal Grandmother   . Hypertension Maternal Grandfather   . Hypertension Daughter   . Arthritis Son   . Hyperlipidemia Sister   . Hypertension Sister   . Diabetes Sister   . Thyroid disease Sister   . Kidney disease Sister   . Endometriosis Sister   . Breast cancer Sister        thyroid and breast  . Thyroid cancer Sister   . Arthritis Sister        back pain  . Hypertension Sister   . Thyroid disease Sister   . Hypertension Brother   . Hyperlipidemia Brother   . Hypertension Brother   . Hyperlipidemia Brother   . Benign prostatic hyperplasia Brother   . Hypertension Daughter   . Hyperlipidemia Daughter   . Diabetes Daughter   . Hypertension Maternal Aunt   . Hypertension Maternal Uncle   . Hypertension Paternal Aunt   . Cancer Paternal Aunt   . Hypertension Paternal Uncle   . Colon cancer Neg Hx   . Stomach cancer Neg Hx   . Esophageal  cancer Neg Hx   . Pancreatic cancer Neg Hx   . Liver disease Neg Hx   . Rectal cancer Neg Hx   . Inflammatory bowel disease Neg Hx    I have reviewed her medical, social, and family history in detail and updated the  electronic medical record as necessary.    PHYSICAL EXAMINATION  BP (!) 98/58   Pulse 74   Ht 5\' 5"  (1.651 m)   Wt 170 lb (77.1 kg)   BMI 28.29 kg/m  Wt Readings from Last 3 Encounters:  04/19/20 170 lb (77.1 kg)  02/14/20 174 lb (78.9 kg)  12/15/19 174 lb (78.9 kg)  GEN: NAD, appears stated age, doesn't appear chronically ill PSYCH: Cooperative, without pressured speech EYE: Conjunctivae pink, sclerae anicteric ENT: Masked CV: Nontachycardic RESP: No audible wheezing GI: NABS, soft, rounded, NT/ND, without rebound or guarding MSK/EXT: No significant lower extremity edema SKIN: No jaundice NEURO:  Alert & Oriented x 3, no focal deficits   REVIEW OF DATA  I reviewed the following data at the time of this encounter:  GI Procedures and Studies  October 2021 EGD - No gross lesions in esophagus. Biopsied. Dilated. - 4 cm hiatal hernia. - Gastritis. No other gross lesions in the stomach. Biopsied. - No gross lesions in the duodenal bulb, in the first portion of the duodenum and in the second portion of the duodenum.  October 2021 colonoscopy - Hemorrhoids found on digital rectal exam. - Stool in the entire examined colon. - Seven 3 to 8 mm polyps in the rectum, in the descending colon, in the transverse colon, at the hepatic flexure and in the cecum, removed with a cold snare. Resected and retrieved. - Diverticulosis in the recto-sigmoid colon and in the sigmoid colon. - Normal mucosa in the entire examined colon otherwise. - Non-bleeding non-thrombosed external and internal hemorrhoids.  Pathology Diagnosis 1. Surgical [P], random gastric - GASTRIC ANTRAL AND OXYNTIC MUCOSA WITH HELICOBACTER PYLORI-ASSOCIATED GASTRITIS (CONFIRMED WITH WARTHIN STARRY STAIN) 2. Surgical [P], random esophagus - ESOPHAGEAL SQUAMOUS MUCOSA WITH MILD VASCULAR CONGESTION, AND FOCAL SQUAMOUS BALLOONING, SUGGESTIVE OF REFLUX ESOPHAGITIS - NEGATIVE FOR INCREASED INTRAEPITHELIAL EOSINOPHILS 3.  Surgical [P], colon, cecum, hepatic flexure, transverse, descending and rectum, polyp (7) - TUBULAR ADENOMA(S) WITHOUT HIGH-GRADE DYSPLASIA OR MALIGNANCY - HYPERPLASTIC POLYP(S)  Laboratory Studies  Reviewed those in epic and care everywhere  Imaging Studies  No relevant studies to review   ASSESSMENT  Ms. Bourgoyne is a 70 y.o. female with a pmh significant for Diabetes, hypertension, hyperlipidemia, hypothyroidism, prior breast cancer, sciatica, GERD, hiatal hernia, colon polyps (TAs), H. pylori infection (awaiting eradication follow-up).  The patient is seen today for evaluation and management of:  1. Esophageal dysphagia   2. Hiatal hernia   3. History of Helicobacter infection   4. Hx of adenomatous colonic polyps    The patient is clinically and hemodynamically well/stable.  At this time, we will have the patient continue her PPI therapy until 9 January.  She will stop it for 1 week.  She will then have H. pylori stool antigen testing performed and brought back to the laboratory for evaluation to ensure eradication of infection.  She will restart PPI at that time.  Eventually we will try to decrease her PPI dosing from 40 mg down to 20 mg but we will wait until we show that eradication has occurred.  We will see how she does in regards to further improvement of her dysphagia which seems to be doing well  currently.  Further dilation can be considered in the future if she does well.  We are holding off on esophageal manometry testing for now.  She is now up-to-date from a colon cancer screening perspective and colon polyp surveillance and will require a repeat colonoscopy in 3 years.  We will see her in follow-up in the coming months and if she is doing well then start to down titrate her PPI therapy.  All patient questions were answered to the best of my ability, and the patient agrees to the aforementioned plan of action with follow-up as indicated.     PLAN  Continue current Nexium 40 mg  daily until January 9 and then she will stop that for 1 week and then provide an H. pylori stool antigen and then restart PPI therapy thereafter Repeat dilation as needed if she continues to do well Hold on esophageal manometry for now Surveillance colonoscopy in 2024   Orders Placed This Encounter  Procedures  . Helicobacter pylori special antigen    New Prescriptions   No medications on file   Modified Medications   No medications on file    Planned Follow Up Return in about 3 months (around 07/18/2020).   Total Time in Face-to-Face and in Coordination of Care for patient including independent/personal interpretation/review of prior testing, medical history, examination, medication adjustment, communicating results with the patient directly, and documentation with the EHR is 25 minutes.  Justice Britain, MD [facility] # [phone]

## 2020-04-23 ENCOUNTER — Ambulatory Visit: Payer: Medicare Other | Admitting: Family Medicine

## 2020-04-23 DIAGNOSIS — E1140 Type 2 diabetes mellitus with diabetic neuropathy, unspecified: Secondary | ICD-10-CM | POA: Diagnosis not present

## 2020-04-24 ENCOUNTER — Ambulatory Visit: Payer: Medicare Other | Admitting: Medical

## 2020-04-24 DIAGNOSIS — E1140 Type 2 diabetes mellitus with diabetic neuropathy, unspecified: Secondary | ICD-10-CM | POA: Diagnosis not present

## 2020-05-14 ENCOUNTER — Other Ambulatory Visit (HOSPITAL_BASED_OUTPATIENT_CLINIC_OR_DEPARTMENT_OTHER): Payer: Self-pay | Admitting: Ophthalmology

## 2020-05-15 ENCOUNTER — Other Ambulatory Visit (HOSPITAL_BASED_OUTPATIENT_CLINIC_OR_DEPARTMENT_OTHER): Payer: Self-pay | Admitting: Ophthalmology

## 2020-05-15 ENCOUNTER — Other Ambulatory Visit: Payer: Medicare Other

## 2020-05-15 DIAGNOSIS — Z8619 Personal history of other infectious and parasitic diseases: Secondary | ICD-10-CM | POA: Diagnosis not present

## 2020-05-15 DIAGNOSIS — R1319 Other dysphagia: Secondary | ICD-10-CM | POA: Diagnosis not present

## 2020-05-15 MED FILL — RESTASIS 0.05% EYE EMULSION: 0.05 | 90 days supply | Qty: 180 | Fill #0

## 2020-05-15 MED FILL — FLUCONAZOLE 150 MG TABS: 150 | 3 days supply | Qty: 2 | Fill #0

## 2020-05-15 MED FILL — LOTEMAX 0.5% EYE DROPS: 0.5 | 10 days supply | Qty: 5 | Fill #0

## 2020-05-15 MED FILL — CLINDAMYCIN HCL 300 MG CAP: 300 | 3 days supply | Qty: 6 | Fill #0

## 2020-05-17 LAB — HELICOBACTER PYLORI  SPECIAL ANTIGEN
MICRO NUMBER:: 11454333
SPECIMEN QUALITY: ADEQUATE

## 2020-05-21 ENCOUNTER — Other Ambulatory Visit: Payer: Self-pay | Admitting: Family Medicine

## 2020-05-21 DIAGNOSIS — I10 Essential (primary) hypertension: Secondary | ICD-10-CM

## 2020-05-21 MED FILL — LEVOTHYROXINE SODIUM 88 MCG: 88 | 90 days supply | Qty: 77 | Fill #0

## 2020-05-21 MED FILL — MOMETASONE FUROATE 50 MCG S: 50 | 90 days supply | Qty: 51 | Fill #3

## 2020-05-21 MED FILL — AVAPRO 150 MG TABLET: 150 | 30 days supply | Qty: 30 | Fill #0

## 2020-05-21 MED FILL — METOPROLOL SUCCINATE ER 25: 25 | 90 days supply | Qty: 180 | Fill #0

## 2020-05-21 MED FILL — DILTIAZEM 24HR ER 360 MG CA: 360 | 90 days supply | Qty: 90 | Fill #1

## 2020-05-25 ENCOUNTER — Other Ambulatory Visit: Payer: Self-pay

## 2020-05-28 ENCOUNTER — Ambulatory Visit (INDEPENDENT_AMBULATORY_CARE_PROVIDER_SITE_OTHER): Payer: Medicare Other | Admitting: Family Medicine

## 2020-05-28 ENCOUNTER — Encounter: Payer: Self-pay | Admitting: Family Medicine

## 2020-05-28 ENCOUNTER — Other Ambulatory Visit: Payer: Self-pay

## 2020-05-28 ENCOUNTER — Other Ambulatory Visit: Payer: Self-pay | Admitting: Family Medicine

## 2020-05-28 VITALS — BP 122/74 | HR 74 | Temp 97.9°F | Resp 16 | Ht 65.0 in | Wt 166.4 lb

## 2020-05-28 DIAGNOSIS — M216X9 Other acquired deformities of unspecified foot: Secondary | ICD-10-CM

## 2020-05-28 DIAGNOSIS — E1140 Type 2 diabetes mellitus with diabetic neuropathy, unspecified: Secondary | ICD-10-CM

## 2020-05-28 DIAGNOSIS — E782 Mixed hyperlipidemia: Secondary | ICD-10-CM

## 2020-05-28 DIAGNOSIS — E039 Hypothyroidism, unspecified: Secondary | ICD-10-CM | POA: Diagnosis not present

## 2020-05-28 DIAGNOSIS — R739 Hyperglycemia, unspecified: Secondary | ICD-10-CM

## 2020-05-28 DIAGNOSIS — I10 Essential (primary) hypertension: Secondary | ICD-10-CM

## 2020-05-28 DIAGNOSIS — Z0000 Encounter for general adult medical examination without abnormal findings: Secondary | ICD-10-CM | POA: Diagnosis not present

## 2020-05-28 DIAGNOSIS — G8929 Other chronic pain: Secondary | ICD-10-CM | POA: Diagnosis not present

## 2020-05-28 DIAGNOSIS — M25562 Pain in left knee: Secondary | ICD-10-CM | POA: Diagnosis not present

## 2020-05-28 DIAGNOSIS — R1319 Other dysphagia: Secondary | ICD-10-CM

## 2020-05-28 LAB — CBC WITH DIFFERENTIAL/PLATELET
Basophils Absolute: 0 10*3/uL (ref 0.0–0.1)
Basophils Relative: 0.6 % (ref 0.0–3.0)
Eosinophils Absolute: 0.1 10*3/uL (ref 0.0–0.7)
Eosinophils Relative: 1.1 % (ref 0.0–5.0)
HCT: 41.8 % (ref 36.0–46.0)
Hemoglobin: 14.1 g/dL (ref 12.0–15.0)
Lymphocytes Relative: 43.1 % (ref 12.0–46.0)
Lymphs Abs: 2.8 10*3/uL (ref 0.7–4.0)
MCHC: 33.7 g/dL (ref 30.0–36.0)
MCV: 97.2 fl (ref 78.0–100.0)
Monocytes Absolute: 0.4 10*3/uL (ref 0.1–1.0)
Monocytes Relative: 6.8 % (ref 3.0–12.0)
Neutro Abs: 3.2 10*3/uL (ref 1.4–7.7)
Neutrophils Relative %: 48.4 % (ref 43.0–77.0)
Platelets: 264 10*3/uL (ref 150.0–400.0)
RBC: 4.3 Mil/uL (ref 3.87–5.11)
RDW: 13.7 % (ref 11.5–15.5)
WBC: 6.6 10*3/uL (ref 4.0–10.5)

## 2020-05-28 LAB — HEMOGLOBIN A1C: Hgb A1c MFr Bld: 6.7 % — ABNORMAL HIGH (ref 4.6–6.5)

## 2020-05-28 LAB — COMPREHENSIVE METABOLIC PANEL
ALT: 18 U/L (ref 0–35)
AST: 15 U/L (ref 0–37)
Albumin: 4.6 g/dL (ref 3.5–5.2)
Alkaline Phosphatase: 49 U/L (ref 39–117)
BUN: 15 mg/dL (ref 6–23)
CO2: 33 mEq/L — ABNORMAL HIGH (ref 19–32)
Calcium: 10 mg/dL (ref 8.4–10.5)
Chloride: 101 mEq/L (ref 96–112)
Creatinine, Ser: 0.85 mg/dL (ref 0.40–1.20)
GFR: 69.37 mL/min (ref >60.00)
Glucose, Bld: 94 mg/dL (ref 70–99)
Potassium: 4.9 mEq/L (ref 3.5–5.1)
Sodium: 138 mEq/L (ref 135–145)
Total Bilirubin: 0.5 mg/dL (ref 0.2–1.2)
Total Protein: 6.7 g/dL (ref 6.0–8.3)

## 2020-05-28 LAB — LIPID PANEL
Cholesterol: 168 mg/dL (ref 0–200)
HDL: 51.5 mg/dL (ref >39.00)
LDL Cholesterol: 91 mg/dL (ref 0–99)
NonHDL: 116.79
Total CHOL/HDL Ratio: 3
Triglycerides: 129 mg/dL (ref 0.0–149.0)
VLDL: 25.8 mg/dL (ref 0.0–40.0)

## 2020-05-28 LAB — TSH: TSH: 0.47 u[IU]/mL (ref 0.35–4.50)

## 2020-05-28 MED ORDER — IRBESARTAN 75 MG PO TABS: 75.0000 mg | ORAL_TABLET | Freq: Every day | ORAL | 1 refills | Status: DC

## 2020-05-28 MED FILL — AVAPRO 75 MG TABS: 75 | 90 days supply | Qty: 90 | Fill #0

## 2020-05-28 NOTE — Assessment & Plan Note (Signed)
Patient reports it is greatly improved since treatment of H Pylori and stretching. She will continue to follow with GI

## 2020-05-28 NOTE — Assessment & Plan Note (Addendum)
Patient encouraged to maintain heart healthy diet, regular exercise, adequate sleep. Consider daily probiotics. Take medications as prescribed. Patient s/p hysterectomy sees OB/GYN Dr Ihor Dow. dexa scan was osteopenia, December 2020. Repeat at 3-5 year mark. Colonoscopy October 2021 showed numerous polyps, repeat in 2024. labs ordered and reviewed

## 2020-05-28 NOTE — Assessment & Plan Note (Signed)
Encouraged heart healthy diet, increase exercise, avoid trans fats, consider a krill oil cap daily 

## 2020-05-28 NOTE — Patient Instructions (Signed)
Bone density shows osteopenia, which is thinner than normal but not as bad as osteoporosis. Recommend calcium intake of 1200 to 1500 mg daily, divided into roughly 3 doses. Best source is the diet and a single dairy serving is about 500 mg, a supplement of calcium citrate once or twice daily to balance diet is fine if not getting enough in diet. Also need Vitamin D 2000 IU caps, 1 cap daily if not already taking vitamin D. Also recommend weight baring exercise on hips and upper body to keep bones strong    Preventive Care 65 Years and Older, Female Preventive care refers to lifestyle choices and visits with your health care provider that can promote health and wellness. This includes:  A yearly physical exam. This is also called an annual wellness visit.  Regular dental and eye exams.  Immunizations.  Screening for certain conditions.  Healthy lifestyle choices, such as: ? Eating a healthy diet. ? Getting regular exercise. ? Not using drugs or products that contain nicotine and tobacco. ? Limiting alcohol use. What can I expect for my preventive care visit? Physical exam Your health care provider will check your:  Height and weight. These may be used to calculate your BMI (body mass index). BMI is a measurement that tells if you are at a healthy weight.  Heart rate and blood pressure.  Body temperature.  Skin for abnormal spots. Counseling Your health care provider may ask you questions about your:  Past medical problems.  Family's medical history.  Alcohol, tobacco, and drug use.  Emotional well-being.  Home life and relationship well-being.  Sexual activity.  Diet, exercise, and sleep habits.  History of falls.  Memory and ability to understand (cognition).  Work and work environment.  Pregnancy and menstrual history.  Access to firearms. What immunizations do I need? Vaccines are usually given at various ages, according to a schedule. Your health care  provider will recommend vaccines for you based on your age, medical history, and lifestyle or other factors, such as travel or where you work.   What tests do I need? Blood tests  Lipid and cholesterol levels. These may be checked every 5 years, or more often depending on your overall health.  Hepatitis C test.  Hepatitis B test. Screening  Lung cancer screening. You may have this screening every year starting at age 55 if you have a 30-pack-year history of smoking and currently smoke or have quit within the past 15 years.  Colorectal cancer screening. ? All adults should have this screening starting at age 50 and continuing until age 75. ? Your health care provider may recommend screening at age 45 if you are at increased risk. ? You will have tests every 1-10 years, depending on your results and the type of screening test.  Diabetes screening. ? This is done by checking your blood sugar (glucose) after you have not eaten for a while (fasting). ? You may have this done every 1-3 years.  Mammogram. ? This may be done every 1-2 years. ? Talk with your health care provider about how often you should have regular mammograms.  Abdominal aortic aneurysm (AAA) screening. You may need this if you are a current or former smoker.  BRCA-related cancer screening. This may be done if you have a family history of breast, ovarian, tubal, or peritoneal cancers. Other tests  STD (sexually transmitted disease) testing, if you are at risk.  Bone density scan. This is done to screen for osteoporosis. You may   have this done starting at age 65. Talk with your health care provider about your test results, treatment options, and if necessary, the need for more tests. Follow these instructions at home: Eating and drinking  Eat a diet that includes fresh fruits and vegetables, whole grains, lean protein, and low-fat dairy products. Limit your intake of foods with high amounts of sugar, saturated fats,  and salt.  Take vitamin and mineral supplements as recommended by your health care provider.  Do not drink alcohol if your health care provider tells you not to drink.  If you drink alcohol: ? Limit how much you have to 0-1 drink a day. ? Be aware of how much alcohol is in your drink. In the [place] one drink equals one 12 oz bottle of beer (355 mL), one 5 oz glass of wine (148 mL), or one 1 oz glass of hard liquor (44 mL).   Lifestyle  Take daily care of your teeth and gums. Brush your teeth every morning and night with fluoride toothpaste. Floss one time each day.  Stay active. Exercise for at least 30 minutes 5 or more days each week.  Do not use any products that contain nicotine or tobacco, such as cigarettes, e-cigarettes, and chewing tobacco. If you need help quitting, ask your health care provider.  Do not use drugs.  If you are sexually active, practice safe sex. Use a condom or other form of protection in order to prevent STIs (sexually transmitted infections).  Talk with your health care provider about taking a low-dose aspirin or statin.  Find healthy ways to cope with stress, such as: ? Meditation, yoga, or listening to music. ? Journaling. ? Talking to a trusted person. ? Spending time with friends and family. Safety  Always wear your seat belt while driving or riding in a vehicle.  Do not drive: ? If you have been drinking alcohol. Do not ride with someone who has been drinking. ? When you are tired or distracted. ? While texting.  Wear a helmet and other protective equipment during sports activities.  If you have firearms in your house, make sure you follow all gun safety procedures. What's next?  Visit your health care provider once a year for an annual wellness visit.  Ask your health care provider how often you should have your eyes and teeth checked.  Stay up to date on all vaccines. This information is not intended to replace advice given to you by  your health care provider. Make sure you discuss any questions you have with your health care provider. Document Revised: 03/28/2020 Document Reviewed: 04/01/2018 Elsevier Patient Education  2021 [employer].  

## 2020-05-28 NOTE — Assessment & Plan Note (Signed)
hgba1c acceptable, minimize simple carbs. Increase exercise as tolerated. Continue current meds 

## 2020-05-28 NOTE — Assessment & Plan Note (Signed)
She has seen podiatry and gotten inserts for her shoes which are helpful

## 2020-05-28 NOTE — Assessment & Plan Note (Signed)
Continues to worsen, she is considering having surgery but is waiting as her husband recovers from his own health concerns.

## 2020-05-28 NOTE — Assessment & Plan Note (Signed)
Well controlled, no changes to meds. Encouraged heart healthy diet such as the DASH diet and exercise as tolerated.  °

## 2020-05-28 NOTE — Assessment & Plan Note (Signed)
On Levothyroxine, continue to monitor 

## 2020-05-28 NOTE — Progress Notes (Signed)
Subjective:    Patient ID: Victoria Walker, female    DOB: [birth year], 70 y.o.   MRN: [id number]  Chief Complaint  Patient presents with  . Annual Exam    HPI Patient is in today for annual preventative exam and follow up on chronic medical concerns. No recent febrile illness or hospitalizations. She is noting worsening left knee pain but no recent fall or trauma. She is considering a TKR but her husband's health has been poor so she has been putting it off. She notes her foot pain is better since seeing podiatry and getting orthotics. Also her dysphagia is much better since GI stretched her esophagus and stretched her esophagus. She is trying to eat better and stay active. Denies CP/palp/SOB/HA/congestion/fevers/GI or GU c/o. Taking meds as prescribed  Past Medical History:  Diagnosis Date  . Arthritis    "both knees" (07/02/2015)  . Cancer of right breast ([place])   . Claustrophobia   . Dysrhythmia    HX OF FAST HEART RATE AND PALPITATIONS - METOPROLOL HAS HELPED  . GERD (gastroesophageal reflux disease)   . H/O hiatal hernia   . H/O iritis    LEFT EYE - STATES HER EYE BECOMES RED AND VERY SENSITIVE TO LIGHT WHEN FLARE UP OF IRITIS  . Heart murmur    "benign; 06/2015"  . High cholesterol   . History of blood transfusion    "when I had my partial hysterectomy"  . History of chickenpox 09/26/2014  . History of measles   . History of MRSA infection   . History of shingles   . Hypertension   . Hypothyroidism   . Medicare annual wellness visit, initial 04/07/2015  . Multinodular thyroid    PT HAVING TROUBLE SWALLOWING  . Neuropathy    FEET  . Primary localized osteoarthritis of right knee   . Sciatica of right side 04/25/2015  . Spinal headache    spinal headache with epidural  . Total knee replacement status, right   . Type II diabetes mellitus (HCC)    ORAL MEDICATION - NO INSULIN  . Wears glasses     Past Surgical History:  Procedure Laterality Date  . ABDOMINAL  HYSTERECTOMY  1988  . ABDOMINOPLASTY  1986  . ANTERIOR AND POSTERIOR REPAIR  05/11/2012   Procedure: ANTERIOR (CYSTOCELE) AND POSTERIOR REPAIR (RECTOCELE);  Surgeon: Reece Packer, MD;  Location: [facility];  Service: Urology;;  with graft  . BREAST BIOPSY Right   . CARDIAC CATHETERIZATION  1999  . COLONOSCOPY    . CYSTOSCOPY WITH URETHRAL DILATATION  05/11/2012   Procedure: CYSTOSCOPY WITH URETHRAL DILATATION;  Surgeon: Reece Packer, MD;  Location: [facility];  Service: Urology;;  . ESOPHAGOGASTRODUODENOSCOPY    . HERNIA REPAIR    . MASTECTOMY Right 2007  . PLANTAR FASCIA SURGERY Left   . THYROIDECTOMY N/A 02/23/2014   Procedure: TOTAL THYROIDECTOMY;  Surgeon: Armandina Gemma, MD;  Location: [facility];  Service: General;  Laterality: N/A;  . TOTAL KNEE ARTHROPLASTY Right 07/02/2015  . TOTAL KNEE ARTHROPLASTY Right 07/02/2015   Procedure: TOTAL KNEE ARTHROPLASTY;  Surgeon: Elsie Saas, MD;  Location: [facility];  Service: Orthopedics;  Laterality: Right;  . TOTAL THYROIDECTOMY  1976   removed three tumor (2 on right; 1 on left)  . TUBAL LIGATION    . UMBILICAL HERNIA REPAIR  1986  . VAGINAL PROLAPSE REPAIR  05/11/2012   Procedure: VAGINAL VAULT SUSPENSION;  Surgeon: Reece Packer, MD;  Location: [facility];  Service:  Urology;;  . VULVA / PERINEUM BIOPSY     vulvar    Family History  Problem Relation Age of Onset  . Hypertension Mother   . Heart disease Mother        pacer, CHF  . Hyperlipidemia Mother   . Kidney disease Mother        1 kidney due to stone  . Kidney Stones Mother   . Hypertension Father   . Diabetes Father   . Alcohol abuse Father   . Lung cancer Father        lung, psa  . Diabetes Sister   . Hypertension Sister   . Breast cancer Sister   . Diabetes Brother   . Hypertension Brother   . Hyperlipidemia Brother   . Kidney disease Brother        dialysis  . Stroke Brother   . Hypertension Maternal Grandmother   . Heart disease Maternal Grandmother   . Stroke  Maternal Grandmother   . Hypertension Maternal Grandfather   . Hypertension Daughter   . Arthritis Son   . Hyperlipidemia Sister   . Hypertension Sister   . Diabetes Sister   . Thyroid disease Sister   . Kidney disease Sister   . Endometriosis Sister   . Breast cancer Sister        thyroid and breast  . Thyroid cancer Sister   . Arthritis Sister        back pain  . Hypertension Sister   . Thyroid disease Sister   . Hypertension Brother   . Hyperlipidemia Brother   . Hypertension Brother   . Hyperlipidemia Brother   . Benign prostatic hyperplasia Brother   . Hypertension Daughter   . Hyperlipidemia Daughter   . Diabetes Daughter   . Hypertension Maternal Aunt   . Hypertension Maternal Uncle   . Hypertension Paternal Aunt   . Cancer Paternal Aunt   . Hypertension Paternal Uncle   . Colon cancer Neg Hx   . Stomach cancer Neg Hx   . Esophageal cancer Neg Hx   . Pancreatic cancer Neg Hx   . Liver disease Neg Hx   . Rectal cancer Neg Hx   . Inflammatory bowel disease Neg Hx     Social History   Socioeconomic History  . Marital status: Married    Spouse name: Not on file  . Number of children: 3  . Years of education: Not on file  . Highest education level: Not on file  Occupational History    Comment: Retired  Tobacco Use  . Smoking status: Never Smoker  . Smokeless tobacco: Never Used  Vaping Use  . Vaping Use: Never used  Substance and Sexual Activity  . Alcohol use: No    Alcohol/week: 0.0 standard drinks  . Drug use: No  . Sexual activity: Not Currently    Birth control/protection: Surgical    Comment: lives with husband, retired from various jobs, diabetic diet  Other Topics Concern  . Not on file  Social History Narrative  . Not on file   Social Determinants of Health   Financial Resource Strain: Not on file  Food Insecurity: Not on file  Transportation Needs: Not on file  Physical Activity: Not on file  Stress: Not on file  Social Connections:  Not on file  Intimate Partner Violence: Not on file    Outpatient Medications Prior to Visit  Medication Sig Dispense Refill  . aspirin 81 MG EC tablet Take 81 mg by  mouth every other day. Swallow whole.    Marland Kitchen atorvastatin (LIPITOR) 40 MG tablet TAKE 1 TABLET BY MOUTH EVERY OTHER DAY, ALTERNATING WITH 1/2 TABLET 180 tablet 1  . calcium-vitamin D (OSCAL WITH D) 250-125 MG-UNIT per tablet Take 1 tablet by mouth daily.     . Cholecalciferol (VITAMIN D) 2000 UNITS CAPS Take 1 capsule by mouth every evening.     . Ciclopirox 1 % shampoo Apply 1 each topically every Friday.     . cycloSPORINE (RESTASIS) 0.05 % ophthalmic emulsion Place 1 drop into both eyes 2 (two) times daily.    Marland Kitchen diltiazem (TIAZAC) 360 MG 24 hr capsule Take 1 capsule (360 mg total) by mouth daily. 90 capsule 1  . DOCOSAHEXAENOIC ACID PO Take by mouth.    . Estradiol (YUVAFEM) 10 MCG TABS vaginal tablet INSERT 1 TAB VAGINALLY AT BEDTIME EVERY OTHER DAY 16 tablet 12  . gabapentin (NEURONTIN) 300 MG capsule Take 1 capsule (300 mg total) by mouth at bedtime. 90 capsule 1  . glucose blood (ONE TOUCH TEST STRIPS) test strip Use as directed twice daily to check blood sugar. DX code E11.9 300 each 1  . levocetirizine (XYZAL) 5 MG tablet Take 1 tablet (5 mg total) by mouth every evening. 90 tablet 3  . levothyroxine (SYNTHROID) 88 MCG tablet TAKE 1 TABLET BY MOUTH DAILY. SKIP SUNDAY. 90 tablet 0  . loteprednol (LOTEMAX) 0.5 % ophthalmic suspension Place 1 drop into the left eye daily as needed ("arthiritis of the eye").     . metFORMIN (GLUCOPHAGE XR) 750 MG 24 hr tablet Take 1 tablet (750 mg total) by mouth in the morning and at bedtime. 180 tablet 1  . metoprolol succinate (TOPROL-XL) 25 MG 24 hr tablet Take 1 tablet (25 mg total) by mouth in the morning and at bedtime. 180 tablet 0  . mometasone (NASONEX) 50 MCG/ACT nasal spray USE ONE SPRAY IN EACH NOSTRIL TWICE DAILY IF NEEDED FOR STUFFY NOSE 51 g 3  . NEXIUM 40 MG capsule Take 1  capsule (40 mg total) by mouth daily. 90 capsule 1  . polyvinyl alcohol-povidone (HYPOTEARS) 1.4-0.6 % ophthalmic solution Place 1 drop into both eyes 2 (two) times daily.    . Probiotic Product (PROBIOTIC DAILY PO) Take 1 capsule by mouth daily.    Marland Kitchen spironolactone (ALDACTONE) 25 MG tablet Take 1.5 tablets (37.5 mg total) by mouth daily. 135 tablet 1  . AVAPRO 150 MG tablet Take 0.5-1 tablets (75-150 mg total) by mouth daily. 90 tablet 0   No facility-administered medications prior to visit.    Allergies  Allergen Reactions  . Codeine Anxiety    Jittery   . Penicillins Itching  . Sulfa Antibiotics Itching  . Vicodin [Hydrocodone-Acetaminophen] Itching    ITCHING     Review of Systems  Constitutional: Negative for chills, fever and malaise/fatigue.  HENT: Negative for congestion and hearing loss.   Eyes: Negative for discharge.  Respiratory: Negative for cough, sputum production and shortness of breath.   Cardiovascular: Negative for chest pain, palpitations and leg swelling.  Gastrointestinal: Negative for abdominal pain, blood in stool, constipation, diarrhea, heartburn, nausea and vomiting.  Genitourinary: Negative for dysuria, frequency, hematuria and urgency.  Musculoskeletal: Negative for back pain, falls and myalgias.  Skin: Negative for rash.  Neurological: Negative for dizziness, sensory change, loss of consciousness, weakness and headaches.  Endo/Heme/Allergies: Negative for environmental allergies. Does not bruise/bleed easily.  Psychiatric/Behavioral: Negative for depression and suicidal ideas. The patient is not nervous/anxious  and does not have insomnia.        Objective:    Physical Exam Constitutional:      General: She is not in acute distress.    Appearance: She is not diaphoretic.  HENT:     Head: Normocephalic and atraumatic.     Right Ear: External ear normal.     Left Ear: External ear normal.     Nose: Nose normal.     Mouth/Throat:     Mouth:  Oropharynx is clear and moist.     Pharynx: No oropharyngeal exudate.  Eyes:     General: No scleral icterus.       Right eye: No discharge.        Left eye: No discharge.     Conjunctiva/sclera: Conjunctivae normal.     Pupils: Pupils are equal, round, and reactive to light.  Neck:     Thyroid: No thyromegaly.  Cardiovascular:     Rate and Rhythm: Normal rate and regular rhythm.     Pulses: Intact distal pulses.     Heart sounds: Normal heart sounds. No murmur heard.   Pulmonary:     Effort: Pulmonary effort is normal. No respiratory distress.     Breath sounds: Normal breath sounds. No wheezing or rales.  Abdominal:     General: Bowel sounds are normal. There is no distension.     Palpations: Abdomen is soft. There is no mass.     Tenderness: There is no abdominal tenderness.  Musculoskeletal:        General: No tenderness or edema. Normal range of motion.     Cervical back: Normal range of motion and neck supple.  Lymphadenopathy:     Cervical: No cervical adenopathy.  Skin:    General: Skin is warm and dry.     Findings: No rash.  Neurological:     Mental Status: She is alert and oriented to person, place, and time.     Cranial Nerves: No cranial nerve deficit.     Coordination: Coordination normal.     Deep Tendon Reflexes: Reflexes are normal and symmetric. Reflexes normal.     BP 122/74   Pulse 74   Temp 97.9 F (36.6 C)   Resp 16   Ht 5\' 5"  (1.651 m)   Wt 166 lb 6.4 oz (75.5 kg)   SpO2 99%   BMI 27.69 kg/m  Wt Readings from Last 3 Encounters:  05/28/20 166 lb 6.4 oz (75.5 kg)  04/19/20 170 lb (77.1 kg)  02/14/20 174 lb (78.9 kg)    Diabetic Foot Exam - Simple   Simple Foot Form Visual Inspection No deformities, no ulcerations, no other skin breakdown bilaterally: Yes Sensation Testing Intact to touch and monofilament testing bilaterally: Yes Pulse Check Posterior Tibialis and Dorsalis pulse intact bilaterally: Yes Comments    Lab Results   Component Value Date   WBC 6.3 11/17/2019   HGB 13.3 11/17/2019   HCT 39.0 11/17/2019   PLT 242.0 11/17/2019   GLUCOSE 108 (H) 11/17/2019   CHOL 151 11/17/2019   TRIG 137.0 11/17/2019   HDL 42.90 11/17/2019   LDLDIRECT 107.0 07/01/2016   LDLCALC 81 11/17/2019   ALT 19 11/17/2019   AST 18 11/17/2019   NA 140 11/17/2019   K 4.7 11/17/2019   CL 104 11/17/2019   CREATININE 0.92 11/17/2019   BUN 9 11/17/2019   CO2 31 11/17/2019   TSH 0.75 11/17/2019   INR 1.01 06/21/2015   HGBA1C 6.5 11/17/2019  MICROALBUR <0.7 08/02/2018    Lab Results  Component Value Date   TSH 0.75 11/17/2019   Lab Results  Component Value Date   WBC 6.3 11/17/2019   HGB 13.3 11/17/2019   HCT 39.0 11/17/2019   MCV 97.3 11/17/2019   PLT 242.0 11/17/2019   Lab Results  Component Value Date   NA 140 11/17/2019   K 4.7 11/17/2019   CO2 31 11/17/2019   GLUCOSE 108 (H) 11/17/2019   BUN 9 11/17/2019   CREATININE 0.92 11/17/2019   BILITOT 0.5 11/17/2019   ALKPHOS 52 11/17/2019   AST 18 11/17/2019   ALT 19 11/17/2019   PROT 6.8 11/17/2019   ALBUMIN 4.4 11/17/2019   CALCIUM 9.5 11/17/2019   ANIONGAP 9 07/04/2015   GFR 73.03 11/17/2019   Lab Results  Component Value Date   CHOL 151 11/17/2019   Lab Results  Component Value Date   HDL 42.90 11/17/2019   Lab Results  Component Value Date   LDLCALC 81 11/17/2019   Lab Results  Component Value Date   TRIG 137.0 11/17/2019   Lab Results  Component Value Date   CHOLHDL 4 11/17/2019   Lab Results  Component Value Date   HGBA1C 6.5 11/17/2019       Assessment & Plan:   Problem List Items Addressed This Visit    Type 2 diabetes mellitus with diabetic neuropathy, without long-term current use of insulin (HCC)    hgba1c acceptable, minimize simple carbs. Increase exercise as tolerated. Continue current meds      Relevant Medications   irbesartan (AVAPRO) 75 MG tablet   Other Relevant Orders   Comprehensive metabolic panel    Essential hypertension    Well controlled, no changes to meds. Encouraged heart healthy diet such as the DASH diet and exercise as tolerated.       Relevant Medications   irbesartan (AVAPRO) 75 MG tablet   Other Relevant Orders   CBC with Differential/Platelet   Comprehensive metabolic panel   Hyperlipidemia, mixed    Encouraged heart healthy diet, increase exercise, avoid trans fats, consider a krill oil cap daily      Relevant Medications   irbesartan (AVAPRO) 75 MG tablet   Other Relevant Orders   Lipid panel   Hypothyroid    On Levothyroxine, continue to monitor      Relevant Orders   TSH   Pronation deformity of ankle, acquired    She has seen podiatry and gotten inserts for her shoes which are helpful      Preventative health care    Patient encouraged to maintain heart healthy diet, regular exercise, adequate sleep. Consider daily probiotics. Take medications as prescribed. Patient s/p hysterectomy sees OB/GYN Dr Ihor Dow. dexa scan was osteopenia, December 2020. Repeat at 3-5 year mark. Colonoscopy October 2021 showed numerous polyps, repeat in 2024. labs ordered and reviewed      Left knee pain    Continues to worsen, she is considering having surgery but is waiting as her husband recovers from his own health concerns.       Dysphagia    Patient reports it is greatly improved since treatment of H Pylori and stretching. She will continue to follow with GI       Other Visit Diagnoses    Hyperglycemia    -  Primary   Relevant Orders   Hemoglobin A1c      I have discontinued Joycelyn Schmid I. Searles's Avapro. I am also having her start on irbesartan.  Additionally, I am having her maintain her calcium-vitamin D, loteprednol, Ciclopirox, cycloSPORINE, polyvinyl alcohol-povidone, Vitamin D, Probiotic Product (PROBIOTIC DAILY PO), aspirin, DOCOSAHEXAENOIC ACID PO, glucose blood, Estradiol, mometasone, metFORMIN, diltiazem, NexIUM, atorvastatin, spironolactone,  levocetirizine, gabapentin, metoprolol succinate, and levothyroxine.  Meds ordered this encounter  Medications  . irbesartan (AVAPRO) 75 MG tablet    Sig: Take 1 tablet (75 mg total) by mouth daily.    Dispense:  90 tablet    Refill:  1     Penni Homans, MD

## 2020-06-11 NOTE — Patient Instructions (Addendum)
Allergic rhinitis Continue levocetirizine 5 mg once a day as needed for runny nose or itch. Remember to rotate to a different antihistamine about every 3 months. Some examples of over the counter antihistamines include Zyrtec (cetirizine), Xyzal (levocetirizine), [name] (fexofenadine), and Claritin (loratidine).  Continue Nasonex 1 spray in each nostril twice a day if needed for a stuffy nose.  In the right nostril, point the applicator out toward the right ear. In the left nostril, point the applicator out toward the left ear Continue saline nasal rinses as needed for nasal symptoms. Use this before any medicated nasal sprays for best result Consider nasal saline gel as needed for der or irritated nostrils Continue regular appointments with [facility] to assess glaucoma  Call the clinic if this treatment plan is not working well for you  Follow up in 1 year or sooner if needed.

## 2020-06-11 NOTE — Progress Notes (Signed)
[place] [address] Dept: [phone]  FOLLOW UP NOTE  Patient ID: Victoria Walker, female    DOB: 11-04-1950  Age: 70 y.o. MRN: [id number] Date of Office Visit: 06/12/2020  Assessment  Chief Complaint: Allergic Rhinitis  and Gastroesophageal Reflux  HPI Victoria Walker is a 70 year old female who presents the clinic for follow-up visit.  She was last seen in this clinic on 06/13/2019 by Dr. Shaune Leeks for evaluation of allergic rhinitis.  At today's visit she reports her rhinitis has been moderately well controlled with symptoms including nasal congestion for about a week, occasional clear rhinorrhea, and occasional postnasal drainage.  She reports that yesterday she began experiencing nasal discharge with a yellow color.  She denies sinus pressure, eye pain, fever, and sick contacts.  She continues levocetirizine 5 mg once a day, mometasone nasal spray daily with good application technique, and nasal saline rinse every other day.  She reports she has not used the nasal saline rinse in about 2 weeks.  Her last serum environmental allergen panel in 2013 was negative.  She reports she has recently started using Lotemax for symptoms of iritis for which she follows with [facility].  Her current medications are listed in the chart.   Drug Allergies:  Allergies  Allergen Reactions  . Codeine Anxiety    Jittery   . Penicillins Itching  . Sulfa Antibiotics Itching  . Vicodin [Hydrocodone-Acetaminophen] Itching    ITCHING     Physical Exam: BP 116/70 (BP Location: Left Arm, Patient Position: Sitting, Cuff Size: Normal)   Pulse 62   Temp 97.7 F (36.5 C) (Tympanic)   Resp 16   Ht 5\' 4"  (1.626 m)   Wt 165 lb 6.4 oz (75 kg)   SpO2 98%   BMI 28.39 kg/m    Physical Exam Vitals reviewed.  Constitutional:      Appearance: Normal appearance.  HENT:     Head: Normocephalic and atraumatic.     Right Ear: Tympanic membrane normal.     Left  Ear: Tympanic membrane normal.     Nose:     Comments: Lateral nares erythematous with clear nasal drainage noted.  Pharynx normal.  Ears normal.  Eyes normal.    Mouth/Throat:     Pharynx: Oropharynx is clear.  Eyes:     Conjunctiva/sclera: Conjunctivae normal.  Cardiovascular:     Rate and Rhythm: Normal rate and regular rhythm.     Heart sounds: Normal heart sounds. No murmur heard.   Pulmonary:     Effort: Pulmonary effort is normal.     Breath sounds: Normal breath sounds.     Comments: Lungs clear to auscultation Musculoskeletal:        General: Normal range of motion.     Cervical back: Normal range of motion and neck supple.  Skin:    General: Skin is warm and dry.  Neurological:     Mental Status: She is alert and oriented to person, place, and time.  Psychiatric:        Mood and Affect: Mood normal.        Behavior: Behavior normal.        Thought Content: Thought content normal.        Judgment: Judgment normal.     Assessment and Plan: 1. Other allergic rhinitis   2. Essential hypertension   3. Current use of beta blocker     Meds ordered this encounter  Medications  .  mometasone (NASONEX) 50 MCG/ACT nasal spray    Sig: USE ONE SPRAY IN EACH NOSTRIL TWICE DAILY IF NEEDED FOR STUFFY NOSE    Dispense:  51 g    Refill:  3    Patient Instructions  Allergic rhinitis Continue levocetirizine 5 mg once a day as needed for runny nose or itch. Remember to rotate to a different antihistamine about every 3 months. Some examples of over the counter antihistamines include Zyrtec (cetirizine), Xyzal (levocetirizine), [name] (fexofenadine), and Claritin (loratidine).  Continue Nasonex 1 spray in each nostril twice a day if needed for a stuffy nose.  In the right nostril, point the applicator out toward the right ear. In the left nostril, point the applicator out toward the left ear Continue saline nasal rinses as needed for nasal symptoms. Use this before any medicated  nasal sprays for best result Consider nasal saline gel as needed for der or irritated nostrils Continue regular appointments with [facility] to assess glaucoma  Call the clinic if this treatment plan is not working well for you  Follow up in 1 year or sooner if needed.    Return in about 1 year (around 06/12/2021), or if symptoms worsen or fail to improve.    Thank you for the opportunity to care for this patient.  Please do not hesitate to contact me with questions.  Gareth Morgan, FNP [facility] [employer] [facility]

## 2020-06-12 ENCOUNTER — Other Ambulatory Visit: Payer: Self-pay

## 2020-06-12 ENCOUNTER — Other Ambulatory Visit: Payer: Self-pay | Admitting: Family Medicine

## 2020-06-12 ENCOUNTER — Ambulatory Visit: Payer: PRIVATE HEALTH INSURANCE | Admitting: Allergy and Immunology

## 2020-06-12 ENCOUNTER — Encounter: Payer: Self-pay | Admitting: Family Medicine

## 2020-06-12 ENCOUNTER — Ambulatory Visit (INDEPENDENT_AMBULATORY_CARE_PROVIDER_SITE_OTHER): Payer: Medicare Other | Admitting: Family Medicine

## 2020-06-12 VITALS — BP 116/70 | HR 62 | Temp 97.7°F | Resp 16 | Ht 64.0 in | Wt 165.4 lb

## 2020-06-12 DIAGNOSIS — Z79899 Other long term (current) drug therapy: Secondary | ICD-10-CM

## 2020-06-12 DIAGNOSIS — I10 Essential (primary) hypertension: Secondary | ICD-10-CM | POA: Diagnosis not present

## 2020-06-12 DIAGNOSIS — J3089 Other allergic rhinitis: Secondary | ICD-10-CM | POA: Diagnosis not present

## 2020-06-12 MED ORDER — MOMETASONE FUROATE 50 MCG/ACT NA SUSP: NASAL | 3 refills | Status: DC

## 2020-06-14 MED FILL — LOTEMAX 0.5% EYE DROPS: 0.5 | 10 days supply | Qty: 5 | Fill #1

## 2020-06-25 ENCOUNTER — Other Ambulatory Visit: Payer: Self-pay | Admitting: Family Medicine

## 2020-06-25 MED FILL — LEVOCETIRIZINE 5 MG TABLET: 5 | 90 days supply | Qty: 90 | Fill #1

## 2020-06-25 MED FILL — NexIUM 40 MG CPDR: 40 | 90 days supply | Qty: 90 | Fill #1

## 2020-06-25 MED FILL — SPIRONOLACTONE 25 MG TABS: 25 | 90 days supply | Qty: 135 | Fill #1

## 2020-06-25 MED FILL — ATORVASTATIN CALCIUM 40 MG: 40 | 90 days supply | Qty: 66 | Fill #1

## 2020-06-25 MED FILL — GABAPENTIN 300 MG CAPSULE: 300 | 90 days supply | Qty: 90 | Fill #1

## 2020-06-25 MED FILL — METFORMIN HCL ER 750 MG TB2: 750 | 90 days supply | Qty: 180 | Fill #0

## 2020-06-26 DIAGNOSIS — C50911 Malignant neoplasm of unspecified site of right female breast: Secondary | ICD-10-CM | POA: Diagnosis not present

## 2020-07-02 ENCOUNTER — Ambulatory Visit (INDEPENDENT_AMBULATORY_CARE_PROVIDER_SITE_OTHER): Payer: Medicare Other | Admitting: Obstetrics & Gynecology

## 2020-07-02 ENCOUNTER — Other Ambulatory Visit: Payer: Self-pay

## 2020-07-02 ENCOUNTER — Encounter: Payer: Self-pay | Admitting: Obstetrics & Gynecology

## 2020-07-02 ENCOUNTER — Other Ambulatory Visit: Payer: Self-pay | Admitting: Obstetrics & Gynecology

## 2020-07-02 VITALS — BP 116/74 | HR 66 | Wt 167.0 lb

## 2020-07-02 DIAGNOSIS — N958 Other specified menopausal and perimenopausal disorders: Secondary | ICD-10-CM

## 2020-07-02 DIAGNOSIS — Z01419 Encounter for gynecological examination (general) (routine) without abnormal findings: Secondary | ICD-10-CM

## 2020-07-02 DIAGNOSIS — Z780 Asymptomatic menopausal state: Secondary | ICD-10-CM

## 2020-07-02 DIAGNOSIS — M8580 Other specified disorders of bone density and structure, unspecified site: Secondary | ICD-10-CM | POA: Diagnosis not present

## 2020-07-02 DIAGNOSIS — Z1231 Encounter for screening mammogram for malignant neoplasm of breast: Secondary | ICD-10-CM | POA: Diagnosis not present

## 2020-07-02 DIAGNOSIS — N952 Postmenopausal atrophic vaginitis: Secondary | ICD-10-CM

## 2020-07-02 MED ORDER — ESTRADIOL 10 MCG VA TABS: ORAL_TABLET | VAGINAL | 4 refills | Status: DC

## 2020-07-02 NOTE — Progress Notes (Signed)
Subjective:     Victoria Walker is a 70 y.o. female here for a routine exam.  Current complaints: none. She cont to take the Vagifem. She is taking it every 2-3 days. She reports significant dryness if she does not take it. Her husband was recently treated for prostate cancer with radiation. He has Parkinson's. She has no new complaints. Has a h/o breast cancer.        Gynecologic History No LMP recorded. Patient has had a hysterectomy. Contraception: post menopausal status Last Pap: s/p hysterectomy Last mammogram: 11/24/2019. Results were: normal  Obstetric History OB History  Gravida Para Term Preterm AB Living  3 3 3     3   SAB IAB Ectopic Multiple Live Births               # Outcome Date GA Lbr Len/2nd Weight Sex Delivery Anes PTL Lv  3 Term 63    F Vag-Spont     2 Term 1973    F Vag-Spont     1 Term 1969    M Vag-Spont      The following portions of the patient's history were reviewed and updated as appropriate: allergies, current medications, past family history, past medical history, past social history, past surgical history and problem list.  Review of Systems Pertinent items are noted in HPI.    Objective:  BP 116/74   Pulse 66   Wt 167 lb (75.8 kg)   BMI 28.67 kg/m   General Appearance:    Alert, cooperative, no distress, appears stated age  Head:    Normocephalic, without obvious abnormality, atraumatic  Eyes:    conjunctiva/corneas clear, EOM's intact, both eyes  Ears:    Normal external ear canals, both ears  Nose:   Nares normal, septum midline, mucosa normal, no drainage    or sinus tenderness  Throat:   Lips, mucosa, and tongue normal; teeth and gums normal  Neck:   Supple, symmetrical, trachea midline, no adenopathy;    thyroid:  no enlargement/tenderness/nodules  Back:     Symmetric, no curvature, ROM normal, no CVA tenderness  Lungs:     respirations unlabored  Chest Wall:    No tenderness or deformity   Heart:    Regular rate and rhythm  Breast  Exam:    No tenderness, masses, or nipple abnormality. S/p mastectomy on the right side. NO masses or skin changes noted. .  Abdomen:     Soft, non-tender, bowel sounds active all four quadrants,    no masses, no organomegaly  Genitalia:    Normal female without lesion, discharge or tenderness   S/p hysterectomy. NO masses noted.   Extremities:   Extremities normal, atraumatic, no cyanosis or edema  Pulses:   2+ and symmetric all extremities  Skin:   Skin color, texture, turgor normal, no rashes or lesions    Assessment:    Healthy female exam.   Breast cancer screening Genitourinary syndrome of menopause. On topical EES.     Plan:   Syan was seen today for gynecologic exam.  Diagnoses and all orders for this visit:  Well female exam with routine gynecological exam  Osteopenia after menopause -     Estradiol (YUVAFEM) 10 MCG TABS vaginal tablet; INSERT 1 TAB VAGINALLY AT BEDTIME EVERY OTHER DAY  Encounter for screening mammogram for malignant neoplasm of breast -     MM DIGITAL SCREENING BILATERAL; Future  Genitourinary syndrome of menopause  Senile atrophic vaginitis -  Estradiol (YUVAFEM) 10 MCG TABS vaginal tablet; INSERT 1 TAB VAGINALLY AT BEDTIME EVERY OTHER DAY  f/u in 1 year os sooner prn  Natavia Sublette L. Harraway-Smith, M.D., Cherlynn June

## 2020-07-16 DIAGNOSIS — E1140 Type 2 diabetes mellitus with diabetic neuropathy, unspecified: Secondary | ICD-10-CM | POA: Diagnosis not present

## 2020-07-17 ENCOUNTER — Telehealth: Payer: Self-pay | Admitting: *Deleted

## 2020-07-17 NOTE — Telephone Encounter (Signed)
Was needing to know if patient uses [facility].  Found that we had signed form before.  Advised that on pt voicemail.

## 2020-07-19 DIAGNOSIS — M1712 Unilateral primary osteoarthritis, left knee: Secondary | ICD-10-CM | POA: Diagnosis not present

## 2020-07-25 ENCOUNTER — Ambulatory Visit (INDEPENDENT_AMBULATORY_CARE_PROVIDER_SITE_OTHER): Payer: Medicare Other | Admitting: Gastroenterology

## 2020-07-25 ENCOUNTER — Encounter: Payer: Self-pay | Admitting: Gastroenterology

## 2020-07-25 VITALS — BP 100/68 | HR 79 | Ht 64.0 in | Wt 169.0 lb

## 2020-07-25 DIAGNOSIS — Z8619 Personal history of other infectious and parasitic diseases: Secondary | ICD-10-CM

## 2020-07-25 DIAGNOSIS — R1319 Other dysphagia: Secondary | ICD-10-CM | POA: Diagnosis not present

## 2020-07-25 DIAGNOSIS — Z86010 Personal history of colonic polyps: Secondary | ICD-10-CM

## 2020-07-25 NOTE — Patient Instructions (Signed)
Please follow up in one year 

## 2020-07-25 NOTE — Progress Notes (Signed)
dys

## 2020-07-27 NOTE — Progress Notes (Signed)
[place] VISIT   Primary Care Provider Mosie Lukes, MD [address] [place] [address] [phone]  Patient Profile: DEANNA WIATER is a 70 y.o. female with a pmh significant for Diabetes, hypertension, hyperlipidemia, hypothyroidism, prior breast cancer, sciatica, GERD, hiatal hernia, colon polyps (TAs), H. pylori infection (confirmed eradication).  The patient presents to the [facility] for an evaluation and management of problem(s) noted below:  Problem List 1. Esophageal dysphagia   2. History of Helicobacter infection     History of Present Illness Please see initial consultation note and prior progress notes for full details of HPI.  Interval History The patient returns for scheduled follow-up.  Patient stool studies indicate that she has eradicated H. pylori.  She is feeling well.  She has no dysphagia symptoms since her empiric dilation.  She remains on PPI therapy.  She has no complaints otherwise.    GI Review of Systems Positive as above Negative for nausea, vomiting, odynophagia, dysphagia, change in bowel habits   Review of Systems General: Denies fevers/chills/weight loss unintentionally Cardiovascular: Denies chest pain Pulmonary: Denies shortness of breath/nocturnal cough Gastroenterological: See HPI Genitourinary: Denies darkened urine Hematological: Denies easy bruising/bleeding Dermatological: Denies jaundice Psychological: Mood is stable   Medications Current Outpatient Medications  Medication Sig Dispense Refill  . aspirin 81 MG EC tablet Take 81 mg by mouth every other day. Swallow whole.    Marland Kitchen atorvastatin (LIPITOR) 40 MG tablet TAKE 1 TABLET BY MOUTH EVERY OTHER DAY, ALTERNATING WITH 1/2 TABLET 180 tablet 1  . AVAPRO 75 MG tablet TAKE 1 TABLET (75 MG TOTAL) BY MOUTH DAILY. 90 tablet 1  . Calcium Carbonate-Vitamin D (OS-CAL 500 + D PO)     . Carboxymethylcellul-Glycerin  (REFRESH OPTIVE OP) Apply to eye.    . Cholecalciferol (VITAMIN D) 2000 UNITS CAPS Take 1 capsule by mouth every evening.     . Ciclopirox 1 % shampoo Apply 1 each topically every Friday.     . clindamycin (CLEOCIN) 300 MG capsule TAKE 2 CAPSULES BY MOUTH 1 HR PRIOR TO DENTAL WORK    . COVID-19 mRNA vaccine, Pfizer, 30 MCG/0.3ML injection INJECT AS DIRECTED .3 mL 0  . cycloSPORINE (RESTASIS) 0.05 % ophthalmic emulsion PLACE 1 DROP INTO EACH EYE TWO TIMES DAILY 180 mL 3  . diltiazem (TIAZAC) 360 MG 24 hr capsule TAKE 1 CAPSULE (360 MG TOTAL) BY MOUTH DAILY. 90 capsule 1  . Estradiol 10 MCG TABS vaginal tablet INSERT 1 TAB VAGINALLY AT BEDTIME EVERY OTHER DAY 48 tablet 4  . fluconazole (DIFLUCAN) 150 MG tablet TAKE ONE TABLET BY MOUTH NOW REPEAT IN 3 DAYS IF NOT BETTER 2 tablet 0  . gabapentin (NEURONTIN) 300 MG capsule TAKE 1 CAPSULE (300 MG TOTAL) BY MOUTH AT BEDTIME. 90 capsule 1  . glucose blood (ONE TOUCH TEST STRIPS) test strip Use as directed twice daily to check blood sugar. DX code E11.9 300 each 1  . influenza vaccine adjuvanted (FLUAD) 0.5 ML injection TAKE AS DIRECTED .5 mL 0  . levocetirizine (XYZAL) 5 MG tablet TAKE 1 TABLET (5 MG TOTAL) BY MOUTH EVERY EVENING. 90 tablet 3  . levothyroxine (SYNTHROID) 88 MCG tablet TAKE 1 TABLET BY MOUTH ONCE DAILY **SKIP SUNDAY** 90 tablet 0  . LOTEMAX 0.5 % ophthalmic suspension PLACE 1 DROP 4 TIMES A DAY FOR 1 WEEK, THEN 3 TIMES A DAY FOR 1 WEEK, THEN 2 TIMES A DAY FOR 1 WEEK, THEN 1 TIME  A DAY FOR 1 WK, THEN STOP IN AFFECTED EYE 5 mL 1  . metFORMIN (GLUCOPHAGE-XR) 750 MG 24 hr tablet TAKE 1 TABLET (750 MG TOTAL) BY MOUTH IN THE MORNING AND AT BEDTIME. 180 tablet 1  . metoprolol succinate (TOPROL-XL) 25 MG 24 hr tablet TAKE 1 TABLET BY MOUTH EVERY MORNING AND EVERY NIGHT AT BEDTIME 180 tablet 0  . mometasone (NASONEX) 50 MCG/ACT nasal spray USE ONE SPRAY IN EACH NOSTRIL TWICE DAILY IF NEEDED FOR STUFFY NOSE 51 g 3  . NEXIUM 40 MG capsule TAKE 1  CAPSULE BY MOUTH ONCE DAILY 90 capsule 1  . Probiotic Product (PROBIOTIC DAILY PO) Take 1 capsule by mouth daily.    Marland Kitchen spironolactone (ALDACTONE) 25 MG tablet TAKE 1 & 1/2 TABLETS BY MOUTH DAILY 135 tablet 1   No current facility-administered medications for this visit.    Allergies Allergies  Allergen Reactions  . Codeine Anxiety    Jittery   . Penicillins Itching  . Sulfa Antibiotics Itching  . Vicodin [Hydrocodone-Acetaminophen] Itching    ITCHING     Histories Past Medical History:  Diagnosis Date  . Arthritis    "both knees" (07/02/2015)  . Cancer of right breast ([place])   . Claustrophobia   . Dysrhythmia    HX OF FAST HEART RATE AND PALPITATIONS - METOPROLOL HAS HELPED  . GERD (gastroesophageal reflux disease)   . H/O hiatal hernia   . H/O iritis    LEFT EYE - STATES HER EYE BECOMES RED AND VERY SENSITIVE TO LIGHT WHEN FLARE UP OF IRITIS  . Heart murmur    "benign; 06/2015"  . High cholesterol   . History of blood transfusion    "when I had my partial hysterectomy"  . History of chickenpox 09/26/2014  . History of measles   . History of MRSA infection   . History of shingles   . Hypertension   . Hypothyroidism   . Medicare annual wellness visit, initial 04/07/2015  . Multinodular thyroid    PT HAVING TROUBLE SWALLOWING  . Neuropathy    FEET  . Primary localized osteoarthritis of right knee   . Sciatica of right side 04/25/2015  . Spinal headache    spinal headache with epidural  . Total knee replacement status, right   . Type II diabetes mellitus (HCC)    ORAL MEDICATION - NO INSULIN  . Wears glasses    Past Surgical History:  Procedure Laterality Date  . ABDOMINAL HYSTERECTOMY  1988  . ABDOMINOPLASTY  1986  . ANTERIOR AND POSTERIOR REPAIR  05/11/2012   Procedure: ANTERIOR (CYSTOCELE) AND POSTERIOR REPAIR (RECTOCELE);  Surgeon: Reece Packer, MD;  Location: [facility];  Service: Urology;;  with graft  . BREAST BIOPSY Right   . CARDIAC CATHETERIZATION  1999   . COLONOSCOPY    . CYSTOSCOPY WITH URETHRAL DILATATION  05/11/2012   Procedure: CYSTOSCOPY WITH URETHRAL DILATATION;  Surgeon: Reece Packer, MD;  Location: [facility];  Service: Urology;;  . ESOPHAGOGASTRODUODENOSCOPY    . HERNIA REPAIR    . MASTECTOMY Right 2007  . PLANTAR FASCIA SURGERY Left   . THYROIDECTOMY N/A 02/23/2014   Procedure: TOTAL THYROIDECTOMY;  Surgeon: Armandina Gemma, MD;  Location: [facility];  Service: General;  Laterality: N/A;  . TOTAL KNEE ARTHROPLASTY Right 07/02/2015  . TOTAL KNEE ARTHROPLASTY Right 07/02/2015   Procedure: TOTAL KNEE ARTHROPLASTY;  Surgeon: Elsie Saas, MD;  Location: [facility];  Service: Orthopedics;  Laterality: Right;  . TOTAL THYROIDECTOMY  1976  removed three tumor (2 on right; 1 on left)  . TUBAL LIGATION    . UMBILICAL HERNIA REPAIR  1986  . VAGINAL PROLAPSE REPAIR  05/11/2012   Procedure: VAGINAL VAULT SUSPENSION;  Surgeon: Reece Packer, MD;  Location: [facility];  Service: Urology;;  . Clayborne Dana / PERINEUM BIOPSY     vulvar   Social History   Socioeconomic History  . Marital status: Married    Spouse name: Not on file  . Number of children: 3  . Years of education: Not on file  . Highest education level: Not on file  Occupational History    Comment: Retired  Tobacco Use  . Smoking status: Never Smoker  . Smokeless tobacco: Never Used  Vaping Use  . Vaping Use: Never used  Substance and Sexual Activity  . Alcohol use: No    Alcohol/week: 0.0 standard drinks  . Drug use: No  . Sexual activity: Not Currently    Birth control/protection: Surgical    Comment: lives with husband, retired from various jobs, diabetic diet  Other Topics Concern  . Not on file  Social History Narrative  . Not on file   Social Determinants of Health   Financial Resource Strain: Not on file  Food Insecurity: Not on file  Transportation Needs: Not on file  Physical Activity: Not on file  Stress: Not on file  Social Connections: Not on file  Intimate  Partner Violence: Not on file   Family History  Problem Relation Age of Onset  . Hypertension Mother   . Heart disease Mother        pacer, CHF  . Hyperlipidemia Mother   . Kidney disease Mother        1 kidney due to stone  . Kidney Stones Mother   . Hypertension Father   . Diabetes Father   . Alcohol abuse Father   . Lung cancer Father        lung, psa  . Diabetes Sister   . Hypertension Sister   . Breast cancer Sister   . Diabetes Brother   . Hypertension Brother   . Hyperlipidemia Brother   . Kidney disease Brother        dialysis  . Stroke Brother   . Hypertension Maternal Grandmother   . Heart disease Maternal Grandmother   . Stroke Maternal Grandmother   . Hypertension Maternal Grandfather   . Hypertension Daughter   . Arthritis Son   . Hyperlipidemia Sister   . Hypertension Sister   . Diabetes Sister   . Thyroid disease Sister   . Kidney disease Sister   . Endometriosis Sister   . Breast cancer Sister        thyroid and breast  . Thyroid cancer Sister   . Arthritis Sister        back pain  . Hypertension Sister   . Thyroid disease Sister   . Hypertension Brother   . Hyperlipidemia Brother   . Hypertension Brother   . Hyperlipidemia Brother   . Benign prostatic hyperplasia Brother   . Hypertension Daughter   . Hyperlipidemia Daughter   . Diabetes Daughter   . Hypertension Maternal Aunt   . Hypertension Maternal Uncle   . Hypertension Paternal Aunt   . Cancer Paternal Aunt   . Hypertension Paternal Uncle   . Colon cancer Neg Hx   . Stomach cancer Neg Hx   . Esophageal cancer Neg Hx   . Pancreatic cancer Neg Hx   .  Liver disease Neg Hx   . Rectal cancer Neg Hx   . Inflammatory bowel disease Neg Hx    I have reviewed her medical, social, and family history in detail and updated the electronic medical record as necessary.    PHYSICAL EXAMINATION  BP 100/68   Pulse 79   Ht 5\' 4"  (1.626 m)   Wt 169 lb (76.7 kg)   BMI 29.01 kg/m  Wt Readings  from Last 3 Encounters:  07/25/20 169 lb (76.7 kg)  07/02/20 167 lb (75.8 kg)  06/12/20 165 lb 6.4 oz (75 kg)  GEN: NAD, appears stated age, doesn't appear chronically ill PSYCH: Cooperative, without pressured speech EYE: Conjunctivae pink, sclerae anicteric ENT: Masked CV: Nontachycardic RESP: No audible wheezing GI: NABS, soft, rounded, NT/ND, without rebound or guarding MSK/EXT: No significant lower extremity edema SKIN: No jaundice NEURO:  Alert & Oriented x 3, no focal deficits   REVIEW OF DATA  I reviewed the following data at the time of this encounter:  GI Procedures and Studies  Previously reviewed  Laboratory Studies  Reviewed those in epic and care everywhere  Imaging Studies  No relevant studies to review   ASSESSMENT  Ms. Andress is a 70 y.o. female with a pmh significant for Diabetes, hypertension, hyperlipidemia, hypothyroidism, prior breast cancer, sciatica, GERD, hiatal hernia, colon polyps (TAs), H. pylori infection (confirmed eradication).  The patient is seen today for evaluation and management of:  1. Esophageal dysphagia   2. History of Helicobacter infection    The patient is clinically and hemodynamically stable.  No dysphagia symptoms at this time.  Hopefully this is longer standing.  She remains on Nexium 40 mg daily.  If she continues to do well in the course of the coming months she can be transitioned to Nexium 20 mg daily.  If patient has further issues in future we can consider repeat dilation since she has done so well.  No esophageal manometry testing for now.  All patient questions were answered to the best of my ability, and the patient agrees to the aforementioned plan of action with follow-up as indicated.   PLAN  Continue Nexium 40 mg daily -May consider in the coming months if she does well to decrease to 20 mg Repeat dilation as needed if she continues to do well Hold on esophageal manometry for now Surveillance colonoscopy in  2024   No orders of the defined types were placed in this encounter.   New Prescriptions   No medications on file   Modified Medications   No medications on file    Planned Follow Up Return in about 1 year (around 07/25/2021).   Total Time in Face-to-Face and in Coordination of Care for patient including independent/personal interpretation/review of prior testing, medical history, examination, medication adjustment, communicating results with the patient directly, and documentation with the EHR is 25 minutes.  Justice Britain, MD [facility] Office # [phone]

## 2020-07-28 ENCOUNTER — Encounter: Payer: Self-pay | Admitting: Gastroenterology

## 2020-08-02 ENCOUNTER — Telehealth: Payer: Self-pay | Admitting: Family Medicine

## 2020-08-02 NOTE — Telephone Encounter (Signed)
Pt informed of this and will try this and give [place] an update on monday

## 2020-08-02 NOTE — Telephone Encounter (Signed)
Can you please find out when this started? Please have her use nasal saline rinses, saline nasal spray and begin Mucinex [phone] mg twice a day. Please have her stop her antihistamine for a few days to loosen up that drainage. Thank you

## 2020-08-02 NOTE — Telephone Encounter (Signed)
Patient called stating she is having lots of drainage asking for an RX for antibiotics to be called in if possible please advise

## 2020-08-02 NOTE — Telephone Encounter (Signed)
Pts having a stuffy nose with drainage, sinus pressure, no fever, drainage is yellowish green. Has been using chloraseptic for her throat. Started a couple of days ago. Taking antihistamine, doing nasal saline rinses and nasal spray.

## 2020-08-17 ENCOUNTER — Other Ambulatory Visit (HOSPITAL_BASED_OUTPATIENT_CLINIC_OR_DEPARTMENT_OTHER): Payer: Self-pay

## 2020-08-17 DIAGNOSIS — H40023 Open angle with borderline findings, high risk, bilateral: Secondary | ICD-10-CM | POA: Diagnosis not present

## 2020-08-17 MED ORDER — LOTEMAX 0.5 % OP SUSP
OPHTHALMIC | 3 refills | Status: DC
  Filled 2020-08-17: qty 10, 20d supply, fill #0

## 2020-08-20 ENCOUNTER — Other Ambulatory Visit: Payer: Self-pay | Admitting: Family Medicine

## 2020-08-20 ENCOUNTER — Other Ambulatory Visit (HOSPITAL_BASED_OUTPATIENT_CLINIC_OR_DEPARTMENT_OTHER): Payer: Self-pay

## 2020-08-20 MED ORDER — METOPROLOL SUCCINATE ER 25 MG PO TB24
ORAL_TABLET | ORAL | 1 refills | Status: DC
  Filled 2020-08-20: qty 180, 90d supply, fill #0
  Filled 2020-11-20: qty 180, 90d supply, fill #1

## 2020-08-20 MED ORDER — DILTIAZEM HCL ER BEADS 360 MG PO CP24
ORAL_CAPSULE | ORAL | 1 refills | Status: DC
  Filled 2020-08-20 (×2): qty 90, 90d supply, fill #0
  Filled 2020-11-20: qty 90, 90d supply, fill #1

## 2020-08-20 MED ORDER — LEVOTHYROXINE SODIUM 88 MCG PO TABS
ORAL_TABLET | ORAL | 1 refills | Status: DC
  Filled 2020-08-20: qty 77, 90d supply, fill #0
  Filled 2020-09-21 – 2020-11-20 (×2): qty 77, 90d supply, fill #1
  Filled 2021-02-18: qty 77, 90d supply, fill #2

## 2020-08-20 MED FILL — Cyclosporine (Ophth) Emulsion 0.05%: OPHTHALMIC | 30 days supply | Qty: 60 | Fill #0 | Status: CN

## 2020-08-20 MED FILL — Mometasone Furoate Nasal Susp 50 MCG/ACT: NASAL | 30 days supply | Qty: 51 | Fill #0 | Status: AC

## 2020-08-20 MED FILL — Cyclosporine (Ophth) Emulsion 0.05%: OPHTHALMIC | 90 days supply | Qty: 180 | Fill #0 | Status: AC

## 2020-08-20 MED FILL — Irbesartan Tab 75 MG: ORAL | 90 days supply | Qty: 90 | Fill #0 | Status: AC

## 2020-08-21 ENCOUNTER — Other Ambulatory Visit (HOSPITAL_BASED_OUTPATIENT_CLINIC_OR_DEPARTMENT_OTHER): Payer: Self-pay

## 2020-08-21 ENCOUNTER — Encounter: Payer: Self-pay | Admitting: Family Medicine

## 2020-08-21 ENCOUNTER — Other Ambulatory Visit: Payer: Self-pay

## 2020-08-21 ENCOUNTER — Ambulatory Visit (INDEPENDENT_AMBULATORY_CARE_PROVIDER_SITE_OTHER): Payer: Medicare Other | Admitting: Family Medicine

## 2020-08-21 VITALS — BP 124/70 | HR 72 | Temp 98.1°F | Resp 16

## 2020-08-21 DIAGNOSIS — R1310 Dysphagia, unspecified: Secondary | ICD-10-CM | POA: Diagnosis not present

## 2020-08-21 DIAGNOSIS — J3089 Other allergic rhinitis: Secondary | ICD-10-CM

## 2020-08-21 NOTE — Progress Notes (Signed)
[place] [id number] Dept: [phone]  FOLLOW UP NOTE  Patient ID: Victoria Walker, female    DOB: 04-Jan-1951  Age: 70 y.o. MRN: [id number] Date of Office Visit: 08/21/2020  Assessment  Chief Complaint: Allergic Rhinitis   HPI Victoria Walker is a 70 year old female who presents to the clinic for follow-up visit.  She was last seen in this clinic on 06/12/2020 for evaluation of allergic rhinitis and possible glaucoma. In the interim, she reports that about 2 weeks ago, she received azithromycin for symptoms including sinus pressure and nasal drainage with resolution of symptoms. At today's visit, she reports that her allergic rhinitis has been well controlled with no symptoms of allergic rhinitis. She continues cetirizine 5 mg once a day, mometasone nasal spray daily, and occasional nasal saline rinses. She has recently had an eye exam at [facility]. She does report dysphagia occurring occasionally with solid foods. She denies dysphagia or choking with liquids. She continues to follow Dr. Rush Landmark at [place] for management of esophageal dysphagia which she takes Nexium with good control. Her current medications are listed in the chart.   Drug Allergies:  Allergies  Allergen Reactions  . Codeine Anxiety    Jittery   . Penicillins Itching  . Sulfa Antibiotics Itching  . Vicodin [Hydrocodone-Acetaminophen] Itching    ITCHING     Physical Exam: BP 124/70 (BP Location: Right Arm, Patient Position: Sitting, Cuff Size: Normal)   Pulse 72   Temp 98.1 F (36.7 C) (Tympanic)   Resp 16   SpO2 98%    Physical Exam Vitals reviewed.  Constitutional:      Appearance: Normal appearance.  HENT:     Head: Normocephalic and atraumatic.     Right Ear: Tympanic membrane normal.     Left Ear: Tympanic membrane normal.     Nose:     Comments: Bilateral nares slightly erythematous with clear nasal drainage noted. Pharynx normal. Ears normal. Eyes  normal.    Mouth/Throat:     Pharynx: Oropharynx is clear.  Eyes:     Conjunctiva/sclera: Conjunctivae normal.  Cardiovascular:     Rate and Rhythm: Normal rate and regular rhythm.     Heart sounds: Normal heart sounds. No murmur heard.   Pulmonary:     Effort: Pulmonary effort is normal.     Breath sounds: Normal breath sounds.     Comments: Lungs clear to auscultation Musculoskeletal:        General: Normal range of motion.     Cervical back: Normal range of motion and neck supple.  Skin:    General: Skin is warm and dry.  Neurological:     Mental Status: She is alert and oriented to person, place, and time.  Psychiatric:        Mood and Affect: Mood normal.        Behavior: Behavior normal.        Thought Content: Thought content normal.        Judgment: Judgment normal.    Assessment and Plan: 1. Other allergic rhinitis   2. Dysphagia, unspecified type     Patient Instructions  Allergic rhinitis Continue levocetirizine 5 mg once a day as needed for runny nose or itch. Remember to rotate to a different antihistamine about every 3 months. Some examples of over the counter antihistamines include Zyrtec (cetirizine), Xyzal (levocetirizine), [name] (fexofenadine), and Claritin (loratidine).  Continue Nasonex 1 spray in each nostril twice a day if  needed for a stuffy nose.  In the right nostril, point the applicator out toward the right ear. In the left nostril, point the applicator out toward the left ear Continue saline nasal rinses as needed for nasal symptoms. Use this before any medicated nasal sprays for best result Consider nasal saline gel as needed for dry or irritated nostrils  Call the clinic if this treatment plan is not working well for you  Follow up in 1 year or sooner if needed.    Return in about 1 year (around 08/21/2021), or if symptoms worsen or fail to improve.    Thank you for the opportunity to care for this patient.  Please do not hesitate to  contact me with questions.  Gareth Morgan, FNP [facility]

## 2020-08-21 NOTE — Patient Instructions (Addendum)
Allergic rhinitis Continue levocetirizine 5 mg once a day as needed for runny nose or itch. Remember to rotate to a different antihistamine about every 3 months. Some examples of over the counter antihistamines include Zyrtec (cetirizine), Xyzal (levocetirizine), [name] (fexofenadine), and Claritin (loratidine).  Continue Nasonex 1 spray in each nostril twice a day if needed for a stuffy nose.  In the right nostril, point the applicator out toward the right ear. In the left nostril, point the applicator out toward the left ear Continue saline nasal rinses as needed for nasal symptoms. Use this before any medicated nasal sprays for best result Consider nasal saline gel as needed for dry or irritated nostrils  Call the clinic if this treatment plan is not working well for you  Follow up in 1 year or sooner if needed.

## 2020-08-22 ENCOUNTER — Other Ambulatory Visit (HOSPITAL_BASED_OUTPATIENT_CLINIC_OR_DEPARTMENT_OTHER): Payer: Self-pay

## 2020-08-22 ENCOUNTER — Encounter: Payer: Self-pay | Admitting: Family Medicine

## 2020-08-23 ENCOUNTER — Other Ambulatory Visit (HOSPITAL_BASED_OUTPATIENT_CLINIC_OR_DEPARTMENT_OTHER): Payer: Self-pay

## 2020-08-23 MED ORDER — LOTEPREDNOL ETABONATE 0.5 % OP SUSP
OPHTHALMIC | 3 refills | Status: DC
  Filled 2020-08-23: qty 10, 25d supply, fill #0
  Filled 2020-08-27: qty 5, 25d supply, fill #0
  Filled 2020-08-27: qty 10, 50d supply, fill #0
  Filled 2020-12-17 – 2020-12-18 (×3): qty 5, 25d supply, fill #1

## 2020-08-24 ENCOUNTER — Other Ambulatory Visit (HOSPITAL_BASED_OUTPATIENT_CLINIC_OR_DEPARTMENT_OTHER): Payer: Self-pay

## 2020-08-27 ENCOUNTER — Other Ambulatory Visit (HOSPITAL_BASED_OUTPATIENT_CLINIC_OR_DEPARTMENT_OTHER): Payer: Self-pay

## 2020-08-30 ENCOUNTER — Other Ambulatory Visit (HOSPITAL_BASED_OUTPATIENT_CLINIC_OR_DEPARTMENT_OTHER): Payer: Self-pay

## 2020-09-21 ENCOUNTER — Other Ambulatory Visit (HOSPITAL_BASED_OUTPATIENT_CLINIC_OR_DEPARTMENT_OTHER): Payer: Self-pay

## 2020-09-21 ENCOUNTER — Other Ambulatory Visit: Payer: Self-pay | Admitting: Family Medicine

## 2020-09-21 DIAGNOSIS — K219 Gastro-esophageal reflux disease without esophagitis: Secondary | ICD-10-CM

## 2020-09-21 DIAGNOSIS — R109 Unspecified abdominal pain: Secondary | ICD-10-CM

## 2020-09-21 DIAGNOSIS — I10 Essential (primary) hypertension: Secondary | ICD-10-CM

## 2020-09-21 DIAGNOSIS — Z780 Asymptomatic menopausal state: Secondary | ICD-10-CM

## 2020-09-21 DIAGNOSIS — E785 Hyperlipidemia, unspecified: Secondary | ICD-10-CM

## 2020-09-21 MED ORDER — GABAPENTIN 300 MG PO CAPS
ORAL_CAPSULE | Freq: Every day | ORAL | 1 refills | Status: DC
  Filled 2020-09-21: qty 90, 90d supply, fill #0
  Filled 2020-12-09: qty 90, 90d supply, fill #1

## 2020-09-21 MED ORDER — SPIRONOLACTONE 25 MG PO TABS
ORAL_TABLET | Freq: Every day | ORAL | 1 refills | Status: DC
  Filled 2020-09-21: qty 135, 90d supply, fill #0
  Filled 2020-12-09: qty 135, 90d supply, fill #1

## 2020-09-21 MED ORDER — NEXIUM 40 MG PO CPDR
40.0000 mg | DELAYED_RELEASE_CAPSULE | Freq: Every day | ORAL | 1 refills | Status: DC
  Filled 2020-09-21: qty 90, 90d supply, fill #0
  Filled 2020-12-09: qty 90, 90d supply, fill #1

## 2020-09-21 MED FILL — Metformin HCl Tab ER 24HR 750 MG: ORAL | 90 days supply | Qty: 180 | Fill #0 | Status: AC

## 2020-09-21 MED FILL — Estradiol Vaginal Tab 10 MCG: VAGINAL | 90 days supply | Qty: 45 | Fill #0 | Status: AC

## 2020-09-21 MED FILL — Atorvastatin Calcium Tab 40 MG (Base Equivalent): ORAL | 90 days supply | Qty: 66 | Fill #0 | Status: AC

## 2020-09-21 MED FILL — Levocetirizine Dihydrochloride Tab 5 MG: ORAL | 90 days supply | Qty: 90 | Fill #0 | Status: AC

## 2020-09-24 ENCOUNTER — Other Ambulatory Visit (HOSPITAL_BASED_OUTPATIENT_CLINIC_OR_DEPARTMENT_OTHER): Payer: Self-pay

## 2020-10-10 DIAGNOSIS — E1140 Type 2 diabetes mellitus with diabetic neuropathy, unspecified: Secondary | ICD-10-CM | POA: Diagnosis not present

## 2020-10-24 ENCOUNTER — Other Ambulatory Visit (HOSPITAL_BASED_OUTPATIENT_CLINIC_OR_DEPARTMENT_OTHER): Payer: Self-pay

## 2020-10-24 DIAGNOSIS — L811 Chloasma: Secondary | ICD-10-CM | POA: Diagnosis not present

## 2020-10-24 DIAGNOSIS — L218 Other seborrheic dermatitis: Secondary | ICD-10-CM | POA: Diagnosis not present

## 2020-10-24 MED ORDER — CICLOPIROX 1 % EX SHAM
MEDICATED_SHAMPOO | CUTANEOUS | 11 refills | Status: DC
  Filled 2020-10-24: qty 120, 30d supply, fill #0
  Filled 2020-11-20: qty 120, 30d supply, fill #1
  Filled 2020-12-14: qty 120, 30d supply, fill #2
  Filled 2021-01-21: qty 120, 30d supply, fill #3
  Filled 2021-02-18: qty 120, 30d supply, fill #4
  Filled 2021-03-22: qty 120, 30d supply, fill #5
  Filled 2021-04-23: qty 120, 30d supply, fill #6
  Filled 2021-05-22: qty 120, 30d supply, fill #7
  Filled 2021-06-19: qty 120, 30d supply, fill #8
  Filled 2021-07-23: qty 120, 30d supply, fill #9
  Filled 2021-08-15: qty 120, 30d supply, fill #10
  Filled 2021-09-20: qty 120, 30d supply, fill #11

## 2020-10-25 ENCOUNTER — Other Ambulatory Visit (HOSPITAL_BASED_OUTPATIENT_CLINIC_OR_DEPARTMENT_OTHER): Payer: Self-pay

## 2020-10-26 ENCOUNTER — Other Ambulatory Visit (HOSPITAL_BASED_OUTPATIENT_CLINIC_OR_DEPARTMENT_OTHER): Payer: Self-pay

## 2020-10-26 ENCOUNTER — Ambulatory Visit: Payer: Medicare Other | Attending: Internal Medicine

## 2020-10-26 DIAGNOSIS — Z23 Encounter for immunization: Secondary | ICD-10-CM

## 2020-10-26 NOTE — Progress Notes (Signed)
   Covid-19 Vaccination Clinic  Name:  Victoria Walker    MRN: [id number] DOB: 1950-12-29  10/26/2020  Victoria Walker was observed post Covid-19 immunization for 15 minutes without incident. She was provided with Vaccine Information Sheet and instruction to access the V-Safe system.   Victoria Walker was instructed to call 911 with any severe reactions post vaccine: Difficulty breathing  Swelling of face and throat  A fast heartbeat  A bad rash all over body  Dizziness and weakness   Immunizations Administered     Name Date Dose VIS Date Route   PFIZER Comrnaty(Gray TOP) Covid-19 Vaccine 10/26/2020 12:24 PM 0.3 mL 03/29/2020 Intramuscular   Manufacturer: [place]   Lot: [id number]   [place]: [phone]

## 2020-11-02 ENCOUNTER — Other Ambulatory Visit (HOSPITAL_BASED_OUTPATIENT_CLINIC_OR_DEPARTMENT_OTHER): Payer: Self-pay

## 2020-11-02 MED ORDER — COVID-19 MRNA VAC-TRIS(PFIZER) 30 MCG/0.3ML IM SUSP
INTRAMUSCULAR | 0 refills | Status: DC
  Filled 2020-11-02: qty 0.3, 1d supply, fill #0

## 2020-11-07 ENCOUNTER — Ambulatory Visit (INDEPENDENT_AMBULATORY_CARE_PROVIDER_SITE_OTHER): Payer: Medicare Other

## 2020-11-07 VITALS — Ht 65.0 in | Wt 169.0 lb

## 2020-11-07 DIAGNOSIS — Z0000 Encounter for general adult medical examination without abnormal findings: Secondary | ICD-10-CM | POA: Diagnosis not present

## 2020-11-07 NOTE — Patient Instructions (Signed)
Victoria Walker , Thank you for taking time to complete your Medicare Wellness Visit. I appreciate your ongoing commitment to your health goals. Please review the following plan we discussed and let me know if I can assist you in the future.   Screening recommendations/referrals: Colonoscopy: Completed 02/14/2020-Due 02/14/2023 Mammogram: Per our conversation, scheduled for 11/27/2019 Bone Density: Completed 03/31/2019-Due 03/30/2021 Recommended yearly ophthalmology/optometry visit for glaucoma screening and checkup Recommended yearly dental visit for hygiene and checkup  Vaccinations: Influenza vaccine: Up to date Pneumococcal vaccine: Up to date Tdap vaccine: Up to date-Due 05/02/2024 Shingles vaccine: Completed vaccines   Covid-19:Up to date  Advanced directives: Copy in chart  Conditions/risks identified: See problem list  Next appointment: Follow up in one year for your annual wellness visit 11/11/2021 @ 9:00   Preventive Care 37 Years and Older, Female Preventive care refers to lifestyle choices and visits with your health care provider that can promote health and wellness. What does preventive care include? A yearly physical exam. This is also called an annual well check. Dental exams once or twice a year. Routine eye exams. Ask your health care provider how often you should have your eyes checked. Personal lifestyle choices, including: Daily care of your teeth and gums. Regular physical activity. Eating a healthy diet. Avoiding tobacco and drug use. Limiting alcohol use. Practicing safe sex. Taking low-dose aspirin every day. Taking vitamin and mineral supplements as recommended by your health care provider. What happens during an annual well check? The services and screenings done by your health care provider during your annual well check will depend on your age, overall health, lifestyle risk factors, and family history of disease. Counseling  Your health care provider  may ask you questions about your: Alcohol use. Tobacco use. Drug use. Emotional well-being. Home and relationship well-being. Sexual activity. Eating habits. History of falls. Memory and ability to understand (cognition). Work and work Statistician. Reproductive health. Screening  You may have the following tests or measurements: Height, weight, and BMI. Blood pressure. Lipid and cholesterol levels. These may be checked every 5 years, or more frequently if you are over 43 years old. Skin check. Lung cancer screening. You may have this screening every year starting at age 31 if you have a 30-pack-year history of smoking and currently smoke or have quit within the past 15 years. Fecal occult blood test (FOBT) of the stool. You may have this test every year starting at age 46. Flexible sigmoidoscopy or colonoscopy. You may have a sigmoidoscopy every 5 years or a colonoscopy every 10 years starting at age 40. Hepatitis C blood test. Hepatitis B blood test. Sexually transmitted disease (STD) testing. Diabetes screening. This is done by checking your blood sugar (glucose) after you have not eaten for a while (fasting). You may have this done every 1-3 years. Bone density scan. This is done to screen for osteoporosis. You may have this done starting at age 79. Mammogram. This may be done every 1-2 years. Talk to your health care provider about how often you should have regular mammograms. Talk with your health care provider about your test results, treatment options, and if necessary, the need for more tests. Vaccines  Your health care provider may recommend certain vaccines, such as: Influenza vaccine. This is recommended every year. Tetanus, diphtheria, and acellular pertussis (Tdap, Td) vaccine. You may need a Td booster every 10 years. Zoster vaccine. You may need this after age 78. Pneumococcal 13-valent conjugate (PCV13) vaccine. One dose is recommended after age  65. Pneumococcal  polysaccharide (PPSV23) vaccine. One dose is recommended after age 79. Talk to your health care provider about which screenings and vaccines you need and how often you need them. This information is not intended to replace advice given to you by your health care provider. Make sure you discuss any questions you have with your health care provider. Document Released: 05/04/2015 Document Revised: 12/26/2015 Document Reviewed: 02/06/2015 Elsevier Interactive Patient Education  2017 [place] Prevention in the Home Falls can cause injuries. They can happen to people of all ages. There are many things you can do to make your home safe and to help prevent falls. What can I do on the outside of my home? Regularly fix the edges of walkways and driveways and fix any cracks. Remove anything that might make you trip as you walk through a door, such as a raised step or threshold. Trim any bushes or trees on the path to your home. Use bright outdoor lighting. Clear any walking paths of anything that might make someone trip, such as rocks or tools. Regularly check to see if handrails are loose or broken. Make sure that both sides of any steps have handrails. Any raised decks and porches should have guardrails on the edges. Have any leaves, snow, or ice cleared regularly. Use sand or salt on walking paths during winter. Clean up any spills in your garage right away. This includes oil or grease spills. What can I do in the bathroom? Use night lights. Install grab bars by the toilet and in the tub and shower. Do not use towel bars as grab bars. Use non-skid mats or decals in the tub or shower. If you need to sit down in the shower, use a plastic, non-slip stool. Keep the floor dry. Clean up any water that spills on the floor as soon as it happens. Remove soap buildup in the tub or shower regularly. Attach bath mats securely with double-sided non-slip rug tape. Do not have throw rugs and other  things on the floor that can make you trip. What can I do in the bedroom? Use night lights. Make sure that you have a light by your bed that is easy to reach. Do not use any sheets or blankets that are too big for your bed. They should not hang down onto the floor. Have a firm chair that has side arms. You can use this for support while you get dressed. Do not have throw rugs and other things on the floor that can make you trip. What can I do in the kitchen? Clean up any spills right away. Avoid walking on wet floors. Keep items that you use a lot in easy-to-reach places. If you need to reach something above you, use a strong step stool that has a grab bar. Keep electrical cords out of the way. Do not use floor polish or wax that makes floors slippery. If you must use wax, use non-skid floor wax. Do not have throw rugs and other things on the floor that can make you trip. What can I do with my stairs? Do not leave any items on the stairs. Make sure that there are handrails on both sides of the stairs and use them. Fix handrails that are broken or loose. Make sure that handrails are as long as the stairways. Check any carpeting to make sure that it is firmly attached to the stairs. Fix any carpet that is loose or worn. Avoid having throw rugs at  the top or bottom of the stairs. If you do have throw rugs, attach them to the floor with carpet tape. Make sure that you have a light switch at the top of the stairs and the bottom of the stairs. If you do not have them, ask someone to add them for you. What else can I do to help prevent falls? Wear shoes that: Do not have high heels. Have rubber bottoms. Are comfortable and fit you well. Are closed at the toe. Do not wear sandals. If you use a stepladder: Make sure that it is fully opened. Do not climb a closed stepladder. Make sure that both sides of the stepladder are locked into place. Ask someone to hold it for you, if possible. Clearly  mark and make sure that you can see: Any grab bars or handrails. First and last steps. Where the edge of each step is. Use tools that help you move around (mobility aids) if they are needed. These include: Canes. Walkers. Scooters. Crutches. Turn on the lights when you go into a dark area. Replace any light bulbs as soon as they burn out. Set up your furniture so you have a clear path. Avoid moving your furniture around. If any of your floors are uneven, fix them. If there are any pets around you, be aware of where they are. Review your medicines with your doctor. Some medicines can make you feel dizzy. This can increase your chance of falling. Ask your doctor what other things that you can do to help prevent falls. This information is not intended to replace advice given to you by your health care provider. Make sure you discuss any questions you have with your health care provider. Document Released: 02/01/2009 Document Revised: 09/13/2015 Document Reviewed: 05/12/2014 Elsevier Interactive Patient Education  2017 [employer].

## 2020-11-07 NOTE — Progress Notes (Signed)
Subjective:   Victoria Walker is a 70 y.o. female who presents for Medicare Annual (Subsequent) preventive examination.  I connected with Verl today by telephone and verified that I am speaking with the correct person using two identifiers. Location patient: home Location provider: work Persons participating in the virtual visit: patient, Marine scientist.    I discussed the limitations, risks, security and privacy concerns of performing an evaluation and management service by telephone and the availability of in person appointments. I also discussed with the patient that there may be a patient responsible charge related to this service. The patient expressed understanding and verbally consented to this telephonic visit.    Interactive audio and video telecommunications were attempted between this provider and patient, however failed, due to patient having technical difficulties OR patient did not have access to video capability.  We continued and completed visit with audio only.  Some vital signs may be absent or patient reported.   Time Spent with patient on telephone encounter: 20 minutes   Review of Systems     Cardiac Risk Factors include: advanced age (>73men, >36 women);diabetes mellitus;dyslipidemia;hypertension     Objective:    Today's Vitals   11/07/20 1540  Weight: 169 lb (76.7 kg)  Height: 5\' 5"  (1.651 m)   Body mass index is 28.12 kg/m.  Advanced Directives 11/07/2020 03/21/2019 05/17/2018 03/11/2018 03/17/2017 03/10/2017 02/25/2016  Does Patient Have a Medical Advance Directive? Yes Yes Yes Yes Yes Yes Yes  Type of Paramedic of [place];Living will [place];Living will - [place];Living will - [place];Living will [place];Living will  Does patient want to make changes to medical advance directive? - No - Patient declined No - Patient declined - No - Patient declined  - -  Copy of [place] in Chart? Yes - validated most recent copy scanned in chart (See row information) Yes - validated most recent copy scanned in chart (See row information) - Yes - validated most recent copy scanned in chart (See row information) - No - copy requested No - copy requested  Would patient like information on creating a medical advance directive? - - - - - - -  Pre-existing out of facility DNR order (yellow form or pink MOST form) - - - - - - -    Current Medications (verified) Outpatient Encounter Medications as of 11/07/2020  Medication Sig   aspirin 81 MG EC tablet Take 81 mg by mouth every other day. Swallow whole.   atorvastatin (LIPITOR) 40 MG tablet TAKE 1 TABLET BY MOUTH EVERY OTHER DAY, ALTERNATING WITH 1/2 TABLET   AVAPRO 75 MG tablet TAKE 1 TABLET (75 MG TOTAL) BY MOUTH DAILY.   Calcium Carbonate-Vitamin D (OS-CAL 500 + D PO)    Carboxymethylcellul-Glycerin (REFRESH OPTIVE OP) Apply to eye.   Cholecalciferol (VITAMIN D) 2000 UNITS CAPS Take 1 capsule by mouth every evening.    Ciclopirox 1 % shampoo Apply 1 each topically every Friday.    Ciclopirox 1 % shampoo apply to affected area(s) as directed twice a week   clindamycin (CLEOCIN) 300 MG capsule TAKE 2 CAPSULES BY MOUTH 1 HR PRIOR TO DENTAL WORK   COVID-19 mRNA vaccine, Pfizer, 30 MCG/0.3ML injection INJECT AS DIRECTED   cycloSPORINE (RESTASIS) 0.05 % ophthalmic emulsion PLACE 1 DROP INTO EACH EYE TWO TIMES DAILY   diltiazem (TIAZAC) 360 MG 24 hr capsule TAKE 1 CAPSULE (360 MG TOTAL) BY MOUTH DAILY.  Estradiol 10 MCG TABS vaginal tablet INSERT 1 TAB VAGINALLY AT BEDTIME EVERY OTHER DAY   gabapentin (NEURONTIN) 300 MG capsule TAKE 1 CAPSULE (300 MG TOTAL) BY MOUTH AT BEDTIME.   glucose blood (ONE TOUCH TEST STRIPS) test strip Use as directed twice daily to check blood sugar. DX code E11.9   influenza vaccine adjuvanted (FLUAD) 0.5 ML injection TAKE AS DIRECTED   levocetirizine (XYZAL) 5 MG  tablet TAKE 1 TABLET (5 MG TOTAL) BY MOUTH EVERY EVENING.   levothyroxine (SYNTHROID) 88 MCG tablet TAKE 1 TABLET BY MOUTH ONCE DAILY **SKIP SUNDAY**   LOTEMAX 0.5 % ophthalmic suspension PLACE 1 DROP 4 TIMES A DAY FOR 1 WEEK, THEN 3 TIMES A DAY FOR 1 WEEK, THEN 2 TIMES A DAY FOR 1 WEEK, THEN 1 TIME A DAY FOR 1 WK, THEN STOP IN AFFECTED EYE   loteprednol (LOTEMAX) 0.5 % ophthalmic suspension Place one drop into both eyes four times daily with flare up.   metFORMIN (GLUCOPHAGE-XR) 750 MG 24 hr tablet TAKE 1 TABLET (750 MG TOTAL) BY MOUTH IN THE MORNING AND AT BEDTIME.   metoprolol succinate (TOPROL-XL) 25 MG 24 hr tablet TAKE 1 TABLET BY MOUTH EVERY MORNING AND EVERY NIGHT AT BEDTIME   mometasone (NASONEX) 50 MCG/ACT nasal spray USE ONE SPRAY IN EACH NOSTRIL TWICE DAILY IF NEEDED FOR STUFFY NOSE   NEXIUM 40 MG capsule TAKE 1 CAPSULE BY MOUTH ONCE DAILY   Probiotic Product (PROBIOTIC DAILY PO) Take 1 capsule by mouth daily.   spironolactone (ALDACTONE) 25 MG tablet TAKE 1 & 1/2 TABLETS BY MOUTH DAILY   fluconazole (DIFLUCAN) 150 MG tablet TAKE ONE TABLET BY MOUTH NOW REPEAT IN 3 DAYS IF NOT BETTER (Patient not taking: No sig reported)   [DISCONTINUED] COVID-19 mRNA Vac-TriS, Pfizer, SUSP injection Inject into the muscle.   No facility-administered encounter medications on file as of 11/07/2020.    Allergies (verified) Codeine, Penicillins, Sulfa antibiotics, and Vicodin [hydrocodone-acetaminophen]   History: Past Medical History:  Diagnosis Date   Arthritis    "both knees" (07/02/2015)   Cancer of right breast (HCC)    Claustrophobia    Dysrhythmia    HX OF FAST HEART RATE AND PALPITATIONS - METOPROLOL HAS HELPED   GERD (gastroesophageal reflux disease)    H/O hiatal hernia    H/O iritis    LEFT EYE - STATES HER EYE BECOMES RED AND VERY SENSITIVE TO LIGHT WHEN FLARE UP OF IRITIS   Heart murmur    "benign; 06/2015"   High cholesterol    History of blood transfusion    "when I had my  partial hysterectomy"   History of chickenpox 09/26/2014   History of measles    History of MRSA infection    History of shingles    Hypertension    Hypothyroidism    Medicare annual wellness visit, initial 04/07/2015   Multinodular thyroid    PT HAVING TROUBLE SWALLOWING   Neuropathy    FEET   Primary localized osteoarthritis of right knee    Sciatica of right side 04/25/2015   Spinal headache    spinal headache with epidural   Total knee replacement status, right    Type II diabetes mellitus ([place])    ORAL MEDICATION - NO INSULIN   Wears glasses    Past Surgical History:  Procedure Laterality Date   ABDOMINAL HYSTERECTOMY  1988   ABDOMINOPLASTY  1986   ANTERIOR AND POSTERIOR REPAIR  05/11/2012   Procedure: ANTERIOR (CYSTOCELE) AND POSTERIOR REPAIR (  RECTOCELE);  Surgeon: Reece Packer, MD;  Location: [facility];  Service: Urology;;  with graft   BREAST BIOPSY Right    CARDIAC CATHETERIZATION  1999   COLONOSCOPY     CYSTOSCOPY WITH URETHRAL DILATATION  05/11/2012   Procedure: CYSTOSCOPY WITH URETHRAL DILATATION;  Surgeon: Reece Packer, MD;  Location: [facility];  Service: Urology;;   ESOPHAGOGASTRODUODENOSCOPY     HERNIA REPAIR     MASTECTOMY Right 2007   PLANTAR FASCIA SURGERY Left    THYROIDECTOMY N/A 02/23/2014   Procedure: TOTAL THYROIDECTOMY;  Surgeon: Armandina Gemma, MD;  Location: [facility];  Service: General;  Laterality: N/A;   TOTAL KNEE ARTHROPLASTY Right 07/02/2015   TOTAL KNEE ARTHROPLASTY Right 07/02/2015   Procedure: TOTAL KNEE ARTHROPLASTY;  Surgeon: Elsie Saas, MD;  Location: [facility];  Service: Orthopedics;  Laterality: Right;   TOTAL THYROIDECTOMY  1976   removed three tumor (2 on right; 1 on left)   TUBAL LIGATION     UMBILICAL HERNIA REPAIR  1986   VAGINAL PROLAPSE REPAIR  05/11/2012   Procedure: VAGINAL VAULT SUSPENSION;  Surgeon: Reece Packer, MD;  Location: [facility];  Service: Urology;;   VULVA / PERINEUM BIOPSY     vulvar   Family History  Problem  Relation Age of Onset   Hypertension Mother    Heart disease Mother        pacer, CHF   Hyperlipidemia Mother    Kidney disease Mother        1 kidney due to stone   Kidney Stones Mother    Hypertension Father    Diabetes Father    Alcohol abuse Father    Lung cancer Father        lung, psa   Diabetes Sister    Hypertension Sister    Breast cancer Sister    Diabetes Brother    Hypertension Brother    Hyperlipidemia Brother    Kidney disease Brother        dialysis   Stroke Brother    Hypertension Maternal Grandmother    Heart disease Maternal Grandmother    Stroke Maternal Grandmother    Hypertension Maternal Grandfather    Hypertension Daughter    Arthritis Son    Hyperlipidemia Sister    Hypertension Sister    Diabetes Sister    Thyroid disease Sister    Kidney disease Sister    Endometriosis Sister    Breast cancer Sister        thyroid and breast   Thyroid cancer Sister    Arthritis Sister        back pain   Hypertension Sister    Thyroid disease Sister    Hypertension Brother    Hyperlipidemia Brother    Hypertension Brother    Hyperlipidemia Brother    Benign prostatic hyperplasia Brother    Hypertension Daughter    Hyperlipidemia Daughter    Diabetes Daughter    Hypertension Maternal Aunt    Hypertension Maternal Uncle    Hypertension Paternal Aunt    Cancer Paternal Aunt    Hypertension Paternal Uncle    Colon cancer Neg Hx    Stomach cancer Neg Hx    Esophageal cancer Neg Hx    Pancreatic cancer Neg Hx    Liver disease Neg Hx    Rectal cancer Neg Hx    Inflammatory bowel disease Neg Hx    Social History   Socioeconomic History   Marital status: Married    Spouse  name: Not on file   Number of children: 3   Years of education: Not on file   Highest education level: Not on file  Occupational History    Comment: Retired  Tobacco Use   Smoking status: Never   Smokeless tobacco: Never  Vaping Use   Vaping Use: Never used  Substance and  Sexual Activity   Alcohol use: No    Alcohol/week: 0.0 standard drinks   Drug use: No   Sexual activity: Not Currently    Birth control/protection: Surgical    Comment: lives with husband, retired from various jobs, diabetic diet  Other Topics Concern   Not on file  Social History Narrative   Not on file   Social Determinants of Health   Financial Resource Strain: Low Risk    Difficulty of Paying Living Expenses: Not hard at all  Food Insecurity: No Food Insecurity   Worried About Charity fundraiser in the Last Year: Never true   Arboriculturist in the Last Year: Never true  Transportation Needs: No Transportation Needs   Lack of Transportation (Medical): No   Lack of Transportation (Non-Medical): No  Physical Activity: Insufficiently Active   Days of Exercise per Week: 2 days   Minutes of Exercise per Session: 20 min  Stress: No Stress Concern Present   Feeling of Stress : Not at all  Social Connections: Moderately Integrated   Frequency of Communication with Friends and Family: More than three times a week   Frequency of Social Gatherings with Friends and Family: More than three times a week   Attends Religious Services: More than 4 times per year   Active Member of [employer] or Organizations: No   Attends Music therapist: Never   Marital Status: Married    Tobacco Counseling Counseling given: Not Answered   Clinical Intake:  Pre-visit preparation completed: Yes  Pain : No/denies pain     Nutritional Status: BMI 25 -29 Overweight Nutritional Risks: None Diabetes: Yes CBG done?: No Did pt. bring in CBG monitor from home?: No (phone visit)  How often do you need to have someone help you when you read instructions, pamphlets, or other written materials from your doctor or pharmacy?: 1 - Never  Diabetes:  Is the patient diabetic?  Yes  If diabetic, was a CBG obtained today?  No  Did the patient bring in their glucometer from home?  No phone  visit How often do you monitor your CBG's? daily.   Financial Strains and Diabetes Management:  Are you having any financial strains with the device, your supplies or your medication? No .  Does the patient want to be seen by Chronic Care Management for management of their diabetes?  No  Would the patient like to be referred to a Nutritionist or for Diabetic Management?  No   Diabetic Exams:  Diabetic Eye Exam: Completed 11/28/2019.   Diabetic Foot Exam: Pt has been advised about the importance in completing this exam.  To be completed by PCP .    Interpreter Needed?: No  Information entered by :: Caroleen Hamman LPN   Activities of Daily Living In your present state of health, do you have any difficulty performing the following activities: 11/07/2020 05/28/2020  Hearing? N N  Vision? N N  Difficulty concentrating or making decisions? N N  Walking or climbing stairs? N N  Dressing or bathing? N N  Doing errands, shopping? N N  Preparing Food and eating ? N -  Using the Toilet? N -  In the past six months, have you accidently leaked urine? N -  Do you have problems with loss of bowel control? N -  Managing your Medications? N -  Managing your Finances? N -  Housekeeping or managing your Housekeeping? N -  Some recent data might be hidden    Patient Care Team: Mosie Lukes, MD as PCP - General (Family Medicine) Juanita Craver, MD as Consulting Physician (Gastroenterology) Seldomridge, Lois Huxley, MD (Ophthalmology) Sheard, Briscoe Burns, DPM (Inactive) as Consulting Physician (Podiatry) Iona Beard, OD as Referring Physician (Optometry) Charlies Silvers, MD as Consulting Physician (Allergy and Immunology) Lavonia Drafts, MD as Consulting Physician (Obstetrics and Gynecology) Elsie Saas, MD as Consulting Physician (Orthopedic Surgery)  Indicate any recent Medical Services you may have received from other than [facility] providers in the past year (date may be  approximate).     Assessment:   This is a routine wellness examination for [facility].  Hearing/Vision screen Hearing Screening - Comments:: No issues Vision Screening - Comments:: Last eye exam-11/2019-Dr. Mendel Ryder  Dietary issues and exercise activities discussed: Current Exercise Habits: Home exercise routine, Type of exercise: Other - see comments (stationary bike), Time (Minutes): 20, Frequency (Times/Week): 2, Weekly Exercise (Minutes/Week): 40, Intensity: Mild, Exercise limited by: None identified   Goals Addressed             This Visit's Progress    Increase physical activity   Not on track      Depression Screen PHQ 2/9 Scores 11/07/2020 05/28/2020 03/21/2019 05/17/2018 03/11/2018 03/10/2017 02/25/2016  PHQ - 2 Score 0 0 0 0 0 0 0  PHQ- 9 Score - 1 - - - - -    Fall Risk Fall Risk  11/07/2020 05/28/2020 03/21/2019 05/17/2018 03/11/2018  Falls in the past year? 0 0 0 0 0  Number falls in past yr: 0 0 0 - -  Injury with Fall? 0 0 0 - -  Follow up Falls prevention discussed - Education provided;Falls prevention discussed - -    FALL RISK PREVENTION PERTAINING TO THE HOME:  Any stairs in or around the home? Yes  If so, are there any without handrails? No  Home free of loose throw rugs in walkways, pet beds, electrical cords, etc? Yes  Adequate lighting in your home to reduce risk of falls? Yes   ASSISTIVE DEVICES UTILIZED TO PREVENT FALLS:  Life alert? No  Use of a cane, walker or w/c? No  Grab bars in the bathroom? Yes  Shower chair or bench in shower? No  Elevated toilet seat or a handicapped toilet? No   TIMED UP AND GO:  Was the test performed? No . Phone visit   Cognitive Function:Normal cognitive status assessed by this Nurse Health Advisor. No abnormalities found.   MMSE - Mini Mental State Exam 03/10/2017 02/25/2016 02/22/2015  Orientation to time 5 5 5   Orientation to Place 5 5 5   Registration 3 3 3   Attention/ Calculation 5 5 5   Recall 3 3 1    Language- name 2 objects 2 2 2   Language- repeat 1 1 1   Language- follow 3 step command 3 3 3   Language- read & follow direction 1 1 1   Write a sentence 1 1 1   Copy design 1 0 1  Total score 30 29 28         Immunizations Immunization History  Administered Date(s) Administered   Fluad Quad(high Dose 65+) 12/17/2018   Influenza Whole 01/28/2017  Influenza, High Dose Seasonal PF 01/21/2016, 02/04/2017, 01/28/2018   Influenza,inj,Quad PF,6+ Mos 01/04/2013, 01/23/2014, 12/28/2014   PFIZER Comirnaty(Gray Top)Covid-19 Tri-Sucrose Vaccine 10/26/2020   PFIZER(Purple Top)SARS-COV-2 Vaccination 06/12/2019, 07/06/2019, 02/06/2020   Pneumococcal Conjugate-13 12/28/2014   Pneumococcal Polysaccharide-23 05/12/2012, 10/27/2017   Tdap 05/02/2014   Zoster Recombinat (Shingrix) 01/15/2018, 03/25/2018    TDAP status: Up to date  Flu Vaccine status: Up to date  Pneumococcal vaccine status: Up to date  Covid-19 vaccine status: Completed vaccines  Qualifies for Shingles Vaccine? No   Zostavax completed No   Shingrix Completed?: Yes  Screening Tests Health Maintenance  Topic Date Due   FOOT EXAM  10/28/2018   INFLUENZA VACCINE  11/19/2020   MAMMOGRAM  11/23/2020   HEMOGLOBIN A1C  11/25/2020   OPHTHALMOLOGY EXAM  11/27/2020   COVID-19 Vaccine (5 - Booster for Pfizer series) 02/26/2021   COLONOSCOPY (Pts 45-46yrs Insurance coverage will need to be confirmed)  02/14/2023   TETANUS/TDAP  05/02/2024   DEXA SCAN  Completed   Hepatitis C Screening  Completed   PNA vac Low Risk Adult  Completed   Zoster Vaccines- Shingrix  Completed   HPV VACCINES  Aged Out    Health Maintenance  Health Maintenance Due  Topic Date Due   FOOT EXAM  10/28/2018    Colorectal cancer screening: Type of screening: Colonoscopy. Completed 02/14/2020. Repeat every 3 years  Mammogram status: Completed Bilateral 11/24/2019. Repeat every year  Bone Density status: Completed 03/31/2019. Results reflect: Bone  density results: OSTEOPENIA. Repeat every 2 years.  Lung Cancer Screening: (Low Dose CT Chest recommended if Age 30-80 years, 30 pack-year currently smoking OR have quit w/in 15years.) does not qualify.     Additional Screening:  Hepatitis C Screening: Completed 02/22/2015  Vision Screening: Recommended annual ophthalmology exams for early detection of glaucoma and other disorders of the eye. Is the patient up to date with their annual eye exam?  Yes  Who is the provider or what is the name of the office in which the patient attends annual eye exams? Dr. Mendel Ryder   Dental Screening: Recommended annual dental exams for proper oral hygiene  Community Resource Referral / Chronic Care Management: CRR required this visit?  No   CCM required this visit?  No      Plan:     I have personally reviewed and noted the following in the patient's chart:   Medical and social history Use of alcohol, tobacco or illicit drugs  Current medications and supplements including opioid prescriptions.  Functional ability and status Nutritional status Physical activity Advanced directives List of other physicians Hospitalizations, surgeries, and ER visits in previous 12 months Vitals Screenings to include cognitive, depression, and falls Referrals and appointments  In addition, I have reviewed and discussed with patient certain preventive protocols, quality metrics, and best practice recommendations. A written personalized care plan for preventive services as well as general preventive health recommendations were provided to patient.   Due to this being a telephonic visit, the after visit summary with patients personalized plan was offered to patient via mail or my-chart. Patient would like to access on my-chart.    Marta Antu, LPN   05/05/5206  Nurse Health Advisor  Nurse Notes: None

## 2020-11-20 ENCOUNTER — Other Ambulatory Visit (HOSPITAL_BASED_OUTPATIENT_CLINIC_OR_DEPARTMENT_OTHER): Payer: Self-pay

## 2020-11-20 ENCOUNTER — Other Ambulatory Visit: Payer: Self-pay | Admitting: Family Medicine

## 2020-11-20 MED ORDER — AVAPRO 75 MG PO TABS
75.0000 mg | ORAL_TABLET | Freq: Every day | ORAL | 1 refills | Status: DC
  Filled 2020-11-20: qty 90, 90d supply, fill #0
  Filled 2021-02-18: qty 90, 90d supply, fill #1

## 2020-11-20 MED FILL — Cyclosporine (Ophth) Emulsion 0.05%: OPHTHALMIC | 90 days supply | Qty: 180 | Fill #1 | Status: AC

## 2020-11-20 MED FILL — Mometasone Furoate Nasal Susp 50 MCG/ACT: NASAL | 90 days supply | Qty: 51 | Fill #1 | Status: AC

## 2020-11-21 ENCOUNTER — Other Ambulatory Visit (HOSPITAL_BASED_OUTPATIENT_CLINIC_OR_DEPARTMENT_OTHER): Payer: Self-pay

## 2020-11-22 ENCOUNTER — Other Ambulatory Visit (HOSPITAL_BASED_OUTPATIENT_CLINIC_OR_DEPARTMENT_OTHER): Payer: Self-pay

## 2020-11-23 ENCOUNTER — Other Ambulatory Visit (HOSPITAL_BASED_OUTPATIENT_CLINIC_OR_DEPARTMENT_OTHER): Payer: Self-pay

## 2020-11-26 ENCOUNTER — Other Ambulatory Visit (HOSPITAL_BASED_OUTPATIENT_CLINIC_OR_DEPARTMENT_OTHER): Payer: Self-pay

## 2020-11-27 ENCOUNTER — Ambulatory Visit: Payer: Medicare Other | Admitting: Family Medicine

## 2020-11-27 DIAGNOSIS — E119 Type 2 diabetes mellitus without complications: Secondary | ICD-10-CM | POA: Diagnosis not present

## 2020-11-27 LAB — HM DIABETES EYE EXAM

## 2020-11-28 DIAGNOSIS — Z1231 Encounter for screening mammogram for malignant neoplasm of breast: Secondary | ICD-10-CM | POA: Diagnosis not present

## 2020-11-28 LAB — HM MAMMOGRAPHY

## 2020-11-29 ENCOUNTER — Ambulatory Visit: Payer: Medicare Other | Admitting: Family Medicine

## 2020-11-30 ENCOUNTER — Ambulatory Visit (INDEPENDENT_AMBULATORY_CARE_PROVIDER_SITE_OTHER): Payer: Medicare Other | Admitting: Family Medicine

## 2020-11-30 ENCOUNTER — Other Ambulatory Visit: Payer: Self-pay

## 2020-11-30 ENCOUNTER — Encounter: Payer: Self-pay | Admitting: Family Medicine

## 2020-11-30 VITALS — BP 112/70 | HR 71 | Temp 98.1°F | Resp 16 | Wt 169.5 lb

## 2020-11-30 DIAGNOSIS — E039 Hypothyroidism, unspecified: Secondary | ICD-10-CM

## 2020-11-30 DIAGNOSIS — I10 Essential (primary) hypertension: Secondary | ICD-10-CM | POA: Diagnosis not present

## 2020-11-30 DIAGNOSIS — E782 Mixed hyperlipidemia: Secondary | ICD-10-CM

## 2020-11-30 DIAGNOSIS — E119 Type 2 diabetes mellitus without complications: Secondary | ICD-10-CM | POA: Diagnosis not present

## 2020-11-30 DIAGNOSIS — M25562 Pain in left knee: Secondary | ICD-10-CM | POA: Diagnosis not present

## 2020-11-30 DIAGNOSIS — K219 Gastro-esophageal reflux disease without esophagitis: Secondary | ICD-10-CM

## 2020-11-30 DIAGNOSIS — E1140 Type 2 diabetes mellitus with diabetic neuropathy, unspecified: Secondary | ICD-10-CM | POA: Diagnosis not present

## 2020-11-30 DIAGNOSIS — G8929 Other chronic pain: Secondary | ICD-10-CM

## 2020-11-30 DIAGNOSIS — E079 Disorder of thyroid, unspecified: Secondary | ICD-10-CM | POA: Diagnosis not present

## 2020-11-30 LAB — COMPREHENSIVE METABOLIC PANEL
ALT: 20 U/L (ref 0–35)
AST: 18 U/L (ref 0–37)
Albumin: 4.5 g/dL (ref 3.5–5.2)
Alkaline Phosphatase: 63 U/L (ref 39–117)
BUN: 15 mg/dL (ref 6–23)
CO2: 28 mEq/L (ref 19–32)
Calcium: 9.7 mg/dL (ref 8.4–10.5)
Chloride: 101 mEq/L (ref 96–112)
Creatinine, Ser: 0.83 mg/dL (ref 0.40–1.20)
GFR: 71.12 mL/min (ref >60.00)
Glucose, Bld: 89 mg/dL (ref 70–99)
Potassium: 4.7 mEq/L (ref 3.5–5.1)
Sodium: 139 mEq/L (ref 135–145)
Total Bilirubin: 0.5 mg/dL (ref 0.2–1.2)
Total Protein: 6.8 g/dL (ref 6.0–8.3)

## 2020-11-30 LAB — LIPID PANEL
Cholesterol: 162 mg/dL (ref 0–200)
HDL: 49.7 mg/dL (ref >39.00)
LDL Cholesterol: 83 mg/dL (ref 0–99)
NonHDL: 112.61
Total CHOL/HDL Ratio: 3
Triglycerides: 148 mg/dL (ref 0.0–149.0)
VLDL: 29.6 mg/dL (ref 0.0–40.0)

## 2020-11-30 LAB — CBC
HCT: 39.8 % (ref 36.0–46.0)
Hemoglobin: 13.1 g/dL (ref 12.0–15.0)
MCHC: 32.8 g/dL (ref 30.0–36.0)
MCV: 98.7 fl (ref 78.0–100.0)
Platelets: 285 10*3/uL (ref 150.0–400.0)
RBC: 4.03 Mil/uL (ref 3.87–5.11)
RDW: 12.3 % (ref 11.5–15.5)
WBC: 6.5 10*3/uL (ref 4.0–10.5)

## 2020-11-30 LAB — HEMOGLOBIN A1C: Hgb A1c MFr Bld: 6.7 % — ABNORMAL HIGH (ref 4.6–6.5)

## 2020-11-30 LAB — TSH: TSH: 0.32 u[IU]/mL — ABNORMAL LOW (ref 0.35–5.50)

## 2020-11-30 NOTE — Patient Instructions (Addendum)
Bone density shows osteopenia, which is thinner than normal but not as bad as osteoporosis. Recommend calcium intake of 1200 to 1500 mg daily, divided into roughly 3 doses. Best source is the diet and a single dairy serving is about 500 mg, a supplement of calcium citrate once or twice daily to balance diet is fine if not getting enough in diet. Also need Vitamin D 2000 IU caps, 1 cap daily if not already taking vitamin D. Also recommend weight baring exercise on hips and upper body to keep bones strong   [place] swiss cheese for bone health  Call the podiatrist for foot

## 2020-12-02 NOTE — Assessment & Plan Note (Signed)
She is waiting to have her TKR until her daughter has had her second one but she has decided to proceed when she is able. Continue to stay as active as able and use topical treatments

## 2020-12-02 NOTE — Assessment & Plan Note (Signed)
Well controlled, no changes to meds. Encouraged heart healthy diet such as the DASH diet and exercise as tolerated.  °

## 2020-12-02 NOTE — Progress Notes (Signed)
Subjective:    Patient ID: Victoria Walker, female    DOB: 08-Oct-1950, 70 y.o.   MRN: [id number]  Chief Complaint  Patient presents with   6 months follow up     HPI Patient is in today for follow up on chronic medical concerns. No recent febrile illness or hospitalizations. She has been helping her daughter as she recovers from TKR. She is putting off her own surgery until her daughter gets her second knee done. No c/o polyuria or polydipsia. She is trying to eat well and eat a heart healthy diet. Denies CP/palp/SOB/HA/congestion/fevers/GI or GU c/o. Taking meds as prescribed   Past Medical History:  Diagnosis Date   Arthritis    "both knees" (07/02/2015)   Cancer of right breast (HCC)    Claustrophobia    Dysrhythmia    HX OF FAST HEART RATE AND PALPITATIONS - METOPROLOL HAS HELPED   GERD (gastroesophageal reflux disease)    H/O hiatal hernia    H/O iritis    LEFT EYE - STATES HER EYE BECOMES RED AND VERY SENSITIVE TO LIGHT WHEN FLARE UP OF IRITIS   Heart murmur    "benign; 06/2015"   High cholesterol    History of blood transfusion    "when I had my partial hysterectomy"   History of chickenpox 09/26/2014   History of measles    History of MRSA infection    History of shingles    Hypertension    Hypothyroidism    Medicare annual wellness visit, initial 04/07/2015   Multinodular thyroid    PT HAVING TROUBLE SWALLOWING   Neuropathy    FEET   Primary localized osteoarthritis of right knee    Sciatica of right side 04/25/2015   Spinal headache    spinal headache with epidural   Total knee replacement status, right    Type II diabetes mellitus ([place])    ORAL MEDICATION - NO INSULIN   Wears glasses     Past Surgical History:  Procedure Laterality Date   ABDOMINAL HYSTERECTOMY  1988   ABDOMINOPLASTY  1986   ANTERIOR AND POSTERIOR REPAIR  05/11/2012   Procedure: ANTERIOR (CYSTOCELE) AND POSTERIOR REPAIR (RECTOCELE);  Surgeon: Reece Packer, MD;  Location: [facility];   Service: Urology;;  with graft   BREAST BIOPSY Right    CARDIAC CATHETERIZATION  1999   COLONOSCOPY     CYSTOSCOPY WITH URETHRAL DILATATION  05/11/2012   Procedure: CYSTOSCOPY WITH URETHRAL DILATATION;  Surgeon: Reece Packer, MD;  Location: [facility];  Service: Urology;;   ESOPHAGOGASTRODUODENOSCOPY     HERNIA REPAIR     MASTECTOMY Right 2007   PLANTAR FASCIA SURGERY Left    THYROIDECTOMY N/A 02/23/2014   Procedure: TOTAL THYROIDECTOMY;  Surgeon: Armandina Gemma, MD;  Location: [facility];  Service: General;  Laterality: N/A;   TOTAL KNEE ARTHROPLASTY Right 07/02/2015   TOTAL KNEE ARTHROPLASTY Right 07/02/2015   Procedure: TOTAL KNEE ARTHROPLASTY;  Surgeon: Elsie Saas, MD;  Location: [facility];  Service: Orthopedics;  Laterality: Right;   TOTAL THYROIDECTOMY  1976   removed three tumor (2 on right; 1 on left)   TUBAL LIGATION     UMBILICAL HERNIA REPAIR  1986   VAGINAL PROLAPSE REPAIR  05/11/2012   Procedure: VAGINAL VAULT SUSPENSION;  Surgeon: Reece Packer, MD;  Location: [facility];  Service: Urology;;   VULVA / PERINEUM BIOPSY     vulvar    Family History  Problem Relation Age of Onset   Hypertension Mother  Heart disease Mother        pacer, CHF   Hyperlipidemia Mother    Kidney disease Mother        1 kidney due to stone   Kidney Stones Mother    Hypertension Father    Diabetes Father    Alcohol abuse Father    Lung cancer Father        lung, psa   Diabetes Sister    Hypertension Sister    Breast cancer Sister    Diabetes Brother    Hypertension Brother    Hyperlipidemia Brother    Kidney disease Brother        dialysis   Stroke Brother    Hypertension Maternal Grandmother    Heart disease Maternal Grandmother    Stroke Maternal Grandmother    Hypertension Maternal Grandfather    Hypertension Daughter    Arthritis Son    Hyperlipidemia Sister    Hypertension Sister    Diabetes Sister    Thyroid disease Sister    Kidney disease Sister    Endometriosis Sister     Breast cancer Sister        thyroid and breast   Thyroid cancer Sister    Arthritis Sister        back pain   Hypertension Sister    Thyroid disease Sister    Hypertension Brother    Hyperlipidemia Brother    Hypertension Brother    Hyperlipidemia Brother    Benign prostatic hyperplasia Brother    Hypertension Daughter    Hyperlipidemia Daughter    Diabetes Daughter    Hypertension Maternal Aunt    Hypertension Maternal Uncle    Hypertension Paternal Aunt    Cancer Paternal Aunt    Hypertension Paternal Uncle    Colon cancer Neg Hx    Stomach cancer Neg Hx    Esophageal cancer Neg Hx    Pancreatic cancer Neg Hx    Liver disease Neg Hx    Rectal cancer Neg Hx    Inflammatory bowel disease Neg Hx     Social History   Socioeconomic History   Marital status: Married    Spouse name: Not on file   Number of children: 3   Years of education: Not on file   Highest education level: Not on file  Occupational History    Comment: Retired  Tobacco Use   Smoking status: Never   Smokeless tobacco: Never  Vaping Use   Vaping Use: Never used  Substance and Sexual Activity   Alcohol use: No    Alcohol/week: 0.0 standard drinks   Drug use: No   Sexual activity: Not Currently    Birth control/protection: Surgical    Comment: lives with husband, retired from various jobs, diabetic diet  Other Topics Concern   Not on file  Social History Narrative   Not on file   Social Determinants of Health   Financial Resource Strain: Low Risk    Difficulty of Paying Living Expenses: Not hard at all  Food Insecurity: No Food Insecurity   Worried About Charity fundraiser in the Last Year: Never true   Arboriculturist in the Last Year: Never true  Transportation Needs: No Transportation Needs   Lack of Transportation (Medical): No   Lack of Transportation (Non-Medical): No  Physical Activity: Insufficiently Active   Days of Exercise per Week: 2 days   Minutes of Exercise per  Session: 20 min  Stress: No Stress Concern Present  Feeling of Stress : Not at all  Social Connections: Moderately Integrated   Frequency of Communication with Friends and Family: More than three times a week   Frequency of Social Gatherings with Friends and Family: More than three times a week   Attends Religious Services: More than 4 times per year   Active Member of [employer] or Organizations: No   Attends Archivist Meetings: Never   Marital Status: Married  Human resources officer Violence: Not At Risk   Fear of Current or Ex-Partner: No   Emotionally Abused: No   Physically Abused: No   Sexually Abused: No    Outpatient Medications Prior to Visit  Medication Sig Dispense Refill   aspirin 81 MG EC tablet Take 81 mg by mouth every other day. Swallow whole.     atorvastatin (LIPITOR) 40 MG tablet TAKE 1 TABLET BY MOUTH EVERY OTHER DAY, ALTERNATING WITH 1/2 TABLET 180 tablet 1   AVAPRO 75 MG tablet TAKE 1 TABLET (75 MG TOTAL) BY MOUTH DAILY. 90 tablet 1   Calcium Carbonate-Vitamin D (OS-CAL 500 + D PO)      Carboxymethylcellul-Glycerin (REFRESH OPTIVE OP) Apply to eye.     Cholecalciferol (VITAMIN D) 2000 UNITS CAPS Take 1 capsule by mouth every evening.      Ciclopirox 1 % shampoo Apply 1 each topically every Friday.      Ciclopirox 1 % shampoo apply to affected area(s) as directed twice a week 120 mL 11   COVID-19 mRNA vaccine, Pfizer, 30 MCG/0.3ML injection INJECT AS DIRECTED .3 mL 0   cycloSPORINE (RESTASIS) 0.05 % ophthalmic emulsion PLACE 1 DROP INTO EACH EYE TWO TIMES DAILY 180 mL 3   diltiazem (TIAZAC) 360 MG 24 hr capsule TAKE 1 CAPSULE (360 MG TOTAL) BY MOUTH DAILY. 90 capsule 1   Estradiol 10 MCG TABS vaginal tablet INSERT 1 TAB VAGINALLY AT BEDTIME EVERY OTHER DAY 48 tablet 4   gabapentin (NEURONTIN) 300 MG capsule TAKE 1 CAPSULE (300 MG TOTAL) BY MOUTH AT BEDTIME. 90 capsule 1   glucose blood (ONE TOUCH TEST STRIPS) test strip Use as directed twice daily to check blood  sugar. DX code E11.9 300 each 1   influenza vaccine adjuvanted (FLUAD) 0.5 ML injection TAKE AS DIRECTED .5 mL 0   levocetirizine (XYZAL) 5 MG tablet TAKE 1 TABLET (5 MG TOTAL) BY MOUTH EVERY EVENING. 90 tablet 3   levothyroxine (SYNTHROID) 88 MCG tablet TAKE 1 TABLET BY MOUTH ONCE DAILY **SKIP SUNDAY** 90 tablet 1   LOTEMAX 0.5 % ophthalmic suspension PLACE 1 DROP 4 TIMES A DAY FOR 1 WEEK, THEN 3 TIMES A DAY FOR 1 WEEK, THEN 2 TIMES A DAY FOR 1 WEEK, THEN 1 TIME A DAY FOR 1 WK, THEN STOP IN AFFECTED EYE 5 mL 1   loteprednol (LOTEMAX) 0.5 % ophthalmic suspension Place one drop into both eyes four times daily with flare up. 10 mL 3   metFORMIN (GLUCOPHAGE-XR) 750 MG 24 hr tablet TAKE 1 TABLET (750 MG TOTAL) BY MOUTH IN THE MORNING AND AT BEDTIME. 180 tablet 1   metoprolol succinate (TOPROL-XL) 25 MG 24 hr tablet TAKE 1 TABLET BY MOUTH EVERY MORNING AND EVERY NIGHT AT BEDTIME 180 tablet 1   mometasone (NASONEX) 50 MCG/ACT nasal spray USE ONE SPRAY IN EACH NOSTRIL TWICE DAILY IF NEEDED FOR STUFFY NOSE 51 g 3   NEXIUM 40 MG capsule TAKE 1 CAPSULE BY MOUTH ONCE DAILY 90 capsule 1   Probiotic Product (PROBIOTIC DAILY PO) Take  1 capsule by mouth daily.     spironolactone (ALDACTONE) 25 MG tablet TAKE 1 & 1/2 TABLETS BY MOUTH DAILY 135 tablet 1   clindamycin (CLEOCIN) 300 MG capsule TAKE 2 CAPSULES BY MOUTH 1 HR PRIOR TO DENTAL WORK (Patient not taking: Reported on 11/30/2020)     fluconazole (DIFLUCAN) 150 MG tablet TAKE ONE TABLET BY MOUTH NOW REPEAT IN 3 DAYS IF NOT BETTER (Patient not taking: No sig reported) 2 tablet 0   No facility-administered medications prior to visit.    Allergies  Allergen Reactions   Codeine Anxiety    Jittery    Penicillins Itching   Sulfa Antibiotics Itching   Vicodin [Hydrocodone-Acetaminophen] Itching    ITCHING     Review of Systems  Constitutional:  Negative for fever and malaise/fatigue.  HENT:  Negative for congestion.   Eyes:  Negative for blurred vision.   Respiratory:  Negative for shortness of breath.   Cardiovascular:  Negative for chest pain, palpitations and leg swelling.  Gastrointestinal:  Negative for abdominal pain, blood in stool and nausea.  Genitourinary:  Negative for dysuria and frequency.  Musculoskeletal:  Positive for joint pain. Negative for falls.  Skin:  Negative for rash.  Neurological:  Negative for dizziness, loss of consciousness and headaches.  Endo/Heme/Allergies:  Negative for environmental allergies.  Psychiatric/Behavioral:  Negative for depression. The patient is not nervous/anxious.       Objective:    Physical Exam Constitutional:      General: She is not in acute distress.    Appearance: She is well-developed.  HENT:     Head: Normocephalic and atraumatic.  Eyes:     Conjunctiva/sclera: Conjunctivae normal.  Neck:     Thyroid: No thyromegaly.  Cardiovascular:     Rate and Rhythm: Normal rate and regular rhythm.     Heart sounds: Normal heart sounds. No murmur heard. Pulmonary:     Effort: Pulmonary effort is normal. No respiratory distress.     Breath sounds: Normal breath sounds.  Abdominal:     General: Bowel sounds are normal. There is no distension.     Palpations: Abdomen is soft. There is no mass.     Tenderness: There is no abdominal tenderness.  Musculoskeletal:     Cervical back: Neck supple.  Lymphadenopathy:     Cervical: No cervical adenopathy.  Skin:    General: Skin is warm and dry.  Neurological:     Mental Status: She is alert and oriented to person, place, and time.  Psychiatric:        Behavior: Behavior normal.    BP 112/70   Pulse 71   Temp 98.1 F (36.7 C)   Resp 16   Wt 169 lb 8 oz (76.9 kg)   SpO2 99%   BMI 28.21 kg/m  Wt Readings from Last 3 Encounters:  11/30/20 169 lb 8 oz (76.9 kg)  11/07/20 169 lb (76.7 kg)  07/25/20 169 lb (76.7 kg)    Diabetic Foot Exam - Simple   Simple Foot Form Visual Inspection No deformities, no ulcerations, no other  skin breakdown bilaterally: Yes Sensation Testing Intact to touch and monofilament testing bilaterally: Yes Pulse Check Posterior Tibialis and Dorsalis pulse intact bilaterally: Yes Comments    Lab Results  Component Value Date   WBC 6.5 11/30/2020   HGB 13.1 11/30/2020   HCT 39.8 11/30/2020   PLT 285.0 11/30/2020   GLUCOSE 89 11/30/2020   CHOL 162 11/30/2020   TRIG 148.0 11/30/2020  HDL 49.70 11/30/2020   LDLDIRECT 107.0 07/01/2016   LDLCALC 83 11/30/2020   ALT 20 11/30/2020   AST 18 11/30/2020   NA 139 11/30/2020   K 4.7 11/30/2020   CL 101 11/30/2020   CREATININE 0.83 11/30/2020   BUN 15 11/30/2020   CO2 28 11/30/2020   TSH 0.32 (L) 11/30/2020   INR 1.01 06/21/2015   HGBA1C 6.7 (H) 11/30/2020   MICROALBUR <0.7 08/02/2018    Lab Results  Component Value Date   TSH 0.32 (L) 11/30/2020   Lab Results  Component Value Date   WBC 6.5 11/30/2020   HGB 13.1 11/30/2020   HCT 39.8 11/30/2020   MCV 98.7 11/30/2020   PLT 285.0 11/30/2020   Lab Results  Component Value Date   NA 139 11/30/2020   K 4.7 11/30/2020   CO2 28 11/30/2020   GLUCOSE 89 11/30/2020   BUN 15 11/30/2020   CREATININE 0.83 11/30/2020   BILITOT 0.5 11/30/2020   ALKPHOS 63 11/30/2020   AST 18 11/30/2020   ALT 20 11/30/2020   PROT 6.8 11/30/2020   ALBUMIN 4.5 11/30/2020   CALCIUM 9.7 11/30/2020   ANIONGAP 9 07/04/2015   GFR 71.12 11/30/2020   Lab Results  Component Value Date   CHOL 162 11/30/2020   Lab Results  Component Value Date   HDL 49.70 11/30/2020   Lab Results  Component Value Date   LDLCALC 83 11/30/2020   Lab Results  Component Value Date   TRIG 148.0 11/30/2020   Lab Results  Component Value Date   CHOLHDL 3 11/30/2020   Lab Results  Component Value Date   HGBA1C 6.7 (H) 11/30/2020       Assessment & Plan:   Problem List Items Addressed This Visit     Type 2 diabetes mellitus with diabetic neuropathy, without long-term current use of insulin ([place]) -  Primary   Relevant Orders   Hemoglobin A1c (Completed)   Essential hypertension    Well controlled, no changes to meds. Encouraged heart healthy diet such as the DASH diet and exercise as tolerated.       Relevant Orders   CBC (Completed)   Comprehensive metabolic panel (Completed)   TSH (Completed)   Hyperlipidemia, mixed    Encourage heart healthy diet such as MIND or DASH diet, increase exercise, avoid trans fats, simple carbohydrates and processed foods, consider a krill or fish or flaxseed oil cap daily. Tolerating Atorvastatin      Relevant Orders   Lipid panel (Completed)   Hypothyroid   Relevant Orders   TSH (Completed)   Disease of thyroid gland    TSH mildly suppressed asymptomatic. Will order further labs and monitor      Left knee pain    She is waiting to have her TKR until her daughter has had her second one but she has decided to proceed when she is able. Continue to stay as active as able and use topical treatments      Relevant Orders   Ambulatory referral to Orthopedic Surgery   Diabetes mellitus without complication ([place])    [id number] acceptable, minimize simple carbs. Increase exercise as tolerated. Continue current meds      Gastroesophageal reflux disease    I am having Victoria Walker maintain her Ciclopirox, Vitamin D, Probiotic Product (PROBIOTIC DAILY PO), aspirin, glucose blood, Calcium Carbonate-Vitamin D (OS-CAL 500 + D PO), clindamycin, Carboxymethylcellul-Glycerin (REFRESH OPTIVE OP), Estradiol, metFORMIN, mometasone, Lotemax, fluconazole, cycloSPORINE, levocetirizine, atorvastatin, influenza vaccine adjuvanted, COVID-19 mRNA vaccine (  Pfizer), levothyroxine, metoprolol succinate, diltiazem, loteprednol, gabapentin, spironolactone, NexIUM, Ciclopirox, and Avapro.  No orders of the defined types were placed in this encounter.    Penni Homans, MD

## 2020-12-02 NOTE — Assessment & Plan Note (Signed)
TSH mildly suppressed asymptomatic. Will order further labs and monitor

## 2020-12-02 NOTE — Assessment & Plan Note (Signed)
hgba1c acceptable, minimize simple carbs. Increase exercise as tolerated. Continue current meds 

## 2020-12-02 NOTE — Assessment & Plan Note (Addendum)
Encourage heart healthy diet such as MIND or DASH diet, increase exercise, avoid trans fats, simple carbohydrates and processed foods, consider a krill or fish or flaxseed oil cap daily. Tolerating Atorvastatin 

## 2020-12-03 ENCOUNTER — Encounter: Payer: Self-pay | Admitting: *Deleted

## 2020-12-03 ENCOUNTER — Other Ambulatory Visit: Payer: Medicare Other

## 2020-12-03 ENCOUNTER — Telehealth: Payer: Self-pay | Admitting: Family Medicine

## 2020-12-03 ENCOUNTER — Other Ambulatory Visit: Payer: Self-pay | Admitting: *Deleted

## 2020-12-03 ENCOUNTER — Encounter: Payer: Self-pay | Admitting: General Practice

## 2020-12-03 DIAGNOSIS — R7989 Other specified abnormal findings of blood chemistry: Secondary | ICD-10-CM

## 2020-12-03 DIAGNOSIS — E039 Hypothyroidism, unspecified: Secondary | ICD-10-CM | POA: Diagnosis not present

## 2020-12-03 NOTE — Telephone Encounter (Signed)
Patient would like someone to call her back to answer some questions she has regarding the mychart information. She has questions about her thyroid labs, and also has questions about the other testing she's supposed to do. Please advice

## 2020-12-03 NOTE — Telephone Encounter (Signed)
LVM to call back.

## 2020-12-04 ENCOUNTER — Ambulatory Visit (INDEPENDENT_AMBULATORY_CARE_PROVIDER_SITE_OTHER): Payer: Medicare Other | Admitting: Podiatry

## 2020-12-04 ENCOUNTER — Other Ambulatory Visit: Payer: Self-pay

## 2020-12-04 DIAGNOSIS — M2142 Flat foot [pes planus] (acquired), left foot: Secondary | ICD-10-CM

## 2020-12-04 DIAGNOSIS — M722 Plantar fascial fibromatosis: Secondary | ICD-10-CM | POA: Diagnosis not present

## 2020-12-04 DIAGNOSIS — M2141 Flat foot [pes planus] (acquired), right foot: Secondary | ICD-10-CM | POA: Diagnosis not present

## 2020-12-04 DIAGNOSIS — E1140 Type 2 diabetes mellitus with diabetic neuropathy, unspecified: Secondary | ICD-10-CM | POA: Diagnosis not present

## 2020-12-04 LAB — THYROID PEROXIDASE ANTIBODY: Thyroperoxidase Ab SerPl-aCnc: 1 IU/mL (ref <9)

## 2020-12-04 LAB — T4, FREE: Free T4: 1.4 ng/dL (ref 0.8–1.8)

## 2020-12-06 NOTE — Progress Notes (Signed)
  Subjective:  Patient ID: Victoria Walker, female    DOB: Sep 10, 1950,  MRN: [id number]  Chief Complaint  Patient presents with   Diabetes    diabetic foot exam (also time for new diabetic shoes) A1C  6.7   Peripheral Neuropathy    70 y.o. female returns with the above complaint. History confirmed with patient.  Heel pain has improved her neuropathy has not worsened much.  She needs new diabetic shoes and inserts  Objective:  Physical Exam: warm, good capillary refill, no trophic changes or ulcerative lesions, normal DP and PT pulses and somewhat reduced monofilament exam, she is inconsistent in the usual pulps of the toes Left Foot: No pain on palpation of the plantar fascia today, digital contractures present, flexible pes planus present Right Foot: Digital contractures present, flexible pes planus present   Assessment:   1. Pes planus of both feet   2. Type 2 diabetes mellitus with diabetic neuropathy, without long-term current use of insulin ([place])   3. Plantar fasciitis       Plan:  Patient was evaluated and treated and all questions answered.  Patient educated on diabetes. Discussed proper diabetic foot care and discussed risks and complications of disease. Educated patient in depth on reasons to return to the office immediately should he/she discover anything concerning or new on the feet. All questions answered. Discussed proper shoes as well.  -Appointment will be made for diabetic shoe fitting.  Her current custom orthotics are well fitting and will be refurbished whenever they need to be.    Return in about 1 year (around 12/04/2021).   Lanae Crumbly, DPM 12/06/2020

## 2020-12-07 ENCOUNTER — Other Ambulatory Visit: Payer: Self-pay

## 2020-12-07 ENCOUNTER — Other Ambulatory Visit: Payer: Medicare Other

## 2020-12-09 ENCOUNTER — Other Ambulatory Visit: Payer: Self-pay | Admitting: Family Medicine

## 2020-12-09 MED FILL — Levocetirizine Dihydrochloride Tab 5 MG: ORAL | 90 days supply | Qty: 90 | Fill #1 | Status: AC

## 2020-12-09 MED FILL — Atorvastatin Calcium Tab 40 MG (Base Equivalent): ORAL | 84 days supply | Qty: 66 | Fill #1 | Status: AC

## 2020-12-10 ENCOUNTER — Other Ambulatory Visit (HOSPITAL_BASED_OUTPATIENT_CLINIC_OR_DEPARTMENT_OTHER): Payer: Self-pay

## 2020-12-11 ENCOUNTER — Other Ambulatory Visit (HOSPITAL_BASED_OUTPATIENT_CLINIC_OR_DEPARTMENT_OTHER): Payer: Self-pay

## 2020-12-12 ENCOUNTER — Other Ambulatory Visit (HOSPITAL_BASED_OUTPATIENT_CLINIC_OR_DEPARTMENT_OTHER): Payer: Self-pay

## 2020-12-12 MED ORDER — METFORMIN HCL ER 750 MG PO TB24
ORAL_TABLET | ORAL | 1 refills | Status: DC
  Filled 2020-12-12: qty 180, 90d supply, fill #0
  Filled 2021-03-22: qty 180, 90d supply, fill #1

## 2020-12-13 ENCOUNTER — Other Ambulatory Visit (HOSPITAL_BASED_OUTPATIENT_CLINIC_OR_DEPARTMENT_OTHER): Payer: Self-pay

## 2020-12-14 ENCOUNTER — Other Ambulatory Visit (HOSPITAL_COMMUNITY): Payer: Self-pay

## 2020-12-14 ENCOUNTER — Other Ambulatory Visit (HOSPITAL_BASED_OUTPATIENT_CLINIC_OR_DEPARTMENT_OTHER): Payer: Self-pay

## 2020-12-17 ENCOUNTER — Other Ambulatory Visit (HOSPITAL_BASED_OUTPATIENT_CLINIC_OR_DEPARTMENT_OTHER): Payer: Self-pay

## 2020-12-18 ENCOUNTER — Other Ambulatory Visit (HOSPITAL_BASED_OUTPATIENT_CLINIC_OR_DEPARTMENT_OTHER): Payer: Self-pay

## 2020-12-19 ENCOUNTER — Other Ambulatory Visit (HOSPITAL_BASED_OUTPATIENT_CLINIC_OR_DEPARTMENT_OTHER): Payer: Self-pay

## 2020-12-30 DIAGNOSIS — R350 Frequency of micturition: Secondary | ICD-10-CM | POA: Diagnosis not present

## 2020-12-30 DIAGNOSIS — N390 Urinary tract infection, site not specified: Secondary | ICD-10-CM | POA: Diagnosis not present

## 2020-12-30 DIAGNOSIS — R3915 Urgency of urination: Secondary | ICD-10-CM | POA: Diagnosis not present

## 2021-01-08 DIAGNOSIS — C50911 Malignant neoplasm of unspecified site of right female breast: Secondary | ICD-10-CM | POA: Diagnosis not present

## 2021-01-09 DIAGNOSIS — E1140 Type 2 diabetes mellitus with diabetic neuropathy, unspecified: Secondary | ICD-10-CM | POA: Diagnosis not present

## 2021-01-16 DIAGNOSIS — M25562 Pain in left knee: Secondary | ICD-10-CM | POA: Diagnosis not present

## 2021-01-16 DIAGNOSIS — G8929 Other chronic pain: Secondary | ICD-10-CM | POA: Diagnosis not present

## 2021-01-16 DIAGNOSIS — M1712 Unilateral primary osteoarthritis, left knee: Secondary | ICD-10-CM | POA: Diagnosis not present

## 2021-01-16 DIAGNOSIS — M7652 Patellar tendinitis, left knee: Secondary | ICD-10-CM | POA: Diagnosis not present

## 2021-01-17 ENCOUNTER — Telehealth: Payer: Self-pay | Admitting: Family Medicine

## 2021-01-18 ENCOUNTER — Telehealth: Payer: Self-pay | Admitting: Family Medicine

## 2021-01-18 NOTE — Telephone Encounter (Signed)
Fer from [employer] is calling in regards of the detailed order request for a CGM machine faxed over to [place] back on Sept 23rd. She asked for it to be faxed back to them to [phone].

## 2021-01-21 ENCOUNTER — Other Ambulatory Visit (HOSPITAL_BASED_OUTPATIENT_CLINIC_OR_DEPARTMENT_OTHER): Payer: Self-pay

## 2021-01-21 MED ORDER — INFLUENZA VAC A&B SA ADJ QUAD 0.5 ML IM PRSY
PREFILLED_SYRINGE | INTRAMUSCULAR | 0 refills | Status: DC
  Filled 2021-01-21: qty 0.5, 1d supply, fill #0

## 2021-01-21 NOTE — Telephone Encounter (Signed)
Pt called in states that she doesn't want the cgm machine and she will discuss at next visit

## 2021-01-22 DIAGNOSIS — C50911 Malignant neoplasm of unspecified site of right female breast: Secondary | ICD-10-CM | POA: Diagnosis not present

## 2021-01-25 ENCOUNTER — Other Ambulatory Visit (HOSPITAL_BASED_OUTPATIENT_CLINIC_OR_DEPARTMENT_OTHER): Payer: Self-pay

## 2021-01-28 ENCOUNTER — Telehealth: Payer: Self-pay | Admitting: Podiatry

## 2021-01-28 NOTE — Telephone Encounter (Signed)
Diabetic shoes/inserts in..lvm for pt to call to schedule an appt to pick them up. 

## 2021-01-31 ENCOUNTER — Ambulatory Visit (INDEPENDENT_AMBULATORY_CARE_PROVIDER_SITE_OTHER): Payer: Medicare Other | Admitting: *Deleted

## 2021-01-31 ENCOUNTER — Other Ambulatory Visit: Payer: Self-pay

## 2021-01-31 DIAGNOSIS — M2142 Flat foot [pes planus] (acquired), left foot: Secondary | ICD-10-CM | POA: Diagnosis not present

## 2021-01-31 DIAGNOSIS — M2141 Flat foot [pes planus] (acquired), right foot: Secondary | ICD-10-CM

## 2021-01-31 DIAGNOSIS — M2042 Other hammer toe(s) (acquired), left foot: Secondary | ICD-10-CM | POA: Diagnosis not present

## 2021-01-31 DIAGNOSIS — E1140 Type 2 diabetes mellitus with diabetic neuropathy, unspecified: Secondary | ICD-10-CM

## 2021-01-31 NOTE — Progress Notes (Signed)
Patient presents today to pick up diabetic shoes and insoles.  Patient was dispensed 1 pair of diabetic shoes and 3 pairs of foam casted diabetic insoles. Fit was satisfactory. Instructions for break-in and wear was reviewed and a copy was given to the patient.   Re-appointment for regularly scheduled diabetic foot care visits or if they should experience any trouble with the shoes or insoles.  

## 2021-02-07 ENCOUNTER — Encounter: Payer: Self-pay | Admitting: Family Medicine

## 2021-02-07 ENCOUNTER — Other Ambulatory Visit: Payer: Self-pay

## 2021-02-07 ENCOUNTER — Ambulatory Visit (INDEPENDENT_AMBULATORY_CARE_PROVIDER_SITE_OTHER): Payer: Medicare Other | Admitting: Family Medicine

## 2021-02-07 VITALS — BP 118/76 | HR 75 | Temp 97.9°F | Resp 18 | Ht 65.0 in | Wt 167.0 lb

## 2021-02-07 DIAGNOSIS — R252 Cramp and spasm: Secondary | ICD-10-CM | POA: Diagnosis not present

## 2021-02-07 NOTE — Patient Instructions (Signed)
Try to drink 55-60 oz of water daily outside of exercise.  Stretch the affected muscle when the cramping comes.   Give [place] 2-3 business days to get the results of your labs back.   Consider a spoonful of pickle juice nightly.   Let us know if you need anything.

## 2021-02-07 NOTE — Progress Notes (Signed)
Chief Complaint  Patient presents with   Leg Cramping    X1 week, Pt states no pain with walking, Pt states cramping with sitting and laying down.     Subjective: Patient is a 70 y.o. female here for leg cramping.  Off and on cramping over the past week. Happens at night and during the day when she is inactive. She has no issues when walking. She held her Lipitor in the past, unsure if this is related. No inj or change in activity. Drinks around 48 oz water daily.   Past Medical History:  Diagnosis Date   Arthritis    "both knees" (07/02/2015)   Cancer of right breast (HCC)    Claustrophobia    Dysrhythmia    HX OF FAST HEART RATE AND PALPITATIONS - METOPROLOL HAS HELPED   GERD (gastroesophageal reflux disease)    H/O hiatal hernia    H/O iritis    LEFT EYE - STATES HER EYE BECOMES RED AND VERY SENSITIVE TO LIGHT WHEN FLARE UP OF IRITIS   Heart murmur    "benign; 06/2015"   High cholesterol    History of blood transfusion    "when I had my partial hysterectomy"   History of chickenpox 09/26/2014   History of measles    History of MRSA infection    History of shingles    Hypertension    Hypothyroidism    Medicare annual wellness visit, initial 04/07/2015   Multinodular thyroid    PT HAVING TROUBLE SWALLOWING   Neuropathy    FEET   Primary localized osteoarthritis of right knee    Sciatica of right side 04/25/2015   Spinal headache    spinal headache with epidural   Total knee replacement status, right    Type II diabetes mellitus (HCC)    ORAL MEDICATION - NO INSULIN   Wears glasses     Objective: BP 118/76 (BP Location: Left Arm, Patient Position: Sitting, Cuff Size: Normal)   Pulse 75   Temp 97.9 F (36.6 C) (Oral)   Resp 18   Ht 5\' 5"  (1.651 m)   Wt 167 lb (75.8 kg)   SpO2 98%   BMI 27.79 kg/m  General: Awake, appears stated age MSK: No tenderness to palpation over the bony areas or musculature of the lower extremities bilaterally Cardiac: No lower extremity  edema Lungs: No accessory muscle use Psych: Age appropriate judgment and insight, normal affect and mood  Assessment and Plan: Leg cramping - Plan: Comprehensive metabolic panel, Magnesium  She could be dehydrated, will check electrolytes as above.  Consider a spoonful of pickle juice nightly.  Would hold off on any meds at this time. The patient voiced understanding and agreement to the plan.  [place], DO 02/07/21  3:49 PM

## 2021-02-08 LAB — COMPREHENSIVE METABOLIC PANEL
ALT: 17 U/L (ref 0–35)
AST: 16 U/L (ref 0–37)
Albumin: 4.6 g/dL (ref 3.5–5.2)
Alkaline Phosphatase: 54 U/L (ref 39–117)
BUN: 19 mg/dL (ref 6–23)
CO2: 30 mEq/L (ref 19–32)
Calcium: 9.7 mg/dL (ref 8.4–10.5)
Chloride: 101 mEq/L (ref 96–112)
Creatinine, Ser: 0.92 mg/dL (ref 0.40–1.20)
GFR: 62.77 mL/min (ref >60.00)
Glucose, Bld: 73 mg/dL (ref 70–99)
Potassium: 4.9 mEq/L (ref 3.5–5.1)
Sodium: 138 mEq/L (ref 135–145)
Total Bilirubin: 0.4 mg/dL (ref 0.2–1.2)
Total Protein: 6.9 g/dL (ref 6.0–8.3)

## 2021-02-08 LAB — MAGNESIUM: Magnesium: 2.1 mg/dL (ref 1.5–2.5)

## 2021-02-11 ENCOUNTER — Telehealth: Payer: Self-pay | Admitting: Family Medicine

## 2021-02-18 ENCOUNTER — Other Ambulatory Visit: Payer: Self-pay | Admitting: Family Medicine

## 2021-02-18 ENCOUNTER — Other Ambulatory Visit (HOSPITAL_BASED_OUTPATIENT_CLINIC_OR_DEPARTMENT_OTHER): Payer: Self-pay

## 2021-02-18 MED ORDER — LEVOTHYROXINE SODIUM 88 MCG PO TABS
ORAL_TABLET | ORAL | 1 refills | Status: DC
  Filled 2021-02-18: qty 77, 90d supply, fill #0
  Filled 2021-05-22: qty 77, 90d supply, fill #1

## 2021-02-18 MED ORDER — METOPROLOL SUCCINATE ER 25 MG PO TB24
ORAL_TABLET | ORAL | 1 refills | Status: DC
  Filled 2021-02-18: qty 180, 90d supply, fill #0
  Filled 2021-05-22: qty 180, 90d supply, fill #1

## 2021-02-18 MED ORDER — DILTIAZEM HCL ER BEADS 360 MG PO CP24
ORAL_CAPSULE | ORAL | 1 refills | Status: DC
  Filled 2021-02-18: qty 90, 90d supply, fill #0
  Filled 2021-05-22: qty 90, 90d supply, fill #1

## 2021-02-18 MED FILL — Mometasone Furoate Nasal Susp 50 MCG/ACT: NASAL | 90 days supply | Qty: 51 | Fill #2 | Status: AC

## 2021-02-18 MED FILL — Cyclosporine (Ophth) Emulsion 0.05%: OPHTHALMIC | 90 days supply | Qty: 180 | Fill #2 | Status: AC

## 2021-02-19 ENCOUNTER — Other Ambulatory Visit (HOSPITAL_BASED_OUTPATIENT_CLINIC_OR_DEPARTMENT_OTHER): Payer: Self-pay

## 2021-02-20 ENCOUNTER — Other Ambulatory Visit (HOSPITAL_BASED_OUTPATIENT_CLINIC_OR_DEPARTMENT_OTHER): Payer: Self-pay

## 2021-02-20 ENCOUNTER — Telehealth: Payer: Self-pay | Admitting: Gastroenterology

## 2021-02-20 NOTE — Telephone Encounter (Signed)
Patient called stating she was ready to get her esophagus stretched.  Does she need to have an OV first, or can she go ahead and be scheduled for the procedure.  Please advise.

## 2021-02-20 NOTE — Telephone Encounter (Signed)
Victoria Walker can you call her and set her up for an appt with Dr Rush Landmark to discuss?

## 2021-02-20 NOTE — Telephone Encounter (Signed)
error 

## 2021-03-06 NOTE — Telephone Encounter (Signed)
Error

## 2021-03-08 ENCOUNTER — Other Ambulatory Visit: Payer: Self-pay

## 2021-03-08 ENCOUNTER — Encounter: Payer: Self-pay | Admitting: Internal Medicine

## 2021-03-08 ENCOUNTER — Ambulatory Visit (INDEPENDENT_AMBULATORY_CARE_PROVIDER_SITE_OTHER): Payer: Medicare Other | Admitting: Internal Medicine

## 2021-03-08 ENCOUNTER — Other Ambulatory Visit (HOSPITAL_BASED_OUTPATIENT_CLINIC_OR_DEPARTMENT_OTHER): Payer: Self-pay

## 2021-03-08 VITALS — BP 132/80 | HR 75 | Temp 99.0°F | Resp 18 | Ht 65.0 in | Wt 170.0 lb

## 2021-03-08 DIAGNOSIS — J302 Other seasonal allergic rhinitis: Secondary | ICD-10-CM

## 2021-03-08 DIAGNOSIS — J069 Acute upper respiratory infection, unspecified: Secondary | ICD-10-CM | POA: Diagnosis not present

## 2021-03-08 MED ORDER — BENZONATATE 200 MG PO CAPS
200.0000 mg | ORAL_CAPSULE | Freq: Three times a day (TID) | ORAL | 0 refills | Status: DC | PRN
  Filled 2021-03-08: qty 20, 7d supply, fill #0

## 2021-03-08 NOTE — Patient Instructions (Signed)
If cough:  Take Mucinex DM twice a day as needed until better  For nasal congestion:  Use OTC  Flonase or Nasacort (generics are okay and less expensive): 2 nasal sprays on each side of the nose in the morning until you feel better  Use OTC Astepro 2 nasal sprays on each side of the nose twice daily until better   Avoid decongestants such as  Pseudoephedrine or phenylephrine   Ok to take benadryl at night  Call next week if no better

## 2021-03-08 NOTE — Progress Notes (Signed)
Subjective:    Patient ID: Victoria Walker, female    DOB: [birth year], 70 y.o.   MRN: [id number]  DOS:  03/08/2021 Type of visit - description: Acute  Sxs started a week ago: Postnasal dripping causing a tickle in her throat and creating some cough. She is taking antihistaminics and also Nasacort.  Denies fever chills Denies sinus pain but she has some nasal discharge, white in color No itchy eyes or sneezing. Had COVID tests: Negative.  Review of Systems See above   Past Medical History:  Diagnosis Date   Arthritis    "both knees" (07/02/2015)   Cancer of right breast (HCC)    Claustrophobia    Dysrhythmia    HX OF FAST HEART RATE AND PALPITATIONS - METOPROLOL HAS HELPED   GERD (gastroesophageal reflux disease)    H/O hiatal hernia    H/O iritis    LEFT EYE - STATES HER EYE BECOMES RED AND VERY SENSITIVE TO LIGHT WHEN FLARE UP OF IRITIS   Heart murmur    "benign; 06/2015"   High cholesterol    History of blood transfusion    "when I had my partial hysterectomy"   History of chickenpox 09/26/2014   History of measles    History of MRSA infection    History of shingles    Hypertension    Hypothyroidism    Medicare annual wellness visit, initial 04/07/2015   Multinodular thyroid    PT HAVING TROUBLE SWALLOWING   Neuropathy    FEET   Primary localized osteoarthritis of right knee    Sciatica of right side 04/25/2015   Spinal headache    spinal headache with epidural   Total knee replacement status, right    Type II diabetes mellitus ([place])    ORAL MEDICATION - NO INSULIN   Wears glasses     Past Surgical History:  Procedure Laterality Date   ABDOMINAL HYSTERECTOMY  1988   ABDOMINOPLASTY  1986   ANTERIOR AND POSTERIOR REPAIR  05/11/2012   Procedure: ANTERIOR (CYSTOCELE) AND POSTERIOR REPAIR (RECTOCELE);  Surgeon: Reece Packer, MD;  Location: [facility];  Service: Urology;;  with graft   BREAST BIOPSY Right    CARDIAC CATHETERIZATION  1999   COLONOSCOPY      CYSTOSCOPY WITH URETHRAL DILATATION  05/11/2012   Procedure: CYSTOSCOPY WITH URETHRAL DILATATION;  Surgeon: Reece Packer, MD;  Location: [facility];  Service: Urology;;   ESOPHAGOGASTRODUODENOSCOPY     HERNIA REPAIR     MASTECTOMY Right 2007   PLANTAR FASCIA SURGERY Left    THYROIDECTOMY N/A 02/23/2014   Procedure: TOTAL THYROIDECTOMY;  Surgeon: Armandina Gemma, MD;  Location: [facility];  Service: General;  Laterality: N/A;   TOTAL KNEE ARTHROPLASTY Right 07/02/2015   TOTAL KNEE ARTHROPLASTY Right 07/02/2015   Procedure: TOTAL KNEE ARTHROPLASTY;  Surgeon: Elsie Saas, MD;  Location: [facility];  Service: Orthopedics;  Laterality: Right;   TOTAL THYROIDECTOMY  1976   removed three tumor (2 on right; 1 on left)   TUBAL LIGATION     UMBILICAL HERNIA REPAIR  1986   VAGINAL PROLAPSE REPAIR  05/11/2012   Procedure: VAGINAL VAULT SUSPENSION;  Surgeon: Reece Packer, MD;  Location: [facility];  Service: Urology;;   VULVA / PERINEUM BIOPSY     vulvar    Allergies as of 03/08/2021       Reactions   Codeine Anxiety   Jittery    Penicillins Itching   Sulfa Antibiotics Itching   Vicodin [hydrocodone-acetaminophen]  Itching   ITCHING         Medication List        Accurate as of March 08, 2021 11:59 PM. If you have any questions, ask your nurse or doctor.          STOP taking these medications    Fluad Quadrivalent 0.5 ML injection Generic drug: influenza vaccine adjuvanted Stopped by: Kathlene November, MD   fluconazole 150 MG tablet Commonly known as: DIFLUCAN Stopped by: Kathlene November, MD       TAKE these medications    aspirin 81 MG EC tablet Take 81 mg by mouth every other day. Swallow whole.   atorvastatin 40 MG tablet Commonly known as: LIPITOR TAKE 1 TABLET BY MOUTH EVERY OTHER DAY, ALTERNATING WITH 1/2 TABLET   Avapro 75 MG tablet Generic drug: irbesartan TAKE 1 TABLET (75 MG TOTAL) BY MOUTH DAILY.   benzonatate 200 MG capsule Commonly known as: TESSALON Take 1 capsule (200  mg total) by mouth 3 (three) times daily as needed for cough. Started by: Kathlene November, MD   Ciclopirox 1 % shampoo apply to affected area(s) as directed twice a week What changed: Another medication with the same name was removed. Continue taking this medication, and follow the directions you see here. Changed by: Kathlene November, MD   clindamycin 300 MG capsule Commonly known as: CLEOCIN TAKE 2 CAPSULES BY MOUTH 1 HR PRIOR TO DENTAL WORK   diltiazem 360 MG 24 hr capsule Commonly known as: TIAZAC TAKE 1 CAPSULE (360 MG TOTAL) BY MOUTH DAILY.   Estradiol 10 MCG Tabs vaginal tablet INSERT 1 TAB VAGINALLY AT BEDTIME EVERY OTHER DAY   gabapentin 300 MG capsule Commonly known as: NEURONTIN TAKE 1 CAPSULE (300 MG TOTAL) BY MOUTH AT BEDTIME.   glucose blood test strip Commonly known as: ONE TOUCH TEST STRIPS Use as directed twice daily to check blood sugar. DX code E11.9   levocetirizine 5 MG tablet Commonly known as: XYZAL TAKE 1 TABLET (5 MG TOTAL) BY MOUTH EVERY EVENING.   levothyroxine 88 MCG tablet Commonly known as: SYNTHROID TAKE 1 TABLET BY MOUTH ONCE DAILY **SKIP SUNDAY**   Lotemax 0.5 % ophthalmic suspension Generic drug: loteprednol PLACE 1 DROP 4 TIMES A DAY FOR 1 WEEK, THEN 3 TIMES A DAY FOR 1 WEEK, THEN 2 TIMES A DAY FOR 1 WEEK, THEN 1 TIME A DAY FOR 1 WK, THEN STOP IN AFFECTED EYE   Lotemax 0.5 % ophthalmic suspension Generic drug: loteprednol Place one drop into both eyes four times daily with flare up.   metFORMIN 750 MG 24 hr tablet Commonly known as: GLUCOPHAGE-XR TAKE 1 TABLET (750 MG TOTAL) BY MOUTH IN THE MORNING AND AT BEDTIME.   metoprolol succinate 25 MG 24 hr tablet Commonly known as: TOPROL-XL TAKE 1 TABLET BY MOUTH EVERY MORNING AND EVERY NIGHT AT BEDTIME   mometasone 50 MCG/ACT nasal spray Commonly known as: NASONEX USE ONE SPRAY IN EACH NOSTRIL TWICE DAILY IF NEEDED FOR STUFFY NOSE   NexIUM 40 MG capsule Generic drug: esomeprazole TAKE 1 CAPSULE  BY MOUTH ONCE DAILY   OS-CAL 500 + D PO   PROBIOTIC DAILY PO Take 1 capsule by mouth daily.   REFRESH OPTIVE OP Apply to eye.   Restasis 0.05 % ophthalmic emulsion Generic drug: cycloSPORINE PLACE 1 DROP INTO EACH EYE TWO TIMES DAILY   spironolactone 25 MG tablet Commonly known as: ALDACTONE TAKE 1 & 1/2 TABLETS BY MOUTH DAILY   Vitamin D 50 MCG (  2000 UT) Caps Take 1 capsule by mouth every evening.           Objective:   Physical Exam BP 132/80 (BP Location: Left Arm, Patient Position: Sitting, Cuff Size: Small)   Pulse 75   Temp 99 F (37.2 C) (Oral)   Resp 18   Ht 5\' 5"  (1.651 m)   Wt 170 lb (77.1 kg)   SpO2 98%   BMI 28.29 kg/m  General:   Well developed, NAD, BMI noted. HEENT:  Normocephalic . Face symmetric, atraumatic. TMs normal.  Throat symmetric and not red.  Nose slightly congested.  Sinuses minimal TTP bilaterally Lungs:  CTA B Normal respiratory effort, no intercostal retractions, no accessory muscle use. Heart: RRR,  no murmur.  Lower extremities: no pretibial edema bilaterally  Skin: Not pale. Not jaundice Neurologic:  alert & oriented X3.  Speech normal, gait appropriate for age and unassisted Psych--  Cognition and judgment appear intact.  Cooperative with normal attention span and concentration.  Behavior appropriate. No anxious or depressed appearing.      Assessment     70 year old female, PMH includes high cholesterol, hypertension, allergies, diabetes, history of breast cancer, among other problems presents with:  Allergies versus mild URI: Recommend to continue antihistamine needs nasal sprays. Add Astepro, Benadryl at night, [employer] (rx sent). Call if not gradually better. Patient was disappointed about not receiving antibiotics, explained the reason why antibiotics are not needed at this point. See AVS   This visit occurred during the SARS-CoV-2 public health emergency.  Safety protocols were in place, including  screening questions prior to the visit, additional usage of staff PPE, and extensive cleaning of exam room while observing appropriate contact time as indicated for disinfecting solutions.

## 2021-03-11 ENCOUNTER — Other Ambulatory Visit (HOSPITAL_BASED_OUTPATIENT_CLINIC_OR_DEPARTMENT_OTHER): Payer: Self-pay

## 2021-03-11 DIAGNOSIS — Z20822 Contact with and (suspected) exposure to covid-19: Secondary | ICD-10-CM | POA: Diagnosis not present

## 2021-03-11 DIAGNOSIS — U071 COVID-19: Secondary | ICD-10-CM | POA: Diagnosis not present

## 2021-03-11 DIAGNOSIS — R059 Cough, unspecified: Secondary | ICD-10-CM | POA: Diagnosis not present

## 2021-03-11 DIAGNOSIS — R030 Elevated blood-pressure reading, without diagnosis of hypertension: Secondary | ICD-10-CM | POA: Diagnosis not present

## 2021-03-11 MED ORDER — PAXLOVID (300/100) 20 X 150 MG & 10 X 100MG PO TBPK
3.0000 | ORAL_TABLET | Freq: Two times a day (BID) | ORAL | 1 refills | Status: DC
  Filled 2021-03-11: qty 30, 5d supply, fill #0

## 2021-03-12 ENCOUNTER — Other Ambulatory Visit (HOSPITAL_BASED_OUTPATIENT_CLINIC_OR_DEPARTMENT_OTHER): Payer: Self-pay

## 2021-03-12 ENCOUNTER — Telehealth: Payer: Self-pay | Admitting: Family Medicine

## 2021-03-12 NOTE — Telephone Encounter (Signed)
Pt. Said she tested positive on 11/20 for covid. She went to [place] on 11/21 for her and her husband and it was re-confirmed that she has it. They started her on antivirals. She just wanted to inform you. She also wants to make sure its okay to take the antiviral they gave her?  nirmatrelvir & ritonavir (PAXLOVID, 300/100,) 20 x 150 MG & 10 x 100MG  TBPK

## 2021-03-13 ENCOUNTER — Emergency Department (HOSPITAL_BASED_OUTPATIENT_CLINIC_OR_DEPARTMENT_OTHER)
Admission: EM | Admit: 2021-03-13 | Discharge: 2021-03-14 | Disposition: A | Discharge status: Home / Self Care | Payer: Medicare Other | Attending: Emergency Medicine | Admitting: Emergency Medicine

## 2021-03-13 ENCOUNTER — Other Ambulatory Visit: Payer: Self-pay

## 2021-03-13 ENCOUNTER — Encounter (HOSPITAL_BASED_OUTPATIENT_CLINIC_OR_DEPARTMENT_OTHER): Payer: Self-pay | Admitting: *Deleted

## 2021-03-13 DIAGNOSIS — Z7984 Long term (current) use of oral hypoglycemic drugs: Secondary | ICD-10-CM | POA: Insufficient documentation

## 2021-03-13 DIAGNOSIS — T887XXA Unspecified adverse effect of drug or medicament, initial encounter: Secondary | ICD-10-CM

## 2021-03-13 DIAGNOSIS — Z96651 Presence of right artificial knee joint: Secondary | ICD-10-CM | POA: Insufficient documentation

## 2021-03-13 DIAGNOSIS — I10 Essential (primary) hypertension: Secondary | ICD-10-CM | POA: Insufficient documentation

## 2021-03-13 DIAGNOSIS — T375X5A Adverse effect of antiviral drugs, initial encounter: Secondary | ICD-10-CM | POA: Insufficient documentation

## 2021-03-13 DIAGNOSIS — Z7982 Long term (current) use of aspirin: Secondary | ICD-10-CM | POA: Diagnosis not present

## 2021-03-13 DIAGNOSIS — E039 Hypothyroidism, unspecified: Secondary | ICD-10-CM | POA: Diagnosis not present

## 2021-03-13 DIAGNOSIS — Z853 Personal history of malignant neoplasm of breast: Secondary | ICD-10-CM | POA: Insufficient documentation

## 2021-03-13 DIAGNOSIS — Z79899 Other long term (current) drug therapy: Secondary | ICD-10-CM | POA: Diagnosis not present

## 2021-03-13 DIAGNOSIS — U071 COVID-19: Secondary | ICD-10-CM | POA: Insufficient documentation

## 2021-03-13 DIAGNOSIS — R9431 Abnormal electrocardiogram [ECG] [EKG]: Secondary | ICD-10-CM | POA: Diagnosis not present

## 2021-03-13 DIAGNOSIS — R531 Weakness: Secondary | ICD-10-CM | POA: Diagnosis present

## 2021-03-13 DIAGNOSIS — E119 Type 2 diabetes mellitus without complications: Secondary | ICD-10-CM | POA: Insufficient documentation

## 2021-03-13 LAB — CBC WITH DIFFERENTIAL/PLATELET
Abs Immature Granulocytes: 0.03 10*3/uL (ref 0.00–0.07)
Basophils Absolute: 0 10*3/uL (ref 0.0–0.1)
Basophils Relative: 1 %
Eosinophils Absolute: 0 10*3/uL (ref 0.0–0.5)
Eosinophils Relative: 1 %
HCT: 41.1 % (ref 36.0–46.0)
Hemoglobin: 13.8 g/dL (ref 12.0–15.0)
Immature Granulocytes: 1 %
Lymphocytes Relative: 40 %
Lymphs Abs: 2.4 10*3/uL (ref 0.7–4.0)
MCH: 31.5 pg (ref 26.0–34.0)
MCHC: 33.6 g/dL (ref 30.0–36.0)
MCV: 93.8 fL (ref 80.0–100.0)
Monocytes Absolute: 0.4 10*3/uL (ref 0.1–1.0)
Monocytes Relative: 7 %
Neutro Abs: 3.1 10*3/uL (ref 1.7–7.7)
Neutrophils Relative %: 50 %
Platelets: 267 10*3/uL (ref 150–400)
RBC: 4.38 MIL/uL (ref 3.87–5.11)
RDW: 12.3 % (ref 11.5–15.5)
WBC: 5.9 10*3/uL (ref 4.0–10.5)
nRBC: 0 % (ref 0.0–0.2)

## 2021-03-13 LAB — BASIC METABOLIC PANEL
Anion gap: 9 (ref 5–15)
BUN: 15 mg/dL (ref 8–23)
CO2: 25 mmol/L (ref 22–32)
Calcium: 9.3 mg/dL (ref 8.9–10.3)
Chloride: 96 mmol/L — ABNORMAL LOW (ref 98–111)
Creatinine, Ser: 0.88 mg/dL (ref 0.44–1.00)
GFR, Estimated: >60 mL/min (ref >60)
Glucose, Bld: 135 mg/dL — ABNORMAL HIGH (ref 70–99)
Potassium: 4.6 mmol/L (ref 3.5–5.1)
Sodium: 130 mmol/L — ABNORMAL LOW (ref 135–145)

## 2021-03-13 NOTE — ED Provider Notes (Signed)
[facility] EMERGENCY DEPARTMENT Provider Note   CSN: [id number] Arrival date & time: 03/13/21  2230     History Chief Complaint  Patient presents with   Covid Positive    Victoria Walker is a 70 y.o. female.  Patient is a 70 year old female with past medical history of hypertension, hyperlipidemia, diabetes.  Patient presenting today with complaints of weakness, fatigue, indigestion, and feeling jittery.  Patient has been ill with URI symptoms for the past 4 days.  She had a positive COVID test at home and at urgent care.  She was started on Paxlovid yesterday.  The jitteriness and indigestion began shortly after being started on this medication.  She denies to me that she is having any chest pain, difficulty breathing, abdominal pain, or fever.  The history is provided by the patient.      Past Medical History:  Diagnosis Date   Arthritis    "both knees" (07/02/2015)   Cancer of right breast ([place])    Claustrophobia    Dysrhythmia    HX OF FAST HEART RATE AND PALPITATIONS - METOPROLOL HAS HELPED   GERD (gastroesophageal reflux disease)    H/O hiatal hernia    H/O iritis    LEFT EYE - STATES HER EYE BECOMES RED AND VERY SENSITIVE TO LIGHT WHEN FLARE UP OF IRITIS   Heart murmur    "benign; 06/2015"   High cholesterol    History of blood transfusion    "when I had my partial hysterectomy"   History of chickenpox 09/26/2014   History of measles    History of MRSA infection    History of shingles    Hypertension    Hypothyroidism    Medicare annual wellness visit, initial 04/07/2015   Multinodular thyroid    PT HAVING TROUBLE SWALLOWING   Neuropathy    FEET   Primary localized osteoarthritis of right knee    Sciatica of right side 04/25/2015   Spinal headache    spinal headache with epidural   Total knee replacement status, right    Type II diabetes mellitus ([place])    ORAL MEDICATION - NO INSULIN   Wears glasses     Patient Active Problem List    Diagnosis Date Noted   Current use of beta blocker 06/12/2020   Hiatal hernia 04/22/2020   Hx of adenomatous colonic polyps 23/55/7322   History of Helicobacter infection 12/17/2019   Dysphagia 12/17/2019   Gastroesophageal reflux disease 12/17/2019   Foot pain, bilateral 11/18/2019   Breast cancer screening, high risk patient 11/18/2019   Pre-ulcerative corn or callous 03/21/2019   Dysuria 03/21/2019   Myalgia 01/12/2019   Hammertoes of both feet 11/23/2018   Numbness and tingling of foot 11/23/2018   Other allergic rhinitis 06/14/2018   Diabetes mellitus without complication ([place]) 02/54/2706   Hyperkalemia 05/04/2018   Carpal tunnel syndrome on right 05/04/2018   Left knee pain 11/03/2017   Right hip pain 03/17/2017   Urinary frequency 03/10/2017   Primary localized osteoarthritis of right knee    History of MRSA infection    History of measles    Dysrhythmia    Chronic rhinitis 06/06/2015   Shortness of breath 06/06/2015   Vaginitis and vulvovaginitis 04/25/2015   Sciatica of right side 04/25/2015   Preventative health care 04/07/2015   Allergic response 01/01/2015   History of chickenpox 09/26/2014   History of shingles    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 05/23/2014  Deformity of metatarsal bone of left foot 03/03/2014   Vulvar irritation  S/P neg vulvar bx 12/2013 01/23/2014   Multinodular non-toxic goiter 11/07/2013   Right knee DJD 07/12/2013   Neuralgia of left foot 06/24/2013   Tendonitis of ankle, right 04/08/2013   Vaginal dryness, menopausal 04/05/2013   Pronation deformity of ankle, acquired 02/09/2013   Cortical cataract 02/01/2013   H/O bone density study 01/26/2013   Chest pain 01/05/2013   History of iritis 01/04/2013   Dry eye syndrome 10/07/2012   Meibomian gland disease 10/07/2012   Gastroesophageal reflux disease without esophagitis 10/05/2012   Type 2 diabetes mellitus with diabetic neuropathy, without  long-term current use of insulin ([place]) 10/04/2012   Essential hypertension 10/04/2012   Hyperlipidemia, mixed 10/04/2012   S/P right mastectomy 10/04/2012   Diabetic neuropathy ([place]) 10/04/2012   S/P hysterectomy 10/04/2012   Osteopenia 10/04/2012   Hypothyroid 10/04/2012   History of cystocele 10/04/2012   Dyspareunia in female 10/04/2012   Plantar fasciitis 10/04/2012   Nuclear sclerotic cataract 06/17/2012   Iritis 01/06/2012   Peripheral neuropathy 01/06/2012   Disease of thyroid gland 01/06/2012   Breast cancer ([place]) 05/06/2011    Past Surgical History:  Procedure Laterality Date   ABDOMINAL HYSTERECTOMY  1988   ABDOMINOPLASTY  1986   ANTERIOR AND POSTERIOR REPAIR  05/11/2012   Procedure: ANTERIOR (CYSTOCELE) AND POSTERIOR REPAIR (RECTOCELE);  Surgeon: Reece Packer, MD;  Location[facility];  Service: Urology;;  with graft   BREAST BIOPSY Right    CARDIAC CATHETERIZATION  1999   COLONOSCOPY     CYSTOSCOPY WITH URETHRAL DILATATION  05/11/2012   Procedure: CYSTOSCOPY WITH URETHRAL DILATATION;  Surgeon: Reece Packer, MD;  Location[facility];  Service: Urology;;   ESOPHAGOGASTRODUODENOSCOPY     HERNIA REPAIR     MASTECTOMY Right 2007   PLANTAR FASCIA SURGERY Left    THYROIDECTOMY N/A 02/23/2014   Procedure: TOTAL THYROIDECTOMY;  Surgeon: Armandina Gemma, MD;  Location[facility];  Service: General;  Laterality: N/A;   TOTAL KNEE ARTHROPLASTY Right 07/02/2015   TOTAL KNEE ARTHROPLASTY Right 07/02/2015   Procedure: TOTAL KNEE ARTHROPLASTY;  Surgeon: Elsie Saas, MD;  Location: [facility];  Service: Orthopedics;  Laterality: Right;   TOTAL THYROIDECTOMY  1976   removed three tumor (2 on right; 1 on left)   TUBAL LIGATION     UMBILICAL HERNIA REPAIR  1986   VAGINAL PROLAPSE REPAIR  05/11/2012   Procedure: VAGINAL VAULT SUSPENSION;  Surgeon: Reece Packer, MD;  [facility];  Service: Urology;;   VULVA / PERINEUM BIOPSY     vulvar     OB History     Gravida  3   Para   3   Term  3   Preterm      AB      Living  3      SAB      IAB      Ectopic      Multiple      Live Births              Family History  Problem Relation Age of Onset   Hypertension Mother    Heart disease Mother        pacer, CHF   Hyperlipidemia Mother    Kidney disease Mother        1 kidney due to stone   Kidney Stones Mother    Hypertension Father    Diabetes Father  Alcohol abuse Father    Lung cancer Father        lung, psa   Diabetes Sister    Hypertension Sister    Breast cancer Sister    Diabetes Brother    Hypertension Brother    Hyperlipidemia Brother    Kidney disease Brother        dialysis   Stroke Brother    Hypertension Maternal Grandmother    Heart disease Maternal Grandmother    Stroke Maternal Grandmother    Hypertension Maternal Grandfather    Hypertension Daughter    Arthritis Son    Hyperlipidemia Sister    Hypertension Sister    Diabetes Sister    Thyroid disease Sister    Kidney disease Sister    Endometriosis Sister    Breast cancer Sister        thyroid and breast   Thyroid cancer Sister    Arthritis Sister        back pain   Hypertension Sister    Thyroid disease Sister    Hypertension Brother    Hyperlipidemia Brother    Hypertension Brother    Hyperlipidemia Brother    Benign prostatic hyperplasia Brother    Hypertension Daughter    Hyperlipidemia Daughter    Diabetes Daughter    Hypertension Maternal Aunt    Hypertension Maternal Uncle    Hypertension Paternal Aunt    Cancer Paternal Aunt    Hypertension Paternal Uncle    Colon cancer Neg Hx    Stomach cancer Neg Hx    Esophageal cancer Neg Hx    Pancreatic cancer Neg Hx    Liver disease Neg Hx    Rectal cancer Neg Hx    Inflammatory bowel disease Neg Hx     Social History   Tobacco Use   Smoking status: Never   Smokeless tobacco: Never  Vaping Use   Vaping Use: Never used  Substance Use Topics   Alcohol use: No    Alcohol/week: 0.0  standard drinks   Drug use: No    Home Medications Prior to Admission medications   Medication Sig Start Date End Date Taking? Authorizing Provider  aspirin 81 MG EC tablet Take 81 mg by mouth every other day. Swallow whole.    [provider]  atorvastatin (LIPITOR) 40 MG tablet TAKE 1 TABLET BY MOUTH EVERY OTHER DAY, ALTERNATING WITH 1/2 TABLET 03/20/20 03/20/21  Mosie Lukes, MD  AVAPRO 75 MG tablet TAKE 1 TABLET (75 MG TOTAL) BY MOUTH DAILY. 11/20/20 11/20/21  Mosie Lukes, MD  benzonatate (TESSALON) 200 MG capsule Take 1 capsule (200 mg total) by mouth 3 (three) times daily as needed for cough. 03/08/21   Colon Branch, MD  Calcium Carbonate-Vitamin D (OS-CAL 500 + D PO)     [provider]  Carboxymethylcellul-Glycerin (REFRESH OPTIVE OP) Apply to eye.    [provider]  Cholecalciferol (VITAMIN D) 2000 UNITS CAPS Take 1 capsule by mouth every evening.     [provider]  Ciclopirox 1 % shampoo apply to affected area(s) as directed twice a week 10/24/20     clindamycin (CLEOCIN) 300 MG capsule TAKE 2 CAPSULES BY MOUTH 1 HR PRIOR TO DENTAL WORK Patient not taking: Reported on 11/30/2020 05/15/20   [provider]  cycloSPORINE (RESTASIS) 0.05 % ophthalmic emulsion PLACE 1 DROP INTO EACH EYE TWO TIMES DAILY 05/14/20 05/22/21  Moya, Carlisle Beers, MD  diltiazem (TIAZAC) 360 MG 24 hr capsule TAKE 1 CAPSULE (  360 MG TOTAL) BY MOUTH DAILY. 02/18/21 02/18/22  Mosie Lukes, MD  Estradiol 10 MCG TABS vaginal tablet INSERT 1 TAB VAGINALLY AT BEDTIME EVERY OTHER DAY 07/02/20 07/02/21  Lavonia Drafts, MD  gabapentin (NEURONTIN) 300 MG capsule TAKE 1 CAPSULE (300 MG TOTAL) BY MOUTH AT BEDTIME. 09/21/20 09/21/21  Mosie Lukes, MD  glucose blood (ONE TOUCH TEST STRIPS) test strip Use as directed twice daily to check blood sugar. DX code E11.9 11/30/17   Mosie Lukes, MD  levocetirizine (XYZAL) 5 MG tablet TAKE 1 TABLET (5 MG TOTAL) BY MOUTH EVERY EVENING.  04/02/20 04/02/21  Mosie Lukes, MD  levothyroxine (SYNTHROID) 88 MCG tablet TAKE 1 TABLET BY MOUTH ONCE DAILY **SKIP SUNDAY** 02/18/21 02/18/22  Mosie Lukes, MD  LOTEMAX 0.5 % ophthalmic suspension PLACE 1 DROP 4 TIMES A DAY FOR 1 WEEK, THEN 3 TIMES A DAY FOR 1 WEEK, THEN 2 TIMES A DAY FOR 1 WEEK, THEN 1 TIME A DAY FOR 1 WK, THEN STOP IN AFFECTED EYE 05/15/20 05/15/21  Moya, Carlisle Beers, MD  loteprednol (LOTEMAX) 0.5 % ophthalmic suspension Place one drop into both eyes four times daily with flare up. 08/23/20     metFORMIN (GLUCOPHAGE-XR) 750 MG 24 hr tablet TAKE 1 TABLET (750 MG TOTAL) BY MOUTH IN THE MORNING AND AT BEDTIME. 12/12/20 12/12/21  Mosie Lukes, MD  metoprolol succinate (TOPROL-XL) 25 MG 24 hr tablet TAKE 1 TABLET BY MOUTH EVERY MORNING AND EVERY NIGHT AT BEDTIME 02/18/21 02/18/22  Mosie Lukes, MD  mometasone (NASONEX) 50 MCG/ACT nasal spray USE ONE SPRAY IN EACH NOSTRIL TWICE DAILY IF NEEDED FOR STUFFY NOSE 06/12/20 06/12/21  Dara Hoyer, FNP  NEXIUM 40 MG capsule TAKE 1 CAPSULE BY MOUTH ONCE DAILY 09/21/20 09/21/21  Mosie Lukes, MD  nirmatrelvir & ritonavir (PAXLOVID, 300/100,) 20 x 150 MG & 10 x 100MG  TBPK Take 3 tablets by mouth 2 (two) times daily for 5 days. 03/11/21     Probiotic Product (PROBIOTIC DAILY PO) Take 1 capsule by mouth daily.    [provider]  spironolactone (ALDACTONE) 25 MG tablet TAKE 1 & 1/2 TABLETS BY MOUTH DAILY 09/21/20 09/21/21  Mosie Lukes, MD    Allergies    Codeine, Penicillins, Sulfa antibiotics, and Vicodin [hydrocodone-acetaminophen]  Review of Systems   Review of Systems  All other systems reviewed and are negative.  Physical Exam Updated Vital Signs BP (!) 163/89 (BP Location: Left Arm)   Pulse 63   Temp 98 F (36.7 C) (Oral)   Resp 20   Ht 5\' 5"  (1.651 m)   Wt 77.1 kg   SpO2 100%   BMI 28.29 kg/m   Physical Exam Vitals and nursing note reviewed.  Constitutional:      General: She is not in acute distress.     Appearance: She is well-developed. She is not diaphoretic.  HENT:     Head: Normocephalic and atraumatic.  Cardiovascular:     Rate and Rhythm: Normal rate and regular rhythm.     Heart sounds: No murmur heard.   No friction rub. No gallop.  Pulmonary:     Effort: Pulmonary effort is normal. No respiratory distress.     Breath sounds: Normal breath sounds. No wheezing.  Abdominal:     General: Bowel sounds are normal. There is no distension.     Palpations: Abdomen is soft.     Tenderness: There is no abdominal tenderness.  Musculoskeletal:  General: Normal range of motion.     Cervical back: Normal range of motion and neck supple.  Skin:    General: Skin is warm and dry.  Neurological:     General: No focal deficit present.     Mental Status: She is alert and oriented to person, place, and time.    ED Results / Procedures / Treatments   Labs (all labs ordered are listed, but only abnormal results are displayed) Labs Reviewed  BASIC METABOLIC PANEL    EKG EKG Interpretation  Date/Time:  Wednesday March 13 2021 23:24:05 EST Ventricular Rate:  65 PR Interval:  216 QRS Duration: 92 QT Interval:  423 QTC Calculation: 440 R Axis:   -7 Text Interpretation: Sinus or ectopic atrial rhythm Borderline prolonged PR interval Confirmed by Veryl Speak [phone]) on 03/13/2021 11:37:28 PM  Radiology No results found.  Procedures Procedures   Medications Ordered in ED Medications - No data to display  ED Course  I have reviewed the triage vital signs and the nursing notes.  Pertinent labs & imaging results that were available during my care of the patient were reviewed by me and considered in my medical decision making (see chart for details).    MDM Rules/Calculators/A&P  Patient with recently diagnosed COVID-19 presenting with complaints of jitteriness and heartburn that began yesterday after starting Paxlovid.  She denies any chest pain or difficulty breathing  and patient's cardiac work-up is unremarkable.  EKG is unchanged and troponin is negative.  Remainder of laboratory studies are unremarkable and vital signs have been stable.  I suspect her symptoms are likely side effects of the Paxlovid.  I feel as though she can safely stop this medication and see if symptoms resolved.  Patient appropriate for discharge with outpatient follow-up/as needed return.  Final Clinical Impression(s) / ED Diagnoses Final diagnoses:  None    Rx / DC Orders ED Discharge Orders     None        Veryl Speak, MD 03/14/21 [phone]

## 2021-03-13 NOTE — ED Triage Notes (Signed)
COVID + DX x 3 days  ago , Pt reports feeling jittery today and having " indigestion"

## 2021-03-14 LAB — TROPONIN I (HIGH SENSITIVITY): Troponin I (High Sensitivity): 5 ng/L (ref <18)

## 2021-03-14 NOTE — Discharge Instructions (Signed)
Stop taking Paxlovid.  Continue other medications as previously prescribed.  Return to the emergency department if you develop severe chest pain, high fevers, difficulty breathing, or other new and concerning symptoms.

## 2021-03-15 DIAGNOSIS — R030 Elevated blood-pressure reading, without diagnosis of hypertension: Secondary | ICD-10-CM | POA: Diagnosis not present

## 2021-03-15 DIAGNOSIS — J019 Acute sinusitis, unspecified: Secondary | ICD-10-CM | POA: Diagnosis not present

## 2021-03-15 DIAGNOSIS — R059 Cough, unspecified: Secondary | ICD-10-CM | POA: Diagnosis not present

## 2021-03-15 DIAGNOSIS — Z20822 Contact with and (suspected) exposure to covid-19: Secondary | ICD-10-CM | POA: Diagnosis not present

## 2021-03-18 ENCOUNTER — Telehealth: Payer: Self-pay

## 2021-03-18 NOTE — Telephone Encounter (Signed)
Patient called twice, was triaged. Patient evaluated/treated in ER.   [facility] - ClientTELEPHONE ADVICE RECORDAccessNurse [place] Initial Comment [name] Location confirmed. caller has covid on Monday. caller is not having any symptoms. Translation No Nurse Assessment Nurse: Brigitte Pulse, RN, Megan Date/Time (Eastern Time): 03/13/2021 9:30:55 PM Confirm and document reason for call. If symptomatic, describe symptoms. ---Caller states she called about an hour ago and was told to call ems, she did and her HR was low but when EMS arrived it had went up current HR is 53 BP 113/75 Does the patient have any new or worsening symptoms? ---Yes Will a triage be completed? ---Yes Related visit to physician within the last 2 weeks? ---No Does the PT have any chronic conditions? (i.e. diabetes, asthma, this includes High risk factors for pregnancy, etc.) ---Yes List chronic conditions. ---Hypertension, DM, high cholesterol. Is this a behavioral health or substance abuse call? ---No Guidelines Guideline Title Affirmed Question Affirmed Notes Nurse Date/Time (Eastern Time) Heart Rate and Heartbeat Questions Age > 60 years (Exception: brief heartbeat symptoms that went away and now feels well) Brigitte Pulse, RN, [facility] 03/13/2021 9:33:17 PM Disp. Time Eilene Ghazi Time) Disposition Final User PLEASE NOTE: All timestamps contained within this report are represented as [place] Standard Time. CONFIDENTIALTY NOTICE: This fax transmission is intended only for the addressee. It contains information that is legally privileged, confidential or otherwise protected from use or disclosure. If you are not the intended recipient, you are strictly prohibited from reviewing, disclosing, copying using or disseminating any of this information or taking any action in reliance on or regarding  this information. If you have received this fax in error, please notify us immediately by telephone so that we can arrange for its return to [place]. Phone: [phone], Toll-Free: [phone], Fax: [phone] Page: 2 of 2 Call Id: [id number] 03/13/2021 9:38:27 PM See HCP within 4 Hours (or PCP triage) Devonne Doughty, RN, Jinny Blossom Caller Disagree/Comply Comply Caller Understands Yes PreDisposition Did not know what to do Care Advice Given Per Guideline SEE HCP (OR PCP TRIAGE) WITHIN 4 HOURS: * IF OFFICE WILL BE OPEN: You need to be seen within the next 3 or 4 hours. Call your doctor (or NP/PA) now or as soon as the office opens. * IF OFFICE WILL BE CLOSED AND NO PCP (PRIMARY CARE PROVIDER) SECOND-LEVEL TRIAGE: You need to be seen within the next 3 or 4 hours. A nearby Urgent Care Center [facility]) is often a good source of care. Another choice is to go to the ED. Go sooner if you become worse. * IF OFFICE WILL BE CLOSED AND PCP SECOND-LEVEL TRIAGE REQUIRED: You may need to be seen. Your doctor (or NP/PA) will want to talk with you to decide what's best. I'll page the on-call provider now. If you haven't heard from the provider (or me) within 30 minutes, call again. NOTE: If on-call provider can't be reached, send to [facility] or ED. BRING MEDICINES: * Please bring a list of your current medicines when you go to see the doctor. * It is also a good idea to bring the pill bottles too. This will help the doctor to make certain you are taking the right medicines and the right dose. CALL BACK IF: * You become worse CARE ADVICE given per Heart Rate and Heartbeat Questions (Adult) guideline. Comments User: Gwinda Maine, RN Date/Time Eilene Ghazi Time): 03/13/2021 9:34:43 PM caller states she  feels a little indigestion every now and then. Referrals [facility] - [place] RECORDAccessNurse Patient Name:Victoria Walker Initial Comment Caller was DX with covid  Monday. She is taking the paxlovid and it is effecting her heart rate. Her BP 100/62 and heart rate 49. She took 1 metroplol. ---Caller was DX with covid Monday. She is taking the paxlovid and it is effecting her heart rate. Her BP 100/62 and heart rate 49. caller states she is having indigestion and feels like something is wrong. She took 1 metroplol. Does the patient have any new or worsening symptoms? ---Yes Will a triage be completed? ---Yes Related visit to physician within the last 2 weeks? ---Yes Does the PT have any chronic conditions? (i.e. diabetes, asthma, this includes High risk factors for pregnancy, etc.) ---Yes List chronic conditions. ---hypertension, diabetic, high cholesterol Is this a behavioral health or substance abuse call? ---No Guidelines Guideline Title Affirmed Question Affirmed Notes Nurse Date/Time (Eastern Time) Heart Rate and Heartbeat Questions [1] Dizziness, lightheadedness, or weakness AND [2] heart beating very slowly (e.g., < 50 / minute) Ooton, RN, Judson Roch 03/13/2021 6:57:41 PM PLEASE NOTE: All timestamps contained within this report are represented as [place] Standard Time. CONFIDENTIALTY NOTICE: This fax transmission is intended only for the addressee. It contains information that is legally privileged, confidential or otherwise protected from use or disclosure. If you are not the intended recipient, you are strictly prohibited from reviewing, disclosing, copying using or disseminating any of this information or taking any action in reliance on or regarding this information. If you have received this fax in error, please notify us immediately by telephone so that we can arrange for its return to [place]. Phone: [phone], Toll-Free: [phone], Fax: [phone] Page: 2 of 2 Call Id: [id number] [place]. Time Eilene Ghazi Time) Disposition Final User 03/13/2021 7:00:23 PM Call EMS 911 Now Yes Ooton, RN, [place] Disagree/Comply Comply Caller  Understands Yes PreDisposition InappropriateToAsk Care Advice Given Per Guideline CALL EMS 911 NOW: * Immediate medical attention is needed. You need to hang up and call 911 (or an ambulance). CARE ADVICE given per Heart Rate and Heartbeat Questions (Adult) guideline

## 2021-03-22 ENCOUNTER — Other Ambulatory Visit (HOSPITAL_BASED_OUTPATIENT_CLINIC_OR_DEPARTMENT_OTHER): Payer: Self-pay

## 2021-03-22 ENCOUNTER — Other Ambulatory Visit: Payer: Self-pay | Admitting: Family Medicine

## 2021-03-22 DIAGNOSIS — Z780 Asymptomatic menopausal state: Secondary | ICD-10-CM

## 2021-03-22 DIAGNOSIS — R109 Unspecified abdominal pain: Secondary | ICD-10-CM

## 2021-03-22 DIAGNOSIS — I10 Essential (primary) hypertension: Secondary | ICD-10-CM

## 2021-03-22 DIAGNOSIS — E785 Hyperlipidemia, unspecified: Secondary | ICD-10-CM

## 2021-03-22 DIAGNOSIS — K219 Gastro-esophageal reflux disease without esophagitis: Secondary | ICD-10-CM

## 2021-03-22 MED ORDER — SPIRONOLACTONE 25 MG PO TABS
ORAL_TABLET | Freq: Every day | ORAL | 1 refills | Status: DC
  Filled 2021-03-22: qty 135, 90d supply, fill #0
  Filled 2021-06-20: qty 135, 90d supply, fill #1

## 2021-03-22 MED ORDER — NEXIUM 40 MG PO CPDR
40.0000 mg | DELAYED_RELEASE_CAPSULE | Freq: Every day | ORAL | 1 refills | Status: DC
  Filled 2021-03-22: qty 90, 90d supply, fill #0
  Filled 2021-06-20: qty 90, 90d supply, fill #1

## 2021-03-22 MED ORDER — ATORVASTATIN CALCIUM 40 MG PO TABS
ORAL_TABLET | ORAL | 1 refills | Status: DC
  Filled 2021-03-22: qty 66, 84d supply, fill #0
  Filled 2021-06-20: qty 66, 84d supply, fill #1
  Filled 2021-09-20: qty 66, 84d supply, fill #2
  Filled 2021-12-13: qty 66, 84d supply, fill #3
  Filled 2022-03-19: qty 66, 84d supply, fill #4

## 2021-03-22 MED ORDER — LEVOCETIRIZINE DIHYDROCHLORIDE 5 MG PO TABS
ORAL_TABLET | Freq: Every evening | ORAL | 3 refills | Status: DC
  Filled 2021-03-22: qty 90, 90d supply, fill #0
  Filled 2021-06-20: qty 90, 90d supply, fill #1

## 2021-03-22 MED ORDER — GABAPENTIN 300 MG PO CAPS
ORAL_CAPSULE | Freq: Every day | ORAL | 1 refills | Status: DC
  Filled 2021-03-22: qty 90, 90d supply, fill #0
  Filled 2021-06-20: qty 90, 90d supply, fill #1

## 2021-03-26 ENCOUNTER — Telehealth: Payer: Self-pay | Admitting: Gastroenterology

## 2021-03-26 ENCOUNTER — Other Ambulatory Visit (HOSPITAL_BASED_OUTPATIENT_CLINIC_OR_DEPARTMENT_OTHER): Payer: Self-pay

## 2021-03-26 ENCOUNTER — Ambulatory Visit: Payer: Medicare Other | Admitting: Gastroenterology

## 2021-03-26 NOTE — Telephone Encounter (Signed)
Good Morning Dr. Rush Landmark,   Patient called to reschedule her appointment for today at 1:50 due to testing positive for Covid yesterday.   She is rescheduled for 1/18 at 11:30.

## 2021-03-26 NOTE — Telephone Encounter (Signed)
Sorry to hear about this. I hope she gets well soon. Thank you for update and timing of rescheduled appointment. GM

## 2021-03-28 DIAGNOSIS — E1140 Type 2 diabetes mellitus with diabetic neuropathy, unspecified: Secondary | ICD-10-CM | POA: Diagnosis not present

## 2021-04-23 ENCOUNTER — Other Ambulatory Visit (HOSPITAL_BASED_OUTPATIENT_CLINIC_OR_DEPARTMENT_OTHER): Payer: Self-pay

## 2021-04-26 ENCOUNTER — Ambulatory Visit: Payer: Medicare Other

## 2021-04-29 ENCOUNTER — Other Ambulatory Visit: Payer: Self-pay

## 2021-04-29 ENCOUNTER — Ambulatory Visit: Payer: Medicare Other

## 2021-04-29 DIAGNOSIS — M2142 Flat foot [pes planus] (acquired), left foot: Secondary | ICD-10-CM

## 2021-04-29 DIAGNOSIS — M2041 Other hammer toe(s) (acquired), right foot: Secondary | ICD-10-CM

## 2021-04-29 DIAGNOSIS — M2042 Other hammer toe(s) (acquired), left foot: Secondary | ICD-10-CM

## 2021-04-29 DIAGNOSIS — E1140 Type 2 diabetes mellitus with diabetic neuropathy, unspecified: Secondary | ICD-10-CM

## 2021-04-29 DIAGNOSIS — M2141 Flat foot [pes planus] (acquired), right foot: Secondary | ICD-10-CM

## 2021-04-29 NOTE — Progress Notes (Signed)
SITUATION Reason for Consult: Evaluation for Prefabricated Diabetic Shoes and Bilateral Custom Diabetic Inserts. Patient / Caregiver Report: Patient is ready for diabetic shoes  OBJECTIVE DATA: Patient History / Diagnosis:    ICD-10-CM   1. Type 2 diabetes mellitus with diabetic neuropathy, without long-term current use of insulin (HCC)  E11.40     2. Hammertoe of left foot  M20.42     3. Pes planus of both feet  M21.41    M21.42     4. Hammertoe of right foot  M20.41       Presence of Diabetic Complications: - Peripheral Neuropathy  Current or Previous Devices: New balance and functional foot orthotics  In-Person Foot Examination:  Skin presentation:   Thin, Shiny, Hairless Nail presentation:   Thick, Ingrown, With Fungus Ulcers & Callousing:   None and no history  Toe / Foot Deformities:   - Pes Planus  - Hindfoot Valgus - Forefoot ABduction - Hammertoes   Sensation:    Intact  Shoe Size: 71M  ORTHOTIC RECOMMENDATION Recommended Devices: - 1x pair prefabricated PDAC approved diabetic shoes: [id number] 71M - 3x pair custom-to-patient direct vacuum formed diabetic insoles.   GOALS OF SHOES AND INSOLES - Reduce shear and pressure - Reduce / Prevent callus formation - Reduce / Prevent ulceration - Protect the fragile healing compromised diabetic foot.  Patient would benefit from diabetic shoes and inserts as patient has diabetes mellitus and the patient has one or more of the following conditions: - History of partial or complete amputation of the foot - History of previous foot ulceration. - History of pre-ulcerative callus - Peripheral neuropathy with evidence of callus formation - Foot deformity - Poor circulation  ACTIONS PERFORMED Patient was casted for insoles via crush box and measured for shoes via brannock device. Procedure was explained and patient tolerated procedure well. All questions were answered and concerns addressed.  Out of pocket cost  communicated to patient. Once cost verified and agreed upon and diabetic certification received, casts are to be sent to [facility] for fabrication. Patient is to be called for fitting when devices are ready.

## 2021-05-01 ENCOUNTER — Telehealth: Payer: Self-pay | Admitting: Family Medicine

## 2021-05-01 NOTE — Telephone Encounter (Signed)
Patient came and dropped off form to be filled out by Victoria Walker Patient would like to be called when its ready for pick up

## 2021-05-01 NOTE — Telephone Encounter (Signed)
Form placed in yellow bin

## 2021-05-06 ENCOUNTER — Telehealth: Payer: Self-pay | Admitting: Podiatry

## 2021-05-06 NOTE — Telephone Encounter (Signed)
Pt left message stating her pcp called and told her they were faxing the documents for her diabetic shoes last week and she wanted to make sure we got them..  I returned call and left message for pt that we did get the documents and everything has been ordered and when we get the shoes and inserts in I will call to schedule an appt to pick them up.

## 2021-05-07 ENCOUNTER — Encounter: Payer: Self-pay | Admitting: Family Medicine

## 2021-05-07 ENCOUNTER — Ambulatory Visit (INDEPENDENT_AMBULATORY_CARE_PROVIDER_SITE_OTHER): Payer: Medicare Other | Admitting: Family Medicine

## 2021-05-07 VITALS — BP 105/53 | HR 73 | Temp 98.1°F | Resp 12 | Ht 65.0 in | Wt 169.0 lb

## 2021-05-07 DIAGNOSIS — M62838 Other muscle spasm: Secondary | ICD-10-CM

## 2021-05-07 NOTE — Patient Instructions (Addendum)
Glad to hear we are making some gradual improvement with your neck pain. Let's try a few weeks of these ideas and let us know if not improving: Home stretches - see handout Heating pad or moist heat a few times per day Massage Occasional Ibuprofen or Aleve if your stomach tolerates it - stop if any signs of stomach bleeding

## 2021-05-07 NOTE — Progress Notes (Signed)
Acute Office Visit  Subjective:    Patient ID: Victoria Walker, female    DOB: 1951-01-22, 71 y.o.   MRN: [id number]  Chief Complaint  Patient presents with   crook in left side of neck    History of degenerative disc in neck     HPI Patient is in today for left sided neck stiffness.   Patient reports that about 2 weeks ago she woke up with a "crook in the neck." States it was so bad initially that she couldn't drive because she was unable to look over her right shoulder. States it has improved significantly since then. No pain if looking straight ahead, but 3/10 discomfort looking over right shoulder. Pain is left upper neck and sometimes radiates to occipital insertion of trapezius. Reports that many years ago she had similar pain, but with radicular symptoms and was told she had DDD. Denies any radicular symptoms today, no shoulder pain, radiation of pain into arm, no numbness/tingling, loss of range of motion, asymmetrical weakness.     Past Medical History:  Diagnosis Date   Arthritis    "both knees" (07/02/2015)   Cancer of right breast ([place])    Claustrophobia    Dysrhythmia    HX OF FAST HEART RATE AND PALPITATIONS - METOPROLOL HAS HELPED   GERD (gastroesophageal reflux disease)    H/O hiatal hernia    H/O iritis    LEFT EYE - STATES HER EYE BECOMES RED AND VERY SENSITIVE TO LIGHT WHEN FLARE UP OF IRITIS   Heart murmur    "benign; 06/2015"   High cholesterol    History of blood transfusion    "when I had my partial hysterectomy"   History of chickenpox 09/26/2014   History of measles    History of MRSA infection    History of shingles    Hypertension    Hypothyroidism    Medicare annual wellness visit, initial 04/07/2015   Multinodular thyroid    PT HAVING TROUBLE SWALLOWING   Neuropathy    FEET   Primary localized osteoarthritis of right knee    Sciatica of right side 04/25/2015   Spinal headache    spinal headache with epidural   Total knee replacement  status, right    Type II diabetes mellitus ([place])    ORAL MEDICATION - NO INSULIN   Wears glasses     Past Surgical History:  Procedure Laterality Date   ABDOMINAL HYSTERECTOMY  1988   ABDOMINOPLASTY  1986   ANTERIOR AND POSTERIOR REPAIR  05/11/2012   Procedure: ANTERIOR (CYSTOCELE) AND POSTERIOR REPAIR (RECTOCELE);  Surgeon: Reece Packer, MD;  Location: [facility];  Service: Urology;;  with graft   BREAST BIOPSY Right    CARDIAC CATHETERIZATION  1999   COLONOSCOPY     CYSTOSCOPY WITH URETHRAL DILATATION  05/11/2012   Procedure: CYSTOSCOPY WITH URETHRAL DILATATION;  Surgeon: Reece Packer, MD;  Location: [facility];  Service: Urology;;   ESOPHAGOGASTRODUODENOSCOPY     HERNIA REPAIR     MASTECTOMY Right 2007   PLANTAR FASCIA SURGERY Left    THYROIDECTOMY N/A 02/23/2014   Procedure: TOTAL THYROIDECTOMY;  Surgeon: Armandina Gemma, MD;  Location: [facility];  Service: General;  Laterality: N/A;   TOTAL KNEE ARTHROPLASTY Right 07/02/2015   TOTAL KNEE ARTHROPLASTY Right 07/02/2015   Procedure: TOTAL KNEE ARTHROPLASTY;  Surgeon: Elsie Saas, MD;  Location: [place];  Service: Orthopedics;  Laterality: Right;   TOTAL THYROIDECTOMY  1976   removed three tumor (2  on right; 1 on left)   TUBAL LIGATION     UMBILICAL HERNIA REPAIR  1986   VAGINAL PROLAPSE REPAIR  05/11/2012   Procedure: VAGINAL VAULT SUSPENSION;  Surgeon: Reece Packer, MD;  Location: [facility];  Service: Urology;;   VULVA / PERINEUM BIOPSY     vulvar    Family History  Problem Relation Age of Onset   Hypertension Mother    Heart disease Mother        pacer, CHF   Hyperlipidemia Mother    Kidney disease Mother        1 kidney due to stone   Kidney Stones Mother    Hypertension Father    Diabetes Father    Alcohol abuse Father    Lung cancer Father        lung, psa   Diabetes Sister    Hypertension Sister    Breast cancer Sister    Diabetes Brother    Hypertension Brother    Hyperlipidemia Brother    Kidney disease  Brother        dialysis   Stroke Brother    Hypertension Maternal Grandmother    Heart disease Maternal Grandmother    Stroke Maternal Grandmother    Hypertension Maternal Grandfather    Hypertension Daughter    Arthritis Son    Hyperlipidemia Sister    Hypertension Sister    Diabetes Sister    Thyroid disease Sister    Kidney disease Sister    Endometriosis Sister    Breast cancer Sister        thyroid and breast   Thyroid cancer Sister    Arthritis Sister        back pain   Hypertension Sister    Thyroid disease Sister    Hypertension Brother    Hyperlipidemia Brother    Hypertension Brother    Hyperlipidemia Brother    Benign prostatic hyperplasia Brother    Hypertension Daughter    Hyperlipidemia Daughter    Diabetes Daughter    Hypertension Maternal Aunt    Hypertension Maternal Uncle    Hypertension Paternal Aunt    Cancer Paternal Aunt    Hypertension Paternal Uncle    Colon cancer Neg Hx    Stomach cancer Neg Hx    Esophageal cancer Neg Hx    Pancreatic cancer Neg Hx    Liver disease Neg Hx    Rectal cancer Neg Hx    Inflammatory bowel disease Neg Hx     Social History   Socioeconomic History   Marital status: Married    Spouse name: Not on file   Number of children: 3   Years of education: Not on file   Highest education level: Not on file  Occupational History    Comment: Retired  Tobacco Use   Smoking status: Never   Smokeless tobacco: Never  Vaping Use   Vaping Use: Never used  Substance and Sexual Activity   Alcohol use: No    Alcohol/week: 0.0 standard drinks   Drug use: No   Sexual activity: Not Currently    Birth control/protection: Surgical    Comment: lives with husband, retired from various jobs, diabetic diet  Other Topics Concern   Not on file  Social History Narrative   Not on file   Social Determinants of Health   Financial Resource Strain: Low Risk    Difficulty of Paying Living Expenses: Not hard at all  Food  Insecurity: No Food Insecurity  Worried About Charity fundraiser in the Last Year: Never true   [place] in the Last Year: Never true  Transportation Needs: No Transportation Needs   Lack of Transportation (Medical): No   Lack of Transportation (Non-Medical): No  Physical Activity: Insufficiently Active   Days of Exercise per Week: 2 days   Minutes of Exercise per Session: 20 min  Stress: No Stress Concern Present   Feeling of Stress : Not at all  Social Connections: Moderately Integrated   Frequency of Communication with Friends and Family: More than three times a week   Frequency of Social Gatherings with Friends and Family: More than three times a week   Attends Religious Services: More than 4 times per year   Active Member of [employer] or Organizations: No   Attends Archivist Meetings: Never   Marital Status: Married  Human resources officer Violence: Not At Risk   Fear of Current or Ex-Partner: No   Emotionally Abused: No   Physically Abused: No   Sexually Abused: No    Outpatient Medications Prior to Visit  Medication Sig Dispense Refill   aspirin 81 MG EC tablet Take 81 mg by mouth every other day. Swallow whole.     atorvastatin (LIPITOR) 40 MG tablet TAKE 1 TABLET BY MOUTH EVERY OTHER DAY, ALTERNATING WITH 1/2 TABLET 180 tablet 1   AVAPRO 75 MG tablet TAKE 1 TABLET (75 MG TOTAL) BY MOUTH DAILY. 90 tablet 1   Calcium Carbonate-Vitamin D (OS-CAL 500 + D PO)      Carboxymethylcellul-Glycerin (REFRESH OPTIVE OP) Apply to eye.     Cholecalciferol (VITAMIN D) 2000 UNITS CAPS Take 1 capsule by mouth every evening.      Ciclopirox 1 % shampoo apply to affected area(s) as directed twice a week 120 mL 11   clindamycin (CLEOCIN) 300 MG capsule      cycloSPORINE (RESTASIS) 0.05 % ophthalmic emulsion PLACE 1 DROP INTO EACH EYE TWO TIMES DAILY 180 mL 3   diltiazem (TIAZAC) 360 MG 24 hr capsule TAKE 1 CAPSULE (360 MG TOTAL) BY MOUTH DAILY. 90 capsule 1   Estradiol 10 MCG TABS  vaginal tablet INSERT 1 TAB VAGINALLY AT BEDTIME EVERY OTHER DAY 48 tablet 4   gabapentin (NEURONTIN) 300 MG capsule TAKE 1 CAPSULE (300 MG TOTAL) BY MOUTH AT BEDTIME. 90 capsule 1   glucose blood (ONE TOUCH TEST STRIPS) test strip Use as directed twice daily to check blood sugar. DX code E11.9 300 each 1   levocetirizine (XYZAL) 5 MG tablet TAKE 1 TABLET (5 MG TOTAL) BY MOUTH EVERY EVENING. 90 tablet 3   levothyroxine (SYNTHROID) 88 MCG tablet TAKE 1 TABLET BY MOUTH ONCE DAILY **SKIP SUNDAY** 90 tablet 1   LOTEMAX 0.5 % ophthalmic suspension PLACE 1 DROP 4 TIMES A DAY FOR 1 WEEK, THEN 3 TIMES A DAY FOR 1 WEEK, THEN 2 TIMES A DAY FOR 1 WEEK, THEN 1 TIME A DAY FOR 1 WK, THEN STOP IN AFFECTED EYE 5 mL 1   metFORMIN (GLUCOPHAGE-XR) 750 MG 24 hr tablet TAKE 1 TABLET (750 MG TOTAL) BY MOUTH IN THE MORNING AND AT BEDTIME. 180 tablet 1   metoprolol succinate (TOPROL-XL) 25 MG 24 hr tablet TAKE 1 TABLET BY MOUTH EVERY MORNING AND EVERY NIGHT AT BEDTIME 180 tablet 1   mometasone (NASONEX) 50 MCG/ACT nasal spray USE ONE SPRAY IN EACH NOSTRIL TWICE DAILY IF NEEDED FOR STUFFY NOSE 51 g 3   NEXIUM 40 MG capsule TAKE  1 CAPSULE BY MOUTH ONCE DAILY 90 capsule 1   nirmatrelvir & ritonavir (PAXLOVID, 300/100,) 20 x 150 MG & 10 x 100MG  TBPK Take 3 tablets by mouth 2 (two) times daily for 5 days. 30 tablet 1   Probiotic Product (PROBIOTIC DAILY PO) Take 1 capsule by mouth daily.     spironolactone (ALDACTONE) 25 MG tablet TAKE 1 & 1/2 TABLETS BY MOUTH DAILY 135 tablet 1   benzonatate (TESSALON) 200 MG capsule Take 1 capsule (200 mg total) by mouth 3 (three) times daily as needed for cough. 20 capsule 0   loteprednol (LOTEMAX) 0.5 % ophthalmic suspension Place one drop into both eyes four times daily with flare up. 10 mL 3   No facility-administered medications prior to visit.    Allergies  Allergen Reactions   Codeine Anxiety    Jittery    Penicillins Itching   Sulfa Antibiotics Itching   Vicodin  [Hydrocodone-Acetaminophen] Itching    ITCHING     Review of Systems All review of systems negative except what is listed in the HPI     Objective:    Physical Exam Vitals reviewed.  Constitutional:      Appearance: Normal appearance.  Cardiovascular:     Rate and Rhythm: Normal rate and regular rhythm.  Pulmonary:     Effort: Pulmonary effort is normal.     Breath sounds: Normal breath sounds.  Musculoskeletal:        General: Normal range of motion.     Cervical back: Normal range of motion and neck supple.     Comments: Neck with full ROM, but discomfort to L upper traps with rotation over right shoulder; L upper trap with discomfort to deep palpation and muscle is tense, normal shoulder ROM  Skin:    General: Skin is warm and dry.     Findings: No bruising, erythema or rash.  Neurological:     General: No focal deficit present.     Mental Status: She is alert and oriented to person, place, and time. Mental status is at baseline.     Cranial Nerves: No cranial nerve deficit.     Motor: No weakness.     Coordination: Coordination normal.  Psychiatric:        Mood and Affect: Mood normal.        Behavior: Behavior normal.        Thought Content: Thought content normal.        Judgment: Judgment normal.    BP (!) 105/53 (BP Location: Left Arm, Cuff Size: Normal)    Pulse 73    Temp 98.1 F (36.7 C) (Oral)    Resp 12    Ht 5\' 5"  (1.651 m)    Wt 169 lb (76.7 kg)    SpO2 99%    BMI 28.12 kg/m  Wt Readings from Last 3 Encounters:  05/07/21 169 lb (76.7 kg)  03/13/21 170 lb (77.1 kg)  03/08/21 170 lb (77.1 kg)    Health Maintenance Due  Topic Date Due   FOOT EXAM  10/28/2018   COVID-19 Vaccine (5 - Booster for Pfizer series) 12/21/2020    There are no preventive care reminders to display for this patient.   Lab Results  Component Value Date   TSH 0.32 (L) 11/30/2020   Lab Results  Component Value Date   WBC 5.9 03/13/2021   HGB 13.8 03/13/2021   HCT 41.1  03/13/2021   MCV 93.8 03/13/2021   PLT 267 03/13/2021  Lab Results  Component Value Date   NA 130 (L) 03/13/2021   K 4.6 03/13/2021   CO2 25 03/13/2021   GLUCOSE 135 (H) 03/13/2021   BUN 15 03/13/2021   CREATININE 0.88 03/13/2021   BILITOT 0.4 02/07/2021   ALKPHOS 54 02/07/2021   AST 16 02/07/2021   ALT 17 02/07/2021   PROT 6.9 02/07/2021   ALBUMIN 4.6 02/07/2021   CALCIUM 9.3 03/13/2021   ANIONGAP 9 03/13/2021   GFR 62.77 02/07/2021   Lab Results  Component Value Date   CHOL 162 11/30/2020   Lab Results  Component Value Date   HDL 49.70 11/30/2020   Lab Results  Component Value Date   LDLCALC 83 11/30/2020   Lab Results  Component Value Date   TRIG 148.0 11/30/2020   Lab Results  Component Value Date   CHOLHDL 3 11/30/2020   Lab Results  Component Value Date   HGBA1C 6.7 (H) 11/30/2020       Assessment & Plan:   1. Neck muscle spasm Given her age, prefer to hold off on scheduled NSAIDs and muscle relaxer. Can try occasional ibuprofen or Aleve as needed. Educated on adverse effect to monitor for.  Recommending a few weeks of home exercises/stretches (handout provided), heating pad a few times per day, massage. If not improving in the next few weeks, consider formal physical therapy and/or sports medicine referral.  Patient aware of signs/symptoms requiring further/urgent evaluation.    Follow-up if not improving in the next 3 weeks or sooner if symptoms are worsening/changing.    Terrilyn Saver, NP

## 2021-05-08 ENCOUNTER — Ambulatory Visit (INDEPENDENT_AMBULATORY_CARE_PROVIDER_SITE_OTHER): Payer: Medicare Other | Admitting: Gastroenterology

## 2021-05-08 ENCOUNTER — Encounter: Payer: Self-pay | Admitting: Gastroenterology

## 2021-05-08 VITALS — BP 120/70 | HR 74 | Ht 65.0 in | Wt 171.0 lb

## 2021-05-08 DIAGNOSIS — Z9889 Other specified postprocedural states: Secondary | ICD-10-CM | POA: Diagnosis not present

## 2021-05-08 DIAGNOSIS — R1319 Other dysphagia: Secondary | ICD-10-CM | POA: Diagnosis not present

## 2021-05-08 DIAGNOSIS — K219 Gastro-esophageal reflux disease without esophagitis: Secondary | ICD-10-CM | POA: Diagnosis not present

## 2021-05-08 DIAGNOSIS — K449 Diaphragmatic hernia without obstruction or gangrene: Secondary | ICD-10-CM

## 2021-05-08 NOTE — Progress Notes (Signed)
[place] VISIT   Primary Care Provider Mosie Lukes, MD [address] [place]  [id number] [phone]  Patient Profile: Victoria Walker is a 71 y.o. female with a pmh significant for Diabetes, hypertension, hyperlipidemia, hypothyroidism, prior breast cancer, sciatica, GERD, hiatal hernia, colon polyps (TAs), H. pylori infection (confirmed eradication).  The patient presents to the [facility] for an evaluation and management of problem(s) noted below:  Problem List 1. Esophageal dysphagia   2. Hiatal hernia   3. Gastroesophageal reflux disease, unspecified whether esophagitis present   4. History of esophagogastroduodenoscopy (EGD)      History of Present Illness Please see prior notes for full details of HPI.  Interval History The patient returns for scheduled follow-up.  Over the course of the end of 2022, she began to experience more symptoms of slight dysphagia.  As such, she scheduled an endoscopy but had to cancel that as a result of COVID-19 infection.  She is doing better now few months out and is here to discuss her symptoms further.  She still experiences some symptoms of solid food dysphagia, especially meats and breads.  As she experienced a good effect after the last dilation that we performed in 2021 (results noted below (even though we did not get a mucosal  wrent), she would like to try another dilation.  She is not losing any weight unintentionally.  She is only adjusted her diet in a way such that she is chewing more thoroughly to try and minimize her symptoms.  She denies any odynophagia symptoms.  She remains on PPI therapy.  No other issues at this time per report.    GI Review of Systems Positive as above Negative for nausea, vomiting, alteration of bowel habits, melena, hematochezia   Review of Systems General: Denies fevers/chills/weight loss unintentionally Cardiovascular: Denies chest  pain Pulmonary: Denies shortness of breath Gastroenterological: See HPI Genitourinary: Denies darkened urine Hematological: Denies easy bruising/bleeding Dermatological: Denies jaundice Psychological: Mood is stable   Medications Current Outpatient Medications  Medication Sig Dispense Refill   aspirin 81 MG EC tablet Take 81 mg by mouth every other day. Swallow whole.     atorvastatin (LIPITOR) 40 MG tablet TAKE 1 TABLET BY MOUTH EVERY OTHER DAY, ALTERNATING WITH 1/2 TABLET 180 tablet 1   AVAPRO 75 MG tablet TAKE 1 TABLET (75 MG TOTAL) BY MOUTH DAILY. 90 tablet 1   Calcium Carbonate-Vitamin D (OS-CAL 500 + D PO)      Carboxymethylcellul-Glycerin (REFRESH OPTIVE OP) Apply to eye.     Cholecalciferol (VITAMIN D) 2000 UNITS CAPS Take 1 capsule by mouth every evening.      Ciclopirox 1 % shampoo apply to affected area(s) as directed twice a week 120 mL 11   clindamycin (CLEOCIN) 300 MG capsule      cycloSPORINE (RESTASIS) 0.05 % ophthalmic emulsion PLACE 1 DROP INTO EACH EYE TWO TIMES DAILY 180 mL 3   diltiazem (TIAZAC) 360 MG 24 hr capsule TAKE 1 CAPSULE (360 MG TOTAL) BY MOUTH DAILY. 90 capsule 1   Estradiol 10 MCG TABS vaginal tablet INSERT 1 TAB VAGINALLY AT BEDTIME EVERY OTHER DAY 48 tablet 4   gabapentin (NEURONTIN) 300 MG capsule TAKE 1 CAPSULE (300 MG TOTAL) BY MOUTH AT BEDTIME. 90 capsule 1   glucose blood (ONE TOUCH TEST STRIPS) test strip Use as directed twice daily to check blood sugar. DX code E11.9 300 each 1   levocetirizine (XYZAL) 5 MG tablet TAKE  1 TABLET (5 MG TOTAL) BY MOUTH EVERY EVENING. 90 tablet 3   levothyroxine (SYNTHROID) 88 MCG tablet TAKE 1 TABLET BY MOUTH ONCE DAILY **SKIP SUNDAY** 90 tablet 1   LOTEMAX 0.5 % ophthalmic suspension PLACE 1 DROP 4 TIMES A DAY FOR 1 WEEK, THEN 3 TIMES A DAY FOR 1 WEEK, THEN 2 TIMES A DAY FOR 1 WEEK, THEN 1 TIME A DAY FOR 1 WK, THEN STOP IN AFFECTED EYE 5 mL 1   metFORMIN (GLUCOPHAGE-XR) 750 MG 24 hr tablet TAKE 1 TABLET (750 MG  TOTAL) BY MOUTH IN THE MORNING AND AT BEDTIME. 180 tablet 1   metoprolol succinate (TOPROL-XL) 25 MG 24 hr tablet TAKE 1 TABLET BY MOUTH EVERY MORNING AND EVERY NIGHT AT BEDTIME 180 tablet 1   mometasone (NASONEX) 50 MCG/ACT nasal spray USE ONE SPRAY IN EACH NOSTRIL TWICE DAILY IF NEEDED FOR STUFFY NOSE 51 g 3   NEXIUM 40 MG capsule TAKE 1 CAPSULE BY MOUTH ONCE DAILY 90 capsule 1   Probiotic Product (PROBIOTIC DAILY PO) Take 1 capsule by mouth daily.     spironolactone (ALDACTONE) 25 MG tablet TAKE 1 & 1/2 TABLETS BY MOUTH DAILY 135 tablet 1   No current facility-administered medications for this visit.    Allergies Allergies  Allergen Reactions   Codeine Anxiety    Jittery    Penicillins Itching   Sulfa Antibiotics Itching   Vicodin [Hydrocodone-Acetaminophen] Itching    ITCHING     Histories Past Medical History:  Diagnosis Date   Arthritis    "both knees" (07/02/2015)   Cancer of right breast (HCC)    Claustrophobia    Dysrhythmia    HX OF FAST HEART RATE AND PALPITATIONS - METOPROLOL HAS HELPED   GERD (gastroesophageal reflux disease)    H/O hiatal hernia    H/O iritis    LEFT EYE - STATES HER EYE BECOMES RED AND VERY SENSITIVE TO LIGHT WHEN FLARE UP OF IRITIS   Heart murmur    "benign; 06/2015"   High cholesterol    History of blood transfusion    "when I had my partial hysterectomy"   History of chickenpox 09/26/2014   History of measles    History of MRSA infection    History of shingles    Hypertension    Hypothyroidism    Medicare annual wellness visit, initial 04/07/2015   Multinodular thyroid    PT HAVING TROUBLE SWALLOWING   Neuropathy    FEET   Primary localized osteoarthritis of right knee    Sciatica of right side 04/25/2015   Spinal headache    spinal headache with epidural   Total knee replacement status, right    Type II diabetes mellitus ([place])    ORAL MEDICATION - NO INSULIN   Wears glasses    Past Surgical History:  Procedure Laterality Date    ABDOMINAL HYSTERECTOMY  1988   ABDOMINOPLASTY  1986   ANTERIOR AND POSTERIOR REPAIR  05/11/2012   Procedure: ANTERIOR (CYSTOCELE) AND POSTERIOR REPAIR (RECTOCELE);  Surgeon: Reece Packer, MD;  Location: [facility];  Service: Urology;;  with graft   BREAST BIOPSY Right    CARDIAC CATHETERIZATION  1999   COLONOSCOPY     CYSTOSCOPY WITH URETHRAL DILATATION  05/11/2012   Procedure: CYSTOSCOPY WITH URETHRAL DILATATION;  Surgeon: Reece Packer, MD;  Location: [facility];  Service: Urology;;   ESOPHAGOGASTRODUODENOSCOPY     HERNIA REPAIR     MASTECTOMY Right 2007   PLANTAR FASCIA SURGERY Left  THYROIDECTOMY N/A 02/23/2014   Procedure: TOTAL THYROIDECTOMY;  Surgeon: Armandina Gemma, MD;  Location: [facility];  Service: General;  Laterality: N/A;   TOTAL KNEE ARTHROPLASTY Right 07/02/2015   TOTAL KNEE ARTHROPLASTY Right 07/02/2015   Procedure: TOTAL KNEE ARTHROPLASTY;  Surgeon: Elsie Saas, MD;  Location: [facility];  Service: Orthopedics;  Laterality: Right;   TOTAL THYROIDECTOMY  1976   removed three tumor (2 on right; 1 on left)   TUBAL LIGATION     UMBILICAL HERNIA REPAIR  1986   VAGINAL PROLAPSE REPAIR  05/11/2012   Procedure: VAGINAL VAULT SUSPENSION;  Surgeon: Reece Packer, MD;  Location: [facility];  Service: Urology;;   VULVA / PERINEUM BIOPSY     vulvar   Social History   Socioeconomic History   Marital status: Married    Spouse name: Not on file   Number of children: 3   Years of education: Not on file   Highest education level: Not on file  Occupational History    Comment: Retired  Tobacco Use   Smoking status: Never   Smokeless tobacco: Never  Vaping Use   Vaping Use: Never used  Substance and Sexual Activity   Alcohol use: No    Alcohol/week: 0.0 standard drinks   Drug use: No   Sexual activity: Not Currently    Birth control/protection: Surgical    Comment: lives with husband, retired from various jobs, diabetic diet  Other Topics Concern   Not on file  Social  History Narrative   Not on file   Social Determinants of Health   Financial Resource Strain: Low Risk    Difficulty of Paying Living Expenses: Not hard at all  Food Insecurity: No Food Insecurity   Worried About Charity fundraiser in the Last Year: Never true   Arboriculturist in the Last Year: Never true  Transportation Needs: No Transportation Needs   Lack of Transportation (Medical): No   Lack of Transportation (Non-Medical): No  Physical Activity: Insufficiently Active   Days of Exercise per Week: 2 days   Minutes of Exercise per Session: 20 min  Stress: No Stress Concern Present   Feeling of Stress : Not at all  Social Connections: Moderately Integrated   Frequency of Communication with Friends and Family: More than three times a week   Frequency of Social Gatherings with Friends and Family: More than three times a week   Attends Religious Services: More than 4 times per year   Active Member of [employer] or Organizations: No   Attends Music therapist: Never   Marital Status: Married  Human resources officer Violence: Not At Risk   Fear of Current or Ex-Partner: No   Emotionally Abused: No   Physically Abused: No   Sexually Abused: No   Family History  Problem Relation Age of Onset   Hypertension Mother    Heart disease Mother        pacer, CHF   Hyperlipidemia Mother    Kidney disease Mother        1 kidney due to stone   Kidney Stones Mother    Hypertension Father    Diabetes Father    Alcohol abuse Father    Lung cancer Father        lung, psa   Diabetes Sister    Hypertension Sister    Breast cancer Sister    Diabetes Brother    Hypertension Brother    Hyperlipidemia Brother    Kidney disease  Brother        dialysis   Stroke Brother    Hypertension Maternal Grandmother    Heart disease Maternal Grandmother    Stroke Maternal Grandmother    Hypertension Maternal Grandfather    Hypertension Daughter    Arthritis Son    Hyperlipidemia Sister     Hypertension Sister    Diabetes Sister    Thyroid disease Sister    Kidney disease Sister    Endometriosis Sister    Breast cancer Sister        thyroid and breast   Thyroid cancer Sister    Arthritis Sister        back pain   Hypertension Sister    Thyroid disease Sister    Hypertension Brother    Hyperlipidemia Brother    Hypertension Brother    Hyperlipidemia Brother    Benign prostatic hyperplasia Brother    Hypertension Daughter    Hyperlipidemia Daughter    Diabetes Daughter    Hypertension Maternal Aunt    Hypertension Maternal Uncle    Hypertension Paternal Aunt    Cancer Paternal Aunt    Hypertension Paternal Uncle    Colon cancer Neg Hx    Stomach cancer Neg Hx    Esophageal cancer Neg Hx    Pancreatic cancer Neg Hx    Liver disease Neg Hx    Rectal cancer Neg Hx    Inflammatory bowel disease Neg Hx    I have reviewed her medical, social, and family history in detail and updated the electronic medical record as necessary.    PHYSICAL EXAMINATION  BP 120/70    Pulse 74    Ht 5\' 5"  (1.651 m)    Wt 171 lb (77.6 kg)    BMI 28.46 kg/m  Wt Readings from Last 3 Encounters:  05/08/21 171 lb (77.6 kg)  05/07/21 169 lb (76.7 kg)  03/13/21 170 lb (77.1 kg)  GEN: NAD, appears stated age, doesn't appear chronically ill PSYCH: Cooperative, without pressured speech EYE: Conjunctivae pink, sclerae anicteric ENT: MMM CV: Nontachycardic RESP: No audible wheezing GI: NABS, soft, rounded, NT/ND, without rebound MSK/EXT: No significant lower extremity edema SKIN: No jaundice NEURO:  Alert & Oriented x 3, no focal deficits   REVIEW OF DATA  I reviewed the following data at the time of this encounter:  GI Procedures and Studies  October 2021 EGD - No gross lesions in esophagus. Biopsied. Dilated. - 4 cm hiatal hernia. - Gastritis. No other gross lesions in the stomach. Biopsied. - No gross lesions in the duodenal bulb, in the first portion of the duodenum and in the  second portion of the duodenum. Pathology Diagnosis 1. Surgical [P], random gastric - GASTRIC ANTRAL AND OXYNTIC MUCOSA WITH HELICOBACTER PYLORI-ASSOCIATED GASTRITIS (CONFIRMED WITH WARTHIN STARRY STAIN) 2. Surgical [P], random esophagus - ESOPHAGEAL SQUAMOUS MUCOSA WITH MILD VASCULAR CONGESTION, AND FOCAL SQUAMOUS BALLOONING, SUGGESTIVE OF REFLUX ESOPHAGITIS - NEGATIVE FOR INCREASED INTRAEPITHELIAL EOSINOPHILS 3. Surgical [P], colon, cecum, hepatic flexure, transverse, descending  Laboratory Studies  Reviewed those in epic and care everywhere  Imaging Studies  No relevant studies to review   ASSESSMENT  Ms. Veach is a 71 y.o. female with a pmh significant for Diabetes, hypertension, hyperlipidemia, hypothyroidism, prior breast cancer, sciatica, GERD, hiatal hernia, colon polyps (TAs), H. pylori infection (confirmed eradication).  The patient is seen today for evaluation and management of:  1. Esophageal dysphagia   2. Hiatal hernia   3. Gastroesophageal reflux disease, unspecified whether esophagitis present  4. History of esophagogastroduodenoscopy (EGD)    The patient is hemodynamically stable.  Clinically, she is experiencing increased episodes of solid food dysphagia.  We had previously considered the role of manometry but since she did so well for such a long period of time after her empiric dilation, we had planned a repeat dilation at the end of 2022.  This was postponed as a result of COVID infection.  She is now better and would like to move forward with an empiric dilation.  We will try to see if we can do a repeat 18 or 19 or 20 mm savory dilation.  If we see evidence of a Schatzki ring we may try to disrupt that as well.  If the patient does not have a long-lasting improvement after dilation, then manometry will need to be scheduled thereafter.  The risks and benefits of endoscopic evaluation were discussed with the patient; these include but are not limited to the risk of  perforation, infection, bleeding, missed lesions, lack of diagnosis, severe illness requiring hospitalization, as well as anesthesia and sedation related illnesses.  The patient and/or family is agreeable to proceed.  We will maintain her current PPI therapy for now all patient questions were answered to the best of my ability, and the patient agrees to the aforementioned plan of action with follow-up as indicated.   PLAN  Proceed with scheduling EGD with repeat dilation (18 or 19 or 20 mm savory) If repeat dilation does not improve symptoms for a significant amount of time or no improvement then esophageal manometry will need to be considered Continue Nexium 40 mg daily Surveillance colonoscopy in 2024   Orders Placed This Encounter  Procedures   Ambulatory referral to Gastroenterology    New Prescriptions   No medications on file   Modified Medications   No medications on file    Planned Follow Up No follow-ups on file.   Total Time in Face-to-Face and in Coordination of Care for patient including independent/personal interpretation/review of prior testing, medical history, examination, medication adjustment, communicating results with the patient directly, and documentation with the EHR is 25 minutes.  Justice Britain, MD [facility] Office # [phone]

## 2021-05-08 NOTE — Patient Instructions (Signed)
You have been scheduled for an endoscopy. Please follow written instructions given to you at your visit today. If you use inhalers (even only as needed), please bring them with you on the day of your procedure.  If you are age 72 or older, your body mass index should be between 23-30. Your Body mass index is 28.46 kg/m. If this is out of the aforementioned range listed, please consider follow up with your Primary Care Provider.  The [place] [facility] providers would like to encourage you to use [facility] to communicate with providers for non-urgent requests or questions.  Due to long hold times on the telephone, sending your provider a message by [facility] may be a faster and more efficient way to get a response.  Please allow 48 business hours for a response.  Please remember that this is for non-urgent requests.   Due to recent changes in healthcare laws, you may see the results of your imaging and laboratory studies on MyChart before your provider has had a chance to review them.  We understand that in some cases there may be results that are confusing or concerning to you. Not all laboratory results come back in the same time frame and the provider may be waiting for multiple results in order to interpret others.  Please give [place] 48 hours in order for your provider to thoroughly review all the results before contacting the office for clarification of your results.    Thank you for choosing me and [facility].  Dr. Rush Landmark

## 2021-05-11 ENCOUNTER — Encounter: Payer: Self-pay | Admitting: Gastroenterology

## 2021-05-15 DIAGNOSIS — M1712 Unilateral primary osteoarthritis, left knee: Secondary | ICD-10-CM | POA: Diagnosis not present

## 2021-05-15 DIAGNOSIS — G8929 Other chronic pain: Secondary | ICD-10-CM | POA: Diagnosis not present

## 2021-05-22 ENCOUNTER — Other Ambulatory Visit (HOSPITAL_BASED_OUTPATIENT_CLINIC_OR_DEPARTMENT_OTHER): Payer: Self-pay

## 2021-05-22 ENCOUNTER — Other Ambulatory Visit: Payer: Self-pay | Admitting: Family Medicine

## 2021-05-22 ENCOUNTER — Other Ambulatory Visit: Payer: Self-pay

## 2021-05-22 ENCOUNTER — Telehealth: Payer: Self-pay | Admitting: Family Medicine

## 2021-05-22 MED ORDER — AVAPRO 75 MG PO TABS
75.0000 mg | ORAL_TABLET | Freq: Every day | ORAL | 1 refills | Status: DC
  Filled 2021-05-22: qty 90, 90d supply, fill #0
  Filled 2021-09-02: qty 90, 90d supply, fill #1

## 2021-05-22 MED FILL — Mometasone Furoate Nasal Susp 50 MCG/ACT: NASAL | 90 days supply | Qty: 51 | Fill #3 | Status: AC

## 2021-05-22 NOTE — Telephone Encounter (Signed)
Patient would like her AVAPRO 75 MG tablet  send to the pharmacy with the brand name. She states she just spoke to the pharmacy and that the prescription has to be written with the brand name or else they will change the medication to a generics brand. Please advise.

## 2021-05-23 ENCOUNTER — Other Ambulatory Visit: Payer: Self-pay | Admitting: Gastroenterology

## 2021-05-23 ENCOUNTER — Other Ambulatory Visit (HOSPITAL_BASED_OUTPATIENT_CLINIC_OR_DEPARTMENT_OTHER): Payer: Self-pay

## 2021-05-23 ENCOUNTER — Ambulatory Visit (AMBULATORY_SURGERY_CENTER): Payer: Medicare Other | Admitting: Gastroenterology

## 2021-05-23 ENCOUNTER — Encounter: Payer: Self-pay | Admitting: Gastroenterology

## 2021-05-23 VITALS — BP 149/81 | HR 64 | Temp 96.0°F | Resp 16 | Ht 65.0 in | Wt 171.0 lb

## 2021-05-23 DIAGNOSIS — R1310 Dysphagia, unspecified: Secondary | ICD-10-CM | POA: Diagnosis not present

## 2021-05-23 DIAGNOSIS — B9681 Helicobacter pylori [H. pylori] as the cause of diseases classified elsewhere: Secondary | ICD-10-CM

## 2021-05-23 DIAGNOSIS — K222 Esophageal obstruction: Secondary | ICD-10-CM

## 2021-05-23 DIAGNOSIS — K2950 Unspecified chronic gastritis without bleeding: Secondary | ICD-10-CM | POA: Diagnosis not present

## 2021-05-23 DIAGNOSIS — K2970 Gastritis, unspecified, without bleeding: Secondary | ICD-10-CM

## 2021-05-23 DIAGNOSIS — R1319 Other dysphagia: Secondary | ICD-10-CM | POA: Diagnosis not present

## 2021-05-23 DIAGNOSIS — K449 Diaphragmatic hernia without obstruction or gangrene: Secondary | ICD-10-CM

## 2021-05-23 DIAGNOSIS — K219 Gastro-esophageal reflux disease without esophagitis: Secondary | ICD-10-CM

## 2021-05-23 MED ORDER — SODIUM CHLORIDE 0.9 % IV SOLN: 500.0000 mL | Freq: Once | INTRAVENOUS | Status: DC

## 2021-05-23 NOTE — Progress Notes (Signed)
Per Dr. Rush Landmark- pt is to be NPO for 1 hour, clear liquids for 2 hours, then soft foods the rest of today  F/U OV in 6 weeks- made for March 10th at 1:50 pm

## 2021-05-23 NOTE — Progress Notes (Signed)
Called to room to assist during endoscopic procedure.  Patient ID and intended procedure confirmed with present staff. Received instructions for my participation in the procedure from the performing physician.  

## 2021-05-23 NOTE — Patient Instructions (Signed)
FOLLOW DILATION DIET TODAY!!  SEE HANDOUT  AWAIT PATHOLOGY   Please read over handouts about gastritis, hiatal hernia, and stricture Continue medications- please use Cepacol or Hall Lozenges or Chloraseptic spray for next 72-96 hours to aid in sore throat if needed   YOU HAD AN ENDOSCOPIC PROCEDURE TODAY AT [place]:   Refer to the procedure report that was given to you for any specific questions about what was found during the examination.  If the procedure report does not answer your questions, please call your gastroenterologist to clarify.  If you requested that your care partner not be given the details of your procedure findings, then the procedure report has been included in a sealed envelope for you to review at your convenience later.  YOU SHOULD EXPECT: Some feelings of bloating in the abdomen. Passage of more gas than usual.  Walking can help get rid of the air that was put into your GI tract during the procedure and reduce the bloating. .  Please Note:  You might notice some irritation and congestion in your nose or some drainage.  This is from the oxygen used during your procedure.  There is no need for concern and it should clear up in a day or so.  SYMPTOMS TO REPORT IMMEDIATELY: Following upper endoscopy (EGD)  Vomiting of blood or coffee ground material  New chest pain or pain under the shoulder blades  Painful or persistently difficult swallowing  New shortness of breath  Fever of 100F or higher  Black, tarry-looking stools  For urgent or emergent issues, a gastroenterologist can be reached at any hour by calling [phone]. Do not use MyChart messaging for urgent concerns.    DIET:  FOLLOW DILATION DIET!!! Drink plenty of fluids but you should avoid alcoholic beverages for 24 hours.  ACTIVITY:  You should plan to take it easy for the rest of today and you should NOT DRIVE or use heavy machinery until tomorrow (because of the sedation medicines  used during the test).    FOLLOW UP: Our staff will call the number listed on your records 48-72 hours following your procedure to check on you and address any questions or concerns that you may have regarding the information given to you following your procedure. If we do not reach you, we will leave a message.  We will attempt to reach you two times.  During this call, we will ask if you have developed any symptoms of COVID 19. If you develop any symptoms (ie: fever, flu-like symptoms, shortness of breath, cough etc.) before then, please call [phone].  If you test positive for Covid 19 in the 2 weeks post procedure, please call and report this information to [place].    If any biopsies were taken you will be contacted by phone or by letter within the next 1-3 weeks.  Please call us at [phone] if you have not heard about the biopsies in 3 weeks.    SIGNATURES/CONFIDENTIALITY: You and/or your care partner have signed paperwork which will be entered into your electronic medical record.  These signatures attest to the fact that that the information above on your After Visit Summary has been reviewed and is understood.  Full responsibility of the confidentiality of this discharge information lies with you and/or your care-partner.

## 2021-05-23 NOTE — Progress Notes (Signed)
Pt in recovery with monitors in place, VSS. Report given to receiving RN. Bite guard was placed with pt awake to ensure comfort. No dental or soft tissue damage noted. 

## 2021-05-23 NOTE — Telephone Encounter (Signed)
done

## 2021-05-23 NOTE — Progress Notes (Signed)
GASTROENTEROLOGY PROCEDURE H&P NOTE   Primary Care Physician: Mosie Lukes, MD  HPI: Victoria Walker is a 71 y.o. female who presents for EGD for follow up of dysphagia with prior benefit from empiric dilation.  Past Medical History:  Diagnosis Date   Arthritis    "both knees" (07/02/2015)   Cancer of right breast ([place])    Claustrophobia    Dysrhythmia    HX OF FAST HEART RATE AND PALPITATIONS - METOPROLOL HAS HELPED   GERD (gastroesophageal reflux disease)    H/O hiatal hernia    H/O iritis    LEFT EYE - STATES HER EYE BECOMES RED AND VERY SENSITIVE TO LIGHT WHEN FLARE UP OF IRITIS   Heart murmur    "benign; 06/2015"   High cholesterol    History of blood transfusion    "when I had my partial hysterectomy"   History of chickenpox 09/26/2014   History of measles    History of MRSA infection    History of shingles    Hypertension    Hypothyroidism    Medicare annual wellness visit, initial 04/07/2015   Multinodular thyroid    PT HAVING TROUBLE SWALLOWING   Neuropathy    FEET   Primary localized osteoarthritis of right knee    Sciatica of right side 04/25/2015   Spinal headache    spinal headache with epidural   Total knee replacement status, right    Type II diabetes mellitus ([place])    ORAL MEDICATION - NO INSULIN   Wears glasses    Past Surgical History:  Procedure Laterality Date   ABDOMINAL HYSTERECTOMY  1988   ABDOMINOPLASTY  1986   ANTERIOR AND POSTERIOR REPAIR  05/11/2012   Procedure: ANTERIOR (CYSTOCELE) AND POSTERIOR REPAIR (RECTOCELE);  Surgeon: Reece Packer, MD;  Location: [facility];  Service: Urology;;  with graft   BREAST BIOPSY Right    CARDIAC CATHETERIZATION  1999   COLONOSCOPY     CYSTOSCOPY WITH URETHRAL DILATATION  05/11/2012   Procedure: CYSTOSCOPY WITH URETHRAL DILATATION;  Surgeon: Reece Packer, MD;  Location: [facility];  Service: Urology;;   ESOPHAGOGASTRODUODENOSCOPY     HERNIA REPAIR     MASTECTOMY Right 2007   PLANTAR FASCIA  SURGERY Left    THYROIDECTOMY N/A 02/23/2014   Procedure: TOTAL THYROIDECTOMY;  Surgeon: Armandina Gemma, MD;  Location: [facility];  Service: General;  Laterality: N/A;   TOTAL KNEE ARTHROPLASTY Right 07/02/2015   TOTAL KNEE ARTHROPLASTY Right 07/02/2015   Procedure: TOTAL KNEE ARTHROPLASTY;  Surgeon: Elsie Saas, MD;  Location: [facility];  Service: Orthopedics;  Laterality: Right;   TOTAL THYROIDECTOMY  1976   removed three tumor (2 on right; 1 on left)   TUBAL LIGATION     UMBILICAL HERNIA REPAIR  1986   VAGINAL PROLAPSE REPAIR  05/11/2012   Procedure: VAGINAL VAULT SUSPENSION;  Surgeon: Reece Packer, MD;  Location: [facility];  Service: Urology;;   VULVA / PERINEUM BIOPSY     vulvar   Current Outpatient Medications  Medication Sig Dispense Refill   aspirin 81 MG EC tablet Take 81 mg by mouth every other day. Swallow whole.     atorvastatin (LIPITOR) 40 MG tablet TAKE 1 TABLET BY MOUTH EVERY OTHER DAY, ALTERNATING WITH 1/2 TABLET 180 tablet 1   AVAPRO 75 MG tablet TAKE 1 TABLET (75 MG TOTAL) BY MOUTH DAILY. 90 tablet 1   Calcium Carbonate-Vitamin D (OS-CAL 500 + D PO)      Carboxymethylcellul-Glycerin (REFRESH  OPTIVE OP) Apply to eye.     Cholecalciferol (VITAMIN D) 2000 UNITS CAPS Take 1 capsule by mouth every evening.      Ciclopirox 1 % shampoo apply to affected area(s) as directed twice a week 120 mL 11   clindamycin (CLEOCIN) 300 MG capsule      diltiazem (TIAZAC) 360 MG 24 hr capsule TAKE 1 CAPSULE (360 MG TOTAL) BY MOUTH DAILY. 90 capsule 1   Estradiol 10 MCG TABS vaginal tablet INSERT 1 TAB VAGINALLY AT BEDTIME EVERY OTHER DAY 48 tablet 4   gabapentin (NEURONTIN) 300 MG capsule TAKE 1 CAPSULE (300 MG TOTAL) BY MOUTH AT BEDTIME. 90 capsule 1   glucose blood (ONE TOUCH TEST STRIPS) test strip Use as directed twice daily to check blood sugar. DX code E11.9 300 each 1   levocetirizine (XYZAL) 5 MG tablet TAKE 1 TABLET (5 MG TOTAL) BY MOUTH EVERY EVENING. 90 tablet 3   levothyroxine  (SYNTHROID) 88 MCG tablet TAKE 1 TABLET BY MOUTH ONCE DAILY **SKIP SUNDAY** 90 tablet 1   metFORMIN (GLUCOPHAGE-XR) 750 MG 24 hr tablet TAKE 1 TABLET (750 MG TOTAL) BY MOUTH IN THE MORNING AND AT BEDTIME. 180 tablet 1   metoprolol succinate (TOPROL-XL) 25 MG 24 hr tablet TAKE 1 TABLET BY MOUTH EVERY MORNING AND EVERY NIGHT AT BEDTIME 180 tablet 1   mometasone (NASONEX) 50 MCG/ACT nasal spray USE ONE SPRAY IN EACH NOSTRIL TWICE DAILY IF NEEDED FOR STUFFY NOSE 51 g 3   NEXIUM 40 MG capsule TAKE 1 CAPSULE BY MOUTH ONCE DAILY 90 capsule 1   Probiotic Product (PROBIOTIC DAILY PO) Take 1 capsule by mouth daily.     spironolactone (ALDACTONE) 25 MG tablet TAKE 1 & 1/2 TABLETS BY MOUTH DAILY 135 tablet 1   Current Facility-Administered Medications  Medication Dose Route Frequency Provider Last Rate Last Admin   0.9 %  sodium chloride infusion  500 mL Intravenous Once Mansouraty, Telford Nab., MD        Current Outpatient Medications:    aspirin 81 MG EC tablet, Take 81 mg by mouth every other day. Swallow whole., Disp: , Rfl:    atorvastatin (LIPITOR) 40 MG tablet, TAKE 1 TABLET BY MOUTH EVERY OTHER DAY, ALTERNATING WITH 1/2 TABLET, Disp: 180 tablet, Rfl: 1   AVAPRO 75 MG tablet, TAKE 1 TABLET (75 MG TOTAL) BY MOUTH DAILY., Disp: 90 tablet, Rfl: 1   Calcium Carbonate-Vitamin D (OS-CAL 500 + D PO), , Disp: , Rfl:    Carboxymethylcellul-Glycerin (REFRESH OPTIVE OP), Apply to eye., Disp: , Rfl:    Cholecalciferol (VITAMIN D) 2000 UNITS CAPS, Take 1 capsule by mouth every evening. , Disp: , Rfl:    Ciclopirox 1 % shampoo, apply to affected area(s) as directed twice a week, Disp: 120 mL, Rfl: 11   clindamycin (CLEOCIN) 300 MG capsule, , Disp: , Rfl:    diltiazem (TIAZAC) 360 MG 24 hr capsule, TAKE 1 CAPSULE (360 MG TOTAL) BY MOUTH DAILY., Disp: 90 capsule, Rfl: 1   Estradiol 10 MCG TABS vaginal tablet, INSERT 1 TAB VAGINALLY AT BEDTIME EVERY OTHER DAY, Disp: 48 tablet, Rfl: 4   gabapentin (NEURONTIN) 300  MG capsule, TAKE 1 CAPSULE (300 MG TOTAL) BY MOUTH AT BEDTIME., Disp: 90 capsule, Rfl: 1   glucose blood (ONE TOUCH TEST STRIPS) test strip, Use as directed twice daily to check blood sugar. DX code E11.9, Disp: 300 each, Rfl: 1   levocetirizine (XYZAL) 5 MG tablet, TAKE 1 TABLET (5 MG TOTAL) BY  MOUTH EVERY EVENING., Disp: 90 tablet, Rfl: 3   levothyroxine (SYNTHROID) 88 MCG tablet, TAKE 1 TABLET BY MOUTH ONCE DAILY **SKIP SUNDAY**, Disp: 90 tablet, Rfl: 1   metFORMIN (GLUCOPHAGE-XR) 750 MG 24 hr tablet, TAKE 1 TABLET (750 MG TOTAL) BY MOUTH IN THE MORNING AND AT BEDTIME., Disp: 180 tablet, Rfl: 1   metoprolol succinate (TOPROL-XL) 25 MG 24 hr tablet, TAKE 1 TABLET BY MOUTH EVERY MORNING AND EVERY NIGHT AT BEDTIME, Disp: 180 tablet, Rfl: 1   mometasone (NASONEX) 50 MCG/ACT nasal spray, USE ONE SPRAY IN EACH NOSTRIL TWICE DAILY IF NEEDED FOR STUFFY NOSE, Disp: 51 g, Rfl: 3   NEXIUM 40 MG capsule, TAKE 1 CAPSULE BY MOUTH ONCE DAILY, Disp: 90 capsule, Rfl: 1   Probiotic Product (PROBIOTIC DAILY PO), Take 1 capsule by mouth daily., Disp: , Rfl:    spironolactone (ALDACTONE) 25 MG tablet, TAKE 1 & 1/2 TABLETS BY MOUTH DAILY, Disp: 135 tablet, Rfl: 1  Current Facility-Administered Medications:    0.9 %  sodium chloride infusion, 500 mL, Intravenous, Once, Mansouraty, Telford Nab., MD Allergies  Allergen Reactions   Codeine Anxiety    Jittery    Penicillins Itching   Sulfa Antibiotics Itching   Vicodin [Hydrocodone-Acetaminophen] Itching    ITCHING    Family History  Problem Relation Age of Onset   Hypertension Mother    Heart disease Mother        pacer, CHF   Hyperlipidemia Mother    Kidney disease Mother        1 kidney due to stone   Kidney Stones Mother    Hypertension Father    Diabetes Father    Alcohol abuse Father    Lung cancer Father        lung, psa   Diabetes Sister    Hypertension Sister    Breast cancer Sister    Diabetes Brother    Hypertension Brother     Hyperlipidemia Brother    Kidney disease Brother        dialysis   Stroke Brother    Hypertension Maternal Grandmother    Heart disease Maternal Grandmother    Stroke Maternal Grandmother    Hypertension Maternal Grandfather    Hypertension Daughter    Arthritis Son    Hyperlipidemia Sister    Hypertension Sister    Diabetes Sister    Thyroid disease Sister    Kidney disease Sister    Endometriosis Sister    Breast cancer Sister        thyroid and breast   Thyroid cancer Sister    Arthritis Sister        back pain   Hypertension Sister    Thyroid disease Sister    Hypertension Brother    Hyperlipidemia Brother    Hypertension Brother    Hyperlipidemia Brother    Benign prostatic hyperplasia Brother    Hypertension Daughter    Hyperlipidemia Daughter    Diabetes Daughter    Hypertension Maternal Aunt    Hypertension Maternal Uncle    Hypertension Paternal Aunt    Cancer Paternal Aunt    Hypertension Paternal Uncle    Colon cancer Neg Hx    Stomach cancer Neg Hx    Esophageal cancer Neg Hx    Pancreatic cancer Neg Hx    Liver disease Neg Hx    Rectal cancer Neg Hx    Inflammatory bowel disease Neg Hx    Social History   Socioeconomic History   Marital  status: Married    Spouse name: Not on file   Number of children: 3   Years of education: Not on file   Highest education level: Not on file  Occupational History    Comment: Retired  Tobacco Use   Smoking status: Never   Smokeless tobacco: Never  Vaping Use   Vaping Use: Never used  Substance and Sexual Activity   Alcohol use: No    Alcohol/week: 0.0 standard drinks   Drug use: No   Sexual activity: Not Currently    Birth control/protection: Surgical    Comment: lives with husband, retired from various jobs, diabetic diet  Other Topics Concern   Not on file  Social History Narrative   Not on file   Social Determinants of Health   Financial Resource Strain: Low Risk    Difficulty of Paying Living  Expenses: Not hard at all  Food Insecurity: No Food Insecurity   Worried About Charity fundraiser in the Last Year: Never true   Arboriculturist in the Last Year: Never true  Transportation Needs: No Transportation Needs   Lack of Transportation (Medical): No   Lack of Transportation (Non-Medical): No  Physical Activity: Insufficiently Active   Days of Exercise per Week: 2 days   Minutes of Exercise per Session: 20 min  Stress: No Stress Concern Present   Feeling of Stress : Not at all  Social Connections: Moderately Integrated   Frequency of Communication with Friends and Family: More than three times a week   Frequency of Social Gatherings with Friends and Family: More than three times a week   Attends Religious Services: More than 4 times per year   Active Member of [employer] or Organizations: No   Attends Archivist Meetings: Never   Marital Status: Married  Human resources officer Violence: Not At Risk   Fear of Current or Ex-Partner: No   Emotionally Abused: No   Physically Abused: No   Sexually Abused: No    Physical Exam: Today's Vitals   05/23/21 0904  BP: 128/70  Pulse: 67  Temp: (!) 96 F (35.6 C)  TempSrc: Temporal  SpO2: 99%  Weight: 171 lb (77.6 kg)  Height: 5\' 5"  (1.651 m)   Body mass index is 28.46 kg/m. GEN: NAD EYE: Sclerae anicteric ENT: MMM CV: Non-tachycardic GI: Soft, NT/ND NEURO:  Alert & Oriented x 3  Lab Results: No results for input(s): WBC, HGB, HCT, PLT in the last 72 hours. BMET No results for input(s): NA, K, CL, CO2, GLUCOSE, BUN, CREATININE, CALCIUM in the last 72 hours. LFT No results for input(s): PROT, ALBUMIN, AST, ALT, ALKPHOS, BILITOT, BILIDIR, IBILI in the last 72 hours. PT/INR No results for input(s): LABPROT, INR in the last 72 hours.   Impression / Plan: This is a 71 y.o.female who presents for EGD for follow up of dysphagia with prior benefit from empiric dilation.  The risks and benefits of endoscopic  evaluation/treatment were discussed with the patient and/or family; these include but are not limited to the risk of perforation, infection, bleeding, missed lesions, lack of diagnosis, severe illness requiring hospitalization, as well as anesthesia and sedation related illnesses.  The patient's history has been reviewed, patient examined, no change in status, and deemed stable for procedure.  The patient and/or family is agreeable to proceed.    Justice Britain, MD [facility] Office # [phone]

## 2021-05-23 NOTE — Op Note (Signed)
[facility] Patient Name: Victoria Walker Procedure Date: 05/23/2021 9:13 AM MRN: [id number] Endoscopist: Justice Britain , MD Age: 71 Referring MD:  Date of Birth: September 23, 1950 Gender: Female Account #: [id number] Procedure:                Upper GI endoscopy Indications:              Dysphagia Medicines:                Monitored Anesthesia Care Procedure:                Pre-Anesthesia Assessment:                           - Prior to the procedure, a History and Physical                            was performed, and patient medications and                            allergies were reviewed. The patient's tolerance of                            previous anesthesia was also reviewed. The risks                            and benefits of the procedure and the sedation                            options and risks were discussed with the patient.                            All questions were answered, and informed consent                            was obtained. Prior Anticoagulants: The patient has                            taken no previous anticoagulant or antiplatelet                            agents except for aspirin. ASA Grade Assessment:                            III - A patient with severe systemic disease. After                            reviewing the risks and benefits, the patient was                            deemed in satisfactory condition to undergo the                            procedure.  After obtaining informed consent, the endoscope was                            passed under direct vision. Throughout the                            procedure, the patient's blood pressure, pulse, and                            oxygen saturations were monitored continuously. The                            Endoscope was introduced through the mouth, and                            advanced to the second part of duodenum. The upper                             GI endoscopy was accomplished without difficulty.                            The patient tolerated the procedure. Scope In: Scope Out: Findings:                 No gross mucosal lesions were noted in the entire                            esophagus.                           A non-obstructing Schatzki ring was found at the                            gastroesophageal junction at 35 cm. The Schatzki                            ring was disrupted circumferentially with biopsy                            forceps.                           After the rest of the EGD was complete, a guidewire                            was placed and the scope was withdrawn. Dilation                            was performed in the esophagus with a Savary                            dilator with mild resistance at 18 mm. The dilation                            site  was examined following endoscope reinsertion                            and showed mild mucosal disruption/wrent just below                            the UES and mild improvement in luminal narrowing.                           A 2 cm hiatal hernia was present.                           Scattered mild inflammation characterized by                            erythema as well as some nodular gastritis was                            found in the entire examined stomach. Biopsies were                            taken with a cold forceps for histology and                            Helicobacter pylori testing.                           No gross lesions were noted in the duodenal bulb,                            in the first portion of the duodenum and in the                            second portion of the duodenum. Complications:            No immediate complications. Estimated Blood Loss:     Estimated blood loss was minimal. Impression:               - No gross mucosal lesions in esophagus.                            Non-obstructing Schatzki ring  at GE Jxn -                            disrupted. Savary dilation performed with mild                            mucosal wrent just below the UES noted.                           - 2 cm hiatal hernia.                           - Gastritis. Biopsied.                           -  No gross lesions in the duodenal bulb, in the                            first portion of the duodenum and in the second                            portion of the duodenum. Recommendation:           - The patient will be observed post-procedure,                            until all discharge criteria are met.                           - Discharge patient to home.                           - Patient has a contact number available for                            emergencies. The signs and symptoms of potential                            delayed complications were discussed with the                            patient. Return to normal activities tomorrow.                            Written discharge instructions were provided to the                            patient.                           - Dilation diet as per protocol.                           - Please use Cepacol or Halls Lozenges +/-                            Chloraseptic spray for next 72-96 hours to aid in                            sore thoat should you experience this.                           - Continue present medications.                           - Await pathology results.                           - If dysphagia symptoms recur quickly then next  steps will be formal manometry as previously                            discussed. If however, patient symptoms are in                            check for a significant time period, then consider                            repeat upper endoscopy PRN for retreatment with                            Savary and possible CRE distal Schatzki ring                            dilation.                            - The findings and recommendations were discussed                            with the patient.                           - The findings and recommendations were discussed                            with the patient's family. Justice Britain, MD 05/23/2021 9:56:54 AM

## 2021-05-24 ENCOUNTER — Other Ambulatory Visit (HOSPITAL_BASED_OUTPATIENT_CLINIC_OR_DEPARTMENT_OTHER): Payer: Self-pay

## 2021-05-27 ENCOUNTER — Telehealth: Payer: Self-pay

## 2021-05-27 ENCOUNTER — Other Ambulatory Visit (HOSPITAL_BASED_OUTPATIENT_CLINIC_OR_DEPARTMENT_OTHER): Payer: Self-pay

## 2021-05-27 MED ORDER — CLINDAMYCIN HCL 300 MG PO CAPS
ORAL_CAPSULE | ORAL | 0 refills | Status: DC
  Filled 2021-05-27: qty 6, 3d supply, fill #0

## 2021-05-27 MED ORDER — CYCLOSPORINE 0.05 % OP EMUL
OPHTHALMIC | 3 refills | Status: DC
  Filled 2021-05-27: qty 60, 30d supply, fill #0

## 2021-05-27 NOTE — Telephone Encounter (Signed)
°  Follow up Call-  Call back number 05/23/2021 02/14/2020  Post procedure Call Back phone  # [phone]  Permission to leave phone message Yes Yes  Some recent data might be hidden     Patient questions:  Do you have a fever, pain , or abdominal swelling? No. Pain Score  0 *  Have you tolerated food without any problems? Yes.    Have you been able to return to your normal activities? Yes.    Do you have any questions about your discharge instructions: Diet   No. Medications  No. Follow up visit  No.  Do you have questions or concerns about your Care? No.  Actions: * If pain score is 4 or above: No action needed, pain <4.

## 2021-05-28 ENCOUNTER — Encounter: Payer: Self-pay | Admitting: Gastroenterology

## 2021-05-28 ENCOUNTER — Other Ambulatory Visit (HOSPITAL_BASED_OUTPATIENT_CLINIC_OR_DEPARTMENT_OTHER): Payer: Self-pay

## 2021-05-28 ENCOUNTER — Other Ambulatory Visit: Payer: Self-pay

## 2021-05-28 ENCOUNTER — Telehealth: Payer: Self-pay

## 2021-05-28 ENCOUNTER — Ambulatory Visit: Payer: Medicare Other | Admitting: Family Medicine

## 2021-05-28 ENCOUNTER — Telehealth: Payer: Self-pay | Admitting: Podiatry

## 2021-05-28 MED ORDER — LOTEPREDNOL ETABONATE 0.5 % OP SUSP
OPHTHALMIC | 3 refills | Status: DC
  Filled 2021-05-28: qty 10, 25d supply, fill #0
  Filled 2021-09-02: qty 10, 25d supply, fill #1
  Filled 2021-12-13: qty 10, 25d supply, fill #2
  Filled 2022-03-19: qty 10, 25d supply, fill #3

## 2021-05-28 MED ORDER — CLARITHROMYCIN 500 MG PO TABS
500.0000 mg | ORAL_TABLET | Freq: Two times a day (BID) | ORAL | 0 refills | Status: DC
  Filled 2021-05-28: qty 28, 14d supply, fill #0

## 2021-05-28 MED ORDER — BISMUTH SUBSALICYLATE 262 MG PO TABS
2.0000 | ORAL_TABLET | Freq: Four times a day (QID) | ORAL | 0 refills | Status: AC
  Filled 2021-05-28: qty 120, 15d supply, fill #0

## 2021-05-28 MED ORDER — TETRACYCLINE HCL 500 MG PO CAPS
500.0000 mg | ORAL_CAPSULE | Freq: Four times a day (QID) | ORAL | 0 refills | Status: AC
  Filled 2021-05-28: qty 56, 14d supply, fill #0

## 2021-05-28 NOTE — Telephone Encounter (Signed)
Diabetic shoes/inserts in.. lvm for pt to call to schedule an appt to pick them up and it has to be this week as authorization expires 2.12.23.Marland Kitchen

## 2021-05-28 NOTE — Progress Notes (Signed)
Patty, lets try the following salvage therapy: -PPI 40 mg twice daily -Clarithromycin 500 mg twice daily -Bismuth 525 mg 4 times daily -Tetracycline 500 mg 4 times daily  Thanks. GM

## 2021-05-29 ENCOUNTER — Other Ambulatory Visit (HOSPITAL_BASED_OUTPATIENT_CLINIC_OR_DEPARTMENT_OTHER): Payer: Self-pay

## 2021-05-29 ENCOUNTER — Other Ambulatory Visit: Payer: Self-pay

## 2021-05-29 ENCOUNTER — Ambulatory Visit (INDEPENDENT_AMBULATORY_CARE_PROVIDER_SITE_OTHER): Payer: Medicare Other

## 2021-05-29 DIAGNOSIS — M2142 Flat foot [pes planus] (acquired), left foot: Secondary | ICD-10-CM

## 2021-05-29 DIAGNOSIS — M2041 Other hammer toe(s) (acquired), right foot: Secondary | ICD-10-CM

## 2021-05-29 DIAGNOSIS — M2141 Flat foot [pes planus] (acquired), right foot: Secondary | ICD-10-CM | POA: Diagnosis not present

## 2021-05-29 DIAGNOSIS — E1140 Type 2 diabetes mellitus with diabetic neuropathy, unspecified: Secondary | ICD-10-CM | POA: Diagnosis not present

## 2021-05-29 DIAGNOSIS — M2042 Other hammer toe(s) (acquired), left foot: Secondary | ICD-10-CM

## 2021-05-29 NOTE — Progress Notes (Signed)
SITUATION Reason for Visit: Fitting of Diabetic Shoes & Insoles Patient / Caregiver Report:  Patient reports comfort and is satisfied  OBJECTIVE DATA: Patient History / Diagnosis:     ICD-10-CM   1. Type 2 diabetes mellitus with diabetic neuropathy, without long-term current use of insulin (HCC)  E11.40     2. Hammertoe of left foot  M20.42     3. Hammertoe of right foot  M20.41     4. Pes planus of both feet  M21.41    M21.42       Change in Status:   None  ACTIONS PERFORMED: In-Person Delivery, patient was fit with: - 1x pair [id number] PDAC approved prefabricated Diabetic Shoes: Apex [id number] 7M - 3x pair [id number] PDAC approved CAM milled custom diabetic insoles: [employer]  Shoes and insoles were verified for structural integrity and safety. Patient wore shoes and insoles in office. Skin was inspected and free of areas of concern after wearing shoes and inserts. Shoes and inserts fit properly. Patient / Caregiver provided with ferbal instruction and demonstration regarding donning, doffing, wear, care, proper fit, function, purpose, cleaning, and use of shoes and insoles ' and in all related precautions and risks and benefits regarding shoes and insoles. Patient / Caregiver was instructed to wear properly fitting socks with shoes at all times. Patient was also provided with verbal instruction regarding how to report any failures or malfunctions of shoes or inserts, and necessary follow up care. Patient / Caregiver was also instructed to contact physician regarding change in status that may affect function of shoes and inserts.   Patient / Caregiver verbalized undersatnding of instruction provided. Patient / Caregiver demonstrated independence with proper donning and doffing of shoes and inserts.  PLAN Patient to follow up as needed. Plan of care was discussed with and agreed upon by patient and/or caregiver. All questions were answered and concerns addressed.

## 2021-05-30 ENCOUNTER — Other Ambulatory Visit (HOSPITAL_BASED_OUTPATIENT_CLINIC_OR_DEPARTMENT_OTHER): Payer: Self-pay

## 2021-05-30 MED ORDER — CYCLOSPORINE 0.05 % OP EMUL
OPHTHALMIC | 6 refills | Status: DC
  Filled 2021-06-19: qty 60, 30d supply, fill #0
  Filled 2021-07-23: qty 60, 30d supply, fill #1
  Filled 2021-08-15: qty 60, 30d supply, fill #2

## 2021-06-06 ENCOUNTER — Other Ambulatory Visit: Payer: Self-pay

## 2021-06-06 ENCOUNTER — Telehealth: Payer: Self-pay | Admitting: Gastroenterology

## 2021-06-06 ENCOUNTER — Other Ambulatory Visit (HOSPITAL_BASED_OUTPATIENT_CLINIC_OR_DEPARTMENT_OTHER): Payer: Self-pay

## 2021-06-06 MED ORDER — FLUCONAZOLE 150 MG PO TABS
150.0000 mg | ORAL_TABLET | Freq: Every day | ORAL | 0 refills | Status: AC
  Filled 2021-06-06: qty 3, 3d supply, fill #0

## 2021-06-06 NOTE — Telephone Encounter (Signed)
Inbound call from patient stating that the antibiotics that Dr. Rush Landmark gave her has given her a yeast infection and is seeking advice if there is anything he can proscribe anything to help treat it. Please advise.

## 2021-06-06 NOTE — Telephone Encounter (Signed)
Spoke with patient & she is aware that prescription has been sent to pharmacy, and to follow up with PCP if still having signs/symptoms.

## 2021-06-06 NOTE — Telephone Encounter (Signed)
I am sorry to hear about that. Please prescribe her fluconazole 150 mg daily x 3 days. If she is continuing to have signs/symptoms of a yeast infection, she should discuss things further with her PCP. Thanks. GM

## 2021-06-07 ENCOUNTER — Other Ambulatory Visit (HOSPITAL_BASED_OUTPATIENT_CLINIC_OR_DEPARTMENT_OTHER): Payer: Self-pay

## 2021-06-12 ENCOUNTER — Other Ambulatory Visit (HOSPITAL_BASED_OUTPATIENT_CLINIC_OR_DEPARTMENT_OTHER): Payer: Self-pay

## 2021-06-13 ENCOUNTER — Encounter: Payer: Medicare Other | Admitting: Family Medicine

## 2021-06-19 ENCOUNTER — Other Ambulatory Visit (HOSPITAL_BASED_OUTPATIENT_CLINIC_OR_DEPARTMENT_OTHER): Payer: Self-pay

## 2021-06-19 DIAGNOSIS — E1140 Type 2 diabetes mellitus with diabetic neuropathy, unspecified: Secondary | ICD-10-CM | POA: Diagnosis not present

## 2021-06-20 ENCOUNTER — Other Ambulatory Visit (HOSPITAL_BASED_OUTPATIENT_CLINIC_OR_DEPARTMENT_OTHER): Payer: Self-pay

## 2021-06-20 ENCOUNTER — Other Ambulatory Visit: Payer: Self-pay | Admitting: Family Medicine

## 2021-06-20 MED ORDER — METFORMIN HCL ER 750 MG PO TB24
ORAL_TABLET | ORAL | 1 refills | Status: DC
  Filled 2021-06-20: qty 180, 90d supply, fill #0
  Filled 2021-09-20: qty 180, 90d supply, fill #1

## 2021-06-20 MED FILL — Estradiol Vaginal Tab 10 MCG: VAGINAL | 90 days supply | Qty: 45 | Fill #1 | Status: AC

## 2021-06-21 ENCOUNTER — Other Ambulatory Visit (HOSPITAL_BASED_OUTPATIENT_CLINIC_OR_DEPARTMENT_OTHER): Payer: Self-pay

## 2021-06-26 NOTE — Telephone Encounter (Signed)
Encounter reentered, only one needed ?

## 2021-06-27 ENCOUNTER — Other Ambulatory Visit (HOSPITAL_BASED_OUTPATIENT_CLINIC_OR_DEPARTMENT_OTHER): Payer: Self-pay

## 2021-06-27 MED FILL — Estradiol Vaginal Tab 10 MCG: VAGINAL | 6 days supply | Qty: 3 | Fill #2 | Status: AC

## 2021-06-28 ENCOUNTER — Ambulatory Visit (INDEPENDENT_AMBULATORY_CARE_PROVIDER_SITE_OTHER): Payer: Medicare Other | Admitting: Gastroenterology

## 2021-06-28 ENCOUNTER — Other Ambulatory Visit: Payer: Medicare Other

## 2021-06-28 ENCOUNTER — Encounter: Payer: Self-pay | Admitting: Gastroenterology

## 2021-06-28 VITALS — BP 114/70 | HR 80 | Ht 65.0 in | Wt 165.6 lb

## 2021-06-28 DIAGNOSIS — K449 Diaphragmatic hernia without obstruction or gangrene: Secondary | ICD-10-CM | POA: Diagnosis not present

## 2021-06-28 DIAGNOSIS — R1319 Other dysphagia: Secondary | ICD-10-CM

## 2021-06-28 DIAGNOSIS — Z8619 Personal history of other infectious and parasitic diseases: Secondary | ICD-10-CM | POA: Diagnosis not present

## 2021-06-28 NOTE — Patient Instructions (Addendum)
Continue Nexium '40mg'$  once daily through March, then stop for 2 weeks. After being off of Nexium for 2 weeks, then submit stool study for H pylori. After you have turned in stool study you may restart Nexium.  ? ?Your provider has requested that you go to the basement level for lab work before leaving today. Press "B" on the elevator. The lab is located at the first door on the left as you exit the elevator. ? ? ?You have been scheduled for an esophageal manometry test at [facility] on 10/09/21 at 10:30am. Please arrive 30 minutes prior to your procedure for registration. You will need to go to outpatient registration (1st floor of the hospital) first. Make certain to bring your insurance cards as well as a complete list of medications. ? ?Please remember the following: ? ?1) Do not take any muscle relaxants, xanax (alprazolam) or ativan for 1 day prior to your test as well as the day of the test. ? ?2) Nothing to eat or drink after 12:00 midnight on the night before your test. ? ?3) Hold all diabetic medications/insulin the morning of the test. You may eat and take your medications after the test. ? ?It will take at least 2 weeks to receive the results of this test from your physician. ? ?------------------------------------------ ?ABOUT ESOPHAGEAL MANOMETRY ?Esophageal manometry (muh-NOM-uh-tree) is a test that gauges how well your esophagus works. Your esophagus is the long, muscular tube that connects your throat to your stomach. Esophageal manometry measures the rhythmic muscle contractions (peristalsis) that occur in your esophagus when you swallow. Esophageal manometry also measures the coordination and force exerted by the muscles of your esophagus.  ?During esophageal manometry, a thin, flexible tube (catheter) that contains sensors is passed through your nose, down your esophagus and into your stomach. Esophageal manometry can be helpful in diagnosing some mostly uncommon disorders that affect  your esophagus.  ?Why it's done ?Esophageal manometry is used to evaluate the movement (motility) of food through the esophagus and into the stomach. The test measures how well the circular bands of muscle (sphincters) at the top and bottom of your esophagus open and close, as well as the pressure, strength and pattern of the wave of esophageal muscle contractions that moves food along.  ?What you can expect ?Esophageal manometry is an outpatient procedure done without sedation. Most people tolerate it well. You may be asked to change into a hospital gown before the test starts.  ?During esophageal manometry  ?While you are sitting up, a member of your health care team sprays your throat with a numbing medication or puts numbing gel in your nose or both.  ?A catheter is guided through your nose into your esophagus. The catheter may be sheathed in a water-filled sleeve. It doesn't interfere with your breathing. However, your eyes may water, and you may gag. You may have a slight nosebleed from irritation.  ?After the catheter is in place, you may be asked to lie on your back on an exam table, or you may be asked to remain seated.  ?You then swallow small sips of water. As you do, a computer connected to the catheter records the pressure, strength and pattern of your esophageal muscle contractions.  ?During the test, you'll be asked to breathe slowly and smoothly, remain as still as possible, and swallow only when you're asked to do so.  ?A member of your health care team may move the catheter down into your stomach while the catheter continues  its measurements.  ?The catheter then is slowly withdrawn. ?The test usually lasts 20 to 30 minutes.  ?After esophageal manometry  ?When your esophageal manometry is complete, you may return to your normal activities ? ?This test typically takes 30-45 minutes to complete. ?_______________________________________________________________ ? ?Thank you for choosing me and [facility]. ? ?Dr. Rush Landmark ? ?

## 2021-06-28 NOTE — Progress Notes (Signed)
Dysphagia  GASTROENTEROLOGY OUTPATIENT CLINIC VISIT   Primary Care Provider Mosie Lukes, MD [facility] [address] [place] [id number] [phone]  Patient Profile: Victoria Walker is a 71 y.o. female with a pmh significant for Diabetes, hypertension, hyperlipidemia, hypothyroidism, prior breast cancer, sciatica, GERD, hiatal hernia, cervical ring (s/p dilation), colon polyps (TAs), H. pylori infection (confirmed eradication and now with recurrence).  The patient presents to the [facility] for an evaluation and management of problem(s) noted below:  Problem List 1. Esophageal dysphagia   2. Recurrent history of Helicobacter infection   3. Hiatal hernia     History of Present Illness Please see prior notes for full details of HPI.  Interval History The patient returns for a scheduled follow-up after her recent endoscopy.  We performed a endoscopic dilation of her esophagus.  She had a mucosal wrent just below the upper esophageal sphincter.  We also proceeded with disruption of her Schatzki ring.  Patient states that she is doing better from a swallowing standpoint but is still not 100%.  She still has some solid foods as well as pills that can take a while to transit through her chest into her stomach.  She has to ingest fluids to help those past when that sensation occurs.  She denies regurgitation or vomiting of her foods.  The patient's weight has been stable.  The patient was also found to have H. pylori gastritis, once again.  This is quite interesting because we had eradicated it previously and shown that she no longer had it so she did not understand why she could have it occur once again.  She has completed therapy just about a week ago.  She remains on PPI therapy currently solely.  Her bowel habits are normal.  She denies any blood in her stools.  GI Review of Systems Positive as above Negative for pain, bloating, alteration of bowel habits    Review of Systems General: Denies fevers/chills/weight loss unintentionally Cardiovascular: Denies chest pain Pulmonary: Denies shortness of breath Gastroenterological: See HPI Genitourinary: Denies darkened urine Hematological: Denies easy bruising/bleeding Dermatological: Denies jaundice Psychological: Mood is stable   Medications Current Outpatient Medications  Medication Sig Dispense Refill   aspirin 81 MG EC tablet Take 81 mg by mouth every other day. Swallow whole.     atorvastatin (LIPITOR) 40 MG tablet TAKE 1 TABLET BY MOUTH EVERY OTHER DAY, ALTERNATING WITH 1/2 TABLET 180 tablet 1   AVAPRO 75 MG tablet TAKE 1 TABLET (75 MG TOTAL) BY MOUTH DAILY. 90 tablet 1   Calcium Carbonate-Vitamin D (OS-CAL 500 + D PO)      Carboxymethylcellul-Glycerin (REFRESH OPTIVE OP) Apply to eye.     Cholecalciferol (VITAMIN D) 2000 UNITS CAPS Take 1 capsule by mouth every evening.      Ciclopirox 1 % shampoo apply to affected area(s) as directed twice a week 120 mL 11   clindamycin (CLEOCIN) 300 MG capsule Take 2 capsules by mouth 1 hour prior to dental work 6 capsule 0   cycloSPORINE (RESTASIS) 0.05 % ophthalmic emulsion Place 1 drop into both eyes twice daily. 60 each 6   diltiazem (TIAZAC) 360 MG 24 hr capsule TAKE 1 CAPSULE (360 MG TOTAL) BY MOUTH DAILY. 90 capsule 1   Estradiol 10 MCG TABS vaginal tablet INSERT 1 TAB VAGINALLY AT BEDTIME EVERY OTHER DAY 48 tablet 4   gabapentin (NEURONTIN) 300 MG capsule TAKE 1 CAPSULE (300 MG TOTAL) BY MOUTH AT BEDTIME. 90 capsule  1   glucose blood (ONE TOUCH TEST STRIPS) test strip Use as directed twice daily to check blood sugar. DX code E11.9 300 each 1   levocetirizine (XYZAL) 5 MG tablet TAKE 1 TABLET (5 MG TOTAL) BY MOUTH EVERY EVENING. 90 tablet 3   levothyroxine (SYNTHROID) 88 MCG tablet TAKE 1 TABLET BY MOUTH ONCE DAILY **SKIP SUNDAY** 90 tablet 1   loteprednol (LOTEMAX) 0.5 % ophthalmic suspension Place one drop into both eyes four times daily with  flare up. 10 mL 3   metFORMIN (GLUCOPHAGE-XR) 750 MG 24 hr tablet TAKE 1 TABLET (750 MG TOTAL) BY MOUTH IN THE MORNING AND AT BEDTIME. 180 tablet 1   metoprolol succinate (TOPROL-XL) 25 MG 24 hr tablet TAKE 1 TABLET BY MOUTH EVERY MORNING AND EVERY NIGHT AT BEDTIME 180 tablet 1   mometasone (NASONEX) 50 MCG/ACT nasal spray USE ONE SPRAY IN EACH NOSTRIL TWICE DAILY IF NEEDED FOR STUFFY NOSE 51 g 3   NEXIUM 40 MG capsule TAKE 1 CAPSULE BY MOUTH ONCE DAILY 90 capsule 1   Probiotic Product (PROBIOTIC DAILY PO) Take 1 capsule by mouth daily.     spironolactone (ALDACTONE) 25 MG tablet TAKE 1 & 1/2 TABLETS BY MOUTH DAILY 135 tablet 1   No current facility-administered medications for this visit.    Allergies Allergies  Allergen Reactions   Codeine Anxiety    Jittery    Penicillins Itching   Sulfa Antibiotics Itching   Vicodin [Hydrocodone-Acetaminophen] Itching    ITCHING     Histories Past Medical History:  Diagnosis Date   Arthritis    "both knees" (07/02/2015)   Cancer of right breast (HCC)    Claustrophobia    Dysrhythmia    HX OF FAST HEART RATE AND PALPITATIONS - METOPROLOL HAS HELPED   GERD (gastroesophageal reflux disease)    H/O hiatal hernia    H/O iritis    LEFT EYE - STATES HER EYE BECOMES RED AND VERY SENSITIVE TO LIGHT WHEN FLARE UP OF IRITIS   Heart murmur    "benign; 06/2015"   High cholesterol    History of blood transfusion    "when I had my partial hysterectomy"   History of chickenpox 09/26/2014   History of measles    History of MRSA infection    History of shingles    Hypertension    Hypothyroidism    Medicare annual wellness visit, initial 04/07/2015   Multinodular thyroid    PT HAVING TROUBLE SWALLOWING   Neuropathy    FEET   Primary localized osteoarthritis of right knee    Sciatica of right side 04/25/2015   Spinal headache    spinal headache with epidural   Total knee replacement status, right    Type II diabetes mellitus ([place])    ORAL  MEDICATION - NO INSULIN   Wears glasses    Past Surgical History:  Procedure Laterality Date   ABDOMINAL HYSTERECTOMY  1988   ABDOMINOPLASTY  1986   ANTERIOR AND POSTERIOR REPAIR  05/11/2012   Procedure: ANTERIOR (CYSTOCELE) AND POSTERIOR REPAIR (RECTOCELE);  Surgeon: Reece Packer, MD;  Location: [facility];  Service: Urology;;  with graft   BREAST BIOPSY Right    CARDIAC CATHETERIZATION  1999   COLONOSCOPY     CYSTOSCOPY WITH URETHRAL DILATATION  05/11/2012   Procedure: CYSTOSCOPY WITH URETHRAL DILATATION;  Surgeon: Reece Packer, MD;  Location: [facility];  Service: Urology;;   ESOPHAGOGASTRODUODENOSCOPY     HERNIA REPAIR     MASTECTOMY  Right 2007   PLANTAR FASCIA SURGERY Left    THYROIDECTOMY N/A 02/23/2014   Procedure: TOTAL THYROIDECTOMY;  Surgeon: Armandina Gemma, MD;  Location: [facility];  Service: General;  Laterality: N/A;   TOTAL KNEE ARTHROPLASTY Right 07/02/2015   TOTAL KNEE ARTHROPLASTY Right 07/02/2015   Procedure: TOTAL KNEE ARTHROPLASTY;  Surgeon: Elsie Saas, MD;  Location: [facility];  Service: Orthopedics;  Laterality: Right;   TOTAL THYROIDECTOMY  1976   removed three tumor (2 on right; 1 on left)   TUBAL LIGATION     UMBILICAL HERNIA REPAIR  1986   VAGINAL PROLAPSE REPAIR  05/11/2012   Procedure: VAGINAL VAULT SUSPENSION;  Surgeon: Reece Packer, MD;  Location: [facility];  Service: Urology;;   VULVA / PERINEUM BIOPSY     vulvar   Social History   Socioeconomic History   Marital status: Married    Spouse name: Not on file   Number of children: 3   Years of education: Not on file   Highest education level: Not on file  Occupational History    Comment: Retired  Tobacco Use   Smoking status: Never   Smokeless tobacco: Never  Vaping Use   Vaping Use: Never used  Substance and Sexual Activity   Alcohol use: No    Alcohol/week: 0.0 standard drinks   Drug use: No   Sexual activity: Not Currently    Birth control/protection: Surgical    Comment: lives with  husband, retired from various jobs, diabetic diet  Other Topics Concern   Not on file  Social History Narrative   Not on file   Social Determinants of Health   Financial Resource Strain: Low Risk    Difficulty of Paying Living Expenses: Not hard at all  Food Insecurity: No Food Insecurity   Worried About Charity fundraiser in the Last Year: Never true   Arboriculturist in the Last Year: Never true  Transportation Needs: No Transportation Needs   Lack of Transportation (Medical): No   Lack of Transportation (Non-Medical): No  Physical Activity: Insufficiently Active   Days of Exercise per Week: 2 days   Minutes of Exercise per Session: 20 min  Stress: No Stress Concern Present   Feeling of Stress : Not at all  Social Connections: Moderately Integrated   Frequency of Communication with Friends and Family: More than three times a week   Frequency of Social Gatherings with Friends and Family: More than three times a week   Attends Religious Services: More than 4 times per year   Active Member of [employer] or Organizations: No   Attends Music therapist: Never   Marital Status: Married  Human resources officer Violence: Not At Risk   Fear of Current or Ex-Partner: No   Emotionally Abused: No   Physically Abused: No   Sexually Abused: No   Family History  Problem Relation Age of Onset   Hypertension Mother    Heart disease Mother        pacer, CHF   Hyperlipidemia Mother    Kidney disease Mother        1 kidney due to stone   Kidney Stones Mother    Hypertension Father    Diabetes Father    Alcohol abuse Father    Lung cancer Father        lung, psa   Diabetes Sister    Hypertension Sister    Breast cancer Sister    Diabetes Brother    Hypertension  Brother    Hyperlipidemia Brother    Kidney disease Brother        dialysis   Stroke Brother    Hypertension Maternal Grandmother    Heart disease Maternal Grandmother    Stroke Maternal Grandmother    Hypertension  Maternal Grandfather    Hypertension Daughter    Arthritis Son    Hyperlipidemia Sister    Hypertension Sister    Diabetes Sister    Thyroid disease Sister    Kidney disease Sister    Endometriosis Sister    Breast cancer Sister        thyroid and breast   Thyroid cancer Sister    Arthritis Sister        back pain   Hypertension Sister    Thyroid disease Sister    Hypertension Brother    Hyperlipidemia Brother    Hypertension Brother    Hyperlipidemia Brother    Benign prostatic hyperplasia Brother    Hypertension Daughter    Hyperlipidemia Daughter    Diabetes Daughter    Hypertension Maternal Aunt    Hypertension Maternal Uncle    Hypertension Paternal Aunt    Cancer Paternal Aunt    Hypertension Paternal Uncle    Colon cancer Neg Hx    Stomach cancer Neg Hx    Esophageal cancer Neg Hx    Pancreatic cancer Neg Hx    Liver disease Neg Hx    Rectal cancer Neg Hx    Inflammatory bowel disease Neg Hx    I have reviewed her medical, social, and family history in detail and updated the electronic medical record as necessary.    PHYSICAL EXAMINATION  BP 114/70    Pulse 80    Ht '5\' 5"'$  (1.651 m)    Wt 165 lb 9.6 oz (75.1 kg)    BMI 27.56 kg/m  Wt Readings from Last 3 Encounters:  06/28/21 165 lb 9.6 oz (75.1 kg)  05/23/21 171 lb (77.6 kg)  05/08/21 171 lb (77.6 kg)  GEN: NAD, appears stated age, doesn't appear chronically ill PSYCH: Cooperative, without pressured speech EYE: Conjunctivae pink, sclerae anicteric ENT: MMM CV: Nontachycardic RESP: No audible wheezing GI: NABS, soft, rounded, NT/ND, without rebound MSK/EXT: No significant lower extremity edema SKIN: No jaundice NEURO:  Alert & Oriented x 3, no focal deficits   REVIEW OF DATA  I reviewed the following data at the time of this encounter:  GI Procedures and Studies  February 2023 EGD - No gross mucosal lesions in esophagus. Non-obstructing Schatzki ring at GE Jxn - disrupted.  Sav[place] dilation  performed with mild mucosal wrent just below the UES noted. - 2 cm hiatal hernia. - Gastritis. Biopsied. - No gross lesions in the duodenal bulb, in the first portion of the duodenum and in the second portion of the duodenum.  Pathology Diagnosis 1. Surgical [P], gastric antrum and gastric body - ACTIVE CHRONIC GASTRITIS WITH ASSOCIATED HELICOBACTER PYLORI INFECTION (H. PYLORI ORGANISMS IDENTIFIED WITH H. PYLORI IMMUNOHISTOCHEMICAL STAIN, WITH ADEQUATE CONTROLS). - NEGATIVE FOR INTESTINAL METAPLASIA. - NEGATIVE FOR MALIGNANCY. 2. Surgical [P], esophagus (ring) - SQUAMOCOLUMNAR MUCOSA WITH ACTIVE CHRONIC INFLAMMATION OF GASTRIC OXYNTIC-TYPE MUCOSA. - NEGATIVE FOR HELICOBACTER PYLORI ORGANISMS (H. PYLORI IHC STAIN EXAMINED). - NEGATIVE FOR INTESTINAL METAPLASIA. - NEGATIVE FOR MALIGNANCY.  Laboratory Studies  Reviewed those in epic and care everywhere  Imaging Studies  No relevant studies to review   ASSESSMENT  Ms. Carmen is a 71 y.o. female with a pmh significant for Diabetes, hypertension,  hyperlipidemia, hypothyroidism, prior breast cancer, sciatica, GERD, hiatal hernia, cervical ring (s/p dilation), colon polyps (TAs), H. pylori infection (confirmed eradication and now with recurrence).  The patient is seen today for evaluation and management of:  1. Esophageal dysphagia   2. Recurrent history of Helicobacter infection   3. Hiatal hernia    The patient is clinically and hemodynamically stable.  She has had some improvement after esophageal empiric dilation.  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  She is at least 70% better but still has symptoms.  We are going to pursue a manometry to ensure nothing else is being missed.  She will continue to chew her food thoroughly and take small bites and lengthen the time that she is eating.  We will work on scheduling that.  The patient also was found to have recurrent H. pylori gastritis.  She has  completed treatment.  We will keep her on PPI therapy through the month of March and subsequently in April have her come back in for H. pylori stool antigen testing after PPI has been helped.  We will follow her back after the H. pylori stool antigen and the manometry have been completed.  All patient questions were answered to the best of my ability, and the patient agrees to the aforementioned plan of action with follow-up as indicated.   PLAN  Continue PPI once daily through the month of March - Then stop for 2 weeks in the beginning of April - Then give H. pylori stool antigen - Then restart PPI 40 mg daily Proceed with scheduling esophageal manometry Surveillance colonoscopy in 2024   Orders Placed This Encounter  Procedures   Procedural/ Surgical Case Request: ESOPHAGEAL MANOMETRY (EM)    New Prescriptions   No medications on file   Modified Medications   No medications on file    Planned Follow Up No follow-ups on file.   Total Time in Face-to-Face and in Coordination of Care for patient including independent/personal interpretation/review of prior testing, medical history, examination, medication adjustment, communicating results with the patient directly, and documentation with the EHR is 25 minutes.  Justice Britain, MD [facility] Office # [phone]

## 2021-07-03 ENCOUNTER — Other Ambulatory Visit (HOSPITAL_BASED_OUTPATIENT_CLINIC_OR_DEPARTMENT_OTHER): Payer: Self-pay

## 2021-07-03 ENCOUNTER — Encounter: Payer: Self-pay | Admitting: Obstetrics & Gynecology

## 2021-07-03 ENCOUNTER — Other Ambulatory Visit: Payer: Self-pay

## 2021-07-03 ENCOUNTER — Ambulatory Visit (INDEPENDENT_AMBULATORY_CARE_PROVIDER_SITE_OTHER): Payer: Medicare Other | Admitting: Obstetrics & Gynecology

## 2021-07-03 VITALS — BP 116/67 | HR 64 | Ht 65.0 in | Wt 168.0 lb

## 2021-07-03 DIAGNOSIS — M8580 Other specified disorders of bone density and structure, unspecified site: Secondary | ICD-10-CM | POA: Diagnosis not present

## 2021-07-03 DIAGNOSIS — Z853 Personal history of malignant neoplasm of breast: Secondary | ICD-10-CM

## 2021-07-03 DIAGNOSIS — Z01419 Encounter for gynecological examination (general) (routine) without abnormal findings: Secondary | ICD-10-CM | POA: Diagnosis not present

## 2021-07-03 DIAGNOSIS — B3731 Acute candidiasis of vulva and vagina: Secondary | ICD-10-CM

## 2021-07-03 DIAGNOSIS — N952 Postmenopausal atrophic vaginitis: Secondary | ICD-10-CM

## 2021-07-03 DIAGNOSIS — Z780 Asymptomatic menopausal state: Secondary | ICD-10-CM | POA: Diagnosis not present

## 2021-07-03 MED ORDER — FLUCONAZOLE 150 MG PO TABS
150.0000 mg | ORAL_TABLET | Freq: Once | ORAL | 1 refills | Status: AC
  Filled 2021-07-03: qty 2, 2d supply, fill #0
  Filled 2021-08-15: qty 2, 2d supply, fill #1

## 2021-07-03 MED ORDER — ESTRADIOL 10 MCG VA TABS
ORAL_TABLET | VAGINAL | 4 refills | Status: AC
  Filled 2021-07-03: qty 48, fill #0
  Filled 2021-09-20: qty 40, 80d supply, fill #0
  Filled 2022-03-19: qty 40, 80d supply, fill #1
  Filled 2022-06-19: qty 40, 80d supply, fill #2

## 2021-07-03 NOTE — Progress Notes (Signed)
Subjective:  ?  ? Victoria Walker is a 71 y.o. female here for a routine exam.  Current complaints: pt was begin treated for H pylori and took the meds and now has vaginal itching. She took Diflucan prescribed by primary care with only limited relief.   ?  ?Gynecologic History ?No LMP recorded. Patient has had a hysterectomy. ?Contraception: post menopausal status ?Last Pap: s/p hyst.  ?Last mammogram: 11/28/2020. Results were: normal ? ?Obstetric History ?OB History  ?Gravida Para Term Preterm AB Living  ?'3 3 3     3  '$ ?SAB IAB Ectopic Multiple Live Births  ?           ?  ?# Outcome Date GA Lbr Len/2nd Weight Sex Delivery Anes PTL Lv  ?3 Term 24    F Vag-Spont     ?[place]     ? ?The following portions of the patient's history were reviewed and updated as appropriate: allergies, current medications, past family history, past medical history, past social history, past surgical history, and problem list. ? ?Review of Systems ?Pertinent items are noted in HPI.  ?  ?Objective:  ?BP 116/67   Pulse 64   Ht '5\' 5"'$  (1.651 m)   Wt 168 lb (76.2 kg)   BMI 27.96 kg/m?  ?General Appearance:    Alert, cooperative, no distress, appears stated age  ?Head:    Normocephalic, without obvious abnormality, atraumatic  ?Eyes:    conjunctiva/corneas clear, EOM's intact, both eyes  ?Ears:    Normal external ear canals, both ears  ?Nose:   Nares normal, septum midline, mucosa normal, no drainage    or sinus tenderness  ?Throat:   Lips, mucosa, and tongue normal; teeth and gums normal  ?Neck:   Supple, symmetrical, trachea midline, no adenopathy;  ?  thyroid:  no enlargement/tenderness/nodules  ?Back:     Symmetric, no curvature, ROM normal, no CVA tenderness  ?Lungs:     respirations unlabored  ?Chest Wall:    No tenderness or deformity  ? Heart:    Regular rate and rhythm  ?Breast Exam:    No tenderness, masses, or nipple abnormality  ?Abdomen:     Soft, non-tender, bowel sounds  active all four quadrants,  ?  no masses, no organomegaly  ?Genitalia:    Normal female without lesion, discharge or tenderness  ?   ?Extremities:   Extremities normal, atraumatic, no cyanosis or edema  ?Pulses:   2+ and symmetric all extremities  ?Skin:   Skin color, texture, turgor normal, no rashes or lesions  ?  ? ?Assessment:  ? ? Healthy female exam.  ?  ?Plan:  ? ?Diagnoses and all orders for this visit: ? ?Well female exam with routine gynecological exam ? ?History of breast cancer ? ?Yeast vaginitis ?-     fluconazole (DIFLUCAN) 150 MG tablet; Take 1 tablet (150 mg total) by mouth once for 1 dose. Repeat dose in 3 days. ? ?Vaginal atrophy ? ?Osteopenia after menopause ?-     Estradiol 10 MCG TABS vaginal tablet; INSERT 1 TAB VAGINALLY AT BEDTIME EVERY OTHER DAY ? ?Senile atrophic vaginitis ?-     Estradiol 10 MCG TABS vaginal tablet; INSERT 1 TAB VAGINALLY AT BEDTIME EVERY OTHER DAY ? ?  ?F/u in 1 year or sooner prn  ? ?Nakeia Calvi L. Harraway-Smith, M.D., [place] ? ?

## 2021-07-05 NOTE — Progress Notes (Deleted)
? ?Subjective:  ? ? Patient ID: Victoria Walker, female    DOB: 1950/08/24, 71 y.o.   MRN: [id number] ? ?No chief complaint on file. ? ? ?HPI ?Patient is in today for her annual physical exam. ? ?Past Medical History:  ?Diagnosis Date  ? Arthritis   ? "both knees" (07/02/2015)  ? Cancer of right breast ([place])   ? Claustrophobia   ? Dysrhythmia   ? HX OF FAST HEART RATE AND PALPITATIONS - METOPROLOL HAS HELPED  ? GERD (gastroesophageal reflux disease)   ? H/O hiatal hernia   ? H/O iritis   ? LEFT EYE - STATES HER EYE BECOMES RED AND VERY SENSITIVE TO LIGHT WHEN FLARE UP OF IRITIS  ? Heart murmur   ? "benign; 06/2015"  ? High cholesterol   ? History of blood transfusion   ? "when I had my partial hysterectomy"  ? History of chickenpox 09/26/2014  ? History of measles   ? History of MRSA infection   ? History of shingles   ? Hypertension   ? Hypothyroidism   ? Medicare annual wellness visit, initial 04/07/2015  ? Multinodular thyroid   ? PT HAVING TROUBLE SWALLOWING  ? Neuropathy   ? FEET  ? Primary localized osteoarthritis of right knee   ? Sciatica of right side 04/25/2015  ? Spinal headache   ? spinal headache with epidural  ? Total knee replacement status, right   ? Type II diabetes mellitus ([place])   ? ORAL MEDICATION - NO INSULIN  ? Wears glasses   ? ? ?Past Surgical History:  ?Procedure Laterality Date  ? ABDOMINAL HYSTERECTOMY  1988  ? ABDOMINOPLASTY  1986  ? ANTERIOR AND POSTERIOR REPAIR  05/11/2012  ? Procedure: ANTERIOR (CYSTOCELE) AND POSTERIOR REPAIR (RECTOCELE);  Surgeon: Reece Packer, MD;  [facility]: [facility];  Service: Urology;;  with graft  ? BREAST BIOPSY Right   ? CARDIAC CATHETERIZATION  1999  ? COLONOSCOPY    ? CYSTOSCOPY WITH URETHRAL DILATATION  05/11/2012  ? Procedure: CYSTOSCOPY WITH URETHRAL DILATATION;  Surgeon: Reece Packer, MD;  [facility];  Service: Urology;;  ? ESOPHAGOGASTRODUODENOSCOPY    ? HERNIA REPAIR    ? MASTECTOMY Right 2007  ? PLANTAR FASCIA SURGERY Left   ? THYROIDECTOMY  N/A 02/23/2014  ? Procedure: TOTAL THYROIDECTOMY;  Surgeon: Armandina Gemma, MD;  [facility];  Service: General;  Laterality: N/A;  ? TOTAL KNEE ARTHROPLASTY Right 07/02/2015  ? TOTAL KNEE ARTHROPLASTY Right 07/02/2015  ? Procedure: TOTAL KNEE ARTHROPLASTY;  Surgeon: Elsie Saas, MD;  Location: [facility];  Service: Orthopedics;  Laterality: Right;  ? TOTAL THYROIDECTOMY  1976  ? removed three tumor (2 on right; 1 on left)  ? TUBAL LIGATION    ? [place]  ? VAGINAL PROLAPSE REPAIR  05/11/2012  ? Procedure: VAGINAL VAULT SUSPENSION;  Surgeon: Reece Packer, MD;  [facility]: [facility];  Service: Urology;;  ? VULVA / PERINEUM BIOPSY    ? vulvar  ? ? ?Family History  ?Problem Relation Age of Onset  ? Hypertension Mother   ? Heart disease Mother   ?     pacer, CHF  ? Hyperlipidemia Mother   ? Kidney disease Mother   ?     1 kidney due to stone  ? Kidney Stones Mother   ? Hypertension Father   ? Diabetes Father   ? Alcohol abuse Father   ? Lung cancer Father   ?  lung, psa  ? Diabetes Sister   ? Hypertension Sister   ? Breast cancer Sister   ? Diabetes Brother   ? Hypertension Brother   ? Hyperlipidemia Brother   ? Kidney disease Brother   ?     dialysis  ? Stroke Brother   ? Hypertension Maternal Grandmother   ? Heart disease Maternal Grandmother   ? Stroke Maternal Grandmother   ? Hypertension Maternal Grandfather   ? Hypertension Daughter   ? Arthritis Son   ? Hyperlipidemia Sister   ? Hypertension Sister   ? Diabetes Sister   ? Thyroid disease Sister   ? Kidney disease Sister   ? Endometriosis Sister   ? Breast cancer Sister   ?     thyroid and breast  ? Thyroid cancer Sister   ? Arthritis Sister   ?     back pain  ? Hypertension Sister   ? Thyroid disease Sister   ? Hypertension Brother   ? Hyperlipidemia Brother   ? Hypertension Brother   ? Hyperlipidemia Brother   ? Benign prostatic hyperplasia Brother   ? Hypertension Daughter   ? Hyperlipidemia Daughter   ? Diabetes Daughter   ? Hypertension  Maternal Aunt   ? Hypertension Maternal Uncle   ? Hypertension Paternal Aunt   ? Cancer Paternal Aunt   ? Hypertension Paternal Uncle   ? Colon cancer Neg Hx   ? Stomach cancer Neg Hx   ? Esophageal cancer Neg Hx   ? Pancreatic cancer Neg Hx   ? Liver disease Neg Hx   ? Rectal cancer Neg Hx   ? Inflammatory bowel disease Neg Hx   ? ? ?Social History  ? ?Socioeconomic History  ? Marital status: Married  ?  Spouse name: Not on file  ? Number of children: 3  ? Years of education: Not on file  ? Highest education level: Not on file  ?Occupational History  ?  Comment: Retired  ?Tobacco Use  ? Smoking status: Never  ? Smokeless tobacco: Never  ?Vaping Use  ? Vaping Use: Never used  ?Substance and Sexual Activity  ? Alcohol use: No  ?  Alcohol/week: 0.0 standard drinks  ? Drug use: No  ? Sexual activity: Not Currently  ?  Birth control/protection: Surgical  ?  Comment: lives with husband, retired from various jobs, diabetic diet  ?Other Topics Concern  ? Not on file  ?Social History Narrative  ? Not on file  ? ?Social Determinants of Health  ? ?Financial Resource Strain: Low Risk   ? Difficulty of Paying Living Expenses: Not hard at all  ?Food Insecurity: No Food Insecurity  ? Worried About Charity fundraiser in the Last Year: Never true  ? Ran Out of Food in the Last Year: Never true  ?Transportation Needs: No Transportation Needs  ? Lack of Transportation (Medical): No  ? Lack of Transportation (Non-Medical): No  ?Physical Activity: Insufficiently Active  ? Days of Exercise per Week: 2 days  ? Minutes of Exercise per Session: 20 min  ?Stress: No Stress Concern Present  ? Feeling of Stress : Not at all  ?Social Connections: Moderately Integrated  ? Frequency of Communication with Friends and Family: More than three times a week  ? Frequency of Social Gatherings with Friends and Family: More than three times a week  ? Attends Religious Services: More than 4 times per year  ? Active Member of Clubs or Organizations: No  ?  Attends  Club or Organization Meetings: Never  ? Marital Status: Married  ?Intimate Partner Violence: Not At Risk  ? Fear of Current or Ex-Partner: No  ? Emotionally Abused: No  ? Physically Abused: No  ? Sexually Abused: No  ? ? ?Outpatient Medications Prior to Visit  ?Medication Sig Dispense Refill  ? aspirin 81 MG EC tablet Take 81 mg by mouth every other day. Swallow whole.    ? atorvastatin (LIPITOR) 40 MG tablet TAKE 1 TABLET BY MOUTH EVERY OTHER DAY, ALTERNATING WITH 1/2 TABLET 180 tablet 1  ? AVAPRO 75 MG tablet TAKE 1 TABLET (75 MG TOTAL) BY MOUTH DAILY. 90 tablet 1  ? Calcium Carbonate-Vitamin D (OS-CAL 500 + D PO)     ? Carboxymethylcellul-Glycerin (REFRESH OPTIVE OP) Apply to eye.    ? Cholecalciferol (VITAMIN D) 2000 UNITS CAPS Take 1 capsule by mouth every evening.     ? Ciclopirox 1 % shampoo apply to affected area(s) as directed twice a week 120 mL 11  ? clindamycin (CLEOCIN) 300 MG capsule Take 2 capsules by mouth 1 hour prior to dental work (Patient taking differently: Pt takes this medication for PRN for dental procedures) 6 capsule 0  ? cycloSPORINE (RESTASIS) 0.05 % ophthalmic emulsion Place 1 drop into both eyes twice daily. 60 each 6  ? diltiazem (TIAZAC) 360 MG 24 hr capsule TAKE 1 CAPSULE (360 MG TOTAL) BY MOUTH DAILY. 90 capsule 1  ? Estradiol 10 MCG TABS vaginal tablet INSERT 1 TAB VAGINALLY AT BEDTIME EVERY OTHER DAY 48 tablet 4  ? fluconazole (DIFLUCAN) 150 MG tablet Take 1 tablet (150 mg total) by mouth once for 1 dose. Repeat dose in 3 days. 2 tablet 1  ? gabapentin (NEURONTIN) 300 MG capsule TAKE 1 CAPSULE (300 MG TOTAL) BY MOUTH AT BEDTIME. 90 capsule 1  ? glucose blood (ONE TOUCH TEST STRIPS) test strip Use as directed twice daily to check blood sugar. DX code E11.9 300 each 1  ? levocetirizine (XYZAL) 5 MG tablet TAKE 1 TABLET (5 MG TOTAL) BY MOUTH EVERY EVENING. 90 tablet 3  ? levothyroxine (SYNTHROID) 88 MCG tablet TAKE 1 TABLET BY MOUTH ONCE DAILY **SKIP SUNDAY** 90 tablet 1  ?  loteprednol (LOTEMAX) 0.5 % ophthalmic suspension Place one drop into both eyes four times daily with flare up. 10 mL 3  ? metFORMIN (GLUCOPHAGE-XR) 750 MG 24 hr tablet TAKE 1 TABLET (750 MG TOTAL) BY MOU

## 2021-07-08 ENCOUNTER — Ambulatory Visit (INDEPENDENT_AMBULATORY_CARE_PROVIDER_SITE_OTHER): Payer: Medicare Other | Admitting: Family Medicine

## 2021-07-08 ENCOUNTER — Encounter: Payer: Self-pay | Admitting: Family Medicine

## 2021-07-08 VITALS — BP 110/70 | HR 76 | Resp 20 | Ht 65.0 in | Wt 164.2 lb

## 2021-07-08 DIAGNOSIS — E2839 Other primary ovarian failure: Secondary | ICD-10-CM | POA: Diagnosis not present

## 2021-07-08 DIAGNOSIS — E1140 Type 2 diabetes mellitus with diabetic neuropathy, unspecified: Secondary | ICD-10-CM | POA: Diagnosis not present

## 2021-07-08 DIAGNOSIS — M8580 Other specified disorders of bone density and structure, unspecified site: Secondary | ICD-10-CM | POA: Diagnosis not present

## 2021-07-08 DIAGNOSIS — N951 Menopausal and female climacteric states: Secondary | ICD-10-CM

## 2021-07-08 DIAGNOSIS — Z8619 Personal history of other infectious and parasitic diseases: Secondary | ICD-10-CM

## 2021-07-08 DIAGNOSIS — R1319 Other dysphagia: Secondary | ICD-10-CM

## 2021-07-08 DIAGNOSIS — E782 Mixed hyperlipidemia: Secondary | ICD-10-CM

## 2021-07-08 DIAGNOSIS — Z0000 Encounter for general adult medical examination without abnormal findings: Secondary | ICD-10-CM | POA: Diagnosis not present

## 2021-07-08 DIAGNOSIS — E039 Hypothyroidism, unspecified: Secondary | ICD-10-CM

## 2021-07-08 DIAGNOSIS — I10 Essential (primary) hypertension: Secondary | ICD-10-CM

## 2021-07-08 DIAGNOSIS — Z780 Asymptomatic menopausal state: Secondary | ICD-10-CM | POA: Diagnosis not present

## 2021-07-08 LAB — COMPREHENSIVE METABOLIC PANEL
ALT: 18 U/L (ref 0–35)
AST: 16 U/L (ref 0–37)
Albumin: 4.4 g/dL (ref 3.5–5.2)
Alkaline Phosphatase: 53 U/L (ref 39–117)
BUN: 13 mg/dL (ref 6–23)
CO2: 32 mEq/L (ref 19–32)
Calcium: 9.8 mg/dL (ref 8.4–10.5)
Chloride: 102 mEq/L (ref 96–112)
Creatinine, Ser: 0.89 mg/dL (ref 0.40–1.20)
GFR: 65.13 mL/min (ref >60.00)
Glucose, Bld: 97 mg/dL (ref 70–99)
Potassium: 4.8 mEq/L (ref 3.5–5.1)
Sodium: 141 mEq/L (ref 135–145)
Total Bilirubin: 0.6 mg/dL (ref 0.2–1.2)
Total Protein: 6.6 g/dL (ref 6.0–8.3)

## 2021-07-08 LAB — TSH: TSH: 0.32 u[IU]/mL — ABNORMAL LOW (ref 0.35–5.50)

## 2021-07-08 LAB — CBC WITH DIFFERENTIAL/PLATELET
Basophils Absolute: 0 10*3/uL (ref 0.0–0.1)
Basophils Relative: 0.3 % (ref 0.0–3.0)
Eosinophils Absolute: 0.1 10*3/uL (ref 0.0–0.7)
Eosinophils Relative: 1 % (ref 0.0–5.0)
HCT: 40.1 % (ref 36.0–46.0)
Hemoglobin: 13.2 g/dL (ref 12.0–15.0)
Lymphocytes Relative: 39.4 % (ref 12.0–46.0)
Lymphs Abs: 2.3 10*3/uL (ref 0.7–4.0)
MCHC: 32.9 g/dL (ref 30.0–36.0)
MCV: 98 fl (ref 78.0–100.0)
Monocytes Absolute: 0.5 10*3/uL (ref 0.1–1.0)
Monocytes Relative: 8.8 % (ref 3.0–12.0)
Neutro Abs: 3 10*3/uL (ref 1.4–7.7)
Neutrophils Relative %: 50.5 % (ref 43.0–77.0)
Platelets: 281 10*3/uL (ref 150.0–400.0)
RBC: 4.09 Mil/uL (ref 3.87–5.11)
RDW: 13.8 % (ref 11.5–15.5)
WBC: 5.9 10*3/uL (ref 4.0–10.5)

## 2021-07-08 LAB — LIPID PANEL
Cholesterol: 169 mg/dL (ref 0–200)
HDL: 54.8 mg/dL (ref >39.00)
LDL Cholesterol: 91 mg/dL (ref 0–99)
NonHDL: 114.68
Total CHOL/HDL Ratio: 3
Triglycerides: 120 mg/dL (ref 0.0–149.0)
VLDL: 24 mg/dL (ref 0.0–40.0)

## 2021-07-08 LAB — HEMOGLOBIN A1C: Hgb A1c MFr Bld: 6.6 % — ABNORMAL HIGH (ref 4.6–6.5)

## 2021-07-08 NOTE — Assessment & Plan Note (Signed)
Well controlled, no changes to meds. Encouraged heart healthy diet such as the DASH diet and exercise as tolerated.  °

## 2021-07-08 NOTE — Progress Notes (Signed)
? ?Subjective:  ? ?By signing my name below, I, Zite Okoli, attest that this documentation has been prepared under the direction and in the presence of Mosie Lukes, MD. 07/08/2021  ?  ? ? Patient ID: Victoria Walker, female    DOB: [birth year], 71 y.o.   MRN: [id number] ? ?Chief Complaint  ?Patient presents with  ? Annual Exam  ? ? ?HPI ?Patient is in today for a comprehensive physical exam. No recent ED visits. ? ?She checks her sugar levels at home and they have been good. This morning, it was 115. They have never been above 200. It was at 43 one time when she had not eaten.  ? ?She has been under a lot of stress with taking her husband to health appointments. ? ?She was recently treated for H.pylori. She has no current symptoms at this time. She is following up with GI and has a manometry in June. Denies constipation and bowel problems.  ? ?She plans on getting a left knee arthroplasty this summer. She had received an injection but it lasted for only  2-3 months. There is a previous history of right knee arthroplasty. ? ?She is trying to manage a healthy diet.  ? ?She denies fever, congestion, eye pain, chest pain, palpitations, leg swelling, shortness of breath, nausea, abdominal pain, diarrhea and blood in stool. Also denies dysuria, frequency, back pain and headaches.  ? ?No recent changes in family medical history.  ? ?She has 5 Covid-19 vaccines at this time. She is UTD on pneumonia, shingles and tetanus vaccines.  ? ?Past Medical History:  ?Diagnosis Date  ? Arthritis   ? "both knees" (07/02/2015)  ? Cancer of right breast ([place])   ? Claustrophobia   ? Dysrhythmia   ? HX OF FAST HEART RATE AND PALPITATIONS - METOPROLOL HAS HELPED  ? GERD (gastroesophageal reflux disease)   ? H/O hiatal hernia   ? H/O iritis   ? LEFT EYE - STATES HER EYE BECOMES RED AND VERY SENSITIVE TO LIGHT WHEN FLARE UP OF IRITIS  ? Heart murmur   ? "benign; 06/2015"  ? High cholesterol   ? History of blood transfusion   ? "when I had  my partial hysterectomy"  ? History of chickenpox 09/26/2014  ? History of measles   ? History of MRSA infection   ? History of shingles   ? Hypertension   ? Hypothyroidism   ? Medicare annual wellness visit, initial 04/07/2015  ? Multinodular thyroid   ? PT HAVING TROUBLE SWALLOWING  ? Neuropathy   ? FEET  ? Primary localized osteoarthritis of right knee   ? Sciatica of right side 04/25/2015  ? Spinal headache   ? spinal headache with epidural  ? Total knee replacement status, right   ? Type II diabetes mellitus ([place])   ? ORAL MEDICATION - NO INSULIN  ? Wears glasses   ? ? ?Past Surgical History:  ?Procedure Laterality Date  ? ABDOMINAL HYSTERECTOMY  1988  ? ABDOMINOPLASTY  1986  ? ANTERIOR AND POSTERIOR REPAIR  05/11/2012  ? Procedure: ANTERIOR (CYSTOCELE) AND POSTERIOR REPAIR (RECTOCELE);  Surgeon: Reece Packer, MD;  [facility];  Service: Urology;;  with graft  ? BREAST BIOPSY Right   ? CARDIAC CATHETERIZATION  1999  ? COLONOSCOPY    ? CYSTOSCOPY WITH URETHRAL DILATATION  05/11/2012  ? Procedure: CYSTOSCOPY WITH URETHRAL DILATATION;  Surgeon: Reece Packer, MD;  [facility];  Service: Urology;;  ? ESOPHAGOGASTRODUODENOSCOPY    ?  HERNIA REPAIR    ? MASTECTOMY Right 2007  ? PLANTAR FASCIA SURGERY Left   ? THYROIDECTOMY N/A 02/23/2014  ? Procedure: TOTAL THYROIDECTOMY;  Surgeon: Armandina Gemma, MD;  [facility];  Service: General;  Laterality: N/A;  ? TOTAL KNEE ARTHROPLASTY Right 07/02/2015  ? TOTAL KNEE ARTHROPLASTY Right 07/02/2015  ? Procedure: TOTAL KNEE ARTHROPLASTY;  Surgeon: Elsie Saas, MD;  Location: [facility];  Service: Orthopedics;  Laterality: Right;  ? TOTAL THYROIDECTOMY  1976  ? removed three tumor (2 on right; 1 on left)  ? TUBAL LIGATION    ? [place]  ? VAGINAL PROLAPSE REPAIR  05/11/2012  ? Procedure: VAGINAL VAULT SUSPENSION;  Surgeon: Reece Packer, MD;  [facility];  Service: Urology;;  ? VULVA / PERINEUM BIOPSY    ? vulvar  ? ? ?Family History   ?Problem Relation Age of Onset  ? Hypertension Mother   ? Heart disease Mother   ?     pacer, CHF  ? Hyperlipidemia Mother   ? Kidney disease Mother   ?     1 kidney due to stone  ? Kidney Stones Mother   ? Hypertension Father   ? Diabetes Father   ? Alcohol abuse Father   ? Lung cancer Father   ?     lung, psa  ? Diabetes Sister   ? Hypertension Sister   ? Breast cancer Sister   ? Diabetes Brother   ? Hypertension Brother   ? Hyperlipidemia Brother   ? Kidney disease Brother   ?     dialysis  ? Stroke Brother   ? Hypertension Maternal Grandmother   ? Heart disease Maternal Grandmother   ? Stroke Maternal Grandmother   ? Hypertension Maternal Grandfather   ? Hypertension Daughter   ? Arthritis Son   ? Hyperlipidemia Sister   ? Hypertension Sister   ? Diabetes Sister   ? Thyroid disease Sister   ? Kidney disease Sister   ? Endometriosis Sister   ? Breast cancer Sister   ?     thyroid and breast  ? Thyroid cancer Sister   ? Arthritis Sister   ?     back pain  ? Hypertension Sister   ? Thyroid disease Sister   ? Hypertension Brother   ? Hyperlipidemia Brother   ? Hypertension Brother   ? Hyperlipidemia Brother   ? Benign prostatic hyperplasia Brother   ? Hypertension Daughter   ? Hyperlipidemia Daughter   ? Diabetes Daughter   ? Hypertension Maternal Aunt   ? Hypertension Maternal Uncle   ? Hypertension Paternal Aunt   ? Cancer Paternal Aunt   ? Hypertension Paternal Uncle   ? Colon cancer Neg Hx   ? Stomach cancer Neg Hx   ? Esophageal cancer Neg Hx   ? Pancreatic cancer Neg Hx   ? Liver disease Neg Hx   ? Rectal cancer Neg Hx   ? Inflammatory bowel disease Neg Hx   ? ? ?Social History  ? ?Socioeconomic History  ? Marital status: Married  ?  Spouse name: Not on file  ? Number of children: 3  ? Years of education: Not on file  ? Highest education level: Not on file  ?Occupational History  ?  Comment: Retired  ?Tobacco Use  ? Smoking status: Never  ? Smokeless tobacco: Never  ?Vaping Use  ? Vaping Use: Never used   ?Substance and Sexual Activity  ? Alcohol use: No  ?  Alcohol/week: 0.0 standard drinks  ? Drug use: No  ? Sexual activity: Not Currently  ?  Birth control/protection: Surgical  ?  Comment: lives with husband, retired from various jobs, diabetic diet  ?Other Topics Concern  ? Not on file  ?Social History Narrative  ? Not on file  ? ?Social Determinants of Health  ? ?Financial Resource Strain: Low Risk   ? Difficulty of Paying Living Expenses: Not hard at all  ?Food Insecurity: No Food Insecurity  ? Worried About Charity fundraiser in the Last Year: Never true  ? Ran Out of Food in the Last Year: Never true  ?Transportation Needs: No Transportation Needs  ? Lack of Transportation (Medical): No  ? Lack of Transportation (Non-Medical): No  ?Physical Activity: Insufficiently Active  ? Days of Exercise per Week: 2 days  ? Minutes of Exercise per Session: 20 min  ?Stress: No Stress Concern Present  ? Feeling of Stress : Not at all  ?Social Connections: Moderately Integrated  ? Frequency of Communication with Friends and Family: More than three times a week  ? Frequency of Social Gatherings with Friends and Family: More than three times a week  ? Attends Religious Services: More than 4 times per year  ? Active Member of Clubs or Organizations: No  ? Attends Archivist Meetings: Never  ? Marital Status: Married  ?Intimate Partner Violence: Not At Risk  ? Fear of Current or Ex-Partner: No  ? Emotionally Abused: No  ? Physically Abused: No  ? Sexually Abused: No  ? ? ?Outpatient Medications Prior to Visit  ?Medication Sig Dispense Refill  ? aspirin 81 MG EC tablet Take 81 mg by mouth every other day. Swallow whole.    ? atorvastatin (LIPITOR) 40 MG tablet TAKE 1 TABLET BY MOUTH EVERY OTHER DAY, ALTERNATING WITH 1/2 TABLET 180 tablet 1  ? AVAPRO 75 MG tablet TAKE 1 TABLET (75 MG TOTAL) BY MOUTH DAILY. 90 tablet 1  ? Calcium Carbonate-Vitamin D (OS-CAL 500 + D PO)     ? Carboxymethylcellul-Glycerin (REFRESH OPTIVE  OP) Apply to eye.    ? Cholecalciferol (VITAMIN D) 2000 UNITS CAPS Take 1 capsule by mouth every evening.     ? Ciclopirox 1 % shampoo apply to affected area(s) as directed twice a week 120 mL 11  ? clindamycin (

## 2021-07-08 NOTE — Assessment & Plan Note (Signed)
Has had her esophagus stretched and is awaiting Manometry is scheduled for June.  ?

## 2021-07-08 NOTE — Assessment & Plan Note (Signed)
On Levothyroxine, continue to monitor 

## 2021-07-08 NOTE — Assessment & Plan Note (Addendum)
Patient encouraged to maintain heart healthy diet, regular exercise, adequate sleep. Consider daily probiotics. Take medications as prescribed. Labs ordered and reviewed. Follows with OB/GYN for paps, MGM due in August 2023, colonoscopy was 2021 and she is being managed by gastroenterology now. Dexa scan ordered today ?

## 2021-07-08 NOTE — Assessment & Plan Note (Signed)
Is following with OB/GYN and doing well ?

## 2021-07-08 NOTE — Assessment & Plan Note (Signed)
hgba1c acceptable, minimize simple carbs. Increase exercise as tolerated. Continue current meds 

## 2021-07-08 NOTE — Assessment & Plan Note (Signed)
She is greatly improved since treatment but still has some symptoms and had to her have her esophagus stretched. Her gastroenterologist has her set up for Manometry in June.  ?

## 2021-07-08 NOTE — Assessment & Plan Note (Signed)
Bone density shows osteopenia, which is thinner than normal but not as bad as osteoporosis. Recommend calcium intake of 1200 to 1500 mg daily, divided into roughly 3 doses. Best source is the diet and a single dairy serving is about 500 mg, a supplement of calcium citrate once or twice daily to balance diet is fine if not getting enough in diet. Also need Vitamin D 2000 IU caps, 1 cap daily if not already taking vitamin D. Also recommend weight baring exercise on hips and upper body to keep bones strong. Repeat Dexa scan ordered 

## 2021-07-08 NOTE — Patient Instructions (Signed)
Preventive Care 34 Years and Older, Female ?Preventive care refers to lifestyle choices and visits with your health care provider that can promote health and wellness. Preventive care visits are also called wellness exams. ?What can I expect for my preventive care visit? ?Counseling ?Your health care provider may ask you questions about your: ?Medical history, including: ?Past medical problems. ?Family medical history. ?Pregnancy and menstrual history. ?History of falls. ?Current health, including: ?Memory and ability to understand (cognition). ?Emotional well-being. ?Home life and relationship well-being. ?Sexual activity and sexual health. ?Lifestyle, including: ?Alcohol, nicotine or tobacco, and drug use. ?Access to firearms. ?Diet, exercise, and sleep habits. ?Work and work Statistician. ?Sunscreen use. ?Safety issues such as seatbelt and bike helmet use. ?Physical exam ?Your health care provider will check your: ?Height and weight. These may be used to calculate your BMI (body mass index). BMI is a measurement that tells if you are at a healthy weight. ?Waist circumference. This measures the distance around your waistline. This measurement also tells if you are at a healthy weight and may help predict your risk of certain diseases, such as type 2 diabetes and high blood pressure. ?Heart rate and blood pressure. ?Body temperature. ?Skin for abnormal spots. ?What immunizations do I need? ?Vaccines are usually given at various ages, according to a schedule. Your health care provider will recommend vaccines for you based on your age, medical history, and lifestyle or other factors, such as travel or where you work. ?What tests do I need? ?Screening ?Your health care provider may recommend screening tests for certain conditions. This may include: ?Lipid and cholesterol levels. ?Hepatitis C test. ?Hepatitis B test. ?HIV (human immunodeficiency virus) test. ?STI (sexually transmitted infection) testing, if you are at  risk. ?Lung cancer screening. ?Colorectal cancer screening. ?Diabetes screening. This is done by checking your blood sugar (glucose) after you have not eaten for a while (fasting). ?Mammogram. Talk with your health care provider about how often you should have regular mammograms. ?BRCA-related cancer screening. This may be done if you have a family history of breast, ovarian, tubal, or peritoneal cancers. ?Bone density scan. This is done to screen for osteoporosis. ?Talk with your health care provider about your test results, treatment options, and if necessary, the need for more tests. ?Follow these instructions at home: ?Eating and drinking ? ?Eat a diet that includes fresh fruits and vegetables, whole grains, lean protein, and low-fat dairy products. Limit your intake of foods with high amounts of sugar, saturated fats, and salt. ?Take vitamin and mineral supplements as recommended by your health care provider. ?Do not drink alcohol if your health care provider tells you not to drink. ?If you drink alcohol: ?Limit how much you have to 0-1 drink a day. ?Know how much alcohol is in your drink. In the [place] one drink equals one 12 oz bottle of beer (355 mL), one 5 oz glass of wine (148 mL), or one 1? oz glass of hard liquor (44 mL). ?Lifestyle ?Brush your teeth every morning and night with fluoride toothpaste. Floss one time each day. ?Exercise for at least 30 minutes 5 or more days each week. ?Do not use any products that contain nicotine or tobacco. These products include cigarettes, chewing tobacco, and vaping devices, such as e-cigarettes. If you need help quitting, ask your health care provider. ?Do not use drugs. ?If you are sexually active, practice safe sex. Use a condom or other form of protection in order to prevent STIs. ?Take aspirin only as told by your  health care provider. Make sure that you understand how much to take and what form to take. Work with your health care provider to find out whether it  is safe and beneficial for you to take aspirin daily. ?Ask your health care provider if you need to take a cholesterol-lowering medicine (statin). ?Find healthy ways to manage stress, such as: ?Meditation, yoga, or listening to music. ?Journaling. ?Talking to a trusted person. ?Spending time with friends and family. ?Minimize exposure to UV radiation to reduce your risk of skin cancer. ?Safety ?Always wear your seat belt while driving or riding in a vehicle. ?Do not drive: ?If you have been drinking alcohol. Do not ride with someone who has been drinking. ?When you are tired or distracted. ?While texting. ?If you have been using any mind-altering substances or drugs. ?Wear a helmet and other protective equipment during sports activities. ?If you have firearms in your house, make sure you follow all gun safety procedures. ?What's next? ?Visit your health care provider once a year for an annual wellness visit. ?Ask your health care provider how often you should have your eyes and teeth checked. ?Stay up to date on all vaccines. ?This information is not intended to replace advice given to you by your health care provider. Make sure you discuss any questions you have with your health care provider. ?Document Revised: 10/03/2020 Document Reviewed: 10/03/2020 ?Elsevier Patient Education ? [place]. ? ?

## 2021-07-08 NOTE — Assessment & Plan Note (Signed)
Tolerating statin, encouraged heart healthy diet, avoid trans fats, minimize simple carbs and saturated fats. Increase exercise as tolerated 

## 2021-07-11 ENCOUNTER — Other Ambulatory Visit: Payer: Self-pay

## 2021-07-11 ENCOUNTER — Ambulatory Visit (HOSPITAL_BASED_OUTPATIENT_CLINIC_OR_DEPARTMENT_OTHER)
Admission: RE | Admit: 2021-07-11 | Discharge: 2021-07-11 | Disposition: A | Discharge status: Home / Self Care | Payer: Medicare Other | Source: Ambulatory Visit | Attending: Family Medicine | Admitting: Family Medicine

## 2021-07-11 DIAGNOSIS — M810 Age-related osteoporosis without current pathological fracture: Secondary | ICD-10-CM | POA: Diagnosis not present

## 2021-07-11 DIAGNOSIS — Z780 Asymptomatic menopausal state: Secondary | ICD-10-CM | POA: Diagnosis not present

## 2021-07-11 DIAGNOSIS — E2839 Other primary ovarian failure: Secondary | ICD-10-CM | POA: Diagnosis present

## 2021-07-11 DIAGNOSIS — M8588 Other specified disorders of bone density and structure, other site: Secondary | ICD-10-CM | POA: Diagnosis not present

## 2021-07-16 ENCOUNTER — Other Ambulatory Visit: Payer: Self-pay

## 2021-07-16 ENCOUNTER — Other Ambulatory Visit (HOSPITAL_BASED_OUTPATIENT_CLINIC_OR_DEPARTMENT_OTHER): Payer: Self-pay

## 2021-07-16 MED ORDER — ALENDRONATE SODIUM 70 MG PO TABS
70.0000 mg | ORAL_TABLET | ORAL | 5 refills | Status: DC
  Filled 2021-07-16: qty 4, 28d supply, fill #0
  Filled 2021-08-15: qty 4, 28d supply, fill #1

## 2021-07-22 DIAGNOSIS — C50911 Malignant neoplasm of unspecified site of right female breast: Secondary | ICD-10-CM | POA: Diagnosis not present

## 2021-07-23 ENCOUNTER — Other Ambulatory Visit (HOSPITAL_BASED_OUTPATIENT_CLINIC_OR_DEPARTMENT_OTHER): Payer: Self-pay

## 2021-07-24 ENCOUNTER — Encounter: Payer: Self-pay | Admitting: Obstetrics & Gynecology

## 2021-07-30 ENCOUNTER — Emergency Department (HOSPITAL_BASED_OUTPATIENT_CLINIC_OR_DEPARTMENT_OTHER): Payer: Medicare Other

## 2021-07-30 ENCOUNTER — Encounter (HOSPITAL_BASED_OUTPATIENT_CLINIC_OR_DEPARTMENT_OTHER): Payer: Self-pay

## 2021-07-30 ENCOUNTER — Emergency Department (HOSPITAL_BASED_OUTPATIENT_CLINIC_OR_DEPARTMENT_OTHER)
Admission: EM | Admit: 2021-07-30 | Discharge: 2021-07-30 | Disposition: A | Discharge status: Home / Self Care | Payer: Medicare Other | Attending: Emergency Medicine | Admitting: Emergency Medicine

## 2021-07-30 ENCOUNTER — Other Ambulatory Visit: Payer: Self-pay

## 2021-07-30 DIAGNOSIS — Y9301 Activity, walking, marching and hiking: Secondary | ICD-10-CM | POA: Diagnosis not present

## 2021-07-30 DIAGNOSIS — M25532 Pain in left wrist: Secondary | ICD-10-CM | POA: Diagnosis not present

## 2021-07-30 DIAGNOSIS — M25561 Pain in right knee: Secondary | ICD-10-CM | POA: Diagnosis not present

## 2021-07-30 DIAGNOSIS — Z7982 Long term (current) use of aspirin: Secondary | ICD-10-CM | POA: Diagnosis not present

## 2021-07-30 DIAGNOSIS — L905 Scar conditions and fibrosis of skin: Secondary | ICD-10-CM | POA: Insufficient documentation

## 2021-07-30 DIAGNOSIS — W010XXA Fall on same level from slipping, tripping and stumbling without subsequent striking against object, initial encounter: Secondary | ICD-10-CM | POA: Insufficient documentation

## 2021-07-30 DIAGNOSIS — Y92137 Garden or yard on military base as the place of occurrence of the external cause: Secondary | ICD-10-CM | POA: Diagnosis not present

## 2021-07-30 DIAGNOSIS — Z96651 Presence of right artificial knee joint: Secondary | ICD-10-CM | POA: Diagnosis not present

## 2021-07-30 DIAGNOSIS — Z7984 Long term (current) use of oral hypoglycemic drugs: Secondary | ICD-10-CM | POA: Insufficient documentation

## 2021-07-30 DIAGNOSIS — S83005A Unspecified dislocation of left patella, initial encounter: Secondary | ICD-10-CM | POA: Diagnosis not present

## 2021-07-30 DIAGNOSIS — E119 Type 2 diabetes mellitus without complications: Secondary | ICD-10-CM | POA: Diagnosis not present

## 2021-07-30 MED ORDER — ACETAMINOPHEN 325 MG PO TABS
650.0000 mg | ORAL_TABLET | Freq: Once | ORAL | Status: AC
  Administered 2021-07-30: 650 mg via ORAL
  Filled 2021-07-30: qty 2

## 2021-07-30 NOTE — ED Triage Notes (Signed)
States she tripped over wood in the yard, left wrist pain/swelling. Also c/o right knee pain, hx knee replacement. ?

## 2021-07-30 NOTE — Discharge Instructions (Signed)
Use the wrist brace throughout the day.  Take Tylenol arthritis for pain.  Follow-up with your primary in the next few weeks if the pain persist.  Please ice over the next 3 days. ?

## 2021-07-30 NOTE — ED Provider Notes (Signed)
?[facility] [place] EMERGENCY DEPARTMENT ?Provider Note ? ? ?CSN: [id number] ?Arrival date & time: 07/30/21  1302 ? ?  ? ?History ? ?Chief Complaint  ?Patient presents with  ? Fall  ? ? ?Victoria Walker is a 71 y.o. female. ? ? ?Fall ? ? ?Patient with medical history of osteoarthritis, right knee replacement, diabetes presents today due to mechanical fall.  Patient states she was walking in front of the yard, she tripped over a piece of wood and landed on her left wrist.  She did not hit her head or lose consciousness, did not have any prodromal symptoms such as chest pain or shortness of breath or dizziness.  States she has pain to her left wrist whenever she moves and she is worried about the right knee as she had a replacement a few years ago.  She is taking Tylenol arthritis which helped with the pain somewhat. ? ?Home Medications ?Prior to Admission medications   ?Medication Sig Start Date End Date Taking? Authorizing Provider  ?alendronate (FOSAMAX) 70 MG tablet Take 1 tablet (70 mg total) by mouth once a week. Take with a full glass of water on an empty stomach. 07/16/21   Mosie Lukes, MD  ?aspirin 81 MG EC tablet Take 81 mg by mouth every other day. Swallow whole.    [provider]  ?atorvastatin (LIPITOR) 40 MG tablet TAKE 1 TABLET BY MOUTH EVERY OTHER DAY, ALTERNATING WITH 1/2 TABLET 03/22/21 03/22/22  Mosie Lukes, MD  ?AVAPRO 75 MG tablet TAKE 1 TABLET (75 MG TOTAL) BY MOUTH DAILY. 05/22/21   Mosie Lukes, MD  ?Calcium Carbonate-Vitamin D (OS-CAL 500 + D PO)     [provider]  ?Carboxymethylcellul-Glycerin (REFRESH OPTIVE OP) Apply to eye.    [provider]  ?Cholecalciferol (VITAMIN D) 2000 UNITS CAPS Take 1 capsule by mouth every evening.     [provider]  ?Ciclopirox 1 % shampoo apply to affected area(s) as directed twice a week 10/24/20     ?clindamycin (CLEOCIN) 300 MG capsule Take 2 capsules by mouth 1 hour prior to dental work ?Patient taking  differently: Pt takes this medication for PRN for dental procedures 05/27/21     ?cycloSPORINE (RESTASIS) 0.05 % ophthalmic emulsion Place 1 drop into both eyes twice daily. 05/29/21     ?diltiazem (TIAZAC) 360 MG 24 hr capsule TAKE 1 CAPSULE (360 MG TOTAL) BY MOUTH DAILY. 02/18/21 02/18/22  Mosie Lukes, MD  ?Estradiol 10 MCG TABS vaginal tablet INSERT 1 TAB VAGINALLY AT BEDTIME EVERY OTHER DAY 07/03/21 07/03/22  Lavonia Drafts, MD  ?gabapentin (NEURONTIN) 300 MG capsule TAKE 1 CAPSULE (300 MG TOTAL) BY MOUTH AT BEDTIME. 03/22/21 03/22/22  Mosie Lukes, MD  ?glucose blood (ONE TOUCH TEST STRIPS) test strip Use as directed twice daily to check blood sugar. DX code E11.9 11/30/17   Mosie Lukes, MD  ?levocetirizine (XYZAL) 5 MG tablet TAKE 1 TABLET (5 MG TOTAL) BY MOUTH EVERY EVENING. 03/22/21 03/22/22  Mosie Lukes, MD  ?levothyroxine (SYNTHROID) 88 MCG tablet TAKE 1 TABLET BY MOUTH ONCE DAILY **SKIP SUNDAY** 02/18/21 02/18/22  Mosie Lukes, MD  ?loteprednol (LOTEMAX) 0.5 % ophthalmic suspension Place one drop into both eyes four times daily with flare up. 05/28/21     ?metFORMIN (GLUCOPHAGE-XR) 750 MG 24 hr tablet TAKE 1 TABLET (750 MG TOTAL) BY MOUTH IN THE MORNING AND AT BEDTIME. 06/20/21 06/20/22  Mosie Lukes, MD  ?metoprolol succinate (TOPROL-XL) 25 MG 24  hr tablet TAKE 1 TABLET BY MOUTH EVERY MORNING AND EVERY NIGHT AT BEDTIME 02/18/21 02/18/22  Mosie Lukes, MD  ?mometasone (NASONEX) 50 MCG/ACT nasal spray USE ONE SPRAY IN EACH NOSTRIL TWICE DAILY IF NEEDED FOR STUFFY NOSE 06/12/20 08/22/21  Ambs, Kathrine Cords, FNP  ?NEXIUM 40 MG capsule TAKE 1 CAPSULE BY MOUTH ONCE DAILY 03/22/21 03/22/22  Mosie Lukes, MD  ?Probiotic Product (PROBIOTIC DAILY PO) Take 1 capsule by mouth daily.    [provider]  ?spironolactone (ALDACTONE) 25 MG tablet TAKE 1 & 1/2 TABLETS BY MOUTH DAILY 03/22/21 03/22/22  Mosie Lukes, MD  ?   ? ?Allergies    ?Codeine, Penicillins, Sulfa antibiotics, and Vicodin  [hydrocodone-acetaminophen]   ? ?Review of Systems   ?Review of Systems ? ?Physical Exam ?Updated Vital Signs ?BP 125/72 (BP Location: Left Arm)   Pulse 76   Temp 98.4 ?F (36.9 ?C) (Oral)   Resp 18   Ht '5\' 5"'$  (1.651 m)   Wt 74.8 kg   SpO2 99%   BMI 27.46 kg/m?  ?Physical Exam ?Vitals and nursing note reviewed. Exam conducted with a chaperone present.  ?Constitutional:   ?   General: She is not in acute distress. ?   Appearance: Normal appearance.  ?HENT:  ?   Head: Normocephalic and atraumatic.  ?Eyes:  ?   General: No scleral icterus. ?   Extraocular Movements: Extraocular movements intact.  ?   Pupils: Pupils are equal, round, and reactive to light.  ?Cardiovascular:  ?   Pulses: Normal pulses.  ?Musculoskeletal:     ?   General: Tenderness present. Normal range of motion.  ?   Comments: Right knee with vertical surgical scar.  Flexion extension fully intact, no point tenderness or effusion noted.  No laxity.  Left wrist with tenderness over the radial side, she is able to flex and extend although this elicits significant subjective pain.  No point tenderness or crepitus  ?Skin: ?   Capillary Refill: Capillary refill takes less than 2 seconds.  ?   Coloration: Skin is not jaundiced.  ?Neurological:  ?   Mental Status: She is alert. Mental status is at baseline.  ?   Coordination: Coordination normal.  ? ? ?ED Results / Procedures / Treatments   ?Labs ?(all labs ordered are listed, but only abnormal results are displayed) ?Labs Reviewed - No data to display ? ?EKG ?None ? ?Radiology ?DG Wrist Complete Left ? ?Result Date: 07/30/2021 ?CLINICAL DATA:  Left wrist pain after fall. EXAM: LEFT WRIST - COMPLETE 3+ VIEW COMPARISON:  None. FINDINGS: There is no evidence of fracture or dislocation. There is no evidence of arthropathy or other focal bone abnormality. Soft tissues are unremarkable. IMPRESSION: Negative. Electronically Signed   By: Marijo Conception M.D.   On: 07/30/2021 14:01  ? ?DG Knee Complete 4 Views  Right ? ?Result Date: 07/30/2021 ?CLINICAL DATA:  Right knee pain after fall. EXAM: RIGHT KNEE - COMPLETE 4+ VIEW COMPARISON:  None. FINDINGS: Status post right total knee arthroplasty. The femoral and tibial components are well situated. No fracture, dislocation or effusion is noted. IMPRESSION: No acute abnormality seen. Electronically Signed   By: Marijo Conception M.D.   On: 07/30/2021 14:02   ? ?Procedures ?Procedures  ? ? ?Medications Ordered in ED ?Medications  ?acetaminophen (TYLENOL) tablet 650 mg (has no administration in time range)  ? ? ?ED Course/ Medical Decision Making/ A&P ?  ?                        ?  Medical Decision Making ?Amount and/or Complexity of Data Reviewed ?Radiology: ordered. ? ? ?This patient presents to the ED for concern of wrist pain/knee pain, this involves an extensive number of treatment options, and is a complaint that carries with it a high risk of complications and morbidity.  The differential diagnosis includes wrist fracture, dislocation, tendon rupture, knee fracture, knee dislocation ? ? ?Additional history obtained:  ? ?Independent historian: nursing note ? ?   ?Imaging Studies ordered: ? ?I directly visualized the DG knee and wrist, which showed no acute process ? ?I agree with the radiologist interpretation ?  ? ?Medicines ordered and prescription drug management: ? ?I ordered medication including: wrist brace, tylenol   ? ?I have reviewed the patients home medicines and have made adjustments as needed ? ? ?Reevaluation: ? ?After the interventions noted above, I reevaluated the patient and found patient appears well, pain improved somewhat ? ? ?Problems addressed / ED Course: ?Knee pain and wrist pain-appears to be myalgias.  Patient had x-rays that did not show any acute fracture dislocation.  She is neurovascularly intact with brisk cap refill, radial pulse 2+ and DP and PT are 2+.  There is no point tenderness or laxity.  We will provide wrist brace for comfort,  encouraged Tylenol arthritis at home and to follow-up with her primary if pain persist.  Do not think any additional work-up or imaging is needed at this time. ?  ?Social Determinants of Health: ?Advanced age ?  ?Disposition: ? ? ?A

## 2021-08-02 ENCOUNTER — Other Ambulatory Visit: Payer: Medicare Other

## 2021-08-02 DIAGNOSIS — R1319 Other dysphagia: Secondary | ICD-10-CM

## 2021-08-02 DIAGNOSIS — Z8619 Personal history of other infectious and parasitic diseases: Secondary | ICD-10-CM

## 2021-08-02 DIAGNOSIS — K449 Diaphragmatic hernia without obstruction or gangrene: Secondary | ICD-10-CM

## 2021-08-05 DIAGNOSIS — S63502A Unspecified sprain of left wrist, initial encounter: Secondary | ICD-10-CM | POA: Diagnosis not present

## 2021-08-05 LAB — HELICOBACTER PYLORI  SPECIAL ANTIGEN
MICRO NUMBER:: 13265606
RESULT:: DETECTED — AB
SPECIMEN QUALITY: ADEQUATE

## 2021-08-06 ENCOUNTER — Other Ambulatory Visit: Payer: Self-pay

## 2021-08-06 ENCOUNTER — Other Ambulatory Visit (HOSPITAL_BASED_OUTPATIENT_CLINIC_OR_DEPARTMENT_OTHER): Payer: Self-pay

## 2021-08-06 MED ORDER — OMEPRAZOLE 20 MG PO CPDR
20.0000 mg | DELAYED_RELEASE_CAPSULE | Freq: Two times a day (BID) | ORAL | 0 refills | Status: DC
  Filled 2021-08-06: qty 28, 14d supply, fill #0

## 2021-08-06 MED ORDER — METRONIDAZOLE 500 MG PO TABS
500.0000 mg | ORAL_TABLET | Freq: Two times a day (BID) | ORAL | 0 refills | Status: AC
  Filled 2021-08-06: qty 28, 14d supply, fill #0

## 2021-08-06 MED ORDER — CLARITHROMYCIN 500 MG PO TABS
500.0000 mg | ORAL_TABLET | Freq: Two times a day (BID) | ORAL | 0 refills | Status: AC
  Filled 2021-08-06: qty 28, 14d supply, fill #0

## 2021-08-13 DIAGNOSIS — C50911 Malignant neoplasm of unspecified site of right female breast: Secondary | ICD-10-CM | POA: Diagnosis not present

## 2021-08-14 DIAGNOSIS — M25562 Pain in left knee: Secondary | ICD-10-CM | POA: Diagnosis not present

## 2021-08-14 DIAGNOSIS — M1712 Unilateral primary osteoarthritis, left knee: Secondary | ICD-10-CM | POA: Diagnosis not present

## 2021-08-14 DIAGNOSIS — G8929 Other chronic pain: Secondary | ICD-10-CM | POA: Diagnosis not present

## 2021-08-15 ENCOUNTER — Other Ambulatory Visit (HOSPITAL_BASED_OUTPATIENT_CLINIC_OR_DEPARTMENT_OTHER): Payer: Self-pay

## 2021-08-19 DIAGNOSIS — E119 Type 2 diabetes mellitus without complications: Secondary | ICD-10-CM | POA: Diagnosis not present

## 2021-08-19 DIAGNOSIS — H40023 Open angle with borderline findings, high risk, bilateral: Secondary | ICD-10-CM | POA: Diagnosis not present

## 2021-08-19 DIAGNOSIS — H04123 Dry eye syndrome of bilateral lacrimal glands: Secondary | ICD-10-CM | POA: Diagnosis not present

## 2021-08-19 DIAGNOSIS — H25813 Combined forms of age-related cataract, bilateral: Secondary | ICD-10-CM | POA: Diagnosis not present

## 2021-08-21 ENCOUNTER — Other Ambulatory Visit (HOSPITAL_BASED_OUTPATIENT_CLINIC_OR_DEPARTMENT_OTHER): Payer: Self-pay

## 2021-08-21 MED ORDER — IBUPROFEN 800 MG PO TABS
800.0000 mg | ORAL_TABLET | Freq: Three times a day (TID) | ORAL | 1 refills | Status: DC
  Filled 2021-08-21: qty 90, 30d supply, fill #0

## 2021-08-22 DIAGNOSIS — C50911 Malignant neoplasm of unspecified site of right female breast: Secondary | ICD-10-CM | POA: Diagnosis not present

## 2021-08-27 DIAGNOSIS — C50911 Malignant neoplasm of unspecified site of right female breast: Secondary | ICD-10-CM | POA: Diagnosis not present

## 2021-08-29 DIAGNOSIS — E1140 Type 2 diabetes mellitus with diabetic neuropathy, unspecified: Secondary | ICD-10-CM | POA: Diagnosis not present

## 2021-09-02 ENCOUNTER — Other Ambulatory Visit: Payer: Self-pay | Admitting: Family Medicine

## 2021-09-02 ENCOUNTER — Other Ambulatory Visit (HOSPITAL_BASED_OUTPATIENT_CLINIC_OR_DEPARTMENT_OTHER): Payer: Self-pay

## 2021-09-02 MED ORDER — METOPROLOL SUCCINATE ER 25 MG PO TB24
ORAL_TABLET | ORAL | 1 refills | Status: DC
  Filled 2021-09-02: qty 180, 90d supply, fill #0
  Filled 2021-11-14: qty 180, 90d supply, fill #1

## 2021-09-02 MED ORDER — DILTIAZEM HCL ER BEADS 360 MG PO CP24
ORAL_CAPSULE | ORAL | 1 refills | Status: DC
  Filled 2021-09-02: qty 90, 90d supply, fill #0
  Filled 2021-11-14: qty 90, 90d supply, fill #1

## 2021-09-02 MED ORDER — LEVOTHYROXINE SODIUM 88 MCG PO TABS
ORAL_TABLET | ORAL | 1 refills | Status: DC
  Filled 2021-09-02: qty 90, 90d supply, fill #0

## 2021-09-03 ENCOUNTER — Other Ambulatory Visit (HOSPITAL_BASED_OUTPATIENT_CLINIC_OR_DEPARTMENT_OTHER): Payer: Self-pay

## 2021-09-03 MED ORDER — MOMETASONE FUROATE 50 MCG/ACT NA SUSP
NASAL | 0 refills | Status: DC
  Filled 2021-09-03: qty 17, 90d supply, fill #0

## 2021-09-04 ENCOUNTER — Other Ambulatory Visit (HOSPITAL_BASED_OUTPATIENT_CLINIC_OR_DEPARTMENT_OTHER): Payer: Self-pay

## 2021-09-10 ENCOUNTER — Telehealth: Payer: Self-pay | Admitting: Family Medicine

## 2021-09-10 ENCOUNTER — Other Ambulatory Visit (HOSPITAL_BASED_OUTPATIENT_CLINIC_OR_DEPARTMENT_OTHER): Payer: Self-pay

## 2021-09-10 MED ORDER — ALENDRONATE SODIUM 70 MG PO TABS
70.0000 mg | ORAL_TABLET | ORAL | 1 refills | Status: DC
  Filled 2021-09-10: qty 12, 84d supply, fill #0
  Filled 2021-12-01: qty 12, 84d supply, fill #1

## 2021-09-10 NOTE — Telephone Encounter (Signed)
90 day refill sent. 

## 2021-09-10 NOTE — Telephone Encounter (Signed)
Pt called wondering if she could have a three month supply of her fosamax so she doesn't have to pick up her medication nearly as much.

## 2021-09-10 NOTE — Progress Notes (Unsigned)
FOLLOW UP Date of Service/Encounter:  09/12/21   Subjective:  Victoria Walker (DOB: [birth year]) is a 71 y.o. female PMHx of esophageal dysphagia who returns to the [facility] on 09/12/2021 in re-evaluation of the following: allergic rhinitis  History obtained from: chart review and patient.  For Review, LV was on 08/21/20  with Gareth Morgan, FNP seen for regular follow-up and symptoms controlled on cetirizine 5 mg once a day, mometasone nasal spray daily, and occasional nasal saline rinses.  Today presents for follow-up. She has to take her allergy pill every day and her nose sprays, or otherwise her nose will stay congested.  She has used Nasonex for years and it worked well.  However recently is getting a generic form which she feels is not as effective. Sometimes her nose is dry and she uses saline spray or Netti pot.  She reports using only distilled water for the Nettie pot.  Otherwise she is doing well, and would like to continue her current regimen.  Allergies as of 09/12/2021       Reactions   Codeine Anxiety   Jittery    Penicillins Itching   Sulfa Antibiotics Itching   Vicodin [hydrocodone-acetaminophen] Itching   ITCHING         Medication List        Accurate as of Sep 12, 2021  1:18 PM. If you have any questions, ask your nurse or doctor.          STOP taking these medications    ibuprofen 800 MG tablet Commonly known as: ADVIL Stopped by: Sigurd Sos, MD   mometasone 50 MCG/ACT nasal spray Commonly known as: NASONEX Stopped by: Sigurd Sos, MD   omeprazole 20 MG capsule Commonly known as: PRILOSEC Stopped by: Sigurd Sos, MD       TAKE these medications    alendronate 70 MG tablet Commonly known as: Fosamax Take 1 tablet (70 mg total) by mouth once a week. Take with a full glass of water on an empty stomach.   aspirin EC 81 MG tablet Take 81 mg by mouth every other day. Swallow whole.   atorvastatin 40 MG tablet Commonly  known as: LIPITOR TAKE 1 TABLET BY MOUTH EVERY OTHER DAY, ALTERNATING WITH 1/2 TABLET   Avapro 75 MG tablet Generic drug: irbesartan TAKE 1 TABLET (75 MG TOTAL) BY MOUTH DAILY.   Ciclopirox 1 % shampoo apply to affected area(s) as directed twice a week   clindamycin 300 MG capsule Commonly known as: CLEOCIN Take 2 capsules by mouth 1 hour prior to dental work   Estradiol 10 MCG Tabs vaginal tablet INSERT 1 TAB VAGINALLY AT BEDTIME EVERY OTHER DAY   gabapentin 300 MG capsule Commonly known as: NEURONTIN TAKE 1 CAPSULE (300 MG TOTAL) BY MOUTH AT BEDTIME.   glucose blood test strip Commonly known as: ONE TOUCH TEST STRIPS Use as directed twice daily to check blood sugar. DX code E11.9   levocetirizine 5 MG tablet Commonly known as: XYZAL TAKE 1 TABLET (5 MG TOTAL) BY MOUTH EVERY EVENING.   levothyroxine 88 MCG tablet Commonly known as: SYNTHROID Take by mouth. What changed: Another medication with the same name was removed. Continue taking this medication, and follow the directions you see here. Changed by: Sigurd Sos, MD   lidocaine 1 % (with preservative) injection Commonly known as: XYLOCAINE by Infiltration route.   Lotemax 0.5 % ophthalmic suspension Generic drug: loteprednol Place one drop into both eyes four times daily with  flare up.   metFORMIN 750 MG 24 hr tablet Commonly known as: GLUCOPHAGE-XR TAKE 1 TABLET (750 MG TOTAL) BY MOUTH IN THE MORNING AND AT BEDTIME.   metoprolol succinate 25 MG 24 hr tablet Commonly known as: TOPROL-XL TAKE 1 TABLET BY MOUTH EVERY MORNING AND EVERY NIGHT AT BEDTIME   NexIUM 40 MG capsule Generic drug: esomeprazole TAKE 1 CAPSULE BY MOUTH ONCE DAILY   OS-CAL 500 + D PO   PROBIOTIC DAILY PO Take 1 capsule by mouth daily.   REFRESH OPTIVE OP Apply to eye.   Restasis 0.05 % ophthalmic emulsion Generic drug: cycloSPORINE Place 1 drop into both eyes twice daily.   spironolactone 25 MG tablet Commonly known as:  ALDACTONE TAKE 1 & 1/2 TABLETS BY MOUTH DAILY   Tiadylt ER 360 MG 24 hr capsule Generic drug: diltiazem TAKE 1 CAPSULE (360 MG TOTAL) BY MOUTH DAILY.   triamcinolone 55 MCG/ACT Aero nasal inhaler Commonly known as: NASACORT Place 2 sprays into the nose daily. Started by: Sigurd Sos, MD   triamcinolone acetonide 40 MG/ML Susp Commonly known as: TRIESENCE Inject into the articular space.   Vitamin D 50 MCG (2000 UT) Caps Take 1 capsule by mouth every evening.       Past Medical History:  Diagnosis Date   Arthritis    "both knees" (07/02/2015)   Cancer of right breast ([place])    Claustrophobia    Dysrhythmia    HX OF FAST HEART RATE AND PALPITATIONS - METOPROLOL HAS HELPED   GERD (gastroesophageal reflux disease)    H/O hiatal hernia    H/O iritis    LEFT EYE - STATES HER EYE BECOMES RED AND VERY SENSITIVE TO LIGHT WHEN FLARE UP OF IRITIS   Heart murmur    "benign; 06/2015"   High cholesterol    History of blood transfusion    "when I had my partial hysterectomy"   History of chickenpox 09/26/2014   History of measles    History of MRSA infection    History of shingles    Hypertension    Hypothyroidism    Medicare annual wellness visit, initial 04/07/2015   Multinodular thyroid    PT HAVING TROUBLE SWALLOWING   Neuropathy    FEET   Primary localized osteoarthritis of right knee    Sciatica of right side 04/25/2015   Spinal headache    spinal headache with epidural   Total knee replacement status, right    Type II diabetes mellitus ([place])    ORAL MEDICATION - NO INSULIN   Wears glasses    Past Surgical History:  Procedure Laterality Date   ABDOMINAL HYSTERECTOMY  1988   ABDOMINOPLASTY  1986   ANTERIOR AND POSTERIOR REPAIR  05/11/2012   Procedure: ANTERIOR (CYSTOCELE) AND POSTERIOR REPAIR (RECTOCELE);  Surgeon: Reece Packer, MD;  Location: [facility];  Service: Urology;;  with graft   BREAST BIOPSY Right    CARDIAC CATHETERIZATION  1999   COLONOSCOPY      CYSTOSCOPY WITH URETHRAL DILATATION  05/11/2012   Procedure: CYSTOSCOPY WITH URETHRAL DILATATION;  Surgeon: Reece Packer, MD;  Location: [facility];  Service: Urology;;   ESOPHAGOGASTRODUODENOSCOPY     HERNIA REPAIR     MASTECTOMY Right 2007   PLANTAR FASCIA SURGERY Left    THYROIDECTOMY N/A 02/23/2014   Procedure: TOTAL THYROIDECTOMY;  Surgeon: Armandina Gemma, MD;  Location: [facility];  Service: General;  Laterality: N/A;   TOTAL KNEE ARTHROPLASTY Right 07/02/2015   TOTAL KNEE ARTHROPLASTY Right  07/02/2015   Procedure: TOTAL KNEE ARTHROPLASTY;  Surgeon: Elsie Saas, MD;  Location: [facility];  Service: Orthopedics;  Laterality: Right;   TOTAL THYROIDECTOMY  1976   removed three tumor (2 on right; 1 on left)   TUBAL LIGATION     UMBILICAL HERNIA REPAIR  1986   VAGINAL PROLAPSE REPAIR  05/11/2012   Procedure: VAGINAL VAULT SUSPENSION;  Surgeon: Reece Packer, MD;  Location: [facility];  Service: Urology;;   VULVA / PERINEUM BIOPSY     vulvar   Otherwise, there have been no changes to her past medical history, surgical history, family history, or social history.  ROS: All others negative except as noted per HPI.   Objective:  BP 114/64 (BP Location: Left Arm, Patient Position: Sitting, Cuff Size: Normal)   Pulse 76   Temp 98.2 F (36.8 C) (Oral)   Resp 16   Ht 5' 4.57" (1.64 m)   Wt 164 lb 14.4 oz (74.8 kg)   BMI 27.81 kg/m  Body mass index is 27.81 kg/m. Physical Exam: General Appearance:  Alert, cooperative, no distress, appears stated age  Head:  Normocephalic, without obvious abnormality, atraumatic  Eyes:  Conjunctiva clear, EOM's intact  Nose: Nares normal, normal mucosa and no visible anterior polyps  Throat: Lips, tongue normal; teeth and gums normal, normal posterior oropharynx  Neck: Supple, symmetrical  Lungs:   clear to auscultation bilaterally, Respirations unlabored, no coughing  Heart:  regular rate and rhythm and no murmur, Appears well perfused  Extremities: No edema   Skin: Skin color, texture, turgor normal, no rashes or lesions on visualized portions of skin  Neurologic: No gross deficits   Assessment/Plan  Allergic rhinitis-controlled Continue levocetirizine 5 mg once a day as needed for runny nose or itch.  Start Nasacort 1-2 sprays in each nostril once a day if needed for a stuffy nose.  In the right nostril, point the applicator out toward the right ear. In the left nostril, point the applicator out toward the left ear Continue saline nasal rinses as needed for nasal symptoms. Use this before any medicated nasal sprays for best result Consider nasal saline gel as needed for dry or irritated nostrils  Call the clinic if this treatment plan is not working well for you  Follow up in 1 year or sooner if needed.    Sigurd Sos, MD  [facility]

## 2021-09-12 ENCOUNTER — Other Ambulatory Visit (HOSPITAL_BASED_OUTPATIENT_CLINIC_OR_DEPARTMENT_OTHER): Payer: Self-pay

## 2021-09-12 ENCOUNTER — Encounter: Payer: Self-pay | Admitting: Internal Medicine

## 2021-09-12 ENCOUNTER — Ambulatory Visit (INDEPENDENT_AMBULATORY_CARE_PROVIDER_SITE_OTHER): Payer: Medicare Other | Admitting: Internal Medicine

## 2021-09-12 VITALS — BP 114/64 | HR 76 | Temp 98.2°F | Resp 16 | Ht 64.57 in | Wt 164.9 lb

## 2021-09-12 DIAGNOSIS — Z79899 Other long term (current) drug therapy: Secondary | ICD-10-CM

## 2021-09-12 DIAGNOSIS — J3089 Other allergic rhinitis: Secondary | ICD-10-CM

## 2021-09-12 MED ORDER — TRIAMCINOLONE ACETONIDE 55 MCG/ACT NA AERO
2.0000 | INHALATION_SPRAY | Freq: Every day | NASAL | 12 refills | Status: DC
Start: 2021-09-12 — End: 2021-11-25
  Filled 2021-09-12: qty 16.9, 1d supply, fill #0

## 2021-09-12 MED ORDER — LEVOCETIRIZINE DIHYDROCHLORIDE 5 MG PO TABS
ORAL_TABLET | Freq: Every evening | ORAL | 3 refills | Status: DC
  Filled 2021-09-12: qty 90, 90d supply, fill #0
  Filled 2021-11-14 – 2021-11-19 (×2): qty 90, 90d supply, fill #1
  Filled 2022-02-12: qty 90, 90d supply, fill #2
  Filled 2022-05-20: qty 90, 90d supply, fill #3

## 2021-09-12 NOTE — Patient Instructions (Addendum)
Allergic rhinitis-controlled Continue levocetirizine 5 mg once a day as needed for runny nose or itch.  Start Nasacort 1-2 sprays in each nostril once a day if needed for a stuffy nose.  In the right nostril, point the applicator out toward the right ear. In the left nostril, point the applicator out toward the left ear-switching from Nasonex Continue saline nasal rinses as needed for nasal symptoms. Use this before any medicated nasal sprays for best result Consider nasal saline gel as needed for dry or irritated nostrils  Call the clinic if this treatment plan is not working well for you  Follow up in 1 year or sooner if needed.

## 2021-09-17 ENCOUNTER — Other Ambulatory Visit (HOSPITAL_BASED_OUTPATIENT_CLINIC_OR_DEPARTMENT_OTHER): Payer: Self-pay

## 2021-09-17 DIAGNOSIS — M25532 Pain in left wrist: Secondary | ICD-10-CM | POA: Diagnosis not present

## 2021-09-17 DIAGNOSIS — S52572A Other intraarticular fracture of lower end of left radius, initial encounter for closed fracture: Secondary | ICD-10-CM | POA: Diagnosis not present

## 2021-09-17 MED ORDER — CYCLOSPORINE 0.05 % OP EMUL
OPHTHALMIC | 3 refills | Status: DC
  Filled 2021-09-17: qty 180, 90d supply, fill #0
  Filled 2021-12-13: qty 180, 90d supply, fill #1
  Filled 2022-03-19: qty 180, 90d supply, fill #2
  Filled 2022-06-19: qty 180, 90d supply, fill #3

## 2021-09-20 ENCOUNTER — Other Ambulatory Visit: Payer: Self-pay | Admitting: Family Medicine

## 2021-09-20 ENCOUNTER — Other Ambulatory Visit (HOSPITAL_BASED_OUTPATIENT_CLINIC_OR_DEPARTMENT_OTHER): Payer: Self-pay

## 2021-09-20 DIAGNOSIS — Z780 Asymptomatic menopausal state: Secondary | ICD-10-CM

## 2021-09-20 DIAGNOSIS — E785 Hyperlipidemia, unspecified: Secondary | ICD-10-CM

## 2021-09-20 DIAGNOSIS — I10 Essential (primary) hypertension: Secondary | ICD-10-CM

## 2021-09-20 DIAGNOSIS — R109 Unspecified abdominal pain: Secondary | ICD-10-CM

## 2021-09-20 DIAGNOSIS — K219 Gastro-esophageal reflux disease without esophagitis: Secondary | ICD-10-CM

## 2021-09-20 MED ORDER — GABAPENTIN 300 MG PO CAPS
ORAL_CAPSULE | Freq: Every day | ORAL | 1 refills | Status: DC
  Filled 2021-09-20: qty 90, 90d supply, fill #0
  Filled 2021-12-13: qty 90, 90d supply, fill #1

## 2021-09-20 MED ORDER — SPIRONOLACTONE 25 MG PO TABS
ORAL_TABLET | Freq: Every day | ORAL | 1 refills | Status: DC
  Filled 2021-09-20: qty 135, 90d supply, fill #0
  Filled 2021-12-13: qty 135, 90d supply, fill #1

## 2021-09-20 MED ORDER — NEXIUM 40 MG PO CPDR
40.0000 mg | DELAYED_RELEASE_CAPSULE | Freq: Every day | ORAL | 1 refills | Status: DC
  Filled 2021-09-20: qty 90, 90d supply, fill #0
  Filled 2021-12-13: qty 90, 90d supply, fill #1

## 2021-09-23 ENCOUNTER — Other Ambulatory Visit (HOSPITAL_BASED_OUTPATIENT_CLINIC_OR_DEPARTMENT_OTHER): Payer: Self-pay

## 2021-09-24 ENCOUNTER — Ambulatory Visit (INDEPENDENT_AMBULATORY_CARE_PROVIDER_SITE_OTHER): Payer: Medicare Other | Admitting: Podiatry

## 2021-09-24 DIAGNOSIS — L84 Corns and callosities: Secondary | ICD-10-CM | POA: Diagnosis not present

## 2021-09-24 DIAGNOSIS — E1140 Type 2 diabetes mellitus with diabetic neuropathy, unspecified: Secondary | ICD-10-CM

## 2021-09-24 NOTE — Progress Notes (Signed)
  Subjective:  Patient ID: Victoria Walker, female    DOB: 1950/09/02,  MRN: [id number]  No chief complaint on file.   71 y.o. female returns with a new issue she has a callus on the left foot that has become very painful  Objective:  Physical Exam: warm, good capillary refill, no trophic changes or ulcerative lesions, normal DP and PT pulses and somewhat reduced monofilament exam, there is a painful callus lesion submetatarsal 5 on the left foot   Assessment:   1. Callus of foot   2. Type 2 diabetes mellitus with diabetic neuropathy, without long-term current use of insulin ([place])        Plan:  Patient was evaluated and treated and all questions answered.  All symptomatic hyperkeratoses were safely debrided with a sterile #15 blade to patient's level of comfort without incident. We discussed preventative and palliative care of these lesions including supportive and accommodative shoegear, padding, prefabricated and custom molded accommodative orthoses, use of a pumice stone and lotions/creams daily. I applied a dancers pad to her orthotic on the left foot.  Lanae Crumbly, DPM 09/24/2021

## 2021-09-25 ENCOUNTER — Other Ambulatory Visit (HOSPITAL_BASED_OUTPATIENT_CLINIC_OR_DEPARTMENT_OTHER): Payer: Self-pay

## 2021-09-25 DIAGNOSIS — C50911 Malignant neoplasm of unspecified site of right female breast: Secondary | ICD-10-CM | POA: Diagnosis not present

## 2021-09-26 ENCOUNTER — Other Ambulatory Visit (HOSPITAL_BASED_OUTPATIENT_CLINIC_OR_DEPARTMENT_OTHER): Payer: Self-pay

## 2021-09-27 ENCOUNTER — Encounter (HOSPITAL_COMMUNITY): Payer: Self-pay | Admitting: Gastroenterology

## 2021-09-27 ENCOUNTER — Other Ambulatory Visit (HOSPITAL_BASED_OUTPATIENT_CLINIC_OR_DEPARTMENT_OTHER): Payer: Self-pay

## 2021-10-08 DIAGNOSIS — S52572A Other intraarticular fracture of lower end of left radius, initial encounter for closed fracture: Secondary | ICD-10-CM | POA: Diagnosis not present

## 2021-10-08 DIAGNOSIS — M1712 Unilateral primary osteoarthritis, left knee: Secondary | ICD-10-CM | POA: Diagnosis not present

## 2021-10-08 DIAGNOSIS — M25532 Pain in left wrist: Secondary | ICD-10-CM | POA: Diagnosis not present

## 2021-10-09 ENCOUNTER — Encounter (HOSPITAL_COMMUNITY): Payer: Self-pay | Admitting: Gastroenterology

## 2021-10-09 ENCOUNTER — Ambulatory Visit (HOSPITAL_COMMUNITY)
Admission: RE | Admit: 2021-10-09 | Discharge: 2021-10-09 | Disposition: A | Discharge status: Home / Self Care | Payer: Medicare Other | Attending: Gastroenterology | Admitting: Gastroenterology

## 2021-10-09 ENCOUNTER — Encounter (HOSPITAL_COMMUNITY)
Admission: RE | Disposition: A | Discharge status: Home / Self Care | Payer: Self-pay | Source: Home / Self Care | Attending: Gastroenterology

## 2021-10-09 DIAGNOSIS — R1310 Dysphagia, unspecified: Secondary | ICD-10-CM

## 2021-10-09 DIAGNOSIS — R1319 Other dysphagia: Secondary | ICD-10-CM | POA: Diagnosis not present

## 2021-10-09 SURGERY — MANOMETRY, ESOPHAGUS

## 2021-10-09 MED ORDER — LIDOCAINE VISCOUS HCL 2 % MT SOLN
OROMUCOSAL | Status: AC
  Filled 2021-10-09: qty 15

## 2021-10-09 SURGICAL SUPPLY — 2 items
FACESHIELD LNG OPTICON STERILE (SAFETY) IMPLANT
GLOVE BIO SURGEON STRL SZ8 (GLOVE) ×4 IMPLANT

## 2021-10-09 NOTE — Progress Notes (Signed)
Esophageal Manometry done per protocol. Patient tolerated well without distress or complication.  

## 2021-10-10 ENCOUNTER — Encounter (HOSPITAL_COMMUNITY): Payer: Self-pay | Admitting: Gastroenterology

## 2021-10-29 DIAGNOSIS — M25532 Pain in left wrist: Secondary | ICD-10-CM | POA: Diagnosis not present

## 2021-10-29 DIAGNOSIS — S52572A Other intraarticular fracture of lower end of left radius, initial encounter for closed fracture: Secondary | ICD-10-CM | POA: Diagnosis not present

## 2021-11-05 ENCOUNTER — Other Ambulatory Visit: Payer: Self-pay

## 2021-11-05 ENCOUNTER — Other Ambulatory Visit (HOSPITAL_BASED_OUTPATIENT_CLINIC_OR_DEPARTMENT_OTHER): Payer: Self-pay

## 2021-11-05 MED ORDER — FLUCONAZOLE 150 MG PO TABS
150.0000 mg | ORAL_TABLET | Freq: Once | ORAL | 1 refills | Status: AC
  Filled 2021-11-05: qty 1, 1d supply, fill #0
  Filled 2021-11-10: qty 1, 1d supply, fill #1

## 2021-11-05 MED ORDER — FLUCONAZOLE 150 MG PO TABS
150.0000 mg | ORAL_TABLET | Freq: Once | ORAL | 0 refills | Status: DC
  Filled 2021-11-05: qty 1, 1d supply, fill #0

## 2021-11-05 NOTE — Progress Notes (Signed)
Diflucan requested by patient because she is currently on antibiotic. [facility]

## 2021-11-11 ENCOUNTER — Other Ambulatory Visit (HOSPITAL_BASED_OUTPATIENT_CLINIC_OR_DEPARTMENT_OTHER): Payer: Self-pay

## 2021-11-11 ENCOUNTER — Ambulatory Visit (INDEPENDENT_AMBULATORY_CARE_PROVIDER_SITE_OTHER): Payer: Medicare Other

## 2021-11-11 DIAGNOSIS — Z0000 Encounter for general adult medical examination without abnormal findings: Secondary | ICD-10-CM

## 2021-11-11 NOTE — Progress Notes (Signed)
Subjective:   Victoria Walker is a 71 y.o. female who presents for Medicare Annual (Subsequent) preventive examination.  I connected with  Orlene Plum on 11/11/21 by a audio enabled telemedicine application and verified that I am speaking with the correct person using two identifiers.  Patient Location: Home  Provider Location: Office/Clinic  I discussed the limitations of evaluation and management by telemedicine. The patient expressed understanding and agreed to proceed.   Review of Systems     Cardiac Risk Factors include: none;hypertension;diabetes mellitus;dyslipidemia     Objective:    There were no vitals filed for this visit. There is no height or weight on file to calculate BMI.     11/11/2021    9:05 AM 07/30/2021    1:30 PM 11/07/2020    3:44 PM 03/21/2019    8:57 AM 05/17/2018   10:32 AM 03/11/2018    8:53 AM 03/17/2017    3:38 PM  Advanced Directives  Does Patient Have a Medical Advance Directive? Yes Yes Yes Yes Yes Yes Yes  Type of Paramedic of [place];Living will [place];Living will [place];Living will [place];Living will  [place];Living will   Does patient want to make changes to medical advance directive? No - Patient declined   No - Patient declined No - Patient declined  No - Patient declined  Copy of [place] in Chart? Yes - validated most recent copy scanned in chart (See row information) No - copy requested Yes - validated most recent copy scanned in chart (See row information) Yes - validated most recent copy scanned in chart (See row information)  Yes - validated most recent copy scanned in chart (See row information)     Current Medications (verified) Outpatient Encounter Medications as of 11/11/2021  Medication Sig   alendronate (FOSAMAX) 70 MG tablet Take 1 tablet (70 mg total) by mouth once a week. Take with a  full glass of water on an empty stomach.   aspirin 81 MG EC tablet Take 81 mg by mouth every other day. Swallow whole.   atorvastatin (LIPITOR) 40 MG tablet TAKE 1 TABLET BY MOUTH EVERY OTHER DAY, ALTERNATING WITH 1/2 TABLET   AVAPRO 75 MG tablet TAKE 1 TABLET (75 MG TOTAL) BY MOUTH DAILY.   Calcium Carbonate-Vitamin D (OS-CAL 500 + D PO)    Carboxymethylcellul-Glycerin (REFRESH OPTIVE OP) Apply to eye.   Cholecalciferol (VITAMIN D) 2000 UNITS CAPS Take 1 capsule by mouth every evening.    Ciclopirox 1 % shampoo apply to affected area(s) as directed twice a week   clindamycin (CLEOCIN) 300 MG capsule Take 2 capsules by mouth 1 hour prior to dental work   cycloSPORINE (RESTASIS) 0.05 % ophthalmic emulsion Place 1 drop into both eyes twice daily.   cycloSPORINE (RESTASIS) 0.05 % ophthalmic emulsion Place 1 drop into both eyes 2 (two) times daily   diltiazem (TIADYLT ER) 360 MG 24 hr capsule TAKE 1 CAPSULE (360 MG TOTAL) BY MOUTH DAILY.   Estradiol 10 MCG TABS vaginal tablet INSERT 1 TAB VAGINALLY AT BEDTIME EVERY OTHER DAY   fluconazole (DIFLUCAN) 150 MG tablet Take 1 tablet (150 mg total) by mouth once for 1 dose.   gabapentin (NEURONTIN) 300 MG capsule TAKE 1 CAPSULE (300 MG TOTAL) BY MOUTH AT BEDTIME.   glucose blood (ONE TOUCH TEST STRIPS) test strip Use as directed twice daily to check blood sugar. DX code E11.9  levocetirizine (XYZAL) 5 MG tablet TAKE 1 TABLET (5 MG TOTAL) BY MOUTH EVERY EVENING.   levothyroxine (SYNTHROID) 88 MCG tablet Take by mouth.   lidocaine (XYLOCAINE) 1 % (with preservative) injection by Infiltration route.   loteprednol (LOTEMAX) 0.5 % ophthalmic suspension Place one drop into both eyes four times daily with flare up.   metFORMIN (GLUCOPHAGE-XR) 750 MG 24 hr tablet TAKE 1 TABLET (750 MG TOTAL) BY MOUTH IN THE MORNING AND AT BEDTIME.   metoprolol succinate (TOPROL-XL) 25 MG 24 hr tablet TAKE 1 TABLET BY MOUTH EVERY MORNING AND EVERY NIGHT AT BEDTIME   NEXIUM 40 MG  capsule TAKE 1 CAPSULE BY MOUTH ONCE DAILY   Probiotic Product (PROBIOTIC DAILY PO) Take 1 capsule by mouth daily.   spironolactone (ALDACTONE) 25 MG tablet TAKE 1 & 1/2 TABLETS BY MOUTH DAILY   triamcinolone (NASACORT) 55 MCG/ACT AERO nasal inhaler Place 2 sprays into the nose daily.   triamcinolone acetonide (TRIESENCE) 40 MG/ML SUSP Inject into the articular space.   No facility-administered encounter medications on file as of 11/11/2021.    Allergies (verified) Codeine, Penicillins, Sulfa antibiotics, and Vicodin [hydrocodone-acetaminophen]   History: Past Medical History:  Diagnosis Date   Arthritis    "both knees" (07/02/2015)   Cancer of right breast (HCC)    Claustrophobia    Dysrhythmia    HX OF FAST HEART RATE AND PALPITATIONS - METOPROLOL HAS HELPED   GERD (gastroesophageal reflux disease)    H/O hiatal hernia    H/O iritis    LEFT EYE - STATES HER EYE BECOMES RED AND VERY SENSITIVE TO LIGHT WHEN FLARE UP OF IRITIS   Heart murmur    "benign; 06/2015"   High cholesterol    History of blood transfusion    "when I had my partial hysterectomy"   History of chickenpox 09/26/2014   History of measles    History of MRSA infection    History of shingles    Hypertension    Hypothyroidism    Medicare annual wellness visit, initial 04/07/2015   Multinodular thyroid    PT HAVING TROUBLE SWALLOWING   Neuropathy    FEET   Primary localized osteoarthritis of right knee    Sciatica of right side 04/25/2015   Spinal headache    spinal headache with epidural   Total knee replacement status, right    Type II diabetes mellitus ([place])    ORAL MEDICATION - NO INSULIN   Wears glasses    Past Surgical History:  Procedure Laterality Date   ABDOMINAL HYSTERECTOMY  1988   ABDOMINOPLASTY  1986   ANTERIOR AND POSTERIOR REPAIR  05/11/2012   Procedure: ANTERIOR (CYSTOCELE) AND POSTERIOR REPAIR (RECTOCELE);  Surgeon: Reece Packer, MD;  Location: [facility];  Service: Urology;;  with graft    BREAST BIOPSY Right    CARDIAC CATHETERIZATION  1999   COLONOSCOPY     CYSTOSCOPY WITH URETHRAL DILATATION  05/11/2012   Procedure: CYSTOSCOPY WITH URETHRAL DILATATION;  Surgeon: Reece Packer, MD;  Location: [facility];  Service: Urology;;   ESOPHAGEAL MANOMETRY N/A 10/09/2021   Procedure: ESOPHAGEAL MANOMETRY (EM);  Surgeon: Irving Copas., MD;  [facility]: [facility];  Service: Gastroenterology;  Laterality: N/A;   ESOPHAGOGASTRODUODENOSCOPY     HERNIA REPAIR     MASTECTOMY Right 2007   PLANTAR FASCIA SURGERY Left    THYROIDECTOMY N/A 02/23/2014   Procedure: TOTAL THYROIDECTOMY;  Surgeon: Armandina Gemma, MD;  Location: [facility];  Service: General;  Laterality: N/A;  TOTAL KNEE ARTHROPLASTY Right 07/02/2015   TOTAL KNEE ARTHROPLASTY Right 07/02/2015   Procedure: TOTAL KNEE ARTHROPLASTY;  Surgeon: Elsie Saas, MD;  Location: [facility];  Service: Orthopedics;  Laterality: Right;   TOTAL THYROIDECTOMY  1976   removed three tumor (2 on right; 1 on left)   TUBAL LIGATION     UMBILICAL HERNIA REPAIR  1986   VAGINAL PROLAPSE REPAIR  05/11/2012   Procedure: VAGINAL VAULT SUSPENSION;  Surgeon: Reece Packer, MD;  Location: [facility];  Service: Urology;;   VULVA / PERINEUM BIOPSY     vulvar   Family History  Problem Relation Age of Onset   Hypertension Mother    Heart disease Mother        pacer, CHF   Hyperlipidemia Mother    Kidney disease Mother        1 kidney due to stone   Kidney Stones Mother    Hypertension Father    Diabetes Father    Alcohol abuse Father    Lung cancer Father        lung, psa   Diabetes Sister    Hypertension Sister    Breast cancer Sister    Diabetes Brother    Hypertension Brother    Hyperlipidemia Brother    Kidney disease Brother        dialysis   Stroke Brother    Hypertension Maternal Grandmother    Heart disease Maternal Grandmother    Stroke Maternal Grandmother    Hypertension Maternal Grandfather    Hypertension Daughter     Arthritis Son    Hyperlipidemia Sister    Hypertension Sister    Diabetes Sister    Thyroid disease Sister    Kidney disease Sister    Endometriosis Sister    Breast cancer Sister        thyroid and breast   Thyroid cancer Sister    Arthritis Sister        back pain   Hypertension Sister    Thyroid disease Sister    Hypertension Brother    Hyperlipidemia Brother    Hypertension Brother    Hyperlipidemia Brother    Benign prostatic hyperplasia Brother    Hypertension Daughter    Hyperlipidemia Daughter    Diabetes Daughter    Hypertension Maternal Aunt    Hypertension Maternal Uncle    Hypertension Paternal Aunt    Cancer Paternal Aunt    Hypertension Paternal Uncle    Colon cancer Neg Hx    Stomach cancer Neg Hx    Esophageal cancer Neg Hx    Pancreatic cancer Neg Hx    Liver disease Neg Hx    Rectal cancer Neg Hx    Inflammatory bowel disease Neg Hx    Social History   Socioeconomic History   Marital status: Married    Spouse name: Not on file   Number of children: 3   Years of education: Not on file   Highest education level: Not on file  Occupational History    Comment: Retired  Tobacco Use   Smoking status: Never   Smokeless tobacco: Never  Vaping Use   Vaping Use: Never used  Substance and Sexual Activity   Alcohol use: No    Alcohol/week: 0.0 standard drinks of alcohol   Drug use: No   Sexual activity: Not Currently    Birth control/protection: Surgical    Comment: lives with husband, retired from various jobs, diabetic diet  Other Topics Concern   Not  on file  Social History Narrative   Not on file   Social Determinants of Health   Financial Resource Strain: Low Risk  (11/07/2020)   Overall Financial Resource Strain (CARDIA)    Difficulty of Paying Living Expenses: Not hard at all  Food Insecurity: No Food Insecurity (11/07/2020)   Hunger Vital Sign    Worried About Running Out of Food in the Last Year: Never true    Ran Out of Food in the  Last Year: Never true  Transportation Needs: No Transportation Needs (11/07/2020)   PRAPARE - Hydrologist (Medical): No    Lack of Transportation (Non-Medical): No  Physical Activity: Insufficiently Active (11/07/2020)   Exercise Vital Sign    Days of Exercise per Week: 2 days    Minutes of Exercise per Session: 20 min  Stress: No Stress Concern Present (11/07/2020)   [place]    Feeling of Stress : Not at all  Social Connections: Moderately Integrated (11/07/2020)   Social Connection and Isolation Panel [NHANES]    Frequency of Communication with Friends and Family: More than three times a week    Frequency of Social Gatherings with Friends and Family: More than three times a week    Attends Religious Services: More than 4 times per year    Active Member of [employer] or Organizations: No    Attends Music therapist: Never    Marital Status: Married    Tobacco Counseling Counseling given: Not Answered   Clinical Intake:  Pre-visit preparation completed: Yes  Pain : No/denies pain     Nutritional Risks: None Diabetes: Yes CBG done?: No Did pt. bring in CBG monitor from home?: No  How often do you need to have someone help you when you read instructions, pamphlets, or other written materials from your doctor or pharmacy?: 1 - Never  Diabetic?yes Nutrition Risk Assessment:  Has the patient had any N/V/D within the last 2 months?  No  Does the patient have any non-healing wounds?  No  Has the patient had any unintentional weight loss or weight gain?  No   Diabetes:  Is the patient diabetic?  Yes  If diabetic, was a CBG obtained today?  No  Did the patient bring in their glucometer from home?  No  How often do you monitor your CBG's? Once daily.   Financial Strains and Diabetes Management:  Are you having any financial strains with the device, your supplies or your  medication? No .  Does the patient want to be seen by Chronic Care Management for management of their diabetes?  No  Would the patient like to be referred to a Nutritionist or for Diabetic Management?  No   Diabetic Exams:  Diabetic Eye Exam: Completed 11/27/20 Diabetic Foot Exam: Overdue, Pt has been advised about the importance in completing this exam. Pt is scheduled for diabetic foot exam on not yet .   Interpreter Needed?: No  Information entered by :: [place] of Daily Living    11/11/2021    9:12 AM  In your present state of health, do you have any difficulty performing the following activities:  Hearing? 0  Vision? 0  Difficulty concentrating or making decisions? 0  Walking or climbing stairs? 1  Dressing or bathing? 0  Preparing Food and eating ? N  Using the Toilet? N  In the past six months, have you accidently leaked  urine? N  Do you have problems with loss of bowel control? N  Managing your Medications? N  Managing your Finances? N  Housekeeping or managing your Housekeeping? N    Patient Care Team: Mosie Lukes, MD as PCP - General (Family Medicine) Juanita Craver, MD as Consulting Physician (Gastroenterology) [place], Lois Huxley, MD (Ophthalmology) Sheard, Briscoe Burns, DPM (Inactive) as Consulting Physician (Podiatry) Iona Beard, OD as Referring Physician (Optometry) Charlies Silvers, MD (Inactive) as Consulting Physician (Allergy and Immunology) Lavonia Drafts, MD as Consulting Physician (Obstetrics and Gynecology) Elsie Saas, MD as Consulting Physician (Orthopedic Surgery) Mammography, [facility] (Diagnostic Radiology)  Indicate any recent Medical Services you may have received from other than [facility] providers in the past year (date may be approximate).     Assessment:   This is a routine wellness examination for [facility].  Hearing/Vision screen No results found.  Dietary issues and exercise activities discussed: Current  Exercise Habits: The patient does not participate in regular exercise at present, Exercise limited by: orthopedic condition(s)   Goals Addressed   None    Depression Screen    11/11/2021    9:07 AM 07/08/2021   10:28 AM 03/08/2021    1:02 PM 11/30/2020   10:49 AM 11/07/2020    3:47 PM 05/28/2020    9:13 AM 03/21/2019    9:00 AM  PHQ 2/9 Scores  PHQ - 2 Score 0 0 0 0 0 0 0  PHQ- 9 Score      1     Fall Risk    11/11/2021    9:06 AM 07/08/2021   10:28 AM 03/08/2021    1:02 PM 11/07/2020    3:45 PM 05/28/2020    9:14 AM  Fall Risk   Falls in the past year? 1 0 0 0 0  Number falls in past yr: 0 0 0 0 0  Injury with Fall? 1 0 0 0 0  Risk for fall due to : Impaired balance/gait No Fall Risks     Follow up Falls evaluation completed Falls evaluation completed Falls evaluation completed Falls prevention discussed     FALL RISK PREVENTION PERTAINING TO THE HOME:  Any stairs in or around the home? Yes  If so, are there any without handrails? No  Home free of loose throw rugs in walkways, pet beds, electrical cords, etc? Yes  Adequate lighting in your home to reduce risk of falls? Yes   ASSISTIVE DEVICES UTILIZED TO PREVENT FALLS:  Life alert? No  Use of a cane, walker or w/c? No  Grab bars in the bathroom? Yes  Shower chair or bench in shower? Yes  Elevated toilet seat or a handicapped toilet? Yes   TIMED UP AND GO:  Was the test performed? No .    Cognitive Function:    03/10/2017    9:34 AM 02/25/2016   10:56 AM 02/22/2015    9:56 AM  MMSE - Mini Mental State Exam  Orientation to time '5 5 5  '$ Orientation to Place '5 5 5  '$ Registration '3 3 3  '$ Attention/ Calculation '5 5 5  '$ Recall '3 3 1  '$ Language- name 2 objects '2 2 2  '$ Language- repeat '1 1 1  '$ Language- follow 3 step command '3 3 3  '$ Language- read & follow direction '1 1 1  '$ Write a sentence '1 1 1  '$ Copy design 1 0 1  Total score '30 29 28        '$ 11/11/2021    9:17 AM  6CIT Screen  What Year? 0 points  What month? 0  points  What time? 0 points  Count back from 20 0 points  Months in reverse 0 points  Repeat phrase 0 points  Total Score 0 points    Immunizations Immunization History  Administered Date(s) Administered   Fluad Quad(high Dose 65+) 12/17/2018, 01/21/2021   Influenza Whole 01/28/2017   Influenza, High Dose Seasonal PF 01/21/2016, 02/04/2017, 01/28/2018   Influenza,inj,Quad PF,6+ Mos 01/04/2013, 01/23/2014, 12/28/2014   PFIZER Comirnaty(Gray Top)Covid-19 Tri-Sucrose Vaccine 10/26/2020   PFIZER(Purple Top)SARS-COV-2 Vaccination 06/12/2019, 07/06/2019, 02/06/2020   Pfizer Covid-19 Vaccine Bivalent Booster 38yr & up 02/05/2021   Pneumococcal Conjugate-13 12/28/2014   Pneumococcal Polysaccharide-23 05/12/2012, 10/27/2017   Tdap 05/02/2014   Zoster Recombinat (Shingrix) 01/15/2018, 03/25/2018    TDAP status: Up to date  Flu Vaccine status: Up to date  Pneumococcal vaccine status: Up to date  Covid-19 vaccine status: Completed vaccines  Qualifies for Shingles Vaccine? Yes   Zostavax completed No   Shingrix Completed?: Yes  Screening Tests Health Maintenance  Topic Date Due   FOOT EXAM  10/28/2018   INFLUENZA VACCINE  11/19/2021   OPHTHALMOLOGY EXAM  11/27/2021   MAMMOGRAM  11/28/2021   HEMOGLOBIN A1C  01/08/2022   COLONOSCOPY (Pts 45-461yrInsurance coverage will need to be confirmed)  02/14/2023   TETANUS/TDAP  05/02/2024   Pneumonia Vaccine 6588Years old  Completed   DEXA SCAN  Completed   COVID-19 Vaccine  Completed   Hepatitis C Screening  Completed   Zoster Vaccines- Shingrix  Completed   HPV VACCINES  Aged Out    Health Maintenance  Health Maintenance Due  Topic Date Due   FOOT EXAM  10/28/2018    Colorectal cancer screening: Type of screening: Colonoscopy. Completed 02/14/20. Repeat every 3 years  Mammogram status: Completed 11/28/20. Repeat every year  Bone Density status: Completed 07/11/21. Results reflect: Bone density results: OSTEOPOROSIS. Repeat  every 2 years.  Lung Cancer Screening: (Low Dose CT Chest recommended if Age 72-80ears, 30 pack-year currently smoking OR have quit w/in 15years.) does not qualify.   Lung Cancer Screening Referral: N/A  Additional Screening:  Hepatitis C Screening: does qualify; Completed 02/22/15  Vision Screening: Recommended annual ophthalmology exams for early detection of glaucoma and other disorders of the eye. Is the patient up to date with their annual eye exam?  Yes  Who is the provider or what is the name of the office in which the patient attends annual eye exams? Dr. BaPamala Hurryf pt is not established with a provider, would they like to be referred to a provider to establish care? No .   Dental Screening: Recommended annual dental exams for proper oral hygiene  Community Resource Referral / Chronic Care Management: CRR required this visit?  No   CCM required this visit?  No      Plan:     I have personally reviewed and noted the following in the patient's chart:   Medical and social history Use of alcohol, tobacco or illicit drugs  Current medications and supplements including opioid prescriptions.  Functional ability and status Nutritional status Physical activity Advanced directives List of other physicians Hospitalizations, surgeries, and ER visits in previous 12 months Vitals Screenings to include cognitive, depression, and falls Referrals and appointments  In addition, I have reviewed and discussed with patient certain preventive protocols, quality metrics, and best practice recommendations. A written personalized care plan for preventive services as well as general preventive health recommendations were  provided to patient.   Due to this being a telephonic visit, the after visit summary with patients personalized plan was offered to patient via mail or my-chart.  Patient would like to access on my-chart.    Duard Brady Jaycee Pelzer, [place]   11/11/2021   Nurse Notes:  none

## 2021-11-11 NOTE — Patient Instructions (Signed)
Victoria Walker , Thank you for taking time to come for your Medicare Wellness Visit. I appreciate your ongoing commitment to your health goals. Please review the following plan we discussed and let me know if I can assist you in the future.   Screening recommendations/referrals: Colonoscopy: 02/14/20 due 02/14/23 Mammogram: 11/28/20 due 11/28/21 Bone Density: 07/11/21 due 07/12/23 Recommended yearly ophthalmology/optometry visit for glaucoma screening and checkup Recommended yearly dental visit for hygiene and checkup  Vaccinations: Influenza vaccine: up to date  Pneumococcal vaccine: up to date Tdap vaccine: up to date Shingles vaccine: up to date   Covid-19:completed  Advanced directives: yes, on file  Conditions/risks identified: see problem   Next appointment: Follow up in one year for your annual wellness visit 11/13/22   Preventive Care 51 Years and Older, Female Preventive care refers to lifestyle choices and visits with your health care provider that can promote health and wellness. What does preventive care include? A yearly physical exam. This is also called an annual well check. Dental exams once or twice a year. Routine eye exams. Ask your health care provider how often you should have your eyes checked. Personal lifestyle choices, including: Daily care of your teeth and gums. Regular physical activity. Eating a healthy diet. Avoiding tobacco and drug use. Limiting alcohol use. Practicing safe sex. Taking low-dose aspirin every day. Taking vitamin and mineral supplements as recommended by your health care provider. What happens during an annual well check? The services and screenings done by your health care provider during your annual well check will depend on your age, overall health, lifestyle risk factors, and family history of disease. Counseling  Your health care provider may ask you questions about your: Alcohol use. Tobacco use. Drug use. Emotional  well-being. Home and relationship well-being. Sexual activity. Eating habits. History of falls. Memory and ability to understand (cognition). Work and work Statistician. Reproductive health. Screening  You may have the following tests or measurements: Height, weight, and BMI. Blood pressure. Lipid and cholesterol levels. These may be checked every 5 years, or more frequently if you are over 81 years old. Skin check. Lung cancer screening. You may have this screening every year starting at age 31 if you have a 30-pack-year history of smoking and currently smoke or have quit within the past 15 years. Fecal occult blood test (FOBT) of the stool. You may have this test every year starting at age 29. Flexible sigmoidoscopy or colonoscopy. You may have a sigmoidoscopy every 5 years or a colonoscopy every 10 years starting at age 72. Hepatitis C blood test. Hepatitis B blood test. Sexually transmitted disease (STD) testing. Diabetes screening. This is done by checking your blood sugar (glucose) after you have not eaten for a while (fasting). You may have this done every 1-3 years. Bone density scan. This is done to screen for osteoporosis. You may have this done starting at age 66. Mammogram. This may be done every 1-2 years. Talk to your health care provider about how often you should have regular mammograms. Talk with your health care provider about your test results, treatment options, and if necessary, the need for more tests. Vaccines  Your health care provider may recommend certain vaccines, such as: Influenza vaccine. This is recommended every year. Tetanus, diphtheria, and acellular pertussis (Tdap, Td) vaccine. You may need a Td booster every 10 years. Zoster vaccine. You may need this after age 82. Pneumococcal 13-valent conjugate (PCV13) vaccine. One dose is recommended after age 39. Pneumococcal polysaccharide (PPSV23) vaccine.  One dose is recommended after age 25. Talk to your  health care provider about which screenings and vaccines you need and how often you need them. This information is not intended to replace advice given to you by your health care provider. Make sure you discuss any questions you have with your health care provider. Document Released: 05/04/2015 Document Revised: 12/26/2015 Document Reviewed: 02/06/2015 Elsevier Interactive Patient Education  2017 [place] Prevention in the Home Falls can cause injuries. They can happen to people of all ages. There are many things you can do to make your home safe and to help prevent falls. What can I do on the outside of my home? Regularly fix the edges of walkways and driveways and fix any cracks. Remove anything that might make you trip as you walk through a door, such as a raised step or threshold. Trim any bushes or trees on the path to your home. Use bright outdoor lighting. Clear any walking paths of anything that might make someone trip, such as rocks or tools. Regularly check to see if handrails are loose or broken. Make sure that both sides of any steps have handrails. Any raised decks and porches should have guardrails on the edges. Have any leaves, snow, or ice cleared regularly. Use sand or salt on walking paths during winter. Clean up any spills in your garage right away. This includes oil or grease spills. What can I do in the bathroom? Use night lights. Install grab bars by the toilet and in the tub and shower. Do not use towel bars as grab bars. Use non-skid mats or decals in the tub or shower. If you need to sit down in the shower, use a plastic, non-slip stool. Keep the floor dry. Clean up any water that spills on the floor as soon as it happens. Remove soap buildup in the tub or shower regularly. Attach bath mats securely with double-sided non-slip rug tape. Do not have throw rugs and other things on the floor that can make you trip. What can I do in the bedroom? Use night  lights. Make sure that you have a light by your bed that is easy to reach. Do not use any sheets or blankets that are too big for your bed. They should not hang down onto the floor. Have a firm chair that has side arms. You can use this for support while you get dressed. Do not have throw rugs and other things on the floor that can make you trip. What can I do in the kitchen? Clean up any spills right away. Avoid walking on wet floors. Keep items that you use a lot in easy-to-reach places. If you need to reach something above you, use a strong step stool that has a grab bar. Keep electrical cords out of the way. Do not use floor polish or wax that makes floors slippery. If you must use wax, use non-skid floor wax. Do not have throw rugs and other things on the floor that can make you trip. What can I do with my stairs? Do not leave any items on the stairs. Make sure that there are handrails on both sides of the stairs and use them. Fix handrails that are broken or loose. Make sure that handrails are as long as the stairways. Check any carpeting to make sure that it is firmly attached to the stairs. Fix any carpet that is loose or worn. Avoid having throw rugs at the top or bottom of  the stairs. If you do have throw rugs, attach them to the floor with carpet tape. Make sure that you have a light switch at the top of the stairs and the bottom of the stairs. If you do not have them, ask someone to add them for you. What else can I do to help prevent falls? Wear shoes that: Do not have high heels. Have rubber bottoms. Are comfortable and fit you well. Are closed at the toe. Do not wear sandals. If you use a stepladder: Make sure that it is fully opened. Do not climb a closed stepladder. Make sure that both sides of the stepladder are locked into place. Ask someone to hold it for you, if possible. Clearly mark and make sure that you can see: Any grab bars or handrails. First and last  steps. Where the edge of each step is. Use tools that help you move around (mobility aids) if they are needed. These include: Canes. Walkers. Scooters. Crutches. Turn on the lights when you go into a dark area. Replace any light bulbs as soon as they burn out. Set up your furniture so you have a clear path. Avoid moving your furniture around. If any of your floors are uneven, fix them. If there are any pets around you, be aware of where they are. Review your medicines with your doctor. Some medicines can make you feel dizzy. This can increase your chance of falling. Ask your doctor what other things that you can do to help prevent falls. This information is not intended to replace advice given to you by your health care provider. Make sure you discuss any questions you have with your health care provider. Document Released: 02/01/2009 Document Revised: 09/13/2015 Document Reviewed: 05/12/2014 Elsevier Interactive Patient Education  2017 [employer].

## 2021-11-14 ENCOUNTER — Other Ambulatory Visit: Payer: Self-pay | Admitting: Family Medicine

## 2021-11-14 ENCOUNTER — Telehealth: Payer: Self-pay | Admitting: Family Medicine

## 2021-11-14 ENCOUNTER — Encounter: Payer: Self-pay | Admitting: Gastroenterology

## 2021-11-14 ENCOUNTER — Other Ambulatory Visit (HOSPITAL_BASED_OUTPATIENT_CLINIC_OR_DEPARTMENT_OTHER): Payer: Self-pay

## 2021-11-14 ENCOUNTER — Telehealth: Payer: Self-pay

## 2021-11-14 MED ORDER — MOMETASONE FUROATE 50 MCG/ACT NA SUSP
1.0000 | Freq: Every day | NASAL | 1 refills | Status: DC | PRN
  Filled 2021-11-14: qty 51, 90d supply, fill #0
  Filled 2022-02-12: qty 51, 90d supply, fill #1

## 2021-11-14 MED ORDER — IBUPROFEN 800 MG PO TABS
800.0000 mg | ORAL_TABLET | Freq: Three times a day (TID) | ORAL | 3 refills | Status: DC
  Filled 2021-11-14: qty 90, 30d supply, fill #0

## 2021-11-14 NOTE — Telephone Encounter (Signed)
Patient states the nasacort  AQ nasal spray wasn't helping she wants to go back to her generic mometasone. Sent it into [facility].3 month supply

## 2021-11-14 NOTE — Telephone Encounter (Signed)
Pt states she had two medications discontinued and she was unsure why. Did not look like she has gotten either medications from pcp, but she stated she has gotten it for years.   Medication: levothyroxine (SYNTHROID) 88 MCG tablet  Ibuprofen 800 mg   (Both 90 day supply)  Has the patient contacted their pharmacy? No.   Preferred Pharmacy:   [facility]   [address] [place], [place] [address]  Phone:  [phone]  Fax:  [phone]

## 2021-11-15 ENCOUNTER — Other Ambulatory Visit (HOSPITAL_BASED_OUTPATIENT_CLINIC_OR_DEPARTMENT_OTHER): Payer: Self-pay

## 2021-11-15 ENCOUNTER — Other Ambulatory Visit: Payer: Self-pay | Admitting: *Deleted

## 2021-11-15 MED ORDER — AVAPRO 75 MG PO TABS
75.0000 mg | ORAL_TABLET | Freq: Every day | ORAL | 1 refills | Status: DC
  Filled 2021-11-15: qty 90, 90d supply, fill #0
  Filled 2022-02-12: qty 90, 90d supply, fill #1

## 2021-11-15 MED ORDER — LEVOTHYROXINE SODIUM 88 MCG PO TABS
88.0000 ug | ORAL_TABLET | Freq: Every day | ORAL | 0 refills | Status: DC
  Filled 2021-11-15: qty 90, 90d supply, fill #0

## 2021-11-15 MED ORDER — LEVOTHYROXINE SODIUM 88 MCG PO TABS
ORAL_TABLET | ORAL | 1 refills | Status: DC
  Filled 2021-11-15: qty 84, 90d supply, fill #0
  Filled 2022-02-12: qty 84, 90d supply, fill #1
  Filled 2022-05-20: qty 84, 90d supply, fill #2

## 2021-11-15 NOTE — Telephone Encounter (Signed)
Pt notified and medication sent in .  And patient she broke her wrist back in April so she has been taking 800 mg prn , but she will taper down to the OTC 200 mg

## 2021-11-15 NOTE — Telephone Encounter (Signed)
Please advise    Synthroid last filled by previous provider    Ibuprofen 800 sent in 11/14/21 by another provider

## 2021-11-19 ENCOUNTER — Other Ambulatory Visit (HOSPITAL_BASED_OUTPATIENT_CLINIC_OR_DEPARTMENT_OTHER): Payer: Self-pay

## 2021-11-19 DIAGNOSIS — E1140 Type 2 diabetes mellitus with diabetic neuropathy, unspecified: Secondary | ICD-10-CM | POA: Diagnosis not present

## 2021-11-25 ENCOUNTER — Other Ambulatory Visit (HOSPITAL_BASED_OUTPATIENT_CLINIC_OR_DEPARTMENT_OTHER): Payer: Self-pay

## 2021-11-25 ENCOUNTER — Ambulatory Visit (INDEPENDENT_AMBULATORY_CARE_PROVIDER_SITE_OTHER): Payer: Medicare Other | Admitting: Family Medicine

## 2021-11-25 ENCOUNTER — Encounter: Payer: Self-pay | Admitting: Family Medicine

## 2021-11-25 VITALS — BP 124/67 | HR 76 | Ht 64.57 in | Wt 171.6 lb

## 2021-11-25 DIAGNOSIS — R600 Localized edema: Secondary | ICD-10-CM

## 2021-11-25 LAB — CBC
HCT: 37.8 % (ref 36.0–46.0)
Hemoglobin: 12.5 g/dL (ref 12.0–15.0)
MCHC: 33.1 g/dL (ref 30.0–36.0)
MCV: 98.4 fl (ref 78.0–100.0)
Platelets: 291 10*3/uL (ref 150.0–400.0)
RBC: 3.84 Mil/uL — ABNORMAL LOW (ref 3.87–5.11)
RDW: 12.9 % (ref 11.5–15.5)
WBC: 5.5 10*3/uL (ref 4.0–10.5)

## 2021-11-25 LAB — BRAIN NATRIURETIC PEPTIDE: Pro B Natriuretic peptide (BNP): 56 pg/mL (ref 0.0–100.0)

## 2021-11-25 NOTE — Progress Notes (Signed)
   Acute Office Visit  Subjective:     Patient ID: Victoria Walker, female    DOB: [birth year], 71 y.o.   MRN: [id number]  CC: leg swelling   HPI Patient is in today for lower extremity edema.  Patient states that for the past 1-2 months she has noticed some mild, gradual swelling to both of her lower legs. States her feet/legs will look normal in the morning, but then by afternoon/evening they are swollen and feel "tight." She has tried wearing compression socks which has helped somewhat. She denies any skin changes (bruising, redness, rashes), sensation changes, pain, weeping, chest pain, dyspnea, cough, wheezing, GI/GU changes. Reports she typically doesn't eat much sodium, however she injured her wrist several months ago and reports eating out almost daily the entire month of June (because it was difficult to cook with the wrist injury) which is around the time she noticed the swelling start. Reports she takes all medications as prescribed.     ROS All review of systems negative except what is listed in the HPI      Objective:    BP 124/67   Pulse 76   Ht 5' 4.57" (1.64 m)   Wt 171 lb 9.6 oz (77.8 kg)   BMI 28.94 kg/m    Physical Exam Vitals reviewed.  Constitutional:      Appearance: Normal appearance.  HENT:     Head: Normocephalic and atraumatic.  Cardiovascular:     Rate and Rhythm: Normal rate and regular rhythm.     Heart sounds: Murmur heard.  Musculoskeletal:     Cervical back: Normal range of motion and neck supple.     Right lower leg: Edema present.     Left lower leg: Edema present.     Comments: BLE 3+ pitting edema  Skin:    General: Skin is warm and dry.     Findings: No bruising, erythema or rash.  Neurological:     General: No focal deficit present.     Mental Status: She is alert and oriented to person, place, and time. Mental status is at baseline.  Psychiatric:        Mood and Affect: Mood normal.        Behavior: Behavior normal.         Thought Content: Thought content normal.        Judgment: Judgment normal.        No results found for any visits on 11/25/21.      Assessment & Plan:   1. Bilateral lower extremity edema Faint murmur heard, but per chart review this has been present since at least 2017 and noted as benign; no other new symptoms apart from edema. - Labs today. We will update you with results.  - Depending on labs, we will let you know if we need to temporarily increase your diuretic (spironolactone). - Wear compression socks when walking around - Elevate legs as much as possible - Avoid sodium in diet - Monitor for any new symptoms and let us know.  - Recommend 1 week follow-up if not improving.   - CBC - Comprehensive metabolic panel - Brain natriuretic peptide   Return in 1 week (on 12/02/2021), or if symptoms worsen or fail to improve.  Terrilyn Saver, NP

## 2021-11-25 NOTE — Patient Instructions (Addendum)
For your lower extremity edema: - Labs today. We will update you with results.  - Depending on labs, we will let you know if we need to temporarily increase your diuretic (spironolactone). - Wear compression socks when walking around - Elevate legs whenever seated - Avoid sodium in diet - Monitor for any new symptoms and let us know.  - Recommend 1 week follow-up if not improving.

## 2021-11-26 DIAGNOSIS — S52572D Other intraarticular fracture of lower end of left radius, subsequent encounter for closed fracture with routine healing: Secondary | ICD-10-CM | POA: Diagnosis not present

## 2021-11-26 DIAGNOSIS — S52572A Other intraarticular fracture of lower end of left radius, initial encounter for closed fracture: Secondary | ICD-10-CM | POA: Diagnosis not present

## 2021-11-26 LAB — COMPREHENSIVE METABOLIC PANEL
ALT: 16 U/L (ref 0–35)
AST: 17 U/L (ref 0–37)
Albumin: 4.3 g/dL (ref 3.5–5.2)
Alkaline Phosphatase: 51 U/L (ref 39–117)
BUN: 12 mg/dL (ref 6–23)
CO2: 26 mEq/L (ref 19–32)
Calcium: 9.5 mg/dL (ref 8.4–10.5)
Chloride: 104 mEq/L (ref 96–112)
Creatinine, Ser: 1.04 mg/dL (ref 0.40–1.20)
GFR: 53.88 mL/min — ABNORMAL LOW (ref >60.00)
Glucose, Bld: 138 mg/dL — ABNORMAL HIGH (ref 70–99)
Potassium: 4.6 mEq/L (ref 3.5–5.1)
Sodium: 141 mEq/L (ref 135–145)
Total Bilirubin: 0.5 mg/dL (ref 0.2–1.2)
Total Protein: 6.6 g/dL (ref 6.0–8.3)

## 2021-11-28 ENCOUNTER — Encounter: Payer: Self-pay | Admitting: *Deleted

## 2021-11-28 LAB — HM DIABETES EYE EXAM

## 2021-12-02 ENCOUNTER — Other Ambulatory Visit (HOSPITAL_BASED_OUTPATIENT_CLINIC_OR_DEPARTMENT_OTHER): Payer: Self-pay

## 2021-12-02 DIAGNOSIS — M25462 Effusion, left knee: Secondary | ICD-10-CM | POA: Diagnosis not present

## 2021-12-02 DIAGNOSIS — M25562 Pain in left knee: Secondary | ICD-10-CM | POA: Diagnosis not present

## 2021-12-02 DIAGNOSIS — M8580 Other specified disorders of bone density and structure, unspecified site: Secondary | ICD-10-CM | POA: Diagnosis not present

## 2021-12-02 DIAGNOSIS — G8929 Other chronic pain: Secondary | ICD-10-CM | POA: Diagnosis not present

## 2021-12-02 DIAGNOSIS — M1712 Unilateral primary osteoarthritis, left knee: Secondary | ICD-10-CM | POA: Diagnosis not present

## 2021-12-02 DIAGNOSIS — M7652 Patellar tendinitis, left knee: Secondary | ICD-10-CM | POA: Diagnosis not present

## 2021-12-03 ENCOUNTER — Ambulatory Visit (INDEPENDENT_AMBULATORY_CARE_PROVIDER_SITE_OTHER): Payer: Medicare Other | Admitting: Medical

## 2021-12-03 VITALS — BP 140/74 | HR 78 | Temp 98.2°F | Resp 18 | Ht 64.0 in | Wt 167.0 lb

## 2021-12-03 DIAGNOSIS — M25472 Effusion, left ankle: Secondary | ICD-10-CM

## 2021-12-03 DIAGNOSIS — R7989 Other specified abnormal findings of blood chemistry: Secondary | ICD-10-CM | POA: Diagnosis not present

## 2021-12-03 DIAGNOSIS — R944 Abnormal results of kidney function studies: Secondary | ICD-10-CM

## 2021-12-03 DIAGNOSIS — E1140 Type 2 diabetes mellitus with diabetic neuropathy, unspecified: Secondary | ICD-10-CM | POA: Diagnosis not present

## 2021-12-03 LAB — COMPREHENSIVE METABOLIC PANEL
ALT: 17 U/L (ref 0–35)
AST: 15 U/L (ref 0–37)
Albumin: 4.7 g/dL (ref 3.5–5.2)
Alkaline Phosphatase: 52 U/L (ref 39–117)
BUN: 24 mg/dL — ABNORMAL HIGH (ref 6–23)
CO2: 27 mEq/L (ref 19–32)
Calcium: 9.8 mg/dL (ref 8.4–10.5)
Chloride: 99 mEq/L (ref 96–112)
Creatinine, Ser: 0.95 mg/dL (ref 0.40–1.20)
GFR: 60.06 mL/min (ref >60.00)
Glucose, Bld: 106 mg/dL — ABNORMAL HIGH (ref 70–99)
Potassium: 5.1 mEq/L (ref 3.5–5.1)
Sodium: 138 mEq/L (ref 135–145)
Total Bilirubin: 0.4 mg/dL (ref 0.2–1.2)
Total Protein: 7.1 g/dL (ref 6.0–8.3)

## 2021-12-03 LAB — CBC WITH DIFFERENTIAL/PLATELET
Basophils Absolute: 0 10*3/uL (ref 0.0–0.1)
Basophils Relative: 0.4 % (ref 0.0–3.0)
Eosinophils Absolute: 0 10*3/uL (ref 0.0–0.7)
Eosinophils Relative: 0 % (ref 0.0–5.0)
HCT: 38.5 % (ref 36.0–46.0)
Hemoglobin: 12.5 g/dL (ref 12.0–15.0)
Lymphocytes Relative: 19.2 % (ref 12.0–46.0)
Lymphs Abs: 1.4 10*3/uL (ref 0.7–4.0)
MCHC: 32.6 g/dL (ref 30.0–36.0)
MCV: 97.8 fl (ref 78.0–100.0)
Monocytes Absolute: 0.5 10*3/uL (ref 0.1–1.0)
Monocytes Relative: 6.9 % (ref 3.0–12.0)
Neutro Abs: 5.4 10*3/uL (ref 1.4–7.7)
Neutrophils Relative %: 73.5 % (ref 43.0–77.0)
Platelets: 299 10*3/uL (ref 150.0–400.0)
RBC: 3.93 Mil/uL (ref 3.87–5.11)
RDW: 12.8 % (ref 11.5–15.5)
WBC: 7.3 10*3/uL (ref 4.0–10.5)

## 2021-12-03 LAB — HEMOGLOBIN A1C: Hgb A1c MFr Bld: 6.6 % — ABNORMAL HIGH (ref 4.6–6.5)

## 2021-12-03 NOTE — Addendum Note (Signed)
Addended by: Anabel Halon on: 12/03/2021 06:33 PM   Modules accepted: Orders

## 2021-12-03 NOTE — Addendum Note (Signed)
Addended by: Kelle Darting A on: 12/03/2021 11:41 AM   Modules accepted: Orders

## 2021-12-03 NOTE — Progress Notes (Signed)
Subjective:    Patient ID: Victoria Walker, female    DOB: 01/27/1951, 71 y.o.   MRN: [id number]  HPI  Pt in with some recent left ankle swelling over left ankle. No weight gain. No sob or orthopnea. No popliteal pain.  Pt was seen by Victoria Walker. The A/P  Assessment & Plan:    1. Bilateral lower extremity edema Faint murmur heard, but per chart review this has been present since at least 2017 and noted as benign; no other new symptoms apart from edema. - Labs today. We will update you with results.  - Depending on labs, we will let you know if we need to temporarily increase your diuretic (spironolactone). - Wear compression socks when walking around - Elevate legs as much as possible - Avoid sodium in diet - Monitor for any new symptoms and let us know.  - Recommend 1 week follow-up if not improving.    - CBC - Comprehensive metabolic panel - Brain natriuretic peptide  Labs reviewed with pt today.    Review of Systems  Constitutional:  Negative for chills, fatigue and fever.  Respiratory:  Negative for cough, chest tightness and shortness of breath.   Cardiovascular:  Negative for chest pain and palpitations.  Gastrointestinal:  Negative for abdominal pain, diarrhea, rectal pain and vomiting.  Genitourinary:  Negative for dysuria, frequency and pelvic pain.  Musculoskeletal:  Negative for back pain, joint swelling and neck pain.  Skin:  Negative for rash.  Neurological:  Negative for dizziness, light-headedness, numbness and headaches.  Hematological:  Negative for adenopathy. Does not bruise/bleed easily.  Psychiatric/Behavioral:  Negative for behavioral problems, decreased concentration and sleep disturbance. The patient is not nervous/anxious.    Past Medical History:  Diagnosis Date   Arthritis    "both knees" (07/02/2015)   Cancer of right breast (HCC)    Claustrophobia    Dysrhythmia    HX OF FAST HEART RATE AND PALPITATIONS - METOPROLOL HAS HELPED   GERD  (gastroesophageal reflux disease)    H/O hiatal hernia    H/O iritis    LEFT EYE - STATES HER EYE BECOMES RED AND VERY SENSITIVE TO LIGHT WHEN FLARE UP OF IRITIS   Heart murmur    "benign; 06/2015"   High cholesterol    History of blood transfusion    "when I had my partial hysterectomy"   History of chickenpox 09/26/2014   History of measles    History of MRSA infection    History of shingles    Hypertension    Hypothyroidism    Medicare annual wellness visit, initial 04/07/2015   Multinodular thyroid    PT HAVING TROUBLE SWALLOWING   Neuropathy    FEET   Primary localized osteoarthritis of right knee    Sciatica of right side 04/25/2015   Spinal headache    spinal headache with epidural   Total knee replacement status, right    Type II diabetes mellitus (HCC)    ORAL MEDICATION - NO INSULIN   Wears glasses      Social History   Socioeconomic History   Marital status: Married    Spouse name: Not on file   Number of children: 3   Years of education: Not on file   Highest education level: Not on file  Occupational History    Comment: Retired  Tobacco Use   Smoking status: Never   Smokeless tobacco: Never  Vaping Use   Vaping Use: Never used  Substance and Sexual  Activity   Alcohol use: No    Alcohol/week: 0.0 standard drinks of alcohol   Drug use: No   Sexual activity: Not Currently    Birth control/protection: Surgical    Comment: lives with husband, retired from various Walker, diabetic diet  Other Topics Concern   Not on file  Social History Narrative   Not on file   Social Determinants of Health   Financial Resource Strain: Low Risk  (11/07/2020)   Overall Financial Resource Strain (CARDIA)    Difficulty of Paying Living Expenses: Not hard at all  Food Insecurity: No Food Insecurity (11/07/2020)   Hunger Vital Sign    Worried About Running Out of Food in the Last Year: Never true    [place] in the Last Year: Never true  Transportation Needs: No  Transportation Needs (11/07/2020)   PRAPARE - Hydrologist (Medical): No    Lack of Transportation (Non-Medical): No  Physical Activity: Insufficiently Active (11/07/2020)   Exercise Vital Sign    Days of Exercise per Week: 2 days    Minutes of Exercise per Session: 20 min  Stress: No Stress Concern Present (11/07/2020)   House    Feeling of Stress : Not at all  Social Connections: Moderately Integrated (11/07/2020)   Social Connection and Isolation Panel [NHANES]    Frequency of Communication with Friends and Family: More than three times a week    Frequency of Social Gatherings with Friends and Family: More than three times a week    Attends Religious Services: More than 4 times per year    Active Member of [employer] or Organizations: No    Attends Archivist Meetings: Never    Marital Status: Married  Human resources officer Violence: Not At Risk (11/07/2020)   Humiliation, Afraid, Rape, and Kick questionnaire    Fear of Current or Ex-Partner: No    Emotionally Abused: No    Physically Abused: No    Sexually Abused: No    Past Surgical History:  Procedure Laterality Date   [place]  05/11/2012   Procedure: ANTERIOR (CYSTOCELE) AND POSTERIOR REPAIR (RECTOCELE);  Surgeon: Reece Packer, MD;  Location: [facility];  Service: Urology;;  with graft   BREAST BIOPSY Right    CARDIAC CATHETERIZATION  1999   COLONOSCOPY     CYSTOSCOPY WITH URETHRAL DILATATION  05/11/2012   Procedure: CYSTOSCOPY WITH URETHRAL DILATATION;  Surgeon: Reece Packer, MD;  Location: [facility];  Service: Urology;;   ESOPHAGEAL MANOMETRY N/A 10/09/2021   Procedure: ESOPHAGEAL MANOMETRY (EM);  Surgeon: Irving Copas., MD;  Location: [facility];  Service: Gastroenterology;  Laterality: N/A;   ESOPHAGOGASTRODUODENOSCOPY     HERNIA  REPAIR     MASTECTOMY Right 2007   PLANTAR FASCIA SURGERY Left    THYROIDECTOMY N/A 02/23/2014   Procedure: TOTAL THYROIDECTOMY;  Surgeon: Armandina Gemma, MD;  Location: [facility];  Service: General;  Laterality: N/A;   TOTAL KNEE ARTHROPLASTY Right 07/02/2015   TOTAL KNEE ARTHROPLASTY Right 07/02/2015   Procedure: TOTAL KNEE ARTHROPLASTY;  Surgeon: Elsie Saas, MD;  Location: [place];  Service: Orthopedics;  Laterality: Right;   TOTAL THYROIDECTOMY  1976   removed three tumor (2 on right; 1 on left)   [place] PROLAPSE REPAIR  05/11/2012  Procedure: VAGINAL VAULT SUSPENSION;  Surgeon: Reece Packer, MD;  Location: [facility];  Service: Urology;;   VULVA / PERINEUM BIOPSY     vulvar    Family History  Problem Relation Age of Onset   Hypertension Mother    Heart disease Mother        pacer, CHF   Hyperlipidemia Mother    Kidney disease Mother        1 kidney due to stone   Kidney Stones Mother    Hypertension Father    Diabetes Father    Alcohol abuse Father    Lung cancer Father        lung, psa   Diabetes Sister    Hypertension Sister    Breast cancer Sister    Diabetes Brother    Hypertension Brother    Hyperlipidemia Brother    Kidney disease Brother        dialysis   Stroke Brother    Hypertension Maternal Grandmother    Heart disease Maternal Grandmother    Stroke Maternal Grandmother    Hypertension Maternal Grandfather    Hypertension Daughter    Arthritis Son    Hyperlipidemia Sister    Hypertension Sister    Diabetes Sister    Thyroid disease Sister    Kidney disease Sister    Endometriosis Sister    Breast cancer Sister        thyroid and breast   Thyroid cancer Sister    Arthritis Sister        back pain   Hypertension Sister    Thyroid disease Sister    Hypertension Brother    Hyperlipidemia Brother    Hypertension Brother    Hyperlipidemia Brother    Benign prostatic hyperplasia Brother     Hypertension Daughter    Hyperlipidemia Daughter    Diabetes Daughter    Hypertension Maternal Aunt    Hypertension Maternal Uncle    Hypertension Paternal Aunt    Cancer Paternal Aunt    Hypertension Paternal Uncle    Colon cancer Neg Hx    Stomach cancer Neg Hx    Esophageal cancer Neg Hx    Pancreatic cancer Neg Hx    Liver disease Neg Hx    Rectal cancer Neg Hx    Inflammatory bowel disease Neg Hx     Allergies  Allergen Reactions   Codeine Anxiety    Jittery    Penicillins Itching   Sulfa Antibiotics Itching   Vicodin [Hydrocodone-Acetaminophen] Itching    ITCHING     Current Outpatient Medications on File Prior to Visit  Medication Sig Dispense Refill   alendronate (FOSAMAX) 70 MG tablet Take 1 tablet (70 mg total) by mouth once a week. Take with a full glass of water on an empty stomach. 12 tablet 1   aspirin 81 MG EC tablet Take 81 mg by mouth every other day. Swallow whole.     atorvastatin (LIPITOR) 40 MG tablet TAKE 1 TABLET BY MOUTH EVERY OTHER DAY, ALTERNATING WITH 1/2 TABLET 180 tablet 1   AVAPRO 75 MG tablet TAKE 1 TABLET (75 MG TOTAL) BY MOUTH DAILY. 90 tablet 1   Calcium Carbonate-Vitamin D (OS-CAL 500 + D PO)      Carboxymethylcellul-Glycerin (REFRESH OPTIVE OP) Apply to eye.     Cholecalciferol (VITAMIN D) 2000 UNITS CAPS Take 1 capsule by mouth every evening.      Ciclopirox 1 % shampoo apply to affected area(s) as directed twice a week 120  mL 11   cycloSPORINE (RESTASIS) 0.05 % ophthalmic emulsion Place 1 drop into both eyes 2 (two) times daily 180 each 3   diltiazem (TIADYLT ER) 360 MG 24 hr capsule TAKE 1 CAPSULE (360 MG TOTAL) BY MOUTH DAILY. 90 capsule 1   Estradiol 10 MCG TABS vaginal tablet INSERT 1 TAB VAGINALLY AT BEDTIME EVERY OTHER DAY 48 tablet 4   gabapentin (NEURONTIN) 300 MG capsule TAKE 1 CAPSULE (300 MG TOTAL) BY MOUTH AT BEDTIME. 90 capsule 1   glucose blood (ONE TOUCH TEST STRIPS) test strip Use as directed twice daily to check blood  sugar. DX code E11.9 300 each 1   levocetirizine (XYZAL) 5 MG tablet TAKE 1 TABLET (5 MG TOTAL) BY MOUTH EVERY EVENING. 90 tablet 3   levothyroxine (SYNTHROID) 88 MCG tablet TAKE 1 TABLET BY MOUTH ONCE DAILY **SKIP SUNDAY** 90 tablet 1   loteprednol (LOTEMAX) 0.5 % ophthalmic suspension Place one drop into both eyes four times daily with flare up. 10 mL 3   metFORMIN (GLUCOPHAGE-XR) 750 MG 24 hr tablet TAKE 1 TABLET (750 MG TOTAL) BY MOUTH IN THE MORNING AND AT BEDTIME. 180 tablet 1   metoprolol succinate (TOPROL-XL) 25 MG 24 hr tablet TAKE 1 TABLET BY MOUTH EVERY MORNING AND EVERY NIGHT AT BEDTIME 180 tablet 1   mometasone (NASONEX) 50 MCG/ACT nasal spray Place 1-2 sprays in each nostril daily as needed for stuffy nose. 51 g 1   NEXIUM 40 MG capsule TAKE 1 CAPSULE BY MOUTH ONCE DAILY 90 capsule 1   Probiotic Product (PROBIOTIC DAILY PO) Take 1 capsule by mouth daily.     spironolactone (ALDACTONE) 25 MG tablet TAKE 1 & 1/2 TABLETS BY MOUTH DAILY 135 tablet 1   No current facility-administered medications on file prior to visit.    BP (!) 140/74   Pulse 78   Temp 98.2 F (36.8 C)   Resp 18   Ht '5\' 4"'$  (1.626 m)   Wt 167 lb (75.8 kg)   SpO2 100%   BMI 28.67 kg/m         Objective:   Physical Exam   General Mental Status- Alert. General Appearance- Not in acute distress.   Skin General: Color- Normal Color. Moisture- Normal Moisture.  Neck Carotid Arteries- Normal color. Moisture- Normal Moisture. No carotid bruits. No JVD.  Chest and Lung Exam Auscultation: Breath Sounds:-Normal.  Cardiovascular Auscultation:Rythm- Regular. Murmurs & Other Heart Sounds:Auscultation of the heart reveals- No Murmurs.  Abdomen Inspection:-Inspeection Normal. Palpation/Percussion:Note:No mass. Palpation and Percussion of the abdomen reveal- Non Tender, Non Distended + BS, no rebound or guarding.   Neurologic Cranial Nerve exam:- CN III-XII intact(No nystagmus), symmetric smile. Drift  Test:- No drift. Romberg Exam:- Negative.  Heal to Toe Gait exam:-Normal. Finger to Nose:- Normal/Intact Strength:- 5/5 equal and symmetric strength both upper and lower extremities.      Assessment & Plan:   Patient Instructions  Diabetes- check a1c today.  Left ankle swelling(mild pain over talofibular ligament)- use compression socks daily.  Can use ace wrap to left ankle. Elevated legs.  Appear to have dependent/gravity edema but improved from before by your description.  For decreased gfr will repeat cmp today.  For abnormal cbc get repeat cbc.  Follow up date to be determined after lab review.    Mackie Pai, PA-C

## 2021-12-03 NOTE — Patient Instructions (Addendum)
Diabetes- check a1c today.  Left ankle swelling(mild pain over talofibular ligament)- use compression socks daily.  Can use ace wrap to left ankle. Elevated legs.  Appear to have dependent/gravity edema but improved from before by your description.  For decreased gfr will repeat cmp today.  For abnormal cbc get repeat cbc.  Follow up date to be determined after lab review.

## 2021-12-04 DIAGNOSIS — Z1231 Encounter for screening mammogram for malignant neoplasm of breast: Secondary | ICD-10-CM | POA: Diagnosis not present

## 2021-12-04 LAB — HM MAMMOGRAPHY

## 2021-12-05 ENCOUNTER — Ambulatory Visit: Payer: Medicare Other | Admitting: Podiatry

## 2021-12-06 LAB — HM MAMMOGRAPHY

## 2021-12-09 ENCOUNTER — Other Ambulatory Visit (HOSPITAL_BASED_OUTPATIENT_CLINIC_OR_DEPARTMENT_OTHER): Payer: Self-pay

## 2021-12-09 DIAGNOSIS — L218 Other seborrheic dermatitis: Secondary | ICD-10-CM | POA: Diagnosis not present

## 2021-12-09 DIAGNOSIS — L811 Chloasma: Secondary | ICD-10-CM | POA: Diagnosis not present

## 2021-12-09 MED ORDER — CICLOPIROX 1 % EX SHAM
MEDICATED_SHAMPOO | CUTANEOUS | 11 refills | Status: AC
  Filled 2021-12-09: qty 120, 30d supply, fill #0
  Filled 2022-01-10: qty 120, 30d supply, fill #1
  Filled 2022-02-12: qty 120, 30d supply, fill #2
  Filled 2022-03-24: qty 120, 30d supply, fill #3
  Filled 2022-05-02: qty 120, 30d supply, fill #4
  Filled 2022-06-13: qty 120, 30d supply, fill #5
  Filled 2022-08-19 – 2022-08-20 (×2): qty 120, 30d supply, fill #6
  Filled 2022-09-16: qty 120, 30d supply, fill #7
  Filled 2022-10-19: qty 120, 30d supply, fill #8
  Filled 2022-12-01: qty 120, 30d supply, fill #9

## 2021-12-10 ENCOUNTER — Ambulatory Visit (INDEPENDENT_AMBULATORY_CARE_PROVIDER_SITE_OTHER): Payer: Medicare Other | Admitting: Podiatry

## 2021-12-10 DIAGNOSIS — E119 Type 2 diabetes mellitus without complications: Secondary | ICD-10-CM | POA: Diagnosis not present

## 2021-12-10 NOTE — Progress Notes (Signed)
  Subjective:  Patient ID: Victoria Walker, female    DOB: [birth year],  MRN: [id number]  Chief Complaint  Patient presents with   Foot Problem    ROUTINE DIABETIC FOOT CARE     71 y.o. female returns with the above complaint. History confirmed with patient.  Overall doing well.  Neuropathy does not feel worse.  Calluses not bothering her anymore.  She got new shoes that are wider which is helped quite a bit  Objective:  Physical Exam: warm, good capillary refill, no trophic changes or ulcerative lesions, normal DP and PT pulses and somewhat reduced monofilament exam, she is inconsistent in the usual pulps of the toes.  Skin normal turgor and texture no notable lesions    Assessment:   1. Encounter for comprehensive diabetic foot examination, type 2 diabetes mellitus ([place])        Plan:  Patient was evaluated and treated and all questions answered.  Patient educated on diabetes. Discussed proper diabetic foot care and discussed risks and complications of disease. Educated patient in depth on reasons to return to the office immediately should he/she discover anything concerning or new on the feet. All questions answered. Discussed proper shoes as well.     Return in about 1 year (around 12/11/2022) for diabetic foot exam.   Lanae Crumbly, DPM 12/10/2021

## 2021-12-12 ENCOUNTER — Other Ambulatory Visit (HOSPITAL_BASED_OUTPATIENT_CLINIC_OR_DEPARTMENT_OTHER): Payer: Self-pay

## 2021-12-13 ENCOUNTER — Other Ambulatory Visit (HOSPITAL_BASED_OUTPATIENT_CLINIC_OR_DEPARTMENT_OTHER): Payer: Self-pay

## 2021-12-13 ENCOUNTER — Other Ambulatory Visit: Payer: Self-pay | Admitting: Family Medicine

## 2021-12-13 MED ORDER — ALENDRONATE SODIUM 70 MG PO TABS
70.0000 mg | ORAL_TABLET | ORAL | 1 refills | Status: DC
  Filled 2021-12-13 – 2022-02-12 (×2): qty 12, 84d supply, fill #0
  Filled 2022-05-10: qty 12, 84d supply, fill #1

## 2021-12-13 MED ORDER — METFORMIN HCL ER 750 MG PO TB24
750.0000 mg | ORAL_TABLET | Freq: Two times a day (BID) | ORAL | 1 refills | Status: DC
  Filled 2021-12-13: qty 180, 90d supply, fill #0
  Filled 2022-03-19: qty 180, 90d supply, fill #1

## 2021-12-16 ENCOUNTER — Other Ambulatory Visit (HOSPITAL_BASED_OUTPATIENT_CLINIC_OR_DEPARTMENT_OTHER): Payer: Self-pay

## 2021-12-25 ENCOUNTER — Other Ambulatory Visit (HOSPITAL_BASED_OUTPATIENT_CLINIC_OR_DEPARTMENT_OTHER): Payer: Self-pay

## 2021-12-25 ENCOUNTER — Ambulatory Visit
Admission: RE | Admit: 2021-12-25 | Discharge: 2021-12-25 | Disposition: A | Discharge status: Home / Self Care | Payer: Medicare Other | Source: Ambulatory Visit | Attending: Urgent Care | Admitting: Urgent Care

## 2021-12-25 VITALS — BP 148/75 | HR 70 | Temp 98.2°F | Resp 18

## 2021-12-25 DIAGNOSIS — N898 Other specified noninflammatory disorders of vagina: Secondary | ICD-10-CM | POA: Diagnosis present

## 2021-12-25 DIAGNOSIS — N939 Abnormal uterine and vaginal bleeding, unspecified: Secondary | ICD-10-CM | POA: Diagnosis present

## 2021-12-25 DIAGNOSIS — R319 Hematuria, unspecified: Secondary | ICD-10-CM | POA: Insufficient documentation

## 2021-12-25 DIAGNOSIS — R350 Frequency of micturition: Secondary | ICD-10-CM | POA: Diagnosis not present

## 2021-12-25 DIAGNOSIS — I10 Essential (primary) hypertension: Secondary | ICD-10-CM | POA: Diagnosis not present

## 2021-12-25 LAB — POCT URINALYSIS DIP (MANUAL ENTRY)
Bilirubin, UA: NEGATIVE
Glucose, UA: NEGATIVE mg/dL
Ketones, POC UA: NEGATIVE mg/dL
Nitrite, UA: NEGATIVE
Protein Ur, POC: NEGATIVE mg/dL
Spec Grav, UA: 1.015 (ref 1.010–1.025)
Urobilinogen, UA: 0.2 E.U./dL
pH, UA: 6 (ref 5.0–8.0)

## 2021-12-25 MED ORDER — FLUCONAZOLE 150 MG PO TABS
150.0000 mg | ORAL_TABLET | ORAL | 0 refills | Status: DC
  Filled 2021-12-25: qty 2, 14d supply, fill #0

## 2021-12-25 NOTE — ED Provider Notes (Signed)
[facility]  Note:  This document was prepared using Systems analyst and may include unintentional dictation errors.  MRN: [id number] DOB: 08-10-1950  Subjective:   Victoria Walker is a 71 y.o. female presenting for 2-day history of acute onset urinary frequency and urgency.  She is also had some vaginal irritation, itching.  Reports that she thinks it might be related to changing her blood pressure medications.  Normally she does not have a difficult time with it but she has recently gone back and forth between 2 different kinds of medicines.  States that the most recent one really helps her but also has been making her pee a lot.  She noticed that she had vaginal bleeding when she was doing a vaginal swab.  However, she has not noted this at home.  She does get regular follow-up with a gynecologist. Does not hydrate too well.   No current facility-administered medications for this encounter.  Current Outpatient Medications:    alendronate (FOSAMAX) 70 MG tablet, Take 1 tablet (70 mg total) by mouth once a week. Take with a full glass of water on an empty stomach., Disp: 12 tablet, Rfl: 1   aspirin 81 MG EC tablet, Take 81 mg by mouth every other day. Swallow whole., Disp: , Rfl:    atorvastatin (LIPITOR) 40 MG tablet, TAKE 1 TABLET BY MOUTH EVERY OTHER DAY, ALTERNATING WITH 1/2 TABLET, Disp: 180 tablet, Rfl: 1   AVAPRO 75 MG tablet, TAKE 1 TABLET (75 MG TOTAL) BY MOUTH DAILY., Disp: 90 tablet, Rfl: 1   Calcium Carbonate-Vitamin D (OS-CAL 500 + D PO), , Disp: , Rfl:    Carboxymethylcellul-Glycerin (REFRESH OPTIVE OP), Apply to eye., Disp: , Rfl:    Cholecalciferol (VITAMIN D) 2000 UNITS CAPS, Take 1 capsule by mouth every evening. , Disp: , Rfl:    Ciclopirox 1 % shampoo, Apply 1(one) application(s) topically 2(two) times a week, Disp: 120 mL, Rfl: 11   cycloSPORINE (RESTASIS) 0.05 % ophthalmic emulsion, Place 1 drop into both eyes 2 (two) times  daily, Disp: 180 each, Rfl: 3   diltiazem (TIADYLT ER) 360 MG 24 hr capsule, TAKE 1 CAPSULE (360 MG TOTAL) BY MOUTH DAILY., Disp: 90 capsule, Rfl: 1   Estradiol 10 MCG TABS vaginal tablet, INSERT 1 TAB VAGINALLY AT BEDTIME EVERY OTHER DAY, Disp: 48 tablet, Rfl: 4   gabapentin (NEURONTIN) 300 MG capsule, TAKE 1 CAPSULE (300 MG TOTAL) BY MOUTH AT BEDTIME., Disp: 90 capsule, Rfl: 1   glucose blood (ONE TOUCH TEST STRIPS) test strip, Use as directed twice daily to check blood sugar. DX code E11.9, Disp: 300 each, Rfl: 1   levocetirizine (XYZAL) 5 MG tablet, TAKE 1 TABLET (5 MG TOTAL) BY MOUTH EVERY EVENING., Disp: 90 tablet, Rfl: 3   levothyroxine (SYNTHROID) 88 MCG tablet, TAKE 1 TABLET BY MOUTH ONCE DAILY **SKIP SUNDAY**, Disp: 90 tablet, Rfl: 1   loteprednol (LOTEMAX) 0.5 % ophthalmic suspension, Place one drop into both eyes four times daily with flare up., Disp: 10 mL, Rfl: 3   metFORMIN (GLUCOPHAGE-XR) 750 MG 24 hr tablet, Take 1 tablet (750 mg total) by mouth in the morning and at bedtime., Disp: 180 tablet, Rfl: 1   metoprolol succinate (TOPROL-XL) 25 MG 24 hr tablet, TAKE 1 TABLET BY MOUTH EVERY MORNING AND EVERY NIGHT AT BEDTIME, Disp: 180 tablet, Rfl: 1   mometasone (NASONEX) 50 MCG/ACT nasal spray, Place 1-2 sprays in each nostril daily as needed for stuffy nose.,  Disp: 51 g, Rfl: 1   NEXIUM 40 MG capsule, TAKE 1 CAPSULE BY MOUTH ONCE DAILY, Disp: 90 capsule, Rfl: 1   Probiotic Product (PROBIOTIC DAILY PO), Take 1 capsule by mouth daily., Disp: , Rfl:    spironolactone (ALDACTONE) 25 MG tablet, TAKE 1 & 1/2 TABLETS BY MOUTH DAILY, Disp: 135 tablet, Rfl: 1   Allergies  Allergen Reactions   Codeine Anxiety    Jittery    Penicillins Itching   Sulfa Antibiotics Itching   Vicodin [Hydrocodone-Acetaminophen] Itching    ITCHING     Past Medical History:  Diagnosis Date   Arthritis    "both knees" (07/02/2015)   Cancer of right breast (HCC)    Claustrophobia    Dysrhythmia    HX OF  FAST HEART RATE AND PALPITATIONS - METOPROLOL HAS HELPED   GERD (gastroesophageal reflux disease)    H/O hiatal hernia    H/O iritis    LEFT EYE - STATES HER EYE BECOMES RED AND VERY SENSITIVE TO LIGHT WHEN FLARE UP OF IRITIS   Heart murmur    "benign; 06/2015"   High cholesterol    History of blood transfusion    "when I had my partial hysterectomy"   History of chickenpox 09/26/2014   History of measles    History of MRSA infection    History of shingles    Hypertension    Hypothyroidism    Medicare annual wellness visit, initial 04/07/2015   Multinodular thyroid    PT HAVING TROUBLE SWALLOWING   Neuropathy    FEET   Primary localized osteoarthritis of right knee    Sciatica of right side 04/25/2015   Spinal headache    spinal headache with epidural   Total knee replacement status, right    Type II diabetes mellitus ([place])    ORAL MEDICATION - NO INSULIN   Wears glasses      Past Surgical History:  Procedure Laterality Date   ABDOMINAL HYSTERECTOMY  1988   ABDOMINOPLASTY  1986   ANTERIOR AND POSTERIOR REPAIR  05/11/2012   Procedure: ANTERIOR (CYSTOCELE) AND POSTERIOR REPAIR (RECTOCELE);  Surgeon: Reece Packer, MD;  Location: [facility];  Service: Urology;;  with graft   BREAST BIOPSY Right    CARDIAC CATHETERIZATION  1999   COLONOSCOPY     CYSTOSCOPY WITH URETHRAL DILATATION  05/11/2012   Procedure: CYSTOSCOPY WITH URETHRAL DILATATION;  Surgeon: Reece Packer, MD;  Location: [facility];  Service: Urology;;   ESOPHAGEAL MANOMETRY N/A 10/09/2021   Procedure: ESOPHAGEAL MANOMETRY (EM);  Surgeon: Irving Copas., MD;  Location: [facility];  Service: Gastroenterology;  Laterality: N/A;   ESOPHAGOGASTRODUODENOSCOPY     HERNIA REPAIR     MASTECTOMY Right 2007   PLANTAR FASCIA SURGERY Left    THYROIDECTOMY N/A 02/23/2014   Procedure: TOTAL THYROIDECTOMY;  Surgeon: Armandina Gemma, MD;  Location: [facility];  Service: General;  Laterality: N/A;   TOTAL KNEE ARTHROPLASTY Right  07/02/2015   TOTAL KNEE ARTHROPLASTY Right 07/02/2015   Procedure: TOTAL KNEE ARTHROPLASTY;  Surgeon: Elsie Saas, MD;  Location: [facility];  Service: Orthopedics;  Laterality: Right;   TOTAL THYROIDECTOMY  1976   removed three tumor (2 on right; 1 on left)   TUBAL LIGATION     UMBILICAL HERNIA REPAIR  1986   VAGINAL PROLAPSE REPAIR  05/11/2012   Procedure: VAGINAL VAULT SUSPENSION;  Surgeon: Reece Packer, MD;  Location: [facility];  Service: Urology;;   VULVA / PERINEUM BIOPSY  vulvar    Family History  Problem Relation Age of Onset   Hypertension Mother    Heart disease Mother        pacer, CHF   Hyperlipidemia Mother    Kidney disease Mother        1 kidney due to stone   Kidney Stones Mother    Hypertension Father    Diabetes Father    Alcohol abuse Father    Lung cancer Father        lung, psa   Diabetes Sister    Hypertension Sister    Breast cancer Sister    Diabetes Brother    Hypertension Brother    Hyperlipidemia Brother    Kidney disease Brother        dialysis   Stroke Brother    Hypertension Maternal Grandmother    Heart disease Maternal Grandmother    Stroke Maternal Grandmother    Hypertension Maternal Grandfather    Hypertension Daughter    Arthritis Son    Hyperlipidemia Sister    Hypertension Sister    Diabetes Sister    Thyroid disease Sister    Kidney disease Sister    Endometriosis Sister    Breast cancer Sister        thyroid and breast   Thyroid cancer Sister    Arthritis Sister        back pain   Hypertension Sister    Thyroid disease Sister    Hypertension Brother    Hyperlipidemia Brother    Hypertension Brother    Hyperlipidemia Brother    Benign prostatic hyperplasia Brother    Hypertension Daughter    Hyperlipidemia Daughter    Diabetes Daughter    Hypertension Maternal Aunt    Hypertension Maternal Uncle    Hypertension Paternal Aunt    Cancer Paternal Aunt    Hypertension Paternal Uncle    Colon cancer Neg Hx     Stomach cancer Neg Hx    Esophageal cancer Neg Hx    Pancreatic cancer Neg Hx    Liver disease Neg Hx    Rectal cancer Neg Hx    Inflammatory bowel disease Neg Hx     Social History   Tobacco Use   Smoking status: Never   Smokeless tobacco: Never  Vaping Use   Vaping Use: Never used  Substance Use Topics   Alcohol use: No    Alcohol/week: 0.0 standard drinks of alcohol   Drug use: No    ROS   Objective:   Vitals: BP (!) 148/75   Pulse 70   Temp 98.2 F (36.8 C)   Resp 18   SpO2 98%   Physical Exam Constitutional:      General: She is not in acute distress.    Appearance: Normal appearance. She is well-developed. She is not ill-appearing, toxic-appearing or diaphoretic.  HENT:     Head: Normocephalic and atraumatic.     Nose: Nose normal.     Mouth/Throat:     Mouth: Mucous membranes are moist.     Pharynx: Oropharynx is clear.  Eyes:     General: No scleral icterus.       Right eye: No discharge.        Left eye: No discharge.     Extraocular Movements: Extraocular movements intact.     Conjunctiva/sclera: Conjunctivae normal.  Cardiovascular:     Rate and Rhythm: Normal rate.  Pulmonary:     Effort: Pulmonary effort is normal.  Abdominal:  General: Bowel sounds are normal. There is no distension.     Palpations: Abdomen is soft. There is no mass.     Tenderness: There is no abdominal tenderness. There is no right CVA tenderness, left CVA tenderness, guarding or rebound.  Skin:    General: Skin is warm and dry.  Neurological:     General: No focal deficit present.     Mental Status: She is alert and oriented to person, place, and time.  Psychiatric:        Mood and Affect: Mood normal.        Behavior: Behavior normal.        Thought Content: Thought content normal.        Judgment: Judgment normal.     Results for orders placed or performed during the hospital encounter of 12/25/21 (from the past 24 hour(s))  POCT urinalysis dipstick      Status: Abnormal   Collection Time: 12/25/21  4:59 PM  Result Value Ref Range   Color, UA yellow yellow   Clarity, UA clear clear   Glucose, UA negative negative mg/dL   Bilirubin, UA negative negative   Ketones, POC UA negative negative mg/dL   Spec Grav, UA 1.015 1.010 - 1.025   Blood, UA moderate (A) negative   pH, UA 6.0 5.0 - 8.0   Protein Ur, POC negative negative mg/dL   Urobilinogen, UA 0.2 0.2 or 1.0 E.U./dL   Nitrite, UA Negative Negative   Leukocytes, UA Trace (A) Negative    Assessment and Plan :   PDMP not reviewed this encounter.  1. Vaginal itching   2. Hematuria, unspecified type   3. Vaginal bleeding   4. Urinary frequency   5. Essential hypertension     No alarming physical exam findings.  Recommended holding off on antibiotics for an urinary tract infection given the urinalysis.  Urine culture pending.  We will treat empirically for yeast vaginitis given the discharge and itching. Labs pending.  Given the vaginal bleeding, recommended that she follow-up with her gynecologist soon as possible. Counseled patient on potential for adverse effects with medications prescribed/recommended today, ER and return-to-clinic precautions discussed, patient verbalized understanding.    Jaynee Eagles, PA-C 12/25/21 1728

## 2021-12-25 NOTE — Discharge Instructions (Signed)
Please start fluconazole to address a vaginal yeast infection. Make sure you hydrate very well with plain water and a quantity of 64 ounces of water a day.  Please limit drinks that are considered urinary irritants such as soda, sweet tea, coffee, energy drinks, alcohol.  These can worsen your urinary and genital symptoms but also be the source of them.  I will let you know about your urine culture results through MyChart to see if we need to prescribe or change your antibiotics based off of those results.

## 2021-12-25 NOTE — ED Triage Notes (Signed)
Pt here with vaginal irritation and burning while urinating x 2 days.

## 2021-12-26 LAB — CERVICOVAGINAL ANCILLARY ONLY
Bacterial Vaginitis (gardnerella): NEGATIVE
Candida Glabrata: NEGATIVE
Candida Vaginitis: NEGATIVE
Comment: NEGATIVE
Comment: NEGATIVE
Comment: NEGATIVE

## 2021-12-27 ENCOUNTER — Other Ambulatory Visit (HOSPITAL_BASED_OUTPATIENT_CLINIC_OR_DEPARTMENT_OTHER): Payer: Self-pay

## 2021-12-27 ENCOUNTER — Telehealth (HOSPITAL_COMMUNITY): Payer: Self-pay | Admitting: Emergency Medicine

## 2021-12-27 LAB — URINE CULTURE: Culture: 50000 — AB

## 2021-12-27 MED ORDER — CEPHALEXIN 500 MG PO CAPS
500.0000 mg | ORAL_CAPSULE | Freq: Two times a day (BID) | ORAL | 0 refills | Status: AC
  Filled 2021-12-27: qty 14, 7d supply, fill #0

## 2021-12-30 ENCOUNTER — Ambulatory Visit: Payer: Medicare Other | Admitting: Obstetrics & Gynecology

## 2022-01-08 ENCOUNTER — Encounter: Payer: Self-pay | Admitting: Gastroenterology

## 2022-01-08 ENCOUNTER — Other Ambulatory Visit: Payer: Medicare Other

## 2022-01-08 ENCOUNTER — Ambulatory Visit (INDEPENDENT_AMBULATORY_CARE_PROVIDER_SITE_OTHER): Payer: Medicare Other | Admitting: Gastroenterology

## 2022-01-08 VITALS — BP 100/58 | HR 80 | Ht 64.5 in | Wt 161.1 lb

## 2022-01-08 DIAGNOSIS — R1319 Other dysphagia: Secondary | ICD-10-CM | POA: Diagnosis not present

## 2022-01-08 DIAGNOSIS — K219 Gastro-esophageal reflux disease without esophagitis: Secondary | ICD-10-CM

## 2022-01-08 DIAGNOSIS — Z8619 Personal history of other infectious and parasitic diseases: Secondary | ICD-10-CM | POA: Diagnosis not present

## 2022-01-08 DIAGNOSIS — R1310 Dysphagia, unspecified: Secondary | ICD-10-CM

## 2022-01-08 NOTE — Assessment & Plan Note (Signed)
Well controlled, no changes to meds. Encouraged heart healthy diet such as the DASH diet and exercise as tolerated.  °

## 2022-01-08 NOTE — Assessment & Plan Note (Signed)
On Levothyroxine, continue to monitor 

## 2022-01-08 NOTE — Patient Instructions (Addendum)
If you are age 72 or older, your body mass index should be between 23-30. Your Body mass index is 27.23 kg/m. If this is out of the aforementioned range listed, please consider follow up with your Primary Care Provider. ________________________________________________________  The [facility] providers would like to encourage you to use [facility] to communicate with providers for non-urgent requests or questions.  Due to long hold times on the telephone, sending your provider a message by [facility] may be a faster and more efficient way to get a response.  Please allow 48 business hours for a response.  Please remember that this is for non-urgent requests.  _______________________________________________________  Your provider has requested that you go to the basement level for lab work before leaving today. Press "B" on the elevator. The lab is located at the first door on the left as you exit the elevator.  Hold Nexium for 1 week then you will turn in the stool sample to our basement.  You will need a follow up appointment in 6 months (March 2024).  We will contact you with this appointment.  Thank you for entrusting me with your care and choosing [facility].  Dr Rush Landmark

## 2022-01-08 NOTE — Assessment & Plan Note (Addendum)
Encouraged to get adequate exercise, calcium and vitamin d intake. Dexa scan this year showed progression to osteoporosis. She has been started on Fosamax weekly

## 2022-01-08 NOTE — Assessment & Plan Note (Signed)
hgba1c acceptable, minimize simple carbs. Increase exercise as tolerated. Continue current meds 

## 2022-01-09 ENCOUNTER — Telehealth: Payer: Self-pay | Admitting: *Deleted

## 2022-01-09 ENCOUNTER — Other Ambulatory Visit: Payer: Self-pay | Admitting: Family Medicine

## 2022-01-09 ENCOUNTER — Encounter: Payer: Self-pay | Admitting: Family Medicine

## 2022-01-09 ENCOUNTER — Ambulatory Visit (INDEPENDENT_AMBULATORY_CARE_PROVIDER_SITE_OTHER): Payer: Medicare Other | Admitting: Family Medicine

## 2022-01-09 ENCOUNTER — Other Ambulatory Visit (HOSPITAL_BASED_OUTPATIENT_CLINIC_OR_DEPARTMENT_OTHER): Payer: Self-pay

## 2022-01-09 VITALS — BP 115/60 | HR 65 | Temp 98.7°F | Resp 16 | Ht 64.0 in | Wt 161.4 lb

## 2022-01-09 DIAGNOSIS — S62102S Fracture of unspecified carpal bone, left wrist, sequela: Secondary | ICD-10-CM | POA: Diagnosis not present

## 2022-01-09 DIAGNOSIS — M810 Age-related osteoporosis without current pathological fracture: Secondary | ICD-10-CM | POA: Diagnosis not present

## 2022-01-09 DIAGNOSIS — M79605 Pain in left leg: Secondary | ICD-10-CM

## 2022-01-09 DIAGNOSIS — I10 Essential (primary) hypertension: Secondary | ICD-10-CM | POA: Diagnosis not present

## 2022-01-09 DIAGNOSIS — R300 Dysuria: Secondary | ICD-10-CM

## 2022-01-09 DIAGNOSIS — N289 Disorder of kidney and ureter, unspecified: Secondary | ICD-10-CM | POA: Diagnosis not present

## 2022-01-09 DIAGNOSIS — E039 Hypothyroidism, unspecified: Secondary | ICD-10-CM

## 2022-01-09 DIAGNOSIS — E1140 Type 2 diabetes mellitus with diabetic neuropathy, unspecified: Secondary | ICD-10-CM | POA: Diagnosis not present

## 2022-01-09 DIAGNOSIS — M545 Low back pain, unspecified: Secondary | ICD-10-CM | POA: Diagnosis not present

## 2022-01-09 DIAGNOSIS — Z23 Encounter for immunization: Secondary | ICD-10-CM | POA: Diagnosis not present

## 2022-01-09 DIAGNOSIS — R1319 Other dysphagia: Secondary | ICD-10-CM

## 2022-01-09 LAB — URINALYSIS, ROUTINE W REFLEX MICROSCOPIC
Bilirubin Urine: NEGATIVE
Ketones, ur: NEGATIVE
Nitrite: NEGATIVE
Specific Gravity, Urine: 1.01 (ref 1.000–1.030)
Urine Glucose: NEGATIVE
Urobilinogen, UA: 0.2 (ref 0.0–1.0)
pH: 6.5 (ref 5.0–8.0)

## 2022-01-09 LAB — CBC
HCT: 38.7 % (ref 36.0–46.0)
Hemoglobin: 12.7 g/dL (ref 12.0–15.0)
MCHC: 32.8 g/dL (ref 30.0–36.0)
MCV: 98.3 fl (ref 78.0–100.0)
Platelets: 278 10*3/uL (ref 150.0–400.0)
RBC: 3.94 Mil/uL (ref 3.87–5.11)
RDW: 13.2 % (ref 11.5–15.5)
WBC: 7.2 10*3/uL (ref 4.0–10.5)

## 2022-01-09 LAB — COMPREHENSIVE METABOLIC PANEL
ALT: 15 U/L (ref 0–35)
AST: 13 U/L (ref 0–37)
Albumin: 4 g/dL (ref 3.5–5.2)
Alkaline Phosphatase: 47 U/L (ref 39–117)
BUN: 15 mg/dL (ref 6–23)
CO2: 31 mEq/L (ref 19–32)
Calcium: 9.1 mg/dL (ref 8.4–10.5)
Chloride: 102 mEq/L (ref 96–112)
Creatinine, Ser: 0.99 mg/dL (ref 0.40–1.20)
GFR: 57.12 mL/min — ABNORMAL LOW (ref >60.00)
Glucose, Bld: 90 mg/dL (ref 70–99)
Potassium: 4.7 mEq/L (ref 3.5–5.1)
Sodium: 138 mEq/L (ref 135–145)
Total Bilirubin: 0.3 mg/dL (ref 0.2–1.2)
Total Protein: 6.4 g/dL (ref 6.0–8.3)

## 2022-01-09 LAB — POC URINALSYSI DIPSTICK (AUTOMATED)
Glucose, UA: NEGATIVE
Nitrite, UA: NEGATIVE
Protein, UA: POSITIVE — AB
Spec Grav, UA: 1.01 (ref 1.010–1.025)
Urobilinogen, UA: 0.2 E.U./dL
pH, UA: 6 (ref 5.0–8.0)

## 2022-01-09 LAB — TSH: TSH: 0.83 u[IU]/mL (ref 0.35–5.50)

## 2022-01-09 MED ORDER — METOPROLOL SUCCINATE ER 25 MG PO TB24
25.0000 mg | ORAL_TABLET | Freq: Every day | ORAL | 1 refills | Status: DC
  Filled 2022-01-09: qty 90, 90d supply, fill #0

## 2022-01-09 MED ORDER — METOPROLOL SUCCINATE ER 25 MG PO TB24
25.0000 mg | ORAL_TABLET | Freq: Two times a day (BID) | ORAL | 1 refills | Status: DC
  Filled 2022-01-09 – 2022-02-12 (×3): qty 180, 90d supply, fill #0
  Filled 2022-05-20: qty 180, 90d supply, fill #1

## 2022-01-09 NOTE — Assessment & Plan Note (Signed)
She has just finished antibiotics and she is having Dysuria again. Check UA and culture

## 2022-01-09 NOTE — Assessment & Plan Note (Signed)
Swallow study was good so no dilatation required. She is feeling well, no trouble with eating at present.

## 2022-01-09 NOTE — Telephone Encounter (Signed)
Pt states Metoprolol was reduced to once daily due to her blood pressure running a little low.  Pt states she has only had one dose today and she has had a lot of burping (which she experienced before when she tried to reduce medication). She also notices palpitations this afternoon without her second dose. Pt is wanting to resume twice daily dose.  Please advise?
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
## 2022-01-09 NOTE — Progress Notes (Signed)
Subjective:   By signing my name below, I, Kellie Simmering, attest that this documentation has been prepared under the direction and in the presence of Mosie Lukes, MD 01/09/2022.     Patient ID: Victoria Walker, female    DOB: 1950-12-11, 71 y.o.   MRN: [id number]  Chief Complaint  Patient presents with   Follow-up    Here for follow up   HPI Patient is in today for an office visit.  Blood pressure: Her blood pressure is within normal range today. However, she reports that her blood pressure has been low at home, with a recent reading of 100/58. She states that last night she took 0.5 of Avapro 75 mg, 0.5 of Metoprolol 25 mg, and 1 Spironolactone 25 mg. She further states that she takes Alendronate 70 mg on Sundays.  BP Readings from Last 3 Encounters:  01/09/22 115/60  01/08/22 (!) 100/58  12/25/21 (!) 148/75   Immunizations: She has been informed about receiving COVID-19, high-dose Flu, and RSV immunizations. She will receive the high-dose Flu immunization today.   Fall: She reports that she fell over a piece of wood in 07/2021 and broke her left radius. She further reports that her left ankle and leg were swollen for 3 months after the fall. She was taking Tylenol to manage the inflammation but experienced changes in her kidney function from this.   Gastroenterology: She saw her gastroenterologist, Dr. Rush Landmark, yesterday on 01/08/2022. She reports that she was experiencing swallowing difficulties, but this has slightly subsided. She states that she will perform a colonoscopy in 01/2023.  Vaginal itching: She visited the ER on 12/25/2021 due to vaginal itching. She reports that she was experiencing back pain and urinary burning. She is requesting a urine culture today.   Past Medical History:  Diagnosis Date   Arthritis    "both knees" (07/02/2015)   Cancer of right breast ([place])    Claustrophobia    Dysrhythmia    HX OF FAST HEART RATE AND PALPITATIONS - METOPROLOL HAS  HELPED   GERD (gastroesophageal reflux disease)    H/O hiatal hernia    H/O iritis    LEFT EYE - STATES HER EYE BECOMES RED AND VERY SENSITIVE TO LIGHT WHEN FLARE UP OF IRITIS   Heart murmur    "benign; 06/2015"   High cholesterol    History of blood transfusion    "when I had my partial hysterectomy"   History of chickenpox 09/26/2014   History of measles    History of MRSA infection    History of shingles    Hypertension    Hypothyroidism    Medicare annual wellness visit, initial 04/07/2015   Multinodular thyroid    PT HAVING TROUBLE SWALLOWING   Neuropathy    FEET   Primary localized osteoarthritis of right knee    Sciatica of right side 04/25/2015   Spinal headache    spinal headache with epidural   Total knee replacement status, right    Type II diabetes mellitus ([place])    ORAL MEDICATION - NO INSULIN   Wears glasses    Past Surgical History:  Procedure Laterality Date   ABDOMINAL HYSTERECTOMY  1988   ABDOMINOPLASTY  1986   ANTERIOR AND POSTERIOR REPAIR  05/11/2012   Procedure: ANTERIOR (CYSTOCELE) AND POSTERIOR REPAIR (RECTOCELE);  Surgeon: Reece Packer, MD;  Location: [facility];  Service: Urology;;  with graft   BREAST BIOPSY Right    CARDIAC CATHETERIZATION  1999   COLONOSCOPY  CYSTOSCOPY WITH URETHRAL DILATATION  05/11/2012   Procedure: CYSTOSCOPY WITH URETHRAL DILATATION;  Surgeon: Reece Packer, MD;  Location: [facility];  Service: Urology;;   ESOPHAGEAL MANOMETRY N/A 10/09/2021   Procedure: ESOPHAGEAL MANOMETRY (EM);  Surgeon: Irving Copas., MD;  Location: [facility];  Service: Gastroenterology;  Laterality: N/A;   ESOPHAGOGASTRODUODENOSCOPY     HERNIA REPAIR     MASTECTOMY Right 2007   PLANTAR FASCIA SURGERY Left    THYROIDECTOMY N/A 02/23/2014   Procedure: TOTAL THYROIDECTOMY;  Surgeon: Armandina Gemma, MD;  Location: [facility];  Service: General;  Laterality: N/A;   TOTAL KNEE ARTHROPLASTY Right 07/02/2015   TOTAL KNEE ARTHROPLASTY Right 07/02/2015    Procedure: TOTAL KNEE ARTHROPLASTY;  Surgeon: Elsie Saas, MD;  Location: [facility];  Service: Orthopedics;  Laterality: Right;   TOTAL THYROIDECTOMY  1976   removed three tumor (2 on right; 1 on left)   TUBAL LIGATION     UMBILICAL HERNIA REPAIR  1986   VAGINAL PROLAPSE REPAIR  05/11/2012   Procedure: VAGINAL VAULT SUSPENSION;  Surgeon: Reece Packer, MD;  Location: [facility];  Service: Urology;;   VULVA / PERINEUM BIOPSY     vulvar   Family History  Problem Relation Age of Onset   Hypertension Mother    Heart disease Mother        pacer, CHF   Hyperlipidemia Mother    Kidney disease Mother        1 kidney due to stone   Kidney Stones Mother    Hypertension Father    Diabetes Father    Alcohol abuse Father    Lung cancer Father        lung, psa   Diabetes Sister    Hypertension Sister    Breast cancer Sister    Diabetes Brother    Hypertension Brother    Hyperlipidemia Brother    Kidney disease Brother        dialysis   Stroke Brother    Hypertension Maternal Grandmother    Heart disease Maternal Grandmother    Stroke Maternal Grandmother    Hypertension Maternal Grandfather    Hypertension Daughter    Arthritis Son    Hyperlipidemia Sister    Hypertension Sister    Diabetes Sister    Thyroid disease Sister    Kidney disease Sister    Endometriosis Sister    Breast cancer Sister        thyroid and breast   Thyroid cancer Sister    Arthritis Sister        back pain   Hypertension Sister    Thyroid disease Sister    Hypertension Brother    Hyperlipidemia Brother    Hypertension Brother    Hyperlipidemia Brother    Benign prostatic hyperplasia Brother    Hypertension Daughter    Hyperlipidemia Daughter    Diabetes Daughter    Hypertension Maternal Aunt    Hypertension Maternal Uncle    Hypertension Paternal Aunt    Cancer Paternal Aunt    Hypertension Paternal Uncle    Colon cancer Neg Hx    Stomach cancer Neg Hx    Esophageal cancer Neg Hx     Pancreatic cancer Neg Hx    Liver disease Neg Hx    Rectal cancer Neg Hx    Inflammatory bowel disease Neg Hx    Social History   Socioeconomic History   Marital status: Married    Spouse name: Not on file   Number  of children: 3   Years of education: Not on file   Highest education level: Not on file  Occupational History    Comment: Retired  Tobacco Use   Smoking status: Never   Smokeless tobacco: Never  Vaping Use   Vaping Use: Never used  Substance and Sexual Activity   Alcohol use: No    Alcohol/week: 0.0 standard drinks of alcohol   Drug use: No   Sexual activity: Not Currently    Birth control/protection: Surgical    Comment: lives with husband, retired from various jobs, diabetic diet  Other Topics Concern   Not on file  Social History Narrative   Not on file   Social Determinants of Health   Financial Resource Strain: [place]  (11/07/2020)   Overall Financial Resource Strain (CARDIA)    Difficulty of Paying Living Expenses: Not hard at all  Food Insecurity: No [place] (11/07/2020)   Hunger Vital Sign    Worried About Running Out of Food in the Last Year: Never true    [place] in the Last Year: Never true  Transportation Needs: No Transportation Needs (11/07/2020)   PRAPARE - Hydrologist (Medical): No    Lack of Transportation (Non-Medical): No  Physical Activity: Insufficiently Active (11/07/2020)   Exercise Vital Sign    Days of Exercise per Week: 2 days    Minutes of Exercise per Session: 20 min  Stress: No Stress Concern Present (11/07/2020)   [place]    Feeling of Stress : Not at all  Social Connections: Moderately Integrated (11/07/2020)   Social Connection and Isolation Panel [NHANES]    Frequency of Communication with Friends and Family: More than three times a week    Frequency of Social Gatherings with Friends and Family: More than three  times a week    Attends Religious Services: More than 4 times per year    Active Member of [employer] or Organizations: No    Attends Archivist Meetings: Never    Marital Status: Married  Human resources officer Violence: Not At Risk (11/07/2020)   Humiliation, Afraid, Rape, and Kick questionnaire    Fear of Current or Ex-Partner: No    Emotionally Abused: No    Physically Abused: No    Sexually Abused: No   Outpatient Medications Prior to Visit  Medication Sig Dispense Refill   alendronate (FOSAMAX) 70 MG tablet Take 1 tablet (70 mg total) by mouth once a week. Take with a full glass of water on an empty stomach. 12 tablet 1   aspirin 81 MG EC tablet Take 81 mg by mouth every other day. Swallow whole.     atorvastatin (LIPITOR) 40 MG tablet TAKE 1 TABLET BY MOUTH EVERY OTHER DAY, ALTERNATING WITH 1/2 TABLET 180 tablet 1   AVAPRO 75 MG tablet TAKE 1 TABLET (75 MG TOTAL) BY MOUTH DAILY. 90 tablet 1   Calcium Carbonate-Vitamin D (OS-CAL 500 + D PO)      Calcium Polycarbophil (FIBER) 625 MG TABS Take 1 tablet by mouth daily.     Carboxymethylcellul-Glycerin (REFRESH OPTIVE OP) Apply to eye.     Cholecalciferol (VITAMIN D) 2000 UNITS CAPS Take 1 capsule by mouth every evening.      Ciclopirox 1 % shampoo Apply 1(one) application(s) topically 2(two) times a week 120 mL 11   cycloSPORINE (RESTASIS) 0.05 % ophthalmic emulsion Place 1 drop into both eyes 2 (two) times  daily 180 each 3   diltiazem (TIADYLT ER) 360 MG 24 hr capsule TAKE 1 CAPSULE (360 MG TOTAL) BY MOUTH DAILY. 90 capsule 1   Estradiol 10 MCG TABS vaginal tablet INSERT 1 TAB VAGINALLY AT BEDTIME EVERY OTHER DAY 48 tablet 4   gabapentin (NEURONTIN) 300 MG capsule TAKE 1 CAPSULE (300 MG TOTAL) BY MOUTH AT BEDTIME. 90 capsule 1   glucose blood (ONE TOUCH TEST STRIPS) test strip Use as directed twice daily to check blood sugar. DX code E11.9 300 each 1   levocetirizine (XYZAL) 5 MG tablet TAKE 1 TABLET (5 MG TOTAL) BY MOUTH EVERY EVENING.  90 tablet 3   levothyroxine (SYNTHROID) 88 MCG tablet TAKE 1 TABLET BY MOUTH ONCE DAILY **SKIP SUNDAY** 90 tablet 1   loteprednol (LOTEMAX) 0.5 % ophthalmic suspension Place one drop into both eyes four times daily with flare up. 10 mL 3   metFORMIN (GLUCOPHAGE-XR) 750 MG 24 hr tablet Take 1 tablet (750 mg total) by mouth in the morning and at bedtime. 180 tablet 1   mometasone (NASONEX) 50 MCG/ACT nasal spray Place 1-2 sprays in each nostril daily as needed for stuffy nose. 51 g 1   NEXIUM 40 MG capsule TAKE 1 CAPSULE BY MOUTH ONCE DAILY 90 capsule 1   Probiotic Product (PROBIOTIC DAILY PO) Take 1 capsule by mouth daily.     spironolactone (ALDACTONE) 25 MG tablet TAKE 1 & 1/2 TABLETS BY MOUTH DAILY 135 tablet 1   metoprolol succinate (TOPROL-XL) 25 MG 24 hr tablet TAKE 1 TABLET BY MOUTH EVERY MORNING AND EVERY NIGHT AT BEDTIME 180 tablet 1   No facility-administered medications prior to visit.   Allergies  Allergen Reactions   Codeine Anxiety    Jittery    Penicillins Itching   Sulfa Antibiotics Itching   Vicodin [Hydrocodone-Acetaminophen] Itching    ITCHING    ROS     Objective:    Physical Exam Constitutional:      General: She is not in acute distress.    Appearance: Normal appearance. She is not ill-appearing.  HENT:     Head: Normocephalic and atraumatic.     Right Ear: External ear normal.     Left Ear: External ear normal.     Mouth/Throat:     Mouth: Mucous membranes are dry.     Pharynx: Oropharynx is clear.  Eyes:     Extraocular Movements: Extraocular movements intact.     Pupils: Pupils are equal, round, and reactive to light.  Cardiovascular:     Rate and Rhythm: Normal rate and regular rhythm.     Pulses: Normal pulses.     Heart sounds: Normal heart sounds. No murmur heard.    No gallop.  Pulmonary:     Effort: Pulmonary effort is normal. No respiratory distress.     Breath sounds: Normal breath sounds. No wheezing or rales.  Abdominal:     General:  Bowel sounds are normal.  Skin:    General: Skin is warm and dry.  Neurological:     Mental Status: She is alert and oriented to person, place, and time.  Psychiatric:        Mood and Affect: Mood normal.        Behavior: Behavior normal.        Judgment: Judgment normal.    BP 115/60 (BP Location: Right Arm, Patient Position: Sitting, Cuff Size: Normal)   Pulse 65   Temp 98.7 F (37.1 C) (Oral)   Resp 16  Ht '5\' 4"'$  (1.626 m)   Wt 161 lb 6.4 oz (73.2 kg)   SpO2 98%   BMI 27.70 kg/m  Wt Readings from Last 3 Encounters:  01/09/22 161 lb 6.4 oz (73.2 kg)  01/08/22 161 lb 2 oz (73.1 kg)  12/03/21 167 lb (75.8 kg)   Diabetic Foot Exam - Simple   No data filed    Lab Results  Component Value Date   WBC 7.3 12/03/2021   HGB 12.5 12/03/2021   HCT 38.5 12/03/2021   PLT 299.0 12/03/2021   GLUCOSE 106 (H) 12/03/2021   CHOL 169 07/08/2021   TRIG 120.0 07/08/2021   HDL 54.80 07/08/2021   LDLDIRECT 107.0 07/01/2016   LDLCALC 91 07/08/2021   ALT 17 12/03/2021   AST 15 12/03/2021   NA 138 12/03/2021   K 5.1 12/03/2021   CL 99 12/03/2021   CREATININE 0.95 12/03/2021   BUN 24 (H) 12/03/2021   CO2 27 12/03/2021   TSH 0.32 (L) 07/08/2021   INR 1.01 06/21/2015   HGBA1C 6.6 (H) 12/03/2021   MICROALBUR <0.7 08/02/2018   Lab Results  Component Value Date   TSH 0.32 (L) 07/08/2021   Lab Results  Component Value Date   WBC 7.3 12/03/2021   HGB 12.5 12/03/2021   HCT 38.5 12/03/2021   MCV 97.8 12/03/2021   PLT 299.0 12/03/2021   Lab Results  Component Value Date   NA 138 12/03/2021   K 5.1 12/03/2021   CO2 27 12/03/2021   GLUCOSE 106 (H) 12/03/2021   BUN 24 (H) 12/03/2021   CREATININE 0.95 12/03/2021   BILITOT 0.4 12/03/2021   ALKPHOS 52 12/03/2021   AST 15 12/03/2021   ALT 17 12/03/2021   PROT 7.1 12/03/2021   ALBUMIN 4.7 12/03/2021   CALCIUM 9.8 12/03/2021   ANIONGAP 9 03/13/2021   GFR 60.06 12/03/2021   Lab Results  Component Value Date   CHOL 169  07/08/2021   Lab Results  Component Value Date   HDL 54.80 07/08/2021   Lab Results  Component Value Date   LDLCALC 91 07/08/2021   Lab Results  Component Value Date   TRIG 120.0 07/08/2021   Lab Results  Component Value Date   CHOLHDL 3 07/08/2021   Lab Results  Component Value Date   HGBA1C 6.6 (H) 12/03/2021      Assessment & Plan:   Problem List Items Addressed This Visit     Type 2 diabetes mellitus with diabetic neuropathy, without long-term current use of insulin (HCC)    hgba1c acceptable, minimize simple carbs. Increase exercise as tolerated. Continue current meds      Essential hypertension    Well controlled, no changes to meds. Encouraged heart healthy diet such as the DASH diet and exercise as tolerated.       Relevant Medications   metoprolol succinate (TOPROL-XL) 25 MG 24 hr tablet   Other Relevant Orders   CBC   Osteoporosis    Encouraged to get adequate exercise, calcium and vitamin d intake. Dexa scan this year showed progression to osteoporosis. She has been started on Fosamax weekly      Hypothyroid    On Levothyroxine, continue to monitor      Relevant Medications   metoprolol succinate (TOPROL-XL) 25 MG 24 hr tablet   Other Relevant Orders   TSH   Dysuria    She has just finished antibiotics and she is having Dysuria again. Check UA and culture      Relevant  Orders   Urinalysis   Urine Culture   Dysphagia    Swallow study was good so no dilatation required. She is feeling well, no trouble with eating at present.      Left wrist fracture, sequela    [address] when she was rushing through her house while some construction in April and she broke her radius. She was followed by ortho.      Other Visit Diagnoses     Low back pain radiating to left lower extremity    -  Primary   Relevant Orders   POCT Urinalysis Dipstick (Automated) (Completed)   Renal insufficiency       Relevant Orders   Comprehensive metabolic panel   Need for  influenza vaccination       Relevant Orders   Flu Vaccine QUAD High Dose(Fluad) (Completed)      Meds ordered this encounter  Medications   metoprolol succinate (TOPROL-XL) 25 MG 24 hr tablet    Sig: Take 1 tablet (25 mg total) by mouth daily.    Dispense:  90 tablet    Refill:  1   I, Penni Homans, MD, personally preformed the services described in this documentation.  All medical record entries made by the scribe were at my direction and in my presence.  I have reviewed the chart and discharge instructions (if applicable) and agree that the record reflects my personal performance and is accurate and complete. 01/09/2022  I,Mohammed Iqbal,acting as a scribe for Penni Homans, MD.,have documented all relevant documentation on the behalf of Penni Homans, MD,as directed by  Penni Homans, MD while in the presence of Penni Homans, MD.  Penni Homans, MD

## 2022-01-09 NOTE — Patient Instructions (Addendum)
RSV (respiratory syncitial virus) vaccine at pharmacy, [facility]  Covid booster when new version out late September At pharmacy High dose flu shot mid Sept to mid Oct  Tetanus in 2026 unless you are injured then take it early  Dehydration, Adult Dehydration is a condition in which there is not enough water or other fluids in the body. This happens when a person loses more fluids than he or she takes in. Important organs, such as the kidneys, brain, and heart, cannot function without a proper amount of fluids. Any loss of fluids from the body can lead to dehydration. Dehydration can be mild, moderate, or severe. It should be treated right away to prevent it from becoming severe. What are the causes? Dehydration may be caused by: Conditions that cause loss of water or other fluids, such as diarrhea, vomiting, or sweating or urinating a lot. Not drinking enough fluids, especially when you are ill or doing activities that require a lot of energy. Other illnesses and conditions, such as fever or infection. Certain medicines, such as medicines that remove excess fluid from the body (diuretics). Lack of safe drinking water. Not being able to get enough water and food. What increases the risk? The following factors may make you more likely to develop this condition: Having a long-term (chronic) illness that has not been treated properly, such as diabetes, heart disease, or kidney disease. Being 12 years of age or older. Having a disability. Living in a place that is high in altitude, where thinner, drier air causes more fluid loss. Doing exercises that put stress on your body for a long time (endurance sports). What are the signs or symptoms? Symptoms of dehydration depend on how severe it is. Mild or moderate dehydration Thirst. Dry lips or dry mouth. Dizziness or light-headedness, especially when standing up from a seated position. Muscle cramps. Dark urine. Urine may be the color of tea. Less  urine or tears produced than usual. Headache. Severe dehydration Changes in skin. Your skin may be cold and clammy, blotchy, or pale. Your skin also may not return to normal after being lightly pinched and released. Little or no tears, urine, or sweat. Changes in vital signs, such as rapid breathing and low blood pressure. Your pulse may be weak or may be faster than 100 beats a minute when you are sitting still. Other changes, such as: Feeling very thirsty. Sunken eyes. Cold hands and feet. Confusion. Being very tired (lethargic) or having trouble waking from sleep. Short-term weight loss. Loss of consciousness. How is this diagnosed? This condition is diagnosed based on your symptoms and a physical exam. You may have blood and urine tests to help confirm the diagnosis. How is this treated? Treatment for this condition depends on how severe it is. Treatment should be started right away. Do not wait until dehydration becomes severe. Severe dehydration is an emergency and needs to be treated in a hospital. Mild or moderate dehydration can be treated at home. You may be asked to: Drink more fluids. Drink an oral rehydration solution (ORS). This drink helps restore proper amounts of fluids and salts and minerals in the blood (electrolytes). Severe dehydration can be treated: With IV fluids. By correcting abnormal levels of electrolytes. This is often done by giving electrolytes through a tube that is passed through your nose and into your stomach (nasogastric tube, or NG tube). By treating the underlying cause of dehydration. Follow these instructions at home: Oral rehydration solution If told by your health care provider,  drink an ORS: Make an ORS by following instructions on the package. Start by drinking small amounts, about  cup (120 mL) every 5-10 minutes. Slowly increase how much you drink until you have taken the amount recommended by your health care provider. Eating and  drinking        Drink enough clear fluid to keep your urine pale yellow. If you were told to drink an ORS, finish the ORS first and then start slowly drinking other clear fluids. Drink fluids such as: Water. Do not drink only water. Doing that can lead to hyponatremia, which is having too little salt (sodium) in the body. Water from ice chips you suck on. Fruit juice that you have added water to (diluted fruit juice). Low-calorie sports drinks. Eat foods that contain a healthy balance of electrolytes, such as bananas, oranges, potatoes, tomatoes, and spinach. Do not drink alcohol. Avoid the following: Drinks that contain a lot of sugar. These include high-calorie sports drinks, fruit juice that is not diluted, and soda. Caffeine. Foods that are greasy or contain a lot of fat or sugar. General instructions Take over-the-counter and prescription medicines only as told by your health care provider. Do not take sodium tablets. Doing that can lead to having too much sodium in the body (hypernatremia). Return to your normal activities as told by your health care provider. Ask your health care provider what activities are safe for you. Keep all follow-up visits as told by your health care provider. This is important. Contact a health care provider if: You have muscle cramps, pain, or discomfort, such as: Pain in your abdomen and the pain gets worse or stays in one area (localizes). Stiff neck. You have a rash. You are more irritable than usual. You are sleepier or have a harder time waking than usual. You feel weak or dizzy. You feel very thirsty. Get help right away if you have: Any symptoms of severe dehydration. Symptoms of vomiting, such as: You cannot eat or drink without vomiting. Vomiting gets worse or does not go away. Vomit includes blood or green matter (bile). Symptoms that get worse with treatment. A fever. A severe headache. Problems with urination or bowel movements,  such as: Diarrhea that gets worse or does not go away. Blood in your stool (feces). This may cause stool to look black and tarry. Not urinating, or urinating only a small amount of very dark urine, within 6-8 hours. Trouble breathing. These symptoms may represent a serious problem that is an emergency. Do not wait to see if the symptoms will go away. Get medical help right away. Call your local emergency services (911 in the [place] Do not drive yourself to the hospital. Summary Dehydration is a condition in which there is not enough water or other fluids in the body. This happens when a person loses more fluids than he or she takes in. Treatment for this condition depends on how severe it is. Treatment should be started right away. Do not wait until dehydration becomes severe. Drink enough clear fluid to keep your urine pale yellow. If you were told to drink an oral rehydration solution (ORS), finish the ORS first and then start slowly drinking other clear fluids. Take over-the-counter and prescription medicines only as told by your health care provider. Get help right away if you have any symptoms of severe dehydration. This information is not intended to replace advice given to you by your health care provider. Make sure you discuss any questions you have with  your health care provider. Document Revised: 08/14/2021 Document Reviewed: 11/18/2018 Elsevier Patient Education  [place].

## 2022-01-10 ENCOUNTER — Encounter: Payer: Self-pay | Admitting: Gastroenterology

## 2022-01-10 ENCOUNTER — Other Ambulatory Visit: Payer: Self-pay

## 2022-01-10 ENCOUNTER — Other Ambulatory Visit (HOSPITAL_BASED_OUTPATIENT_CLINIC_OR_DEPARTMENT_OTHER): Payer: Self-pay

## 2022-01-10 DIAGNOSIS — Z8619 Personal history of other infectious and parasitic diseases: Secondary | ICD-10-CM | POA: Insufficient documentation

## 2022-01-10 MED ORDER — CEFDINIR 300 MG PO CAPS
300.0000 mg | ORAL_CAPSULE | Freq: Two times a day (BID) | ORAL | 0 refills | Status: AC
  Filled 2022-01-10: qty 10, 5d supply, fill #0

## 2022-01-10 NOTE — Telephone Encounter (Signed)
Left message for pt to return my call.

## 2022-01-10 NOTE — Telephone Encounter (Signed)
Pt returned my call and was notified of below. She voices understanding and will restart twice daily dosing today.  Her BP was 128/87 and pulse was 69 this morning. She will monitor bp and pulse for the next week and send readings via mychart. Pt is very appreciative of PCP's recommendation.

## 2022-01-10 NOTE — Progress Notes (Signed)
Dysphagia  GASTROENTEROLOGY OUTPATIENT CLINIC VISIT   Primary Care Provider Mosie Lukes, MD [place] [address] [phone]  Patient Profile: Victoria Walker is a 71 y.o. female with a pmh significant for Diabetes, hypertension, hyperlipidemia, hypothyroidism, prior breast cancer, sciatica, GERD, hiatal hernia, cervical ring (s/p dilation), colon polyps (TAs), H. pylori infection (confirmed eradication and now with recurrence).  The patient presents to the [facility] for an evaluation and management of problem(s) noted below:  Problem List 1. Esophageal dysphagia   2. History of Helicobacter infection   3. History of recurrent infection   4. Gastroesophageal reflux disease, unspecified whether esophagitis present     History of Present Illness Please see prior notes for full details of HPI.  Interval History Patient returns for scheduled follow-up.  A we had her move forward with esophageal manometry testing this year with results as noted below.  She has no evidence of an esophageal dysmotility.  Patient states that overall her dysphagia symptoms are stable.  If she takes her time and chews thoroughly and is not rushing while eating, she does not have symptoms of dysphagia.  It is when she is rushing or eating quickly or taking multiple pills at the similar time that she will have symptoms.  She is not losing weight because of her food intake however.  She has completed a third round of H. pylori treatment this time a clarithromycin-based triple therapy due to her allergies.  She is not experiencing significant abdominal discomfort or dyspepsia symptoms currently.  She is now off PPI therapy at this time.  She has not had any alterations of her bowel habits.  GI Review of Systems Positive as above Negative for odynophagia, nausea, vomiting, bloating, melena, hematochezia   Review of Systems General: Denies  fevers/chills/weight loss unintentionally Cardiovascular: Denies chest pain Pulmonary: Denies shortness of breath Gastroenterological: See HPI Genitourinary: Denies darkened urine Hematological: Denies easy bruising/bleeding Dermatological: Denies jaundice Psychological: Mood is stable   Medications Current Outpatient Medications  Medication Sig Dispense Refill   alendronate (FOSAMAX) 70 MG tablet Take 1 tablet (70 mg total) by mouth once a week. Take with a full glass of water on an empty stomach. 12 tablet 1   aspirin 81 MG EC tablet Take 81 mg by mouth every other day. Swallow whole.     atorvastatin (LIPITOR) 40 MG tablet TAKE 1 TABLET BY MOUTH EVERY OTHER DAY, ALTERNATING WITH 1/2 TABLET 180 tablet 1   AVAPRO 75 MG tablet TAKE 1 TABLET (75 MG TOTAL) BY MOUTH DAILY. 90 tablet 1   Calcium Carbonate-Vitamin D (OS-CAL 500 + D PO)      Calcium Polycarbophil (FIBER) 625 MG TABS Take 1 tablet by mouth daily.     Carboxymethylcellul-Glycerin (REFRESH OPTIVE OP) Apply to eye.     Cholecalciferol (VITAMIN D) 2000 UNITS CAPS Take 1 capsule by mouth every evening.      Ciclopirox 1 % shampoo Apply 1(one) application(s) topically 2(two) times a week 120 mL 11   cycloSPORINE (RESTASIS) 0.05 % ophthalmic emulsion Place 1 drop into both eyes 2 (two) times daily 180 each 3   diltiazem (TIADYLT ER) 360 MG 24 hr capsule TAKE 1 CAPSULE (360 MG TOTAL) BY MOUTH DAILY. 90 capsule 1   Estradiol 10 MCG TABS vaginal tablet INSERT 1 TAB VAGINALLY AT BEDTIME EVERY OTHER DAY 48 tablet 4   gabapentin (NEURONTIN) 300 MG capsule TAKE 1 CAPSULE (300 MG TOTAL) BY MOUTH AT  BEDTIME. 90 capsule 1   glucose blood (ONE TOUCH TEST STRIPS) test strip Use as directed twice daily to check blood sugar. DX code E11.9 300 each 1   levocetirizine (XYZAL) 5 MG tablet TAKE 1 TABLET (5 MG TOTAL) BY MOUTH EVERY EVENING. 90 tablet 3   levothyroxine (SYNTHROID) 88 MCG tablet TAKE 1 TABLET BY MOUTH ONCE DAILY **SKIP SUNDAY** 90 tablet 1    loteprednol (LOTEMAX) 0.5 % ophthalmic suspension Place one drop into both eyes four times daily with flare up. 10 mL 3   metFORMIN (GLUCOPHAGE-XR) 750 MG 24 hr tablet Take 1 tablet (750 mg total) by mouth in the morning and at bedtime. 180 tablet 1   mometasone (NASONEX) 50 MCG/ACT nasal spray Place 1-2 sprays in each nostril daily as needed for stuffy nose. 51 g 1   NEXIUM 40 MG capsule TAKE 1 CAPSULE BY MOUTH ONCE DAILY 90 capsule 1   Probiotic Product (PROBIOTIC DAILY PO) Take 1 capsule by mouth daily.     spironolactone (ALDACTONE) 25 MG tablet TAKE 1 & 1/2 TABLETS BY MOUTH DAILY 135 tablet 1   metoprolol succinate (TOPROL-XL) 25 MG 24 hr tablet Take 1 tablet (25 mg total) by mouth 2 (two) times daily. 180 tablet 1   No current facility-administered medications for this visit.    Allergies Allergies  Allergen Reactions   Codeine Anxiety    Jittery    Penicillins Itching   Sulfa Antibiotics Itching   Vicodin [Hydrocodone-Acetaminophen] Itching    ITCHING     Histories Past Medical History:  Diagnosis Date   Arthritis    "both knees" (07/02/2015)   Cancer of right breast (HCC)    Claustrophobia    Dysrhythmia    HX OF FAST HEART RATE AND PALPITATIONS - METOPROLOL HAS HELPED   GERD (gastroesophageal reflux disease)    H/O hiatal hernia    H/O iritis    LEFT EYE - STATES HER EYE BECOMES RED AND VERY SENSITIVE TO LIGHT WHEN FLARE UP OF IRITIS   Heart murmur    "benign; 06/2015"   High cholesterol    History of blood transfusion    "when I had my partial hysterectomy"   History of chickenpox 09/26/2014   History of measles    History of MRSA infection    History of shingles    Hypertension    Hypothyroidism    Medicare annual wellness visit, initial 04/07/2015   Multinodular thyroid    PT HAVING TROUBLE SWALLOWING   Neuropathy    FEET   Primary localized osteoarthritis of right knee    Sciatica of right side 04/25/2015   Spinal headache    spinal headache with  epidural   Total knee replacement status, right    Type II diabetes mellitus ([place])    ORAL MEDICATION - NO INSULIN   Wears glasses    Past Surgical History:  Procedure Laterality Date   ABDOMINAL HYSTERECTOMY  1988   ABDOMINOPLASTY  1986   ANTERIOR AND POSTERIOR REPAIR  05/11/2012   Procedure: ANTERIOR (CYSTOCELE) AND POSTERIOR REPAIR (RECTOCELE);  Surgeon: Reece Packer, MD;  Location: [facility];  Service: Urology;;  with graft   BREAST BIOPSY Right    CARDIAC CATHETERIZATION  1999   COLONOSCOPY     CYSTOSCOPY WITH URETHRAL DILATATION  05/11/2012   Procedure: CYSTOSCOPY WITH URETHRAL DILATATION;  Surgeon: Reece Packer, MD;  Location: [facility];  Service: Urology;;   ESOPHAGEAL MANOMETRY N/A 10/09/2021   Procedure: ESOPHAGEAL MANOMETRY (EM);  Surgeon: Irving Copas., MD;  Location: [facility];  Service: Gastroenterology;  Laterality: N/A;   ESOPHAGOGASTRODUODENOSCOPY     HERNIA REPAIR     MASTECTOMY Right 2007   PLANTAR FASCIA SURGERY Left    THYROIDECTOMY N/A 02/23/2014   Procedure: TOTAL THYROIDECTOMY;  Surgeon: Armandina Gemma, MD;  Location: [facility];  Service: General;  Laterality: N/A;   TOTAL KNEE ARTHROPLASTY Right 07/02/2015   TOTAL KNEE ARTHROPLASTY Right 07/02/2015   Procedure: TOTAL KNEE ARTHROPLASTY;  Surgeon: Elsie Saas, MD;  Location: [place];  Service: Orthopedics;  Laterality: Right;   TOTAL THYROIDECTOMY  1976   removed three tumor (2 on right; 1 on left)   TUBAL LIGATION     UMBILICAL HERNIA REPAIR  1986   VAGINAL PROLAPSE REPAIR  05/11/2012   Procedure: VAGINAL VAULT SUSPENSION;  Surgeon: Reece Packer, MD;  Location: [facility];  Service: Urology;;   VULVA / PERINEUM BIOPSY     vulvar   Social History   Socioeconomic History   Marital status: Married    Spouse name: Not on file   Number of children: 3   Years of education: Not on file   Highest education level: Not on file  Occupational History    Comment: Retired  Tobacco Use   Smoking  status: Never   Smokeless tobacco: Never  Vaping Use   Vaping Use: Never used  Substance and Sexual Activity   Alcohol use: No    Alcohol/week: 0.0 standard drinks of alcohol   Drug use: No   Sexual activity: Not Currently    Birth control/protection: Surgical    Comment: lives with husband, retired from various jobs, diabetic diet  Other Topics Concern   Not on file  Social History Narrative   Not on file   Social Determinants of Health   Financial Resource Strain: [place]  (11/07/2020)   Overall Financial Resource Strain (CARDIA)    Difficulty of Paying Living Expenses: Not hard at all  Food Insecurity: No [place] (11/07/2020)   Hunger Vital Sign    Worried About Running Out of Food in the Last Year: Never true    Home Garden in the Last Year: Never true  Transportation Needs: No Transportation Needs (11/07/2020)   PRAPARE - Hydrologist (Medical): No    Lack of Transportation (Non-Medical): No  Physical Activity: Insufficiently Active (11/07/2020)   Exercise Vital Sign    Days of Exercise per Week: 2 days    Minutes of Exercise per Session: 20 min  Stress: No Stress Concern Present (11/07/2020)   [place]    Feeling of Stress : Not at all  Social Connections: Moderately Integrated (11/07/2020)   Social Connection and Isolation Panel [NHANES]    Frequency of Communication with Friends and Family: More than three times a week    Frequency of Social Gatherings with Friends and Family: More than three times a week    Attends Religious Services: More than 4 times per year    Active Member of [employer] or Organizations: No    Attends Archivist Meetings: Never    Marital Status: Married  Human resources officer Violence: Not At Risk (11/07/2020)   Humiliation, Afraid, Rape, and Kick questionnaire    Fear of Current or Ex-Partner: No    Emotionally Abused: No    Physically  Abused: No    Sexually Abused: No   Family History  Problem Relation Age of Onset   Hypertension Mother    Heart disease Mother        pacer, CHF   Hyperlipidemia Mother    Kidney disease Mother        1 kidney due to stone   Kidney Stones Mother    Hypertension Father    Diabetes Father    Alcohol abuse Father    Lung cancer Father        lung, psa   Diabetes Sister    Hypertension Sister    Breast cancer Sister    Diabetes Brother    Hypertension Brother    Hyperlipidemia Brother    Kidney disease Brother        dialysis   Stroke Brother    Hypertension Maternal Grandmother    Heart disease Maternal Grandmother    Stroke Maternal Grandmother    Hypertension Maternal Grandfather    Hypertension Daughter    Arthritis Son    Hyperlipidemia Sister    Hypertension Sister    Diabetes Sister    Thyroid disease Sister    Kidney disease Sister    Endometriosis Sister    Breast cancer Sister        thyroid and breast   Thyroid cancer Sister    Arthritis Sister        back pain   Hypertension Sister    Thyroid disease Sister    Hypertension Brother    Hyperlipidemia Brother    Hypertension Brother    Hyperlipidemia Brother    Benign prostatic hyperplasia Brother    Hypertension Daughter    Hyperlipidemia Daughter    Diabetes Daughter    Hypertension Maternal Aunt    Hypertension Maternal Uncle    Hypertension Paternal Aunt    Cancer Paternal Aunt    Hypertension Paternal Uncle    Colon cancer Neg Hx    Stomach cancer Neg Hx    Esophageal cancer Neg Hx    Pancreatic cancer Neg Hx    Liver disease Neg Hx    Rectal cancer Neg Hx    Inflammatory bowel disease Neg Hx    I have reviewed her medical, social, and family history in detail and updated the electronic medical record as necessary.    PHYSICAL EXAMINATION  BP (!) 100/58 (BP Location: Left Arm, Patient Position: Sitting, Cuff Size: Normal)   Pulse 80   Ht 5' 4.5" (1.638 m) Comment: height measured  without shoes  Wt 161 lb 2 oz (73.1 kg)   BMI 27.23 kg/m  Wt Readings from Last 3 Encounters:  01/09/22 161 lb 6.4 oz (73.2 kg)  01/08/22 161 lb 2 oz (73.1 kg)  12/03/21 167 lb (75.8 kg)  GEN: NAD, appears stated age, doesn't appear chronically ill PSYCH: Cooperative, without pressured speech EYE: Conjunctivae pink, sclerae anicteric ENT: MMM CV: Nontachycardic RESP: No audible wheezing GI: NABS, soft, rounded, NT/ND, without rebound MSK/EXT: No significant lower extremity edema SKIN: No jaundice NEURO:  Alert & Oriented x 3, no focal deficits   REVIEW OF DATA  I reviewed the following data at the time of this encounter:  GI Procedures and Studies  June 2023 esophageal manometry   Laboratory Studies  Reviewed those in epic and care everywhere  Imaging Studies  No relevant studies to review   ASSESSMENT  Ms. Berryman is a 71 y.o. female with a pmh significant for Diabetes, hypertension, hyperlipidemia, hypothyroidism, prior breast cancer, sciatica, GERD, hiatal hernia, cervical ring (s/p dilation), colon polyps (TAs),  H. pylori infection (confirmed eradication and now with recurrence).  The patient is seen today for evaluation and management of:  1. Esophageal dysphagia   2. History of Helicobacter infection   3. History of recurrent infection   4. Gastroesophageal reflux disease, unspecified whether esophagitis present    The patient is hemodynamically and clinically stable at this time.  Patient has had improvements with her overall dysphagia after our dilations in although she continues to have some mild symptoms, she feels things are relatively stable when she settles and chews thoroughly and does not rush with eating.  Her esophageal manometry is unremarkable.  We have recommended that she continue to be very thoughtful about how she chews and swallows to try to minimize issues.  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  She is off PPI therapy now and we are going to recheck her to see if she still has evidence of refractory H. pylori infection.  If she does, we will have to find a fourth antibiotic regimen and likely send her to infectious disease to help [place] with trying to find another treatment pathway for her.  Time will tell.  If all looks well on her H. pylori stool antigen, I plan to see her in early 2024 so that we can discuss where symptoms lie in regards to her dysphagia and decide if she would just need a surveillance colonoscopy in 2024 or she will need a repeat upper endoscopy as well 2024.  All patient questions were answered to the best of my ability, and the patient agrees to the aforementioned plan of action with follow-up as indicated.   PLAN  Proceed with H. pylori stool antigen testing - If recurrent infection then likely infectious disease referral will be placed May hold on PPI restart since patient seems to be doing well currently Surveillance colonoscopy in 2024 We will see patient in follow-up in 2024 (earlier if necessary) to decide whether any repeat upper endoscopy will need to be considered in 2024   Orders Placed This Encounter  Procedures   H. pylori antigen, stool    New Prescriptions   No medications on file   Modified Medications   Modified Medication Previous Medication   METOPROLOL SUCCINATE (TOPROL-XL) 25 MG 24 HR TABLET metoprolol succinate (TOPROL-XL) 25 MG 24 hr tablet      Take 1 tablet (25 mg total) by mouth 2 (two) times daily.    Take 1 tablet (25 mg total) by mouth daily.    Planned Follow Up No follow-ups on file.   Total Time in Face-to-Face and in Coordination of Care for patient including independent/personal interpretation/review of prior testing, medical history,  examination, medication adjustment, communicating results with the patient directly, and documentation with the EHR is 25 minutes.  Justice Britain, MD [facility] Office # [phone]

## 2022-01-12 LAB — URINE CULTURE
MICRO NUMBER:: 13949822
SPECIMEN QUALITY:: ADEQUATE

## 2022-01-17 ENCOUNTER — Other Ambulatory Visit: Payer: Medicare Other

## 2022-01-17 DIAGNOSIS — R1319 Other dysphagia: Secondary | ICD-10-CM

## 2022-01-17 DIAGNOSIS — Z8619 Personal history of other infectious and parasitic diseases: Secondary | ICD-10-CM | POA: Diagnosis not present

## 2022-01-17 DIAGNOSIS — K219 Gastro-esophageal reflux disease without esophagitis: Secondary | ICD-10-CM | POA: Diagnosis not present

## 2022-01-19 LAB — H. PYLORI ANTIGEN, STOOL: H pylori Ag, Stl: NEGATIVE

## 2022-01-20 ENCOUNTER — Telehealth: Payer: Self-pay | Admitting: Family Medicine

## 2022-01-20 NOTE — Telephone Encounter (Signed)
Bp readings placed in providers folder for review upon her return.

## 2022-01-20 NOTE — Telephone Encounter (Signed)
Pt came  in office and dropped off copy of her BP reading. Pt stated provider requested the reading. Document put at front office tray under providers name.

## 2022-02-12 ENCOUNTER — Other Ambulatory Visit: Payer: Self-pay | Admitting: Family Medicine

## 2022-02-12 ENCOUNTER — Other Ambulatory Visit (HOSPITAL_BASED_OUTPATIENT_CLINIC_OR_DEPARTMENT_OTHER): Payer: Self-pay

## 2022-02-12 MED ORDER — DILTIAZEM HCL ER BEADS 360 MG PO CP24
360.0000 mg | ORAL_CAPSULE | Freq: Every day | ORAL | 1 refills | Status: DC
  Filled 2022-02-12: qty 90, 90d supply, fill #0
  Filled 2022-05-10: qty 90, 90d supply, fill #1

## 2022-02-13 ENCOUNTER — Other Ambulatory Visit (HOSPITAL_BASED_OUTPATIENT_CLINIC_OR_DEPARTMENT_OTHER): Payer: Self-pay

## 2022-02-14 ENCOUNTER — Other Ambulatory Visit (HOSPITAL_BASED_OUTPATIENT_CLINIC_OR_DEPARTMENT_OTHER): Payer: Self-pay

## 2022-02-14 DIAGNOSIS — E1140 Type 2 diabetes mellitus with diabetic neuropathy, unspecified: Secondary | ICD-10-CM | POA: Diagnosis not present

## 2022-02-24 ENCOUNTER — Other Ambulatory Visit (HOSPITAL_BASED_OUTPATIENT_CLINIC_OR_DEPARTMENT_OTHER): Payer: Self-pay

## 2022-02-24 MED ORDER — COMIRNATY 30 MCG/0.3ML IM SUSY
PREFILLED_SYRINGE | INTRAMUSCULAR | 0 refills | Status: DC
Start: 2022-02-24 — End: 2022-05-01
  Filled 2022-02-24: qty 0.3, 1d supply, fill #0

## 2022-02-27 DIAGNOSIS — E1140 Type 2 diabetes mellitus with diabetic neuropathy, unspecified: Secondary | ICD-10-CM | POA: Diagnosis not present

## 2022-03-18 ENCOUNTER — Other Ambulatory Visit: Payer: Self-pay

## 2022-03-18 ENCOUNTER — Other Ambulatory Visit (HOSPITAL_BASED_OUTPATIENT_CLINIC_OR_DEPARTMENT_OTHER): Payer: Self-pay

## 2022-03-18 MED ORDER — FLUCONAZOLE 150 MG PO TABS
150.0000 mg | ORAL_TABLET | Freq: Once | ORAL | 0 refills | Status: AC
  Filled 2022-03-18: qty 2, 2d supply, fill #0

## 2022-03-18 NOTE — Progress Notes (Signed)
Patient has yeast infection. Kathrene Alu RN

## 2022-03-19 ENCOUNTER — Other Ambulatory Visit (HOSPITAL_BASED_OUTPATIENT_CLINIC_OR_DEPARTMENT_OTHER): Payer: Self-pay

## 2022-03-19 ENCOUNTER — Other Ambulatory Visit: Payer: Self-pay | Admitting: Family Medicine

## 2022-03-19 DIAGNOSIS — E785 Hyperlipidemia, unspecified: Secondary | ICD-10-CM

## 2022-03-19 DIAGNOSIS — Z780 Asymptomatic menopausal state: Secondary | ICD-10-CM

## 2022-03-19 DIAGNOSIS — I10 Essential (primary) hypertension: Secondary | ICD-10-CM

## 2022-03-19 DIAGNOSIS — K219 Gastro-esophageal reflux disease without esophagitis: Secondary | ICD-10-CM

## 2022-03-19 DIAGNOSIS — R109 Unspecified abdominal pain: Secondary | ICD-10-CM

## 2022-03-19 MED ORDER — NEXIUM 40 MG PO CPDR
40.0000 mg | DELAYED_RELEASE_CAPSULE | Freq: Every day | ORAL | 1 refills | Status: DC
  Filled 2022-03-19: qty 90, 90d supply, fill #0
  Filled 2022-06-19: qty 90, 90d supply, fill #1

## 2022-03-19 MED ORDER — SPIRONOLACTONE 25 MG PO TABS
37.5000 mg | ORAL_TABLET | Freq: Every day | ORAL | 1 refills | Status: DC
Start: 2022-03-19 — End: 2022-06-10
  Filled 2022-03-19: qty 135, 90d supply, fill #0

## 2022-03-19 MED ORDER — GABAPENTIN 300 MG PO CAPS
300.0000 mg | ORAL_CAPSULE | Freq: Every day | ORAL | 1 refills | Status: DC
  Filled 2022-03-19: qty 90, 90d supply, fill #0
  Filled 2022-06-19: qty 90, 90d supply, fill #1

## 2022-03-20 ENCOUNTER — Other Ambulatory Visit (HOSPITAL_BASED_OUTPATIENT_CLINIC_OR_DEPARTMENT_OTHER): Payer: Self-pay

## 2022-03-21 ENCOUNTER — Other Ambulatory Visit (HOSPITAL_BASED_OUTPATIENT_CLINIC_OR_DEPARTMENT_OTHER): Payer: Self-pay

## 2022-03-24 ENCOUNTER — Other Ambulatory Visit (HOSPITAL_BASED_OUTPATIENT_CLINIC_OR_DEPARTMENT_OTHER): Payer: Self-pay

## 2022-03-25 ENCOUNTER — Ambulatory Visit
Admission: RE | Admit: 2022-03-25 | Discharge: 2022-03-25 | Disposition: A | Discharge status: Home / Self Care | Payer: Medicare Other | Source: Ambulatory Visit | Attending: Urgent Care | Admitting: Urgent Care

## 2022-03-25 ENCOUNTER — Other Ambulatory Visit (HOSPITAL_BASED_OUTPATIENT_CLINIC_OR_DEPARTMENT_OTHER): Payer: Self-pay

## 2022-03-25 VITALS — BP 117/70 | HR 70 | Temp 98.6°F | Resp 18

## 2022-03-25 DIAGNOSIS — J019 Acute sinusitis, unspecified: Secondary | ICD-10-CM

## 2022-03-25 DIAGNOSIS — J309 Allergic rhinitis, unspecified: Secondary | ICD-10-CM

## 2022-03-25 MED ORDER — DOXYCYCLINE HYCLATE 100 MG PO CAPS
100.0000 mg | ORAL_CAPSULE | Freq: Two times a day (BID) | ORAL | 0 refills | Status: DC
  Filled 2022-03-25: qty 14, 7d supply, fill #0

## 2022-03-25 MED ORDER — FLUCONAZOLE 150 MG PO TABS
150.0000 mg | ORAL_TABLET | ORAL | 0 refills | Status: DC
  Filled 2022-03-25: qty 2, 14d supply, fill #0

## 2022-03-25 NOTE — ED Triage Notes (Signed)
Pt c/o nasal congestion and drainage x 1-2 weeks-NAD-steady gait

## 2022-03-25 NOTE — ED Provider Notes (Signed)
[facility]  Note:  This document was prepared using Systems analyst and may include unintentional dictation errors.  MRN: [id number] DOB: 1950-06-01  Subjective:   Victoria Walker is a 71 y.o. female presenting for 1 to 2-week history of persistent sinus congestion, sinus drainage, throat discomfort.  Patient has had occasional cough.  Has a history of sinus infections and this feels the same.  Has a history of an allergic rhinitis and uses her allergy medications consistently.  No chest pain, shortness of breath or wheezing.  No current facility-administered medications for this encounter.  Current Outpatient Medications:    alendronate (FOSAMAX) 70 MG tablet, Take 1 tablet (70 mg total) by mouth once a week. Take with a full glass of water on an empty stomach., Disp: 12 tablet, Rfl: 1   aspirin 81 MG EC tablet, Take 81 mg by mouth every other day. Swallow whole., Disp: , Rfl:    atorvastatin (LIPITOR) 40 MG tablet, TAKE 1 TABLET BY MOUTH EVERY OTHER DAY, ALTERNATING WITH 1/2 TABLET, Disp: 180 tablet, Rfl: 1   AVAPRO 75 MG tablet, TAKE 1 TABLET (75 MG TOTAL) BY MOUTH DAILY., Disp: 90 tablet, Rfl: 1   Calcium Carbonate-Vitamin D (OS-CAL 500 + D PO), , Disp: , Rfl:    Calcium Polycarbophil (FIBER) 625 MG TABS, Take 1 tablet by mouth daily., Disp: , Rfl:    Carboxymethylcellul-Glycerin (REFRESH OPTIVE OP), Apply to eye., Disp: , Rfl:    Cholecalciferol (VITAMIN D) 2000 UNITS CAPS, Take 1 capsule by mouth every evening. , Disp: , Rfl:    Ciclopirox 1 % shampoo, Apply 1(one) application(s) topically 2(two) times a week, Disp: 120 mL, Rfl: 11   COVID-19 mRNA vaccine 2023-2024 (COMIRNATY) syringe, Inject into the muscle., Disp: 0.3 mL, Rfl: 0   cycloSPORINE (RESTASIS) 0.05 % ophthalmic emulsion, Place 1 drop into both eyes 2 (two) times daily, Disp: 180 each, Rfl: 3   diltiazem (TIADYLT ER) 360 MG 24 hr capsule, Take 1 capsule (360 mg total) by mouth  daily., Disp: 90 capsule, Rfl: 1   Estradiol 10 MCG TABS vaginal tablet, INSERT 1 TAB VAGINALLY AT BEDTIME EVERY OTHER DAY, Disp: 48 tablet, Rfl: 4   gabapentin (NEURONTIN) 300 MG capsule, Take 1 capsule (300 mg total) by mouth at bedtime., Disp: 90 capsule, Rfl: 1   glucose blood (ONE TOUCH TEST STRIPS) test strip, Use as directed twice daily to check blood sugar. DX code E11.9, Disp: 300 each, Rfl: 1   levocetirizine (XYZAL) 5 MG tablet, TAKE 1 TABLET (5 MG TOTAL) BY MOUTH EVERY EVENING., Disp: 90 tablet, Rfl: 3   levothyroxine (SYNTHROID) 88 MCG tablet, TAKE 1 TABLET BY MOUTH ONCE DAILY **SKIP SUNDAY**, Disp: 90 tablet, Rfl: 1   loteprednol (LOTEMAX) 0.5 % ophthalmic suspension, Place one drop into both eyes four times daily with flare up., Disp: 10 mL, Rfl: 3   metFORMIN (GLUCOPHAGE-XR) 750 MG 24 hr tablet, Take 1 tablet (750 mg total) by mouth in the morning and at bedtime., Disp: 180 tablet, Rfl: 1   metoprolol succinate (TOPROL-XL) 25 MG 24 hr tablet, Take 1 tablet (25 mg total) by mouth 2 (two) times daily., Disp: 180 tablet, Rfl: 1   mometasone (NASONEX) 50 MCG/ACT nasal spray, Place 1-2 sprays in each nostril daily as needed for stuffy nose., Disp: 51 g, Rfl: 1   NEXIUM 40 MG capsule, Take 1 capsule (40 mg total) by mouth daily., Disp: 90 capsule, Rfl: 1   Probiotic  Product (PROBIOTIC DAILY PO), Take 1 capsule by mouth daily., Disp: , Rfl:    spironolactone (ALDACTONE) 25 MG tablet, Take 1.5 tablets (37.5 mg total) by mouth daily., Disp: 135 tablet, Rfl: 1   Allergies  Allergen Reactions   Codeine Anxiety    Jittery    Penicillins Itching   Sulfa Antibiotics Itching   Vicodin [Hydrocodone-Acetaminophen] Itching    ITCHING     Past Medical History:  Diagnosis Date   Arthritis    "both knees" (07/02/2015)   Cancer of right breast (HCC)    Claustrophobia    Dysrhythmia    HX OF FAST HEART RATE AND PALPITATIONS - METOPROLOL HAS HELPED   GERD (gastroesophageal reflux disease)     H/O hiatal hernia    H/O iritis    LEFT EYE - STATES HER EYE BECOMES RED AND VERY SENSITIVE TO LIGHT WHEN FLARE UP OF IRITIS   Heart murmur    "benign; 06/2015"   High cholesterol    History of blood transfusion    "when I had my partial hysterectomy"   History of chickenpox 09/26/2014   History of measles    History of MRSA infection    History of shingles    Hypertension    Hypothyroidism    Medicare annual wellness visit, initial 04/07/2015   Multinodular thyroid    PT HAVING TROUBLE SWALLOWING   Neuropathy    FEET   Primary localized osteoarthritis of right knee    Sciatica of right side 04/25/2015   Spinal headache    spinal headache with epidural   Total knee replacement status, right    Type II diabetes mellitus ([place])    ORAL MEDICATION - NO INSULIN   Wears glasses      Past Surgical History:  Procedure Laterality Date   ABDOMINAL HYSTERECTOMY  1988   ABDOMINOPLASTY  1986   ANTERIOR AND POSTERIOR REPAIR  05/11/2012   Procedure: ANTERIOR (CYSTOCELE) AND POSTERIOR REPAIR (RECTOCELE);  Surgeon: Reece Packer, MD;  Location: [facility];  Service: Urology;;  with graft   BREAST BIOPSY Right    CARDIAC CATHETERIZATION  1999   COLONOSCOPY     CYSTOSCOPY WITH URETHRAL DILATATION  05/11/2012   Procedure: CYSTOSCOPY WITH URETHRAL DILATATION;  Surgeon: Reece Packer, MD;  Location: [facility];  Service: Urology;;   ESOPHAGEAL MANOMETRY N/A 10/09/2021   Procedure: ESOPHAGEAL MANOMETRY (EM);  Surgeon: Irving Copas., MD;  Location: [facility];  Service: Gastroenterology;  Laterality: N/A;   ESOPHAGOGASTRODUODENOSCOPY     HERNIA REPAIR     MASTECTOMY Right 2007   PLANTAR FASCIA SURGERY Left    THYROIDECTOMY N/A 02/23/2014   Procedure: TOTAL THYROIDECTOMY;  Surgeon: Armandina Gemma, MD;  Location: [facility];  Service: General;  Laterality: N/A;   TOTAL KNEE ARTHROPLASTY Right 07/02/2015   TOTAL KNEE ARTHROPLASTY Right 07/02/2015   Procedure: TOTAL KNEE ARTHROPLASTY;  Surgeon:  Elsie Saas, MD;  Location: [facility];  Service: Orthopedics;  Laterality: Right;   TOTAL THYROIDECTOMY  1976   removed three tumor (2 on right; 1 on left)   TUBAL LIGATION     UMBILICAL HERNIA REPAIR  1986   VAGINAL PROLAPSE REPAIR  05/11/2012   Procedure: VAGINAL VAULT SUSPENSION;  Surgeon: Reece Packer, MD;  Location: [facility];  Service: Urology;;   VULVA / PERINEUM BIOPSY     vulvar    Family History  Problem Relation Age of Onset   Hypertension Mother    Heart disease Mother  pacer, CHF   Hyperlipidemia Mother    Kidney disease Mother        1 kidney due to stone   Kidney Stones Mother    Hypertension Father    Diabetes Father    Alcohol abuse Father    Lung cancer Father        lung, psa   Diabetes Sister    Hypertension Sister    Breast cancer Sister    Diabetes Brother    Hypertension Brother    Hyperlipidemia Brother    Kidney disease Brother        dialysis   Stroke Brother    Hypertension Maternal Grandmother    Heart disease Maternal Grandmother    Stroke Maternal Grandmother    Hypertension Maternal Grandfather    Hypertension Daughter    Arthritis Son    Hyperlipidemia Sister    Hypertension Sister    Diabetes Sister    Thyroid disease Sister    Kidney disease Sister    Endometriosis Sister    Breast cancer Sister        thyroid and breast   Thyroid cancer Sister    Arthritis Sister        back pain   Hypertension Sister    Thyroid disease Sister    Hypertension Brother    Hyperlipidemia Brother    Hypertension Brother    Hyperlipidemia Brother    Benign prostatic hyperplasia Brother    Hypertension Daughter    Hyperlipidemia Daughter    Diabetes Daughter    Hypertension Maternal Aunt    Hypertension Maternal Uncle    Hypertension Paternal Aunt    Cancer Paternal Aunt    Hypertension Paternal Uncle    Colon cancer Neg Hx    Stomach cancer Neg Hx    Esophageal cancer Neg Hx    Pancreatic cancer Neg Hx    Liver disease Neg Hx     Rectal cancer Neg Hx    Inflammatory bowel disease Neg Hx     Social History   Tobacco Use   Smoking status: Never   Smokeless tobacco: Never  Vaping Use   Vaping Use: Never used  Substance Use Topics   Alcohol use: No    Alcohol/week: 0.0 standard drinks of alcohol   Drug use: No    ROS   Objective:   Vitals: BP 117/70 (BP Location: Left Arm)   Pulse 70   Temp 98.6 F (37 C) (Oral)   Resp 18   SpO2 97%   Physical Exam Constitutional:      General: She is not in acute distress.    Appearance: Normal appearance. She is well-developed and normal weight. She is not ill-appearing, toxic-appearing or diaphoretic.  HENT:     Head: Normocephalic and atraumatic.     Right Ear: Tympanic membrane, ear canal and external ear normal. No drainage or tenderness. No middle ear effusion. There is no impacted cerumen. Tympanic membrane is not erythematous or bulging.     Left Ear: Tympanic membrane, ear canal and external ear normal. No drainage or tenderness.  No middle ear effusion. There is no impacted cerumen. Tympanic membrane is not erythematous or bulging.     Nose: Congestion present. No rhinorrhea.     Mouth/Throat:     Mouth: Mucous membranes are moist. No oral lesions.     Pharynx: No pharyngeal swelling, oropharyngeal exudate, posterior oropharyngeal erythema or uvula swelling.     Tonsils: No tonsillar exudate or tonsillar abscesses.  Comments: Cobblestone pattern postnasal drainage overlying pharynx. Eyes:     General: No scleral icterus.       Right eye: No discharge.        Left eye: No discharge.     Extraocular Movements: Extraocular movements intact.     Right eye: Normal extraocular motion.     Left eye: Normal extraocular motion.     Conjunctiva/sclera: Conjunctivae normal.  Cardiovascular:     Rate and Rhythm: Normal rate.  Pulmonary:     Effort: Pulmonary effort is normal.  Musculoskeletal:     Cervical back: Normal range of motion and neck supple.   Lymphadenopathy:     Cervical: No cervical adenopathy.  Skin:    General: Skin is warm and dry.  Neurological:     General: No focal deficit present.     Mental Status: She is alert and oriented to person, place, and time.  Psychiatric:        Mood and Affect: Mood normal.        Behavior: Behavior normal.     Assessment and Plan :   PDMP not reviewed this encounter.  1. Acute sinusitis, recurrence not specified, unspecified location   2. Allergic rhinitis, unspecified seasonality, unspecified trigger     Will start empiric treatment for sinusitis with doxycycline.  Recommended supportive care otherwise including continued use of her allergy medications.  Patient gets yeast infections with antibiotic use and therefore recommended fluconazole.  Creatinine clearance greater than 50 mL/min and therefore appropriate to use oral fluconazole.  Counseled patient on potential for adverse effects with medications prescribed/recommended today, ER and return-to-clinic precautions discussed, patient verbalized understanding.     Jaynee Eagles, [place] 03/25/22 1953

## 2022-03-26 NOTE — Progress Notes (Signed)
[facility] Quality Team Note  Name: SADEEN WIEGEL Date of Birth: [birth year] MRN: [id number] Date: 03/26/2022  [facility] Quality Team has reviewed this patient's chart, please see recommendations below:  [facility] Quality Other; (Ked gap: kidney health evaluation- PATIENT NEEDS URINE MICROALBUMIN/CREATININE RATIO TEST COMPLETED BEFORE END OF YEAR FOR GAP CLOSURE)

## 2022-04-08 DIAGNOSIS — Z96651 Presence of right artificial knee joint: Secondary | ICD-10-CM | POA: Diagnosis not present

## 2022-04-08 DIAGNOSIS — E119 Type 2 diabetes mellitus without complications: Secondary | ICD-10-CM | POA: Diagnosis not present

## 2022-04-08 DIAGNOSIS — M1712 Unilateral primary osteoarthritis, left knee: Secondary | ICD-10-CM | POA: Diagnosis not present

## 2022-04-08 DIAGNOSIS — Z7984 Long term (current) use of oral hypoglycemic drugs: Secondary | ICD-10-CM | POA: Diagnosis not present

## 2022-04-08 DIAGNOSIS — G8929 Other chronic pain: Secondary | ICD-10-CM | POA: Diagnosis not present

## 2022-04-17 DIAGNOSIS — C50911 Malignant neoplasm of unspecified site of right female breast: Secondary | ICD-10-CM | POA: Diagnosis not present

## 2022-04-18 DIAGNOSIS — C50911 Malignant neoplasm of unspecified site of right female breast: Secondary | ICD-10-CM | POA: Diagnosis not present

## 2022-04-29 ENCOUNTER — Ambulatory Visit: Payer: Medicare Other | Admitting: Podiatry

## 2022-04-30 ENCOUNTER — Other Ambulatory Visit (HOSPITAL_BASED_OUTPATIENT_CLINIC_OR_DEPARTMENT_OTHER): Payer: Self-pay

## 2022-05-01 ENCOUNTER — Ambulatory Visit (INDEPENDENT_AMBULATORY_CARE_PROVIDER_SITE_OTHER): Payer: Medicare Other | Admitting: Family Medicine

## 2022-05-01 VITALS — BP 124/74 | HR 67 | Temp 98.0°F | Resp 16 | Ht 65.0 in | Wt 159.0 lb

## 2022-05-01 DIAGNOSIS — E079 Disorder of thyroid, unspecified: Secondary | ICD-10-CM | POA: Diagnosis not present

## 2022-05-01 DIAGNOSIS — E039 Hypothyroidism, unspecified: Secondary | ICD-10-CM

## 2022-05-01 DIAGNOSIS — M810 Age-related osteoporosis without current pathological fracture: Secondary | ICD-10-CM

## 2022-05-01 DIAGNOSIS — E782 Mixed hyperlipidemia: Secondary | ICD-10-CM | POA: Diagnosis not present

## 2022-05-01 DIAGNOSIS — R350 Frequency of micturition: Secondary | ICD-10-CM | POA: Diagnosis not present

## 2022-05-01 DIAGNOSIS — E1140 Type 2 diabetes mellitus with diabetic neuropathy, unspecified: Secondary | ICD-10-CM

## 2022-05-01 DIAGNOSIS — I10 Essential (primary) hypertension: Secondary | ICD-10-CM

## 2022-05-01 LAB — TSH: TSH: 0.87 u[IU]/mL (ref 0.35–5.50)

## 2022-05-01 LAB — CBC WITH DIFFERENTIAL/PLATELET
Basophils Absolute: 0 10*3/uL (ref 0.0–0.1)
Basophils Relative: 0.4 % (ref 0.0–3.0)
Eosinophils Absolute: 0.1 10*3/uL (ref 0.0–0.7)
Eosinophils Relative: 1 % (ref 0.0–5.0)
HCT: 39.8 % (ref 36.0–46.0)
Hemoglobin: 13.2 g/dL (ref 12.0–15.0)
Lymphocytes Relative: 33.2 % (ref 12.0–46.0)
Lymphs Abs: 2.5 10*3/uL (ref 0.7–4.0)
MCHC: 33.2 g/dL (ref 30.0–36.0)
MCV: 97.8 fl (ref 78.0–100.0)
Monocytes Absolute: 0.5 10*3/uL (ref 0.1–1.0)
Monocytes Relative: 6.8 % (ref 3.0–12.0)
Neutro Abs: 4.4 10*3/uL (ref 1.4–7.7)
Neutrophils Relative %: 58.6 % (ref 43.0–77.0)
Platelets: 271 10*3/uL (ref 150.0–400.0)
RBC: 4.07 Mil/uL (ref 3.87–5.11)
RDW: 13.2 % (ref 11.5–15.5)
WBC: 7.5 10*3/uL (ref 4.0–10.5)

## 2022-05-01 LAB — URINALYSIS, ROUTINE W REFLEX MICROSCOPIC
Bilirubin Urine: NEGATIVE
Hgb urine dipstick: NEGATIVE
Ketones, ur: NEGATIVE
Leukocytes,Ua: NEGATIVE
Nitrite: NEGATIVE
Specific Gravity, Urine: 1.015 (ref 1.000–1.030)
Total Protein, Urine: NEGATIVE
Urine Glucose: NEGATIVE
Urobilinogen, UA: 0.2 (ref 0.0–1.0)
pH: 6 (ref 5.0–8.0)

## 2022-05-01 LAB — COMPREHENSIVE METABOLIC PANEL
ALT: 16 U/L (ref 0–35)
AST: 15 U/L (ref 0–37)
Albumin: 4.4 g/dL (ref 3.5–5.2)
Alkaline Phosphatase: 48 U/L (ref 39–117)
BUN: 22 mg/dL (ref 6–23)
CO2: 31 mEq/L (ref 19–32)
Calcium: 9.6 mg/dL (ref 8.4–10.5)
Chloride: 102 mEq/L (ref 96–112)
Creatinine, Ser: 1.03 mg/dL (ref 0.40–1.20)
GFR: 54.35 mL/min — ABNORMAL LOW (ref >60.00)
Glucose, Bld: 87 mg/dL (ref 70–99)
Potassium: 5 mEq/L (ref 3.5–5.1)
Sodium: 140 mEq/L (ref 135–145)
Total Bilirubin: 0.5 mg/dL (ref 0.2–1.2)
Total Protein: 6.7 g/dL (ref 6.0–8.3)

## 2022-05-01 LAB — LIPID PANEL
Cholesterol: 175 mg/dL (ref 0–200)
HDL: 52.7 mg/dL (ref >39.00)
LDL Cholesterol: 103 mg/dL — ABNORMAL HIGH (ref 0–99)
NonHDL: 122.28
Total CHOL/HDL Ratio: 3
Triglycerides: 97 mg/dL (ref 0.0–149.0)
VLDL: 19.4 mg/dL (ref 0.0–40.0)

## 2022-05-01 LAB — MICROALBUMIN / CREATININE URINE RATIO
Creatinine,U: 90.8 mg/dL
Microalb Creat Ratio: 0.8 mg/g (ref 0.0–30.0)
Microalb, Ur: <0.7 mg/dL (ref 0.0–1.9)

## 2022-05-01 LAB — HEMOGLOBIN A1C: Hgb A1c MFr Bld: 6.5 % (ref 4.6–6.5)

## 2022-05-01 NOTE — Assessment & Plan Note (Signed)
hgba1c acceptable, minimize simple carbs. Increase exercise as tolerated. Continue current meds 

## 2022-05-01 NOTE — Assessment & Plan Note (Signed)
Well controlled, no changes to meds. Encouraged heart healthy diet such as the DASH diet and exercise as tolerated.  °

## 2022-05-01 NOTE — Assessment & Plan Note (Deleted)
On Levothyroxine, continue to monitor 

## 2022-05-01 NOTE — Assessment & Plan Note (Signed)
Noted since increasing water intake but will check Urinalysis

## 2022-05-01 NOTE — Assessment & Plan Note (Signed)
On Levothyroxine, continue to monitor 

## 2022-05-01 NOTE — Assessment & Plan Note (Signed)
Tolerating statin, encouraged heart healthy diet, avoid trans fats, minimize simple carbs and saturated fats. Increase exercise as tolerated 

## 2022-05-01 NOTE — Progress Notes (Signed)
Subjective:   By signing my name below, I, Kellie Simmering, attest that this documentation has been prepared under the direction and in the presence of Mosie Lukes, MD., 05/01/2022.    Patient ID: Victoria Walker, female    DOB: February 24, 1951, 72 y.o.   MRN: [id number]  Chief Complaint  Patient presents with   Follow-up    Follow up   HPI Patient is in today for an office visit. She denies CP/palpitations/SOB/HA/congestion/ fever/chills/or GI symptoms.  Blood Pressure Her blood pressure is within normal range today. She reports that several days ago, she felt lightheaded but this resolved once taking her Avapro 75 mg. She continues to take this nightly. BP Readings from Last 3 Encounters:  05/01/22 124/74  03/25/22 117/70  01/09/22 115/60   Pulse Readings from Last 3 Encounters:  05/01/22 67  03/25/22 70  01/09/22 65   Urinary Frequency Patient reports having recent urinary frequency but states this is due to increasingly drinking water.   Past Medical History:  Diagnosis Date   Arthritis    "both knees" (07/02/2015)   Cancer of right breast ([place])    Claustrophobia    Dysrhythmia    HX OF FAST HEART RATE AND PALPITATIONS - METOPROLOL HAS HELPED   GERD (gastroesophageal reflux disease)    H/O hiatal hernia    H/O iritis    LEFT EYE - STATES HER EYE BECOMES RED AND VERY SENSITIVE TO LIGHT WHEN FLARE UP OF IRITIS   Heart murmur    "benign; 06/2015"   High cholesterol    History of blood transfusion    "when I had my partial hysterectomy"   History of chickenpox 09/26/2014   History of measles    History of MRSA infection    History of shingles    Hypertension    Hypothyroidism    Medicare annual wellness visit, initial 04/07/2015   Multinodular thyroid    PT HAVING TROUBLE SWALLOWING   Neuropathy    FEET   Primary localized osteoarthritis of right knee    Sciatica of right side 04/25/2015   Spinal headache    spinal headache with epidural   Total knee  replacement status, right    Type II diabetes mellitus ([place])    ORAL MEDICATION - NO INSULIN   Wears glasses     Past Surgical History:  Procedure Laterality Date   ABDOMINAL HYSTERECTOMY  1988   ABDOMINOPLASTY  1986   ANTERIOR AND POSTERIOR REPAIR  05/11/2012   Procedure: ANTERIOR (CYSTOCELE) AND POSTERIOR REPAIR (RECTOCELE);  Surgeon: Reece Packer, MD;  Location: [facility];  Service: Urology;;  with graft   BREAST BIOPSY Right    CARDIAC CATHETERIZATION  1999   COLONOSCOPY     CYSTOSCOPY WITH URETHRAL DILATATION  05/11/2012   Procedure: CYSTOSCOPY WITH URETHRAL DILATATION;  Surgeon: Reece Packer, MD;  Location: [facility];  Service: Urology;;   ESOPHAGEAL MANOMETRY N/A 10/09/2021   Procedure: ESOPHAGEAL MANOMETRY (EM);  Surgeon: Irving Copas., MD;  Location: [facility];  Service: Gastroenterology;  Laterality: N/A;   ESOPHAGOGASTRODUODENOSCOPY     HERNIA REPAIR     MASTECTOMY Right 2007   PLANTAR FASCIA SURGERY Left    THYROIDECTOMY N/A 02/23/2014   Procedure: TOTAL THYROIDECTOMY;  Surgeon: Armandina Gemma, MD;  Location: [facility];  Service: General;  Laterality: N/A;   TOTAL KNEE ARTHROPLASTY Right 07/02/2015   TOTAL KNEE ARTHROPLASTY Right 07/02/2015   Procedure: TOTAL KNEE ARTHROPLASTY;  Surgeon: Elsie Saas, MD;  Location:  [facility];  Service: Orthopedics;  Laterality: Right;   TOTAL THYROIDECTOMY  1976   removed three tumor (2 on right; 1 on left)   TUBAL LIGATION     UMBILICAL HERNIA REPAIR  1986   VAGINAL PROLAPSE REPAIR  05/11/2012   Procedure: VAGINAL VAULT SUSPENSION;  Surgeon: Reece Packer, MD;  Location: [facility];  Service: Urology;;   VULVA / PERINEUM BIOPSY     vulvar    Family History  Problem Relation Age of Onset   Hypertension Mother    Heart disease Mother        pacer, CHF   Hyperlipidemia Mother    Kidney disease Mother        1 kidney due to stone   Kidney Stones Mother    Hypertension Father    Diabetes Father    Alcohol abuse Father     Lung cancer Father        lung, psa   Diabetes Sister    Hypertension Sister    Breast cancer Sister    Diabetes Brother    Hypertension Brother    Hyperlipidemia Brother    Kidney disease Brother        dialysis   Stroke Brother    Hypertension Maternal Grandmother    Heart disease Maternal Grandmother    Stroke Maternal Grandmother    Hypertension Maternal Grandfather    Hypertension Daughter    Arthritis Son    Hyperlipidemia Sister    Hypertension Sister    Diabetes Sister    Thyroid disease Sister    Kidney disease Sister    Endometriosis Sister    Breast cancer Sister        thyroid and breast   Thyroid cancer Sister    Arthritis Sister        back pain   Hypertension Sister    Thyroid disease Sister    Hypertension Brother    Hyperlipidemia Brother    Hypertension Brother    Hyperlipidemia Brother    Benign prostatic hyperplasia Brother    Hypertension Daughter    Hyperlipidemia Daughter    Diabetes Daughter    Hypertension Maternal Aunt    Hypertension Maternal Uncle    Hypertension Paternal Aunt    Cancer Paternal Aunt    Hypertension Paternal Uncle    Colon cancer Neg Hx    Stomach cancer Neg Hx    Esophageal cancer Neg Hx    Pancreatic cancer Neg Hx    Liver disease Neg Hx    Rectal cancer Neg Hx    Inflammatory bowel disease Neg Hx     Social History   Socioeconomic History   Marital status: Married    Spouse name: Not on file   Number of children: 3   Years of education: Not on file   Highest education level: Not on file  Occupational History    Comment: Retired  Tobacco Use   Smoking status: Never   Smokeless tobacco: Never  Vaping Use   Vaping Use: Never used  Substance and Sexual Activity   Alcohol use: No    Alcohol/week: 0.0 standard drinks of alcohol   Drug use: No   Sexual activity: Not Currently    Birth control/protection: Surgical    Comment: lives with husband, retired from various jobs, diabetic diet  Other Topics  Concern   Not on file  Social History Narrative   Not on file   Social Determinants of Health   Financial Resource Strain:  Low Risk  (11/07/2020)   Overall Financial Resource Strain (CARDIA)    Difficulty of Paying Living Expenses: Not hard at all  Food Insecurity: No Food Insecurity (11/07/2020)   Hunger Vital Sign    Worried About Running Out of Food in the Last Year: Never true    Ran Out of Food in the Last Year: Never true  Transportation Needs: No Transportation Needs (11/07/2020)   PRAPARE - Hydrologist (Medical): No    Lack of Transportation (Non-Medical): No  Physical Activity: Insufficiently Active (11/07/2020)   Exercise Vital Sign    Days of Exercise per Week: 2 days    Minutes of Exercise per Session: 20 min  Stress: No Stress Concern Present (11/07/2020)   [place]    Feeling of Stress : Not at all  Social Connections: Moderately Integrated (11/07/2020)   Social Connection and Isolation Panel [NHANES]    Frequency of Communication with Friends and Family: More than three times a week    Frequency of Social Gatherings with Friends and Family: More than three times a week    Attends Religious Services: More than 4 times per year    Active Member of [employer] or Organizations: No    Attends Archivist Meetings: Never    Marital Status: Married  Human resources officer Violence: Not At Risk (11/07/2020)   Humiliation, Afraid, Rape, and Kick questionnaire    Fear of Current or Ex-Partner: No    Emotionally Abused: No    Physically Abused: No    Sexually Abused: No    Outpatient Medications Prior to Visit  Medication Sig Dispense Refill   alendronate (FOSAMAX) 70 MG tablet Take 1 tablet (70 mg total) by mouth once a week. Take with a full glass of water on an empty stomach. 12 tablet 1   aspirin 81 MG EC tablet Take 81 mg by mouth every other day. Swallow whole.      atorvastatin (LIPITOR) 40 MG tablet TAKE 1 TABLET BY MOUTH EVERY OTHER DAY, ALTERNATING WITH 1/2 TABLET 180 tablet 1   AVAPRO 75 MG tablet TAKE 1 TABLET (75 MG TOTAL) BY MOUTH DAILY. 90 tablet 1   Calcium Carbonate-Vitamin D (OS-CAL 500 + D PO)      Calcium Polycarbophil (FIBER) 625 MG TABS Take 1 tablet by mouth daily.     Carboxymethylcellul-Glycerin (REFRESH OPTIVE OP) Apply to eye.     Cholecalciferol (VITAMIN D) 2000 UNITS CAPS Take 1 capsule by mouth every evening.      Ciclopirox 1 % shampoo Apply 1(one) application(s) topically 2(two) times a week 120 mL 11   cycloSPORINE (RESTASIS) 0.05 % ophthalmic emulsion Place 1 drop into both eyes 2 (two) times daily 180 each 3   diltiazem (TIADYLT ER) 360 MG 24 hr capsule Take 1 capsule (360 mg total) by mouth daily. 90 capsule 1   doxycycline (VIBRAMYCIN) 100 MG capsule Take 1 capsule (100 mg total) by mouth 2 (two) times daily. 14 capsule 0   Estradiol 10 MCG TABS vaginal tablet INSERT 1 TAB VAGINALLY AT BEDTIME EVERY OTHER DAY 48 tablet 4   fluconazole (DIFLUCAN) 150 MG tablet Take 1 tablet (150 mg total) by mouth once a week. 2 tablet 0   gabapentin (NEURONTIN) 300 MG capsule Take 1 capsule (300 mg total) by mouth at bedtime. 90 capsule 1   glucose blood (ONE TOUCH TEST STRIPS) test strip Use as directed twice daily to check  blood sugar. DX code E11.9 300 each 1   levocetirizine (XYZAL) 5 MG tablet TAKE 1 TABLET (5 MG TOTAL) BY MOUTH EVERY EVENING. 90 tablet 3   levothyroxine (SYNTHROID) 88 MCG tablet TAKE 1 TABLET BY MOUTH ONCE DAILY **SKIP SUNDAY** 90 tablet 1   loteprednol (LOTEMAX) 0.5 % ophthalmic suspension Place one drop into both eyes four times daily with flare up. 10 mL 3   metFORMIN (GLUCOPHAGE-XR) 750 MG 24 hr tablet Take 1 tablet (750 mg total) by mouth in the morning and at bedtime. 180 tablet 1   metoprolol succinate (TOPROL-XL) 25 MG 24 hr tablet Take 1 tablet (25 mg total) by mouth 2 (two) times daily. 180 tablet 1   mometasone  (NASONEX) 50 MCG/ACT nasal spray Place 1-2 sprays in each nostril daily as needed for stuffy nose. 51 g 1   NEXIUM 40 MG capsule Take 1 capsule (40 mg total) by mouth daily. 90 capsule 1   Probiotic Product (PROBIOTIC DAILY PO) Take 1 capsule by mouth daily.     spironolactone (ALDACTONE) 25 MG tablet Take 1.5 tablets (37.5 mg total) by mouth daily. 135 tablet 1   COVID-19 mRNA vaccine 2023-2024 (COMIRNATY) syringe Inject into the muscle. 0.3 mL 0   No facility-administered medications prior to visit.    Allergies  Allergen Reactions   Codeine Anxiety    Jittery    Penicillins Itching   Sulfa Antibiotics Itching   Vicodin [Hydrocodone-Acetaminophen] Itching    ITCHING     Review of Systems  Constitutional:  Negative for chills and fever.  HENT:  Negative for congestion.   Respiratory:  Negative for shortness of breath.   Cardiovascular:  Negative for chest pain and palpitations.  Gastrointestinal:  Negative for abdominal pain, blood in stool, constipation, diarrhea, nausea and vomiting.  Genitourinary:  Positive for frequency.  Skin:           Neurological:  Negative for headaches.       Objective:    Physical Exam Constitutional:      General: She is not in acute distress.    Appearance: Normal appearance. She is normal weight. She is not ill-appearing.  HENT:     Head: Normocephalic and atraumatic.     Right Ear: External ear normal.     Left Ear: External ear normal.     Nose: Nose normal.     Mouth/Throat:     Mouth: Mucous membranes are moist.     Pharynx: Oropharynx is clear.  Eyes:     General:        Right eye: No discharge.        Left eye: No discharge.     Extraocular Movements: Extraocular movements intact.     Conjunctiva/sclera: Conjunctivae normal.     Pupils: Pupils are equal, round, and reactive to light.  Cardiovascular:     Rate and Rhythm: Normal rate and regular rhythm.     Pulses: Normal pulses.     Heart sounds: Normal heart sounds. No  murmur heard.    No gallop.  Pulmonary:     Effort: Pulmonary effort is normal. No respiratory distress.     Breath sounds: Normal breath sounds. No wheezing or rales.  Abdominal:     General: Bowel sounds are normal.     Palpations: Abdomen is soft.     Tenderness: There is no abdominal tenderness. There is no guarding.  Musculoskeletal:        General: Normal range of motion.  Cervical back: Normal range of motion.     Right lower leg: No edema.     Left lower leg: No edema.  Skin:    General: Skin is warm and dry.  Neurological:     Mental Status: She is alert and oriented to person, place, and time.  Psychiatric:        Mood and Affect: Mood normal.        Behavior: Behavior normal.        Judgment: Judgment normal.     BP 124/74 (BP Location: Right Arm, Patient Position: Sitting, Cuff Size: Normal)   Pulse 67   Temp 98 F (36.7 C) (Oral)   Resp 16   Ht '5\' 5"'$  (1.651 m)   Wt 159 lb (72.1 kg)   SpO2 97%   BMI 26.46 kg/m  Wt Readings from Last 3 Encounters:  05/01/22 159 lb (72.1 kg)  01/09/22 161 lb 6.4 oz (73.2 kg)  01/08/22 161 lb 2 oz (73.1 kg)    Diabetic Foot Exam - Simple   No data filed    Lab Results  Component Value Date   WBC 7.2 01/09/2022   HGB 12.7 01/09/2022   HCT 38.7 01/09/2022   PLT 278.0 01/09/2022   GLUCOSE 90 01/09/2022   CHOL 169 07/08/2021   TRIG 120.0 07/08/2021   HDL 54.80 07/08/2021   LDLDIRECT 107.0 07/01/2016   LDLCALC 91 07/08/2021   ALT 15 01/09/2022   AST 13 01/09/2022   NA 138 01/09/2022   K 4.7 01/09/2022   CL 102 01/09/2022   CREATININE 0.99 01/09/2022   BUN 15 01/09/2022   CO2 31 01/09/2022   TSH 0.83 01/09/2022   INR 1.01 06/21/2015   HGBA1C 6.6 (H) 12/03/2021   MICROALBUR <0.7 08/02/2018    Lab Results  Component Value Date   TSH 0.83 01/09/2022   Lab Results  Component Value Date   WBC 7.2 01/09/2022   HGB 12.7 01/09/2022   HCT 38.7 01/09/2022   MCV 98.3 01/09/2022   PLT 278.0 01/09/2022   Lab  Results  Component Value Date   NA 138 01/09/2022   K 4.7 01/09/2022   CO2 31 01/09/2022   GLUCOSE 90 01/09/2022   BUN 15 01/09/2022   CREATININE 0.99 01/09/2022   BILITOT 0.3 01/09/2022   ALKPHOS 47 01/09/2022   AST 13 01/09/2022   ALT 15 01/09/2022   PROT 6.4 01/09/2022   ALBUMIN 4.0 01/09/2022   CALCIUM 9.1 01/09/2022   ANIONGAP 9 03/13/2021   GFR 57.12 (L) 01/09/2022   Lab Results  Component Value Date   CHOL 169 07/08/2021   Lab Results  Component Value Date   HDL 54.80 07/08/2021   Lab Results  Component Value Date   LDLCALC 91 07/08/2021   Lab Results  Component Value Date   TRIG 120.0 07/08/2021   Lab Results  Component Value Date   CHOLHDL 3 07/08/2021   Lab Results  Component Value Date   HGBA1C 6.6 (H) 12/03/2021      Assessment & Plan:  Healthy Lifestyle: Encouraged heart healthy diet and hydration.  Immunizations: Reviewed patient's immunization history and encouraged RSV immunization.  Labs: Routine blood work and a urinary analysis will be completed today. Problem List Items Addressed This Visit     Type 2 diabetes mellitus with diabetic neuropathy, without long-term current use of insulin (HCC)    hgba1c acceptable, minimize simple carbs. Increase exercise as tolerated. Continue current meds      Relevant  Orders   HgB A1c   Microalbumin / creatinine urine ratio   Essential hypertension - Primary    Well controlled, no changes to meds. Encouraged heart healthy diet such as the DASH diet and exercise as tolerated.       Relevant Orders   CBC with Differential/Platelet   Comprehensive metabolic panel   TSH   Hyperlipidemia, mixed    Tolerating statin, encouraged heart healthy diet, avoid trans fats, minimize simple carbs and saturated fats. Increase exercise as tolerated      Relevant Orders   Lipid panel   Osteoporosis    Encouraged to get adequate exercise, calcium and vitamin d intake       Hypothyroid   Disease of thyroid  gland    On Levothyroxine, continue to monitor       Urinary frequency    Noted since increasing water intake but will check Urinalysis      Relevant Orders   Urinalysis, Routine w reflex microscopic   No orders of the defined types were placed in this encounter.  I, Penni Homans, MD, personally preformed the services described in this documentation.  All medical record entries made by the scribe were at my direction and in my presence.  I have reviewed the chart and discharge instructions (if applicable) and agree that the record reflects my personal performance and is accurate and complete. 05/01/2022  I,Mohammed Iqbal,acting as a scribe for Penni Homans, MD.,have documented all relevant documentation on the behalf of Penni Homans, MD,as directed by  Penni Homans, MD while in the presence of Penni Homans, MD.  Penni Homans, MD

## 2022-05-01 NOTE — Assessment & Plan Note (Signed)
Encouraged to get adequate exercise, calcium and vitamin d intake 

## 2022-05-01 NOTE — Patient Instructions (Addendum)
RSV, Respiratory syncitial Virus, Arexvy at pharmacy  Hypertension, Adult High blood pressure (hypertension) is when the force of blood pumping through the arteries is too strong. The arteries are the blood vessels that carry blood from the heart throughout the body. Hypertension forces the heart to work harder to pump blood and may cause arteries to become narrow or stiff. Untreated or uncontrolled hypertension can lead to a heart attack, heart failure, a stroke, kidney disease, and other problems. A blood pressure reading consists of a higher number over a lower number. Ideally, your blood pressure should be below 120/80. The first ("top") number is called the systolic pressure. It is a measure of the pressure in your arteries as your heart beats. The second ("bottom") number is called the diastolic pressure. It is a measure of the pressure in your arteries as the heart relaxes. What are the causes? The exact cause of this condition is not known. There are some conditions that result in high blood pressure. What increases the risk? Certain factors may make you more likely to develop high blood pressure. Some of these risk factors are under your control, including: Smoking. Not getting enough exercise or physical activity. Being overweight. Having too much fat, sugar, calories, or salt (sodium) in your diet. Drinking too much alcohol. Other risk factors include: Having a personal history of heart disease, diabetes, high cholesterol, or kidney disease. Stress. Having a family history of high blood pressure and high cholesterol. Having obstructive sleep apnea. Age. The risk increases with age. What are the signs or symptoms? High blood pressure may not cause symptoms. Very high blood pressure (hypertensive crisis) may cause: Headache. Fast or irregular heartbeats (palpitations). Shortness of breath. Nosebleed. Nausea and vomiting. Vision changes. Severe chest pain, dizziness, and  seizures. How is this diagnosed? This condition is diagnosed by measuring your blood pressure while you are seated, with your arm resting on a flat surface, your legs uncrossed, and your feet flat on the floor. The cuff of the blood pressure monitor will be placed directly against the skin of your upper arm at the level of your heart. Blood pressure should be measured at least twice using the same arm. Certain conditions can cause a difference in blood pressure between your right and left arms. If you have a high blood pressure reading during one visit or you have normal blood pressure with other risk factors, you may be asked to: Return on a different day to have your blood pressure checked again. Monitor your blood pressure at home for 1 week or longer. If you are diagnosed with hypertension, you may have other blood or imaging tests to help your health care provider understand your overall risk for other conditions. How is this treated? This condition is treated by making healthy lifestyle changes, such as eating healthy foods, exercising more, and reducing your alcohol intake. You may be referred for counseling on a healthy diet and physical activity. Your health care provider may prescribe medicine if lifestyle changes are not enough to get your blood pressure under control and if: Your systolic blood pressure is above 130. Your diastolic blood pressure is above 80. Your personal target blood pressure may vary depending on your medical conditions, your age, and other factors. Follow these instructions at home: Eating and drinking  Eat a diet that is high in fiber and potassium, and low in sodium, added sugar, and fat. An example of this eating plan is called the DASH diet. DASH stands for Dietary Approaches  to Stop Hypertension. To eat this way: Eat plenty of fresh fruits and vegetables. Try to fill one half of your plate at each meal with fruits and vegetables. Eat whole grains, such as  whole-wheat pasta, brown rice, or whole-grain bread. Fill about one fourth of your plate with whole grains. Eat or drink low-fat dairy products, such as skim milk or low-fat yogurt. Avoid fatty cuts of meat, processed or cured meats, and poultry with skin. Fill about one fourth of your plate with lean proteins, such as fish, chicken without skin, beans, eggs, or tofu. Avoid pre-made and processed foods. These tend to be higher in sodium, added sugar, and fat. Reduce your daily sodium intake. Many people with hypertension should eat less than 1,500 mg of sodium a day. Do not drink alcohol if: Your health care provider tells you not to drink. You are pregnant, may be pregnant, or are planning to become pregnant. If you drink alcohol: Limit how much you have to: 0-1 drink a day for women. 0-2 drinks a day for men. Know how much alcohol is in your drink. In the [place] one drink equals one 12 oz bottle of beer (355 mL), one 5 oz glass of wine (148 mL), or one 1 oz glass of hard liquor (44 mL). Lifestyle  Work with your health care provider to maintain a healthy body weight or to lose weight. Ask what an ideal weight is for you. Get at least 30 minutes of exercise that causes your heart to beat faster (aerobic exercise) most days of the week. Activities may include walking, swimming, or biking. Include exercise to strengthen your muscles (resistance exercise), such as Pilates or lifting weights, as part of your weekly exercise routine. Try to do these types of exercises for 30 minutes at least 3 days a week. Do not use any products that contain nicotine or tobacco. These products include cigarettes, chewing tobacco, and vaping devices, such as e-cigarettes. If you need help quitting, ask your health care provider. Monitor your blood pressure at home as told by your health care provider. Keep all follow-up visits. This is important. Medicines Take over-the-counter and prescription medicines only as  told by your health care provider. Follow directions carefully. Blood pressure medicines must be taken as prescribed. Do not skip doses of blood pressure medicine. Doing this puts you at risk for problems and can make the medicine less effective. Ask your health care provider about side effects or reactions to medicines that you should watch for. Contact a health care provider if you: Think you are having a reaction to a medicine you are taking. Have headaches that keep coming back (recurring). Feel dizzy. Have swelling in your ankles. Have trouble with your vision. Get help right away if you: Develop a severe headache or confusion. Have unusual weakness or numbness. Feel faint. Have severe pain in your chest or abdomen. Vomit repeatedly. Have trouble breathing. These symptoms may be an emergency. Get help right away. Call 911. Do not wait to see if the symptoms will go away. Do not drive yourself to the hospital. Summary Hypertension is when the force of blood pumping through your arteries is too strong. If this condition is not controlled, it may put you at risk for serious complications. Your personal target blood pressure may vary depending on your medical conditions, your age, and other factors. For most people, a normal blood pressure is less than 120/80. Hypertension is treated with lifestyle changes, medicines, or a combination of both.  Lifestyle changes include losing weight, eating a healthy, low-sodium diet, exercising more, and limiting alcohol. This information is not intended to replace advice given to you by your health care provider. Make sure you discuss any questions you have with your health care provider. Document Revised: 02/12/2021 Document Reviewed: 02/12/2021 Elsevier Patient Education  [employer].

## 2022-05-02 ENCOUNTER — Other Ambulatory Visit (HOSPITAL_BASED_OUTPATIENT_CLINIC_OR_DEPARTMENT_OTHER): Payer: Self-pay

## 2022-05-05 ENCOUNTER — Telehealth: Payer: Self-pay | Admitting: *Deleted

## 2022-05-05 ENCOUNTER — Telehealth: Payer: Self-pay

## 2022-05-05 NOTE — Telephone Encounter (Signed)
Patient returned call. Schedule 6 month FU

## 2022-05-05 NOTE — Telephone Encounter (Signed)
-----  Message from Stevan Born, [place] sent at 01/08/2022 12:38 PM EDT ----- Regarding: March 2024 6 month, GM, dx hx of hpylori, dysphagia, GERD

## 2022-05-05 NOTE — Telephone Encounter (Signed)
Left message for patient to return call to schedule a 6 month follow up with Dr Rush Landmark.  Will continue efforts.

## 2022-05-05 NOTE — Telephone Encounter (Signed)
Pt called this afternoon concerned that her GFR has been low several times.  She stated that kidney disease runs in her family and she wants to know if she needs to see a kidney specialist.  She also wants to know if she can come back to leave another urine specimen for a culture.  Please advise.

## 2022-05-06 ENCOUNTER — Ambulatory Visit
Admission: RE | Admit: 2022-05-06 | Discharge: 2022-05-06 | Disposition: A | Discharge status: Home / Self Care | Payer: Medicare Other | Source: Ambulatory Visit | Attending: Urgent Care | Admitting: Urgent Care

## 2022-05-06 ENCOUNTER — Other Ambulatory Visit (HOSPITAL_BASED_OUTPATIENT_CLINIC_OR_DEPARTMENT_OTHER): Payer: Self-pay

## 2022-05-06 ENCOUNTER — Other Ambulatory Visit: Payer: Self-pay | Admitting: *Deleted

## 2022-05-06 VITALS — BP 118/74 | HR 67 | Temp 97.9°F | Resp 16

## 2022-05-06 DIAGNOSIS — R350 Frequency of micturition: Secondary | ICD-10-CM | POA: Insufficient documentation

## 2022-05-06 DIAGNOSIS — N3000 Acute cystitis without hematuria: Secondary | ICD-10-CM | POA: Diagnosis not present

## 2022-05-06 DIAGNOSIS — B3731 Acute candidiasis of vulva and vagina: Secondary | ICD-10-CM | POA: Diagnosis not present

## 2022-05-06 LAB — POCT URINALYSIS DIP (MANUAL ENTRY)
Bilirubin, UA: NEGATIVE
Blood, UA: NEGATIVE
Glucose, UA: NEGATIVE mg/dL
Ketones, POC UA: NEGATIVE mg/dL
Leukocytes, UA: NEGATIVE
Nitrite, UA: NEGATIVE
Protein Ur, POC: NEGATIVE mg/dL
Spec Grav, UA: 1.015 (ref 1.010–1.025)
Urobilinogen, UA: 0.2 E.U./dL
pH, UA: 6 (ref 5.0–8.0)

## 2022-05-06 MED ORDER — FLUCONAZOLE 150 MG PO TABS
150.0000 mg | ORAL_TABLET | ORAL | 0 refills | Status: DC
  Filled 2022-05-06: qty 2, 14d supply, fill #0

## 2022-05-06 MED ORDER — AREXVY 120 MCG/0.5ML IM SUSR
INTRAMUSCULAR | 0 refills | Status: DC
Start: 2022-05-06 — End: 2022-12-31
  Filled 2022-05-06: qty 0.5, 1d supply, fill #0

## 2022-05-06 MED ORDER — CEPHALEXIN 500 MG PO CAPS
500.0000 mg | ORAL_CAPSULE | Freq: Two times a day (BID) | ORAL | 0 refills | Status: DC
  Filled 2022-05-06: qty 10, 5d supply, fill #0

## 2022-05-06 NOTE — ED Triage Notes (Signed)
Pt c/o bladder pressure and frequent urination that began about 4 days ago.

## 2022-05-06 NOTE — Discharge Instructions (Signed)
Please start Keflex to address an urinary tract infection. Start fluconazole for yeast infection which you can also get from taking antibiotics. Make sure you hydrate very well with plain water and a quantity of 64 ounces of water a day.  Please limit drinks that are considered urinary irritants such as soda, sweet tea, coffee, energy drinks, alcohol.  These can worsen your urinary and genital symptoms but also be the source of them.  I will let you know about your urine culture results through MyChart to see if we need to prescribe or change your antibiotics based off of those results.

## 2022-05-06 NOTE — ED Provider Notes (Signed)
[facility]  Note:  This document was prepared using Systems analyst and may include unintentional dictation errors.  MRN: [id number] DOB: 30-Sep-1950  Subjective:   Victoria Walker is a 72 y.o. female presenting for 4-day history of acute onset urinary frequency, pelvic pressure, urinary irritation.  Patient also has burning sensation at the vaginal skin.  No discharge, fever, nausea, vomiting, abdominal or pelvic pain, hematuria.  Patient hydrates very well consistently.  Drinks about 4-5 bottles of water a day.  Also limits all urinary irritants.  Has a history of UTIs and yeast infections.  No current facility-administered medications for this encounter.  Current Outpatient Medications:    alendronate (FOSAMAX) 70 MG tablet, Take 1 tablet (70 mg total) by mouth once a week. Take with a full glass of water on an empty stomach., Disp: 12 tablet, Rfl: 1   aspirin 81 MG EC tablet, Take 81 mg by mouth every other day. Swallow whole., Disp: , Rfl:    atorvastatin (LIPITOR) 40 MG tablet, TAKE 1 TABLET BY MOUTH EVERY OTHER DAY, ALTERNATING WITH 1/2 TABLET, Disp: 180 tablet, Rfl: 1   AVAPRO 75 MG tablet, TAKE 1 TABLET (75 MG TOTAL) BY MOUTH DAILY., Disp: 90 tablet, Rfl: 1   Calcium Carbonate-Vitamin D (OS-CAL 500 + D PO), , Disp: , Rfl:    Calcium Polycarbophil (FIBER) 625 MG TABS, Take 1 tablet by mouth daily., Disp: , Rfl:    Carboxymethylcellul-Glycerin (REFRESH OPTIVE OP), Apply to eye., Disp: , Rfl:    Cholecalciferol (VITAMIN D) 2000 UNITS CAPS, Take 1 capsule by mouth every evening. , Disp: , Rfl:    Ciclopirox 1 % shampoo, Apply 1(one) application(s) topically 2(two) times a week, Disp: 120 mL, Rfl: 11   cycloSPORINE (RESTASIS) 0.05 % ophthalmic emulsion, Place 1 drop into both eyes 2 (two) times daily, Disp: 180 each, Rfl: 3   diltiazem (TIADYLT ER) 360 MG 24 hr capsule, Take 1 capsule (360 mg total) by mouth daily., Disp: 90 capsule, Rfl: 1    doxycycline (VIBRAMYCIN) 100 MG capsule, Take 1 capsule (100 mg total) by mouth 2 (two) times daily., Disp: 14 capsule, Rfl: 0   Estradiol 10 MCG TABS vaginal tablet, INSERT 1 TAB VAGINALLY AT BEDTIME EVERY OTHER DAY, Disp: 48 tablet, Rfl: 4   fluconazole (DIFLUCAN) 150 MG tablet, Take 1 tablet (150 mg total) by mouth once a week., Disp: 2 tablet, Rfl: 0   gabapentin (NEURONTIN) 300 MG capsule, Take 1 capsule (300 mg total) by mouth at bedtime., Disp: 90 capsule, Rfl: 1   glucose blood (ONE TOUCH TEST STRIPS) test strip, Use as directed twice daily to check blood sugar. DX code E11.9, Disp: 300 each, Rfl: 1   levocetirizine (XYZAL) 5 MG tablet, TAKE 1 TABLET (5 MG TOTAL) BY MOUTH EVERY EVENING., Disp: 90 tablet, Rfl: 3   levothyroxine (SYNTHROID) 88 MCG tablet, TAKE 1 TABLET BY MOUTH ONCE DAILY **SKIP SUNDAY**, Disp: 90 tablet, Rfl: 1   loteprednol (LOTEMAX) 0.5 % ophthalmic suspension, Place one drop into both eyes four times daily with flare up., Disp: 10 mL, Rfl: 3   metFORMIN (GLUCOPHAGE-XR) 750 MG 24 hr tablet, Take 1 tablet (750 mg total) by mouth in the morning and at bedtime., Disp: 180 tablet, Rfl: 1   metoprolol succinate (TOPROL-XL) 25 MG 24 hr tablet, Take 1 tablet (25 mg total) by mouth 2 (two) times daily., Disp: 180 tablet, Rfl: 1   mometasone (NASONEX) 50 MCG/ACT nasal spray, Place  1-2 sprays in each nostril daily as needed for stuffy nose., Disp: 51 g, Rfl: 1   NEXIUM 40 MG capsule, Take 1 capsule (40 mg total) by mouth daily., Disp: 90 capsule, Rfl: 1   Probiotic Product (PROBIOTIC DAILY PO), Take 1 capsule by mouth daily., Disp: , Rfl:    RSV vaccine recomb adjuvanted (AREXVY) 120 MCG/0.5ML injection, Inject into the muscle., Disp: 0.5 mL, Rfl: 0   spironolactone (ALDACTONE) 25 MG tablet, Take 1.5 tablets (37.5 mg total) by mouth daily., Disp: 135 tablet, Rfl: 1   Allergies  Allergen Reactions   Codeine Anxiety    Jittery    Penicillins Itching   Sulfa Antibiotics Itching    Vicodin [Hydrocodone-Acetaminophen] Itching    ITCHING     Past Medical History:  Diagnosis Date   Arthritis    "both knees" (07/02/2015)   Cancer of right breast (HCC)    Claustrophobia    Dysrhythmia    HX OF FAST HEART RATE AND PALPITATIONS - METOPROLOL HAS HELPED   GERD (gastroesophageal reflux disease)    H/O hiatal hernia    H/O iritis    LEFT EYE - STATES HER EYE BECOMES RED AND VERY SENSITIVE TO LIGHT WHEN FLARE UP OF IRITIS   Heart murmur    "benign; 06/2015"   High cholesterol    History of blood transfusion    "when I had my partial hysterectomy"   History of chickenpox 09/26/2014   History of measles    History of MRSA infection    History of shingles    Hypertension    Hypothyroidism    Medicare annual wellness visit, initial 04/07/2015   Multinodular thyroid    PT HAVING TROUBLE SWALLOWING   Neuropathy    FEET   Primary localized osteoarthritis of right knee    Sciatica of right side 04/25/2015   Spinal headache    spinal headache with epidural   Total knee replacement status, right    Type II diabetes mellitus ([place])    ORAL MEDICATION - NO INSULIN   Wears glasses      Past Surgical History:  Procedure Laterality Date   ABDOMINAL HYSTERECTOMY  1988   ABDOMINOPLASTY  1986   ANTERIOR AND POSTERIOR REPAIR  05/11/2012   Procedure: ANTERIOR (CYSTOCELE) AND POSTERIOR REPAIR (RECTOCELE);  Surgeon: Reece Packer, MD;  Location: [facility];  Service: Urology;;  with graft   BREAST BIOPSY Right    CARDIAC CATHETERIZATION  1999   COLONOSCOPY     CYSTOSCOPY WITH URETHRAL DILATATION  05/11/2012   Procedure: CYSTOSCOPY WITH URETHRAL DILATATION;  Surgeon: Reece Packer, MD;  Location: [facility];  Service: Urology;;   ESOPHAGEAL MANOMETRY N/A 10/09/2021   Procedure: ESOPHAGEAL MANOMETRY (EM);  Surgeon: Irving Copas., MD;  Location: [facility];  Service: Gastroenterology;  Laterality: N/A;   ESOPHAGOGASTRODUODENOSCOPY     HERNIA REPAIR     MASTECTOMY Right  2007   PLANTAR FASCIA SURGERY Left    THYROIDECTOMY N/A 02/23/2014   Procedure: TOTAL THYROIDECTOMY;  Surgeon: Armandina Gemma, MD;  Location: [facility];  Service: General;  Laterality: N/A;   TOTAL KNEE ARTHROPLASTY Right 07/02/2015   TOTAL KNEE ARTHROPLASTY Right 07/02/2015   Procedure: TOTAL KNEE ARTHROPLASTY;  Surgeon: Elsie Saas, MD;  Location: [facility];  Service: Orthopedics;  Laterality: Right;   TOTAL THYROIDECTOMY  1976   removed three tumor (2 on right; 1 on left)   [place]  VAGINAL PROLAPSE REPAIR  05/11/2012   Procedure: VAGINAL VAULT SUSPENSION;  Surgeon: Reece Packer, MD;  Location: [facility];  Service: Urology;;   VULVA / PERINEUM BIOPSY     vulvar    Family History  Problem Relation Age of Onset   Hypertension Mother    Heart disease Mother        pacer, CHF   Hyperlipidemia Mother    Kidney disease Mother        1 kidney due to stone   Kidney Stones Mother    Hypertension Father    Diabetes Father    Alcohol abuse Father    Lung cancer Father        lung, psa   Diabetes Sister    Hypertension Sister    Breast cancer Sister    Diabetes Brother    Hypertension Brother    Hyperlipidemia Brother    Kidney disease Brother        dialysis   Stroke Brother    Hypertension Maternal Grandmother    Heart disease Maternal Grandmother    Stroke Maternal Grandmother    Hypertension Maternal Grandfather    Hypertension Daughter    Arthritis Son    Hyperlipidemia Sister    Hypertension Sister    Diabetes Sister    Thyroid disease Sister    Kidney disease Sister    Endometriosis Sister    Breast cancer Sister        thyroid and breast   Thyroid cancer Sister    Arthritis Sister        back pain   Hypertension Sister    Thyroid disease Sister    Hypertension Brother    Hyperlipidemia Brother    Hypertension Brother    Hyperlipidemia Brother    Benign prostatic hyperplasia Brother    Hypertension Daughter     Hyperlipidemia Daughter    Diabetes Daughter    Hypertension Maternal Aunt    Hypertension Maternal Uncle    Hypertension Paternal Aunt    Cancer Paternal Aunt    Hypertension Paternal Uncle    Colon cancer Neg Hx    Stomach cancer Neg Hx    Esophageal cancer Neg Hx    Pancreatic cancer Neg Hx    Liver disease Neg Hx    Rectal cancer Neg Hx    Inflammatory bowel disease Neg Hx     Social History   Tobacco Use   Smoking status: Never   Smokeless tobacco: Never  Vaping Use   Vaping Use: Never used  Substance Use Topics   Alcohol use: No    Alcohol/week: 0.0 standard drinks of alcohol   Drug use: No    ROS   Objective:   Vitals: BP 118/74 (BP Location: Left Arm)   Pulse 67   Temp 97.9 F (36.6 C) (Oral)   Resp 16   SpO2 98%   Physical Exam Constitutional:      General: She is not in acute distress.    Appearance: Normal appearance. She is well-developed. She is not ill-appearing, toxic-appearing or diaphoretic.  HENT:     Head: Normocephalic and atraumatic.     Nose: Nose normal.     Mouth/Throat:     Mouth: Mucous membranes are moist.  Eyes:     General: No scleral icterus.       Right eye: No discharge.        Left eye: No discharge.     Extraocular Movements: Extraocular movements intact.  Conjunctiva/sclera: Conjunctivae normal.  Cardiovascular:     Rate and Rhythm: Normal rate.  Pulmonary:     Effort: Pulmonary effort is normal.  Abdominal:     General: Bowel sounds are normal. There is no distension.     Palpations: Abdomen is soft. There is no mass.     Tenderness: There is no abdominal tenderness. There is no right CVA tenderness, left CVA tenderness, guarding or rebound.  Skin:    General: Skin is warm and dry.  Neurological:     General: No focal deficit present.     Mental Status: She is alert and oriented to person, place, and time.  Psychiatric:        Mood and Affect: Mood normal.        Behavior: Behavior normal.        Thought  Content: Thought content normal.        Judgment: Judgment normal.     Results for orders placed or performed during the hospital encounter of 05/06/22 (from the past 24 hour(s))  POCT urinalysis dipstick     Status: None   Collection Time: 05/06/22  3:21 PM  Result Value Ref Range   Color, UA yellow yellow   Clarity, UA clear clear   Glucose, UA negative negative mg/dL   Bilirubin, UA negative negative   Ketones, POC UA negative negative mg/dL   Spec Grav, UA 1.015 1.010 - 1.025   Blood, UA negative negative   pH, UA 6.0 5.0 - 8.0   Protein Ur, POC negative negative mg/dL   Urobilinogen, UA 0.2 0.2 or 1.0 E.U./dL   Nitrite, UA Negative Negative   Leukocytes, UA Negative Negative    Assessment and Plan :   PDMP not reviewed this encounter.  1. Acute cystitis without hematuria   2. Urinary frequency   3. Yeast vaginitis     Creatinine Clearance calculator at 46m/min. Start Keflex to cover for acute cystitis given her history, urine culture pending.  Recommended continued hydration, limiting urinary irritants.  Will also use fluconazole for recurrent yeast vaginitis.  Counseled patient on potential for adverse effects with medications prescribed/recommended today, ER and return-to-clinic precautions discussed, patient verbalized understanding.    MJaynee Eagles [place]01/16/24 [phone]

## 2022-05-07 ENCOUNTER — Other Ambulatory Visit: Payer: Self-pay | Admitting: Family Medicine

## 2022-05-07 DIAGNOSIS — N289 Disorder of kidney and ureter, unspecified: Secondary | ICD-10-CM

## 2022-05-07 LAB — CERVICOVAGINAL ANCILLARY ONLY
Bacterial Vaginitis (gardnerella): NEGATIVE
Candida Glabrata: NEGATIVE
Candida Vaginitis: NEGATIVE
Chlamydia: NEGATIVE
Comment: NEGATIVE
Comment: NEGATIVE
Comment: NEGATIVE
Comment: NEGATIVE
Comment: NEGATIVE
Comment: NORMAL
Neisseria Gonorrhea: NEGATIVE
Trichomonas: NEGATIVE

## 2022-05-08 LAB — URINE CULTURE: Culture: 10000 — AB

## 2022-05-12 ENCOUNTER — Other Ambulatory Visit (HOSPITAL_BASED_OUTPATIENT_CLINIC_OR_DEPARTMENT_OTHER): Payer: Self-pay

## 2022-05-20 ENCOUNTER — Other Ambulatory Visit: Payer: Self-pay | Admitting: Internal Medicine

## 2022-05-20 ENCOUNTER — Other Ambulatory Visit (HOSPITAL_BASED_OUTPATIENT_CLINIC_OR_DEPARTMENT_OTHER): Payer: Self-pay

## 2022-05-20 ENCOUNTER — Other Ambulatory Visit: Payer: Self-pay | Admitting: Family Medicine

## 2022-05-20 MED ORDER — MOMETASONE FUROATE 50 MCG/ACT NA SUSP
1.0000 | Freq: Every day | NASAL | 0 refills | Status: DC | PRN
  Filled 2022-05-20: qty 51, 90d supply, fill #0

## 2022-05-20 MED ORDER — LEVOTHYROXINE SODIUM 88 MCG PO TABS
88.0000 ug | ORAL_TABLET | Freq: Every day | ORAL | 11 refills | Status: DC
  Filled 2022-05-20: qty 30, 30d supply, fill #0
  Filled 2022-08-19: qty 30, 30d supply, fill #1

## 2022-05-20 MED ORDER — AVAPRO 75 MG PO TABS
75.0000 mg | ORAL_TABLET | Freq: Every day | ORAL | 1 refills | Status: DC
  Filled 2022-05-20: qty 90, 90d supply, fill #0
  Filled 2022-08-19: qty 90, 90d supply, fill #1

## 2022-05-21 ENCOUNTER — Other Ambulatory Visit (HOSPITAL_BASED_OUTPATIENT_CLINIC_OR_DEPARTMENT_OTHER): Payer: Self-pay

## 2022-05-26 DIAGNOSIS — E1140 Type 2 diabetes mellitus with diabetic neuropathy, unspecified: Secondary | ICD-10-CM | POA: Diagnosis not present

## 2022-06-09 ENCOUNTER — Telehealth: Payer: Self-pay | Admitting: Family Medicine

## 2022-06-09 NOTE — Telephone Encounter (Signed)
Pt states her bp has been running low for the past couple weeks and was not sure if it is her meds or her bp cuff. She stated in the morning it ranges from 110/75-100/65 with a bit lightheadedness. The bp meds make her bp low but when she doesn't taker her bp meds her heart feels a bit jittery. Scheduled her tomorrow to see Lenna Sciara and she declined triaged but stated she would call us if anything gets worse.

## 2022-06-09 NOTE — Telephone Encounter (Signed)
Called pt regarding Bp and medication, Pt stated she had change how she been  Taking Bp Medication and was advised need to take medication as prescribe from provider and need to discuss with provider.  Pt stated understand and see Melissa tomorrow, to bring Bp cuff into visit

## 2022-06-09 NOTE — Progress Notes (Signed)
Subjective:   By signing my name below, I, Victoria Walker, attest that this documentation has been prepared under the direction and in the presence of Victoria Pear, NP 06/10/22   Patient ID: Victoria Walker, female    DOB: 25-Aug-1950, 72 y.o.   MRN: [id number]  Chief Complaint  Patient presents with   Hypertension    Here for follow up     HPI Patient is in today for an office visit.  Hypotension: She has been monitoring her blood pressure at home. She reports her blood pressure has ran low in the 100-110s/70s, mainly in the mornings. She endorses dizziness when her blood pressure runs low. She reports she has occasionally missed a dose of Spironolactone.    Past Medical History:  Diagnosis Date   Arthritis    "both knees" (07/02/2015)   Cancer of right breast ([place])    Claustrophobia    Dysrhythmia    HX OF FAST HEART RATE AND PALPITATIONS - METOPROLOL HAS HELPED   GERD (gastroesophageal reflux disease)    H/O hiatal hernia    H/O iritis    LEFT EYE - STATES HER EYE BECOMES RED AND VERY SENSITIVE TO LIGHT WHEN FLARE UP OF IRITIS   Heart murmur    "benign; 06/2015"   High cholesterol    History of blood transfusion    "when I had my partial hysterectomy"   History of chickenpox 09/26/2014   History of measles    History of MRSA infection    History of shingles    Hypertension    Hypothyroidism    Medicare annual wellness visit, initial 04/07/2015   Multinodular thyroid    PT HAVING TROUBLE SWALLOWING   Neuropathy    FEET   Primary localized osteoarthritis of right knee    Sciatica of right side 04/25/2015   Spinal headache    spinal headache with epidural   Total knee replacement status, right    Type II diabetes mellitus ([place])    ORAL MEDICATION - NO INSULIN   Wears glasses     Past Surgical History:  Procedure Laterality Date   ABDOMINAL HYSTERECTOMY  1988   ABDOMINOPLASTY  1986   ANTERIOR AND POSTERIOR REPAIR  05/11/2012   Procedure: ANTERIOR  (CYSTOCELE) AND POSTERIOR REPAIR (RECTOCELE);  Surgeon: Reece Packer, MD;  Location: [facility];  Service: Urology;;  with graft   BREAST BIOPSY Right    CARDIAC CATHETERIZATION  1999   COLONOSCOPY     CYSTOSCOPY WITH URETHRAL DILATATION  05/11/2012   Procedure: CYSTOSCOPY WITH URETHRAL DILATATION;  Surgeon: Reece Packer, MD;  Location: [facility];  Service: Urology;;   ESOPHAGEAL MANOMETRY N/A 10/09/2021   Procedure: ESOPHAGEAL MANOMETRY (EM);  Surgeon: Irving Copas., MD;  Location: [facility];  Service: Gastroenterology;  Laterality: N/A;   ESOPHAGOGASTRODUODENOSCOPY     HERNIA REPAIR     MASTECTOMY Right 2007   PLANTAR FASCIA SURGERY Left    THYROIDECTOMY N/A 02/23/2014   Procedure: TOTAL THYROIDECTOMY;  Surgeon: Armandina Gemma, MD;  Location: [facility];  Service: General;  Laterality: N/A;   TOTAL KNEE ARTHROPLASTY Right 07/02/2015   TOTAL KNEE ARTHROPLASTY Right 07/02/2015   Procedure: TOTAL KNEE ARTHROPLASTY;  Surgeon: Elsie Saas, MD;  Location: [facility];  Service: Orthopedics;  Laterality: Right;   TOTAL THYROIDECTOMY  1976   removed three tumor (2 on right; 1 on left)   TUBAL LIGATION     UMBILICAL HERNIA REPAIR  1986   VAGINAL PROLAPSE REPAIR  05/11/2012   Procedure: VAGINAL VAULT SUSPENSION;  Surgeon: Reece Packer, MD;  Location: [facility];  Service: Urology;;   VULVA / PERINEUM BIOPSY     vulvar    Family History  Problem Relation Age of Onset   Hypertension Mother    Heart disease Mother        pacer, CHF   Hyperlipidemia Mother    Kidney disease Mother        1 kidney due to stone   Kidney Stones Mother    Hypertension Father    Diabetes Father    Alcohol abuse Father    Lung cancer Father        lung, psa   Diabetes Sister    Hypertension Sister    Breast cancer Sister    Diabetes Brother    Hypertension Brother    Hyperlipidemia Brother    Kidney disease Brother        dialysis   Stroke Brother    Hypertension Maternal Grandmother    Heart  disease Maternal Grandmother    Stroke Maternal Grandmother    Hypertension Maternal Grandfather    Hypertension Daughter    Arthritis Son    Hyperlipidemia Sister    Hypertension Sister    Diabetes Sister    Thyroid disease Sister    Kidney disease Sister    Endometriosis Sister    Breast cancer Sister        thyroid and breast   Thyroid cancer Sister    Arthritis Sister        back pain   Hypertension Sister    Thyroid disease Sister    Hypertension Brother    Hyperlipidemia Brother    Hypertension Brother    Hyperlipidemia Brother    Benign prostatic hyperplasia Brother    Hypertension Daughter    Hyperlipidemia Daughter    Diabetes Daughter    Hypertension Maternal Aunt    Hypertension Maternal Uncle    Hypertension Paternal Aunt    Cancer Paternal Aunt    Hypertension Paternal Uncle    Colon cancer Neg Hx    Stomach cancer Neg Hx    Esophageal cancer Neg Hx    Pancreatic cancer Neg Hx    Liver disease Neg Hx    Rectal cancer Neg Hx    Inflammatory bowel disease Neg Hx     Social History   Socioeconomic History   Marital status: Married    Spouse name: Not on file   Number of children: 3   Years of education: Not on file   Highest education level: Not on file  Occupational History    Comment: Retired  Tobacco Use   Smoking status: Never   Smokeless tobacco: Never  Vaping Use   Vaping Use: Never used  Substance and Sexual Activity   Alcohol use: No    Alcohol/week: 0.0 standard drinks of alcohol   Drug use: No   Sexual activity: Not Currently    Birth control/protection: Surgical    Comment: lives with husband, retired from various jobs, diabetic diet  Other Topics Concern   Not on file  Social History Narrative   Not on file   Social Determinants of Health   Financial Resource Strain: Low Risk  (11/07/2020)   Overall Financial Resource Strain (CARDIA)    Difficulty of Paying Living Expenses: Not hard at all  Food Insecurity: No Food  Insecurity (11/07/2020)   Hunger Vital Sign    Worried About Running Out of Food  in the Last Year: Never true    [place] in the Last Year: Never true  Transportation Needs: No Transportation Needs (11/07/2020)   PRAPARE - Hydrologist (Medical): No    Lack of Transportation (Non-Medical): No  Physical Activity: Insufficiently Active (11/07/2020)   Exercise Vital Sign    Days of Exercise per Week: 2 days    Minutes of Exercise per Session: 20 min  Stress: No Stress Concern Present (11/07/2020)   [place]    Feeling of Stress : Not at all  Social Connections: Moderately Integrated (11/07/2020)   Social Connection and Isolation Panel [NHANES]    Frequency of Communication with Friends and Family: More than three times a week    Frequency of Social Gatherings with Friends and Family: More than three times a week    Attends Religious Services: More than 4 times per year    Active Member of [employer] or Organizations: No    Attends Archivist Meetings: Never    Marital Status: Married  Human resources officer Violence: Not At Risk (11/07/2020)   Humiliation, Afraid, Rape, and Kick questionnaire    Fear of Current or Ex-Partner: No    Emotionally Abused: No    Physically Abused: No    Sexually Abused: No    Outpatient Medications Prior to Visit  Medication Sig Dispense Refill   alendronate (FOSAMAX) 70 MG tablet Take 1 tablet (70 mg total) by mouth once a week. Take with a full glass of water on an empty stomach. 12 tablet 1   aspirin 81 MG EC tablet Take 81 mg by mouth every other day. Swallow whole.     atorvastatin (LIPITOR) 40 MG tablet TAKE 1 TABLET BY MOUTH EVERY OTHER DAY, ALTERNATING WITH 1/2 TABLET 180 tablet 1   AVAPRO 75 MG tablet TAKE 1 TABLET (75 MG TOTAL) BY MOUTH DAILY. 90 tablet 1   Calcium Carbonate-Vitamin D (OS-CAL 500 + D PO)      Calcium Polycarbophil (FIBER) 625 MG  TABS Take 1 tablet by mouth daily.     Carboxymethylcellul-Glycerin (REFRESH OPTIVE OP) Apply to eye.     cephALEXin (KEFLEX) 500 MG capsule Take 1 capsule (500 mg total) by mouth 2 (two) times daily. 10 capsule 0   Cholecalciferol (VITAMIN D) 2000 UNITS CAPS Take 1 capsule by mouth every evening.      Ciclopirox 1 % shampoo Apply 1(one) application(s) topically 2(two) times a week 120 mL 11   cycloSPORINE (RESTASIS) 0.05 % ophthalmic emulsion Place 1 drop into both eyes 2 (two) times daily 180 each 3   diltiazem (TIADYLT ER) 360 MG 24 hr capsule Take 1 capsule (360 mg total) by mouth daily. 90 capsule 1   Estradiol 10 MCG TABS vaginal tablet INSERT 1 TAB VAGINALLY AT BEDTIME EVERY OTHER DAY 48 tablet 4   fluconazole (DIFLUCAN) 150 MG tablet Take 1 tablet (150 mg total) by mouth once a week. 2 tablet 1   gabapentin (NEURONTIN) 300 MG capsule Take 1 capsule (300 mg total) by mouth at bedtime. 90 capsule 1   glucose blood (ONE TOUCH TEST STRIPS) test strip Use as directed twice daily to check blood sugar. DX code E11.9 300 each 1   levocetirizine (XYZAL) 5 MG tablet TAKE 1 TABLET (5 MG TOTAL) BY MOUTH EVERY EVENING. 90 tablet 3   levothyroxine (SYNTHROID) 88 MCG tablet Take 1 tablet (88 mcg total) by mouth daily. **  SKIP SUNDAY** 30 tablet 11   loteprednol (LOTEMAX) 0.5 % ophthalmic suspension Place one drop into both eyes four times daily with flare up. 10 mL 3   metFORMIN (GLUCOPHAGE-XR) 750 MG 24 hr tablet Take 1 tablet (750 mg total) by mouth in the morning and at bedtime. 180 tablet 1   metoprolol succinate (TOPROL-XL) 25 MG 24 hr tablet Take 1 tablet (25 mg total) by mouth 2 (two) times daily. 180 tablet 1   mometasone (NASONEX) 50 MCG/ACT nasal spray Place 1-2 sprays in each nostril daily as needed for stuffy nose. 51 g 0   NEXIUM 40 MG capsule Take 1 capsule (40 mg total) by mouth daily. 90 capsule 1   Probiotic Product (PROBIOTIC DAILY PO) Take 1 capsule by mouth daily.     RSV vaccine  recomb adjuvanted (AREXVY) 120 MCG/0.5ML injection Inject into the muscle. 0.5 mL 0   doxycycline (VIBRAMYCIN) 100 MG capsule Take 1 capsule (100 mg total) by mouth 2 (two) times daily. 14 capsule 0   spironolactone (ALDACTONE) 25 MG tablet Take 1.5 tablets (37.5 mg total) by mouth daily. 135 tablet 1   No facility-administered medications prior to visit.    Allergies  Allergen Reactions   Codeine Anxiety    Jittery    Penicillins Itching   Sulfa Antibiotics Itching   Vicodin [Hydrocodone-Acetaminophen] Itching    ITCHING     Review of Systems  Neurological:  Positive for dizziness (associated with hypotension).       Objective:    Physical Exam Constitutional:      General: She is not in acute distress.    Appearance: Normal appearance. She is not ill-appearing.  HENT:     Head: Normocephalic and atraumatic.     Right Ear: External ear normal.     Left Ear: External ear normal.  Eyes:     Extraocular Movements: Extraocular movements intact.     Pupils: Pupils are equal, round, and reactive to light.  Cardiovascular:     Rate and Rhythm: Normal rate and regular rhythm.     Heart sounds: Normal heart sounds. No murmur heard.    No gallop.  Pulmonary:     Effort: Pulmonary effort is normal. No respiratory distress.     Breath sounds: Normal breath sounds. No wheezing or rales.  Skin:    General: Skin is warm and dry.  Neurological:     Mental Status: She is alert and oriented to person, place, and time.  Psychiatric:        Judgment: Judgment normal.     BP 119/63 (BP Location: Right Arm, Patient Position: Sitting, Cuff Size: Small)   Pulse 66   Temp (!) 97.5 F (36.4 C) (Oral)   Resp 16   Wt 166 lb (75.3 kg)   SpO2 100%   BMI 27.62 kg/m  Wt Readings from Last 3 Encounters:  06/10/22 166 lb (75.3 kg)  05/01/22 159 lb (72.1 kg)  01/09/22 161 lb 6.4 oz (73.2 kg)       Assessment & Plan:  Essential hypertension Assessment & Plan: Patient has had some  soft BP's at home, especially first thing in the AM.  Recommended that she cut her aldactone 53m from 1.5 tabs daily to 1 tab daily. She will continue to monitor her blood pressure at home and will let [facility] if she has any further low bp readings or dizziness.    Other orders -     Spironolactone; Take 1 tablet (25  mg total) by mouth daily.  Dispense: 135 tablet; Refill: 1     I,Rachel Rivera,acting as a scribe for Victoria Pear, NP.,have documented all relevant documentation on the behalf of Victoria Pear, NP,as directed by  Victoria Pear, NP while in the presence of Victoria Pear, NP.   I, Victoria Pear, NP, personally preformed the services described in this documentation.  All medical record entries made by the scribe were at my direction and in my presence.  I have reviewed the chart and discharge instructions (if applicable) and agree that the record reflects my personal performance and is accurate and complete. 06/10/22   Victoria Pear, NP

## 2022-06-10 ENCOUNTER — Ambulatory Visit (INDEPENDENT_AMBULATORY_CARE_PROVIDER_SITE_OTHER): Payer: Medicare Other | Admitting: Family

## 2022-06-10 ENCOUNTER — Other Ambulatory Visit (HOSPITAL_BASED_OUTPATIENT_CLINIC_OR_DEPARTMENT_OTHER): Payer: Self-pay

## 2022-06-10 ENCOUNTER — Other Ambulatory Visit: Payer: Self-pay

## 2022-06-10 VITALS — BP 119/63 | HR 66 | Temp 97.5°F | Resp 16 | Wt 166.0 lb

## 2022-06-10 DIAGNOSIS — I10 Essential (primary) hypertension: Secondary | ICD-10-CM | POA: Diagnosis not present

## 2022-06-10 MED ORDER — SPIRONOLACTONE 25 MG PO TABS: 25.0000 mg | ORAL_TABLET | Freq: Every day | ORAL | 1 refills | Status: DC

## 2022-06-10 MED ORDER — FLUCONAZOLE 150 MG PO TABS
150.0000 mg | ORAL_TABLET | ORAL | 1 refills | Status: DC
  Filled 2022-06-10: qty 2, 14d supply, fill #0

## 2022-06-10 NOTE — Assessment & Plan Note (Signed)
Patient has had some soft BP's at home, especially first thing in the AM.  Recommended that she cut her aldactone 69m from 1.5 tabs daily to 1 tab daily. She will continue to monitor her blood pressure at home and will let [facility] if she has any further low bp readings or dizziness.

## 2022-06-13 ENCOUNTER — Other Ambulatory Visit (HOSPITAL_BASED_OUTPATIENT_CLINIC_OR_DEPARTMENT_OTHER): Payer: Self-pay

## 2022-06-18 ENCOUNTER — Encounter: Payer: Self-pay | Admitting: Gastroenterology

## 2022-06-18 ENCOUNTER — Ambulatory Visit (INDEPENDENT_AMBULATORY_CARE_PROVIDER_SITE_OTHER): Payer: Medicare Other | Admitting: Gastroenterology

## 2022-06-18 VITALS — BP 120/76 | HR 65 | Ht 65.0 in | Wt 165.8 lb

## 2022-06-18 DIAGNOSIS — R1310 Dysphagia, unspecified: Secondary | ICD-10-CM | POA: Diagnosis not present

## 2022-06-18 DIAGNOSIS — Z8619 Personal history of other infectious and parasitic diseases: Secondary | ICD-10-CM

## 2022-06-18 DIAGNOSIS — Z86010 Personal history of colonic polyps: Secondary | ICD-10-CM

## 2022-06-18 NOTE — Patient Instructions (Signed)
You will be due for a recall colonoscopy in October 2024. We will send you a reminder in the mail when it gets closer to that time.  _______________________________________________________  If your blood pressure at your visit was 140/90 or greater, please contact your primary care physician to follow up on this.  _______________________________________________________  If you are age 73 or older, your body mass index should be between 23-30. Your Body mass index is 27.59 kg/m. If this is out of the aforementioned range listed, please consider follow up with your Primary Care Provider.  If you are age 5 or younger, your body mass index should be between 19-25. Your Body mass index is 27.59 kg/m. If this is out of the aformentioned range listed, please consider follow up with your Primary Care Provider.   ________________________________________________________  The [facility] providers would like to encourage you to use [facility] to communicate with providers for non-urgent requests or questions.  Due to long hold times on the telephone, sending your provider a message by [facility] may be a faster and more efficient way to get a response.  Please allow 48 business hours for a response.  Please remember that this is for non-urgent requests.  _______________________________________________________ It was a pleasure to see you today!  Thank you for trusting me with your gastrointestinal care!

## 2022-06-18 NOTE — Progress Notes (Signed)
[place] VISIT   Primary Care Provider Mosie Lukes, [address] [place]  [address] [phone]  Patient Profile: Victoria Walker is a 72 y.o. female with a pmh significant for Diabetes, hypertension, hyperlipidemia, hypothyroidism, prior breast cancer, sciatica, GERD, hiatal hernia, cervical ring (s/p dilation), colon polyps (TAs), H. pylori infection (confirmed eradication and now with recurrence).  The patient presents to the [facility] for an evaluation and management of problem(s) noted below:  Problem List 1. Hx of adenomatous colonic polyps   2. Dysphagia, unspecified type   3. History of Helicobacter infection      History of Present Illness Please see prior notes for full details of HPI.  Interval History The patient returns for follow-up.  Overall she has been doing well.  Our last stool antigen testing for H. pylori has returned showing no evidence of recurrent infection.  She is very happy about that.  She does worry as to whether she could get reexposed.  She has had very minimal dysphagia symptoms but has had an episode where she has had to very thoroughly chew her food before swallowing to have a little bit less sensation of food getting caught.  No other changes in her bowel habits.  She still feels healthy enough for colon polyp surveillance/colon cancer screening.  GI Review of Systems Positive as above Negative for odynophagia, nausea, vomiting, bloating, change in bowel habits, melena, hematochezia   Review of Systems General: Denies fevers/chills/weight loss unintentionally Cardiovascular: Denies chest pain Pulmonary: Denies shortness of breath Gastroenterological: See HPI Genitourinary: Denies darkened urine Hematological: Denies easy bruising/bleeding Dermatological: Denies jaundice Psychological: Mood is stable   Medications Current Outpatient Medications  Medication Sig  Dispense Refill   alendronate (FOSAMAX) 70 MG tablet Take 1 tablet (70 mg total) by mouth once a week. Take with a full glass of water on an empty stomach. 12 tablet 1   aspirin 81 MG EC tablet Take 81 mg by mouth every other day. Swallow whole.     AVAPRO 75 MG tablet TAKE 1 TABLET (75 MG TOTAL) BY MOUTH DAILY. 90 tablet 1   Calcium Carbonate-Vitamin D (OS-CAL 500 + D PO)      Calcium Polycarbophil (FIBER) 625 MG TABS Take 1 tablet by mouth daily.     Carboxymethylcellul-Glycerin (REFRESH OPTIVE OP) Apply to eye.     Cholecalciferol (VITAMIN D) 2000 UNITS CAPS Take 1 capsule by mouth every evening.      Ciclopirox 1 % shampoo Apply 1(one) application(s) topically 2(two) times a week 120 mL 11   cycloSPORINE (RESTASIS) 0.05 % ophthalmic emulsion Place 1 drop into both eyes 2 (two) times daily 180 each 3   diltiazem (TIADYLT ER) 360 MG 24 hr capsule Take 1 capsule (360 mg total) by mouth daily. 90 capsule 1   Estradiol 10 MCG TABS vaginal tablet INSERT 1 TAB VAGINALLY AT BEDTIME EVERY OTHER DAY 48 tablet 4   gabapentin (NEURONTIN) 300 MG capsule Take 1 capsule (300 mg total) by mouth at bedtime. 90 capsule 1   glucose blood (ONE TOUCH TEST STRIPS) test strip Use as directed twice daily to check blood sugar. DX code E11.9 300 each 1   levocetirizine (XYZAL) 5 MG tablet TAKE 1 TABLET (5 MG TOTAL) BY MOUTH EVERY EVENING. 90 tablet 3   levothyroxine (SYNTHROID) 88 MCG tablet Take 1 tablet (88 mcg total) by mouth daily. **SKIP SUNDAY** 30 tablet 11   loteprednol (LOTEMAX) 0.5 %  ophthalmic suspension Place one drop into both eyes four times daily with flare up. 10 mL 3   metFORMIN (GLUCOPHAGE-XR) 750 MG 24 hr tablet Take 1 tablet (750 mg total) by mouth in the morning and at bedtime. 180 tablet 1   metoprolol succinate (TOPROL-XL) 25 MG 24 hr tablet Take 1 tablet (25 mg total) by mouth 2 (two) times daily. 180 tablet 1   mometasone (NASONEX) 50 MCG/ACT nasal spray Place 1-2 sprays in each nostril daily  as needed for stuffy nose. 51 g 0   NEXIUM 40 MG capsule Take 1 capsule (40 mg total) by mouth daily. 90 capsule 1   Probiotic Product (PROBIOTIC DAILY PO) Take 1 capsule by mouth daily.     RSV vaccine recomb adjuvanted (AREXVY) 120 MCG/0.5ML injection Inject into the muscle. 0.5 mL 0   spironolactone (ALDACTONE) 25 MG tablet Take 1 tablet (25 mg total) by mouth daily. 135 tablet 1   atorvastatin (LIPITOR) 40 MG tablet TAKE 1 TABLET BY MOUTH EVERY OTHER DAY, ALTERNATING WITH 1/2 TABLET 180 tablet 1   cephALEXin (KEFLEX) 500 MG capsule Take 1 capsule (500 mg total) by mouth 2 (two) times daily for 7 days. 14 capsule 0   fluconazole (DIFLUCAN) 150 MG tablet Take 1 tablet (150 mg total) by mouth daily. May repeat dose in 3 days if symptoms do not improve 2 tablet 0   No current facility-administered medications for this visit.    Allergies Allergies  Allergen Reactions   Codeine Anxiety    Jittery    Penicillins Itching   Sulfa Antibiotics Itching   Vicodin [Hydrocodone-Acetaminophen] Itching    ITCHING     Histories Past Medical History:  Diagnosis Date   Arthritis    "both knees" (07/02/2015)   Cancer of right breast (HCC)    Claustrophobia    Dysrhythmia    HX OF FAST HEART RATE AND PALPITATIONS - METOPROLOL HAS HELPED   GERD (gastroesophageal reflux disease)    H/O hiatal hernia    H/O iritis    LEFT EYE - STATES HER EYE BECOMES RED AND VERY SENSITIVE TO LIGHT WHEN FLARE UP OF IRITIS   Heart murmur    "benign; 06/2015"   High cholesterol    History of blood transfusion    "when I had my partial hysterectomy"   History of chickenpox 09/26/2014   History of measles    History of MRSA infection    History of shingles    Hypertension    Hypothyroidism    Medicare annual wellness visit, initial 04/07/2015   Multinodular thyroid    PT HAVING TROUBLE SWALLOWING   Neuropathy    FEET   Primary localized osteoarthritis of right knee    Sciatica of right side 04/25/2015    Spinal headache    spinal headache with epidural   Total knee replacement status, right    Type II diabetes mellitus ([place])    ORAL MEDICATION - NO INSULIN   Wears glasses    Past Surgical History:  Procedure Laterality Date   ABDOMINAL HYSTERECTOMY  1988   ABDOMINOPLASTY  1986   ANTERIOR AND POSTERIOR REPAIR  05/11/2012   Procedure: ANTERIOR (CYSTOCELE) AND POSTERIOR REPAIR (RECTOCELE);  Surgeon: Reece Packer, MD;  Location: [facility];  Service: Urology;;  with graft   BREAST BIOPSY Right    CARDIAC CATHETERIZATION  1999   COLONOSCOPY     CYSTOSCOPY WITH URETHRAL DILATATION  05/11/2012   Procedure: CYSTOSCOPY WITH URETHRAL DILATATION;  Surgeon:  Reece Packer, MD;  Location: [facility];  Service: Urology;;   ESOPHAGEAL MANOMETRY N/A 10/09/2021   Procedure: ESOPHAGEAL MANOMETRY (EM);  Surgeon: Irving Copas., MD;  Location: [facility];  Service: Gastroenterology;  Laterality: N/A;   ESOPHAGOGASTRODUODENOSCOPY     HERNIA REPAIR     MASTECTOMY Right 2007   PLANTAR FASCIA SURGERY Left    THYROIDECTOMY N/A 02/23/2014   Procedure: TOTAL THYROIDECTOMY;  Surgeon: Armandina Gemma, MD;  Location: [facility];  Service: General;  Laterality: N/A;   TOTAL KNEE ARTHROPLASTY Right 07/02/2015   TOTAL KNEE ARTHROPLASTY Right 07/02/2015   Procedure: TOTAL KNEE ARTHROPLASTY;  Surgeon: Elsie Saas, MD;  Location: [facility];  Service: Orthopedics;  Laterality: Right;   TOTAL THYROIDECTOMY  1976   removed three tumor (2 on right; 1 on left)   TUBAL LIGATION     UMBILICAL HERNIA REPAIR  1986   VAGINAL PROLAPSE REPAIR  05/11/2012   Procedure: VAGINAL VAULT SUSPENSION;  Surgeon: Reece Packer, MD;  Location: [facility];  Service: Urology;;   VULVA / PERINEUM BIOPSY     vulvar   Social History   Socioeconomic History   Marital status: Married    Spouse name: Not on file   Number of children: 3   Years of education: Not on file   Highest education level: Not on file  Occupational History     Comment: Retired  Tobacco Use   Smoking status: Never   Smokeless tobacco: Never  Vaping Use   Vaping Use: Never used  Substance and Sexual Activity   Alcohol use: No    Alcohol/week: 0.0 standard drinks of alcohol   Drug use: No   Sexual activity: Not Currently    Birth control/protection: Surgical    Comment: lives with husband, retired from various jobs, diabetic diet  Other Topics Concern   Not on file  Social History Narrative   Not on file   Social Determinants of Health   Financial Resource Strain: [place]  (11/07/2020)   Overall Financial Resource Strain (CARDIA)    Difficulty of Paying Living Expenses: Not hard at all  Food Insecurity: No [place] (11/07/2020)   Hunger Vital Sign    Worried About [place] Out of Food in the Last Year: Never true    [place] in the Last Year: Never true  Transportation Needs: No Transportation Needs (11/07/2020)   PRAPARE - Hydrologist (Medical): No    Lack of Transportation (Non-Medical): No  Physical Activity: Insufficiently Active (11/07/2020)   Exercise Vital Sign    Days of Exercise per Week: 2 days    Minutes of Exercise per Session: 20 min  Stress: No Stress Concern Present (11/07/2020)   [place]    Feeling of Stress : Not at all  Social Connections: Moderately Integrated (11/07/2020)   Social Connection and Isolation Panel [NHANES]    Frequency of Communication with Friends and Family: More than three times a week    Frequency of Social Gatherings with Friends and Family: More than three times a week    Attends Religious Services: More than 4 times per year    Active Member of [employer] or Organizations: No    Attends Archivist Meetings: Never    Marital Status: Married  Human resources officer Violence: Not At Risk (11/07/2020)   Humiliation, Afraid, Rape, and Kick questionnaire    Fear of Current or Ex-Partner: No  Emotionally Abused: No    Physically Abused: No    Sexually Abused: No   Family History  Problem Relation Age of Onset   Hypertension Mother    Heart disease Mother        pacer, CHF   Hyperlipidemia Mother    Kidney disease Mother        1 kidney due to stone   Kidney Stones Mother    Hypertension Father    Diabetes Father    Alcohol abuse Father    Lung cancer Father        lung, psa   Diabetes Sister    Hypertension Sister    Breast cancer Sister    Diabetes Brother    Hypertension Brother    Hyperlipidemia Brother    Kidney disease Brother        dialysis   Stroke Brother    Hypertension Maternal Grandmother    Heart disease Maternal Grandmother    Stroke Maternal Grandmother    Hypertension Maternal Grandfather    Hypertension Daughter    Arthritis Son    Hyperlipidemia Sister    Hypertension Sister    Diabetes Sister    Thyroid disease Sister    Kidney disease Sister    Endometriosis Sister    Breast cancer Sister        thyroid and breast   Thyroid cancer Sister    Arthritis Sister        back pain   Hypertension Sister    Thyroid disease Sister    Hypertension Brother    Hyperlipidemia Brother    Hypertension Brother    Hyperlipidemia Brother    Benign prostatic hyperplasia Brother    Hypertension Daughter    Hyperlipidemia Daughter    Diabetes Daughter    Hypertension Maternal Aunt    Hypertension Maternal Uncle    Hypertension Paternal Aunt    Cancer Paternal Aunt    Hypertension Paternal Uncle    Colon cancer Neg Hx    Stomach cancer Neg Hx    Esophageal cancer Neg Hx    Pancreatic cancer Neg Hx    Liver disease Neg Hx    Rectal cancer Neg Hx    Inflammatory bowel disease Neg Hx    I have reviewed her medical, social, and family history in detail and updated the electronic medical record as necessary.    PHYSICAL EXAMINATION  BP 120/76   Pulse 65   Ht '5\' 5"'$  (1.651 m)   Wt 165 lb 12.8 oz (75.2 kg)   BMI 27.59 kg/m  Wt Readings  from Last 3 Encounters:  06/18/22 165 lb 12.8 oz (75.2 kg)  06/10/22 166 lb (75.3 kg)  05/01/22 159 lb (72.1 kg)  GEN: NAD, appears stated age, doesn't appear chronically ill PSYCH: Cooperative, without pressured speech EYE: Conjunctivae pink, sclerae anicteric ENT: MMM CV: Nontachycardic RESP: No audible wheezing GI: NABS, soft, rounded, NT/ND, without rebound MSK/EXT: No significant lower extremity edema SKIN: No jaundice NEURO:  Alert & Oriented x 3, no focal deficits   REVIEW OF DATA  I reviewed the following data at the time of this encounter:  GI Procedures and Studies  No new studies to review  Laboratory Studies  Reviewed those in epic and care everywhere  Imaging Studies  No relevant studies to review   ASSESSMENT  Victoria Walker is a 72 y.o. female with a pmh significant for Diabetes, hypertension, hyperlipidemia, hypothyroidism, prior breast cancer, sciatica, GERD, hiatal hernia, cervical ring (s/p dilation),  colon polyps (TAs), H. pylori infection (confirmed eradication and now with recurrence).  The patient is seen today for evaluation and management of:  1. Hx of adenomatous colonic polyps   2. Dysphagia, unspecified type   3. History of Helicobacter infection    The patient remains clinically and hemodynamically stable.  She is due for colon cancer screening/colon polyp surveillance later this year.  We will move forward with scheduling that as she remains an adequate candidate based on her health.  She had a single episode of near dysphagia but was able to minimize that overall.  She is doing much better than prior.  With her prior history of H. pylori infections even though we have a negative stool antigen and with her symptoms of dysphagia that she has experienced in the past, we will plan to pursue 1 more endoscopy at the time of her colonoscopy and plan for empiric dilation or further dilation since she had such a significant benefit after her last dilation.    The risks and benefits of endoscopic evaluation were discussed with the patient; these include but are not limited to the risk of perforation, infection, bleeding, missed lesions, lack of diagnosis, severe illness requiring hospitalization, as well as anesthesia and sedation related illnesses.  The patient and/or family is agreeable to proceed.  All patient questions were answered to the best of my ability, and the patient agrees to the aforementioned plan of action with follow-up as indicated.   PLAN  Surveillance colonoscopy to be scheduled for later this year Diagnostic endoscopy with dilation to be scheduled at same time as colonoscopy Continue current PPI dosing Patient to update [place] if her dysphagia symptoms progress or worsen   No orders of the defined types were placed in this encounter.   New Prescriptions   No medications on file     Planned Follow Up No follow-ups on file.   Total Time in Face-to-Face and in Coordination of Care for patient including independent/personal interpretation/review of prior testing, medical history, examination, medication adjustment, communicating results with the patient directly, and documentation with the EHR is 25 minutes.  Justice Britain, MD [facility] Office # [phone]

## 2022-06-19 ENCOUNTER — Other Ambulatory Visit (HOSPITAL_BASED_OUTPATIENT_CLINIC_OR_DEPARTMENT_OTHER): Payer: Self-pay

## 2022-06-19 ENCOUNTER — Other Ambulatory Visit: Payer: Self-pay | Admitting: Family

## 2022-06-19 ENCOUNTER — Other Ambulatory Visit: Payer: Self-pay | Admitting: Family Medicine

## 2022-06-19 ENCOUNTER — Ambulatory Visit
Admission: EM | Admit: 2022-06-19 | Discharge: 2022-06-19 | Disposition: A | Discharge status: Home / Self Care | Payer: Medicare Other | Attending: Nurse Practitioner | Admitting: Nurse Practitioner

## 2022-06-19 DIAGNOSIS — R300 Dysuria: Secondary | ICD-10-CM | POA: Insufficient documentation

## 2022-06-19 DIAGNOSIS — N898 Other specified noninflammatory disorders of vagina: Secondary | ICD-10-CM | POA: Insufficient documentation

## 2022-06-19 LAB — POCT URINALYSIS DIP (MANUAL ENTRY)
Bilirubin, UA: NEGATIVE
Glucose, UA: NEGATIVE mg/dL
Ketones, POC UA: NEGATIVE mg/dL
Leukocytes, UA: NEGATIVE
Nitrite, UA: NEGATIVE
Protein Ur, POC: NEGATIVE mg/dL
Spec Grav, UA: 1.015 (ref 1.010–1.025)
Urobilinogen, UA: 0.2 E.U./dL
pH, UA: 7 (ref 5.0–8.0)

## 2022-06-19 MED ORDER — CEPHALEXIN 500 MG PO CAPS
500.0000 mg | ORAL_CAPSULE | Freq: Two times a day (BID) | ORAL | 0 refills | Status: AC
  Filled 2022-06-19: qty 14, 7d supply, fill #0

## 2022-06-19 MED ORDER — FLUCONAZOLE 150 MG PO TABS
150.0000 mg | ORAL_TABLET | Freq: Every day | ORAL | 0 refills | Status: DC
  Filled 2022-06-19: qty 2, 3d supply, fill #0

## 2022-06-19 NOTE — Discharge Instructions (Addendum)
The clinic will contact you with results of the urine culture and vaginal swab done today Start Keflex twice daily for 7 days to treat your urinary symptoms Diflucan to treat your possible yeast infection Follow-up with your PCP if symptoms do not improve Please go to the emergency room for any worsening symptoms

## 2022-06-19 NOTE — ED Provider Notes (Signed)
[facility]    CSN: [id number] Arrival date & time: 06/19/22  1508      History   Chief Complaint Chief Complaint  Patient presents with   Back Pain   Urinary Frequency    HPI Victoria Walker is a 72 y.o. female presents for evaluation of dysuria and vaginal discharge.  Patient reports 4 days of urinary burning, urgency, frequency.  Denies hematuria, fevers, nausea/vomiting, flank pain.  Does endorse some pleuritic vaginal discharge.  No STD concern or exposure.  She is diabetic and reports a history of frequent yeast infections.  She has not been on antibiotics recently.  She has not taken any OTC medications for her symptoms.  No other concerns at this time.   Back Pain Associated symptoms: dysuria   Urinary Frequency    Past Medical History:  Diagnosis Date   Arthritis    "both knees" (07/02/2015)   Cancer of right breast ([place])    Claustrophobia    Dysrhythmia    HX OF FAST HEART RATE AND PALPITATIONS - METOPROLOL HAS HELPED   GERD (gastroesophageal reflux disease)    H/O hiatal hernia    H/O iritis    LEFT EYE - STATES HER EYE BECOMES RED AND VERY SENSITIVE TO LIGHT WHEN FLARE UP OF IRITIS   Heart murmur    "benign; 06/2015"   High cholesterol    History of blood transfusion    "when I had my partial hysterectomy"   History of chickenpox 09/26/2014   History of measles    History of MRSA infection    History of shingles    Hypertension    Hypothyroidism    Medicare annual wellness visit, initial 04/07/2015   Multinodular thyroid    PT HAVING TROUBLE SWALLOWING   Neuropathy    FEET   Primary localized osteoarthritis of right knee    Sciatica of right side 04/25/2015   Spinal headache    spinal headache with epidural   Total knee replacement status, right    Type II diabetes mellitus ([place])    ORAL MEDICATION - NO INSULIN   Wears glasses     Patient Active Problem List   Diagnosis Date Noted   History of recurrent infection 01/10/2022    Left wrist fracture, sequela 01/09/2022   Current use of beta blocker 06/12/2020   Hiatal hernia 04/22/2020   Hx of adenomatous colonic polyps [id number]   History of Helicobacter infection 12/17/2019   Dysphagia 12/17/2019   Gastroesophageal reflux disease 12/17/2019   Foot pain, bilateral 11/18/2019   Breast cancer screening, high risk patient 11/18/2019   Pre-ulcerative corn or callous 03/21/2019   Dysuria 03/21/2019   Myalgia 01/12/2019   Hammertoes of both feet 11/23/2018   Numbness and tingling of foot 11/23/2018   Other allergic rhinitis 06/14/2018   Hyperkalemia 05/04/2018   Carpal tunnel syndrome on right 05/04/2018   Left knee pain 11/03/2017   Right hip pain 03/17/2017   Urinary frequency 03/10/2017   Primary localized osteoarthritis of right knee    History of MRSA infection    History of measles    Dysrhythmia    Chronic rhinitis 06/06/2015   Shortness of breath 06/06/2015   Vaginitis and vulvovaginitis 04/25/2015   Sciatica of right side 04/25/2015   Preventative health care 04/07/2015   Allergic response 01/01/2015   History of chickenpox 09/26/2014   History of shingles    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 05/23/2014  Deformity of metatarsal bone of left foot 03/03/2014   Vulvar irritation  S/P neg vulvar bx 12/2013 01/23/2014   Multinodular non-toxic goiter 11/07/2013   Right knee DJD 07/12/2013   Neuralgia of left foot 06/24/2013   Tendonitis of ankle, right 04/08/2013   Vaginal dryness, menopausal 04/05/2013   Pronation deformity of ankle, acquired 02/09/2013   Cortical cataract 02/01/2013   H/O bone density study 01/26/2013   Chest pain 01/05/2013   History of iritis 01/04/2013   Dry eye syndrome 10/07/2012   Meibomian gland disease 10/07/2012   Gastroesophageal reflux disease without esophagitis 10/05/2012   Type 2 diabetes mellitus with diabetic neuropathy, without long-term current use of insulin ([place])  10/04/2012   Essential hypertension 10/04/2012   Hyperlipidemia, mixed 10/04/2012   S/P right mastectomy 10/04/2012   Diabetic neuropathy ([place]) 10/04/2012   S/P hysterectomy 10/04/2012   Osteoporosis 10/04/2012   Hypothyroid 10/04/2012   History of cystocele 10/04/2012   Dyspareunia in female 10/04/2012   Plantar fasciitis 10/04/2012   Nuclear sclerotic cataract 06/17/2012   Iritis 01/06/2012   Peripheral neuropathy 01/06/2012   Disease of thyroid gland 01/06/2012   Breast cancer ([place]) 05/06/2011    Past Surgical History:  Procedure Laterality Date   ABDOMINAL HYSTERECTOMY  1988   ABDOMINOPLASTY  1986   ANTERIOR AND POSTERIOR REPAIR  05/11/2012   Procedure: ANTERIOR (CYSTOCELE) AND POSTERIOR REPAIR (RECTOCELE);  Surgeon: Reece Packer, MD;  Location: [facility];  Service: Urology;;  with graft   BREAST BIOPSY Right    CARDIAC CATHETERIZATION  1999   COLONOSCOPY     CYSTOSCOPY WITH URETHRAL DILATATION  05/11/2012   Procedure: CYSTOSCOPY WITH URETHRAL DILATATION;  Surgeon: Reece Packer, MD;  Location: [facility];  Service: Urology;;   ESOPHAGEAL MANOMETRY N/A 10/09/2021   Procedure: ESOPHAGEAL MANOMETRY (EM);  Surgeon: Irving Copas., MD;  Location: [facility];  Service: Gastroenterology;  Laterality: N/A;   ESOPHAGOGASTRODUODENOSCOPY     HERNIA REPAIR     MASTECTOMY Right 2007   PLANTAR FASCIA SURGERY Left    THYROIDECTOMY N/A 02/23/2014   Procedure: TOTAL THYROIDECTOMY;  Surgeon: Armandina Gemma, MD;  Location: [facility];  Service: General;  Laterality: N/A;   TOTAL KNEE ARTHROPLASTY Right 07/02/2015   TOTAL KNEE ARTHROPLASTY Right 07/02/2015   Procedure: TOTAL KNEE ARTHROPLASTY;  Surgeon: Elsie Saas, MD;  Location: [facility];  Service: Orthopedics;  Laterality: Right;   TOTAL THYROIDECTOMY  1976   removed three tumor (2 on right; 1 on left)   TUBAL LIGATION     UMBILICAL HERNIA REPAIR  1986   VAGINAL PROLAPSE REPAIR  05/11/2012   Procedure: VAGINAL VAULT SUSPENSION;   Surgeon: Reece Packer, MD;  Location: [facility];  Service: Urology;;   VULVA / PERINEUM BIOPSY     vulvar    OB History     Gravida  3   Para  3   Term  3   Preterm      AB      Living  3      SAB      IAB      Ectopic      Multiple      Live Births               Home Medications    Prior to Admission medications   Medication Sig Start Date End Date Taking? Authorizing Provider  cephALEXin (KEFLEX) 500 MG capsule Take 1 capsule (500 mg total) by mouth 2 (two) times daily  for 7 days. 06/19/22 06/26/22 Yes Melynda Ripple, NP  fluconazole (DIFLUCAN) 150 MG tablet Take 1 tablet (150 mg total) by mouth daily. May repeat dose in 3 days if symptoms do not improve 06/19/22  Yes Melynda Ripple, NP  alendronate (FOSAMAX) 70 MG tablet Take 1 tablet (70 mg total) by mouth once a week. Take with a full glass of water on an empty stomach. 12/13/21   Mosie Lukes, MD  aspirin 81 MG EC tablet Take 81 mg by mouth every other day. Swallow whole.    [provider]  atorvastatin (LIPITOR) 40 MG tablet TAKE 1 TABLET BY MOUTH EVERY OTHER DAY, ALTERNATING WITH 1/2 TABLET 03/22/21 06/16/22  Mosie Lukes, MD  AVAPRO 75 MG tablet TAKE 1 TABLET (75 MG TOTAL) BY MOUTH DAILY. 05/20/22   Mosie Lukes, MD  Calcium Carbonate-Vitamin D (OS-CAL 500 + D PO)     [provider]  Calcium Polycarbophil (FIBER) 625 MG TABS Take 1 tablet by mouth daily.    [provider]  Carboxymethylcellul-Glycerin (REFRESH OPTIVE OP) Apply to eye.    [provider]  Cholecalciferol (VITAMIN D) 2000 UNITS CAPS Take 1 capsule by mouth every evening.     [provider]  Ciclopirox 1 % shampoo Apply 1(one) application(s) topically 2(two) times a week 12/09/21     cycloSPORINE (RESTASIS) 0.05 % ophthalmic emulsion Place 1 drop into both eyes 2 (two) times daily 09/16/21     diltiazem (TIADYLT ER) 360 MG 24 hr capsule Take 1 capsule (360 mg total) by mouth daily. 02/12/22    Mosie Lukes, MD  Estradiol 10 MCG TABS vaginal tablet INSERT 1 TAB VAGINALLY AT BEDTIME EVERY OTHER DAY 07/03/21 07/03/22  Lavonia Drafts, MD  gabapentin (NEURONTIN) 300 MG capsule Take 1 capsule (300 mg total) by mouth at bedtime. 03/19/22 03/19/23  Mosie Lukes, MD  glucose blood (ONE TOUCH TEST STRIPS) test strip Use as directed twice daily to check blood sugar. DX code E11.9 11/30/17   Mosie Lukes, MD  levocetirizine (XYZAL) 5 MG tablet TAKE 1 TABLET (5 MG TOTAL) BY MOUTH EVERY EVENING. 09/12/21 09/12/22  Clemon Chambers, MD  levothyroxine (SYNTHROID) 88 MCG tablet Take 1 tablet (88 mcg total) by mouth daily. **SKIP SUNDAY** 05/20/22 05/20/23  Mosie Lukes, MD  loteprednol (LOTEMAX) 0.5 % ophthalmic suspension Place one drop into both eyes four times daily with flare up. 05/28/21     metFORMIN (GLUCOPHAGE-XR) 750 MG 24 hr tablet Take 1 tablet (750 mg total) by mouth in the morning and at bedtime. 12/13/21 12/13/22  Mosie Lukes, MD  metoprolol succinate (TOPROL-XL) 25 MG 24 hr tablet Take 1 tablet (25 mg total) by mouth 2 (two) times daily. 01/09/22 01/09/23  Mosie Lukes, MD  mometasone (NASONEX) 50 MCG/ACT nasal spray Place 1-2 sprays in each nostril daily as needed for stuffy nose. 05/20/22   Clemon Chambers, MD  NEXIUM 40 MG capsule Take 1 capsule (40 mg total) by mouth daily. 03/19/22 03/19/23  Mosie Lukes, MD  Probiotic Product (PROBIOTIC DAILY PO) Take 1 capsule by mouth daily.    [provider]  RSV vaccine recomb adjuvanted (AREXVY) 120 MCG/0.5ML injection Inject into the muscle. 05/06/22   Carlyle Basques, MD  spironolactone (ALDACTONE) 25 MG tablet Take 1 tablet (25 mg total) by mouth daily. 06/10/22 06/10/23  Debbrah Alar, NP    Family History Family History  Problem Relation Age of Onset  Hypertension Mother    Heart disease Mother        pacer, CHF   Hyperlipidemia Mother    Kidney disease Mother        1 kidney due to stone   Kidney Stones  Mother    Hypertension Father    Diabetes Father    Alcohol abuse Father    Lung cancer Father        lung, psa   Diabetes Sister    Hypertension Sister    Breast cancer Sister    Diabetes Brother    Hypertension Brother    Hyperlipidemia Brother    Kidney disease Brother        dialysis   Stroke Brother    Hypertension Maternal Grandmother    Heart disease Maternal Grandmother    Stroke Maternal Grandmother    Hypertension Maternal Grandfather    Hypertension Daughter    Arthritis Son    Hyperlipidemia Sister    Hypertension Sister    Diabetes Sister    Thyroid disease Sister    Kidney disease Sister    Endometriosis Sister    Breast cancer Sister        thyroid and breast   Thyroid cancer Sister    Arthritis Sister        back pain   Hypertension Sister    Thyroid disease Sister    Hypertension Brother    Hyperlipidemia Brother    Hypertension Brother    Hyperlipidemia Brother    Benign prostatic hyperplasia Brother    Hypertension Daughter    Hyperlipidemia Daughter    Diabetes Daughter    Hypertension Maternal Aunt    Hypertension Maternal Uncle    Hypertension Paternal Aunt    Cancer Paternal Aunt    Hypertension Paternal Uncle    Colon cancer Neg Hx    Stomach cancer Neg Hx    Esophageal cancer Neg Hx    Pancreatic cancer Neg Hx    Liver disease Neg Hx    Rectal cancer Neg Hx    Inflammatory bowel disease Neg Hx     Social History Social History   Tobacco Use   Smoking status: Never   Smokeless tobacco: Never  Vaping Use   Vaping Use: Never used  Substance Use Topics   Alcohol use: No    Alcohol/week: 0.0 standard drinks of alcohol   Drug use: No     Allergies   Codeine, Penicillins, Sulfa antibiotics, and Vicodin [hydrocodone-acetaminophen]   Review of Systems Review of Systems  Genitourinary:  Positive for dysuria, frequency and vaginal discharge.     Physical Exam Triage Vital Signs ED Triage Vitals  Enc Vitals Group     BP  06/19/22 1610 124/78     Pulse Rate 06/19/22 1610 62     Resp 06/19/22 1610 18     Temp --      Temp src --      SpO2 06/19/22 1610 96 %     Weight --      Height --      Head Circumference --      Peak Flow --      Pain Score 06/19/22 1609 4     Pain Loc --      Pain Edu? --      Excl. in [place]? --    No data found.  Updated Vital Signs BP 124/78 (BP Location: Left Arm)   Pulse 62   Temp (!) 97.5 F (36.4  C) (Oral)   Resp 18   SpO2 96%   Visual Acuity Right Eye Distance:   Left Eye Distance:   Bilateral Distance:    Right Eye Near:   Left Eye Near:    Bilateral Near:     Physical Exam Vitals and nursing note reviewed.  Constitutional:      Appearance: Normal appearance.  HENT:     Head: Normocephalic and atraumatic.  Eyes:     Pupils: Pupils are equal, round, and reactive to light.  Cardiovascular:     Rate and Rhythm: Normal rate.  Pulmonary:     Effort: Pulmonary effort is normal.  Abdominal:     Tenderness: There is no right CVA tenderness or left CVA tenderness.  Skin:    General: Skin is warm and dry.  Neurological:     General: No focal deficit present.     Mental Status: She is alert and oriented to person, place, and time.  Psychiatric:        Mood and Affect: Mood normal.        Behavior: Behavior normal.      UC Treatments / Results  Labs (all labs ordered are listed, but only abnormal results are displayed) Labs Reviewed  POCT URINALYSIS DIP (MANUAL ENTRY) - Abnormal; Notable for the following components:      Result Value   Blood, UA trace-lysed (*)    All other components within normal limits  URINE CULTURE  CERVICOVAGINAL ANCILLARY ONLY    EKG   Radiology No results found.  Procedures Procedures (including critical care time)  Medications Ordered in UC Medications - No data to display  Initial Impression / Assessment and Plan / UC Course  I have reviewed the triage vital signs and the nursing notes.  Pertinent labs &  imaging results that were available during my care of the patient were reviewed by me and considered in my medical decision making (see chart for details).     Reviewed UA and symptoms with patient.  Trace blood but otherwise negative.  Will culture.  Patient would like to start treatment while awaiting culture results given her symptoms.  Start Keflex twice daily for 7 days Vaginal swab and will contact with those results.  Will start Diflucan for suspected yeast infection Rest and fluids Follow-up with PCP if symptoms do not improve ER precautions reviewed and patient verbalized understanding Final Clinical Impressions(s) / UC Diagnoses   Final diagnoses:  Dysuria  Vaginal discharge     Discharge Instructions      The clinic will contact you with results of the urine culture and vaginal swab done today Start Keflex twice daily for 7 days to treat your urinary symptoms Diflucan to treat your possible yeast infection Follow-up with your PCP if symptoms do not improve Please go to the emergency room for any worsening symptoms    ED Prescriptions     Medication Sig Dispense Auth. Provider   cephALEXin (KEFLEX) 500 MG capsule Take 1 capsule (500 mg total) by mouth 2 (two) times daily for 7 days. 14 capsule Melynda Ripple, NP   fluconazole (DIFLUCAN) 150 MG tablet Take 1 tablet (150 mg total) by mouth daily. May repeat dose in 3 days if symptoms do not improve 2 tablet Melynda Ripple, NP      PDMP not reviewed this encounter.   Melynda Ripple, NP 06/19/22 [phone]

## 2022-06-19 NOTE — ED Triage Notes (Signed)
Pt presents with c/O urinary frequency and feels a burning sensation X 4 days. Pt reports back pain.

## 2022-06-20 ENCOUNTER — Other Ambulatory Visit (HOSPITAL_BASED_OUTPATIENT_CLINIC_OR_DEPARTMENT_OTHER): Payer: Self-pay

## 2022-06-20 ENCOUNTER — Encounter: Payer: Self-pay | Admitting: Gastroenterology

## 2022-06-20 LAB — CERVICOVAGINAL ANCILLARY ONLY
Bacterial Vaginitis (gardnerella): NEGATIVE
Candida Glabrata: NEGATIVE
Candida Vaginitis: NEGATIVE
Comment: NEGATIVE
Comment: NEGATIVE
Comment: NEGATIVE

## 2022-06-20 MED ORDER — ATORVASTATIN CALCIUM 40 MG PO TABS
ORAL_TABLET | ORAL | 1 refills | Status: DC
  Filled 2022-06-20: qty 66, 84d supply, fill #0
  Filled 2022-09-16: qty 66, 84d supply, fill #1
  Filled 2022-12-16: qty 66, 84d supply, fill #2
  Filled 2023-03-17: qty 66, 84d supply, fill #3
  Filled 2023-06-12: qty 66, 84d supply, fill #4

## 2022-06-20 MED ORDER — METFORMIN HCL ER 750 MG PO TB24
750.0000 mg | ORAL_TABLET | Freq: Two times a day (BID) | ORAL | 1 refills | Status: DC
  Filled 2022-06-20: qty 180, 90d supply, fill #0
  Filled 2022-09-16: qty 180, 90d supply, fill #1

## 2022-06-20 MED ORDER — SPIRONOLACTONE 25 MG PO TABS
25.0000 mg | ORAL_TABLET | Freq: Every day | ORAL | 1 refills | Status: DC
  Filled 2022-06-20: qty 90, 90d supply, fill #0

## 2022-06-21 LAB — URINE CULTURE: Culture: <10000 — AB

## 2022-06-23 ENCOUNTER — Other Ambulatory Visit (HOSPITAL_BASED_OUTPATIENT_CLINIC_OR_DEPARTMENT_OTHER): Payer: Self-pay

## 2022-06-30 ENCOUNTER — Ambulatory Visit (INDEPENDENT_AMBULATORY_CARE_PROVIDER_SITE_OTHER): Payer: Medicare Other | Admitting: Family Medicine

## 2022-06-30 ENCOUNTER — Encounter: Payer: Self-pay | Admitting: Family Medicine

## 2022-06-30 VITALS — BP 122/76 | HR 73 | Resp 18 | Ht 65.0 in | Wt 167.0 lb

## 2022-06-30 DIAGNOSIS — R252 Cramp and spasm: Secondary | ICD-10-CM

## 2022-06-30 NOTE — Patient Instructions (Signed)
Labs today to check for electrolyte imbalances  Recommend adequate hydration, warm shower/bath, and stretches before bed. Pickle juice or mustard for cramping.   Please contact office for follow-up if symptoms do not improve or worsen. Seek emergency care if symptoms become severe.

## 2022-06-30 NOTE — Progress Notes (Signed)
   Acute Office Visit  Subjective:     Patient ID: Victoria Walker, female    DOB: Sep 05, 1950, 72 y.o.   MRN: [id number]  Chief Complaint  Patient presents with   Leg Cramps     Both legs, mainly right leg  Cramping all around knee cap  Has happened before in the past     HPI Patient is in today for leg cramping  Patient states she started having a flare-up of leg cramping 2-3 days ago. States both legs will cramp intermittently, primarily at night or when at rest. States this has happened in the past, but has been well controlled lately. She was last seen for this in 2022 by Dr. Nani Ravens and labs were unremarkable. She was encouraged to try pickle juice at bedtime for relief/prevention and states that worked well. Reports she tries to drink about 64 ounces of water a day and eats a balanced diet.     ROS All review of systems negative except what is listed in the HPI      Objective:    BP 122/76 (BP Location: Left Arm, Patient Position: Sitting, Cuff Size: Normal)   Pulse 73   Resp 18   Ht 5\' 5"  (1.651 m)   Wt 167 lb (75.8 kg)   SpO2 100%   BMI 27.79 kg/m    Physical Exam Vitals reviewed.  Constitutional:      Appearance: Normal appearance.  Cardiovascular:     Rate and Rhythm: Normal rate and regular rhythm.     Pulses: Normal pulses.     Heart sounds: Normal heart sounds.  Pulmonary:     Effort: Pulmonary effort is normal.     Breath sounds: Normal breath sounds.  Musculoskeletal:        General: No swelling or tenderness. Normal range of motion.     Right lower leg: No edema.     Left lower leg: No edema.     Comments: Negative Homans sign bilaterally  Skin:    General: Skin is warm and dry.  Neurological:     Mental Status: She is alert and oriented to person, place, and time.  Psychiatric:        Mood and Affect: Mood normal.        Behavior: Behavior normal.        Thought Content: Thought content normal.        Judgment: Judgment normal.      No results found for any visits on 06/30/22.      Assessment & Plan:   Problem List Items Addressed This Visit   None Visit Diagnoses     Leg cramping    -  Primary Labs today to check for electrolyte imbalances  Recommend adequate hydration, warm shower/bath, and stretches before bed. Pickle juice or mustard for cramping.  Patient aware of signs/symptoms requiring further/urgent evaluation.     Relevant Orders   Basic metabolic panel   Magnesium       No orders of the defined types were placed in this encounter.   Return if symptoms worsen or fail to improve.  Terrilyn Saver, NP

## 2022-07-01 LAB — BASIC METABOLIC PANEL
BUN: 13 mg/dL (ref 6–23)
CO2: 28 mEq/L (ref 19–32)
Calcium: 10.1 mg/dL (ref 8.4–10.5)
Chloride: 100 mEq/L (ref 96–112)
Creatinine, Ser: 0.84 mg/dL (ref 0.40–1.20)
GFR: 69.33 mL/min (ref >60.00)
Glucose, Bld: 78 mg/dL (ref 70–99)
Potassium: 4.1 mEq/L (ref 3.5–5.1)
Sodium: 139 mEq/L (ref 135–145)

## 2022-07-01 LAB — MAGNESIUM: Magnesium: 1.5 mg/dL (ref 1.5–2.5)

## 2022-07-16 ENCOUNTER — Other Ambulatory Visit (HOSPITAL_BASED_OUTPATIENT_CLINIC_OR_DEPARTMENT_OTHER): Payer: Self-pay

## 2022-07-16 ENCOUNTER — Other Ambulatory Visit: Payer: Self-pay

## 2022-07-16 ENCOUNTER — Telehealth: Payer: Self-pay | Admitting: Family Medicine

## 2022-07-16 DIAGNOSIS — I129 Hypertensive chronic kidney disease with stage 1 through stage 4 chronic kidney disease, or unspecified chronic kidney disease: Secondary | ICD-10-CM | POA: Diagnosis not present

## 2022-07-16 DIAGNOSIS — E1122 Type 2 diabetes mellitus with diabetic chronic kidney disease: Secondary | ICD-10-CM | POA: Diagnosis not present

## 2022-07-16 DIAGNOSIS — N1831 Chronic kidney disease, stage 3a: Secondary | ICD-10-CM | POA: Diagnosis not present

## 2022-07-16 MED ORDER — SPIRONOLACTONE 25 MG PO TABS
37.5000 mg | ORAL_TABLET | Freq: Every day | ORAL | 1 refills | Status: DC
  Filled 2022-07-16 – 2022-08-19 (×2): qty 135, 90d supply, fill #0
  Filled 2022-09-16: qty 135, 90d supply, fill #1
  Filled 2022-11-20: qty 78, 52d supply, fill #1
  Filled 2022-11-20: qty 57, 38d supply, fill #1

## 2022-07-16 NOTE — Telephone Encounter (Signed)
Called pt was advised

## 2022-07-16 NOTE — Telephone Encounter (Signed)
Medication sent.

## 2022-07-16 NOTE — Telephone Encounter (Signed)
Patient states she had to go back to her original dosage of her spironolactone, due to her ankles swelling again. She would like a call back to discuss. Please advise.

## 2022-07-29 DIAGNOSIS — M1712 Unilateral primary osteoarthritis, left knee: Secondary | ICD-10-CM | POA: Diagnosis not present

## 2022-07-30 ENCOUNTER — Other Ambulatory Visit: Payer: Self-pay | Admitting: Family Medicine

## 2022-07-31 ENCOUNTER — Other Ambulatory Visit (HOSPITAL_BASED_OUTPATIENT_CLINIC_OR_DEPARTMENT_OTHER): Payer: Self-pay

## 2022-07-31 MED ORDER — DILTIAZEM HCL ER BEADS 360 MG PO CP24
360.0000 mg | ORAL_CAPSULE | Freq: Every day | ORAL | 1 refills | Status: DC
  Filled 2022-07-31: qty 90, 90d supply, fill #0
  Filled 2022-08-19 – 2022-11-20 (×2): qty 90, 90d supply, fill #1

## 2022-07-31 MED ORDER — ALENDRONATE SODIUM 70 MG PO TABS
70.0000 mg | ORAL_TABLET | ORAL | 3 refills | Status: DC
  Filled 2022-07-31: qty 12, 84d supply, fill #0
  Filled 2022-11-16: qty 12, 84d supply, fill #1
  Filled 2023-02-10: qty 12, 84d supply, fill #2
  Filled 2023-04-27: qty 12, 84d supply, fill #3

## 2022-08-06 ENCOUNTER — Encounter: Payer: Self-pay | Admitting: Obstetrics & Gynecology

## 2022-08-06 ENCOUNTER — Ambulatory Visit (HOSPITAL_BASED_OUTPATIENT_CLINIC_OR_DEPARTMENT_OTHER)
Admission: RE | Admit: 2022-08-06 | Discharge: 2022-08-06 | Disposition: A | Discharge status: Home / Self Care | Payer: Medicare Other | Source: Ambulatory Visit | Attending: Obstetrics & Gynecology | Admitting: Obstetrics & Gynecology

## 2022-08-06 ENCOUNTER — Ambulatory Visit (INDEPENDENT_AMBULATORY_CARE_PROVIDER_SITE_OTHER): Payer: Medicare Other | Admitting: Obstetrics & Gynecology

## 2022-08-06 VITALS — BP 106/57 | HR 68 | Wt 163.0 lb

## 2022-08-06 DIAGNOSIS — Z1339 Encounter for screening examination for other mental health and behavioral disorders: Secondary | ICD-10-CM

## 2022-08-06 DIAGNOSIS — M25552 Pain in left hip: Secondary | ICD-10-CM | POA: Insufficient documentation

## 2022-08-06 DIAGNOSIS — Z87898 Personal history of other specified conditions: Secondary | ICD-10-CM

## 2022-08-06 DIAGNOSIS — Z01419 Encounter for gynecological examination (general) (routine) without abnormal findings: Secondary | ICD-10-CM | POA: Diagnosis not present

## 2022-08-06 DIAGNOSIS — Z853 Personal history of malignant neoplasm of breast: Secondary | ICD-10-CM

## 2022-08-06 NOTE — Progress Notes (Signed)
Patient not due for mammogram until August. Patient due for colonoscopy in October.   Patient plans to have knee replacement in July. Armandina Stammer RN

## 2022-08-06 NOTE — Progress Notes (Signed)
Subjective:     Victoria Walker is a 72 y.o. female here for a routine exam.  Current complaints: no GYN complaints.      Gynecologic History No LMP recorded. Patient has had a hysterectomy. Contraception: post menopausal status Last Pap: s/p hyst  . Last mammogram: 12/06/2021 Results were: normal  Obstetric History OB History  Gravida Para Term Preterm AB Living  3 3 3     3   SAB IAB Ectopic Multiple Live Births               # Outcome Date GA Lbr Len/2nd Weight Sex Delivery Anes PTL Lv  3 Term 94    F Vag-Spont     2 Term 1973    F Vag-Spont     1 Term 1969    M Vag-Spont        The following portions of the patient's history were reviewed and updated as appropriate: allergies, current medications, past family history, past medical history, past social history, past surgical history, and problem list.  Review of Systems Pertinent items are noted in HPI.    Objective:  BP (!) 106/57   Pulse 68   Wt 163 lb (73.9 kg)   BMI 27.12 kg/m  General Appearance:    Alert, cooperative, no distress, appears stated age  Head:    Normocephalic, without obvious abnormality, atraumatic  Eyes:    conjunctiva/corneas clear, EOM's intact, both eyes  Ears:    Normal external ear canals, both ears  Nose:   Nares normal, septum midline, mucosa normal, no drainage    or sinus tenderness  Throat:   Lips, mucosa, and tongue normal; teeth and gums normal  Neck:   Supple, symmetrical, trachea midline, no adenopathy;    thyroid:  no enlargement/tenderness/nodules  Back:     Symmetric, no curvature, ROM normal, no CVA tenderness  Lungs:     respirations unlabored  Chest Wall:    No tenderness or deformity   Heart:    Regular rate and rhythm  Breast Exam:    No tenderness, masses, or nipple abnormality  Abdomen:     Soft, non-tender, bowel sounds active all four quadrants,    no masses, no organomegaly  Genitalia:    Normal female without lesion, discharge or tenderness     Extremities:    Extremities: atraumatic, no cyanosis or edema; Left hip pain/tenderness- FROM but, with tenderness.    Pulses:   2+ and symmetric all extremities  Skin:   Skin color, texture, turgor normal, no rashes or lesions   Assessment:    Healthy female exam.  Left hip pain - x-ray ordered and results forwarded to Dr. Rogelia Rohrer.    Plan:  Diagnoses and all orders for this visit:  Pain of left hip -     DG HIP UNILAT WITH PELVIS 2-3 VIEWS LEFT; Future  History of abnormal mammogram  History of breast cancer   F/u in 1 year or sooner prn   Margel Joens L. Harraway-Smith, M.D., [place]

## 2022-08-13 ENCOUNTER — Telehealth: Payer: Self-pay

## 2022-08-13 NOTE — Telephone Encounter (Signed)
-----   Message from Willodean Rosenthal, MD sent at 08/13/2022  1:53 PM EDT ----- Please call pt. The x-ray does shoe arthritis in her hip. NO fracture noted. She should see her primary care provider to discuss further plans.  Clh-S

## 2022-08-13 NOTE — Telephone Encounter (Signed)
Patient called and made aware xray showed some arthritis in her hip. Patient made aware to follow up with her PCP provider. Armandina Stammer RN

## 2022-08-15 DIAGNOSIS — E1140 Type 2 diabetes mellitus with diabetic neuropathy, unspecified: Secondary | ICD-10-CM | POA: Diagnosis not present

## 2022-08-19 ENCOUNTER — Other Ambulatory Visit: Payer: Self-pay | Admitting: Family Medicine

## 2022-08-19 ENCOUNTER — Other Ambulatory Visit: Payer: Self-pay | Admitting: Internal Medicine

## 2022-08-20 ENCOUNTER — Other Ambulatory Visit: Payer: Self-pay

## 2022-08-20 ENCOUNTER — Other Ambulatory Visit (HOSPITAL_COMMUNITY): Payer: Self-pay

## 2022-08-20 ENCOUNTER — Encounter: Payer: Self-pay | Admitting: Obstetrics & Gynecology

## 2022-08-20 ENCOUNTER — Other Ambulatory Visit (HOSPITAL_BASED_OUTPATIENT_CLINIC_OR_DEPARTMENT_OTHER): Payer: Self-pay

## 2022-08-20 MED ORDER — LEVOCETIRIZINE DIHYDROCHLORIDE 5 MG PO TABS
5.0000 mg | ORAL_TABLET | Freq: Every evening | ORAL | 0 refills | Status: DC
  Filled 2022-08-20: qty 90, 90d supply, fill #0

## 2022-08-20 MED ORDER — METOPROLOL SUCCINATE ER 25 MG PO TB24
25.0000 mg | ORAL_TABLET | Freq: Two times a day (BID) | ORAL | 1 refills | Status: DC
  Filled 2022-08-20: qty 180, 90d supply, fill #0
  Filled 2022-11-20: qty 180, 90d supply, fill #1

## 2022-08-20 MED ORDER — MOMETASONE FUROATE 50 MCG/ACT NA SUSP
1.0000 | Freq: Every day | NASAL | 0 refills | Status: DC | PRN
  Filled 2022-08-20: qty 51, 90d supply, fill #0

## 2022-08-21 ENCOUNTER — Other Ambulatory Visit (HOSPITAL_BASED_OUTPATIENT_CLINIC_OR_DEPARTMENT_OTHER): Payer: Self-pay

## 2022-08-22 ENCOUNTER — Other Ambulatory Visit (HOSPITAL_BASED_OUTPATIENT_CLINIC_OR_DEPARTMENT_OTHER): Payer: Self-pay

## 2022-08-22 DIAGNOSIS — H40023 Open angle with borderline findings, high risk, bilateral: Secondary | ICD-10-CM | POA: Diagnosis not present

## 2022-08-22 DIAGNOSIS — H25813 Combined forms of age-related cataract, bilateral: Secondary | ICD-10-CM | POA: Diagnosis not present

## 2022-08-22 DIAGNOSIS — E119 Type 2 diabetes mellitus without complications: Secondary | ICD-10-CM | POA: Diagnosis not present

## 2022-08-22 DIAGNOSIS — H04123 Dry eye syndrome of bilateral lacrimal glands: Secondary | ICD-10-CM | POA: Diagnosis not present

## 2022-08-22 LAB — HM DIABETES EYE EXAM

## 2022-08-22 MED ORDER — CYCLOSPORINE 0.05 % OP EMUL
1.0000 [drp] | Freq: Two times a day (BID) | OPHTHALMIC | 6 refills | Status: DC
  Filled 2022-08-22 – 2022-09-16 (×2): qty 5.5, 30d supply, fill #0
  Filled 2022-09-16: qty 5.5, 7d supply, fill #0

## 2022-08-26 LAB — HEMOGLOBIN A1C: A1c: 6.3

## 2022-09-08 ENCOUNTER — Ambulatory Visit (INDEPENDENT_AMBULATORY_CARE_PROVIDER_SITE_OTHER): Payer: Medicare Other | Admitting: Family

## 2022-09-08 VITALS — BP 117/59 | HR 64 | Temp 98.2°F | Resp 16 | Wt 167.0 lb

## 2022-09-08 DIAGNOSIS — J302 Other seasonal allergic rhinitis: Secondary | ICD-10-CM | POA: Diagnosis not present

## 2022-09-08 DIAGNOSIS — I10 Essential (primary) hypertension: Secondary | ICD-10-CM

## 2022-09-08 DIAGNOSIS — E1140 Type 2 diabetes mellitus with diabetic neuropathy, unspecified: Secondary | ICD-10-CM | POA: Diagnosis not present

## 2022-09-08 DIAGNOSIS — E782 Mixed hyperlipidemia: Secondary | ICD-10-CM | POA: Diagnosis not present

## 2022-09-08 DIAGNOSIS — M810 Age-related osteoporosis without current pathological fracture: Secondary | ICD-10-CM | POA: Diagnosis not present

## 2022-09-08 DIAGNOSIS — Z7984 Long term (current) use of oral hypoglycemic drugs: Secondary | ICD-10-CM

## 2022-09-08 DIAGNOSIS — E039 Hypothyroidism, unspecified: Secondary | ICD-10-CM | POA: Diagnosis not present

## 2022-09-08 LAB — BASIC METABOLIC PANEL
BUN: 16 mg/dL (ref 6–23)
CO2: 30 mEq/L (ref 19–32)
Calcium: 9.6 mg/dL (ref 8.4–10.5)
Chloride: 101 mEq/L (ref 96–112)
Creatinine, Ser: 1.02 mg/dL (ref 0.40–1.20)
GFR: 54.85 mL/min — ABNORMAL LOW (ref >60.00)
Glucose, Bld: 80 mg/dL (ref 70–99)
Potassium: 4.9 mEq/L (ref 3.5–5.1)
Sodium: 141 mEq/L (ref 135–145)

## 2022-09-08 LAB — HEMOGLOBIN A1C: Hgb A1c MFr Bld: 6.5 % (ref 4.6–6.5)

## 2022-09-08 NOTE — Assessment & Plan Note (Addendum)
Lab Results  Component Value Date   HGBA1C 6.5 05/01/2022   HGBA1C 6.6 (H) 12/03/2021   HGBA1C 6.6 (H) 07/08/2021   Lab Results  Component Value Date   MICROALBUR <0.7 05/01/2022   LDLCALC 103 (H) 05/01/2022   CREATININE 0.84 06/30/2022   Stable on diabetic diet and metformin

## 2022-09-08 NOTE — Progress Notes (Signed)
Subjective:   By signing my name below, I, Shehryar Baig, attest that this documentation has been prepared under the direction and in the presence of Sandford Craze, NP. 09/08/2022   Patient ID: Victoria Walker, female    DOB: 1951/03/17, 72 y.o.   MRN: [id number]  Chief Complaint  Patient presents with   Hypertension    Here for follow up    HPI Patient is in today for a follow up visit.   Blood pressure: Last visit her bp was a bit low. We decreased her aldactone from 1.5 tabs to 1 tab.  She reports LE edema increased after that adjustment so she went back up on her dose. She reports dizziness this morning but her blood pressure is stable during this visit.   BP Readings from Last 3 Encounters:  09/08/22 (!) 117/59  08/06/22 (!) 106/57  06/30/22 122/76   Pulse Readings from Last 3 Encounters:  09/08/22 64  08/06/22 68  06/30/22 73   Blood sugar: Her last A1C was stable. She continues taking 750 mg Metformin 2x daily PO.  Lab Results  Component Value Date   HGBA1C 6.5 05/01/2022   Cholesterol: She continues alternating 40 mg and 20 mg Lipitor every other day to manage her cholesterol and reports no new issues while taking it.  Lab Results  Component Value Date   CHOL 175 05/01/2022   HDL 52.70 05/01/2022   LDLCALC 103 (H) 05/01/2022   LDLDIRECT 107.0 07/01/2016   TRIG 97.0 05/01/2022   CHOLHDL 3 05/01/2022   Thyroid: She continues taking 88 mcg synthroid every day except Sunday and reports no new issues while taking it.  Lab Results  Component Value Date   TSH 0.87 05/01/2022   Bone health: She continues taking 70 mg Fosamax every Sunday and reports no new issues while taking it.   Allergies: Her allergies are flaring up and she is taking Flonase nasal spray, Xyzal, and neti-pot to help manage her symptoms.   Nephrologist: She recently met with her nephrologist and reports no new issues.   Knee replacement: She has an upcoming knee replacement procedure  planned.    Past Medical History:  Diagnosis Date   Arthritis    "both knees" (07/02/2015)   Cancer of right breast (HCC)    Claustrophobia    Dysrhythmia    HX OF FAST HEART RATE AND PALPITATIONS - METOPROLOL HAS HELPED   GERD (gastroesophageal reflux disease)    H/O hiatal hernia    H/O iritis    LEFT EYE - STATES HER EYE BECOMES RED AND VERY SENSITIVE TO LIGHT WHEN FLARE UP OF IRITIS   Heart murmur    "benign; 06/2015"   High cholesterol    History of blood transfusion    "when I had my partial hysterectomy"   History of chickenpox 09/26/2014   History of measles    History of MRSA infection    History of shingles    Hypertension    Hypothyroidism    Medicare annual wellness visit, initial 04/07/2015   Multinodular thyroid    PT HAVING TROUBLE SWALLOWING   Neuropathy    FEET   Primary localized osteoarthritis of right knee    Sciatica of right side 04/25/2015   Spinal headache    spinal headache with epidural   Total knee replacement status, right    Type II diabetes mellitus (HCC)    ORAL MEDICATION - NO INSULIN   Wears glasses     Past Surgical  History:  Procedure Laterality Date   ABDOMINAL HYSTERECTOMY  1988   ABDOMINOPLASTY  1986   ANTERIOR AND POSTERIOR REPAIR  05/11/2012   Procedure: ANTERIOR (CYSTOCELE) AND POSTERIOR REPAIR (RECTOCELE);  Surgeon: Martina Sinner, MD;  Location: [facility];  Service: Urology;;  with graft   BREAST BIOPSY Right    CARDIAC CATHETERIZATION  1999   COLONOSCOPY     CYSTOSCOPY WITH URETHRAL DILATATION  05/11/2012   Procedure: CYSTOSCOPY WITH URETHRAL DILATATION;  Surgeon: Martina Sinner, MD;  Location: [facility];  Service: Urology;;   ESOPHAGEAL MANOMETRY N/A 10/09/2021   Procedure: ESOPHAGEAL MANOMETRY (EM);  Surgeon: Lemar Lofty., MD;  [facility]: [facility];  Service: Gastroenterology;  Laterality: N/A;   ESOPHAGOGASTRODUODENOSCOPY     HERNIA REPAIR     MASTECTOMY Right 2007   PLANTAR FASCIA SURGERY Left     THYROIDECTOMY N/A 02/23/2014   Procedure: TOTAL THYROIDECTOMY;  Surgeon: Darnell Level, MD;  Location: [facility];  Service: General;  Laterality: N/A;   TOTAL KNEE ARTHROPLASTY Right 07/02/2015   TOTAL KNEE ARTHROPLASTY Right 07/02/2015   Procedure: TOTAL KNEE ARTHROPLASTY;  Surgeon: Salvatore Marvel, MD;  Location: [facility];  Service: Orthopedics;  Laterality: Right;   TOTAL THYROIDECTOMY  1976   removed three tumor (2 on right; 1 on left)   TUBAL LIGATION     UMBILICAL HERNIA REPAIR  1986   VAGINAL PROLAPSE REPAIR  05/11/2012   Procedure: VAGINAL VAULT SUSPENSION;  Surgeon: Martina Sinner, MD;  Location: [facility];  Service: Urology;;   VULVA / PERINEUM BIOPSY     vulvar    Family History  Problem Relation Age of Onset   Hypertension Maternal Grandmother    Heart disease Maternal Grandmother    Stroke Maternal Grandmother    Hypertension Maternal Grandfather    Hypertension Father    Diabetes Father    Alcohol abuse Father    Lung cancer Father        lung, psa   Hypertension Mother    Heart disease Mother        pacer, CHF   Hyperlipidemia Mother    Kidney disease Mother        1 kidney due to stone   Kidney Stones Mother    Diabetes Brother    Hypertension Brother    Hyperlipidemia Brother    Kidney disease Brother        dialysis   Stroke Brother    Hypertension Brother    Hyperlipidemia Brother    Hypertension Brother    Hyperlipidemia Brother    Benign prostatic hyperplasia Brother    Diabetes Sister    Hypertension Sister    Breast cancer Sister    Hyperlipidemia Sister    Hypertension Sister    Diabetes Sister    Thyroid disease Sister    Kidney disease Sister    Endometriosis Sister    Breast cancer Sister        thyroid and breast   Thyroid cancer Sister    Arthritis Sister        back pain   Hypertension Sister    Thyroid disease Sister    Hypertension Daughter    Hypertension Daughter    Hyperlipidemia Daughter    Diabetes Daughter    Arthritis Son     Hypertension Maternal Aunt    Hypertension Maternal Uncle    Hypertension Paternal Aunt    Cancer Paternal Aunt    Hypertension Paternal Uncle    Colon cancer Neg  Hx    Stomach cancer Neg Hx    Esophageal cancer Neg Hx    Pancreatic cancer Neg Hx    Liver disease Neg Hx    Rectal cancer Neg Hx    Inflammatory bowel disease Neg Hx     Social History   Socioeconomic History   Marital status: Married    Spouse name: Not on file   Number of children: 3   Years of education: Not on file   Highest education level: Not on file  Occupational History    Comment: Retired  Tobacco Use   Smoking status: Never   Smokeless tobacco: Never  Vaping Use   Vaping Use: Never used  Substance and Sexual Activity   Alcohol use: No    Alcohol/week: 0.0 standard drinks of alcohol   Drug use: No   Sexual activity: Not Currently    Birth control/protection: Surgical    Comment: lives with husband, retired from various jobs, diabetic diet  Other Topics Concern   Not on file  Social History Narrative   Not on file   Social Determinants of Health   Financial Resource Strain: Low Risk  (11/07/2020)   Overall Financial Resource Strain (CARDIA)    Difficulty of Paying Living Expenses: Not hard at all  Food Insecurity: No Food Insecurity (11/07/2020)   Hunger Vital Sign    Worried About Running Out of Food in the Last Year: Never true    Ran Out of Food in the Last Year: Never true  Transportation Needs: No Transportation Needs (11/07/2020)   PRAPARE - Administrator, Civil Service (Medical): No    Lack of Transportation (Non-Medical): No  Physical Activity: Insufficiently Active (11/07/2020)   Exercise Vital Sign    Days of Exercise per Week: 2 days    Minutes of Exercise per Session: 20 min  Stress: No Stress Concern Present (11/07/2020)   [employer] of Occupational Health - Occupational Stress Questionnaire    Feeling of Stress : Not at all  Social Connections: Moderately  Integrated (11/07/2020)   Social Connection and Isolation Panel [NHANES]    Frequency of Communication with Friends and Family: More than three times a week    Frequency of Social Gatherings with Friends and Family: More than three times a week    Attends Religious Services: More than 4 times per year    Active Member of [employer] or Organizations: No    Attends Banker Meetings: Never    Marital Status: Married  Catering manager Violence: Not At Risk (11/07/2020)   Humiliation, Afraid, Rape, and Kick questionnaire    Fear of Current or Ex-Partner: No    Emotionally Abused: No    Physically Abused: No    Sexually Abused: No    Outpatient Medications Prior to Visit  Medication Sig Dispense Refill   alendronate (FOSAMAX) 70 MG tablet Take 1 tablet (70 mg total) by mouth once a week. Take with a full glass of water on an empty stomach. 12 tablet 3   aspirin 81 MG EC tablet Take 81 mg by mouth every other day. Swallow whole.     atorvastatin (LIPITOR) 40 MG tablet TAKE 1 TABLET BY MOUTH EVERY OTHER DAY, ALTERNATING WITH 1/2 TABLET 180 tablet 1   AVAPRO 75 MG tablet TAKE 1 TABLET (75 MG TOTAL) BY MOUTH DAILY. 90 tablet 1   Calcium Carbonate-Vitamin D (OS-CAL 500 + D PO)      Calcium Polycarbophil (FIBER) 625 MG TABS  Take 1 tablet by mouth daily.     Carboxymethylcellul-Glycerin (REFRESH OPTIVE OP) Apply to eye.     Cholecalciferol (VITAMIN D) 2000 UNITS CAPS Take 1 capsule by mouth every evening.      Ciclopirox 1 % shampoo Apply 1(one) application(s) topically 2(two) times a week 120 mL 11   cycloSPORINE (RESTASIS MULTIDOSE) 0.05 % ophthalmic emulsion Place 1 drop into both eyes 2 (two) times daily. 5.5 mL 6   diltiazem (TIADYLT ER) 360 MG 24 hr capsule Take 1 capsule (360 mg total) by mouth daily. 90 capsule 1   Estradiol 10 MCG TABS vaginal tablet INSERT 1 TAB VAGINALLY AT BEDTIME EVERY OTHER DAY 48 tablet 4   fluconazole (DIFLUCAN) 150 MG tablet Take 1 tablet (150 mg total) by  mouth daily. May repeat dose in 3 days if symptoms do not improve 2 tablet 0   gabapentin (NEURONTIN) 300 MG capsule Take 1 capsule (300 mg total) by mouth at bedtime. 90 capsule 1   glucose blood (ONE TOUCH TEST STRIPS) test strip Use as directed twice daily to check blood sugar. DX code E11.9 300 each 1   levocetirizine (XYZAL) 5 MG tablet Take 1 tablet (5 mg total) by mouth every evening. 90 tablet 0   levothyroxine (SYNTHROID) 88 MCG tablet Take 1 tablet (88 mcg total) by mouth daily. **SKIP SUNDAY** 30 tablet 11   loteprednol (LOTEMAX) 0.5 % ophthalmic suspension Place one drop into both eyes four times daily with flare up. 10 mL 3   metFORMIN (GLUCOPHAGE-XR) 750 MG 24 hr tablet Take 1 tablet (750 mg total) by mouth in the morning and at bedtime. 180 tablet 1   metoprolol succinate (TOPROL-XL) 25 MG 24 hr tablet Take 1 tablet (25 mg total) by mouth 2 (two) times daily. 180 tablet 1   mometasone (NASONEX) 50 MCG/ACT nasal spray Place 1-2 sprays in each nostril daily as needed for stuffy nose. 51 g 0   NEXIUM 40 MG capsule Take 1 capsule (40 mg total) by mouth daily. 90 capsule 1   Probiotic Product (PROBIOTIC DAILY PO) Take 1 capsule by mouth daily.     RSV vaccine recomb adjuvanted (AREXVY) 120 MCG/0.5ML injection Inject into the muscle. 0.5 mL 0   spironolactone (ALDACTONE) 25 MG tablet Take 1.5 tablets (37.5 mg total) by mouth daily. 135 tablet 1   No facility-administered medications prior to visit.    Allergies  Allergen Reactions   Codeine Anxiety    Jittery    Penicillins Itching   Sulfa Antibiotics Itching   Vicodin [Hydrocodone-Acetaminophen] Itching    ITCHING     Review of Systems  Cardiovascular:  Positive for leg swelling.       Objective:    Physical Exam Constitutional:      General: She is not in acute distress.    Appearance: Normal appearance. She is not ill-appearing.  HENT:     Head: Normocephalic and atraumatic.     Right Ear: External ear normal.      Left Ear: External ear normal.  Eyes:     Extraocular Movements: Extraocular movements intact.     Pupils: Pupils are equal, round, and reactive to light.  Cardiovascular:     Rate and Rhythm: Normal rate and regular rhythm.     Heart sounds: Normal heart sounds. No murmur heard.    No gallop.  Pulmonary:     Effort: Pulmonary effort is normal. No respiratory distress.     Breath sounds: Normal breath sounds. No  wheezing or rales.  Musculoskeletal:     Right lower leg: 1+ Edema present.     Left lower leg: 1+ Edema present.  Skin:    General: Skin is warm and dry.  Neurological:     Mental Status: She is alert and oriented to person, place, and time.  Psychiatric:        Judgment: Judgment normal.     BP (!) 117/59 (BP Location: Right Arm, Patient Position: Sitting, Cuff Size: Small)   Pulse 64   Temp 98.2 F (36.8 C) (Oral)   Resp 16   Wt 167 lb (75.8 kg)   BMI 27.79 kg/m  Wt Readings from Last 3 Encounters:  09/08/22 167 lb (75.8 kg)  08/06/22 163 lb (73.9 kg)  06/30/22 167 lb (75.8 kg)       Assessment & Plan:  Type 2 diabetes mellitus with diabetic neuropathy, without long-term current use of insulin (HCC) Assessment & Plan: Lab Results  Component Value Date   HGBA1C 6.5 05/01/2022   HGBA1C 6.6 (H) 12/03/2021   HGBA1C 6.6 (H) 07/08/2021   Lab Results  Component Value Date   MICROALBUR <0.7 05/01/2022   LDLCALC 103 (H) 05/01/2022   CREATININE 0.84 06/30/2022   Stable on diabetic diet and metformin   Orders: -     Hemoglobin A1c -     Basic metabolic panel  Age-related osteoporosis without current pathological fracture Assessment & Plan: Maintained on fosamax.  Last bone density 3/23.    Hyperlipidemia, mixed Assessment & Plan: Lab Results  Component Value Date   CHOL 175 05/01/2022   HDL 52.70 05/01/2022   LDLCALC 103 (H) 05/01/2022   LDLDIRECT 107.0 07/01/2016   TRIG 97.0 05/01/2022   CHOLHDL 3 05/01/2022   Stable with atorvastatin 40mg   alternating with 20mg .  Continue same.    Hypothyroidism, unspecified type Assessment & Plan: Lab Results  Component Value Date   TSH 0.87 05/01/2022   Continues synthroid 88 mcg.  She states she takes 6 days a week.     Seasonal allergic rhinitis, unspecified trigger Assessment & Plan: Stable with xyzal and nasonex.   Essential hypertension Assessment & Plan: BP looks ok on metoprolol and aldactone 25 mg 1.5 tabs once daily.  Advised ok to continue this dosing regimen since her dizziness has improved and her LE edema did not tolerate the lower dose of aldactone.      I, Lemont Fillers, NP, personally preformed the services described in this documentation.  All medical record entries made by the scribe were at my direction and in my presence.  I have reviewed the chart and discharge instructions (if applicable) and agree that the record reflects my personal performance and is accurate and complete. 09/08/2022   I,Shehryar Baig,acting as a scribe for Lemont Fillers, NP.,have documented all relevant documentation on the behalf of Lemont Fillers, NP,as directed by  Lemont Fillers, NP while in the presence of Lemont Fillers, NP.   Lemont Fillers, NP

## 2022-09-08 NOTE — Assessment & Plan Note (Signed)
Stable with xyzal and nasonex.

## 2022-09-08 NOTE — Assessment & Plan Note (Signed)
BP looks ok on metoprolol and aldactone 25 mg 1.5 tabs once daily.  Advised ok to continue this dosing regimen since her dizziness has improved and her LE edema did not tolerate the lower dose of aldactone.

## 2022-09-08 NOTE — Assessment & Plan Note (Addendum)
Lab Results  Component Value Date   CHOL 175 05/01/2022   HDL 52.70 05/01/2022   LDLCALC 103 (H) 05/01/2022   LDLDIRECT 107.0 07/01/2016   TRIG 97.0 05/01/2022   CHOLHDL 3 05/01/2022   Stable with atorvastatin 40mg  alternating with 20mg .  Continue same.

## 2022-09-08 NOTE — Assessment & Plan Note (Signed)
Maintained on fosamax.  Last bone density 3/23.

## 2022-09-08 NOTE — Assessment & Plan Note (Signed)
Lab Results  Component Value Date   TSH 0.87 05/01/2022   Continues synthroid 88 mcg.  She states she takes 6 days a week.

## 2022-09-10 ENCOUNTER — Other Ambulatory Visit (HOSPITAL_BASED_OUTPATIENT_CLINIC_OR_DEPARTMENT_OTHER): Payer: Self-pay

## 2022-09-10 ENCOUNTER — Other Ambulatory Visit: Payer: Self-pay | Admitting: Family Medicine

## 2022-09-10 MED ORDER — LEVOTHYROXINE SODIUM 88 MCG PO TABS
88.0000 ug | ORAL_TABLET | Freq: Every day | ORAL | 0 refills | Status: DC
  Filled 2022-09-10 – 2022-09-16 (×2): qty 90, 90d supply, fill #0

## 2022-09-12 ENCOUNTER — Other Ambulatory Visit (HOSPITAL_BASED_OUTPATIENT_CLINIC_OR_DEPARTMENT_OTHER): Payer: Self-pay

## 2022-09-16 ENCOUNTER — Other Ambulatory Visit: Payer: Self-pay

## 2022-09-16 ENCOUNTER — Other Ambulatory Visit: Payer: Self-pay | Admitting: Family Medicine

## 2022-09-16 ENCOUNTER — Other Ambulatory Visit (HOSPITAL_BASED_OUTPATIENT_CLINIC_OR_DEPARTMENT_OTHER): Payer: Self-pay

## 2022-09-16 DIAGNOSIS — R109 Unspecified abdominal pain: Secondary | ICD-10-CM

## 2022-09-16 DIAGNOSIS — E785 Hyperlipidemia, unspecified: Secondary | ICD-10-CM

## 2022-09-16 DIAGNOSIS — Z780 Asymptomatic menopausal state: Secondary | ICD-10-CM

## 2022-09-16 DIAGNOSIS — I10 Essential (primary) hypertension: Secondary | ICD-10-CM

## 2022-09-16 DIAGNOSIS — K219 Gastro-esophageal reflux disease without esophagitis: Secondary | ICD-10-CM

## 2022-09-16 MED ORDER — GABAPENTIN 300 MG PO CAPS
300.0000 mg | ORAL_CAPSULE | Freq: Every day | ORAL | 1 refills | Status: DC
  Filled 2022-09-16: qty 90, 90d supply, fill #0
  Filled 2022-12-16: qty 90, 90d supply, fill #1

## 2022-09-16 MED ORDER — NEXIUM 40 MG PO CPDR
40.0000 mg | DELAYED_RELEASE_CAPSULE | Freq: Every day | ORAL | 1 refills | Status: DC
  Filled 2022-09-16: qty 90, 90d supply, fill #0
  Filled 2022-12-16: qty 90, 90d supply, fill #1

## 2022-09-16 NOTE — Progress Notes (Unsigned)
FOLLOW UP Date of Service/Encounter:  09/18/22   Subjective:  Victoria Walker (DOB: Jun 26, 1950) is a 72 y.o. female who [facility] in re-evaluation of the following: allergic rhinitis History obtained from: chart review and patient.  For Review, LV was on 09/12/21  with Dr.Nina Mondor seen for routine follow-up. See below for summary of history and diagnostics.  Therapeutic plans/changes recommended: we made no changes to her allergy plan as she was doing well.   Pertinent History/Diagnostics:  Allergic Rhinitis:   controlled on levocetirizine 5 mg once a day, mometasone nasal spray daily, and occasional nasal saline rinses.  Has open angle glaucoma. -------------------------------------- Today presents for her yearly follow-up. Overall she has been doing well.  She does have upcoming surgery for her left knee as well as cataracts repair. She continues taking her levocetirizine daily as well as using her Nasonex daily. She does have some mild PND. Reports dry thick mucus in her throat sometimes.  She does occasionally use sinus rinses.  She used 1 this morning.   Allergies as of 09/18/2022       Reactions   Codeine Anxiety   Jittery    Penicillins Itching   Sulfa Antibiotics Itching   Vicodin [hydrocodone-acetaminophen] Itching   ITCHING         Medication List        Accurate as of Sep 18, 2022 12:55 PM. If you have any questions, ask your nurse or doctor.          alendronate 70 MG tablet Commonly known as: Fosamax Take 1 tablet (70 mg total) by mouth once a week. Take with a full glass of water on an empty stomach.   Arexvy 120 MCG/0.5ML injection Generic drug: RSV vaccine recomb adjuvanted Inject into the muscle.   aspirin EC 81 MG tablet Take 81 mg by mouth every other day. Swallow whole.   atorvastatin 40 MG tablet Commonly known as: LIPITOR TAKE 1 TABLET BY MOUTH EVERY OTHER DAY, ALTERNATING WITH 1/2  TABLET   Avapro 75 MG tablet Generic drug: irbesartan TAKE 1 TABLET (75 MG TOTAL) BY MOUTH DAILY.   Ciclopirox 1 % shampoo Apply 1(one) application(s) topically 2(two) times a week   cycloSPORINE 0.05 % ophthalmic emulsion Commonly known as: Restasis MultiDose Place 1 drop into both eyes 2 (two) times daily.   diltiazem 360 MG 24 hr capsule Commonly known as: Tiadylt ER Take 1 capsule (360 mg total) by mouth daily.   Fiber 625 MG Tabs Take 1 tablet by mouth daily.   fluconazole 150 MG tablet Commonly known as: Diflucan Take 1 tablet (150 mg total) by mouth daily. May repeat dose in 3 days if symptoms do not improve   gabapentin 300 MG capsule Commonly known as: NEURONTIN Take 1 capsule (300 mg total) by mouth at bedtime.   glucose blood test strip Commonly known as: ONE TOUCH TEST STRIPS Use as directed twice daily to check blood sugar. DX code E11.9   levocetirizine 5 MG tablet Commonly known as: XYZAL Take 1 tablet (5 mg total) by mouth every evening.   levothyroxine 88 MCG tablet Commonly known as: SYNTHROID Take 1 tablet (88 mcg total) by mouth daily. **SKIP SUNDAY**   Lotemax 0.5 % ophthalmic suspension Generic drug: loteprednol Place one drop into both eyes four times daily with flare up.   metFORMIN 750 MG 24 hr tablet Commonly known as: GLUCOPHAGE-XR Take 1 tablet (750 mg total) by mouth in the  morning and at bedtime.   metoprolol succinate 25 MG 24 hr tablet Commonly known as: TOPROL-XL Take 1 tablet (25 mg total) by mouth 2 (two) times daily.   mometasone 50 MCG/ACT nasal spray Commonly known as: NASONEX Place 1-2 sprays in each nostril daily as needed for stuffy nose.   NexIUM 40 MG capsule Generic drug: esomeprazole Take 1 capsule (40 mg total) by mouth daily.   OS-CAL 500 + D PO   PROBIOTIC DAILY PO Take 1 capsule by mouth daily.   REFRESH OPTIVE OP Apply to eye.   spironolactone 25 MG tablet Commonly known as: ALDACTONE Take 1.5  tablets (37.5 mg total) by mouth daily.   Vitamin D 50 MCG (2000 UT) Caps Take 1 capsule by mouth every evening.       Past Medical History:  Diagnosis Date   Arthritis    "both knees" (07/02/2015)   Cancer of right breast (HCC)    Claustrophobia    Dysrhythmia    HX OF FAST HEART RATE AND PALPITATIONS - METOPROLOL HAS HELPED   GERD (gastroesophageal reflux disease)    H/O hiatal hernia    H/O iritis    LEFT EYE - STATES HER EYE BECOMES RED AND VERY SENSITIVE TO LIGHT WHEN FLARE UP OF IRITIS   Heart murmur    "benign; 06/2015"   High cholesterol    History of blood transfusion    "when I had my partial hysterectomy"   History of chickenpox 09/26/2014   History of measles    History of MRSA infection    History of shingles    Hypertension    Hypothyroidism    Medicare annual wellness visit, initial 04/07/2015   Multinodular thyroid    PT HAVING TROUBLE SWALLOWING   Neuropathy    FEET   Primary localized osteoarthritis of right knee    Sciatica of right side 04/25/2015   Spinal headache    spinal headache with epidural   Total knee replacement status, right    Type II diabetes mellitus (HCC)    ORAL MEDICATION - NO INSULIN   Wears glasses    Past Surgical History:  Procedure Laterality Date   ABDOMINAL HYSTERECTOMY  1988   ABDOMINOPLASTY  1986   ANTERIOR AND POSTERIOR REPAIR  05/11/2012   Procedure: ANTERIOR (CYSTOCELE) AND POSTERIOR REPAIR (RECTOCELE);  Surgeon: Martina Sinner, MD;  Location: [facility];  Service: Urology;;  with graft   BREAST BIOPSY Right    CARDIAC CATHETERIZATION  1999   COLONOSCOPY     CYSTOSCOPY WITH URETHRAL DILATATION  05/11/2012   Procedure: CYSTOSCOPY WITH URETHRAL DILATATION;  Surgeon: Martina Sinner, MD;  Location: [facility];  Service: Urology;;   ESOPHAGEAL MANOMETRY N/A 10/09/2021   Procedure: ESOPHAGEAL MANOMETRY (EM);  Surgeon: Lemar Lofty., MD;  Location: [facility];  Service: Gastroenterology;  Laterality: N/A;    ESOPHAGOGASTRODUODENOSCOPY     HERNIA REPAIR     MASTECTOMY Right 2007   PLANTAR FASCIA SURGERY Left    THYROIDECTOMY N/A 02/23/2014   Procedure: TOTAL THYROIDECTOMY;  Surgeon: Darnell Level, MD;  Location: [facility];  Service: General;  Laterality: N/A;   TOTAL KNEE ARTHROPLASTY Right 07/02/2015   TOTAL KNEE ARTHROPLASTY Right 07/02/2015   Procedure: TOTAL KNEE ARTHROPLASTY;  Surgeon: Salvatore Marvel, MD;  Location: [facility];  Service: Orthopedics;  Laterality: Right;   TOTAL THYROIDECTOMY  1976   removed three tumor (2 on right; 1 on left)   TUBAL LIGATION     UMBILICAL HERNIA  REPAIR  1986   VAGINAL PROLAPSE REPAIR  05/11/2012   Procedure: VAGINAL VAULT SUSPENSION;  Surgeon: Martina Sinner, MD;  Location: [facility];  Service: Urology;;   VULVA / PERINEUM BIOPSY     vulvar   Otherwise, there have been no changes to her past medical history, surgical history, family history, or social history.  ROS: All others negative except as noted per HPI.   Objective:  BP 110/62 (BP Location: Left Arm, Patient Position: Sitting, Cuff Size: Normal)   Pulse 65   Temp 97.9 F (36.6 C) (Temporal)   Resp 16   Wt 164 lb 9.6 oz (74.7 kg)   SpO2 98%   BMI 27.39 kg/m  Body mass index is 27.39 kg/m. Physical Exam: General Appearance:  Alert, cooperative, no distress, appears stated age  Head:  Normocephalic, without obvious abnormality, atraumatic  Eyes:  Conjunctiva clear, EOM's intact  Nose: Nares normal, hypertrophic turbinates and normal mucosa  Throat: Lips, tongue normal; teeth and gums normal, normal posterior oropharynx  Neck: Supple, symmetrical  Lungs:   clear to auscultation bilaterally, Respirations unlabored, no coughing  Heart:  regular rate and rhythm and no murmur, Appears well perfused  Extremities: No edema  Skin: Skin color, texture, turgor normal and no rashes or lesions on visualized portions of skin  Neurologic: No gross deficits   Assessment/Plan   Allergic  rhinitis-controlled Continue levocetirizine 5 mg once a day as needed for runny nose or itch. Try skipping days to see if this helps with dryness. Continue Nasacort 1-2 sprays in each nostril once a day if needed for a stuffy nose.  In the right nostril, point the applicator out toward the right ear. In the left nostril, point the applicator out toward the left ear-switching from Nasonex Continue saline nasal rinses as needed for nasal symptoms. Use this before any medicated nasal sprays for best result. Use as often as needed. Consider nasal saline gel as needed for dry or irritated nostrils  Can try Mucinex 600 mg (1 tablet) up to twice daily for the next 3-5 days. See if this loosens what is in your throat. It can be overly drying, so drink with lots of water and consider salt water gargles. (Don't drink the salt water).  Call the clinic if this treatment plan is not working well for you  Tonny Bollman, MD  [facility]

## 2022-09-17 DIAGNOSIS — I10 Essential (primary) hypertension: Secondary | ICD-10-CM | POA: Diagnosis not present

## 2022-09-17 DIAGNOSIS — Z01818 Encounter for other preprocedural examination: Secondary | ICD-10-CM | POA: Diagnosis not present

## 2022-09-18 ENCOUNTER — Encounter: Payer: Self-pay | Admitting: Internal Medicine

## 2022-09-18 ENCOUNTER — Other Ambulatory Visit: Payer: Self-pay

## 2022-09-18 ENCOUNTER — Ambulatory Visit (INDEPENDENT_AMBULATORY_CARE_PROVIDER_SITE_OTHER): Payer: Medicare Other | Admitting: Internal Medicine

## 2022-09-18 ENCOUNTER — Other Ambulatory Visit (HOSPITAL_BASED_OUTPATIENT_CLINIC_OR_DEPARTMENT_OTHER): Payer: Self-pay

## 2022-09-18 VITALS — BP 110/62 | HR 65 | Temp 97.9°F | Resp 16 | Wt 164.6 lb

## 2022-09-18 DIAGNOSIS — J3089 Other allergic rhinitis: Secondary | ICD-10-CM

## 2022-09-18 MED ORDER — MOMETASONE FUROATE 50 MCG/ACT NA SUSP
1.0000 | Freq: Every day | NASAL | 3 refills | Status: DC | PRN
  Filled 2022-09-18 – 2022-11-20 (×2): qty 51, 90d supply, fill #0
  Filled 2023-02-10: qty 51, 90d supply, fill #1
  Filled 2023-05-18: qty 17, 30d supply, fill #2
  Filled 2023-06-12: qty 17, 30d supply, fill #3
  Filled 2023-07-20: qty 17, 30d supply, fill #4
  Filled 2023-08-17: qty 17, 30d supply, fill #5
  Filled 2023-09-12: qty 17, 30d supply, fill #6

## 2022-09-18 MED ORDER — LEVOCETIRIZINE DIHYDROCHLORIDE 5 MG PO TABS
5.0000 mg | ORAL_TABLET | Freq: Every evening | ORAL | 3 refills | Status: DC
  Filled 2022-09-18 – 2022-11-20 (×2): qty 90, 90d supply, fill #0
  Filled 2023-02-10: qty 90, 90d supply, fill #1
  Filled 2023-05-18: qty 90, 90d supply, fill #2
  Filled 2023-08-17: qty 90, 90d supply, fill #3

## 2022-09-18 NOTE — Patient Instructions (Addendum)
Allergic rhinitis-controlled Continue levocetirizine 5 mg once a day as needed for runny nose or itch. Try skipping days to see if this helps with dryness. Continue Nasacort 1-2 sprays in each nostril once a day if needed for a stuffy nose.  In the right nostril, point the applicator out toward the right ear. In the left nostril, point the applicator out toward the left ear-switching from Nasonex Continue saline nasal rinses as needed for nasal symptoms. Use this before any medicated nasal sprays for best result. Use as often as needed. Consider nasal saline gel as needed for dry or irritated nostrils  Can try Mucinex 600 mg (1 tablet) up to twice daily for the next 3-5 days. See if this loosens what is in your throat. It can be overly drying, so drink with lots of water and consider salt water gargles. (Don't drink the salt water).  Call the clinic if this treatment plan is not working well for you  Follow up in 1 year or sooner if needed. It was a pleasure seeing you again in clinic today! Thank you for allowing me to participate in your care.  Tonny Bollman, MD [facility]

## 2022-09-19 ENCOUNTER — Other Ambulatory Visit (HOSPITAL_BASED_OUTPATIENT_CLINIC_OR_DEPARTMENT_OTHER): Payer: Self-pay

## 2022-09-19 MED ORDER — LOTEPREDNOL ETABONATE 0.5 % OP SUSP
1.0000 [drp] | Freq: Four times a day (QID) | OPHTHALMIC | 3 refills | Status: DC | PRN
  Filled 2022-09-19 – 2022-09-22 (×2): qty 5, 25d supply, fill #0
  Filled 2023-03-31: qty 5, 25d supply, fill #1
  Filled 2023-06-12: qty 5, 25d supply, fill #2
  Filled 2023-09-07: qty 5, 25d supply, fill #3

## 2022-09-19 MED ORDER — CYCLOSPORINE 0.05 % OP EMUL
1.0000 [drp] | Freq: Two times a day (BID) | OPHTHALMIC | 3 refills | Status: DC
  Filled 2022-09-19: qty 180, 90d supply, fill #0
  Filled 2022-12-16: qty 180, 90d supply, fill #1
  Filled 2023-03-17: qty 180, 90d supply, fill #2
  Filled 2023-06-12: qty 60, 30d supply, fill #3
  Filled 2023-08-17: qty 60, 30d supply, fill #4
  Filled 2023-09-12: qty 60, 30d supply, fill #5

## 2022-09-22 ENCOUNTER — Other Ambulatory Visit: Payer: Self-pay

## 2022-09-22 ENCOUNTER — Other Ambulatory Visit (HOSPITAL_BASED_OUTPATIENT_CLINIC_OR_DEPARTMENT_OTHER): Payer: Self-pay

## 2022-09-22 DIAGNOSIS — Z780 Asymptomatic menopausal state: Secondary | ICD-10-CM

## 2022-09-22 DIAGNOSIS — N952 Postmenopausal atrophic vaginitis: Secondary | ICD-10-CM

## 2022-09-22 MED ORDER — ESTRADIOL 10 MCG VA TABS
10.0000 ug | ORAL_TABLET | VAGINAL | 4 refills | Status: DC
  Filled 2022-09-22 – 2022-09-23 (×2): qty 40, 80d supply, fill #0
  Filled 2023-03-17: qty 40, 80d supply, fill #1
  Filled 2023-06-12: qty 16, 30d supply, fill #2
  Filled 2023-09-12: qty 16, 30d supply, fill #3

## 2022-09-23 ENCOUNTER — Other Ambulatory Visit: Payer: Self-pay

## 2022-09-23 ENCOUNTER — Other Ambulatory Visit (HOSPITAL_BASED_OUTPATIENT_CLINIC_OR_DEPARTMENT_OTHER): Payer: Self-pay

## 2022-09-24 DIAGNOSIS — C50911 Malignant neoplasm of unspecified site of right female breast: Secondary | ICD-10-CM | POA: Diagnosis not present

## 2022-09-25 ENCOUNTER — Other Ambulatory Visit (HOSPITAL_BASED_OUTPATIENT_CLINIC_OR_DEPARTMENT_OTHER): Payer: Self-pay

## 2022-09-25 DIAGNOSIS — M1712 Unilateral primary osteoarthritis, left knee: Secondary | ICD-10-CM | POA: Diagnosis not present

## 2022-09-26 ENCOUNTER — Other Ambulatory Visit (HOSPITAL_BASED_OUTPATIENT_CLINIC_OR_DEPARTMENT_OTHER): Payer: Self-pay

## 2022-09-30 ENCOUNTER — Other Ambulatory Visit (HOSPITAL_BASED_OUTPATIENT_CLINIC_OR_DEPARTMENT_OTHER): Payer: Self-pay

## 2022-10-06 DIAGNOSIS — C50911 Malignant neoplasm of unspecified site of right female breast: Secondary | ICD-10-CM | POA: Diagnosis not present

## 2022-10-07 ENCOUNTER — Telehealth: Payer: Self-pay | Admitting: Family Medicine

## 2022-10-07 ENCOUNTER — Other Ambulatory Visit (HOSPITAL_BASED_OUTPATIENT_CLINIC_OR_DEPARTMENT_OTHER): Payer: Self-pay

## 2022-10-07 NOTE — Telephone Encounter (Signed)
Will get forms to Dr.Blyth to review when we received them.

## 2022-10-07 NOTE — Telephone Encounter (Signed)
Pt is going to have [facility] send over the surgical clearance form.

## 2022-10-07 NOTE — Telephone Encounter (Signed)
Pt wanted to inform the provider that she is having knee surgery on 7.3.24. States that they are supposed to send a letter from [facility] but she just wanted everyone to be aware.

## 2022-10-13 DIAGNOSIS — M1712 Unilateral primary osteoarthritis, left knee: Secondary | ICD-10-CM | POA: Diagnosis not present

## 2022-10-13 DIAGNOSIS — R001 Bradycardia, unspecified: Secondary | ICD-10-CM | POA: Diagnosis not present

## 2022-10-16 ENCOUNTER — Telehealth: Payer: Self-pay

## 2022-10-16 NOTE — Telephone Encounter (Signed)
Called spoke with pt per Dr.Blyth regarding her up coming  Knee procedure, to make sure there aren't any concerns and changes in her health. Pt stated she is doing well ready to have the surgery and Pt had appt on 11/03/22 and so, we canceled that appt and rescheduled her because it is a week after the surgery. Told pt I would let Dr.Blyth know, we will fax over paperwork for surgery.

## 2022-10-17 NOTE — Telephone Encounter (Signed)
Form faxed

## 2022-10-20 ENCOUNTER — Other Ambulatory Visit (HOSPITAL_BASED_OUTPATIENT_CLINIC_OR_DEPARTMENT_OTHER): Payer: Self-pay

## 2022-10-21 ENCOUNTER — Other Ambulatory Visit (HOSPITAL_BASED_OUTPATIENT_CLINIC_OR_DEPARTMENT_OTHER): Payer: Self-pay

## 2022-10-22 ENCOUNTER — Encounter (HOSPITAL_BASED_OUTPATIENT_CLINIC_OR_DEPARTMENT_OTHER): Payer: Self-pay

## 2022-10-22 ENCOUNTER — Other Ambulatory Visit (HOSPITAL_BASED_OUTPATIENT_CLINIC_OR_DEPARTMENT_OTHER): Payer: Self-pay

## 2022-10-22 DIAGNOSIS — Z885 Allergy status to narcotic agent status: Secondary | ICD-10-CM | POA: Diagnosis not present

## 2022-10-22 DIAGNOSIS — Z882 Allergy status to sulfonamides status: Secondary | ICD-10-CM | POA: Diagnosis not present

## 2022-10-22 DIAGNOSIS — I499 Cardiac arrhythmia, unspecified: Secondary | ICD-10-CM | POA: Diagnosis not present

## 2022-10-22 DIAGNOSIS — I10 Essential (primary) hypertension: Secondary | ICD-10-CM | POA: Diagnosis not present

## 2022-10-22 DIAGNOSIS — Z79899 Other long term (current) drug therapy: Secondary | ICD-10-CM | POA: Diagnosis not present

## 2022-10-22 DIAGNOSIS — Z880 Allergy status to penicillin: Secondary | ICD-10-CM | POA: Diagnosis not present

## 2022-10-22 DIAGNOSIS — M1712 Unilateral primary osteoarthritis, left knee: Secondary | ICD-10-CM | POA: Diagnosis not present

## 2022-10-22 DIAGNOSIS — K219 Gastro-esophageal reflux disease without esophagitis: Secondary | ICD-10-CM | POA: Diagnosis not present

## 2022-10-23 DIAGNOSIS — Z880 Allergy status to penicillin: Secondary | ICD-10-CM | POA: Diagnosis not present

## 2022-10-23 DIAGNOSIS — Z885 Allergy status to narcotic agent status: Secondary | ICD-10-CM | POA: Diagnosis not present

## 2022-10-23 DIAGNOSIS — M1712 Unilateral primary osteoarthritis, left knee: Secondary | ICD-10-CM | POA: Diagnosis not present

## 2022-10-23 DIAGNOSIS — Z882 Allergy status to sulfonamides status: Secondary | ICD-10-CM | POA: Diagnosis not present

## 2022-10-23 DIAGNOSIS — I10 Essential (primary) hypertension: Secondary | ICD-10-CM | POA: Diagnosis not present

## 2022-10-23 DIAGNOSIS — Z79899 Other long term (current) drug therapy: Secondary | ICD-10-CM | POA: Diagnosis not present

## 2022-10-23 DIAGNOSIS — K219 Gastro-esophageal reflux disease without esophagitis: Secondary | ICD-10-CM | POA: Diagnosis not present

## 2022-10-23 DIAGNOSIS — I499 Cardiac arrhythmia, unspecified: Secondary | ICD-10-CM | POA: Diagnosis not present

## 2022-10-24 ENCOUNTER — Other Ambulatory Visit (HOSPITAL_BASED_OUTPATIENT_CLINIC_OR_DEPARTMENT_OTHER): Payer: Self-pay

## 2022-10-24 ENCOUNTER — Encounter: Payer: Self-pay | Admitting: Family Medicine

## 2022-10-24 DIAGNOSIS — Z79899 Other long term (current) drug therapy: Secondary | ICD-10-CM | POA: Diagnosis not present

## 2022-10-24 DIAGNOSIS — K219 Gastro-esophageal reflux disease without esophagitis: Secondary | ICD-10-CM | POA: Diagnosis not present

## 2022-10-24 DIAGNOSIS — Z885 Allergy status to narcotic agent status: Secondary | ICD-10-CM | POA: Diagnosis not present

## 2022-10-24 DIAGNOSIS — I10 Essential (primary) hypertension: Secondary | ICD-10-CM | POA: Diagnosis not present

## 2022-10-24 DIAGNOSIS — I499 Cardiac arrhythmia, unspecified: Secondary | ICD-10-CM | POA: Diagnosis not present

## 2022-10-24 DIAGNOSIS — Z880 Allergy status to penicillin: Secondary | ICD-10-CM | POA: Diagnosis not present

## 2022-10-24 DIAGNOSIS — M1712 Unilateral primary osteoarthritis, left knee: Secondary | ICD-10-CM | POA: Diagnosis not present

## 2022-10-24 DIAGNOSIS — Z882 Allergy status to sulfonamides status: Secondary | ICD-10-CM | POA: Diagnosis not present

## 2022-10-24 MED ORDER — ASPIRIN 325 MG PO TBEC
325.0000 mg | DELAYED_RELEASE_TABLET | Freq: Two times a day (BID) | ORAL | 0 refills | Status: DC
  Filled 2022-10-24: qty 100, 50d supply, fill #0

## 2022-10-24 MED ORDER — VITAMIN D (ERGOCALCIFEROL) 1.25 MG (50000 UNIT) PO CAPS
50000.0000 [IU] | ORAL_CAPSULE | ORAL | 0 refills | Status: DC
  Filled 2022-10-24: qty 6, 42d supply, fill #0

## 2022-10-24 MED ORDER — SENNOSIDES-DOCUSATE SODIUM 8.6-50 MG PO TABS
ORAL_TABLET | ORAL | 0 refills | Status: DC
  Filled 2022-10-24: qty 100, 50d supply, fill #0

## 2022-10-24 MED ORDER — OXYCODONE HCL 5 MG PO TABS
5.0000 mg | ORAL_TABLET | ORAL | 0 refills | Status: DC | PRN
  Filled 2022-10-24: qty 40, 7d supply, fill #0

## 2022-10-27 ENCOUNTER — Other Ambulatory Visit (HOSPITAL_BASED_OUTPATIENT_CLINIC_OR_DEPARTMENT_OTHER): Payer: Self-pay

## 2022-10-27 MED ORDER — TRAMADOL HCL 50 MG PO TABS
50.0000 mg | ORAL_TABLET | Freq: Four times a day (QID) | ORAL | 0 refills | Status: DC | PRN
  Filled 2022-10-27: qty 28, 7d supply, fill #0

## 2022-10-27 NOTE — Telephone Encounter (Signed)
I sent a message to pt letting her know she would need to be seen, but Pt did have surgery 3 days ago. Is this something she can wait on until able to come in the office please advise.

## 2022-10-28 ENCOUNTER — Other Ambulatory Visit (HOSPITAL_BASED_OUTPATIENT_CLINIC_OR_DEPARTMENT_OTHER): Payer: Self-pay

## 2022-10-28 DIAGNOSIS — Z96652 Presence of left artificial knee joint: Secondary | ICD-10-CM | POA: Diagnosis not present

## 2022-10-28 DIAGNOSIS — Z471 Aftercare following joint replacement surgery: Secondary | ICD-10-CM | POA: Diagnosis not present

## 2022-10-28 NOTE — Telephone Encounter (Signed)
Called pt was advised of the lesion and stated it has gone away and  Its no pain. Pt stated she just wanted to make sure it wasn't anything,but it good now.

## 2022-10-29 ENCOUNTER — Telehealth: Payer: Self-pay | Admitting: Family Medicine

## 2022-10-29 NOTE — Telephone Encounter (Signed)
Pt had knee surgery a week ago today and she said that on Sunday she was constipated so she took some miralax but now she has diarrhea and can't get it stop. Pt took kaopectate and flour water but nothing helps. Pt wants to know what else she can try. Please advise. Pt uses [facility]

## 2022-10-30 DIAGNOSIS — Z96652 Presence of left artificial knee joint: Secondary | ICD-10-CM | POA: Diagnosis not present

## 2022-10-30 DIAGNOSIS — Z471 Aftercare following joint replacement surgery: Secondary | ICD-10-CM | POA: Diagnosis not present

## 2022-10-30 NOTE — Telephone Encounter (Signed)
Called pt was advised of need to do stool sample for Cdiff and a stool for wbc and a stool culture.Pt stated  It has gotten better and not seen any blood in stool. She stated not able to get out do to having knee surgery and was drinking lots of fluids.  Pt was advised if see any blood or start back to seek care and pt stated she understand.

## 2022-10-31 DIAGNOSIS — Z96652 Presence of left artificial knee joint: Secondary | ICD-10-CM | POA: Diagnosis not present

## 2022-10-31 DIAGNOSIS — Z471 Aftercare following joint replacement surgery: Secondary | ICD-10-CM | POA: Diagnosis not present

## 2022-11-03 ENCOUNTER — Encounter: Payer: Medicare Other | Admitting: Family Medicine

## 2022-11-04 ENCOUNTER — Other Ambulatory Visit (HOSPITAL_BASED_OUTPATIENT_CLINIC_OR_DEPARTMENT_OTHER): Payer: Self-pay

## 2022-11-04 DIAGNOSIS — Z96652 Presence of left artificial knee joint: Secondary | ICD-10-CM | POA: Diagnosis not present

## 2022-11-04 DIAGNOSIS — Z471 Aftercare following joint replacement surgery: Secondary | ICD-10-CM | POA: Diagnosis not present

## 2022-11-04 MED ORDER — TRAMADOL HCL 50 MG PO TABS
50.0000 mg | ORAL_TABLET | Freq: Four times a day (QID) | ORAL | 0 refills | Status: DC | PRN
  Filled 2022-11-04: qty 28, 7d supply, fill #0

## 2022-11-04 MED ORDER — ONDANSETRON HCL 4 MG PO TABS
4.0000 mg | ORAL_TABLET | Freq: Three times a day (TID) | ORAL | 0 refills | Status: DC | PRN
  Filled 2022-11-04: qty 20, 7d supply, fill #0

## 2022-11-05 DIAGNOSIS — Z471 Aftercare following joint replacement surgery: Secondary | ICD-10-CM | POA: Diagnosis not present

## 2022-11-05 DIAGNOSIS — Z96652 Presence of left artificial knee joint: Secondary | ICD-10-CM | POA: Diagnosis not present

## 2022-11-06 DIAGNOSIS — Z471 Aftercare following joint replacement surgery: Secondary | ICD-10-CM | POA: Diagnosis not present

## 2022-11-06 DIAGNOSIS — C50911 Malignant neoplasm of unspecified site of right female breast: Secondary | ICD-10-CM | POA: Diagnosis not present

## 2022-11-06 DIAGNOSIS — Z96652 Presence of left artificial knee joint: Secondary | ICD-10-CM | POA: Diagnosis not present

## 2022-11-10 DIAGNOSIS — Z96652 Presence of left artificial knee joint: Secondary | ICD-10-CM | POA: Diagnosis not present

## 2022-11-10 DIAGNOSIS — Z471 Aftercare following joint replacement surgery: Secondary | ICD-10-CM | POA: Diagnosis not present

## 2022-11-10 DIAGNOSIS — M2560 Stiffness of unspecified joint, not elsewhere classified: Secondary | ICD-10-CM | POA: Diagnosis not present

## 2022-11-10 DIAGNOSIS — R262 Difficulty in walking, not elsewhere classified: Secondary | ICD-10-CM | POA: Diagnosis not present

## 2022-11-10 DIAGNOSIS — M6281 Muscle weakness (generalized): Secondary | ICD-10-CM | POA: Diagnosis not present

## 2022-11-10 DIAGNOSIS — M1712 Unilateral primary osteoarthritis, left knee: Secondary | ICD-10-CM | POA: Diagnosis not present

## 2022-11-12 ENCOUNTER — Telehealth: Payer: Self-pay | Admitting: Licensed Clinical Social Worker

## 2022-11-12 NOTE — Patient Outreach (Signed)
  Care Coordination   11/12/2022 Name: Victoria Walker MRN: [id number] DOB: [birth year]   Care Coordination Outreach Attempts:  An unsuccessful telephone outreach was attempted today to offer the patient information about available care coordination services.  Follow Up Plan:  Additional outreach attempts will be made to offer the patient care coordination information and services.   Encounter Outcome:  No Answer   Care Coordination Interventions:  No, not indicated    Sammuel Hines, LCSW Social Work Care Coordination  [name] [employer] [phone]

## 2022-11-13 ENCOUNTER — Ambulatory Visit: Payer: Self-pay | Admitting: Licensed Clinical Social Worker

## 2022-11-13 NOTE — Patient Instructions (Signed)
Social Work Visit Information  Thank you for taking time to visit with me today. Please don't hesitate to contact me if I can be of assistance to you.   Following are the goals we discussed today:   Goals Addressed             This Visit's Progress    connect with RN for diabetes education       Activities and task to complete in order to accomplish goals.   Keep all upcoming appointment discussed today Continue with compliance of taking medication prescribed by Doctor I have scheduled a phone appointment with the RN Care Manager she will provide educational informational and assist you with managing your health needs        Our next appointment is by telephone on 01/26/23    Please call the care guide team at [phone] if you need to cancel or reschedule your appointment.   If you or anyone you know are experiencing a Mental Health or Behavioral Health Crisis or need someone to talk to, please call the Suicide and Crisis Lifeline: 988 call the [place] National Suicide Prevention Lifeline: (567) 758-9943 or TTY: (581)042-2920 TTY 4436406744) to talk to a trained counselor call 1-800-273-TALK (toll free, 24 hour hotline) go to [facility] [address] [place] [phone])   Patient verbalizes understanding of instructions and care plan provided today and agrees to view in MyChart. Active MyChart status and patient understanding of how to access instructions and care plan via MyChart confirmed with patient.       Sammuel Hines, LCSW Social Work Care Coordination  [facility] [employer] [phone]

## 2022-11-13 NOTE — Patient Outreach (Signed)
  Care Coordination  Initial Visit Note   11/13/2022 Name: Victoria Walker MRN: [id number] DOB: 1950/09/09  Victoria Walker is a 72 y.o. year old female who sees Bradd Canary, MD for primary care. I spoke with  Victoria Walker by phone today.  What matters to the patients health and wellness today?  Managing her diabetes  Patient reports lots of appointments with PT related to knee surgery.  Wants to feel better and get back to doing the things she enjoys. Would like to connect with LCSW after she has completed all PT.  Will work with RN starting in Aug.   Goals Addressed             This Visit's Progress    connect with RN for diabetes education       Activities and task to complete in order to accomplish goals.   Keep all upcoming appointment discussed today Continue with compliance of taking medication prescribed by Doctor I have scheduled a phone appointment with the RN Care Manager she will provide educational informational and assist you with managing your health needs        SDOH assessments and interventions completed:  Yes  SDOH Interventions Today    Flowsheet Row Most Recent Value  SDOH Interventions   Food Insecurity Interventions Intervention Not Indicated  Housing Interventions Intervention Not Indicated  Transportation Interventions Intervention Not Indicated  Utilities Interventions Intervention Not Indicated  Financial Strain Interventions Intervention Not Indicated  Stress Interventions Intervention Not Indicated       Care Coordination Interventions:  Yes, provided  Interventions Today    Flowsheet Row Most Recent Value  Chronic Disease   Chronic disease during today's visit Diabetes, Hypertension (HTN)  General Interventions   General Interventions Discussed/Reviewed General Interventions Discussed, Referral to Nurse  [reviewed care coordination program]  Education Interventions   Education Provided Provided Education  Mental Health  Interventions   Mental Health Discussed/Reviewed Mental Health Discussed  [stress related to health]  Nutrition Interventions   Nutrition Discussed/Reviewed Nutrition Discussed  Pharmacy Interventions   Pharmacy Dicussed/Reviewed Pharmacy Topics Discussed  [no needs identified]  Advanced Directive Interventions   Advanced Directives Discussed/Reviewed Advanced Directives Discussed  [has documents]       Follow up plan:  Referral made to RN for diabetes education and reducing numbers Follow up call scheduled for Oct with LCSW per patients request    Encounter Outcome:  Pt. Visit Completed   Sammuel Hines, LCSW Social Work Care Coordination  [facility] [employer] [phone]

## 2022-11-14 DIAGNOSIS — R262 Difficulty in walking, not elsewhere classified: Secondary | ICD-10-CM | POA: Diagnosis not present

## 2022-11-14 DIAGNOSIS — M1712 Unilateral primary osteoarthritis, left knee: Secondary | ICD-10-CM | POA: Diagnosis not present

## 2022-11-14 DIAGNOSIS — M6281 Muscle weakness (generalized): Secondary | ICD-10-CM | POA: Diagnosis not present

## 2022-11-14 DIAGNOSIS — Z96652 Presence of left artificial knee joint: Secondary | ICD-10-CM | POA: Diagnosis not present

## 2022-11-14 DIAGNOSIS — Z471 Aftercare following joint replacement surgery: Secondary | ICD-10-CM | POA: Diagnosis not present

## 2022-11-14 DIAGNOSIS — M2560 Stiffness of unspecified joint, not elsewhere classified: Secondary | ICD-10-CM | POA: Diagnosis not present

## 2022-11-17 DIAGNOSIS — M6281 Muscle weakness (generalized): Secondary | ICD-10-CM | POA: Diagnosis not present

## 2022-11-17 DIAGNOSIS — M2560 Stiffness of unspecified joint, not elsewhere classified: Secondary | ICD-10-CM | POA: Diagnosis not present

## 2022-11-17 DIAGNOSIS — M1712 Unilateral primary osteoarthritis, left knee: Secondary | ICD-10-CM | POA: Diagnosis not present

## 2022-11-17 DIAGNOSIS — Z96652 Presence of left artificial knee joint: Secondary | ICD-10-CM | POA: Diagnosis not present

## 2022-11-17 DIAGNOSIS — R262 Difficulty in walking, not elsewhere classified: Secondary | ICD-10-CM | POA: Diagnosis not present

## 2022-11-17 DIAGNOSIS — Z471 Aftercare following joint replacement surgery: Secondary | ICD-10-CM | POA: Diagnosis not present

## 2022-11-19 ENCOUNTER — Encounter (INDEPENDENT_AMBULATORY_CARE_PROVIDER_SITE_OTHER): Payer: Self-pay

## 2022-11-19 DIAGNOSIS — E1140 Type 2 diabetes mellitus with diabetic neuropathy, unspecified: Secondary | ICD-10-CM | POA: Diagnosis not present

## 2022-11-20 ENCOUNTER — Other Ambulatory Visit (HOSPITAL_BASED_OUTPATIENT_CLINIC_OR_DEPARTMENT_OTHER): Payer: Self-pay

## 2022-11-20 ENCOUNTER — Other Ambulatory Visit: Payer: Self-pay | Admitting: Family Medicine

## 2022-11-20 DIAGNOSIS — Z471 Aftercare following joint replacement surgery: Secondary | ICD-10-CM | POA: Diagnosis not present

## 2022-11-20 DIAGNOSIS — M1712 Unilateral primary osteoarthritis, left knee: Secondary | ICD-10-CM | POA: Diagnosis not present

## 2022-11-20 DIAGNOSIS — R262 Difficulty in walking, not elsewhere classified: Secondary | ICD-10-CM | POA: Diagnosis not present

## 2022-11-20 DIAGNOSIS — Z96652 Presence of left artificial knee joint: Secondary | ICD-10-CM | POA: Diagnosis not present

## 2022-11-20 DIAGNOSIS — M2560 Stiffness of unspecified joint, not elsewhere classified: Secondary | ICD-10-CM | POA: Diagnosis not present

## 2022-11-20 DIAGNOSIS — M6281 Muscle weakness (generalized): Secondary | ICD-10-CM | POA: Diagnosis not present

## 2022-11-20 MED ORDER — AVAPRO 75 MG PO TABS
75.0000 mg | ORAL_TABLET | Freq: Every day | ORAL | 0 refills | Status: DC
  Filled 2022-11-20: qty 90, 90d supply, fill #0

## 2022-11-21 ENCOUNTER — Other Ambulatory Visit (HOSPITAL_BASED_OUTPATIENT_CLINIC_OR_DEPARTMENT_OTHER): Payer: Self-pay

## 2022-11-24 DIAGNOSIS — R262 Difficulty in walking, not elsewhere classified: Secondary | ICD-10-CM | POA: Diagnosis not present

## 2022-11-24 DIAGNOSIS — Z96652 Presence of left artificial knee joint: Secondary | ICD-10-CM | POA: Diagnosis not present

## 2022-11-24 DIAGNOSIS — M1712 Unilateral primary osteoarthritis, left knee: Secondary | ICD-10-CM | POA: Diagnosis not present

## 2022-11-24 DIAGNOSIS — Z471 Aftercare following joint replacement surgery: Secondary | ICD-10-CM | POA: Diagnosis not present

## 2022-11-24 DIAGNOSIS — M6281 Muscle weakness (generalized): Secondary | ICD-10-CM | POA: Diagnosis not present

## 2022-11-24 DIAGNOSIS — M2560 Stiffness of unspecified joint, not elsewhere classified: Secondary | ICD-10-CM | POA: Diagnosis not present

## 2022-11-27 DIAGNOSIS — R262 Difficulty in walking, not elsewhere classified: Secondary | ICD-10-CM | POA: Diagnosis not present

## 2022-11-27 DIAGNOSIS — Z471 Aftercare following joint replacement surgery: Secondary | ICD-10-CM | POA: Diagnosis not present

## 2022-11-27 DIAGNOSIS — M6281 Muscle weakness (generalized): Secondary | ICD-10-CM | POA: Diagnosis not present

## 2022-11-27 DIAGNOSIS — Z96652 Presence of left artificial knee joint: Secondary | ICD-10-CM | POA: Diagnosis not present

## 2022-11-27 DIAGNOSIS — M1712 Unilateral primary osteoarthritis, left knee: Secondary | ICD-10-CM | POA: Diagnosis not present

## 2022-11-27 DIAGNOSIS — M2560 Stiffness of unspecified joint, not elsewhere classified: Secondary | ICD-10-CM | POA: Diagnosis not present

## 2022-12-01 ENCOUNTER — Other Ambulatory Visit (HOSPITAL_BASED_OUTPATIENT_CLINIC_OR_DEPARTMENT_OTHER): Payer: Self-pay

## 2022-12-01 ENCOUNTER — Telehealth: Payer: Self-pay

## 2022-12-01 DIAGNOSIS — N898 Other specified noninflammatory disorders of vagina: Secondary | ICD-10-CM

## 2022-12-01 MED ORDER — FLUCONAZOLE 150 MG PO TABS
150.0000 mg | ORAL_TABLET | Freq: Every day | ORAL | 0 refills | Status: DC
Start: 2022-12-01 — End: 2022-12-31
  Filled 2022-12-01: qty 2, 3d supply, fill #0

## 2022-12-01 NOTE — Telephone Encounter (Signed)
Patient called requesting a Rx for yeast. Patient states she had knee surgery and is taking antibiotics. Patient is unable to drive due to surgery.Diflucan 150 mg PO once was sent to her pharmacy. Understanding was voiced.  l , CMA

## 2022-12-02 DIAGNOSIS — M6281 Muscle weakness (generalized): Secondary | ICD-10-CM | POA: Diagnosis not present

## 2022-12-02 DIAGNOSIS — Z96652 Presence of left artificial knee joint: Secondary | ICD-10-CM | POA: Diagnosis not present

## 2022-12-02 DIAGNOSIS — Z471 Aftercare following joint replacement surgery: Secondary | ICD-10-CM | POA: Diagnosis not present

## 2022-12-02 DIAGNOSIS — R262 Difficulty in walking, not elsewhere classified: Secondary | ICD-10-CM | POA: Diagnosis not present

## 2022-12-02 DIAGNOSIS — M1712 Unilateral primary osteoarthritis, left knee: Secondary | ICD-10-CM | POA: Diagnosis not present

## 2022-12-02 DIAGNOSIS — M2560 Stiffness of unspecified joint, not elsewhere classified: Secondary | ICD-10-CM | POA: Diagnosis not present

## 2022-12-03 ENCOUNTER — Ambulatory Visit (INDEPENDENT_AMBULATORY_CARE_PROVIDER_SITE_OTHER): Payer: Medicare Other | Admitting: *Deleted

## 2022-12-03 VITALS — BP 115/68 | HR 73

## 2022-12-03 DIAGNOSIS — Z0000 Encounter for general adult medical examination without abnormal findings: Secondary | ICD-10-CM

## 2022-12-03 NOTE — Progress Notes (Signed)
Subjective:   Victoria Walker is a 72 y.o. female who presents for Medicare Annual (Subsequent) preventive examination.  Visit Complete: Virtual  I connected with  Norm Salt on 12/03/22 by a audio enabled telemedicine application and verified that I am speaking with the correct person using two identifiers.  Patient Location: Home  Provider Location: Office/Clinic  I discussed the limitations of evaluation and management by telemedicine. The patient expressed understanding and agreed to proceed.   Review of Systems     Cardiac Risk Factors include: advanced age (>15men, >54 women);diabetes mellitus;dyslipidemia;hypertension     Objective:   Pt reported vitals. Today's Vitals   12/03/22 1111  BP: 115/68  Pulse: 73   There is no height or weight on file to calculate BMI.     12/03/2022   11:01 AM 11/11/2021    9:05 AM 07/30/2021    1:30 PM 11/07/2020    3:44 PM 03/21/2019    8:57 AM 05/17/2018   10:32 AM 03/11/2018    8:53 AM  Advanced Directives  Does Patient Have a Medical Advance Directive? Yes Yes Yes Yes Yes Yes Yes  Type of Estate agent of [place];Living will Healthcare Power of [place];Living will Healthcare Power of [place];Living will Healthcare Power of [place];Living will Healthcare Power of [place];Living will  Healthcare Power of [place];Living will  Does patient want to make changes to medical advance directive? No - Patient declined No - Patient declined   No - Patient declined No - Patient declined   Copy of Healthcare Power of Attorney in Chart? Yes - validated most recent copy scanned in chart (See row information) Yes - validated most recent copy scanned in chart (See row information) No - copy requested Yes - validated most recent copy scanned in chart (See row information) Yes - validated most recent copy scanned in chart (See row information)  Yes - validated most recent copy scanned in chart (See row information)     Current Medications (verified) Outpatient Encounter Medications as of 12/03/2022  Medication Sig   alendronate (FOSAMAX) 70 MG tablet Take 1 tablet (70 mg total) by mouth once a week. Take with a full glass of water on an empty stomach.   aspirin 81 MG EC tablet Take 81 mg by mouth every other day. Swallow whole.   atorvastatin (LIPITOR) 40 MG tablet TAKE 1 TABLET BY MOUTH EVERY OTHER DAY, ALTERNATING WITH 1/2 TABLET   AVAPRO 75 MG tablet Take 1 tablet (75 mg total) by mouth daily.   Calcium Carbonate-Vitamin D (OS-CAL 500 + D PO)    Calcium Polycarbophil (FIBER) 625 MG TABS Take 1 tablet by mouth daily.   Carboxymethylcellul-Glycerin (REFRESH OPTIVE OP) Apply to eye.   Cholecalciferol (VITAMIN D) 2000 UNITS CAPS Take 1 capsule by mouth every evening.    Ciclopirox 1 % shampoo Apply 1(one) application(s) topically 2(two) times a week   cycloSPORINE (RESTASIS MULTIDOSE) 0.05 % ophthalmic emulsion Place 1 drop into both eyes 2 (two) times daily.   cycloSPORINE (RESTASIS) 0.05 % ophthalmic emulsion Place 1 drop into both eyes 2 (two) times daily.   diltiazem (TIADYLT ER) 360 MG 24 hr capsule Take 1 capsule (360 mg total) by mouth daily.   Estradiol 10 MCG TABS vaginal tablet Place 1 tablet (10 mcg total) vaginally every other day.   fluconazole (DIFLUCAN) 150 MG tablet Take 1 tablet (150 mg total) by mouth daily. May repeat dose in 3 days if symptoms do not improve   gabapentin (  NEURONTIN) 300 MG capsule Take 1 capsule (300 mg total) by mouth at bedtime.   glucose blood (ONE TOUCH TEST STRIPS) test strip Use as directed twice daily to check blood sugar. DX code E11.9   levocetirizine (XYZAL) 5 MG tablet Take 1 tablet (5 mg total) by mouth every evening.   levothyroxine (SYNTHROID) 88 MCG tablet Take 1 tablet (88 mcg total) by mouth daily. **SKIP SUNDAY**   loteprednol (LOTEMAX) 0.5 % ophthalmic suspension Place one drop into both eyes four times daily with flare up.   loteprednol (LOTEMAX)  0.5 % ophthalmic suspension Place 1 drop into both eyes 4 (four) times daily as needed with flare up   metFORMIN (GLUCOPHAGE-XR) 750 MG 24 hr tablet Take 1 tablet (750 mg total) by mouth in the morning and at bedtime.   metoprolol succinate (TOPROL-XL) 25 MG 24 hr tablet Take 1 tablet (25 mg total) by mouth 2 (two) times daily.   mometasone (NASONEX) 50 MCG/ACT nasal spray Place 1-2 sprays in each nostril daily as needed for stuffy nose.   NEXIUM 40 MG capsule Take 1 capsule (40 mg total) by mouth daily.   ondansetron (ZOFRAN) 4 MG tablet Take 1 tablet (4 mg total) by mouth every 8 (eight) hours as needed for nausea for up to 7 days.   oxyCODONE (OXY IR/ROXICODONE) 5 MG immediate release tablet Take 1 tablet (5 mg total) by mouth every 4 (four) hours as needed for moderate pain (4-6) for up to 7 days.   Probiotic Product (PROBIOTIC DAILY PO) Take 1 capsule by mouth daily.   RSV vaccine recomb adjuvanted (AREXVY) 120 MCG/0.5ML injection Inject into the muscle.   senna-docusate (SENOKOT-S) 8.6-50 MG tablet Take 2 tablets by mouth daily for 14 days.   spironolactone (ALDACTONE) 25 MG tablet Take 1.5 tablets (37.5 mg total) by mouth daily.   traMADol (ULTRAM) 50 MG tablet Take 1 tablet (50 mg total) by mouth every 6 (six) hours as needed for moderate pain (4-6) for up to 7 days.   traMADol (ULTRAM) 50 MG tablet Take 1 tablet (50 mg total) by mouth every 6 (six) hours as needed moderate pain (4-6) for up to 7 days.   Vitamin D, Ergocalciferol, (DRISDOL) 1.25 MG (50000 UNIT) CAPS capsule Take 1 capsule (50,000 Units total) by mouth once a week for 6 doses.   No facility-administered encounter medications on file as of 12/03/2022.    Allergies (verified) Codeine, Penicillins, Sulfa antibiotics, and Vicodin [hydrocodone-acetaminophen]   History: Past Medical History:  Diagnosis Date   Arthritis    "both knees" (07/02/2015)   Cancer of right breast (HCC)    Claustrophobia    Dysrhythmia    HX OF  FAST HEART RATE AND PALPITATIONS - METOPROLOL HAS HELPED   GERD (gastroesophageal reflux disease)    H/O hiatal hernia    H/O iritis    LEFT EYE - STATES HER EYE BECOMES RED AND VERY SENSITIVE TO LIGHT WHEN FLARE UP OF IRITIS   Heart murmur    "benign; 06/2015"   High cholesterol    History of blood transfusion    "when I had my partial hysterectomy"   History of chickenpox 09/26/2014   History of measles    History of MRSA infection    History of shingles    Hypertension    Hypothyroidism    Medicare annual wellness visit, initial 04/07/2015   Multinodular thyroid    PT HAVING TROUBLE SWALLOWING   Neuropathy    FEET   Primary  localized osteoarthritis of right knee    Sciatica of right side 04/25/2015   Spinal headache    spinal headache with epidural   Total knee replacement status, right    Type II diabetes mellitus (HCC)    ORAL MEDICATION - NO INSULIN   Wears glasses    Past Surgical History:  Procedure Laterality Date   ABDOMINAL HYSTERECTOMY  1988   ABDOMINOPLASTY  1986   ANTERIOR AND POSTERIOR REPAIR  05/11/2012   Procedure: ANTERIOR (CYSTOCELE) AND POSTERIOR REPAIR (RECTOCELE);  Surgeon: Martina Sinner, MD;  Location: [facility];  Service: Urology;;  with graft   BREAST BIOPSY Right    CARDIAC CATHETERIZATION  1999   COLONOSCOPY     CYSTOSCOPY WITH URETHRAL DILATATION  05/11/2012   Procedure: CYSTOSCOPY WITH URETHRAL DILATATION;  Surgeon: Martina Sinner, MD;  Location: [facility];  Service: Urology;;   ESOPHAGEAL MANOMETRY N/A 10/09/2021   Procedure: ESOPHAGEAL MANOMETRY (EM);  Surgeon: Lemar Lofty., MD;  Location: [facility];  Service: Gastroenterology;  Laterality: N/A;   ESOPHAGOGASTRODUODENOSCOPY     HERNIA REPAIR     MASTECTOMY Right 2007   PLANTAR FASCIA SURGERY Left    THYROIDECTOMY N/A 02/23/2014   Procedure: TOTAL THYROIDECTOMY;  Surgeon: Darnell Level, MD;  Location: [facility];  Service: General;  Laterality: N/A;   TOTAL KNEE ARTHROPLASTY Right  07/02/2015   TOTAL KNEE ARTHROPLASTY Right 07/02/2015   Procedure: TOTAL KNEE ARTHROPLASTY;  Surgeon: Salvatore Marvel, MD;  Location: [facility];  Service: Orthopedics;  Laterality: Right;   TOTAL THYROIDECTOMY  1976   removed three tumor (2 on right; 1 on left)   TUBAL LIGATION     UMBILICAL HERNIA REPAIR  1986   VAGINAL PROLAPSE REPAIR  05/11/2012   Procedure: VAGINAL VAULT SUSPENSION;  Surgeon: Martina Sinner, MD;  Location: [facility];  Service: Urology;;   VULVA / PERINEUM BIOPSY     vulvar   Family History  Problem Relation Age of Onset   Hypertension Maternal Grandmother    Heart disease Maternal Grandmother    Stroke Maternal Grandmother    Hypertension Maternal Grandfather    Hypertension Father    Diabetes Father    Alcohol abuse Father    Lung cancer Father        lung, psa   Hypertension Mother    Heart disease Mother        pacer, CHF   Hyperlipidemia Mother    Kidney disease Mother        1 kidney due to stone   Kidney Stones Mother    Diabetes Brother    Hypertension Brother    Hyperlipidemia Brother    Kidney disease Brother        dialysis   Stroke Brother    Hypertension Brother    Hyperlipidemia Brother    Hypertension Brother    Hyperlipidemia Brother    Benign prostatic hyperplasia Brother    Diabetes Sister    Hypertension Sister    Breast cancer Sister    Hyperlipidemia Sister    Hypertension Sister    Diabetes Sister    Thyroid disease Sister    Kidney disease Sister    Endometriosis Sister    Breast cancer Sister        thyroid and breast   Thyroid cancer Sister    Arthritis Sister        back pain   Hypertension Sister    Thyroid disease Sister    Hypertension Daughter  Hypertension Daughter    Hyperlipidemia Daughter    Diabetes Daughter    Arthritis Son    Hypertension Maternal Aunt    Hypertension Maternal Uncle    Hypertension Paternal Aunt    Cancer Paternal Aunt    Hypertension Paternal Uncle    Colon cancer Neg Hx    Stomach  cancer Neg Hx    Esophageal cancer Neg Hx    Pancreatic cancer Neg Hx    Liver disease Neg Hx    Rectal cancer Neg Hx    Inflammatory bowel disease Neg Hx    Social History   Socioeconomic History   Marital status: Married    Spouse name: Not on file   Number of children: 3   Years of education: Not on file   Highest education level: Not on file  Occupational History    Comment: Retired  Tobacco Use   Smoking status: Never   Smokeless tobacco: Never  Vaping Use   Vaping status: Never Used  Substance and Sexual Activity   Alcohol use: No    Alcohol/week: 0.0 standard drinks of alcohol   Drug use: No   Sexual activity: Not Currently    Birth control/protection: Surgical    Comment: lives with husband, retired from various jobs, diabetic diet  Other Topics Concern   Not on file  Social History Narrative   Not on file   Social Determinants of Health   Financial Resource Strain: Low Risk  (11/13/2022)   Overall Financial Resource Strain (CARDIA)    Difficulty of Paying Living Expenses: Not hard at all  Food Insecurity: No Food Insecurity (11/13/2022)   Hunger Vital Sign    Worried About Running Out of Food in the Last Year: Never true    Ran Out of Food in the Last Year: Never true  Transportation Needs: No Transportation Needs (11/13/2022)   PRAPARE - Administrator, Civil Service (Medical): No    Lack of Transportation (Non-Medical): No  Physical Activity: Inactive (12/03/2022)   Exercise Vital Sign    Days of Exercise per Week: 0 days    Minutes of Exercise per Session: 0 min  Stress: No Stress Concern Present (11/13/2022)   [employer] of Occupational Health - Occupational Stress Questionnaire    Feeling of Stress : Only a little  Social Connections: Socially Integrated (12/03/2022)   Social Connection and Isolation Panel [NHANES]    Frequency of Communication with Friends and Family: More than three times a week    Frequency of Social Gatherings  with Friends and Family: Once a week    Attends Religious Services: More than 4 times per year    Active Member of [employer] or Organizations: Yes    Attends Engineer, structural: More than 4 times per year    Marital Status: Married    Tobacco Counseling Counseling given: Not Answered   Clinical Intake:  Pre-visit preparation completed: Yes  Pain : No/denies pain  Nutritional Risks: None Diabetes: Yes CBG done?: No Did pt. bring in CBG monitor from home?: No  How often do you need to have someone help you when you read instructions, pamphlets, or other written materials from your doctor or pharmacy?: 1 - Never  Interpreter Needed?: No  Information entered by :: Donne Anon, CMA   Activities of Daily Living    12/03/2022   11:03 AM  In your present state of health, do you have any difficulty performing the following activities:  Hearing? 0  Vision? 0  Difficulty concentrating or making decisions? 1  Comment some slight difficulty remembering things  Walking or climbing stairs? 0  Dressing or bathing? 0  Doing errands, shopping? 0  Preparing Food and eating ? N  Using the Toilet? N  In the past six months, have you accidently leaked urine? N  Do you have problems with loss of bowel control? N  Managing your Medications? N  Managing your Finances? N  Housekeeping or managing your Housekeeping? N    Patient Care Team: Bradd Canary, MD as PCP - General (Family Medicine) Charna Elizabeth, MD as Consulting Physician (Gastroenterology) Seldomridge, Lauro Regulus, MD (Ophthalmology) Sheard, Joline Maxcy, DPM (Inactive) as Consulting Physician (Podiatry) Larence Penning, OD as Referring Physician (Optometry) Fletcher Anon, MD (Inactive) as Consulting Physician (Allergy and Immunology) Willodean Rosenthal, MD as Consulting Physician (Obstetrics and Gynecology) Salvatore Marvel, MD as Consulting Physician (Orthopedic Surgery) Mammography, [facility] (Diagnostic  Radiology) Soundra Pilon, LCSW as Social Worker (Licensed Clinical Social Worker)  Indicate any recent [employer] you may have received from other than [facility] providers in the past year (date may be approximate).     Assessment:   This is a routine wellness examination for [facility].  Hearing/Vision screen No results found.  Dietary issues and exercise activities discussed:     Goals Addressed   None    Depression Screen    12/03/2022   11:15 AM 08/06/2022    1:59 PM 05/01/2022   10:41 AM 01/09/2022    9:02 AM 11/11/2021    9:07 AM 07/08/2021   10:28 AM 03/08/2021    1:02 PM  PHQ 2/9 Scores  PHQ - 2 Score 0 0 0 0 0 0 0  PHQ- 9 Score  1  0       Fall Risk    12/03/2022   11:06 AM 05/01/2022   10:40 AM 01/09/2022    9:01 AM 11/11/2021    9:06 AM 07/08/2021   10:28 AM  Fall Risk   Falls in the past year? 0 0 1 1 0  Number falls in past yr: 0 0 0 0 0  Injury with Fall? 0 0 0 1 0  Risk for fall due to : No Fall Risks   Impaired balance/gait No Fall Risks  Follow up Falls evaluation completed Falls evaluation completed Falls evaluation completed Falls evaluation completed Falls evaluation completed    MEDICARE RISK AT HOME:  Medicare Risk at Home - 12/03/22 1105     Any stairs in or around the home? Yes    If so, are there any without handrails? No    Home free of loose throw rugs in walkways, pet beds, electrical cords, etc? Yes    Adequate lighting in your home to reduce risk of falls? Yes    Life alert? No    Use of a cane, walker or w/c? Yes   recent knee replacement   Grab bars in the bathroom? Yes    Shower chair or bench in shower? Yes    Elevated toilet seat or a handicapped toilet? Yes   comfort height            TIMED UP AND GO:  Was the test performed?  No    Cognitive Function:    03/10/2017    9:34 AM 02/25/2016   10:56 AM 02/22/2015    9:56 AM  MMSE - Mini Mental State Exam  Orientation to time 5 5 5   Orientation to Place  5 5 5    Registration 3 3 3   Attention/ Calculation 5 5 5   Recall 3 3 1   Language- name 2 objects 2 2 2   Language- repeat 1 1 1   Language- follow 3 step command 3 3 3   Language- read & follow direction 1 1 1   Write a sentence 1 1 1   Copy design 1 0 1  Total score 30 29 28         12/03/2022   11:17 AM 11/11/2021    9:17 AM  6CIT Screen  What Year? 0 points 0 points  What month? 0 points 0 points  What time? 0 points 0 points  Count back from 20 0 points 0 points  Months in reverse 0 points 0 points  Repeat phrase 0 points 0 points  Total Score 0 points 0 points    Immunizations Immunization History  Administered Date(s) Administered   COVID-19, mRNA, vaccine(Comirnaty)12 years and older 02/24/2022   Fluad Quad(high Dose 65+) 12/17/2018, 01/21/2021, 01/09/2022   Influenza Whole 01/28/2017   Influenza, High Dose Seasonal PF 01/21/2016, 02/04/2017, 01/28/2018   Influenza,inj,Quad PF,6+ Mos 01/04/2013, 01/23/2014, 12/28/2014   PFIZER Comirnaty(Gray Top)Covid-19 Tri-Sucrose Vaccine 10/26/2020   PFIZER(Purple Top)SARS-COV-2 Vaccination 06/12/2019, 07/06/2019, 02/06/2020   Pfizer Covid-19 Vaccine Bivalent Booster 21yrs & up 02/05/2021   Pneumococcal Conjugate-13 12/28/2014   Pneumococcal Polysaccharide-23 05/12/2012, 10/27/2017   Respiratory Syncytial Virus Vaccine,Recomb Aduvanted(Arexvy) 05/06/2022   Tdap 05/02/2014   Zoster Recombinant(Shingrix) 01/15/2018, 03/25/2018    TDAP status: Up to date  Flu Vaccine status: Due, Education has been provided regarding the importance of this vaccine. Advised may receive this vaccine at local pharmacy or Health Dept. Aware to provide a copy of the vaccination record if obtained from local pharmacy or Health Dept. Verbalized acceptance and understanding.  Pneumococcal vaccine status: Up to date  Covid-19 vaccine status: Information provided on how to obtain vaccines.   Qualifies for Shingles Vaccine? Yes   Zostavax completed No   Shingrix  Completed?: Yes  Screening Tests Health Maintenance  Topic Date Due   COVID-19 Vaccine (7 - 2023-24 season) 04/21/2022   Medicare Annual Wellness (AWV)  11/12/2022   INFLUENZA VACCINE  11/20/2022   Colonoscopy  02/14/2023   MAMMOGRAM  12/07/2022   FOOT EXAM  12/11/2022   HEMOGLOBIN A1C  03/11/2023   Diabetic kidney evaluation - Urine ACR  05/02/2023   OPHTHALMOLOGY EXAM  08/22/2023   Diabetic kidney evaluation - eGFR measurement  09/08/2023   DTaP/Tdap/Td (2 - Td or Tdap) 05/02/2024   Pneumonia Vaccine 9+ Years old  Completed   DEXA SCAN  Completed   Hepatitis C Screening  Completed   Zoster Vaccines- Shingrix  Completed   HPV VACCINES  Aged Out    Health Maintenance  Health Maintenance Due  Topic Date Due   COVID-19 Vaccine (7 - 2023-24 season) 04/21/2022   Medicare Annual Wellness (AWV)  11/12/2022   INFLUENZA VACCINE  11/20/2022   Colonoscopy  02/14/2023    Colorectal cancer screening: Type of screening: Colonoscopy. Completed 02/13/30. Repeat every 3 years  Mammogram status: Completed 12/07/22. Repeat every year  Bone Density status: Completed 07/11/21. Results reflect: Bone density results: OSTEOPOROSIS. Repeat every 2 years.  Lung Cancer Screening: (Low Dose CT Chest recommended if Age 2-80 years, 20 pack-year currently smoking OR have quit w/in 15years.) does not qualify.   Additional Screening:  Hepatitis C Screening: does qualify; Completed 02/22/15  Vision Screening: Recommended annual ophthalmology exams for early detection of glaucoma and other disorders  of the eye. Is the patient up to date with their annual eye exam?  Yes  Who is the provider or what is the name of the office in which the patient attends annual eye exams? [facility] If pt is not established with a provider, would they like to be referred to a provider to establish care? No .   Dental Screening: Recommended annual dental exams for proper oral hygiene  Diabetic Foot Exam:  Diabetic Foot Exam: Completed 12/10/21  Community Resource Referral / Chronic Care Management: CRR required this visit?  No   CCM required this visit?  No     Plan:     I have personally reviewed and noted the following in the patient's chart:   Medical and social history Use of alcohol, tobacco or illicit drugs  Current medications and supplements including opioid prescriptions. Patient is currently taking opioid prescriptions. Information provided to patient regarding non-opioid alternatives. Patient advised to discuss non-opioid treatment plan with their provider. Functional ability and status Nutritional status Physical activity Advanced directives List of other physicians Hospitalizations, surgeries, and ER visits in previous 12 months Vitals Screenings to include cognitive, depression, and falls Referrals and appointments  In addition, I have reviewed and discussed with patient certain preventive protocols, quality metrics, and best practice recommendations. A written personalized care plan for preventive services as well as general preventive health recommendations were provided to patient.     Donne Anon, CMA   12/03/2022   After Visit Summary: (MyChart) Due to this being a telephonic visit, the after visit summary with patients personalized plan was offered to patient via MyChart   Nurse Notes: None

## 2022-12-03 NOTE — Patient Instructions (Signed)
Victoria Walker , Thank you for taking time to come for your Medicare Wellness Visit. I appreciate your ongoing commitment to your health goals. Please review the following plan we discussed and let me know if I can assist you in the future.     This is a list of the screening recommended for you and due dates:  Health Maintenance  Topic Date Due   COVID-19 Vaccine (7 - 2023-24 season) 04/21/2022   Flu Shot  11/20/2022   Colon Cancer Screening  02/14/2023   Mammogram  12/07/2022   Complete foot exam   12/11/2022   Hemoglobin A1C  03/11/2023   Yearly kidney health urinalysis for diabetes  05/02/2023   Eye exam for diabetics  08/22/2023   Yearly kidney function blood test for diabetes  09/08/2023   Medicare Annual Wellness Visit  12/03/2023   DTaP/Tdap/Td vaccine (2 - Td or Tdap) 05/02/2024   Pneumonia Vaccine  Completed   DEXA scan (bone density measurement)  Completed   Hepatitis C Screening  Completed   Zoster (Shingles) Vaccine  Completed   HPV Vaccine  Aged Out    Next appointment: Follow up in one year for your annual wellness visit.   Preventive Care 46 Years and Older, Female Preventive care refers to lifestyle choices and visits with your health care provider that can promote health and wellness. What does preventive care include? A yearly physical exam. This is also called an annual well check. Dental exams once or twice a year. Routine eye exams. Ask your health care provider how often you should have your eyes checked. Personal lifestyle choices, including: Daily care of your teeth and gums. Regular physical activity. Eating a healthy diet. Avoiding tobacco and drug use. Limiting alcohol use. Practicing safe sex. Taking low-dose aspirin every day. Taking vitamin and mineral supplements as recommended by your health care provider. What happens during an annual well check? The services and screenings done by your health care provider during your annual well check  will depend on your age, overall health, lifestyle risk factors, and family history of disease. Counseling  Your health care provider may ask you questions about your: Alcohol use. Tobacco use. Drug use. Emotional well-being. Home and relationship well-being. Sexual activity. Eating habits. History of falls. Memory and ability to understand (cognition). Work and work Astronomer. Reproductive health. Screening  You may have the following tests or measurements: Height, weight, and BMI. Blood pressure. Lipid and cholesterol levels. These may be checked every 5 years, or more frequently if you are over 53 years old. Skin check. Lung cancer screening. You may have this screening every year starting at age 42 if you have a 30-pack-year history of smoking and currently smoke or have quit within the past 15 years. Fecal occult blood test (FOBT) of the stool. You may have this test every year starting at age 35. Flexible sigmoidoscopy or colonoscopy. You may have a sigmoidoscopy every 5 years or a colonoscopy every 10 years starting at age 109. Hepatitis C blood test. Hepatitis B blood test. Sexually transmitted disease (STD) testing. Diabetes screening. This is done by checking your blood sugar (glucose) after you have not eaten for a while (fasting). You may have this done every 1-3 years. Bone density scan. This is done to screen for osteoporosis. You may have this done starting at age 57. Mammogram. This may be done every 1-2 years. Talk to your health care provider about how often you should have regular mammograms. Talk with your health  care provider about your test results, treatment options, and if necessary, the need for more tests. Vaccines  Your health care provider may recommend certain vaccines, such as: Influenza vaccine. This is recommended every year. Tetanus, diphtheria, and acellular pertussis (Tdap, Td) vaccine. You may need a Td booster every 10 years. Zoster vaccine. You  may need this after age 70. Pneumococcal 13-valent conjugate (PCV13) vaccine. One dose is recommended after age 31. Pneumococcal polysaccharide (PPSV23) vaccine. One dose is recommended after age 58. Talk to your health care provider about which screenings and vaccines you need and how often you need them. This information is not intended to replace advice given to you by your health care provider. Make sure you discuss any questions you have with your health care provider. Document Released: 05/04/2015 Document Revised: 12/26/2015 Document Reviewed: 02/06/2015 Elsevier Interactive Patient Education  2017 [employer].  Fall Prevention in the Home Falls can cause injuries. They can happen to people of all ages. There are many things you can do to make your home safe and to help prevent falls. What can I do on the outside of my home? Regularly fix the edges of walkways and driveways and fix any cracks. Remove anything that might make you trip as you walk through a door, such as a raised step or threshold. Trim any bushes or trees on the path to your home. Use bright outdoor lighting. Clear any walking paths of anything that might make someone trip, such as rocks or tools. Regularly check to see if handrails are loose or broken. Make sure that both sides of any steps have handrails. Any raised decks and porches should have guardrails on the edges. Have any leaves, snow, or ice cleared regularly. Use sand or salt on walking paths during winter. Clean up any spills in your garage right away. This includes oil or grease spills. What can I do in the bathroom? Use night lights. Install grab bars by the toilet and in the tub and shower. Do not use towel bars as grab bars. Use non-skid mats or decals in the tub or shower. If you need to sit down in the shower, use a plastic, non-slip stool. Keep the floor dry. Clean up any water that spills on the floor as soon as it happens. Remove soap buildup  in the tub or shower regularly. Attach bath mats securely with double-sided non-slip rug tape. Do not have throw rugs and other things on the floor that can make you trip. What can I do in the bedroom? Use night lights. Make sure that you have a light by your bed that is easy to reach. Do not use any sheets or blankets that are too big for your bed. They should not hang down onto the floor. Have a firm chair that has side arms. You can use this for support while you get dressed. Do not have throw rugs and other things on the floor that can make you trip. What can I do in the kitchen? Clean up any spills right away. Avoid walking on wet floors. Keep items that you use a lot in easy-to-reach places. If you need to reach something above you, use a strong step stool that has a grab bar. Keep electrical cords out of the way. Do not use floor polish or wax that makes floors slippery. If you must use wax, use non-skid floor wax. Do not have throw rugs and other things on the floor that can make you trip. What can  I do with my stairs? Do not leave any items on the stairs. Make sure that there are handrails on both sides of the stairs and use them. Fix handrails that are broken or loose. Make sure that handrails are as long as the stairways. Check any carpeting to make sure that it is firmly attached to the stairs. Fix any carpet that is loose or worn. Avoid having throw rugs at the top or bottom of the stairs. If you do have throw rugs, attach them to the floor with carpet tape. Make sure that you have a light switch at the top of the stairs and the bottom of the stairs. If you do not have them, ask someone to add them for you. What else can I do to help prevent falls? Wear shoes that: Do not have high heels. Have rubber bottoms. Are comfortable and fit you well. Are closed at the toe. Do not wear sandals. If you use a stepladder: Make sure that it is fully opened. Do not climb a closed  stepladder. Make sure that both sides of the stepladder are locked into place. Ask someone to hold it for you, if possible. Clearly mark and make sure that you can see: Any grab bars or handrails. First and last steps. Where the edge of each step is. Use tools that help you move around (mobility aids) if they are needed. These include: Canes. Walkers. Scooters. Crutches. Turn on the lights when you go into a dark area. Replace any light bulbs as soon as they burn out. Set up your furniture so you have a clear path. Avoid moving your furniture around. If any of your floors are uneven, fix them. If there are any pets around you, be aware of where they are. Review your medicines with your doctor. Some medicines can make you feel dizzy. This can increase your chance of falling. Ask your doctor what other things that you can do to help prevent falls. This information is not intended to replace advice given to you by your health care provider. Make sure you discuss any questions you have with your health care provider. Document Released: 02/01/2009 Document Revised: 09/13/2015 Document Reviewed: 05/12/2014 Elsevier Interactive Patient Education  2017 [employer].

## 2022-12-04 ENCOUNTER — Encounter (INDEPENDENT_AMBULATORY_CARE_PROVIDER_SITE_OTHER): Payer: Self-pay

## 2022-12-04 DIAGNOSIS — R262 Difficulty in walking, not elsewhere classified: Secondary | ICD-10-CM | POA: Diagnosis not present

## 2022-12-04 DIAGNOSIS — Z471 Aftercare following joint replacement surgery: Secondary | ICD-10-CM | POA: Diagnosis not present

## 2022-12-04 DIAGNOSIS — M2560 Stiffness of unspecified joint, not elsewhere classified: Secondary | ICD-10-CM | POA: Diagnosis not present

## 2022-12-04 DIAGNOSIS — M6281 Muscle weakness (generalized): Secondary | ICD-10-CM | POA: Diagnosis not present

## 2022-12-04 DIAGNOSIS — M1712 Unilateral primary osteoarthritis, left knee: Secondary | ICD-10-CM | POA: Diagnosis not present

## 2022-12-09 ENCOUNTER — Telehealth: Payer: Self-pay

## 2022-12-09 DIAGNOSIS — Z471 Aftercare following joint replacement surgery: Secondary | ICD-10-CM | POA: Diagnosis not present

## 2022-12-09 DIAGNOSIS — M2560 Stiffness of unspecified joint, not elsewhere classified: Secondary | ICD-10-CM | POA: Diagnosis not present

## 2022-12-09 DIAGNOSIS — R262 Difficulty in walking, not elsewhere classified: Secondary | ICD-10-CM | POA: Diagnosis not present

## 2022-12-09 DIAGNOSIS — M6281 Muscle weakness (generalized): Secondary | ICD-10-CM | POA: Diagnosis not present

## 2022-12-09 DIAGNOSIS — M1712 Unilateral primary osteoarthritis, left knee: Secondary | ICD-10-CM | POA: Diagnosis not present

## 2022-12-09 NOTE — Patient Outreach (Signed)
  Care Coordination   12/09/2022 Name: Victoria Walker MRN: [id number] DOB: 11/19/1950   Care Coordination Outreach Attempts:  An unsuccessful telephone outreach was attempted for a scheduled appointment today.  Follow Up Plan:  Additional outreach attempts will be made to offer the patient care coordination information and services.   Encounter Outcome:  No Answer   Care Coordination Interventions:  No, not indicated    Kathyrn Sheriff, RN, MSN, BSN, CCM Care Coordinator [phone]

## 2022-12-11 DIAGNOSIS — C50911 Malignant neoplasm of unspecified site of right female breast: Secondary | ICD-10-CM | POA: Diagnosis not present

## 2022-12-11 DIAGNOSIS — M1712 Unilateral primary osteoarthritis, left knee: Secondary | ICD-10-CM | POA: Diagnosis not present

## 2022-12-11 DIAGNOSIS — M2560 Stiffness of unspecified joint, not elsewhere classified: Secondary | ICD-10-CM | POA: Diagnosis not present

## 2022-12-11 DIAGNOSIS — R262 Difficulty in walking, not elsewhere classified: Secondary | ICD-10-CM | POA: Diagnosis not present

## 2022-12-11 DIAGNOSIS — Z471 Aftercare following joint replacement surgery: Secondary | ICD-10-CM | POA: Diagnosis not present

## 2022-12-11 DIAGNOSIS — M6281 Muscle weakness (generalized): Secondary | ICD-10-CM | POA: Diagnosis not present

## 2022-12-16 ENCOUNTER — Other Ambulatory Visit: Payer: Self-pay | Admitting: Family Medicine

## 2022-12-16 ENCOUNTER — Other Ambulatory Visit (HOSPITAL_BASED_OUTPATIENT_CLINIC_OR_DEPARTMENT_OTHER): Payer: Self-pay

## 2022-12-16 ENCOUNTER — Other Ambulatory Visit: Payer: Self-pay

## 2022-12-16 DIAGNOSIS — R262 Difficulty in walking, not elsewhere classified: Secondary | ICD-10-CM | POA: Diagnosis not present

## 2022-12-16 DIAGNOSIS — M2560 Stiffness of unspecified joint, not elsewhere classified: Secondary | ICD-10-CM | POA: Diagnosis not present

## 2022-12-16 DIAGNOSIS — M6281 Muscle weakness (generalized): Secondary | ICD-10-CM | POA: Diagnosis not present

## 2022-12-16 DIAGNOSIS — Z471 Aftercare following joint replacement surgery: Secondary | ICD-10-CM | POA: Diagnosis not present

## 2022-12-16 DIAGNOSIS — M1712 Unilateral primary osteoarthritis, left knee: Secondary | ICD-10-CM | POA: Diagnosis not present

## 2022-12-16 MED ORDER — LEVOTHYROXINE SODIUM 88 MCG PO TABS
88.0000 ug | ORAL_TABLET | Freq: Every day | ORAL | 0 refills | Status: DC
  Filled 2022-12-16: qty 90, 90d supply, fill #0

## 2022-12-16 MED ORDER — METFORMIN HCL ER 750 MG PO TB24
750.0000 mg | ORAL_TABLET | Freq: Two times a day (BID) | ORAL | 0 refills | Status: DC
  Filled 2022-12-16: qty 180, 90d supply, fill #0

## 2022-12-18 DIAGNOSIS — Z96652 Presence of left artificial knee joint: Secondary | ICD-10-CM | POA: Diagnosis not present

## 2022-12-18 DIAGNOSIS — M6281 Muscle weakness (generalized): Secondary | ICD-10-CM | POA: Diagnosis not present

## 2022-12-18 DIAGNOSIS — M1712 Unilateral primary osteoarthritis, left knee: Secondary | ICD-10-CM | POA: Diagnosis not present

## 2022-12-18 DIAGNOSIS — Z471 Aftercare following joint replacement surgery: Secondary | ICD-10-CM | POA: Diagnosis not present

## 2022-12-18 DIAGNOSIS — M2560 Stiffness of unspecified joint, not elsewhere classified: Secondary | ICD-10-CM | POA: Diagnosis not present

## 2022-12-18 DIAGNOSIS — R262 Difficulty in walking, not elsewhere classified: Secondary | ICD-10-CM | POA: Diagnosis not present

## 2022-12-23 DIAGNOSIS — M1712 Unilateral primary osteoarthritis, left knee: Secondary | ICD-10-CM | POA: Diagnosis not present

## 2022-12-23 DIAGNOSIS — M6281 Muscle weakness (generalized): Secondary | ICD-10-CM | POA: Diagnosis not present

## 2022-12-23 DIAGNOSIS — M2560 Stiffness of unspecified joint, not elsewhere classified: Secondary | ICD-10-CM | POA: Diagnosis not present

## 2022-12-23 DIAGNOSIS — Z471 Aftercare following joint replacement surgery: Secondary | ICD-10-CM | POA: Diagnosis not present

## 2022-12-23 DIAGNOSIS — Z96652 Presence of left artificial knee joint: Secondary | ICD-10-CM | POA: Diagnosis not present

## 2022-12-23 DIAGNOSIS — R262 Difficulty in walking, not elsewhere classified: Secondary | ICD-10-CM | POA: Diagnosis not present

## 2022-12-25 ENCOUNTER — Telehealth: Payer: Self-pay | Admitting: *Deleted

## 2022-12-25 DIAGNOSIS — M6281 Muscle weakness (generalized): Secondary | ICD-10-CM | POA: Diagnosis not present

## 2022-12-25 DIAGNOSIS — M1712 Unilateral primary osteoarthritis, left knee: Secondary | ICD-10-CM | POA: Diagnosis not present

## 2022-12-25 DIAGNOSIS — Z96652 Presence of left artificial knee joint: Secondary | ICD-10-CM | POA: Diagnosis not present

## 2022-12-25 DIAGNOSIS — R262 Difficulty in walking, not elsewhere classified: Secondary | ICD-10-CM | POA: Diagnosis not present

## 2022-12-25 DIAGNOSIS — Z471 Aftercare following joint replacement surgery: Secondary | ICD-10-CM | POA: Diagnosis not present

## 2022-12-25 DIAGNOSIS — M2560 Stiffness of unspecified joint, not elsewhere classified: Secondary | ICD-10-CM | POA: Diagnosis not present

## 2022-12-25 NOTE — Progress Notes (Addendum)
  Care Coordination Note  12/25/2022 Name: Victoria Walker MRN: [id number] DOB: 06-16-1950  Victoria Walker is a 72 y.o. year old female who is a primary care patient of Bradd Canary, MD and is actively engaged with the care management team. I reached out to Victoria Walker by phone today to assist with re-scheduling a follow up visit with the RN Case Manager  Follow up plan: We have been unable to make contact with the patient for follow up.    Burman Nieves, CCMA Care Coordination Care Guide Direct Dial: [phone]

## 2022-12-30 ENCOUNTER — Ambulatory Visit: Payer: Medicare Other | Admitting: Family Medicine

## 2022-12-30 ENCOUNTER — Encounter: Payer: Self-pay | Admitting: Gastroenterology

## 2022-12-30 DIAGNOSIS — R262 Difficulty in walking, not elsewhere classified: Secondary | ICD-10-CM | POA: Diagnosis not present

## 2022-12-30 DIAGNOSIS — Z96652 Presence of left artificial knee joint: Secondary | ICD-10-CM | POA: Diagnosis not present

## 2022-12-30 DIAGNOSIS — Z471 Aftercare following joint replacement surgery: Secondary | ICD-10-CM | POA: Diagnosis not present

## 2022-12-30 DIAGNOSIS — M6281 Muscle weakness (generalized): Secondary | ICD-10-CM | POA: Diagnosis not present

## 2022-12-30 DIAGNOSIS — M2560 Stiffness of unspecified joint, not elsewhere classified: Secondary | ICD-10-CM | POA: Diagnosis not present

## 2022-12-30 DIAGNOSIS — M1712 Unilateral primary osteoarthritis, left knee: Secondary | ICD-10-CM | POA: Diagnosis not present

## 2022-12-31 ENCOUNTER — Encounter: Payer: Self-pay | Admitting: Family Medicine

## 2022-12-31 ENCOUNTER — Ambulatory Visit (INDEPENDENT_AMBULATORY_CARE_PROVIDER_SITE_OTHER): Payer: Medicare Other | Admitting: Family Medicine

## 2022-12-31 ENCOUNTER — Other Ambulatory Visit (HOSPITAL_COMMUNITY)
Admission: RE | Admit: 2022-12-31 | Discharge: 2022-12-31 | Disposition: A | Discharge status: Home / Self Care | Payer: Medicare Other | Source: Ambulatory Visit | Attending: Family Medicine | Admitting: Family Medicine

## 2022-12-31 ENCOUNTER — Other Ambulatory Visit (HOSPITAL_BASED_OUTPATIENT_CLINIC_OR_DEPARTMENT_OTHER): Payer: Self-pay

## 2022-12-31 VITALS — BP 104/61 | HR 78 | Ht 65.0 in | Wt 157.0 lb

## 2022-12-31 DIAGNOSIS — M810 Age-related osteoporosis without current pathological fracture: Secondary | ICD-10-CM | POA: Diagnosis not present

## 2022-12-31 DIAGNOSIS — N898 Other specified noninflammatory disorders of vagina: Secondary | ICD-10-CM | POA: Insufficient documentation

## 2022-12-31 DIAGNOSIS — Z7984 Long term (current) use of oral hypoglycemic drugs: Secondary | ICD-10-CM | POA: Diagnosis not present

## 2022-12-31 DIAGNOSIS — Z0000 Encounter for general adult medical examination without abnormal findings: Secondary | ICD-10-CM

## 2022-12-31 DIAGNOSIS — Z0001 Encounter for general adult medical examination with abnormal findings: Secondary | ICD-10-CM | POA: Diagnosis not present

## 2022-12-31 DIAGNOSIS — R7989 Other specified abnormal findings of blood chemistry: Secondary | ICD-10-CM

## 2022-12-31 DIAGNOSIS — Z23 Encounter for immunization: Secondary | ICD-10-CM | POA: Diagnosis not present

## 2022-12-31 DIAGNOSIS — I10 Essential (primary) hypertension: Secondary | ICD-10-CM

## 2022-12-31 DIAGNOSIS — E782 Mixed hyperlipidemia: Secondary | ICD-10-CM | POA: Diagnosis not present

## 2022-12-31 DIAGNOSIS — E1140 Type 2 diabetes mellitus with diabetic neuropathy, unspecified: Secondary | ICD-10-CM | POA: Diagnosis not present

## 2022-12-31 MED ORDER — FLUCONAZOLE 150 MG PO TABS
150.0000 mg | ORAL_TABLET | Freq: Every day | ORAL | 0 refills | Status: DC
Start: 2022-12-31 — End: 2023-01-12
  Filled 2022-12-31: qty 2, 3d supply, fill #0

## 2022-12-31 NOTE — Progress Notes (Signed)
Complete physical exam  Patient: Victoria Walker   DOB: [birth year]   72 y.o. Female  MRN: [id number]  Subjective:    Chief Complaint  Patient presents with   Annual Exam    Victoria Walker is a 72 y.o. female who presents today for a complete physical exam. She reports consuming a general diet. Home exercise routine includes stationary bike, walking, physical therapy (recent L knee replacement). She generally feels well. She reports sleeping well. She does have additional problems to discuss today.   Currently lives with: husband Acute concerns or interim problems since last visit:  -She has had several days of vaginal itching. No discharge. No pain or bleeding.   Vision concerns: no Dental concerns: no   Patient denies ETOH use. Patient denies nicotine use. Patient denies illegal substance use.         Most recent fall risk assessment:    12/31/2022    1:27 PM  Fall Risk   Falls in the past year? 0  Number falls in past yr: 0  Injury with Fall? 0  Risk for fall due to : No Fall Risks  Follow up Falls evaluation completed     Most recent depression screenings:    12/31/2022    1:27 PM 12/03/2022   11:15 AM  PHQ 2/9 Scores  PHQ - 2 Score 0 0            Patient Care Team: Bradd Canary, MD as PCP - General (Family Medicine) Charna Elizabeth, MD as Consulting Physician (Gastroenterology) Seldomridge, Lauro Regulus, MD (Ophthalmology) Sheard, Joline Maxcy, DPM (Inactive) as Consulting Physician (Podiatry) Larence Penning, OD as Referring Physician (Optometry) Fletcher Anon, MD (Inactive) as Consulting Physician (Allergy and Immunology) Willodean Rosenthal, MD as Consulting Physician (Obstetrics and Gynecology) Salvatore Marvel, MD as Consulting Physician (Orthopedic Surgery) Mammography, [facility] (Diagnostic Radiology)   Outpatient Medications Prior to Visit  Medication Sig   alendronate (FOSAMAX) 70 MG tablet Take 1 tablet (70 mg total) by mouth once  a week. Take with a full glass of water on an empty stomach.   aspirin 81 MG EC tablet Take 81 mg by mouth every other day. Swallow whole.   atorvastatin (LIPITOR) 40 MG tablet TAKE 1 TABLET BY MOUTH EVERY OTHER DAY, ALTERNATING WITH 1/2 TABLET   AVAPRO 75 MG tablet Take 1 tablet (75 mg total) by mouth daily.   Calcium Carbonate-Vitamin D (OS-CAL 500 + D PO)    Calcium Polycarbophil (FIBER) 625 MG TABS Take 1 tablet by mouth daily.   Carboxymethylcellul-Glycerin (REFRESH OPTIVE OP) Apply to eye.   Cholecalciferol (VITAMIN D) 2000 UNITS CAPS Take 1 capsule by mouth every evening.    Ciclopirox 1 % shampoo Apply 1(one) application(s) topically 2(two) times a week   cycloSPORINE (RESTASIS) 0.05 % ophthalmic emulsion Place 1 drop into both eyes 2 (two) times daily.   diltiazem (TIADYLT ER) 360 MG 24 hr capsule Take 1 capsule (360 mg total) by mouth daily.   Estradiol 10 MCG TABS vaginal tablet Place 1 tablet (10 mcg total) vaginally every other day.   gabapentin (NEURONTIN) 300 MG capsule Take 1 capsule (300 mg total) by mouth at bedtime.   glucose blood (ONE TOUCH TEST STRIPS) test strip Use as directed twice daily to check blood sugar. DX code E11.9   levocetirizine (XYZAL) 5 MG tablet Take 1 tablet (5 mg total) by mouth every evening.   levothyroxine (SYNTHROID) 88 MCG tablet Take 1 tablet (88 mcg total) by  mouth daily. **SKIP SUNDAY**   loteprednol (LOTEMAX) 0.5 % ophthalmic suspension Place 1 drop into both eyes 4 (four) times daily as needed with flare up   metFORMIN (GLUCOPHAGE-XR) 750 MG 24 hr tablet Take 1 tablet (750 mg total) by mouth in the morning and at bedtime.   metoprolol succinate (TOPROL-XL) 25 MG 24 hr tablet Take 1 tablet (25 mg total) by mouth 2 (two) times daily.   mometasone (NASONEX) 50 MCG/ACT nasal spray Place 1-2 sprays in each nostril daily as needed for stuffy nose.   NEXIUM 40 MG capsule Take 1 capsule (40 mg total) by mouth daily.   Probiotic Product (PROBIOTIC DAILY  PO) Take 1 capsule by mouth daily.   spironolactone (ALDACTONE) 25 MG tablet Take 1.5 tablets (37.5 mg total) by mouth daily.   Vitamin D, Ergocalciferol, (DRISDOL) 1.25 MG (50000 UNIT) CAPS capsule Take 1 capsule (50,000 Units total) by mouth once a week for 6 doses.   [DISCONTINUED] cycloSPORINE (RESTASIS MULTIDOSE) 0.05 % ophthalmic emulsion Place 1 drop into both eyes 2 (two) times daily.   [DISCONTINUED] fluconazole (DIFLUCAN) 150 MG tablet Take 1 tablet (150 mg total) by mouth daily. May repeat dose in 3 days if symptoms do not improve   [DISCONTINUED] loteprednol (LOTEMAX) 0.5 % ophthalmic suspension Place one drop into both eyes four times daily with flare up.   [DISCONTINUED] ondansetron (ZOFRAN) 4 MG tablet Take 1 tablet (4 mg total) by mouth every 8 (eight) hours as needed for nausea for up to 7 days.   [DISCONTINUED] oxyCODONE (OXY IR/ROXICODONE) 5 MG immediate release tablet Take 1 tablet (5 mg total) by mouth every 4 (four) hours as needed for moderate pain (4-6) for up to 7 days.   [DISCONTINUED] RSV vaccine recomb adjuvanted (AREXVY) 120 MCG/0.5ML injection Inject into the muscle.   [DISCONTINUED] senna-docusate (SENOKOT-S) 8.6-50 MG tablet Take 2 tablets by mouth daily for 14 days.   [DISCONTINUED] traMADol (ULTRAM) 50 MG tablet Take 1 tablet (50 mg total) by mouth every 6 (six) hours as needed for moderate pain (4-6) for up to 7 days.   [DISCONTINUED] traMADol (ULTRAM) 50 MG tablet Take 1 tablet (50 mg total) by mouth every 6 (six) hours as needed moderate pain (4-6) for up to 7 days.   No facility-administered medications prior to visit.    ROS All review of systems negative except what is listed in the HPI        Objective:     BP 104/61   Pulse 78   Ht 5\' 5"  (1.651 m)   Wt 157 lb (71.2 kg)   SpO2 100%   BMI 26.13 kg/m    Physical Exam Vitals reviewed.  Constitutional:      General: She is not in acute distress.    Appearance: Normal appearance. She is not  ill-appearing.  HENT:     Head: Normocephalic and atraumatic.     Right Ear: Tympanic membrane normal.     Left Ear: Tympanic membrane normal.     Nose: Nose normal.     Mouth/Throat:     Mouth: Mucous membranes are moist.     Pharynx: Oropharynx is clear.  Eyes:     Extraocular Movements: Extraocular movements intact.     Conjunctiva/sclera: Conjunctivae normal.     Pupils: Pupils are equal, round, and reactive to light.  Neck:     Vascular: No carotid bruit.  Cardiovascular:     Rate and Rhythm: Normal rate and regular rhythm.  Pulses: Normal pulses.     Heart sounds: Normal heart sounds.  Pulmonary:     Effort: Pulmonary effort is normal.     Breath sounds: Normal breath sounds.  Abdominal:     General: Abdomen is flat. Bowel sounds are normal. There is no distension.     Palpations: Abdomen is soft. There is no mass.     Tenderness: There is no abdominal tenderness. There is no right CVA tenderness, left CVA tenderness, guarding or rebound.  Genitourinary:    Comments: Deferred exam Musculoskeletal:        General: Normal range of motion.     Cervical back: Normal range of motion and neck supple. No tenderness.     Right lower leg: No edema.     Left lower leg: No edema.  Lymphadenopathy:     Cervical: No cervical adenopathy.  Skin:    General: Skin is warm and dry.     Capillary Refill: Capillary refill takes less than 2 seconds.  Neurological:     General: No focal deficit present.     Mental Status: She is alert and oriented to person, place, and time. Mental status is at baseline.  Psychiatric:        Mood and Affect: Mood normal.        Behavior: Behavior normal.        Thought Content: Thought content normal.        Judgment: Judgment normal.          No results found for any visits on 12/31/22.     Assessment & Plan:    Routine Health Maintenance and Physical Exam Discussed health promotion and safety including diet and exercise  recommendations, dental health, and injury prevention. Tobacco cessation if applicable. Seat belts, sunscreen, smoke detectors, etc.    Immunization History  Administered Date(s) Administered   COVID-19, mRNA, vaccine(Comirnaty)12 years and older 02/24/2022   Fluad Quad(high Dose 65+) 12/17/2018, 01/21/2021, 01/09/2022   Fluad Trivalent(High Dose 65+) 12/31/2022   Influenza Whole 01/28/2017   Influenza, High Dose Seasonal PF 01/21/2016, 02/04/2017, 01/28/2018   Influenza,inj,Quad PF,6+ Mos 01/04/2013, 01/23/2014, 12/28/2014   PFIZER Comirnaty(Gray Top)Covid-19 Tri-Sucrose Vaccine 10/26/2020   PFIZER(Purple Top)SARS-COV-2 Vaccination 06/12/2019, 07/06/2019, 02/06/2020   Pfizer Covid-19 Vaccine Bivalent Booster 40yrs & up 02/05/2021   Pneumococcal Conjugate-13 12/28/2014   Pneumococcal Polysaccharide-23 05/12/2012, 10/27/2017   Respiratory Syncytial Virus Vaccine,Recomb Aduvanted(Arexvy) 05/06/2022   Tdap 05/02/2014   Zoster Recombinant(Shingrix) 01/15/2018, 03/25/2018    Health Maintenance  Topic Date Due   MAMMOGRAM  12/07/2022   FOOT EXAM  12/11/2022   Colonoscopy  02/14/2023   COVID-19 Vaccine (7 - 2023-24 season) 12/30/2023 (Originally 12/21/2022)   HEMOGLOBIN A1C  03/11/2023   Diabetic kidney evaluation - Urine ACR  05/02/2023   OPHTHALMOLOGY EXAM  08/22/2023   Diabetic kidney evaluation - eGFR measurement  09/08/2023   Medicare Annual Wellness (AWV)  12/03/2023   DTaP/Tdap/Td (2 - Td or Tdap) 05/02/2024   Pneumonia Vaccine 24+ Years old  Completed   INFLUENZA VACCINE  Completed   DEXA SCAN  Completed   Hepatitis C Screening  Completed   Zoster Vaccines- Shingrix  Completed   HPV VACCINES  Aged Out        Problem List Items Addressed This Visit       Active Problems   Type 2 diabetes mellitus with diabetic neuropathy, without long-term current use of insulin (HCC)    Previously controlled. Continue metformin and lifestyle measures.  Relevant Orders    Comprehensive metabolic panel   Hemoglobin A1c   Essential hypertension    Blood pressure is at goal for age and co-morbidities.   Recommendations: continue current regimen  - BP goal <130/80 - monitor and log blood pressures at home - check around the same time each day in a relaxed setting - Limit salt to <2000 mg/day - Follow DASH eating plan (heart healthy diet) - limit alcohol to 2 standard drinks per day for men and 1 per day for women - avoid tobacco products - get at least 2 hours of regular aerobic exercise weekly Patient aware of signs/symptoms requiring further/urgent evaluation. Labs updated today.       Relevant Orders   CBC with Differential/Platelet   Comprehensive metabolic panel   Lipid panel   Hyperlipidemia, mixed    Medication management: atorvastatin 40 mg daily Lifestyle factors for lowering cholesterol include: Diet therapy - heart-healthy diet rich in fruits, veggies, fiber-rich whole grains, lean meats, chicken, fish (at least twice a week), fat-free or 1% dairy products; foods low in saturated/trans fats, cholesterol, sodium, and sugar. Mediterranean diet has shown to be very heart healthy. Regular exercise - recommend at least 30 minutes a day, 5 times per week Weight management  Repeat CMP and lipid panel today       Relevant Orders   Comprehensive metabolic panel   Lipid panel   Osteoporosis    Continue supportive measures and supplements Recheck Vit D      Relevant Orders   VITAMIN D 25 Hydroxy (Vit-D Deficiency, Fractures)   Other Visit Diagnoses     Annual physical exam    -  Primary   Relevant Orders   CBC with Differential/Platelet   Comprehensive metabolic panel   Lipid panel   TSH   VITAMIN D 25 Hydroxy (Vit-D Deficiency, Fractures)   Hemoglobin A1c   Vaginal itching     Sending off swab. She is quite uncomfortable so we will go ahead and give Diflucan and change plan based on results if needed.     Relevant Medications    fluconazole (DIFLUCAN) 150 MG tablet   Other Relevant Orders   Cervicovaginal ancillary only   Flu vaccine need       Relevant Orders   Flu Vaccine Trivalent High Dose (Fluad) (Completed)      Return in about 6 months (around 06/30/2023) for routine follow-up.     Clayborne Dana, NP

## 2022-12-31 NOTE — Assessment & Plan Note (Signed)
Continue supportive measures and supplements Recheck Vit D

## 2022-12-31 NOTE — Assessment & Plan Note (Signed)
Previously controlled. Continue metformin and lifestyle measures.

## 2022-12-31 NOTE — Assessment & Plan Note (Signed)
Medication management: atorvastatin 40 mg daily Lifestyle factors for lowering cholesterol include: Diet therapy - heart-healthy diet rich in fruits, veggies, fiber-rich whole grains, lean meats, chicken, fish (at least twice a week), fat-free or 1% dairy products; foods low in saturated/trans fats, cholesterol, sodium, and sugar. Mediterranean diet has shown to be very heart healthy. Regular exercise - recommend at least 30 minutes a day, 5 times per week Weight management  Repeat CMP and lipid panel today

## 2022-12-31 NOTE — Assessment & Plan Note (Signed)
Blood pressure is at goal for age and co-morbidities.   Recommendations: continue current regimen - BP goal <130/80 - monitor and log blood pressures at home - check around the same time each day in a relaxed setting - Limit salt to <2000 mg/day - Follow DASH eating plan (heart healthy diet) - limit alcohol to 2 standard drinks per day for men and 1 per day for women - avoid tobacco products - get at least 2 hours of regular aerobic exercise weekly Patient aware of signs/symptoms requiring further/urgent evaluation. Labs updated today.  

## 2022-12-31 NOTE — Patient Instructions (Signed)
Preventive Care 65 Years and Older, Female Preventive care refers to lifestyle choices and visits with your health care provider that can promote health and wellness. Preventive care visits are also called wellness exams. What can I expect for my preventive care visit? Counseling Your health care provider may ask you questions about your: Medical history, including: Past medical problems. Family medical history. Pregnancy and menstrual history. History of falls. Current health, including: Memory and ability to understand (cognition). Emotional well-being. Home life and relationship well-being. Sexual activity and sexual health. Lifestyle, including: Alcohol, nicotine or tobacco, and drug use. Access to firearms. Diet, exercise, and sleep habits. Work and work environment. Sunscreen use. Safety issues such as seatbelt and bike helmet use. Physical exam Your health care provider will check your: Height and weight. These may be used to calculate your BMI (body mass index). BMI is a measurement that tells if you are at a healthy weight. Waist circumference. This measures the distance around your waistline. This measurement also tells if you are at a healthy weight and may help predict your risk of certain diseases, such as type 2 diabetes and high blood pressure. Heart rate and blood pressure. Body temperature. Skin for abnormal spots. What immunizations do I need?  Vaccines are usually given at various ages, according to a schedule. Your health care provider will recommend vaccines for you based on your age, medical history, and lifestyle or other factors, such as travel or where you work. What tests do I need? Screening Your health care provider may recommend screening tests for certain conditions. This may include: Lipid and cholesterol levels. Hepatitis C test. Hepatitis B test. HIV (human immunodeficiency virus) test. STI (sexually transmitted infection) testing, if you are at  risk. Lung cancer screening. Colorectal cancer screening. Diabetes screening. This is done by checking your blood sugar (glucose) after you have not eaten for a while (fasting). Mammogram. Talk with your health care provider about how often you should have regular mammograms. BRCA-related cancer screening. This may be done if you have a family history of breast, ovarian, tubal, or peritoneal cancers. Bone density scan. This is done to screen for osteoporosis. Talk with your health care provider about your test results, treatment options, and if necessary, the need for more tests. Follow these instructions at home: Eating and drinking  Eat a diet that includes fresh fruits and vegetables, whole grains, lean protein, and low-fat dairy products. Limit your intake of foods with high amounts of sugar, saturated fats, and salt. Take vitamin and mineral supplements as recommended by your health care provider. Do not drink alcohol if your health care provider tells you not to drink. If you drink alcohol: Limit how much you have to 0-1 drink a day. Know how much alcohol is in your drink. In the [place] one drink equals one 12 oz bottle of beer (355 mL), one 5 oz glass of wine (148 mL), or one 1 oz glass of hard liquor (44 mL). Lifestyle Brush your teeth every morning and night with fluoride toothpaste. Floss one time each day. Exercise for at least 30 minutes 5 or more days each week. Do not use any products that contain nicotine or tobacco. These products include cigarettes, chewing tobacco, and vaping devices, such as e-cigarettes. If you need help quitting, ask your health care provider. Do not use drugs. If you are sexually active, practice safe sex. Use a condom or other form of protection in order to prevent STIs. Take aspirin only as told by   your health care provider. Make sure that you understand how much to take and what form to take. Work with your health care provider to find out whether it  is safe and beneficial for you to take aspirin daily. Ask your health care provider if you need to take a cholesterol-lowering medicine (statin). Find healthy ways to manage stress, such as: Meditation, yoga, or listening to music. Journaling. Talking to a trusted person. Spending time with friends and family. Minimize exposure to UV radiation to reduce your risk of skin cancer. Safety Always wear your seat belt while driving or riding in a vehicle. Do not drive: If you have been drinking alcohol. Do not ride with someone who has been drinking. When you are tired or distracted. While texting. If you have been using any mind-altering substances or drugs. Wear a helmet and other protective equipment during sports activities. If you have firearms in your house, make sure you follow all gun safety procedures. What's next? Visit your health care provider once a year for an annual wellness visit. Ask your health care provider how often you should have your eyes and teeth checked. Stay up to date on all vaccines. This information is not intended to replace advice given to you by your health care provider. Make sure you discuss any questions you have with your health care provider. Document Revised: 10/03/2020 Document Reviewed: 10/03/2020 Elsevier Patient Education  2024 [employer]  

## 2023-01-01 ENCOUNTER — Telehealth: Payer: Self-pay | Admitting: *Deleted

## 2023-01-01 DIAGNOSIS — R262 Difficulty in walking, not elsewhere classified: Secondary | ICD-10-CM | POA: Diagnosis not present

## 2023-01-01 DIAGNOSIS — M6281 Muscle weakness (generalized): Secondary | ICD-10-CM | POA: Diagnosis not present

## 2023-01-01 DIAGNOSIS — M2560 Stiffness of unspecified joint, not elsewhere classified: Secondary | ICD-10-CM | POA: Diagnosis not present

## 2023-01-01 DIAGNOSIS — M1712 Unilateral primary osteoarthritis, left knee: Secondary | ICD-10-CM | POA: Diagnosis not present

## 2023-01-01 DIAGNOSIS — Z471 Aftercare following joint replacement surgery: Secondary | ICD-10-CM | POA: Diagnosis not present

## 2023-01-01 DIAGNOSIS — Z96652 Presence of left artificial knee joint: Secondary | ICD-10-CM | POA: Diagnosis not present

## 2023-01-01 LAB — CBC WITH DIFFERENTIAL/PLATELET
Basophils Absolute: 0 10*3/uL (ref 0.0–0.1)
Basophils Relative: 0.7 % (ref 0.0–3.0)
Eosinophils Absolute: 0.1 10*3/uL (ref 0.0–0.7)
Eosinophils Relative: 1.9 % (ref 0.0–5.0)
HCT: 37.4 % (ref 36.0–46.0)
Hemoglobin: 12.1 g/dL (ref 12.0–15.0)
Lymphocytes Relative: 41 % (ref 12.0–46.0)
Lymphs Abs: 2.6 10*3/uL (ref 0.7–4.0)
MCHC: 32.4 g/dL (ref 30.0–36.0)
MCV: 101.1 fl — ABNORMAL HIGH (ref 78.0–100.0)
Monocytes Absolute: 0.5 10*3/uL (ref 0.1–1.0)
Monocytes Relative: 8.6 % (ref 3.0–12.0)
Neutro Abs: 3 10*3/uL (ref 1.4–7.7)
Neutrophils Relative %: 47.8 % (ref 43.0–77.0)
Platelets: 339 10*3/uL (ref 150.0–400.0)
RBC: 3.7 Mil/uL — ABNORMAL LOW (ref 3.87–5.11)
RDW: 15 % (ref 11.5–15.5)
WBC: 6.3 10*3/uL (ref 4.0–10.5)

## 2023-01-01 LAB — LIPID PANEL
Cholesterol: 136 mg/dL (ref 0–200)
HDL: 42.8 mg/dL (ref >39.00)
LDL Cholesterol: 55 mg/dL (ref 0–99)
NonHDL: 92.96
Total CHOL/HDL Ratio: 3
Triglycerides: 188 mg/dL — ABNORMAL HIGH (ref 0.0–149.0)
VLDL: 37.6 mg/dL (ref 0.0–40.0)

## 2023-01-01 LAB — HEMOGLOBIN A1C: Hgb A1c MFr Bld: 6.1 % (ref 4.6–6.5)

## 2023-01-01 LAB — CERVICOVAGINAL ANCILLARY ONLY
Bacterial Vaginitis (gardnerella): NEGATIVE
Candida Glabrata: NEGATIVE
Candida Vaginitis: NEGATIVE
Comment: NEGATIVE
Comment: NEGATIVE
Comment: NEGATIVE

## 2023-01-01 LAB — COMPREHENSIVE METABOLIC PANEL
ALT: 9 U/L (ref 0–35)
AST: 14 U/L (ref 0–37)
Albumin: 4.3 g/dL (ref 3.5–5.2)
Alkaline Phosphatase: 53 U/L (ref 39–117)
BUN: 15 mg/dL (ref 6–23)
CO2: 26 meq/L (ref 19–32)
Calcium: 9.5 mg/dL (ref 8.4–10.5)
Chloride: 103 meq/L (ref 96–112)
Creatinine, Ser: 0.93 mg/dL (ref 0.40–1.20)
GFR: 61.15 mL/min (ref >60.00)
Glucose, Bld: 75 mg/dL (ref 70–99)
Potassium: 4.7 meq/L (ref 3.5–5.1)
Sodium: 140 meq/L (ref 135–145)
Total Bilirubin: 0.4 mg/dL (ref 0.2–1.2)
Total Protein: 6.6 g/dL (ref 6.0–8.3)

## 2023-01-01 LAB — VITAMIN D 25 HYDROXY (VIT D DEFICIENCY, FRACTURES): VITD: 98.4 ng/mL (ref 30.00–100.00)

## 2023-01-01 LAB — TSH: TSH: 0.41 u[IU]/mL (ref 0.35–5.50)

## 2023-01-01 NOTE — Addendum Note (Signed)
Addended by: Hyman Hopes B on: 01/01/2023 02:37 PM   Modules accepted: Orders

## 2023-01-01 NOTE — Progress Notes (Signed)
  Care Coordination Note  01/01/2023 Name: Victoria Walker MRN: [id number] DOB: [birth year]  Victoria Walker is a 72 y.o. year old female who is a primary care patient of Bradd Canary, MD and is actively engaged with the care management team. I reached out to Victoria Walker by phone today to assist with re-scheduling an initial visit with the RN Case Manager  Follow up plan: Telephone appointment with care management team member scheduled for: 01/26/2023  Burman Nieves, [facility] Care Coordination Care Guide Direct Dial: [phone]

## 2023-01-02 ENCOUNTER — Ambulatory Visit: Payer: Medicare Other

## 2023-01-02 ENCOUNTER — Other Ambulatory Visit (INDEPENDENT_AMBULATORY_CARE_PROVIDER_SITE_OTHER): Payer: Medicare Other

## 2023-01-02 DIAGNOSIS — R7989 Other specified abnormal findings of blood chemistry: Secondary | ICD-10-CM | POA: Diagnosis not present

## 2023-01-02 DIAGNOSIS — E538 Deficiency of other specified B group vitamins: Secondary | ICD-10-CM

## 2023-01-02 LAB — B12 AND FOLATE PANEL
Folate: 7.3 ng/mL (ref >5.9)
Vitamin B-12: 94 pg/mL — ABNORMAL LOW (ref 211–911)

## 2023-01-05 ENCOUNTER — Telehealth: Payer: Self-pay | Admitting: Family Medicine

## 2023-01-05 NOTE — Telephone Encounter (Signed)
Yes, she can do injections. Weekly for 4 weeks then monthly. Repeat CBC and B12 in 8 weeks.

## 2023-01-05 NOTE — Telephone Encounter (Signed)
Pt said she saw note from Hyman Hopes but she wants to know if the injections would be faster. Pt takes a lot of pills and would rather do the injection and come back in a month unless provider has a specific reason for her trying the pill first. Is her levels low enough for the injection? Please call to advise.

## 2023-01-05 NOTE — Telephone Encounter (Signed)
Lab Results  Component Value Date   VITAMINB12 94 (L) 01/02/2023    B12 is low. Adding on another lab to further evaluate. Go ahead and start taking 2,000 mcg of B12 over-the-counter daily. We will recheck in about 4 weeks. If not making good progress we will likely need to switch to injections.    Please advise.

## 2023-01-06 DIAGNOSIS — R262 Difficulty in walking, not elsewhere classified: Secondary | ICD-10-CM | POA: Diagnosis not present

## 2023-01-06 DIAGNOSIS — Z471 Aftercare following joint replacement surgery: Secondary | ICD-10-CM | POA: Diagnosis not present

## 2023-01-06 DIAGNOSIS — M6281 Muscle weakness (generalized): Secondary | ICD-10-CM | POA: Diagnosis not present

## 2023-01-06 DIAGNOSIS — M2560 Stiffness of unspecified joint, not elsewhere classified: Secondary | ICD-10-CM | POA: Diagnosis not present

## 2023-01-06 DIAGNOSIS — Z96652 Presence of left artificial knee joint: Secondary | ICD-10-CM | POA: Diagnosis not present

## 2023-01-06 DIAGNOSIS — M1712 Unilateral primary osteoarthritis, left knee: Secondary | ICD-10-CM | POA: Diagnosis not present

## 2023-01-06 LAB — INTRINSIC FACTOR ANTIBODIES: Intrinsic Factor: NEGATIVE

## 2023-01-06 NOTE — Telephone Encounter (Signed)
Spoke with patient and relayed information. Scheduled for weekly injections for 4 weeks.

## 2023-01-06 NOTE — Telephone Encounter (Signed)
LVM to call back to discuss.

## 2023-01-06 NOTE — Telephone Encounter (Signed)
Patient called back when I was unavailable, did try to call again with no answer.

## 2023-01-08 DIAGNOSIS — M1712 Unilateral primary osteoarthritis, left knee: Secondary | ICD-10-CM | POA: Diagnosis not present

## 2023-01-08 DIAGNOSIS — M6281 Muscle weakness (generalized): Secondary | ICD-10-CM | POA: Diagnosis not present

## 2023-01-08 DIAGNOSIS — Z471 Aftercare following joint replacement surgery: Secondary | ICD-10-CM | POA: Diagnosis not present

## 2023-01-08 DIAGNOSIS — M2560 Stiffness of unspecified joint, not elsewhere classified: Secondary | ICD-10-CM | POA: Diagnosis not present

## 2023-01-08 DIAGNOSIS — Z96652 Presence of left artificial knee joint: Secondary | ICD-10-CM | POA: Diagnosis not present

## 2023-01-08 DIAGNOSIS — R262 Difficulty in walking, not elsewhere classified: Secondary | ICD-10-CM | POA: Diagnosis not present

## 2023-01-08 NOTE — Progress Notes (Signed)
Victoria Walker is a 72 y.o. female presents to the office today for 1/4 weekly B12 injection, per physician's orders. Original order: 01/05/23- Hyman Hopes: "Yes, she can do injections. Weekly for 4 weeks then monthly. Repeat CBC and B12 in 8 weeks." Cyanocobalamin 1000 mg/ml IM was administered L deltoid today. Patient tolerated injection. Patient due for follow up labs/provider appt: No.  Patient next injection due: 1 week for 2/4 weekly B12, appt made Yes (next 3 apts are pending).  Creft, Feliberto Harts

## 2023-01-09 ENCOUNTER — Ambulatory Visit (INDEPENDENT_AMBULATORY_CARE_PROVIDER_SITE_OTHER): Payer: Medicare Other

## 2023-01-09 DIAGNOSIS — E538 Deficiency of other specified B group vitamins: Secondary | ICD-10-CM

## 2023-01-09 MED ORDER — CYANOCOBALAMIN 1000 MCG/ML IJ SOLN
1000.0000 ug | Freq: Once | INTRAMUSCULAR | Status: AC
Start: 2023-01-09 — End: 2023-01-09
  Administered 2023-01-09: 1000 ug via INTRAMUSCULAR

## 2023-01-12 ENCOUNTER — Other Ambulatory Visit (HOSPITAL_BASED_OUTPATIENT_CLINIC_OR_DEPARTMENT_OTHER): Payer: Self-pay

## 2023-01-12 MED ORDER — FLUCONAZOLE 150 MG PO TABS
150.0000 mg | ORAL_TABLET | Freq: Once | ORAL | 0 refills | Status: AC
  Filled 2023-01-12: qty 1, 1d supply, fill #0

## 2023-01-13 ENCOUNTER — Other Ambulatory Visit (HOSPITAL_BASED_OUTPATIENT_CLINIC_OR_DEPARTMENT_OTHER): Payer: Self-pay

## 2023-01-13 DIAGNOSIS — M2560 Stiffness of unspecified joint, not elsewhere classified: Secondary | ICD-10-CM | POA: Diagnosis not present

## 2023-01-13 DIAGNOSIS — M1712 Unilateral primary osteoarthritis, left knee: Secondary | ICD-10-CM | POA: Diagnosis not present

## 2023-01-13 DIAGNOSIS — M6281 Muscle weakness (generalized): Secondary | ICD-10-CM | POA: Diagnosis not present

## 2023-01-13 DIAGNOSIS — Z471 Aftercare following joint replacement surgery: Secondary | ICD-10-CM | POA: Diagnosis not present

## 2023-01-13 DIAGNOSIS — Z96652 Presence of left artificial knee joint: Secondary | ICD-10-CM | POA: Diagnosis not present

## 2023-01-13 DIAGNOSIS — R262 Difficulty in walking, not elsewhere classified: Secondary | ICD-10-CM | POA: Diagnosis not present

## 2023-01-14 ENCOUNTER — Other Ambulatory Visit (HOSPITAL_BASED_OUTPATIENT_CLINIC_OR_DEPARTMENT_OTHER): Payer: Self-pay

## 2023-01-14 DIAGNOSIS — Z1231 Encounter for screening mammogram for malignant neoplasm of breast: Secondary | ICD-10-CM | POA: Diagnosis not present

## 2023-01-14 LAB — HM MAMMOGRAPHY

## 2023-01-15 DIAGNOSIS — Z96652 Presence of left artificial knee joint: Secondary | ICD-10-CM | POA: Diagnosis not present

## 2023-01-15 DIAGNOSIS — R262 Difficulty in walking, not elsewhere classified: Secondary | ICD-10-CM | POA: Diagnosis not present

## 2023-01-15 DIAGNOSIS — M1712 Unilateral primary osteoarthritis, left knee: Secondary | ICD-10-CM | POA: Diagnosis not present

## 2023-01-15 DIAGNOSIS — M6281 Muscle weakness (generalized): Secondary | ICD-10-CM | POA: Diagnosis not present

## 2023-01-15 DIAGNOSIS — M2560 Stiffness of unspecified joint, not elsewhere classified: Secondary | ICD-10-CM | POA: Diagnosis not present

## 2023-01-15 DIAGNOSIS — Z471 Aftercare following joint replacement surgery: Secondary | ICD-10-CM | POA: Diagnosis not present

## 2023-01-16 LAB — HM DIABETES EYE EXAM

## 2023-01-19 ENCOUNTER — Encounter: Payer: Self-pay | Admitting: Family Medicine

## 2023-01-19 ENCOUNTER — Ambulatory Visit (INDEPENDENT_AMBULATORY_CARE_PROVIDER_SITE_OTHER): Payer: Medicare Other

## 2023-01-19 DIAGNOSIS — E538 Deficiency of other specified B group vitamins: Secondary | ICD-10-CM | POA: Diagnosis not present

## 2023-01-19 MED ORDER — CYANOCOBALAMIN 1000 MCG/ML IJ SOLN
1000.0000 ug | Freq: Once | INTRAMUSCULAR | Status: AC
Start: 2023-01-19 — End: 2023-01-19
  Administered 2023-01-19: 1000 ug via INTRAMUSCULAR

## 2023-01-19 NOTE — Progress Notes (Signed)
Victoria Walker is a 72 y.o. female presents to the office today for 2/4 weekly B12 injection, per physician's orders. Original order: 01/05/23- Hyman Hopes: "Yes, she can do injections. Weekly for 4 weeks then monthly. Repeat CBC and B12 in 8 weeks." Denies gastrointestinal problems or dizziness.   B12 injection to left deltoid with no apparent complications.  Patient is scheduled for her next injection in one week for 3/4 B12 injection.

## 2023-01-20 ENCOUNTER — Encounter: Payer: Self-pay | Admitting: Podiatry

## 2023-01-20 ENCOUNTER — Encounter: Payer: Self-pay | Admitting: Family Medicine

## 2023-01-20 ENCOUNTER — Ambulatory Visit (INDEPENDENT_AMBULATORY_CARE_PROVIDER_SITE_OTHER): Payer: Medicare Other | Admitting: Podiatry

## 2023-01-20 DIAGNOSIS — R262 Difficulty in walking, not elsewhere classified: Secondary | ICD-10-CM | POA: Diagnosis not present

## 2023-01-20 DIAGNOSIS — E1140 Type 2 diabetes mellitus with diabetic neuropathy, unspecified: Secondary | ICD-10-CM

## 2023-01-20 DIAGNOSIS — M2141 Flat foot [pes planus] (acquired), right foot: Secondary | ICD-10-CM

## 2023-01-20 DIAGNOSIS — M6281 Muscle weakness (generalized): Secondary | ICD-10-CM | POA: Diagnosis not present

## 2023-01-20 DIAGNOSIS — Z471 Aftercare following joint replacement surgery: Secondary | ICD-10-CM | POA: Diagnosis not present

## 2023-01-20 DIAGNOSIS — E119 Type 2 diabetes mellitus without complications: Secondary | ICD-10-CM | POA: Diagnosis not present

## 2023-01-20 DIAGNOSIS — M2142 Flat foot [pes planus] (acquired), left foot: Secondary | ICD-10-CM

## 2023-01-20 DIAGNOSIS — M2560 Stiffness of unspecified joint, not elsewhere classified: Secondary | ICD-10-CM | POA: Diagnosis not present

## 2023-01-20 DIAGNOSIS — L84 Corns and callosities: Secondary | ICD-10-CM

## 2023-01-20 DIAGNOSIS — M1712 Unilateral primary osteoarthritis, left knee: Secondary | ICD-10-CM | POA: Diagnosis not present

## 2023-01-20 DIAGNOSIS — Z96652 Presence of left artificial knee joint: Secondary | ICD-10-CM | POA: Diagnosis not present

## 2023-01-20 NOTE — Progress Notes (Unsigned)
Patient gave me old orthotics to send to [facility] for refurbish  Will be adding heel pads as well  Patient will owe $90 when she picks up  May also set appt for DM shoe eval  Financial signed for re-furbish  Addison Bailey Cped, CFo, CFm

## 2023-01-21 ENCOUNTER — Ambulatory Visit: Payer: Medicare Other

## 2023-01-22 DIAGNOSIS — M6281 Muscle weakness (generalized): Secondary | ICD-10-CM | POA: Diagnosis not present

## 2023-01-22 DIAGNOSIS — Z96652 Presence of left artificial knee joint: Secondary | ICD-10-CM | POA: Diagnosis not present

## 2023-01-22 DIAGNOSIS — N1831 Chronic kidney disease, stage 3a: Secondary | ICD-10-CM | POA: Diagnosis not present

## 2023-01-22 DIAGNOSIS — M2560 Stiffness of unspecified joint, not elsewhere classified: Secondary | ICD-10-CM | POA: Diagnosis not present

## 2023-01-22 DIAGNOSIS — I129 Hypertensive chronic kidney disease with stage 1 through stage 4 chronic kidney disease, or unspecified chronic kidney disease: Secondary | ICD-10-CM | POA: Diagnosis not present

## 2023-01-22 DIAGNOSIS — M654 Radial styloid tenosynovitis [de Quervain]: Secondary | ICD-10-CM | POA: Diagnosis not present

## 2023-01-22 DIAGNOSIS — Z471 Aftercare following joint replacement surgery: Secondary | ICD-10-CM | POA: Diagnosis not present

## 2023-01-22 DIAGNOSIS — M1712 Unilateral primary osteoarthritis, left knee: Secondary | ICD-10-CM | POA: Diagnosis not present

## 2023-01-22 DIAGNOSIS — E1122 Type 2 diabetes mellitus with diabetic chronic kidney disease: Secondary | ICD-10-CM | POA: Diagnosis not present

## 2023-01-22 DIAGNOSIS — R262 Difficulty in walking, not elsewhere classified: Secondary | ICD-10-CM | POA: Diagnosis not present

## 2023-01-22 NOTE — Progress Notes (Signed)
Subjective:  Patient ID: Victoria Walker, female    DOB: [birth year],  MRN: [id number]  Chief Complaint  Patient presents with   Diabetes    Annual DM foot exam    72 y.o. female returns with the above complaint. History confirmed with patient.  Overall doing well. Overall doing well no new concerns, she feels like her orthotics are digging into her feet  Objective:  Physical Exam: warm, good capillary refill, no trophic changes or ulcerative lesions, normal DP and PT pulses and somewhat reduced monofilament exam, she is inconsistent in the usual pulps of the toes.  Skin normal turgor and texture no notable lesions    Assessment:   1. Encounter for comprehensive diabetic foot examination, type 2 diabetes mellitus (HCC)   2. Type 2 diabetes mellitus with diabetic neuropathy, without long-term current use of insulin (HCC)   3. Pes planus of both feet   4. Callus of foot         Plan:  Patient was evaluated and treated and all questions answered.  Patient educated on diabetes. Discussed proper diabetic foot care and discussed risks and complications of disease. Educated patient in depth on reasons to return to the office immediately should he/she discover anything concerning or new on the feet. All questions answered. Discussed proper shoes as well.   I inspected her orthotics, they could benefit from refurbishment in addition of the heel pad she met with our orthotist today to discuss these will be refurbished.  She will also be scheduled for diabetic shoe fitting.  Return in about 1 year (around 01/20/2024) for annual diabetic foot exam.   Sharl Ma, DPM 01/22/2023

## 2023-01-23 ENCOUNTER — Telehealth: Payer: Self-pay | Admitting: Family Medicine

## 2023-01-23 LAB — LAB REPORT - SCANNED: Creatinine, POC: 43 mg/dL

## 2023-01-23 NOTE — Telephone Encounter (Signed)
Pt had to reschedule appt for today at 1pm. She said November would be best for her.

## 2023-01-26 ENCOUNTER — Encounter: Payer: Medicare Other | Admitting: Licensed Clinical Social Worker

## 2023-01-26 DIAGNOSIS — M2560 Stiffness of unspecified joint, not elsewhere classified: Secondary | ICD-10-CM | POA: Diagnosis not present

## 2023-01-26 DIAGNOSIS — M6281 Muscle weakness (generalized): Secondary | ICD-10-CM | POA: Diagnosis not present

## 2023-01-26 DIAGNOSIS — R262 Difficulty in walking, not elsewhere classified: Secondary | ICD-10-CM | POA: Diagnosis not present

## 2023-01-26 DIAGNOSIS — M1712 Unilateral primary osteoarthritis, left knee: Secondary | ICD-10-CM | POA: Diagnosis not present

## 2023-01-26 DIAGNOSIS — Z96652 Presence of left artificial knee joint: Secondary | ICD-10-CM | POA: Diagnosis not present

## 2023-01-26 DIAGNOSIS — Z471 Aftercare following joint replacement surgery: Secondary | ICD-10-CM | POA: Diagnosis not present

## 2023-01-27 NOTE — Progress Notes (Signed)
Scheduled follow up with RN for 02/25/2023

## 2023-01-30 ENCOUNTER — Ambulatory Visit: Payer: Medicare Other

## 2023-01-30 DIAGNOSIS — E538 Deficiency of other specified B group vitamins: Secondary | ICD-10-CM

## 2023-01-30 MED ORDER — CYANOCOBALAMIN 1000 MCG/ML IJ SOLN
1000.0000 ug | Freq: Once | INTRAMUSCULAR | Status: AC
Start: 2023-01-30 — End: 2023-01-30
  Administered 2023-01-30: 1000 ug via INTRAMUSCULAR

## 2023-01-30 NOTE — Progress Notes (Signed)
Pt here for weekly 3 of 4 B12 injection per Dr Abner Greenspan  B12 given L deltoid IM, and pt tolerated injection well.  Next B12 injection scheduled for 1 week

## 2023-02-02 DIAGNOSIS — Z96652 Presence of left artificial knee joint: Secondary | ICD-10-CM | POA: Diagnosis not present

## 2023-02-02 DIAGNOSIS — R262 Difficulty in walking, not elsewhere classified: Secondary | ICD-10-CM | POA: Diagnosis not present

## 2023-02-02 DIAGNOSIS — Z471 Aftercare following joint replacement surgery: Secondary | ICD-10-CM | POA: Diagnosis not present

## 2023-02-02 DIAGNOSIS — M6281 Muscle weakness (generalized): Secondary | ICD-10-CM | POA: Diagnosis not present

## 2023-02-02 DIAGNOSIS — M2560 Stiffness of unspecified joint, not elsewhere classified: Secondary | ICD-10-CM | POA: Diagnosis not present

## 2023-02-02 DIAGNOSIS — M1712 Unilateral primary osteoarthritis, left knee: Secondary | ICD-10-CM | POA: Diagnosis not present

## 2023-02-03 ENCOUNTER — Other Ambulatory Visit (HOSPITAL_BASED_OUTPATIENT_CLINIC_OR_DEPARTMENT_OTHER): Payer: Self-pay

## 2023-02-03 ENCOUNTER — Other Ambulatory Visit: Payer: Self-pay | Admitting: Family Medicine

## 2023-02-03 MED ORDER — DILTIAZEM HCL ER BEADS 360 MG PO CP24
360.0000 mg | ORAL_CAPSULE | Freq: Every day | ORAL | 1 refills | Status: DC
  Filled 2023-02-03: qty 90, 90d supply, fill #0
  Filled 2023-04-26: qty 90, 90d supply, fill #1

## 2023-02-04 ENCOUNTER — Telehealth: Payer: Self-pay | Admitting: Family Medicine

## 2023-02-04 NOTE — Telephone Encounter (Signed)
Patient called to see if provider could order a urine specimen for her so she can get it down while she is here on Friday for her b12 shot. Pt said she has frequent urination and burning and a little itch and wants to see whether it's UTI or yeast infection. Please call her to see if she is able to get this done while she is here 10/18

## 2023-02-05 ENCOUNTER — Other Ambulatory Visit: Payer: Self-pay

## 2023-02-05 ENCOUNTER — Other Ambulatory Visit (HOSPITAL_BASED_OUTPATIENT_CLINIC_OR_DEPARTMENT_OTHER): Payer: Self-pay

## 2023-02-05 DIAGNOSIS — R350 Frequency of micturition: Secondary | ICD-10-CM

## 2023-02-05 NOTE — Telephone Encounter (Signed)
Called pt lvm letting her we could do the UA and culture on tomorrow  for frequent urination and burning. Labs order placed and lab appt scheduled before nurse visit.

## 2023-02-06 ENCOUNTER — Other Ambulatory Visit (INDEPENDENT_AMBULATORY_CARE_PROVIDER_SITE_OTHER): Payer: Medicare Other

## 2023-02-06 ENCOUNTER — Telehealth: Payer: Self-pay

## 2023-02-06 ENCOUNTER — Ambulatory Visit (INDEPENDENT_AMBULATORY_CARE_PROVIDER_SITE_OTHER): Payer: Medicare Other

## 2023-02-06 DIAGNOSIS — E538 Deficiency of other specified B group vitamins: Secondary | ICD-10-CM

## 2023-02-06 DIAGNOSIS — R350 Frequency of micturition: Secondary | ICD-10-CM | POA: Diagnosis not present

## 2023-02-06 LAB — POC URINALSYSI DIPSTICK (AUTOMATED)
Bilirubin, UA: NEGATIVE
Blood, UA: NEGATIVE
Glucose, UA: NEGATIVE
Ketones, UA: NEGATIVE
Leukocytes, UA: NEGATIVE
Nitrite, UA: NEGATIVE
Protein, UA: NEGATIVE
Spec Grav, UA: 1.015 (ref 1.010–1.025)
Urobilinogen, UA: 0.2 U/dL
pH, UA: 6 (ref 5.0–8.0)

## 2023-02-06 MED ORDER — CYANOCOBALAMIN 1000 MCG/ML IJ SOLN
1000.0000 ug | Freq: Once | INTRAMUSCULAR | Status: AC
Start: 2023-02-06 — End: 2023-02-06
  Administered 2023-02-06: 1000 ug via INTRAMUSCULAR

## 2023-02-06 NOTE — Telephone Encounter (Signed)
Pt called and lvm to return call in regards to scheduling monthly b12

## 2023-02-06 NOTE — Telephone Encounter (Signed)
Pt here today for 4/4 weekly b12 injections , pt wants to know if she needs to continue with monthly injections. Advised pt b12 levels  needed to be tested again but she stated she thought she needed monthly injections.

## 2023-02-06 NOTE — Telephone Encounter (Signed)
Unable to add formal u/a. Notified pt of below and she voices understanding. Pt asked if we checked for yeast. Advised pt that is done via swab and may require an additional visit. Will await culture results and proceed from there. Pt is agreeable.

## 2023-02-06 NOTE — Telephone Encounter (Signed)
How many monthly?

## 2023-02-06 NOTE — Telephone Encounter (Signed)
Dr Drue Novel-- can you advise in PCP absence?  Pt came to lab for urine specimen. POC dip completed and urine sent for culture.  Pt stated while she was here that she was feeling very uncomfortable from the frequent urination and burning and was wanting to know if anything would be called in for her prior to the culture results coming back.  Please see POC result and advise what pt can do?

## 2023-02-06 NOTE — Telephone Encounter (Signed)
From phone message 01/05/2023:  Clayborne Dana, NP  to Me     01/05/23  4:16 PM Note Yes, she can do injections. Weekly for 4 weeks then monthly. Repeat CBC and B12 in 8 weeks.

## 2023-02-06 NOTE — Telephone Encounter (Signed)
The Udip is completely normal.  Can you add a formal UA?Marland Kitchen Otherwise encouraged to push fluids , take OTCs such as Azo Standard

## 2023-02-06 NOTE — Telephone Encounter (Signed)
We need to plan on continuing monthly injections for now. We can recheck CBC and B12 in 2 months to see if we are making progress.

## 2023-02-06 NOTE — Progress Notes (Signed)
Pt here for weekly B12 injection per Hyman Hopes, NP  B12 given IM, and pt tolerated injection well.

## 2023-02-06 NOTE — Telephone Encounter (Signed)
Pt called to discuss with nurse about her b12 shot. Please call and advise.

## 2023-02-07 LAB — URINE CULTURE
MICRO NUMBER:: 15613572
SPECIMEN QUALITY:: ADEQUATE

## 2023-02-09 NOTE — Telephone Encounter (Signed)
Patient is scheduled for B12 injection in November.

## 2023-02-10 ENCOUNTER — Other Ambulatory Visit: Payer: Self-pay | Admitting: Family Medicine

## 2023-02-10 ENCOUNTER — Ambulatory Visit (AMBULATORY_SURGERY_CENTER): Payer: Medicare Other

## 2023-02-10 ENCOUNTER — Other Ambulatory Visit (HOSPITAL_BASED_OUTPATIENT_CLINIC_OR_DEPARTMENT_OTHER): Payer: Self-pay

## 2023-02-10 VITALS — Ht 64.0 in | Wt 160.0 lb

## 2023-02-10 DIAGNOSIS — Z1211 Encounter for screening for malignant neoplasm of colon: Secondary | ICD-10-CM

## 2023-02-10 MED ORDER — NA SULFATE-K SULFATE-MG SULF 17.5-3.13-1.6 GM/177ML PO SOLN
1.0000 | Freq: Once | ORAL | 0 refills | Status: AC
Start: 2023-02-10 — End: 2023-02-14
  Filled 2023-02-10: qty 354, 1d supply, fill #0

## 2023-02-10 NOTE — Progress Notes (Signed)

## 2023-02-11 ENCOUNTER — Other Ambulatory Visit (HOSPITAL_BASED_OUTPATIENT_CLINIC_OR_DEPARTMENT_OTHER): Payer: Self-pay

## 2023-02-11 MED ORDER — AVAPRO 75 MG PO TABS
75.0000 mg | ORAL_TABLET | Freq: Every day | ORAL | 1 refills | Status: AC
Start: 2023-02-11 — End: ?
  Filled 2023-02-11 – 2023-02-13 (×2): qty 90, 90d supply, fill #0
  Filled 2023-05-18: qty 90, 30d supply, fill #1

## 2023-02-11 MED ORDER — SPIRONOLACTONE 25 MG PO TABS
37.5000 mg | ORAL_TABLET | Freq: Every day | ORAL | 1 refills | Status: DC
  Filled 2023-02-11 – 2023-02-13 (×2): qty 135, 90d supply, fill #0
  Filled 2023-05-18: qty 135, 90d supply, fill #1

## 2023-02-11 MED ORDER — METOPROLOL SUCCINATE ER 25 MG PO TB24
25.0000 mg | ORAL_TABLET | Freq: Two times a day (BID) | ORAL | 1 refills | Status: DC
  Filled 2023-02-11 – 2023-02-13 (×2): qty 180, 90d supply, fill #0
  Filled 2023-05-18: qty 180, 90d supply, fill #1

## 2023-02-12 ENCOUNTER — Other Ambulatory Visit (HOSPITAL_BASED_OUTPATIENT_CLINIC_OR_DEPARTMENT_OTHER): Payer: Self-pay

## 2023-02-12 DIAGNOSIS — Z96652 Presence of left artificial knee joint: Secondary | ICD-10-CM | POA: Diagnosis not present

## 2023-02-12 MED ORDER — CLINDAMYCIN HCL 150 MG PO CAPS
450.0000 mg | ORAL_CAPSULE | ORAL | 0 refills | Status: DC
  Filled 2023-02-12: qty 3, 1d supply, fill #0

## 2023-02-12 MED ORDER — FLUCONAZOLE 150 MG PO TABS
150.0000 mg | ORAL_TABLET | Freq: Once | ORAL | 0 refills | Status: AC
  Filled 2023-02-12: qty 1, 1d supply, fill #0

## 2023-02-13 ENCOUNTER — Other Ambulatory Visit (HOSPITAL_BASED_OUTPATIENT_CLINIC_OR_DEPARTMENT_OTHER): Payer: Self-pay

## 2023-02-16 ENCOUNTER — Ambulatory Visit
Admission: RE | Admit: 2023-02-16 | Discharge: 2023-02-16 | Disposition: A | Discharge status: Home / Self Care | Payer: Medicare Other | Source: Ambulatory Visit | Attending: Internal Medicine | Admitting: Internal Medicine

## 2023-02-16 ENCOUNTER — Other Ambulatory Visit (HOSPITAL_BASED_OUTPATIENT_CLINIC_OR_DEPARTMENT_OTHER): Payer: Self-pay

## 2023-02-16 ENCOUNTER — Other Ambulatory Visit: Payer: Self-pay

## 2023-02-16 VITALS — BP 111/76 | HR 74 | Temp 97.7°F | Resp 17

## 2023-02-16 DIAGNOSIS — R300 Dysuria: Secondary | ICD-10-CM | POA: Diagnosis not present

## 2023-02-16 LAB — POCT URINALYSIS DIP (MANUAL ENTRY)
Bilirubin, UA: NEGATIVE
Blood, UA: NEGATIVE
Glucose, UA: NEGATIVE mg/dL
Ketones, POC UA: NEGATIVE mg/dL
Leukocytes, UA: NEGATIVE
Nitrite, UA: NEGATIVE
Protein Ur, POC: NEGATIVE mg/dL
Spec Grav, UA: 1.015 (ref 1.010–1.025)
Urobilinogen, UA: 0.2 U/dL
pH, UA: 6 (ref 5.0–8.0)

## 2023-02-16 NOTE — ED Provider Notes (Signed)
[facility]    CSN: [id number] Arrival date & time: 02/16/23  1229      History   Chief Complaint Chief Complaint  Patient presents with   Appointment    HPI Victoria Walker is a 72 y.o. female presents for dysuria.  Patient reports 4 days of urinary burning, urgency, frequency, and suprapubic pressure with occasional incontinence.  Denies any fevers, nausea/vomiting, flank pain.  No vaginal discharge or STD concern but does endorse some burning.  States she was seen by her PCP couple weeks ago for same complaints and had a normal UA and normal urine culture.  She has not used any OTC medications for symptoms.  No other concerns at this time.  HPI  Past Medical History:  Diagnosis Date   Arthritis    "both knees" (07/02/2015)   Cancer of right breast (HCC)    Claustrophobia    Dysrhythmia    HX OF FAST HEART RATE AND PALPITATIONS - METOPROLOL HAS HELPED   GERD (gastroesophageal reflux disease)    H/O hiatal hernia    H/O iritis    LEFT EYE - STATES HER EYE BECOMES RED AND VERY SENSITIVE TO LIGHT WHEN FLARE UP OF IRITIS   Heart murmur    "benign; 06/2015"   High cholesterol    History of blood transfusion    "when I had my partial hysterectomy"   History of chickenpox 09/26/2014   History of measles    History of MRSA infection    History of shingles    Hypertension    Hypothyroidism    Medicare annual wellness visit, initial 04/07/2015   Multinodular thyroid    PT HAVING TROUBLE SWALLOWING   Neuropathy    FEET   Primary localized osteoarthritis of right knee    Sciatica of right side 04/25/2015   Spinal headache    spinal headache with epidural   Total knee replacement status, right    Type II diabetes mellitus (HCC)    ORAL MEDICATION - NO INSULIN   Wears glasses     Patient Active Problem List   Diagnosis Date Noted   History of recurrent infection 01/10/2022   Left wrist fracture, sequela 01/09/2022   Current use of beta blocker 06/12/2020    Hiatal hernia 04/22/2020   Hx of adenomatous colonic polyps 12/17/2019   History of Helicobacter infection 12/17/2019   Dysphagia 12/17/2019   Gastroesophageal reflux disease 12/17/2019   Foot pain, bilateral 11/18/2019   Breast cancer screening, high risk patient 11/18/2019   Pre-ulcerative corn or callous 03/21/2019   Dysuria 03/21/2019   Myalgia 01/12/2019   Hammertoes of both feet 11/23/2018   Numbness and tingling of foot 11/23/2018   Seasonal allergic rhinitis 06/14/2018   Hyperkalemia 05/04/2018   Carpal tunnel syndrome on right 05/04/2018   Left knee pain 11/03/2017   Right hip pain 03/17/2017   Urinary frequency 03/10/2017   Primary localized osteoarthritis of right knee    History of MRSA infection    History of measles    Dysrhythmia    Chronic rhinitis 06/06/2015   Shortness of breath 06/06/2015   Vaginitis and vulvovaginitis 04/25/2015   Sciatica of right side 04/25/2015   Preventative health care 04/07/2015   Allergic response 01/01/2015   History of chickenpox 09/26/2014   History of shingles    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 05/23/2014   Deformity of metatarsal bone of left foot 03/03/2014   Vulvar irritation  S/P neg vulvar bx 12/2013 01/23/2014   Multinodular non-toxic goiter 11/07/2013   Right knee DJD 07/12/2013   Neuralgia of left foot 06/24/2013   Tendonitis of ankle, right 04/08/2013   Vaginal dryness, menopausal 04/05/2013   Pronation deformity of ankle, acquired 02/09/2013   Cortical cataract 02/01/2013   H/O bone density study 01/26/2013   Chest pain 01/05/2013   History of iritis 01/04/2013   Dry eye syndrome 10/07/2012   Meibomian gland disease 10/07/2012   Gastroesophageal reflux disease without esophagitis 10/05/2012   Type 2 diabetes mellitus with diabetic neuropathy, without long-term current use of insulin (HCC) 10/04/2012   Essential hypertension 10/04/2012   Hyperlipidemia, mixed 10/04/2012    S/P right mastectomy 10/04/2012   Diabetic neuropathy (HCC) 10/04/2012   S/P hysterectomy 10/04/2012   Osteoporosis 10/04/2012   Hypothyroid 10/04/2012   History of cystocele 10/04/2012   Dyspareunia in female 10/04/2012   Plantar fasciitis 10/04/2012   Nuclear sclerotic cataract 06/17/2012   Iritis 01/06/2012   Peripheral neuropathy 01/06/2012   Disease of thyroid gland 01/06/2012   Breast cancer (HCC) 05/06/2011    Past Surgical History:  Procedure Laterality Date   ABDOMINAL HYSTERECTOMY  1988   ABDOMINOPLASTY  1986   ANTERIOR AND POSTERIOR REPAIR  05/11/2012   Procedure: ANTERIOR (CYSTOCELE) AND POSTERIOR REPAIR (RECTOCELE);  Surgeon: Martina Sinner, MD;  Location: [facility];  Service: Urology;;  with graft   BREAST BIOPSY Right    CARDIAC CATHETERIZATION  1999   COLONOSCOPY     CYSTOSCOPY WITH URETHRAL DILATATION  05/11/2012   Procedure: CYSTOSCOPY WITH URETHRAL DILATATION;  Surgeon: Martina Sinner, MD;  Location: [facility];  Service: Urology;;   ESOPHAGEAL MANOMETRY N/A 10/09/2021   Procedure: ESOPHAGEAL MANOMETRY (EM);  Surgeon: Lemar Lofty., MD;  Location: [facility];  Service: Gastroenterology;  Laterality: N/A;   ESOPHAGOGASTRODUODENOSCOPY     HERNIA REPAIR     MASTECTOMY Right 2007   PLANTAR FASCIA SURGERY Left    THYROIDECTOMY N/A 02/23/2014   Procedure: TOTAL THYROIDECTOMY;  Surgeon: Darnell Level, MD;  Location: [facility];  Service: General;  Laterality: N/A;   TOTAL KNEE ARTHROPLASTY Right 07/02/2015   TOTAL KNEE ARTHROPLASTY Right 07/02/2015   Procedure: TOTAL KNEE ARTHROPLASTY;  Surgeon: Salvatore Marvel, MD;  Location[facility];  Service: Orthopedics;  Laterality: Right;   TOTAL THYROIDECTOMY  1976   removed three tumor (2 on right; 1 on left)   TUBAL LIGATION     UMBILICAL HERNIA REPAIR  1986   VAGINAL PROLAPSE REPAIR  05/11/2012   Procedure: VAGINAL VAULT SUSPENSION;  Surgeon: Martina Sinner, MD;  Location: [facility];  Service: Urology;;   VULVA / PERINEUM  BIOPSY     vulvar    OB History     Gravida  3   Para  3   Term  3   Preterm      AB      Living  3      SAB      IAB      Ectopic      Multiple      Live Births               Home Medications    Prior to Admission medications   Medication Sig Start Date End Date Taking? Authorizing Provider  alendronate (FOSAMAX) 70 MG tablet Take 1 tablet (70 mg total) by mouth once a week. Take with a full glass of water on an empty stomach. 07/31/22  Bradd Canary, MD  aspirin 81 MG EC tablet Take 81 mg by mouth every other day. Swallow whole.    [provider]  atorvastatin (LIPITOR) 40 MG tablet TAKE 1 TABLET BY MOUTH EVERY OTHER DAY, ALTERNATING WITH 1/2 TABLET 06/20/22   Bradd Canary, MD  AVAPRO 75 MG tablet Take 1 tablet (75 mg total) by mouth daily. 02/11/23   Bradd Canary, MD  Calcium Carbonate-Vitamin D (OS-CAL 500 + D PO)     [provider]  Calcium Polycarbophil (FIBER) 625 MG TABS Take 1 tablet by mouth daily.    [provider]  Carboxymethylcellul-Glycerin (REFRESH OPTIVE OP) Apply to eye.    [provider]  Cholecalciferol (VITAMIN D) 2000 UNITS CAPS Take 1 capsule by mouth every evening.     [provider]  Ciclopirox 1 % shampoo Apply 1(one) application(s) topically 2(two) times a week 12/09/21     clindamycin (CLEOCIN) 150 MG capsule Take 3 capsules (450 mg total) by mouth 1 hour prior to dental appt 02/12/23     cycloSPORINE (RESTASIS) 0.05 % ophthalmic emulsion Place 1 drop into both eyes 2 (two) times daily. 09/19/22     diltiazem (TIADYLT ER) 360 MG 24 hr capsule Take 1 capsule (360 mg total) by mouth daily. 02/03/23   Bradd Canary, MD  Estradiol 10 MCG TABS vaginal tablet Place 1 tablet (10 mcg total) vaginally every other day. 09/22/22   Willodean Rosenthal, MD  gabapentin (NEURONTIN) 300 MG capsule Take 1 capsule (300 mg total) by mouth at bedtime. 09/16/22 09/16/23  Bradd Canary, MD  glucose  blood (ONE TOUCH TEST STRIPS) test strip Use as directed twice daily to check blood sugar. DX code E11.9 11/30/17   Bradd Canary, MD  levocetirizine (XYZAL) 5 MG tablet Take 1 tablet (5 mg total) by mouth every evening. 09/18/22 09/18/23  Verlee Monte, MD  levothyroxine (SYNTHROID) 88 MCG tablet Take 1 tablet (88 mcg total) by mouth daily. **SKIP SUNDAY** 12/16/22   Bradd Canary, MD  loteprednol (LOTEMAX) 0.5 % ophthalmic suspension Place 1 drop into both eyes 4 (four) times daily as needed with flare up Patient not taking: Reported on 02/10/2023 09/19/22     metFORMIN (GLUCOPHAGE-XR) 750 MG 24 hr tablet Take 1 tablet (750 mg total) by mouth in the morning and at bedtime. 12/16/22   Bradd Canary, MD  metoprolol succinate (TOPROL-XL) 25 MG 24 hr tablet Take 1 tablet (25 mg total) by mouth 2 (two) times daily. 02/11/23   Bradd Canary, MD  mometasone (NASONEX) 50 MCG/ACT nasal spray Place 1-2 sprays in each nostril daily as needed for stuffy nose. 09/18/22   Verlee Monte, MD  NEXIUM 40 MG capsule Take 1 capsule (40 mg total) by mouth daily. 09/16/22 09/16/23  Bradd Canary, MD  Probiotic Product (PROBIOTIC DAILY PO) Take 1 capsule by mouth daily.    [provider]  spironolactone (ALDACTONE) 25 MG tablet Take 1.5 tablets (37.5 mg total) by mouth daily. 02/11/23   Bradd Canary, MD  Vitamin D, Ergocalciferol, (DRISDOL) 1.25 MG (50000 UNIT) CAPS capsule Take 1 capsule (50,000 Units total) by mouth once a week for 6 doses. Patient not taking: Reported on 02/10/2023 10/24/22       Family History Family History  Problem Relation Age of Onset   Hypertension Maternal Grandmother    Heart disease Maternal Grandmother    Stroke Maternal Grandmother    Hypertension Maternal Grandfather  Hypertension Father    Diabetes Father    Alcohol abuse Father    Lung cancer Father        lung, psa   Hypertension Mother    Heart disease Mother        pacer, CHF   Hyperlipidemia Mother     Kidney disease Mother        1 kidney due to stone   Kidney Stones Mother    Diabetes Brother    Hypertension Brother    Hyperlipidemia Brother    Kidney disease Brother        dialysis   Stroke Brother    Hypertension Brother    Hyperlipidemia Brother    Hypertension Brother    Hyperlipidemia Brother    Benign prostatic hyperplasia Brother    Diabetes Sister    Hypertension Sister    Breast cancer Sister    Hyperlipidemia Sister    Hypertension Sister    Diabetes Sister    Thyroid disease Sister    Kidney disease Sister    Endometriosis Sister    Breast cancer Sister        thyroid and breast   Thyroid cancer Sister    Arthritis Sister        back pain   Hypertension Sister    Thyroid disease Sister    Hypertension Daughter    Hypertension Daughter    Hyperlipidemia Daughter    Diabetes Daughter    Arthritis Son    Hypertension Maternal Aunt    Hypertension Maternal Uncle    Hypertension Paternal Aunt    Cancer Paternal Aunt    Hypertension Paternal Uncle    Colon cancer Neg Hx    Stomach cancer Neg Hx    Esophageal cancer Neg Hx    Pancreatic cancer Neg Hx    Liver disease Neg Hx    Rectal cancer Neg Hx    Inflammatory bowel disease Neg Hx     Social History Social History   Tobacco Use   Smoking status: Never   Smokeless tobacco: Never  Vaping Use   Vaping status: Never Used  Substance Use Topics   Alcohol use: No    Alcohol/week: 0.0 standard drinks of alcohol   Drug use: No     Allergies   Codeine, Penicillins, Sulfa antibiotics, and Vicodin [hydrocodone-acetaminophen]   Review of Systems Review of Systems   Physical Exam Triage Vital Signs ED Triage Vitals [02/16/23 1248]  Encounter Vitals Group     BP 111/76     Systolic BP Percentile      Diastolic BP Percentile      Pulse Rate 74     Resp 17     Temp 97.7 F (36.5 C)     Temp Source Oral     SpO2 98 %     Weight      Height      Head Circumference      Peak Flow       Pain Score 2     Pain Loc      Pain Education      Exclude from Growth Chart    No data found.  Updated Vital Signs BP 111/76 (BP Location: Right Arm)   Pulse 74   Temp 97.7 F (36.5 C) (Oral)   Resp 17   SpO2 98%   Visual Acuity Right Eye Distance:   Left Eye Distance:   Bilateral Distance:    Right Eye Near:   Left  Eye Near:    Bilateral Near:     Physical Exam Vitals and nursing note reviewed.  Constitutional:      Appearance: Normal appearance.  HENT:     Head: Normocephalic and atraumatic.  Eyes:     Pupils: Pupils are equal, round, and reactive to light.  Cardiovascular:     Rate and Rhythm: Normal rate.  Pulmonary:     Effort: Pulmonary effort is normal.  Abdominal:     Tenderness: There is no right CVA tenderness or left CVA tenderness.  Skin:    General: Skin is warm and dry.  Neurological:     General: No focal deficit present.     Mental Status: She is alert and oriented to person, place, and time.  Psychiatric:        Mood and Affect: Mood normal.        Behavior: Behavior normal.      UC Treatments / Results  Labs (all labs ordered are listed, but only abnormal results are displayed) Labs Reviewed  URINE CULTURE  POCT URINALYSIS DIP (MANUAL ENTRY)  CERVICOVAGINAL ANCILLARY ONLY    EKG   Radiology No results found.  Procedures Procedures (including critical care time)  Medications Ordered in UC Medications - No data to display  Initial Impression / Assessment and Plan / UC Course  I have reviewed the triage vital signs and the nursing notes.  Pertinent labs & imaging results that were available during my care of the patient were reviewed by me and considered in my medical decision making (see chart for details).     Reviewed exam and symptoms with patient.  No red flags.  UA negative for UTI, will culture.  Vaginal swab for BV and yeast collected.  Will contact patient for any positive results.  Discussed dysuria/interstitial  cystitis.  She can do a trial of Azo OTC.  PCP follow-up if symptoms do not improve.  ER precautions reviewed. Final Clinical Impressions(s) / UC Diagnoses   Final diagnoses:  Dysuria     Discharge Instructions      The clinic will contact you with results of the urine culture and vaginal swab done today if positive.  You may take over-the-counter Azo as needed.  Follow-up with your PCP if your symptoms do not improve.  Please go to ER for any worsening symptoms.  I hope you feel better soon!    ED Prescriptions   None    PDMP not reviewed this encounter.   Radford Pax, NP 02/16/23 1318

## 2023-02-16 NOTE — Discharge Instructions (Signed)
The clinic will contact you with results of the urine culture and vaginal swab done today if positive.  You may take over-the-counter Azo as needed.  Follow-up with your PCP if your symptoms do not improve.  Please go to ER for any worsening symptoms.  I hope you feel better soon!

## 2023-02-16 NOTE — ED Triage Notes (Signed)
Pt presents with c/o urinary incontinence, dysuria X 4 days.   Pt states she feels pressure.

## 2023-02-17 LAB — CERVICOVAGINAL ANCILLARY ONLY
Bacterial Vaginitis (gardnerella): NEGATIVE
Candida Glabrata: NEGATIVE
Candida Vaginitis: NEGATIVE
Comment: NEGATIVE
Comment: NEGATIVE
Comment: NEGATIVE

## 2023-02-17 LAB — URINE CULTURE: Culture: NO GROWTH

## 2023-02-18 DIAGNOSIS — C50911 Malignant neoplasm of unspecified site of right female breast: Secondary | ICD-10-CM | POA: Diagnosis not present

## 2023-02-24 ENCOUNTER — Encounter: Payer: Self-pay | Admitting: Gastroenterology

## 2023-02-25 ENCOUNTER — Ambulatory Visit: Payer: Self-pay

## 2023-02-25 NOTE — Patient Instructions (Signed)
Visit Information  Thank you for taking time to visit with me today. Please don't hesitate to contact me if I can be of assistance to you.   Following are the goals we discussed today:  Continue to take medications as prescribed. Continue to attend provider visits as scheduled Continue to eat healthy, lean meats, vegetables, fruits, avoid saturated and transfats Contact provider with health questions or concerns as needed Continue to check blood sugar as recommended and notify provider if questions or concerns Continue to check blood pressure routinely and contact provider if questions or concerns  Our next appointment is by telephone on 03/27/23 at 11:00 am  Please call the care guide team at [phone] if you need to cancel or reschedule your appointment.   If you are experiencing a Mental Health or Behavioral Health Crisis or need someone to talk to, please call the Suicide and Crisis Lifeline: 988 call the [place] National Suicide Prevention Lifeline: 276-234-0120 or TTY: (424)573-8006 TTY 412 436 2744) to talk to a trained counselor call 1-800-273-TALK (toll free, 24 hour hotline)  Kathyrn Sheriff, RN, MSN, BSN, CCM Care Management Coordinator [phone]

## 2023-02-25 NOTE — Patient Outreach (Signed)
  Care Coordination   Initial Visit Note   02/25/2023 Name: ARDYTH KELSO MRN: [id number] DOB: 30-Jul-1950  Norm Salt is a 72 y.o. year old female who sees Bradd Canary, MD for primary care. I spoke with  Norm Salt by phone today.  What matters to the patients health and wellness today?  Patient with recent left knee replacement. Mrs. Bilton reports she has recovered from procedure and following up with orthopedic provider as recommended. 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 She denies any caregiver stress at this time. Patient reports she checks and records blood pressure twice a week-last reading 102/61. Reports blood sugar 101 today. She Request education regarding diabetes.  Goals Addressed             This Visit's Progress    COMPLETED: connect with RN for diabetes education       Activities and task to complete in order to accomplish goals.   Keep all upcoming appointment discussed today Continue with compliance of taking medication prescribed by Doctor I have scheduled a phone appointment with the RN Care Manager she will provide educational informational and assist you with managing your health needs      Education-diabetes       Interventions Today    Flowsheet Row Most Recent Value  Chronic Disease   Chronic disease during today's visit Diabetes  General Interventions   General Interventions Discussed/Reviewed General Interventions Discussed  [Evaluation of current treatment plan for health condition and patient's adherence to plan.]  Exercise Interventions   Exercise Discussed/Reviewed Exercise Discussed  [assessed activity/exercise level]  Education Interventions   Education Provided Provided Education, Provided Web-based Education  [article: diabetes and diet, the abcs of diabetes and type 2 diabetes]  Provided Verbal Education On Medication, When to see the doctor, Nutrition, Exercise   [advised to contact provider wth health questions or concerns, take medicatons as prescribed,]  Mental Health Interventions   Mental Health Discussed/Reviewed Mental Health Discussed  [assessed for care giver stress]  Nutrition Interventions   Nutrition Discussed/Reviewed Nutrition Discussed  Pharmacy Interventions   Pharmacy Dicussed/Reviewed Pharmacy Topics Discussed  Safety Interventions   Safety Discussed/Reviewed Safety Discussed            SDOH assessments and interventions completed:  Yes  SDOH Interventions Today    Flowsheet Row Most Recent Value  SDOH Interventions   Food Insecurity Interventions Intervention Not Indicated  Housing Interventions Intervention Not Indicated  Transportation Interventions Intervention Not Indicated  Utilities Interventions Intervention Not Indicated     Care Coordination Interventions:  Yes, provided   Follow up plan: Follow up call scheduled for 03/27/23    Encounter Outcome:  Patient Visit Completed   Kathyrn Sheriff, RN, MSN, BSN, CCM Care Management Coordinator [phone]

## 2023-03-02 ENCOUNTER — Ambulatory Visit: Payer: Medicare Other

## 2023-03-02 NOTE — Progress Notes (Signed)
Refurbished CFO's delivered  Addison Bailey CPed, CFo, CFm $90 admin charge

## 2023-03-04 ENCOUNTER — Encounter: Payer: Self-pay | Admitting: Gastroenterology

## 2023-03-04 ENCOUNTER — Ambulatory Visit: Payer: Medicare Other | Admitting: Gastroenterology

## 2023-03-04 VITALS — BP 135/66 | HR 54 | Temp 97.9°F | Resp 11 | Ht 64.0 in | Wt 160.0 lb

## 2023-03-04 DIAGNOSIS — Z1211 Encounter for screening for malignant neoplasm of colon: Secondary | ICD-10-CM

## 2023-03-04 DIAGNOSIS — I10 Essential (primary) hypertension: Secondary | ICD-10-CM | POA: Diagnosis not present

## 2023-03-04 DIAGNOSIS — D123 Benign neoplasm of transverse colon: Secondary | ICD-10-CM

## 2023-03-04 DIAGNOSIS — K635 Polyp of colon: Secondary | ICD-10-CM | POA: Diagnosis not present

## 2023-03-04 DIAGNOSIS — D125 Benign neoplasm of sigmoid colon: Secondary | ICD-10-CM | POA: Diagnosis not present

## 2023-03-04 DIAGNOSIS — D120 Benign neoplasm of cecum: Secondary | ICD-10-CM | POA: Diagnosis not present

## 2023-03-04 DIAGNOSIS — E039 Hypothyroidism, unspecified: Secondary | ICD-10-CM | POA: Diagnosis not present

## 2023-03-04 MED ORDER — SODIUM CHLORIDE 0.9 % IV SOLN: 500.0000 mL | INTRAVENOUS | Status: DC

## 2023-03-04 NOTE — Progress Notes (Signed)
Called to room to assist during endoscopic procedure.  Patient ID and intended procedure confirmed with present staff. Received instructions for my participation in the procedure from the performing physician.  

## 2023-03-04 NOTE — Patient Instructions (Addendum)
High fiber diet. Use FiberCon 1-2 tablets PO daily. Continue present medications. Await pathology results. Repeat colonoscopy in 3//75 years for surveillance based on pathology results (also based on patient's age and medical comorbidities).  Please read handouts provided                                                   YOU HAD AN ENDOSCOPIC PROCEDURE TODAY AT THE [facility]:   Refer to the procedure report that was given to you for any specific questions about what was found during the examination.  If the procedure report does not answer your questions, please call your gastroenterologist to clarify.  If you requested that your care partner not be given the details of your procedure findings, then the procedure report has been included in a sealed envelope for you to review at your convenience later.  YOU SHOULD EXPECT: Some feelings of bloating in the abdomen. Passage of more gas than usual.  Walking can help get rid of the air that was put into your GI tract during the procedure and reduce the bloating. If you had a lower endoscopy (such as a colonoscopy or flexible sigmoidoscopy) you may notice spotting of blood in your stool or on the toilet paper. If you underwent a bowel prep for your procedure, you may not have a normal bowel movement for a few days.  Please Note:  You might notice some irritation and congestion in your nose or some drainage.  This is from the oxygen used during your procedure.  There is no need for concern and it should clear up in a day or so.  SYMPTOMS TO REPORT IMMEDIATELY:  Following lower endoscopy (colonoscopy or flexible sigmoidoscopy):  Excessive amounts of blood in the stool  Significant tenderness or worsening of abdominal pains  Swelling of the abdomen that is new, acute  Fever of 100F or higher  For urgent or emergent issues, a gastroenterologist can be reached at any hour by calling [phone]. Do not use MyChart messaging for urgent  concerns.    DIET:  We do recommend a small meal at first, but then you may proceed to your regular diet.  Drink plenty of fluids but you should avoid alcoholic beverages for 24 hours.  ACTIVITY:  You should plan to take it easy for the rest of today and you should NOT DRIVE or use heavy machinery until tomorrow (because of the sedation medicines used during the test).    FOLLOW UP: Our staff will call the number listed on your records the next business day following your procedure.  We will call around 7:15- 8:00 am to check on you and address any questions or concerns that you may have regarding the information given to you following your procedure. If we do not reach you, we will leave a message.     If any biopsies were taken you will be contacted by phone or by letter within the next 1-3 weeks.  Please call us at [phone] if you have not heard about the biopsies in 3 weeks.    SIGNATURES/CONFIDENTIALITY: You and/or your care partner have signed paperwork which will be entered into your electronic medical record.  These signatures attest to the fact that that the information above on your After Visit Summary has been reviewed and is understood.  Full  responsibility of the confidentiality of this discharge information lies with you and/or your care-partner.

## 2023-03-04 NOTE — Progress Notes (Signed)
GASTROENTEROLOGY PROCEDURE H&P NOTE   Primary Care Physician: Bradd Canary, MD  HPI: Victoria Walker is a 72 y.o. female who presents for Colonoscopy for surveillance of prior polyps.  Past Medical History:  Diagnosis Date   Arthritis    "both knees" (07/02/2015)   Cancer of right breast (HCC)    Claustrophobia    Dysrhythmia    HX OF FAST HEART RATE AND PALPITATIONS - METOPROLOL HAS HELPED   GERD (gastroesophageal reflux disease)    H/O hiatal hernia    H/O iritis    LEFT EYE - STATES HER EYE BECOMES RED AND VERY SENSITIVE TO LIGHT WHEN FLARE UP OF IRITIS   Heart murmur    "benign; 06/2015"   High cholesterol    History of blood transfusion    "when I had my partial hysterectomy"   History of chickenpox 09/26/2014   History of measles    History of MRSA infection    History of shingles    Hypertension    Hypothyroidism    Medicare annual wellness visit, initial 04/07/2015   Multinodular thyroid    PT HAVING TROUBLE SWALLOWING   Neuropathy    FEET   Primary localized osteoarthritis of right knee    Sciatica of right side 04/25/2015   Spinal headache    spinal headache with epidural   Total knee replacement status, right    Type II diabetes mellitus (HCC)    ORAL MEDICATION - NO INSULIN   Wears glasses    Past Surgical History:  Procedure Laterality Date   ABDOMINAL HYSTERECTOMY  1988   ABDOMINOPLASTY  1986   ANTERIOR AND POSTERIOR REPAIR  05/11/2012   Procedure: ANTERIOR (CYSTOCELE) AND POSTERIOR REPAIR (RECTOCELE);  Surgeon: Martina Sinner, MD;  Location: [facility];  Service: Urology;;  with graft   BREAST BIOPSY Right    CARDIAC CATHETERIZATION  1999   COLONOSCOPY     CYSTOSCOPY WITH URETHRAL DILATATION  05/11/2012   Procedure: CYSTOSCOPY WITH URETHRAL DILATATION;  Surgeon: Martina Sinner, MD;  Location: [facility];  Service: Urology;;   ESOPHAGEAL MANOMETRY N/A 10/09/2021   Procedure: ESOPHAGEAL MANOMETRY (EM);  Surgeon: Lemar Lofty., MD;   Location: [facility];  Service: Gastroenterology;  Laterality: N/A;   ESOPHAGOGASTRODUODENOSCOPY     HERNIA REPAIR     MASTECTOMY Right 2007   PLANTAR FASCIA SURGERY Left    THYROIDECTOMY N/A 02/23/2014   Procedure: TOTAL THYROIDECTOMY;  Surgeon: Darnell Level, MD;  Location: [facility];  Service: General;  Laterality: N/A;   TOTAL KNEE ARTHROPLASTY Right 07/02/2015   TOTAL KNEE ARTHROPLASTY Right 07/02/2015   Procedure: TOTAL KNEE ARTHROPLASTY;  Surgeon: Salvatore Marvel, MD;  Location: [facility];  Service: Orthopedics;  Laterality: Right;   TOTAL THYROIDECTOMY  1976   removed three tumor (2 on right; 1 on left)   TUBAL LIGATION     UMBILICAL HERNIA REPAIR  1986   VAGINAL PROLAPSE REPAIR  05/11/2012   Procedure: VAGINAL VAULT SUSPENSION;  Surgeon: Martina Sinner, MD;  Location: [facility];  Service: Urology;;   VULVA / PERINEUM BIOPSY     vulvar   Current Outpatient Medications  Medication Sig Dispense Refill   alendronate (FOSAMAX) 70 MG tablet Take 1 tablet (70 mg total) by mouth once a week. Take with a full glass of water on an empty stomach. 12 tablet 3   aspirin 81 MG EC tablet Take 81 mg by mouth every other day. Swallow whole.     atorvastatin (  LIPITOR) 40 MG tablet TAKE 1 TABLET BY MOUTH EVERY OTHER DAY, ALTERNATING WITH 1/2 TABLET 180 tablet 1   AVAPRO 75 MG tablet Take 1 tablet (75 mg total) by mouth daily. 90 tablet 1   Calcium Carbonate-Vitamin D (OS-CAL 500 + D PO)      Calcium Polycarbophil (FIBER) 625 MG TABS Take 1 tablet by mouth daily.     Carboxymethylcellul-Glycerin (REFRESH OPTIVE OP) Apply to eye.     Cholecalciferol (VITAMIN D) 2000 UNITS CAPS Take 1 capsule by mouth every evening.      Ciclopirox 1 % shampoo Apply 1(one) application(s) topically 2(two) times a week 120 mL 11   cycloSPORINE (RESTASIS) 0.05 % ophthalmic emulsion Place 1 drop into both eyes 2 (two) times daily. 180 each 3   diltiazem (TIADYLT ER) 360 MG 24 hr capsule Take 1 capsule (360 mg total) by mouth  daily. 90 capsule 1   Estradiol 10 MCG TABS vaginal tablet Place 1 tablet (10 mcg total) vaginally every other day. 48 tablet 4   gabapentin (NEURONTIN) 300 MG capsule Take 1 capsule (300 mg total) by mouth at bedtime. 90 capsule 1   glucose blood (ONE TOUCH TEST STRIPS) test strip Use as directed twice daily to check blood sugar. DX code E11.9 300 each 1   levocetirizine (XYZAL) 5 MG tablet Take 1 tablet (5 mg total) by mouth every evening. 90 tablet 3   levothyroxine (SYNTHROID) 88 MCG tablet Take 1 tablet (88 mcg total) by mouth daily. **SKIP SUNDAY** 90 tablet 0   metFORMIN (GLUCOPHAGE-XR) 750 MG 24 hr tablet Take 1 tablet (750 mg total) by mouth in the morning and at bedtime. 180 tablet 0   metoprolol succinate (TOPROL-XL) 25 MG 24 hr tablet Take 1 tablet (25 mg total) by mouth 2 (two) times daily. 180 tablet 1   mometasone (NASONEX) 50 MCG/ACT nasal spray Place 1-2 sprays in each nostril daily as needed for stuffy nose. 51 g 3   NEXIUM 40 MG capsule Take 1 capsule (40 mg total) by mouth daily. 90 capsule 1   Probiotic Product (PROBIOTIC DAILY PO) Take 1 capsule by mouth daily.     spironolactone (ALDACTONE) 25 MG tablet Take 1.5 tablets (37.5 mg total) by mouth daily. 135 tablet 1   clindamycin (CLEOCIN) 150 MG capsule Take 3 capsules (450 mg total) by mouth 1 hour prior to dental appt 3 capsule 0   loteprednol (LOTEMAX) 0.5 % ophthalmic suspension Place 1 drop into both eyes 4 (four) times daily as needed with flare up 5 mL 3   Current Facility-Administered Medications  Medication Dose Route Frequency Provider Last Rate Last Admin   0.9 %  sodium chloride infusion  500 mL Intravenous Continuous Mansouraty, Netty Starring., MD        Current Outpatient Medications:    alendronate (FOSAMAX) 70 MG tablet, Take 1 tablet (70 mg total) by mouth once a week. Take with a full glass of water on an empty stomach., Disp: 12 tablet, Rfl: 3   aspirin 81 MG EC tablet, Take 81 mg by mouth every other day.  Swallow whole., Disp: , Rfl:    atorvastatin (LIPITOR) 40 MG tablet, TAKE 1 TABLET BY MOUTH EVERY OTHER DAY, ALTERNATING WITH 1/2 TABLET, Disp: 180 tablet, Rfl: 1   AVAPRO 75 MG tablet, Take 1 tablet (75 mg total) by mouth daily., Disp: 90 tablet, Rfl: 1   Calcium Carbonate-Vitamin D (OS-CAL 500 + D PO), , Disp: , Rfl:    Calcium  Polycarbophil (FIBER) 625 MG TABS, Take 1 tablet by mouth daily., Disp: , Rfl:    Carboxymethylcellul-Glycerin (REFRESH OPTIVE OP), Apply to eye., Disp: , Rfl:    Cholecalciferol (VITAMIN D) 2000 UNITS CAPS, Take 1 capsule by mouth every evening. , Disp: , Rfl:    Ciclopirox 1 % shampoo, Apply 1(one) application(s) topically 2(two) times a week, Disp: 120 mL, Rfl: 11   cycloSPORINE (RESTASIS) 0.05 % ophthalmic emulsion, Place 1 drop into both eyes 2 (two) times daily., Disp: 180 each, Rfl: 3   diltiazem (TIADYLT ER) 360 MG 24 hr capsule, Take 1 capsule (360 mg total) by mouth daily., Disp: 90 capsule, Rfl: 1   Estradiol 10 MCG TABS vaginal tablet, Place 1 tablet (10 mcg total) vaginally every other day., Disp: 48 tablet, Rfl: 4   gabapentin (NEURONTIN) 300 MG capsule, Take 1 capsule (300 mg total) by mouth at bedtime., Disp: 90 capsule, Rfl: 1   glucose blood (ONE TOUCH TEST STRIPS) test strip, Use as directed twice daily to check blood sugar. DX code E11.9, Disp: 300 each, Rfl: 1   levocetirizine (XYZAL) 5 MG tablet, Take 1 tablet (5 mg total) by mouth every evening., Disp: 90 tablet, Rfl: 3   levothyroxine (SYNTHROID) 88 MCG tablet, Take 1 tablet (88 mcg total) by mouth daily. **SKIP SUNDAY**, Disp: 90 tablet, Rfl: 0   metFORMIN (GLUCOPHAGE-XR) 750 MG 24 hr tablet, Take 1 tablet (750 mg total) by mouth in the morning and at bedtime., Disp: 180 tablet, Rfl: 0   metoprolol succinate (TOPROL-XL) 25 MG 24 hr tablet, Take 1 tablet (25 mg total) by mouth 2 (two) times daily., Disp: 180 tablet, Rfl: 1   mometasone (NASONEX) 50 MCG/ACT nasal spray, Place 1-2 sprays in each  nostril daily as needed for stuffy nose., Disp: 51 g, Rfl: 3   NEXIUM 40 MG capsule, Take 1 capsule (40 mg total) by mouth daily., Disp: 90 capsule, Rfl: 1   Probiotic Product (PROBIOTIC DAILY PO), Take 1 capsule by mouth daily., Disp: , Rfl:    spironolactone (ALDACTONE) 25 MG tablet, Take 1.5 tablets (37.5 mg total) by mouth daily., Disp: 135 tablet, Rfl: 1   clindamycin (CLEOCIN) 150 MG capsule, Take 3 capsules (450 mg total) by mouth 1 hour prior to dental appt, Disp: 3 capsule, Rfl: 0   loteprednol (LOTEMAX) 0.5 % ophthalmic suspension, Place 1 drop into both eyes 4 (four) times daily as needed with flare up, Disp: 5 mL, Rfl: 3  Current Facility-Administered Medications:    0.9 %  sodium chloride infusion, 500 mL, Intravenous, Continuous, Mansouraty, Netty Starring., MD Allergies  Allergen Reactions   Codeine Anxiety    Jittery    Penicillins Itching   Sulfa Antibiotics Itching   Vicodin [Hydrocodone-Acetaminophen] Itching    ITCHING    Family History  Problem Relation Age of Onset   Hypertension Maternal Grandmother    Heart disease Maternal Grandmother    Stroke Maternal Grandmother    Hypertension Maternal Grandfather    Hypertension Father    Diabetes Father    Alcohol abuse Father    Lung cancer Father        lung, psa   Hypertension Mother    Heart disease Mother        pacer, CHF   Hyperlipidemia Mother    Kidney disease Mother        1 kidney due to stone   Kidney Stones Mother    Diabetes Brother    Hypertension Brother  Hyperlipidemia Brother    Kidney disease Brother        dialysis   Stroke Brother    Hypertension Brother    Hyperlipidemia Brother    Hypertension Brother    Hyperlipidemia Brother    Benign prostatic hyperplasia Brother    Diabetes Sister    Hypertension Sister    Breast cancer Sister    Hyperlipidemia Sister    Hypertension Sister    Diabetes Sister    Thyroid disease Sister    Kidney disease Sister    Endometriosis Sister     Breast cancer Sister        thyroid and breast   Thyroid cancer Sister    Arthritis Sister        back pain   Hypertension Sister    Thyroid disease Sister    Hypertension Daughter    Hypertension Daughter    Hyperlipidemia Daughter    Diabetes Daughter    Arthritis Son    Hypertension Maternal Aunt    Hypertension Maternal Uncle    Hypertension Paternal Aunt    Cancer Paternal Aunt    Hypertension Paternal Uncle    Colon cancer Neg Hx    Stomach cancer Neg Hx    Esophageal cancer Neg Hx    Pancreatic cancer Neg Hx    Liver disease Neg Hx    Rectal cancer Neg Hx    Inflammatory bowel disease Neg Hx    Social History   Socioeconomic History   Marital status: Married    Spouse name: Not on file   Number of children: 3   Years of education: Not on file   Highest education level: Not on file  Occupational History    Comment: Retired  Tobacco Use   Smoking status: Never   Smokeless tobacco: Never  Vaping Use   Vaping status: Never Used  Substance and Sexual Activity   Alcohol use: No    Alcohol/week: 0.0 standard drinks of alcohol   Drug use: No   Sexual activity: Not Currently    Birth control/protection: Surgical    Comment: lives with husband, retired from various jobs, diabetic diet  Other Topics Concern   Not on file  Social History Narrative   Not on file   Social Determinants of Health   Financial Resource Strain: Low Risk  (11/13/2022)   Overall Financial Resource Strain (CARDIA)    Difficulty of Paying Living Expenses: Not hard at all  Food Insecurity: No Food Insecurity (02/25/2023)   Hunger Vital Sign    Worried About Running Out of Food in the Last Year: Never true    Ran Out of Food in the Last Year: Never true  Transportation Needs: No Transportation Needs (02/25/2023)   PRAPARE - Administrator, Civil Service (Medical): No    Lack of Transportation (Non-Medical): No  Physical Activity: Inactive (12/03/2022)   Exercise Vital Sign     Days of Exercise per Week: 0 days    Minutes of Exercise per Session: 0 min  Stress: No Stress Concern Present (11/13/2022)   [employer] of Occupational Health - Occupational Stress Questionnaire    Feeling of Stress : Only a little  Social Connections: Socially Integrated (12/03/2022)   Social Connection and Isolation Panel [NHANES]    Frequency of Communication with Friends and Family: More than three times a week    Frequency of Social Gatherings with Friends and Family: Once a week    Attends Religious Services: More than  4 times per year    Active Member of Clubs or Organizations: Yes    Attends Banker Meetings: More than 4 times per year    Marital Status: Married  Catering manager Violence: Not At Risk (02/25/2023)   Humiliation, Afraid, Rape, and Kick questionnaire    Fear of Current or Ex-Partner: No    Emotionally Abused: No    Physically Abused: No    Sexually Abused: No    Physical Exam: Today's Vitals   03/04/23 1035  BP: 130/80  Pulse: 65  Temp: 97.9 F (36.6 C)  TempSrc: Temporal  SpO2: 96%  Weight: 160 lb (72.6 kg)  Height: 5\' 4"  (1.626 m)   Body mass index is 27.46 kg/m. GEN: NAD EYE: Sclerae anicteric ENT: MMM CV: Non-tachycardic GI: Soft, NT/ND NEURO:  Alert & Oriented x 3  Lab Results: No results for input(s): "WBC", "HGB", "HCT", "PLT" in the last 72 hours. BMET No results for input(s): "NA", "K", "CL", "CO2", "GLUCOSE", "BUN", "CREATININE", "CALCIUM" in the last 72 hours. LFT No results for input(s): "PROT", "ALBUMIN", "AST", "ALT", "ALKPHOS", "BILITOT", "BILIDIR", "IBILI" in the last 72 hours. PT/INR No results for input(s): "LABPROT", "INR" in the last 72 hours.   Impression / Plan: This is a 72 y.o.female who presents for Colonoscopy for surveillance of prior polyps.  The risks and benefits of endoscopic evaluation/treatment were discussed with the patient and/or family; these include but are not limited to the risk of  perforation, infection, bleeding, missed lesions, lack of diagnosis, severe illness requiring hospitalization, as well as anesthesia and sedation related illnesses.  The patient's history has been reviewed, patient examined, no change in status, and deemed stable for procedure.  The patient and/or family is agreeable to proceed.    Corliss Parish, MD [facility] Office # [phone]

## 2023-03-04 NOTE — Progress Notes (Signed)
Vss nad trans to pacu 

## 2023-03-04 NOTE — Op Note (Signed)
[facility] Patient Name: Victoria Walker Procedure Date: 03/04/2023 11:37 AM MRN: [id number] Endoscopist: Corliss Parish , MD, [id number] Age: 72 Referring MD:  Date of Birth: [birth year] Gender: Female Account #: [id number] Procedure:                Colonoscopy Indications:              Surveillance: Personal history of adenomatous                            polyps on last colonoscopy 3 years ago Medicines:                Monitored Anesthesia Care Procedure:                Pre-Anesthesia Assessment:                           - Prior to the procedure, a History and Physical                            was performed, and patient medications and                            allergies were reviewed. The patient's tolerance of                            previous anesthesia was also reviewed. The risks                            and benefits of the procedure and the sedation                            options and risks were discussed with the patient.                            All questions were answered, and informed consent                            was obtained. Prior Anticoagulants: The patient has                            taken no anticoagulant or antiplatelet agents                            except for aspirin. ASA Grade Assessment: II - A                            patient with mild systemic disease. After reviewing                            the risks and benefits, the patient was deemed in                            satisfactory condition to undergo the procedure.  After obtaining informed consent, the colonoscope                            was passed under direct vision. Throughout the                            procedure, the patient's blood pressure, pulse, and                            oxygen saturations were monitored continuously. The                            Olympus Scope SN: [id number] was introduced through                             the anus and advanced to the the cecum, identified                            by the appendiceal orifice, ileocecal valve and                            palpation. The colonoscopy was performed without                            difficulty. The patient tolerated the procedure.                            The quality of the bowel preparation was good. The                            ileocecal valve, appendiceal orifice, and rectum                            were photographed. Scope In: 11:50:20 AM Scope Out: 12:02:31 PM Scope Withdrawal Time: 0 hours 9 minutes 34 seconds  Total Procedure Duration: 0 hours 12 minutes 11 seconds  Findings:                 The digital rectal exam findings include                            hemorrhoids. Pertinent negatives include no                            palpable rectal lesions.                           Six sessile polyps were found in the sigmoid colon                            (1), transverse colon (4) and cecum (1). The polyps                            were 2 to 5 mm in size. These polyps were removed  with a cold snare. Resection and retrieval were                            complete.                           Multiple small-mouthed diverticula were found in                            the recto-sigmoid colon and sigmoid colon.                           Normal mucosa was found in the entire colon                            otherwise.                           Non-bleeding non-thrombosed external and internal                            hemorrhoids were found during retroflexion, during                            perianal exam and during digital exam. The                            hemorrhoids were Grade II (internal hemorrhoids                            that prolapse but reduce spontaneously). Complications:            No immediate complications. Estimated Blood Loss:     Estimated blood loss was minimal. Impression:                - Hemorrhoids found on digital rectal exam.                           - Six 2 to 5 mm polyps in the sigmoid colon, in the                            transverse colon and in the cecum, removed with a                            cold snare. Resected and retrieved.                           - Diverticulosis in the recto-sigmoid colon and in                            the sigmoid colon.                           - Normal mucosa in the entire examined colon  otherwise.                           - Non-bleeding non-thrombosed external and internal                            hemorrhoids. Recommendation:           - The patient will be observed post-procedure,                            until all discharge criteria are met.                           - Discharge patient to home.                           - Patient has a contact number available for                            emergencies. The signs and symptoms of potential                            delayed complications were discussed with the                            patient. Return to normal activities tomorrow.                            Written discharge instructions were provided to the                            patient.                           - High fiber diet.                           - Use FiberCon 1-2 tablets PO daily.                           - Continue present medications.                           - Await pathology results.                           - Repeat colonoscopy in 3//75 years for                            surveillance based on pathology results (also based                            on patient's age and medical comorbidities.                           - The findings and recommendations were discussed  with the patient.                           - The findings and recommendations were discussed                            with the patient's family. Corliss Parish, MD 03/04/2023 12:06:32 PM

## 2023-03-05 ENCOUNTER — Telehealth: Payer: Self-pay

## 2023-03-05 NOTE — Telephone Encounter (Signed)
  Follow up Call-     03/04/2023   10:29 AM 05/23/2021    9:07 AM  Call back number  Post procedure Call Back phone  # [phone]  Permission to leave phone message Yes Yes     Patient questions:  Do you have a fever, pain , or abdominal swelling? No. Pain Score  0 *  Have you tolerated food without any problems? Yes.    Have you been able to return to your normal activities? Yes.    Do you have any questions about your discharge instructions: Diet   No. Medications  No. Follow up visit  No.  Do you have questions or concerns about your Care? No.  Actions: * If pain score is 4 or above: No action needed, pain <4.

## 2023-03-06 LAB — SURGICAL PATHOLOGY

## 2023-03-09 ENCOUNTER — Encounter: Payer: Self-pay | Admitting: Gastroenterology

## 2023-03-11 ENCOUNTER — Ambulatory Visit: Payer: Medicare Other

## 2023-03-12 DIAGNOSIS — M654 Radial styloid tenosynovitis [de Quervain]: Secondary | ICD-10-CM | POA: Diagnosis not present

## 2023-03-13 ENCOUNTER — Ambulatory Visit (INDEPENDENT_AMBULATORY_CARE_PROVIDER_SITE_OTHER): Payer: Medicare Other

## 2023-03-13 DIAGNOSIS — E538 Deficiency of other specified B group vitamins: Secondary | ICD-10-CM | POA: Diagnosis not present

## 2023-03-13 MED ORDER — CYANOCOBALAMIN 1000 MCG/ML IJ SOLN
1000.0000 ug | Freq: Once | INTRAMUSCULAR | Status: AC
  Administered 2023-03-13: 1000 ug via INTRAMUSCULAR

## 2023-03-13 NOTE — Progress Notes (Signed)
Pt here for monthly B12 injection per Hyman Hopes   B12 given IM L deltoid, and pt tolerated injection well.  Next B12 injection scheduled for 04/14/2023. Also scheduled pt a follow up lab appointment 04/14/2023

## 2023-03-17 ENCOUNTER — Other Ambulatory Visit: Payer: Self-pay | Admitting: Family Medicine

## 2023-03-17 DIAGNOSIS — E785 Hyperlipidemia, unspecified: Secondary | ICD-10-CM

## 2023-03-17 DIAGNOSIS — I10 Essential (primary) hypertension: Secondary | ICD-10-CM

## 2023-03-17 DIAGNOSIS — K219 Gastro-esophageal reflux disease without esophagitis: Secondary | ICD-10-CM

## 2023-03-17 DIAGNOSIS — Z780 Asymptomatic menopausal state: Secondary | ICD-10-CM

## 2023-03-17 DIAGNOSIS — R109 Unspecified abdominal pain: Secondary | ICD-10-CM

## 2023-03-18 ENCOUNTER — Other Ambulatory Visit: Payer: Self-pay

## 2023-03-18 ENCOUNTER — Other Ambulatory Visit (HOSPITAL_BASED_OUTPATIENT_CLINIC_OR_DEPARTMENT_OTHER): Payer: Self-pay

## 2023-03-18 MED ORDER — GABAPENTIN 300 MG PO CAPS
300.0000 mg | ORAL_CAPSULE | Freq: Every day | ORAL | 1 refills | Status: DC
  Filled 2023-03-18: qty 90, 90d supply, fill #0
  Filled 2023-06-12: qty 90, 90d supply, fill #1

## 2023-03-18 MED ORDER — NEXIUM 40 MG PO CPDR
40.0000 mg | DELAYED_RELEASE_CAPSULE | Freq: Every day | ORAL | 1 refills | Status: DC
  Filled 2023-03-18: qty 90, 90d supply, fill #0
  Filled 2023-06-12: qty 30, 30d supply, fill #1
  Filled 2023-07-07 – 2023-08-11 (×3): qty 30, 30d supply, fill #2
  Filled 2023-09-12: qty 30, 30d supply, fill #3

## 2023-03-18 MED ORDER — METFORMIN HCL ER 750 MG PO TB24
750.0000 mg | ORAL_TABLET | Freq: Two times a day (BID) | ORAL | 0 refills | Status: DC
  Filled 2023-03-18: qty 180, 90d supply, fill #0

## 2023-03-18 MED ORDER — LEVOTHYROXINE SODIUM 88 MCG PO TABS
88.0000 ug | ORAL_TABLET | Freq: Every day | ORAL | 0 refills | Status: DC
  Filled 2023-03-18: qty 90, 90d supply, fill #0

## 2023-03-27 ENCOUNTER — Ambulatory Visit: Payer: Self-pay

## 2023-03-27 NOTE — Patient Outreach (Signed)
  Care Coordination   Follow Up Visit Note   03/27/2023 Name: Victoria Walker MRN: [id number] DOB: Aug 19, 1950  Victoria Walker is a 72 y.o. year old female who sees Bradd Canary, MD for primary care. I spoke with  Victoria Walker by phone today.  What matters to the patients health and wellness today?  Victoria Walker reports she is doing well. She reports her blood sugars are controlled. She states she is incorporating more fiber in her diet. Victoria Walker request RNCM contact her next month as she is "not able to talk long today".  Goals Addressed             This Visit's Progress    Education-diabetes       Interventions Today    Flowsheet Row Most Recent Value  Chronic Disease   Chronic disease during today's visit Diabetes  General Interventions   General Interventions Discussed/Reviewed General Interventions Reviewed, Doctor Visits  [Evaluation of current treatment plan for health condition and patient's adherence to plan.]  Doctor Visits Discussed/Reviewed PCP, Specialist  PCP/Specialist Visits Compliance with follow-up visit  [reviewed upcoming provider visits]  Education Interventions   Education Provided Provided Education  [confirmed patient has received educational material and has started reviewing it.]  Provided Verbal Education On Nutrition, When to see the doctor, Medication, Blood Sugar Monitoring  [advised to continue to take medications as prescribed, attend provider appointments as scheduled, check blood sugars as recommended and contact provider with any questions or concerns.]  Pharmacy Interventions   Pharmacy Dicussed/Reviewed Pharmacy Topics Discussed            SDOH assessments and interventions completed:  No  Care Coordination Interventions:  Yes, provided   Follow up plan: Follow up call scheduled for 04/28/23    Encounter Outcome:  Patient Visit Completed   Kathyrn Sheriff, RN, MSN, BSN, CCM Care Management Coordinator [phone]

## 2023-03-27 NOTE — Patient Instructions (Signed)
Visit Information  Thank you for taking time to visit with me today. Please don't hesitate to contact me if I can be of assistance to you.   Following are the goals we discussed today:  Continue to take medications as prescribed. Continue to attend provider visits as scheduled Continue to eat healthy, lean meats, vegetables, fruits, avoid saturated and transfats Contact provider with health questions or concerns as needed Continue to check blood sugar as recommended and notify provider if questions or concerns  Our next appointment is by telephone on 04/28/23 at 10:30 am  Please call the care guide team at [phone] if you need to cancel or reschedule your appointment.   If you are experiencing a Mental Health or Behavioral Health Crisis or need someone to talk to, please call the Suicide and Crisis Lifeline: 988 call the [place] National Suicide Prevention Lifeline: (407) 143-1387 or TTY: (949)146-5867 TTY [phone]) to talk to a trained counselor  Kathyrn Sheriff, RN, MSN, BSN, CCM Care Management Coordinator [phone]

## 2023-03-31 ENCOUNTER — Other Ambulatory Visit: Payer: Self-pay

## 2023-04-01 ENCOUNTER — Other Ambulatory Visit (HOSPITAL_BASED_OUTPATIENT_CLINIC_OR_DEPARTMENT_OTHER): Payer: Self-pay

## 2023-04-13 NOTE — Progress Notes (Unsigned)
Victoria Walker is a 72 y.o. female presents to the office today for 2nd Monthly B12 injection, per physician's orders. Original order: . 01/05/2023: "Yes, she can do injections. Weekly for 4 weeks then monthly. Repeat CBC and B12 in 8 weeks." Cyanocobalamin 1000 mg/ml IM was administered Left Deltoid today. Patient tolerated injection. Patient due for follow up labs/provider appt: labs done today.  Patient next injection due: 1 month, appt made [name], [name]

## 2023-04-14 ENCOUNTER — Other Ambulatory Visit (INDEPENDENT_AMBULATORY_CARE_PROVIDER_SITE_OTHER): Payer: Medicare Other

## 2023-04-14 ENCOUNTER — Ambulatory Visit (INDEPENDENT_AMBULATORY_CARE_PROVIDER_SITE_OTHER): Payer: Medicare Other

## 2023-04-14 DIAGNOSIS — R7989 Other specified abnormal findings of blood chemistry: Secondary | ICD-10-CM | POA: Diagnosis not present

## 2023-04-14 DIAGNOSIS — E538 Deficiency of other specified B group vitamins: Secondary | ICD-10-CM

## 2023-04-14 LAB — CBC WITH DIFFERENTIAL/PLATELET
Basophils Absolute: 0 10*3/uL (ref 0.0–0.1)
Basophils Relative: 0.4 % (ref 0.0–3.0)
Eosinophils Absolute: 0.1 10*3/uL (ref 0.0–0.7)
Eosinophils Relative: 0.8 % (ref 0.0–5.0)
HCT: 39.5 % (ref 36.0–46.0)
Hemoglobin: 12.9 g/dL (ref 12.0–15.0)
Lymphocytes Relative: 35.3 % (ref 12.0–46.0)
Lymphs Abs: 2.3 10*3/uL (ref 0.7–4.0)
MCHC: 32.5 g/dL (ref 30.0–36.0)
MCV: 98.8 fL (ref 78.0–100.0)
Monocytes Absolute: 0.5 10*3/uL (ref 0.1–1.0)
Monocytes Relative: 7.1 % (ref 3.0–12.0)
Neutro Abs: 3.7 10*3/uL (ref 1.4–7.7)
Neutrophils Relative %: 56.4 % (ref 43.0–77.0)
Platelets: 283 10*3/uL (ref 150.0–400.0)
RBC: 4 Mil/uL (ref 3.87–5.11)
RDW: 13.5 % (ref 11.5–15.5)
WBC: 6.5 10*3/uL (ref 4.0–10.5)

## 2023-04-14 LAB — B12 AND FOLATE PANEL
Folate: 10.6 ng/mL (ref >5.9)
Vitamin B-12: 199 pg/mL — ABNORMAL LOW (ref 211–911)

## 2023-04-14 MED ORDER — CYANOCOBALAMIN 1000 MCG/ML IJ SOLN
1000.0000 ug | Freq: Once | INTRAMUSCULAR | Status: AC
  Administered 2023-04-14: 1000 ug via INTRAMUSCULAR

## 2023-04-16 DIAGNOSIS — C50911 Malignant neoplasm of unspecified site of right female breast: Secondary | ICD-10-CM | POA: Diagnosis not present

## 2023-04-23 DIAGNOSIS — M654 Radial styloid tenosynovitis [de Quervain]: Secondary | ICD-10-CM | POA: Diagnosis not present

## 2023-04-27 ENCOUNTER — Other Ambulatory Visit (HOSPITAL_BASED_OUTPATIENT_CLINIC_OR_DEPARTMENT_OTHER): Payer: Self-pay

## 2023-04-28 ENCOUNTER — Ambulatory Visit: Payer: Self-pay

## 2023-04-28 NOTE — Patient Instructions (Addendum)
 Visit Information  Thank you for taking time to visit with me today. Please don't hesitate to contact me if I can be of assistance to you.   Following are the goals we discussed today:  Continue to take medications as prescribed. Continue to attend provider visits as scheduled Continue to eat healthy, lean meats, vegetables, fruits, avoid saturated and transfats. Do not skip meals. Contact provider with health questions or concerns as needed Continue to check blood sugar as recommended and notify provider if questions or concerns Continue to check blood pressure routinely and contact provider if questions or concerns  Our next appointment is by telephone on 06/18/23 at 1:15 pm  Please call the care guide team at [phone] if you need to cancel or reschedule your appointment.   If you are experiencing a Mental Health or Behavioral Health Crisis or need someone to talk to, please call the Suicide and Crisis Lifeline: 988 call the [place]  National Suicide Prevention Lifeline: (254)651-4538 or TTY: 713-498-8201 TTY 8015195838) to talk to a trained counselor   Heddy Shutter, RN, MSN, BSN, CCM Care Management Coordinator [phone]   Hypoglycemia Hypoglycemia is when the amount of sugar, or glucose, in your blood is too low. Low blood sugar can happen if you have diabetes or if you don't have diabetes. It may be an emergency. What are the causes? Low blood sugar happens most often in people who have diabetes. It may be caused by: Diabetes medicine. Not eating enough, or not eating often enough. Being more active than normal. If you don't have diabetes, you may still get low blood sugar if: There's a tumor in your pancreas. A tumor is a growth of cells that isn't normal. You don't eat enough, or you fast. Fasting is when you don't eat for long periods at a time. You have a bad infection or illness. You have problems after weight loss surgery. You have kidney or liver problems. You  take certain medicines. What increases the risk? You're more likely to have low blood sugar if: You have diabetes and take medicine for it. You drink a lot of alcohol . You get sick. What are the signs or symptoms? Mild Hunger or feeling like you may vomit. Sweating and feeling cold to the touch. Feeling dizzy or light-headed. Being sleepy or having trouble sleeping. A headache. Blurry vision. Mood changes. These include feeling worried, nervous, or easily annoyed. Moderate Feeling confused. Changes in the way you act. Weakness. An uneven heartbeat. Very bad Having very low blood sugar is an emergency. It can cause: Fainting. Seizures. A coma. Death. How is this diagnosed?  Low blood sugar can be found with a blood test. This test tells you how much sugar is in your blood. It's done while you're having symptoms. Your health care provider may also do an exam and look at your medical history. How is this treated? Treating low blood sugar If you have low blood sugar, eat or drink something with sugar in it right away. The food or drink should have 15 grams of a fast-acting carbohydrate (carb). Options include: 4 oz (120 mL) of fruit juice. 4 oz (120 mL) of soda (not diet soda). A few pieces of hard candy. Check food labels to see how many pieces to eat. 1 Tbsp (15 mL) of sugar or honey. 4 glucose tablets. 1 tube of glucose gel. Treating low blood sugar if you have diabetes Talk with your provider about how much carb you should take. If you're alert and can  swallow safely, you may follow the 15:15 rule: Take 15 grams of a fast-acting carb. Check your blood sugar 15 minutes after you take the carb. If your blood sugar is still at or below 70 mg/dL (3.9 mmol/L), take 15 grams of a carb again. If your blood sugar doesn't go above 70 mg/dL (3.9 mmol/L) after 3 tries, get help right away. After your blood sugar goes back to normal, eat a meal or a snack within 1 hour. Always keep  15 grams of a fast-acting carb with you. This could be: 4 glucose tablets. A few pieces of hard candy. 1 Tbsp (15 mL) of honey or sugar. 1 tube of glucose gel. Treating very low blood sugar If your blood sugar is less than 54 mg/dL (3 mmol/L), it's an emergency. Get help right away. If you can't eat or drink, you will need to be given glucagon. A family member or friend should learn how to check your blood sugar and give you glucagon. Ask your provider if you should keep a glucagon kit at home. You may also need to be treated in a hospital. Follow these instructions at home: If you have diabetes: Always keep a fast-acting carb (15 grams) with you. Follow your diabetes care plan. Make sure you: Know the symptoms of low blood sugar. Check your blood sugar as often as told. Always check it before and after you exercise. Always check your blood sugar before you drive. Take your medicines as told. Eat on time. Do not skip meals. Share your diabetes care plan with: Your work or school. The people you live with. Wear an alert bracelet or carry a card that says you have diabetes. General instructions If you drink alcohol : Limit how much you have to: 0-1 drink a day if you're female. 0-2 drinks a day if you're female. Know how much alcohol  is in your drink. In the [place] one drink is one 12 oz bottle of beer (355 mL), one 5 oz glass of wine (148 mL), or one 1 oz glass of hard liquor (44 mL). Be sure to eat food when you drink alcohol . Be sure to check your blood sugar after you drink. Alcohol  may lead to low blood sugar later. Where to find more information American Diabetes Association (ADA): diabetes.org Contact a health care provider if: You have low blood sugar often. You have diabetes and are having trouble keeping your blood sugar in the right range. Get help right away if: You can't get your blood sugar above 70 mg/dL (3.9 mmol/L) after 3 tries. Your blood sugar is below 54 mg/dL (3  mmol/L). You have a seizure. You faint. These symptoms may be an emergency. Call 911 right away. Do not wait to see if the symptoms will go away. Do not drive yourself to the hospital. This information is not intended to replace advice given to you by your health care provider. Make sure you discuss any questions you have with your health care provider. Document Revised: 06/25/2022 Document Reviewed: 06/25/2022 Elsevier Patient Education  2024 [employer].

## 2023-04-28 NOTE — Patient Outreach (Signed)
  Care Coordination   Follow Up Visit Note   04/29/2023 Name: Victoria Walker MRN: [id number] DOB: July 04, 1950  Victoria Walker is a 73 y.o. year old female who sees Domenica Harlene LABOR, MD for primary care. I spoke with  Victoria Walker by phone today.  What matters to the patients health and wellness today?  Patient reports she is doing well. She states she has fully recovered from knee surgery and is doing well. She continues to check blood sugar. Reports blood sugar down to 66 within the past month. Treated with eating/candy. She states she did not eat lunch that day. She states she has hypoglycemia awareness at [facility] <70 (gets sweaty, has decreased energy and difficulty thinking). She reports she keeps peppermint and lemonade around in case of hypoglycemia. Ms. Hlad has denied any low blood sugars when eating breakfast, lunch and dinner. Patient reports she has been reviewing educational material sent previously and denies any questions or concerns at this time.   Goals Addressed             This Visit's Progress    Education-diabetes       Interventions Today    Flowsheet Row Most Recent Value  Chronic Disease   Chronic disease during today's visit Diabetes, Hypertension (HTN)  General Interventions   General Interventions Discussed/Reviewed General Interventions Reviewed, Doctor Visits  [Evaluation of current treatment plan for health condition and patient's adherence to plan.]  Doctor Visits Discussed/Reviewed PCP, Specialist, Doctor Visits Reviewed  PCP/Specialist Visits Compliance with follow-up visit  [reviewed upcoming follow up appointments including PCP apointment scheduled for next month]  Exercise Interventions   Exercise Discussed/Reviewed Exercise Reviewed, Physical Activity  Physical Activity Discussed/Reviewed Physical Activity Reviewed  [assessed activity level and exercise-patient confirms she continue to ride the stationary bike 3x/week.]  Education  Interventions   Education Provided Provided Education  Provided Verbal Education On Nutrition, Mental Health/Coping with Illness, Exercise, Medication, Blood Sugar Monitoring, Labs, When to see the doctor  [discussed blood sugar, low blood sugar, treatment of low blood sugar, encouraged to continue to take medications as prescribed, attend provider visits as scheduled]  Labs Reviewed Hgb A1c  Nutrition Interventions   Nutrition Discussed/Reviewed Nutrition Reviewed  [discussed eating at least 3 meals/day including lunch]  Pharmacy Interventions   Pharmacy Dicussed/Reviewed Pharmacy Topics Reviewed  [medications reviewed]                SDOH assessments and interventions completed:  No  Care Coordination Interventions:  Yes, provided   Follow up plan: Follow up call scheduled for 06/18/23    Encounter Outcome:  Patient Visit Completed   Heddy Shutter, RN, MSN, BSN, CCM Care Management Coordinator [phone]

## 2023-05-04 ENCOUNTER — Ambulatory Visit (HOSPITAL_BASED_OUTPATIENT_CLINIC_OR_DEPARTMENT_OTHER)
Admission: RE | Admit: 2023-05-04 | Discharge: 2023-05-04 | Disposition: A | Discharge status: Home / Self Care | Payer: Medicare Other | Source: Ambulatory Visit | Attending: Urgent Care | Admitting: Urgent Care

## 2023-05-04 ENCOUNTER — Other Ambulatory Visit (HOSPITAL_BASED_OUTPATIENT_CLINIC_OR_DEPARTMENT_OTHER): Payer: Self-pay

## 2023-05-04 ENCOUNTER — Ambulatory Visit
Admission: EM | Admit: 2023-05-04 | Discharge: 2023-05-04 | Disposition: A | Discharge status: Home / Self Care | Payer: Medicare Other | Attending: Family Medicine | Admitting: Family Medicine

## 2023-05-04 DIAGNOSIS — B9789 Other viral agents as the cause of diseases classified elsewhere: Secondary | ICD-10-CM | POA: Insufficient documentation

## 2023-05-04 DIAGNOSIS — R0789 Other chest pain: Secondary | ICD-10-CM | POA: Insufficient documentation

## 2023-05-04 DIAGNOSIS — R059 Cough, unspecified: Secondary | ICD-10-CM | POA: Diagnosis not present

## 2023-05-04 DIAGNOSIS — R0989 Other specified symptoms and signs involving the circulatory and respiratory systems: Secondary | ICD-10-CM | POA: Diagnosis not present

## 2023-05-04 DIAGNOSIS — J988 Other specified respiratory disorders: Secondary | ICD-10-CM | POA: Diagnosis not present

## 2023-05-04 DIAGNOSIS — R079 Chest pain, unspecified: Secondary | ICD-10-CM | POA: Diagnosis not present

## 2023-05-04 MED ORDER — PROMETHAZINE-DM 6.25-15 MG/5ML PO SYRP
2.5000 mL | ORAL_SOLUTION | Freq: Three times a day (TID) | ORAL | 0 refills | Status: DC | PRN
  Filled 2023-05-04: qty 100, 14d supply, fill #0

## 2023-05-04 MED ORDER — ACETAMINOPHEN 325 MG PO TABS
650.0000 mg | ORAL_TABLET | Freq: Four times a day (QID) | ORAL | 0 refills | Status: AC | PRN
  Filled 2023-05-04: qty 100, 13d supply, fill #0

## 2023-05-04 MED ORDER — CETIRIZINE HCL 1 MG/ML PO SOLN
5.0000 mg | Freq: Every day | ORAL | 0 refills | Status: DC
Start: 2023-05-04 — End: 2023-09-21
  Filled 2023-05-04: qty 300, 60d supply, fill #0

## 2023-05-04 NOTE — ED Provider Notes (Signed)
 [facility]  Note:  This document was prepared using Conservation officer, historic buildings and may include unintentional dictation errors.  MRN: [id number] DOB: 13-Jan-1951  Subjective:   Victoria Walker is a 73 y.o. female presenting for 2-day history of sinus congestion, sinus drainage, throat pain, productive cough that elicits chest pain.  No fever, ear pain, shortness of breath, wheezing.  No history of asthma.  Has diabetes that is well-controlled.  Has a history of atrial fibrillation.  Patient cannot tolerate Paxlovid .  This was not on her medication allergies or intolerance list, I have added it. No smoking of any kind including cigarettes, cigars, vaping, marijuana use.    No current facility-administered medications for this encounter.  Current Outpatient Medications:    alendronate  (FOSAMAX ) 70 MG tablet, Take 1 tablet (70 mg total) by mouth once a week. Take with a full glass of water  on an empty stomach., Disp: 12 tablet, Rfl: 3   aspirin  81 MG EC tablet, Take 81 mg by mouth every other day. Swallow whole., Disp: , Rfl:    atorvastatin  (LIPITOR) 40 MG tablet, TAKE 1 TABLET BY MOUTH EVERY OTHER DAY, ALTERNATING WITH 1/2 TABLET, Disp: 180 tablet, Rfl: 1   AVAPRO  75 MG tablet, Take 1 tablet (75 mg total) by mouth daily., Disp: 90 tablet, Rfl: 1   Calcium  Carbonate-Vitamin D  (OS-CAL 500 + D PO), , Disp: , Rfl:    Calcium  Polycarbophil (FIBER) 625 MG TABS, Take 1 tablet by mouth daily., Disp: , Rfl:    Carboxymethylcellul-Glycerin (REFRESH OPTIVE OP), Apply to eye., Disp: , Rfl:    Cholecalciferol (VITAMIN D ) 2000 UNITS CAPS, Take 1 capsule by mouth every evening. , Disp: , Rfl:    Ciclopirox  1 % shampoo, Apply 1(one) application(s) topically 2(two) times a week, Disp: 120 mL, Rfl: 11   clindamycin  (CLEOCIN ) 150 MG capsule, Take 3 capsules (450 mg total) by mouth 1 hour prior to dental appt, Disp: 3 capsule, Rfl: 0   cycloSPORINE  (RESTASIS ) 0.05 % ophthalmic  emulsion, Place 1 drop into both eyes 2 (two) times daily., Disp: 180 each, Rfl: 3   diltiazem  (TIADYLT  ER) 360 MG 24 hr capsule, Take 1 capsule (360 mg total) by mouth daily., Disp: 90 capsule, Rfl: 1   Estradiol  10 MCG TABS vaginal tablet, Place 1 tablet (10 mcg total) vaginally every other day., Disp: 48 tablet, Rfl: 4   gabapentin  (NEURONTIN ) 300 MG capsule, Take 1 capsule (300 mg total) by mouth at bedtime., Disp: 90 capsule, Rfl: 1   glucose blood (ONE TOUCH TEST STRIPS) test strip, Use as directed twice daily to check blood sugar. DX code E11.9, Disp: 300 each, Rfl: 1   levocetirizine (XYZAL ) 5 MG tablet, Take 1 tablet (5 mg total) by mouth every evening., Disp: 90 tablet, Rfl: 3   levothyroxine  (SYNTHROID ) 88 MCG tablet, Take 1 tablet (88 mcg total) by mouth daily. **SKIP SUNDAY**, Disp: 90 tablet, Rfl: 0   loteprednol  (LOTEMAX ) 0.5 % ophthalmic suspension, Place 1 drop into both eyes 4 (four) times daily as needed with flare up, Disp: 5 mL, Rfl: 3   metFORMIN  (GLUCOPHAGE -XR) 750 MG 24 hr tablet, Take 1 tablet (750 mg total) by mouth in the morning and at bedtime., Disp: 180 tablet, Rfl: 0   metoprolol  succinate (TOPROL -XL) 25 MG 24 hr tablet, Take 1 tablet (25 mg total) by mouth 2 (two) times daily., Disp: 180 tablet, Rfl: 1   mometasone  (NASONEX ) 50 MCG/ACT nasal spray, Place 1-2 sprays  in each nostril daily as needed for stuffy nose., Disp: 51 g, Rfl: 3   NEXIUM  40 MG capsule, Take 1 capsule (40 mg total) by mouth daily., Disp: 90 capsule, Rfl: 1   Probiotic Product (PROBIOTIC DAILY PO), Take 1 capsule by mouth daily., Disp: , Rfl:    spironolactone  (ALDACTONE ) 25 MG tablet, Take 1.5 tablets (37.5 mg total) by mouth daily., Disp: 135 tablet, Rfl: 1   Allergies  Allergen Reactions   Codeine Anxiety    Jittery    Penicillins Itching   Sulfa Antibiotics Itching   Vicodin [Hydrocodone -Acetaminophen ] Itching    ITCHING     Past Medical History:  Diagnosis Date   Arthritis    both  knees (07/02/2015)   Cancer of right breast (HCC)    Claustrophobia    Dysrhythmia    HX OF FAST HEART RATE AND PALPITATIONS - METOPROLOL  HAS HELPED   GERD (gastroesophageal reflux disease)    H/O hiatal hernia    H/O iritis    LEFT EYE - STATES HER EYE BECOMES RED AND VERY SENSITIVE TO LIGHT WHEN FLARE UP OF IRITIS   Heart murmur    benign; 06/2015   High cholesterol    History of blood transfusion    when I had my partial hysterectomy   History of chickenpox 09/26/2014   History of measles    History of MRSA infection    History of shingles    Hypertension    Hypothyroidism    Medicare annual wellness visit, initial 04/07/2015   Multinodular thyroid     PT HAVING TROUBLE SWALLOWING   Neuropathy    FEET   Primary localized osteoarthritis of right knee    Sciatica of right side 04/25/2015   Spinal headache    spinal headache with epidural   Total knee replacement status, right    Type II diabetes mellitus (HCC)    ORAL MEDICATION - NO INSULIN    Wears glasses      Past Surgical History:  Procedure Laterality Date   ABDOMINAL HYSTERECTOMY  1988   ABDOMINOPLASTY  1986   ANTERIOR AND POSTERIOR REPAIR  05/11/2012   Procedure: ANTERIOR (CYSTOCELE) AND POSTERIOR REPAIR (RECTOCELE);  Surgeon: Glendia DELENA Elizabeth, MD;  Location: [facility];  Service: Urology;;  with graft   BREAST BIOPSY Right    CARDIAC CATHETERIZATION  1999   COLONOSCOPY     CYSTOSCOPY WITH URETHRAL DILATATION  05/11/2012   Procedure: CYSTOSCOPY WITH URETHRAL DILATATION;  Surgeon: Glendia DELENA Elizabeth, MD;  Location: [facility];  Service: Urology;;   ESOPHAGEAL MANOMETRY N/A 10/09/2021   Procedure: ESOPHAGEAL MANOMETRY (EM);  Surgeon: Wilhelmenia Aloha Raddle., MD;  Location: [facility];  Service: Gastroenterology;  Laterality: N/A;   ESOPHAGOGASTRODUODENOSCOPY     HERNIA REPAIR     MASTECTOMY Right 2007   PLANTAR FASCIA SURGERY Left    THYROIDECTOMY N/A 02/23/2014   Procedure: TOTAL THYROIDECTOMY;  Surgeon: Krystal Spinner,  MD;  Location: [facility];  Service: General;  Laterality: N/A;   TOTAL KNEE ARTHROPLASTY Right 07/02/2015   TOTAL KNEE ARTHROPLASTY Right 07/02/2015   Procedure: TOTAL KNEE ARTHROPLASTY;  Surgeon: Lamar Millman, MD;  Location: [facility] OR;  Service: Orthopedics;  Laterality: Right;   TOTAL THYROIDECTOMY  1976   removed three tumor (2 on right; 1 on left)   TUBAL LIGATION     UMBILICAL HERNIA REPAIR  1986   VAGINAL PROLAPSE REPAIR  05/11/2012   Procedure: VAGINAL VAULT SUSPENSION;  Surgeon: Glendia DELENA Elizabeth, MD;  Location: [facility];  Service: Urology;;   VULVA / PERINEUM BIOPSY     vulvar    Family History  Problem Relation Age of Onset   Hypertension Maternal Grandmother    Heart disease Maternal Grandmother    Stroke Maternal Grandmother    Hypertension Maternal Grandfather    Hypertension Father    Diabetes Father    Alcohol  abuse Father    Lung cancer Father        lung, psa   Hypertension Mother    Heart disease Mother        pacer, CHF   Hyperlipidemia Mother    Kidney disease Mother        1 kidney due to stone   Kidney Stones Mother    Diabetes Brother    Hypertension Brother    Hyperlipidemia Brother    Kidney disease Brother        dialysis   Stroke Brother    Hypertension Brother    Hyperlipidemia Brother    Hypertension Brother    Hyperlipidemia Brother    Benign prostatic hyperplasia Brother    Diabetes Sister    Hypertension Sister    Breast cancer Sister    Hyperlipidemia Sister    Hypertension Sister    Diabetes Sister    Thyroid  disease Sister    Kidney disease Sister    Endometriosis Sister    Breast cancer Sister        thyroid  and breast   Thyroid  cancer Sister    Arthritis Sister        back pain   Hypertension Sister    Thyroid  disease Sister    Hypertension Daughter    Hypertension Daughter    Hyperlipidemia Daughter    Diabetes Daughter    Arthritis Son    Hypertension Maternal Aunt    Hypertension Maternal Uncle    Hypertension Paternal  Aunt    Cancer Paternal Aunt    Hypertension Paternal Uncle    Colon cancer Neg Hx    Stomach cancer Neg Hx    Esophageal cancer Neg Hx    Pancreatic cancer Neg Hx    Liver disease Neg Hx    Rectal cancer Neg Hx    Inflammatory bowel disease Neg Hx     Social History   Tobacco Use   Smoking status: Never   Smokeless tobacco: Never  Vaping Use   Vaping status: Never Used  Substance Use Topics   Alcohol  use: Not Currently   Drug use: Not Currently    ROS   Objective:   Vitals: BP 122/79 (BP Location: Left Arm)   Pulse 81   Temp 98.6 F (37 C) (Oral)   Resp 20   SpO2 98%   Physical Exam Constitutional:      General: She is not in acute distress.    Appearance: Normal appearance. She is well-developed and normal weight. She is not ill-appearing, toxic-appearing or diaphoretic.  HENT:     Head: Normocephalic and atraumatic.     Right Ear: Tympanic membrane, ear canal and external ear normal. No drainage or tenderness. No middle ear effusion. There is no impacted cerumen. Tympanic membrane is not erythematous or bulging.     Left Ear: Tympanic membrane, ear canal and external ear normal. No drainage or tenderness.  No middle ear effusion. There is no impacted cerumen. Tympanic membrane is not erythematous or bulging.     Nose: Congestion present. No rhinorrhea.     Mouth/Throat:     Mouth: Mucous  membranes are moist. No oral lesions.     Pharynx: No pharyngeal swelling, oropharyngeal exudate, posterior oropharyngeal erythema or uvula swelling.     Tonsils: No tonsillar exudate or tonsillar abscesses.     Comments: Thick streaks of postnasal drainage overlying pharynx. Eyes:     General: No scleral icterus.       Right eye: No discharge.        Left eye: No discharge.     Extraocular Movements: Extraocular movements intact.     Right eye: Normal extraocular motion.     Left eye: Normal extraocular motion.     Conjunctiva/sclera: Conjunctivae normal.   Cardiovascular:     Rate and Rhythm: Normal rate and regular rhythm.     Heart sounds: Normal heart sounds. No murmur heard.    No friction rub. No gallop.  Pulmonary:     Effort: Pulmonary effort is normal. No respiratory distress.     Breath sounds: No stridor. No wheezing, rhonchi or rales.  Chest:     Chest wall: No tenderness.  Musculoskeletal:     Cervical back: Normal range of motion and neck supple.  Lymphadenopathy:     Cervical: No cervical adenopathy.  Skin:    General: Skin is warm and dry.  Neurological:     General: No focal deficit present.     Mental Status: She is alert and oriented to person, place, and time.  Psychiatric:        Mood and Affect: Mood normal.        Behavior: Behavior normal.     Assessment and Plan :   PDMP not reviewed this encounter.  1. Viral respiratory infection   2. Atypical chest pain    Will pursue outpatient imaging since I do not have a radiology technologist onsite, x-ray order placed. Will manage for viral illness such as viral URI, viral syndrome, viral rhinitis, COVID-19. Recommended supportive care. Offered scripts for symptomatic relief. Testing is pending. Counseled patient on potential for adverse effects with medications prescribed/recommended today, ER and return-to-clinic precautions discussed, patient verbalized understanding.     Christopher Savannah, PA-C 05/04/23 1052

## 2023-05-04 NOTE — ED Triage Notes (Signed)
 Pt c/o prod cough, head/chest congestion, sore throat day 2-denies known fever-NAD-steady gait

## 2023-05-04 NOTE — Discharge Instructions (Signed)
 I have placed orders to have an x-ray done at the med center in [facility].  Please had there now.  Go through the main hospital and not the emergency room.  Once you are there and let them know that you will came to our clinic and we send she to their facility for an outpatient x-ray.  If no one is at the front desk then they are likely out the rest of the day and at that point you would have to go through the emergency room.  Do not check in as a patient through the emergency room.  Simply let them know that you are there for an outpatient x-ray from our clinic.  I will call you with your results and update our treatment plan if necessary after I get the report.   We will notify you of your test results as they arrive and may take between about 24 hours.  I encourage you to sign up for MyChart if you have not already done so as this can be the easiest way for us  to communicate results to you online or through a phone app.  Generally, we only contact you if it is a positive test result.  In the meantime, if you develop worsening symptoms including fever, chest pain, shortness of breath despite our current treatment plan then please report to the emergency room as this may be a sign of worsening status from possible viral infection.  Otherwise, we will manage this as a viral syndrome. For sore throat or cough try using a honey-based tea. Use 3 teaspoons of honey with juice squeezed from half lemon. Place shaved pieces of ginger into 1/2-1 cup of water  and warm over stove top. Then mix the ingredients and repeat every 4 hours as needed. Please take Tylenol  500mg -650mg  every 6 hours for aches and pains, fevers. Hydrate very well with at least 2 liters of water . Eat light meals such as soups to replenish electrolytes and soft fruits, veggies. Start an antihistamine like Zyrtec  (10mg  daily) for postnasal drainage, sinus congestion.  You can take this together with cough medications as needed.

## 2023-05-05 LAB — SARS CORONAVIRUS 2 (TAT 6-24 HRS): SARS Coronavirus 2: NEGATIVE

## 2023-05-14 ENCOUNTER — Other Ambulatory Visit (HOSPITAL_BASED_OUTPATIENT_CLINIC_OR_DEPARTMENT_OTHER): Payer: Self-pay

## 2023-05-14 ENCOUNTER — Other Ambulatory Visit: Payer: Self-pay

## 2023-05-14 DIAGNOSIS — B3731 Acute candidiasis of vulva and vagina: Secondary | ICD-10-CM

## 2023-05-14 MED ORDER — FLUCONAZOLE 150 MG PO TABS
150.0000 mg | ORAL_TABLET | Freq: Once | ORAL | 0 refills | Status: AC
  Filled 2023-05-14: qty 1, 1d supply, fill #0

## 2023-05-15 ENCOUNTER — Ambulatory Visit (INDEPENDENT_AMBULATORY_CARE_PROVIDER_SITE_OTHER): Payer: Medicare Other

## 2023-05-15 DIAGNOSIS — E538 Deficiency of other specified B group vitamins: Secondary | ICD-10-CM

## 2023-05-15 MED ORDER — CYANOCOBALAMIN 1000 MCG/ML IJ SOLN
1000.0000 ug | Freq: Once | INTRAMUSCULAR | Status: AC
  Administered 2023-05-15: 1000 ug via INTRAMUSCULAR

## 2023-05-15 NOTE — Progress Notes (Signed)
Victoria Walker is a 73 y.o. female  female presents to the office today for 2nd Monthly B12 injection, per physician's orders. Original order: . "04/16/23: B12 is starting to improve. Continue with monthly injections and regular PCP follow-up (scheduled for February)" Cyanocobalamin 1000 mg/ml IM was administered L Deltoid today. Patient tolerated injection. Patient due for follow up labs/provider appt: labs done today.  Patient next injection due: 1 month, appt made: Pt will have next injection at [facility] with Dr Abner Greenspan on 06/15/23.  DOD: Audie Box, Feliberto Harts

## 2023-05-18 ENCOUNTER — Other Ambulatory Visit: Payer: Self-pay

## 2023-05-18 ENCOUNTER — Other Ambulatory Visit (HOSPITAL_BASED_OUTPATIENT_CLINIC_OR_DEPARTMENT_OTHER): Payer: Self-pay

## 2023-05-19 ENCOUNTER — Encounter (HOSPITAL_BASED_OUTPATIENT_CLINIC_OR_DEPARTMENT_OTHER): Payer: Self-pay

## 2023-05-19 ENCOUNTER — Other Ambulatory Visit (HOSPITAL_BASED_OUTPATIENT_CLINIC_OR_DEPARTMENT_OTHER): Payer: Self-pay

## 2023-05-20 ENCOUNTER — Other Ambulatory Visit (HOSPITAL_BASED_OUTPATIENT_CLINIC_OR_DEPARTMENT_OTHER): Payer: Self-pay

## 2023-05-20 ENCOUNTER — Telehealth: Payer: Self-pay | Admitting: Family Medicine

## 2023-05-20 ENCOUNTER — Other Ambulatory Visit: Payer: Self-pay | Admitting: Family Medicine

## 2023-05-20 ENCOUNTER — Other Ambulatory Visit: Payer: Self-pay | Admitting: Family

## 2023-05-20 MED ORDER — AVAPRO 75 MG PO TABS
75.0000 mg | ORAL_TABLET | Freq: Every day | ORAL | 1 refills | Status: DC
  Filled 2023-05-20: qty 90, 90d supply, fill #0

## 2023-05-20 NOTE — Telephone Encounter (Signed)
Please advise

## 2023-05-20 NOTE — Telephone Encounter (Signed)
Patient is calling in stating that the medication AVAPRO 75 MG tablet  has been discontinued does she needs a generic brand are a new medication she needs to know and stated that the prescription can be sent  to her pharmacy

## 2023-05-21 ENCOUNTER — Other Ambulatory Visit (HOSPITAL_BASED_OUTPATIENT_CLINIC_OR_DEPARTMENT_OTHER): Payer: Self-pay

## 2023-05-22 ENCOUNTER — Other Ambulatory Visit: Payer: Self-pay

## 2023-05-27 ENCOUNTER — Other Ambulatory Visit (HOSPITAL_COMMUNITY)
Admission: RE | Admit: 2023-05-27 | Discharge: 2023-05-27 | Disposition: A | Discharge status: Home / Self Care | Payer: Medicare Other | Source: Ambulatory Visit | Attending: Obstetrics & Gynecology | Admitting: Obstetrics & Gynecology

## 2023-05-27 ENCOUNTER — Ambulatory Visit: Payer: Medicare Other

## 2023-05-27 ENCOUNTER — Telehealth: Payer: Self-pay

## 2023-05-27 VITALS — BP 105/54 | HR 69 | Wt 156.0 lb

## 2023-05-27 DIAGNOSIS — R109 Unspecified abdominal pain: Secondary | ICD-10-CM

## 2023-05-27 DIAGNOSIS — N898 Other specified noninflammatory disorders of vagina: Secondary | ICD-10-CM

## 2023-05-27 LAB — POCT URINALYSIS DIPSTICK
Bilirubin, UA: NEGATIVE
Blood, UA: NEGATIVE
Glucose, UA: NEGATIVE
Ketones, UA: NEGATIVE
Leukocytes, UA: NEGATIVE
Nitrite, UA: NEGATIVE
Protein, UA: NEGATIVE
Spec Grav, UA: 1.015 (ref 1.010–1.025)
Urobilinogen, UA: 0.2 U/dL
pH, UA: 5 (ref 5.0–8.0)

## 2023-05-27 NOTE — Telephone Encounter (Signed)
 Copied from CRM [id number]. Topic: Clinical - Prescription Issue >> May 27, 2023  1:54 PM Evie B wrote: Reason for CRM: Pt called to advise AVAPRO  75 MG tablet  has been discontinued. Pharmacy would like the provider to  order the patient another medication or the generic version of the medication. Please call pt back . [phone]

## 2023-05-27 NOTE — Progress Notes (Signed)
 SUBJECTIVE:  73 y.o. female complains of white vaginal discharge for 1 week(s). Denies abnormal vaginal bleeding or significant pelvic pain or fever. No UTI symptoms. Denies history of known exposure to STD.  No LMP recorded. Patient has had a hysterectomy.  OBJECTIVE:  She appears well, afebrile. Urine dipstick: negative for all components.  ASSESSMENT:  Vaginal Discharge  Vaginal itching  Back pain  Abdominal pain    PLAN:  BVAG, CVAG probe sent to lab. Urine culture was sent p Treatment: To be determined once lab results are received ROV prn if symptoms persist or worsen.

## 2023-05-28 ENCOUNTER — Other Ambulatory Visit: Payer: Self-pay | Admitting: Family

## 2023-05-28 ENCOUNTER — Other Ambulatory Visit (HOSPITAL_BASED_OUTPATIENT_CLINIC_OR_DEPARTMENT_OTHER): Payer: Self-pay

## 2023-05-28 ENCOUNTER — Other Ambulatory Visit: Payer: Self-pay

## 2023-05-28 MED ORDER — IRBESARTAN 75 MG PO TABS
75.0000 mg | ORAL_TABLET | Freq: Every day | ORAL | 3 refills | Status: DC
  Filled 2023-05-28: qty 30, 30d supply, fill #0
  Filled 2023-06-12 – 2023-06-22 (×2): qty 30, 30d supply, fill #1

## 2023-05-28 NOTE — Telephone Encounter (Signed)
 Called patient. No answer. LVM

## 2023-05-29 LAB — CERVICOVAGINAL ANCILLARY ONLY
Bacterial Vaginitis (gardnerella): NEGATIVE
Candida Glabrata: NEGATIVE
Candida Vaginitis: NEGATIVE
Comment: NEGATIVE
Comment: NEGATIVE
Comment: NEGATIVE

## 2023-06-01 ENCOUNTER — Telehealth: Payer: Self-pay

## 2023-06-01 NOTE — Telephone Encounter (Signed)
 Called patient to inform her that all of her labs from 05/27/23 are WNL. Understanding was voiced. Lang Zingg l Demonta Wombles, CMA

## 2023-06-01 NOTE — Telephone Encounter (Signed)
-----   Message from [facility] sent at 06/01/2023 12:24 PM EST ----- Please call pt and let her know that all of her labs from 2/5 are WNL.   Thx  Clh-S

## 2023-06-02 LAB — URINE CULTURE

## 2023-06-03 ENCOUNTER — Other Ambulatory Visit (HOSPITAL_BASED_OUTPATIENT_CLINIC_OR_DEPARTMENT_OTHER): Payer: Self-pay

## 2023-06-03 ENCOUNTER — Other Ambulatory Visit: Payer: Self-pay

## 2023-06-03 ENCOUNTER — Telehealth: Payer: Self-pay

## 2023-06-03 DIAGNOSIS — B379 Candidiasis, unspecified: Secondary | ICD-10-CM

## 2023-06-03 DIAGNOSIS — N390 Urinary tract infection, site not specified: Secondary | ICD-10-CM

## 2023-06-03 MED ORDER — NITROFURANTOIN MONOHYD MACRO 100 MG PO CAPS
100.0000 mg | ORAL_CAPSULE | Freq: Two times a day (BID) | ORAL | 0 refills | Status: AC
  Filled 2023-06-03: qty 10, 5d supply, fill #0

## 2023-06-03 MED ORDER — FLUCONAZOLE 150 MG PO TABS
150.0000 mg | ORAL_TABLET | Freq: Once | ORAL | 0 refills | Status: AC
  Filled 2023-06-03: qty 1, 1d supply, fill #0

## 2023-06-03 NOTE — Telephone Encounter (Signed)
Patient notified of urine culture results and Rx to be sent to pharmacy. Marland Kitchenme

## 2023-06-12 ENCOUNTER — Other Ambulatory Visit: Payer: Self-pay | Admitting: Family Medicine

## 2023-06-12 ENCOUNTER — Other Ambulatory Visit (HOSPITAL_BASED_OUTPATIENT_CLINIC_OR_DEPARTMENT_OTHER): Payer: Self-pay

## 2023-06-12 ENCOUNTER — Other Ambulatory Visit: Payer: Self-pay

## 2023-06-12 MED ORDER — LEVOTHYROXINE SODIUM 88 MCG PO TABS
88.0000 ug | ORAL_TABLET | Freq: Every day | ORAL | 0 refills | Status: DC
  Filled 2023-06-12: qty 90, 90d supply, fill #0

## 2023-06-12 MED ORDER — METFORMIN HCL ER 750 MG PO TB24
750.0000 mg | ORAL_TABLET | Freq: Two times a day (BID) | ORAL | 0 refills | Status: DC
  Filled 2023-06-12: qty 180, 90d supply, fill #0

## 2023-06-14 NOTE — Assessment & Plan Note (Signed)
 On Levothyroxine, continue to monitor

## 2023-06-14 NOTE — Assessment & Plan Note (Signed)
 Encouraged to get adequate exercise, calcium and vitamin d intake

## 2023-06-14 NOTE — Assessment & Plan Note (Signed)
 Control glucose levels, stay active and monitor.

## 2023-06-14 NOTE — Assessment & Plan Note (Signed)
 Tolerating statin, encouraged heart healthy diet, avoid trans fats, minimize simple carbs and saturated fats. Increase exercise as tolerated

## 2023-06-14 NOTE — Assessment & Plan Note (Signed)
 [id number] acceptable, minimize simple carbs. Increase exercise as tolerated. Continue current meds

## 2023-06-14 NOTE — Assessment & Plan Note (Signed)
 Well controlled, no changes to meds. Encouraged heart healthy diet such as the DASH diet and exercise as tolerated.

## 2023-06-15 ENCOUNTER — Encounter: Payer: Self-pay | Admitting: Family Medicine

## 2023-06-15 ENCOUNTER — Other Ambulatory Visit (HOSPITAL_BASED_OUTPATIENT_CLINIC_OR_DEPARTMENT_OTHER): Payer: Self-pay

## 2023-06-15 ENCOUNTER — Ambulatory Visit (INDEPENDENT_AMBULATORY_CARE_PROVIDER_SITE_OTHER): Payer: Medicare Other | Admitting: Family Medicine

## 2023-06-15 VITALS — BP 121/69 | HR 68 | Temp 98.0°F | Resp 16 | Ht 64.0 in | Wt 155.4 lb

## 2023-06-15 DIAGNOSIS — Z7984 Long term (current) use of oral hypoglycemic drugs: Secondary | ICD-10-CM

## 2023-06-15 DIAGNOSIS — E039 Hypothyroidism, unspecified: Secondary | ICD-10-CM | POA: Diagnosis not present

## 2023-06-15 DIAGNOSIS — E782 Mixed hyperlipidemia: Secondary | ICD-10-CM | POA: Diagnosis not present

## 2023-06-15 DIAGNOSIS — I10 Essential (primary) hypertension: Secondary | ICD-10-CM | POA: Diagnosis not present

## 2023-06-15 DIAGNOSIS — E1140 Type 2 diabetes mellitus with diabetic neuropathy, unspecified: Secondary | ICD-10-CM

## 2023-06-15 DIAGNOSIS — E538 Deficiency of other specified B group vitamins: Secondary | ICD-10-CM | POA: Diagnosis not present

## 2023-06-15 DIAGNOSIS — E0842 Diabetes mellitus due to underlying condition with diabetic polyneuropathy: Secondary | ICD-10-CM

## 2023-06-15 DIAGNOSIS — Z96653 Presence of artificial knee joint, bilateral: Secondary | ICD-10-CM

## 2023-06-15 DIAGNOSIS — M810 Age-related osteoporosis without current pathological fracture: Secondary | ICD-10-CM | POA: Diagnosis not present

## 2023-06-15 LAB — CBC WITH DIFFERENTIAL/PLATELET
Basophils Absolute: 0 10*3/uL (ref 0.0–0.1)
Basophils Relative: 0.6 % (ref 0.0–3.0)
Eosinophils Absolute: 0 10*3/uL (ref 0.0–0.7)
Eosinophils Relative: 0.7 % (ref 0.0–5.0)
HCT: 40.2 % (ref 36.0–46.0)
Hemoglobin: 13 g/dL (ref 12.0–15.0)
Lymphocytes Relative: 36.9 % (ref 12.0–46.0)
Lymphs Abs: 2.3 10*3/uL (ref 0.7–4.0)
MCHC: 32.4 g/dL (ref 30.0–36.0)
MCV: 98.5 fL (ref 78.0–100.0)
Monocytes Absolute: 0.5 10*3/uL (ref 0.1–1.0)
Monocytes Relative: 8.3 % (ref 3.0–12.0)
Neutro Abs: 3.3 10*3/uL (ref 1.4–7.7)
Neutrophils Relative %: 53.5 % (ref 43.0–77.0)
Platelets: 296 10*3/uL (ref 150.0–400.0)
RBC: 4.08 Mil/uL (ref 3.87–5.11)
RDW: 14.5 % (ref 11.5–15.5)
WBC: 6.2 10*3/uL (ref 4.0–10.5)

## 2023-06-15 LAB — HEMOGLOBIN A1C: Hgb A1c MFr Bld: 6.8 % — ABNORMAL HIGH (ref 4.6–6.5)

## 2023-06-15 MED ORDER — CYANOCOBALAMIN 1000 MCG/ML IJ SOLN
1000.0000 ug | Freq: Once | INTRAMUSCULAR | Status: AC
  Administered 2023-06-15: 1000 ug via INTRAMUSCULAR

## 2023-06-15 NOTE — Assessment & Plan Note (Signed)
 Doing well still some mild swelling in left knee but able to walk

## 2023-06-15 NOTE — Patient Instructions (Signed)
 Hypertension, Adult High blood pressure (hypertension) is when the force of blood pumping through the arteries is too strong. The arteries are the blood vessels that carry blood from the heart throughout the body. Hypertension forces the heart to work harder to pump blood and may cause arteries to become narrow or stiff. Untreated or uncontrolled hypertension can lead to a heart attack, heart failure, a stroke, kidney disease, and other problems. A blood pressure reading consists of a higher number over a lower number. Ideally, your blood pressure should be below 120/80. The first ("top") number is called the systolic pressure. It is a measure of the pressure in your arteries as your heart beats. The second ("bottom") number is called the diastolic pressure. It is a measure of the pressure in your arteries as the heart relaxes. What are the causes? The exact cause of this condition is not known. There are some conditions that result in high blood pressure. What increases the risk? Certain factors may make you more likely to develop high blood pressure. Some of these risk factors are under your control, including: Smoking. Not getting enough exercise or physical activity. Being overweight. Having too much fat, sugar, calories, or salt (sodium) in your diet. Drinking too much alcohol. Other risk factors include: Having a personal history of heart disease, diabetes, high cholesterol, or kidney disease. Stress. Having a family history of high blood pressure and high cholesterol. Having obstructive sleep apnea. Age. The risk increases with age. What are the signs or symptoms? High blood pressure may not cause symptoms. Very high blood pressure (hypertensive crisis) may cause: Headache. Fast or irregular heartbeats (palpitations). Shortness of breath. Nosebleed. Nausea and vomiting. Vision changes. Severe chest pain, dizziness, and seizures. How is this diagnosed? This condition is diagnosed by  measuring your blood pressure while you are seated, with your arm resting on a flat surface, your legs uncrossed, and your feet flat on the floor. The cuff of the blood pressure monitor will be placed directly against the skin of your upper arm at the level of your heart. Blood pressure should be measured at least twice using the same arm. Certain conditions can cause a difference in blood pressure between your right and left arms. If you have a high blood pressure reading during one visit or you have normal blood pressure with other risk factors, you may be asked to: Return on a different day to have your blood pressure checked again. Monitor your blood pressure at home for 1 week or longer. If you are diagnosed with hypertension, you may have other blood or imaging tests to help your health care provider understand your overall risk for other conditions. How is this treated? This condition is treated by making healthy lifestyle changes, such as eating healthy foods, exercising more, and reducing your alcohol intake. You may be referred for counseling on a healthy diet and physical activity. Your health care provider may prescribe medicine if lifestyle changes are not enough to get your blood pressure under control and if: Your systolic blood pressure is above 130. Your diastolic blood pressure is above 80. Your personal target blood pressure may vary depending on your medical conditions, your age, and other factors. Follow these instructions at home: Eating and drinking  Eat a diet that is high in fiber and potassium, and low in sodium, added sugar, and fat. An example of this eating plan is called the DASH diet. DASH stands for Dietary Approaches to Stop Hypertension. To eat this way: Eat  plenty of fresh fruits and vegetables. Try to fill one half of your plate at each meal with fruits and vegetables. Eat whole grains, such as whole-wheat pasta, brown rice, or whole-grain bread. Fill about one  fourth of your plate with whole grains. Eat or drink low-fat dairy products, such as skim milk or low-fat yogurt. Avoid fatty cuts of meat, processed or cured meats, and poultry with skin. Fill about one fourth of your plate with lean proteins, such as fish, chicken without skin, beans, eggs, or tofu. Avoid pre-made and processed foods. These tend to be higher in sodium, added sugar, and fat. Reduce your daily sodium intake. Many people with hypertension should eat less than 1,500 mg of sodium a day. Do not drink alcohol if: Your health care provider tells you not to drink. You are pregnant, may be pregnant, or are planning to become pregnant. If you drink alcohol: Limit how much you have to: 0-1 drink a day for women. 0-2 drinks a day for men. Know how much alcohol is in your drink. In the [place] one drink equals one 12 oz bottle of beer (355 mL), one 5 oz glass of wine (148 mL), or one 1 oz glass of hard liquor (44 mL). Lifestyle  Work with your health care provider to maintain a healthy body weight or to lose weight. Ask what an ideal weight is for you. Get at least 30 minutes of exercise that causes your heart to beat faster (aerobic exercise) most days of the week. Activities may include walking, swimming, or biking. Include exercise to strengthen your muscles (resistance exercise), such as Pilates or lifting weights, as part of your weekly exercise routine. Try to do these types of exercises for 30 minutes at least 3 days a week. Do not use any products that contain nicotine or tobacco. These products include cigarettes, chewing tobacco, and vaping devices, such as e-cigarettes. If you need help quitting, ask your health care provider. Monitor your blood pressure at home as told by your health care provider. Keep all follow-up visits. This is important. Medicines Take over-the-counter and prescription medicines only as told by your health care provider. Follow directions carefully. Blood  pressure medicines must be taken as prescribed. Do not skip doses of blood pressure medicine. Doing this puts you at risk for problems and can make the medicine less effective. Ask your health care provider about side effects or reactions to medicines that you should watch for. Contact a health care provider if you: Think you are having a reaction to a medicine you are taking. Have headaches that keep coming back (recurring). Feel dizzy. Have swelling in your ankles. Have trouble with your vision. Get help right away if you: Develop a severe headache or confusion. Have unusual weakness or numbness. Feel faint. Have severe pain in your chest or abdomen. Vomit repeatedly. Have trouble breathing. These symptoms may be an emergency. Get help right away. Call 911. Do not wait to see if the symptoms will go away. Do not drive yourself to the hospital. Summary Hypertension is when the force of blood pumping through your arteries is too strong. If this condition is not controlled, it may put you at risk for serious complications. Your personal target blood pressure may vary depending on your medical conditions, your age, and other factors. For most people, a normal blood pressure is less than 120/80. Hypertension is treated with lifestyle changes, medicines, or a combination of both. Lifestyle changes include losing weight, eating a healthy,  low-sodium diet, exercising more, and limiting alcohol. This information is not intended to replace advice given to you by your health care provider. Make sure you discuss any questions you have with your health care provider. Document Revised: 02/12/2021 Document Reviewed: 02/12/2021 Elsevier Patient Education  2024 [employer].

## 2023-06-16 ENCOUNTER — Encounter: Payer: Self-pay | Admitting: Family Medicine

## 2023-06-16 LAB — MICROALBUMIN / CREATININE URINE RATIO
Creatinine,U: 91.6 mg/dL
Microalb Creat Ratio: 7.6 mg/g (ref 0.0–30.0)
Microalb, Ur: <0.7 mg/dL (ref 0.0–1.9)

## 2023-06-16 NOTE — Progress Notes (Signed)
 Subjective:    Patient ID: Victoria Walker, female    DOB: [birth year], 73 y.o.   MRN: [id number]  Chief Complaint  Patient presents with   Follow-up    HPI Discussed the use of AI scribe software for clinical note transcription with the patient, who gave verbal consent to proceed.  History of Present Illness Victoria Walker is a 73 year old female who presents for follow-up on her knee surgery and B12 management. She is accompanied by her husband, who is also receiving medical care.  She is here for follow-up after her knee surgery, which occurred less than a year ago. Recovery has been slower than expected, with her knee swelling with use. However, she is able to walk, and the pain has improved. She has completed rehabilitation and is satisfied with her progress.  She is managing her B12 deficiency with monthly injections. Her intrinsic factor was tested last year and was negative.  Her social history includes being active in caring for her husband, who has had cataract surgeries and is preparing for a laminectomy. She maintains her physical activity by taking care of him and receives some assistance from the [place] for home health and respiratory care. Her children also help her occasionally. She is trying to maintain a healthy lifestyle by eating well, drinking water, and getting rest.    Past Medical History:  Diagnosis Date   Arthritis    "both knees" (07/02/2015)   Cancer of right breast (HCC)    Claustrophobia    Dysrhythmia    HX OF FAST HEART RATE AND PALPITATIONS - METOPROLOL HAS HELPED   GERD (gastroesophageal reflux disease)    H/O hiatal hernia    H/O iritis    LEFT EYE - STATES HER EYE BECOMES RED AND VERY SENSITIVE TO LIGHT WHEN FLARE UP OF IRITIS   Heart murmur    "benign; 06/2015"   High cholesterol    History of blood transfusion    "when I had my partial hysterectomy"   History of chickenpox 09/26/2014   History of measles    History of MRSA infection     History of shingles    Hypertension    Hypothyroidism    Medicare annual wellness visit, initial 04/07/2015   Multinodular thyroid    PT HAVING TROUBLE SWALLOWING   Neuropathy    FEET   Primary localized osteoarthritis of right knee    Sciatica of right side 04/25/2015   Spinal headache    spinal headache with epidural   Total knee replacement status, right    Type II diabetes mellitus (HCC)    ORAL MEDICATION - NO INSULIN   Wears glasses     Past Surgical History:  Procedure Laterality Date   ABDOMINAL HYSTERECTOMY  1988   ABDOMINOPLASTY  1986   ANTERIOR AND POSTERIOR REPAIR  05/11/2012   Procedure: ANTERIOR (CYSTOCELE) AND POSTERIOR REPAIR (RECTOCELE);  Surgeon: Martina Sinner, MD;  Location: [facility];  Service: Urology;;  with graft   BREAST BIOPSY Right    CARDIAC CATHETERIZATION  1999   COLONOSCOPY     CYSTOSCOPY WITH URETHRAL DILATATION  05/11/2012   Procedure: CYSTOSCOPY WITH URETHRAL DILATATION;  Surgeon: Martina Sinner, MD;  Location: [facility];  Service: Urology;;   ESOPHAGEAL MANOMETRY N/A 10/09/2021   Procedure: ESOPHAGEAL MANOMETRY (EM);  Surgeon: Lemar Lofty., MD;  [facility];  Service: Gastroenterology;  Laterality: N/A;   ESOPHAGOGASTRODUODENOSCOPY     HERNIA REPAIR  MASTECTOMY Right 2007   PLANTAR FASCIA SURGERY Left    THYROIDECTOMY N/A 02/23/2014   Procedure: TOTAL THYROIDECTOMY;  Surgeon: Darnell Level, MD;  Location: [facility];  Service: General;  Laterality: N/A;   TOTAL KNEE ARTHROPLASTY Right 07/02/2015   TOTAL KNEE ARTHROPLASTY Right 07/02/2015   Procedure: TOTAL KNEE ARTHROPLASTY;  Surgeon: Salvatore Marvel, MD;  Location: [facility];  Service: Orthopedics;  Laterality: Right;   TOTAL THYROIDECTOMY  1976   removed three tumor (2 on right; 1 on left)   TUBAL LIGATION     UMBILICAL HERNIA REPAIR  1986   VAGINAL PROLAPSE REPAIR  05/11/2012   Procedure: VAGINAL VAULT SUSPENSION;  Surgeon: Martina Sinner, MD;  Location: [facility];  Service:  Urology;;   VULVA / PERINEUM BIOPSY     vulvar    Family History  Problem Relation Age of Onset   Hypertension Maternal Grandmother    Heart disease Maternal Grandmother    Stroke Maternal Grandmother    Hypertension Maternal Grandfather    Hypertension Father    Diabetes Father    Alcohol abuse Father    Lung cancer Father        lung, psa   Hypertension Mother    Heart disease Mother        pacer, CHF   Hyperlipidemia Mother    Kidney disease Mother        1 kidney due to stone   Kidney Stones Mother    Diabetes Brother    Hypertension Brother    Hyperlipidemia Brother    Kidney disease Brother        dialysis   Stroke Brother    Hypertension Brother    Hyperlipidemia Brother    Hypertension Brother    Hyperlipidemia Brother    Benign prostatic hyperplasia Brother    Diabetes Sister    Hypertension Sister    Breast cancer Sister    Hyperlipidemia Sister    Hypertension Sister    Diabetes Sister    Thyroid disease Sister    Kidney disease Sister    Endometriosis Sister    Breast cancer Sister        thyroid and breast   Thyroid cancer Sister    Arthritis Sister        back pain   Hypertension Sister    Thyroid disease Sister    Hypertension Daughter    Hypertension Daughter    Hyperlipidemia Daughter    Diabetes Daughter    Arthritis Son    Hypertension Maternal Aunt    Hypertension Maternal Uncle    Hypertension Paternal Aunt    Cancer Paternal Aunt    Hypertension Paternal Uncle    Colon cancer Neg Hx    Stomach cancer Neg Hx    Esophageal cancer Neg Hx    Pancreatic cancer Neg Hx    Liver disease Neg Hx    Rectal cancer Neg Hx    Inflammatory bowel disease Neg Hx     Social History   Socioeconomic History   Marital status: Married    Spouse name: Not on file   Number of children: 3   Years of education: Not on file   Highest education level: Not on file  Occupational History    Comment: Retired  Tobacco Use   Smoking status: Never    Smokeless tobacco: Never  Vaping Use   Vaping status: Never Used  Substance and Sexual Activity   Alcohol use: Not Currently   Drug use: Not  Currently   Sexual activity: Not Currently    Birth control/protection: Surgical    Comment: lives with husband, retired from various jobs, diabetic diet  Other Topics Concern   Not on file  Social History Narrative   Not on file   Social Drivers of Health   Financial Resource Strain: Low Risk  (11/13/2022)   Overall Financial Resource Strain (CARDIA)    Difficulty of Paying Living Expenses: Not hard at all  Food Insecurity: No Food Insecurity (02/25/2023)   Hunger Vital Sign    Worried About Running Out of Food in the Last Year: Never true    Ran Out of Food in the Last Year: Never true  Transportation Needs: No Transportation Needs (02/25/2023)   PRAPARE - Administrator, Civil Service (Medical): No    Lack of Transportation (Non-Medical): No  Physical Activity: Inactive (12/03/2022)   Exercise Vital Sign    Days of Exercise per Week: 0 days    Minutes of Exercise per Session: 0 min  Stress: No Stress Concern Present (11/13/2022)   [employer] of Occupational Health - Occupational Stress Questionnaire    Feeling of Stress : Only a little  Social Connections: Socially Integrated (12/03/2022)   Social Connection and Isolation Panel [NHANES]    Frequency of Communication with Friends and Family: More than three times a week    Frequency of Social Gatherings with Friends and Family: Once a week    Attends Religious Services: More than 4 times per year    Active Member of [employer] or Organizations: Yes    Attends Engineer, structural: More than 4 times per year    Marital Status: Married  Catering manager Violence: Not At Risk (02/25/2023)   Humiliation, Afraid, Rape, and Kick questionnaire    Fear of Current or Ex-Partner: No    Emotionally Abused: No    Physically Abused: No    Sexually Abused: No    Outpatient  Medications Prior to Visit  Medication Sig Dispense Refill   acetaminophen (TYLENOL) 325 MG tablet Take 2 tablets (650 mg total) by mouth every 6 (six) hours as needed for moderate pain (pain score 4-6). 100 tablet 0   alendronate (FOSAMAX) 70 MG tablet Take 1 tablet (70 mg total) by mouth once a week. Take with a full glass of water on an empty stomach. 12 tablet 3   aspirin 81 MG EC tablet Take 81 mg by mouth every other day. Swallow whole.     atorvastatin (LIPITOR) 40 MG tablet TAKE 1 TABLET BY MOUTH EVERY OTHER DAY, ALTERNATING WITH 1/2 TABLET 180 tablet 1   Calcium Carbonate-Vitamin D (OS-CAL 500 + D PO)      Calcium Polycarbophil (FIBER) 625 MG TABS Take 1 tablet by mouth daily.     Carboxymethylcellul-Glycerin (REFRESH OPTIVE OP) Apply to eye.     cetirizine HCl (ZYRTEC) 1 MG/ML solution Take 5 mLs (5 mg total) by mouth daily. 300 mL 0   Cholecalciferol (VITAMIN D) 2000 UNITS CAPS Take 1 capsule by mouth every evening.      Ciclopirox 1 % shampoo Apply 1(one) application(s) topically 2(two) times a week 120 mL 11   clindamycin (CLEOCIN) 150 MG capsule Take 3 capsules (450 mg total) by mouth 1 hour prior to dental appt 3 capsule 0   cycloSPORINE (RESTASIS) 0.05 % ophthalmic emulsion Place 1 drop into both eyes 2 (two) times daily. 180 each 3   diltiazem (TIADYLT ER) 360 MG 24 hr capsule  Take 1 capsule (360 mg total) by mouth daily. 90 capsule 1   Estradiol 10 MCG TABS vaginal tablet Place 1 tablet (10 mcg total) vaginally every other day. 48 tablet 4   gabapentin (NEURONTIN) 300 MG capsule Take 1 capsule (300 mg total) by mouth at bedtime. 90 capsule 1   glucose blood (ONE TOUCH TEST STRIPS) test strip Use as directed twice daily to check blood sugar. DX code E11.9 300 each 1   irbesartan (AVAPRO) 75 MG tablet Take 1 tablet (75 mg total) by mouth daily. 30 tablet 3   levocetirizine (XYZAL) 5 MG tablet Take 1 tablet (5 mg total) by mouth every evening. 90 tablet 3   levothyroxine  (SYNTHROID) 88 MCG tablet Take 1 tablet (88 mcg total) by mouth daily. **SKIP SUNDAY** 90 tablet 0   loteprednol (LOTEMAX) 0.5 % ophthalmic suspension Place 1 drop into both eyes 4 (four) times daily as needed with flare up 5 mL 3   metFORMIN (GLUCOPHAGE-XR) 750 MG 24 hr tablet Take 1 tablet (750 mg total) by mouth in the morning and at bedtime. 180 tablet 0   metoprolol succinate (TOPROL-XL) 25 MG 24 hr tablet Take 1 tablet (25 mg total) by mouth 2 (two) times daily. 180 tablet 1   mometasone (NASONEX) 50 MCG/ACT nasal spray Place 1-2 sprays in each nostril daily as needed for stuffy nose. 51 g 3   NEXIUM 40 MG capsule Take 1 capsule (40 mg total) by mouth daily. 90 capsule 1   Probiotic Product (PROBIOTIC DAILY PO) Take 1 capsule by mouth daily.     promethazine-dextromethorphan (PROMETHAZINE-DM) 6.25-15 MG/5ML syrup Take 2.5 mLs by mouth 3 (three) times daily as needed for cough. 100 mL 0   spironolactone (ALDACTONE) 25 MG tablet Take 1.5 tablets (37.5 mg total) by mouth daily. 135 tablet 1   No facility-administered medications prior to visit.    Allergies  Allergen Reactions   Paxlovid [Nirmatrelvir-Ritonavir]    Codeine Anxiety    Jittery    Penicillins Itching   Sulfa Antibiotics Itching   Vicodin [Hydrocodone-Acetaminophen] Itching    ITCHING     Review of Systems  Constitutional:  Negative for fever and malaise/fatigue.  HENT:  Negative for congestion.   Eyes:  Negative for blurred vision.  Respiratory:  Negative for shortness of breath.   Cardiovascular:  Negative for chest pain, palpitations and leg swelling.  Gastrointestinal:  Negative for abdominal pain, blood in stool and nausea.  Genitourinary:  Negative for dysuria and frequency.  Musculoskeletal:  Negative for falls.  Skin:  Negative for rash.  Neurological:  Negative for dizziness, loss of consciousness and headaches.  Endo/Heme/Allergies:  Negative for environmental allergies.  Psychiatric/Behavioral:  Negative  for depression. The patient is not nervous/anxious.        Objective:    Physical Exam Constitutional:      General: She is not in acute distress.    Appearance: Normal appearance. She is well-developed. She is not toxic-appearing.  HENT:     Head: Normocephalic and atraumatic.     Right Ear: External ear normal.     Left Ear: External ear normal.     Nose: Nose normal.  Eyes:     General:        Right eye: No discharge.        Left eye: No discharge.     Conjunctiva/sclera: Conjunctivae normal.  Neck:     Thyroid: No thyromegaly.  Cardiovascular:     Rate and Rhythm:  Normal rate and regular rhythm.     Heart sounds: Normal heart sounds. No murmur heard. Pulmonary:     Effort: Pulmonary effort is normal. No respiratory distress.     Breath sounds: Normal breath sounds.  Abdominal:     General: Bowel sounds are normal.     Palpations: Abdomen is soft.     Tenderness: There is no abdominal tenderness. There is no guarding.  Musculoskeletal:        General: Normal range of motion.     Cervical back: Neck supple.  Lymphadenopathy:     Cervical: No cervical adenopathy.  Skin:    General: Skin is warm and dry.  Neurological:     Mental Status: She is alert and oriented to person, place, and time.  Psychiatric:        Mood and Affect: Mood normal.        Behavior: Behavior normal.        Thought Content: Thought content normal.        Judgment: Judgment normal.     BP 121/69 (BP Location: Left Arm, Patient Position: Sitting, Cuff Size: Normal)   Pulse 68   Temp 98 F (36.7 C) (Oral)   Resp 16   Ht 5\' 4"  (1.626 m)   Wt 155 lb 6.4 oz (70.5 kg)   SpO2 95%   BMI 26.67 kg/m  Wt Readings from Last 3 Encounters:  06/15/23 155 lb 6.4 oz (70.5 kg)  05/27/23 156 lb (70.8 kg)  03/04/23 160 lb (72.6 kg)    Diabetic Foot Exam - Simple   No data filed    Lab Results  Component Value Date   WBC 6.2 06/15/2023   HGB 13.0 06/15/2023   HCT 40.2 06/15/2023   PLT 296.0  06/15/2023   GLUCOSE 90 06/15/2023   CHOL 158 06/15/2023   TRIG 117 06/15/2023   HDL 51 06/15/2023   LDLDIRECT 107.0 07/01/2016   LDLCALC 86 06/15/2023   ALT 15 06/15/2023   AST 13 06/15/2023   NA 139 06/15/2023   K 4.6 06/15/2023   CL 102 06/15/2023   CREATININE 0.78 06/15/2023   BUN 19 06/15/2023   CO2 25 06/15/2023   TSH 0.49 06/15/2023   INR 1.01 06/21/2015   HGBA1C 6.8 (H) 06/15/2023   MICROALBUR <0.7 06/15/2023    Lab Results  Component Value Date   TSH 0.49 06/15/2023   Lab Results  Component Value Date   WBC 6.2 06/15/2023   HGB 13.0 06/15/2023   HCT 40.2 06/15/2023   MCV 98.5 06/15/2023   PLT 296.0 06/15/2023   Lab Results  Component Value Date   NA 139 06/15/2023   K 4.6 06/15/2023   CO2 25 06/15/2023   GLUCOSE 90 06/15/2023   BUN 19 06/15/2023   CREATININE 0.78 06/15/2023   BILITOT 0.5 06/15/2023   ALKPHOS 53 12/31/2022   AST 13 06/15/2023   ALT 15 06/15/2023   PROT 6.5 06/15/2023   ALBUMIN 4.3 12/31/2022   CALCIUM 9.4 06/15/2023   ANIONGAP 9 03/13/2021   GFR 61.15 12/31/2022   Lab Results  Component Value Date   CHOL 158 06/15/2023   Lab Results  Component Value Date   HDL 51 06/15/2023   Lab Results  Component Value Date   LDLCALC 86 06/15/2023   Lab Results  Component Value Date   TRIG 117 06/15/2023   Lab Results  Component Value Date   CHOLHDL 3.1 06/15/2023   Lab Results  Component Value Date  HGBA1C 6.8 (H) 06/15/2023       Assessment & Plan:  Diabetic polyneuropathy associated with diabetes mellitus due to underlying condition [facility]) Assessment & Plan: Control glucose levels, stay active and monitor.    Essential hypertension Assessment & Plan: Well controlled, no changes to meds. Encouraged heart healthy diet such as the DASH diet and exercise as tolerated.   Orders: -     CBC with Differential/Platelet -     Comprehensive metabolic panel -     TSH  Hyperlipidemia, mixed Assessment & Plan: Tolerating  statin, encouraged heart healthy diet, avoid trans fats, minimize simple carbs and saturated fats. Increase exercise as tolerated  Orders: -     Lipid panel  Hypothyroidism, unspecified type Assessment & Plan: On Levothyroxine, continue to monitor   Age-related osteoporosis without current pathological fracture Assessment & Plan: Encouraged to get adequate exercise, calcium and vitamin d intake    Type 2 diabetes mellitus with diabetic neuropathy, without long-term current use of insulin (HCC) Assessment & Plan: hgba1c acceptable, minimize simple carbs. Increase exercise as tolerated. Continue current meds  Orders: -     Hemoglobin A1c -     Microalbumin / creatinine urine ratio  B12 deficiency -     Cyanocobalamin -     Intrinsic Factor Antibodies -     Vitamin B12  History of bilateral knee replacement Assessment & Plan: Doing well still some mild swelling in left knee but able to walk     Assessment and Plan Assessment & Plan Post Knee Replacement Improvement in pain and mobility, but still experiencing some swelling with use. Continued improvement expected over time. -No changes to current management.  Vitamin B12 Deficiency On monthly B12 injections. Discussed potential causes of deficiency, including aging and possible autoimmune factors. Previous intrinsic factor antibody test was negative. -Continue monthly B12 injections. -Check B12 levels today, understanding that levels may be high due to recent injection. -Repeat intrinsic factor antibody test today.  Hypertension Blood pressure readings within normal range on current medication regimen. -Continue current antihypertensive medications. -If blood pressure readings consistently drop below 100/60, patient to notify clinic.  General Health Maintenance / Followup Plans -Complete routine blood work today, including kidney function and thyroid tests. -Return for routine follow-up in September  2025.     Danise Edge, MD

## 2023-06-18 ENCOUNTER — Ambulatory Visit: Payer: Self-pay

## 2023-06-18 ENCOUNTER — Other Ambulatory Visit (HOSPITAL_BASED_OUTPATIENT_CLINIC_OR_DEPARTMENT_OTHER): Payer: Self-pay

## 2023-06-18 LAB — LIPID PANEL
Cholesterol: 158 mg/dL (ref <200)
HDL: 51 mg/dL (ref >=50)
LDL Cholesterol (Calc): 86 mg/dL
Non-HDL Cholesterol (Calc): 107 mg/dL (ref <130)
Total CHOL/HDL Ratio: 3.1 (calc) (ref <5.0)
Triglycerides: 117 mg/dL (ref <150)

## 2023-06-18 LAB — COMPREHENSIVE METABOLIC PANEL
AG Ratio: 1.8 (calc) (ref 1.0–2.5)
ALT: 15 U/L (ref 6–29)
AST: 13 U/L (ref 10–35)
Albumin: 4.2 g/dL (ref 3.6–5.1)
Alkaline phosphatase (APISO): 44 U/L (ref 37–153)
BUN: 19 mg/dL (ref 7–25)
CO2: 25 mmol/L (ref 20–32)
Calcium: 9.4 mg/dL (ref 8.6–10.4)
Chloride: 102 mmol/L (ref 98–110)
Creat: 0.78 mg/dL (ref 0.60–1.00)
Globulin: 2.3 g/dL (ref 1.9–3.7)
Glucose, Bld: 90 mg/dL (ref 65–99)
Potassium: 4.6 mmol/L (ref 3.5–5.3)
Sodium: 139 mmol/L (ref 135–146)
Total Bilirubin: 0.5 mg/dL (ref 0.2–1.2)
Total Protein: 6.5 g/dL (ref 6.1–8.1)

## 2023-06-18 LAB — INTRINSIC FACTOR ANTIBODIES: Intrinsic Factor: POSITIVE — AB

## 2023-06-18 LAB — TSH: TSH: 0.49 m[IU]/L (ref 0.40–4.50)

## 2023-06-18 LAB — VITAMIN B12: Vitamin B-12: >2000 pg/mL — ABNORMAL HIGH (ref 200–1100)

## 2023-06-18 NOTE — Patient Instructions (Signed)
 Visit Information  Thank you for taking time to visit with me today. Please don't hesitate to contact me if I can be of assistance to you.   Following are the goals we discussed today:  Continue to take medications as prescribed. Continue to attend provider visits as scheduled Continue to eat healthy, lean meats, vegetables, fruits, avoid saturated and transfats Contact provider with health questions or concerns as needed Continue to check blood sugar as recommended and notify provider if questions or concerns Continue to check blood pressure routinely and contact provider if questions or concerns  Please call the care guide team at [phone] if you need to schedule an appointment.   If you are experiencing a Mental Health or Behavioral Health Crisis or need someone to talk to, please call the Suicide and Crisis Lifeline: 988 call the [place] National Suicide Prevention Lifeline: 318 603 2253 or TTY: 702-449-0850 TTY 205-705-0747) to talk to a trained counselor  Kathyrn Sheriff, RN, MSN, BSN, CCM [place]  [facility], Population Health Case Manager Phone: [phone]

## 2023-06-18 NOTE — Patient Outreach (Signed)
 Care Coordination   Follow Up Visit Note   06/18/2023 Name: Victoria Walker MRN: [id number] DOB: 1951/01/22  Victoria Walker is a 73 y.o. year old female who sees Victoria Canary, MD for primary care. I spoke with  Victoria Walker by phone today.  What matters to the patients health and wellness today?  [name] reports she is doing good. Visit with PCP completed 06/15/23. A1C 6.8.  Per patient target A1C < 7.0. Per review of chart office visit 06/15/23: HTN well controlled. She reports since the weather is getting better, she will increase her activity. She denies any questions or concerns at this time. Denies any educational needs. Patient to contact RNCM if care management needs in the future.  Goals Addressed             This Visit's Progress    COMPLETED: Education-diabetes       Interventions Today    Flowsheet Row Most Recent Value  Chronic Disease   Chronic disease during today's visit Diabetes, Hypertension (HTN)  General Interventions   General Interventions Discussed/Reviewed General Interventions Reviewed, Doctor Visits  [Evaluation of current treatment plan for health condition and patient's adherence to plan. assess for management of BS and HTN management. Provided RNCM contact number and encouraged to call if care managment needs in the future]  Doctor Visits Discussed/Reviewed PCP, Specialist  PCP/Specialist Visits Compliance with follow-up visit  [reviewed upcoming appointments]  Exercise Interventions   Exercise Discussed/Reviewed Physical Activity, Exercise Reviewed  [discussed the importance of exercise in the managment of blood sugars. advised to exercise as tolerated per provider recommendations]  Nutrition Interventions   Nutrition Discussed/Reviewed Nutrition Reviewed  [discussed avoid simple carbohydrates, processed food, saturated and trans fats. encouraged lean protean vegetables and fruit, fiber.]  Pharmacy Interventions   Pharmacy  Dicussed/Reviewed Pharmacy Topics Reviewed  [medications reviewed. assess for any medication management concerns]                SDOH assessments and interventions completed:  Yes  Care Coordination Interventions:  Yes, provided   Follow up plan: No further intervention required.   Encounter Outcome:  Patient Visit Completed   Kathyrn Sheriff, RN, MSN, BSN, CCM [place]  [facility], Population Health Case Manager Phone: [phone]

## 2023-06-22 ENCOUNTER — Other Ambulatory Visit (HOSPITAL_BASED_OUTPATIENT_CLINIC_OR_DEPARTMENT_OTHER): Payer: Self-pay

## 2023-06-30 ENCOUNTER — Ambulatory Visit: Payer: Medicare Other | Admitting: Family Medicine

## 2023-06-30 ENCOUNTER — Telehealth: Payer: Self-pay | Admitting: Neurology

## 2023-06-30 NOTE — Telephone Encounter (Signed)
 Forwarding to PA team.   Copied from CRM [id number]. Topic: General - Other >> Jun 30, 2023 10:32 AM Truddie Crumble wrote: Reason for CRM: patient called stating she need a prior authorization for NEXIUM 40 MG capsule

## 2023-07-01 ENCOUNTER — Encounter: Payer: Self-pay | Admitting: Emergency Medicine

## 2023-07-01 ENCOUNTER — Telehealth: Payer: Self-pay

## 2023-07-01 ENCOUNTER — Other Ambulatory Visit (HOSPITAL_COMMUNITY): Payer: Self-pay

## 2023-07-01 NOTE — Telephone Encounter (Signed)
 Pharmacy Patient Advocate Encounter   Received notification from Pt Calls Messages that prior authorization for Esomeprazole 40mg  caps is required/requested.   Insurance verification completed.   The patient is insured through [place] .   Per test claim: Refill too soon. PA is not needed at this time. Medication was filled 06/12/2023. Next eligible fill date is 07/05/2023.

## 2023-07-01 NOTE — Telephone Encounter (Signed)
 Copied from CRM [id number]. Topic: General - Other >> Jul 01, 2023  8:50 AM [place] E wrote: Reason for CRM: Patient called in regarding the message she received in MyChart about the software issues with the lab. Patient did not have an urgent need, but a general concern if she will have to retest. Callback number for patient is [phone] for clarification.

## 2023-07-01 NOTE — Telephone Encounter (Signed)
 Called and spoke with patient. Explained why she received the letter. Patient expressed understanding.

## 2023-07-01 NOTE — Telephone Encounter (Signed)
 Per test claim: Refill too soon. PA is not needed at this time. Medication was filled 06/12/2023. Next eligible fill date is 07/05/2023.

## 2023-07-08 ENCOUNTER — Other Ambulatory Visit (HOSPITAL_BASED_OUTPATIENT_CLINIC_OR_DEPARTMENT_OTHER): Payer: Self-pay

## 2023-07-08 ENCOUNTER — Other Ambulatory Visit: Payer: Self-pay | Admitting: Emergency Medicine

## 2023-07-08 ENCOUNTER — Telehealth: Payer: Self-pay | Admitting: Pharmacy Technician

## 2023-07-08 ENCOUNTER — Other Ambulatory Visit: Payer: Self-pay | Admitting: Family Medicine

## 2023-07-08 ENCOUNTER — Other Ambulatory Visit (HOSPITAL_COMMUNITY): Payer: Self-pay

## 2023-07-08 MED ORDER — IRBESARTAN 75 MG PO TABS
75.0000 mg | ORAL_TABLET | Freq: Every day | ORAL | 3 refills | Status: DC
  Filled 2023-07-08 – 2023-07-20 (×2): qty 30, 30d supply, fill #0
  Filled 2023-08-17: qty 30, 30d supply, fill #1
  Filled 2023-09-12: qty 30, 30d supply, fill #2
  Filled 2023-10-12: qty 30, 30d supply, fill #3

## 2023-07-08 NOTE — Telephone Encounter (Signed)
 Pharmacy Patient Advocate Encounter   Received notification from CoverMyMeds that prior authorization for NEXIUM 40 MG delayed-release capsules is required/requested.   Insurance verification completed.   The patient is insured through [facility] .   Per test claim: PA required; PA submitted to above mentioned insurance via CoverMyMeds Key/confirmation #/EOC [id number] Status is pending

## 2023-07-09 ENCOUNTER — Other Ambulatory Visit (HOSPITAL_BASED_OUTPATIENT_CLINIC_OR_DEPARTMENT_OTHER): Payer: Self-pay

## 2023-07-09 NOTE — Telephone Encounter (Signed)
 Called patient. No answer. LVM

## 2023-07-09 NOTE — Telephone Encounter (Signed)
 Pharmacy Patient Advocate Encounter  Received notification from [facility] that Prior Authorization for NEXIUM 40 MG delayed-release capsules  has been DENIED.  Full denial letter will be uploaded to the media tab. See denial reason below.   PA #/Case ID/Reference #: [id number]

## 2023-07-10 ENCOUNTER — Telehealth: Payer: Self-pay | Admitting: Emergency Medicine

## 2023-07-10 NOTE — Telephone Encounter (Signed)
 Copied from CRM [id number]. Topic: General - Other >> Jul 10, 2023  2:30 PM Isabelle Course C wrote: Reason for CRM: [employer] will call about appeal for medication. Message to [employer]... Please give patient a call back

## 2023-07-10 NOTE — Telephone Encounter (Signed)
 Patient would like a call back she received a call from the office

## 2023-07-10 NOTE — Telephone Encounter (Signed)
 Called and spoke with patient. She had already been contacted about her denial. She is going to try to get it appealed. Patient said she is going to call [employer] and see if she can get the phone number and paperwork

## 2023-07-10 NOTE — Telephone Encounter (Signed)
 Called and spoke with patient. She spoke with [employer]. They are suppose to call the office about appealing the Denial of Nexium

## 2023-07-13 ENCOUNTER — Telehealth: Payer: Self-pay

## 2023-07-13 ENCOUNTER — Other Ambulatory Visit (HOSPITAL_BASED_OUTPATIENT_CLINIC_OR_DEPARTMENT_OTHER): Payer: Self-pay

## 2023-07-13 DIAGNOSIS — B379 Candidiasis, unspecified: Secondary | ICD-10-CM

## 2023-07-13 MED ORDER — FLUCONAZOLE 150 MG PO TABS
ORAL_TABLET | ORAL | 0 refills | Status: DC
Start: 2023-07-13 — End: 2023-08-18
  Filled 2023-07-13: qty 2, 6d supply, fill #0

## 2023-07-13 NOTE — Telephone Encounter (Signed)
 Patient called requesting medication for a yeast infection. Patient reports vaginal itching and burning x 1 week. Diflucan 150 mg take 1 tablet PO was sent to her pharmacy. Understanding was voiced. Paytyn Mesta l Ming Kunka, CMA

## 2023-07-14 ENCOUNTER — Telehealth: Payer: Self-pay | Admitting: Emergency Medicine

## 2023-07-14 ENCOUNTER — Telehealth: Payer: Self-pay

## 2023-07-14 NOTE — Telephone Encounter (Signed)
 Called patient and spoke with patient. She said she was going to get her urine checked at her OB/GYN appointment tomorrow.

## 2023-07-14 NOTE — Telephone Encounter (Signed)
 Copied from CRM [id number]. Topic: General - Call Back - No Documentation >> Jul 14, 2023  2:27 PM Isabelle Course C wrote: Reason for CRM: Patient missed call from [place] and wants her to give her a call back

## 2023-07-14 NOTE — Telephone Encounter (Signed)
 Copied from CRM [id number]. Topic: General - Other >> Jul 13, 2023  4:17 PM Truddie Crumble wrote: Reason for CRM: patient called stating while she is coming in for her B12 on 3/26 she would like to see if the nurse can check her urine for a UTI and yeast

## 2023-07-14 NOTE — Telephone Encounter (Signed)
 Called patient. No answer. LVM

## 2023-07-14 NOTE — Telephone Encounter (Signed)
 Copied from CRM [id number]. Topic: General - Other >> Jul 13, 2023  4:32 PM Melissa C wrote: Reason for CRM: patient wants to amend previous CRM regarding getting UTI checked during her appointment on 3/26- she is going to get that done elsewhere. She would, however still like someone from the clinical team to give her a call before her appointment so she cal talk with them. Thank you

## 2023-07-15 ENCOUNTER — Ambulatory Visit: Payer: Medicare Other

## 2023-07-15 ENCOUNTER — Ambulatory Visit

## 2023-07-15 VITALS — BP 118/68 | HR 59 | Ht 65.0 in | Wt 156.0 lb

## 2023-07-15 DIAGNOSIS — R3915 Urgency of urination: Secondary | ICD-10-CM | POA: Diagnosis not present

## 2023-07-15 LAB — POCT URINALYSIS DIPSTICK
Bilirubin, UA: NEGATIVE
Blood, UA: NEGATIVE
Glucose, UA: NEGATIVE
Ketones, UA: NEGATIVE
Leukocytes, UA: NEGATIVE
Nitrite, UA: NEGATIVE
Protein, UA: NEGATIVE
Spec Grav, UA: <=1.005 — AB (ref 1.010–1.025)
Urobilinogen, UA: NEGATIVE U/dL — AB
pH, UA: 6 (ref 5.0–8.0)

## 2023-07-15 NOTE — Progress Notes (Signed)
 SUBJECTIVE:  73 y.o. female complains of urinary urgency Denies abnormal vaginal bleeding or significant pelvic pain or fever.Denies history of known exposure to STD.  No LMP recorded. Patient has had a hysterectomy.  OBJECTIVE:  She appears alert, well appearing, in no apparent distress Urine dipstick: negative for all components.  ASSESSMENT:  Dysuria    PLAN:  Patient aware of neg dipstick. ROV prn if symptoms persist or worsen.   Danna Hefty [employer]

## 2023-07-15 NOTE — Telephone Encounter (Signed)
 Called and spoke to patient. She provided the number to [employer]

## 2023-07-17 ENCOUNTER — Ambulatory Visit

## 2023-07-17 DIAGNOSIS — E538 Deficiency of other specified B group vitamins: Secondary | ICD-10-CM | POA: Diagnosis not present

## 2023-07-17 MED ORDER — CYANOCOBALAMIN 1000 MCG/ML IJ SOLN
1000.0000 ug | Freq: Once | INTRAMUSCULAR | Status: AC
  Administered 2023-07-17: 1000 ug via INTRAMUSCULAR

## 2023-07-17 NOTE — Progress Notes (Signed)
 Pt here for monthly B12 injection per Abner Greenspan  B12 given IM, and pt tolerated injection well.  Next B12 injection scheduled for 04/29

## 2023-07-20 ENCOUNTER — Other Ambulatory Visit: Payer: Self-pay | Admitting: Family Medicine

## 2023-07-20 ENCOUNTER — Other Ambulatory Visit (HOSPITAL_BASED_OUTPATIENT_CLINIC_OR_DEPARTMENT_OTHER): Payer: Self-pay

## 2023-07-20 MED ORDER — DILTIAZEM HCL ER BEADS 360 MG PO CP24
360.0000 mg | ORAL_CAPSULE | Freq: Every day | ORAL | 1 refills | Status: DC
  Filled 2023-07-20: qty 90, 90d supply, fill #0

## 2023-07-21 ENCOUNTER — Other Ambulatory Visit (HOSPITAL_BASED_OUTPATIENT_CLINIC_OR_DEPARTMENT_OTHER): Payer: Self-pay

## 2023-07-21 ENCOUNTER — Other Ambulatory Visit: Payer: Self-pay | Admitting: Family Medicine

## 2023-07-21 MED ORDER — ALENDRONATE SODIUM 70 MG PO TABS
70.0000 mg | ORAL_TABLET | ORAL | 3 refills | Status: AC
  Filled 2023-07-21 – 2023-07-22 (×2): qty 12, 84d supply, fill #0
  Filled 2023-10-29: qty 12, 84d supply, fill #1
  Filled 2024-01-19: qty 12, 84d supply, fill #2
  Filled 2024-04-15: qty 12, 84d supply, fill #3

## 2023-07-22 ENCOUNTER — Telehealth: Payer: Self-pay | Admitting: Pharmacy Technician

## 2023-07-22 ENCOUNTER — Other Ambulatory Visit: Payer: Self-pay

## 2023-07-22 ENCOUNTER — Other Ambulatory Visit (HOSPITAL_COMMUNITY): Payer: Self-pay

## 2023-07-22 NOTE — Telephone Encounter (Signed)
 Pharmacy Patient Advocate Encounter   Received notification from CoverMyMeds that prior authorization for dilTIAZem HCl ER Beads 360MG  er capsules is required/requested.   Insurance verification completed.   The patient is insured through [facility] .   Per test claim: The current 90 day co-pay is, $0.00.  No PA needed at this time. This test claim was processed through [facility]- copay amounts may vary at other pharmacies due to pharmacy/plan contracts, or as the patient moves through the different stages of their insurance plan.

## 2023-07-29 NOTE — Telephone Encounter (Signed)
 Copied from CRM [id number]. Topic: Clinical - Medication Question >> Jul 29, 2023  9:38 AM Elizebeth Brooking wrote: Reason for CRM: Patient called in stating [facility] sent a appeal form regarding NEXIUM 40 MG capsule, would like for Juanetta to give her a callback regarding this

## 2023-07-30 ENCOUNTER — Other Ambulatory Visit (HOSPITAL_BASED_OUTPATIENT_CLINIC_OR_DEPARTMENT_OTHER): Payer: Self-pay

## 2023-07-30 MED ORDER — AZITHROMYCIN 500 MG PO TABS
ORAL_TABLET | ORAL | 0 refills | Status: DC
  Filled 2023-07-30: qty 2, 1d supply, fill #0

## 2023-07-31 NOTE — Telephone Encounter (Signed)
 Called and spoke with patient. She said the number she gave me was the wrong number and she will try to reach back out to. [employer]

## 2023-08-03 NOTE — Telephone Encounter (Signed)
 Copied from CRM [id number]. Topic: General - Other >> Aug 03, 2023  1:52 PM Tiffany S wrote: Reason for CRM: The correct number for appeals [phone]  NEXIUM 40 MG for Nurse Alexandria Ida

## 2023-08-05 ENCOUNTER — Encounter: Payer: Self-pay | Admitting: *Deleted

## 2023-08-05 NOTE — Telephone Encounter (Signed)
 Spoke with patient she has tried Prevacid and Aciphex in the past and it was not affective.  Appeal letter faxed.

## 2023-08-11 ENCOUNTER — Ambulatory Visit: Payer: Self-pay

## 2023-08-11 ENCOUNTER — Other Ambulatory Visit (HOSPITAL_BASED_OUTPATIENT_CLINIC_OR_DEPARTMENT_OTHER): Payer: Self-pay

## 2023-08-11 NOTE — Telephone Encounter (Signed)
 Reason for Disposition  [1] Follow-up call from patient regarding patient's clinical status AND [2] information NON-URGENT  Answer Assessment - Initial Assessment Questions 1. REASON FOR CALL or QUESTION: "What is your reason for calling today?" or "How can I best help you?" or "What question do you have that I can help answer?"     Patient inquiring about status of Nexium , stating that the insurance has approved it but needing provider to approve. Please advise. 2. CALLER: Document the source of call. (e.g., laboratory, patient).     Patient  Protocols used: PCP Call - No Triage-A-AH

## 2023-08-17 ENCOUNTER — Other Ambulatory Visit (HOSPITAL_BASED_OUTPATIENT_CLINIC_OR_DEPARTMENT_OTHER): Payer: Self-pay

## 2023-08-17 ENCOUNTER — Other Ambulatory Visit: Payer: Self-pay | Admitting: Family Medicine

## 2023-08-17 MED ORDER — METOPROLOL SUCCINATE ER 25 MG PO TB24
25.0000 mg | ORAL_TABLET | Freq: Two times a day (BID) | ORAL | 1 refills | Status: DC
  Filled 2023-08-17: qty 180, 90d supply, fill #0

## 2023-08-17 MED ORDER — SPIRONOLACTONE 25 MG PO TABS
37.5000 mg | ORAL_TABLET | Freq: Every day | ORAL | 1 refills | Status: DC
  Filled 2023-08-17: qty 135, 90d supply, fill #0
  Filled 2023-11-15: qty 135, 90d supply, fill #1

## 2023-08-17 NOTE — Progress Notes (Unsigned)
 Established patient visit   Patient: Victoria Walker   DOB: May 25, 1950   73 y.o. Female  MRN: [id number] Visit Date: 08/18/2023  Today's healthcare provider: Trenton Frock, PA-C   No chief complaint on file.  Subjective     ***  Medications: Outpatient Medications Prior to Visit  Medication Sig   acetaminophen  (TYLENOL ) 325 MG tablet Take 2 tablets (650 mg total) by mouth every 6 (six) hours as needed for moderate pain (pain score 4-6).   alendronate  (FOSAMAX ) 70 MG tablet Take 1 tablet (70 mg total) by mouth once a week. Take with a full glass of water  on an empty stomach.   aspirin  81 MG EC tablet Take 81 mg by mouth every other day. Swallow whole.   atorvastatin  (LIPITOR) 40 MG tablet TAKE 1 TABLET BY MOUTH EVERY OTHER DAY, ALTERNATING WITH 1/2 TABLET   azithromycin  (ZITHROMAX ) 500 MG tablet Take 2 tablets (1,000 mg total) by mouth daily for 1 dose. Take one hour prior to dental procedure.   Calcium  Carbonate-Vitamin D  (OS-CAL 500 + D PO)    Calcium  Polycarbophil (FIBER) 625 MG TABS Take 1 tablet by mouth daily.   Carboxymethylcellul-Glycerin (REFRESH OPTIVE OP) Apply to eye.   cetirizine  HCl (ZYRTEC ) 1 MG/ML solution Take 5 mLs (5 mg total) by mouth daily.   Cholecalciferol (VITAMIN D ) 2000 UNITS CAPS Take 1 capsule by mouth every evening.    Ciclopirox  1 % shampoo Apply 1(one) application(s) topically 2(two) times a week   clindamycin  (CLEOCIN ) 150 MG capsule Take 3 capsules (450 mg total) by mouth 1 hour prior to dental appt (Patient not taking: Reported on 07/15/2023)   cycloSPORINE  (RESTASIS ) 0.05 % ophthalmic emulsion Place 1 drop into both eyes 2 (two) times daily.   diltiazem  (TIADYLT  ER) 360 MG 24 hr capsule Take 1 capsule (360 mg total) by mouth daily.   Estradiol  10 MCG TABS vaginal tablet Place 1 tablet (10 mcg total) vaginally every other day.   fluconazole  (DIFLUCAN ) 150 MG tablet Take 1 tablet by mouth. Repeat in 3 days if symptoms persists. (Patient not  taking: Reported on 07/15/2023)   gabapentin  (NEURONTIN ) 300 MG capsule Take 1 capsule (300 mg total) by mouth at bedtime.   glucose blood (ONE TOUCH TEST STRIPS) test strip Use as directed twice daily to check blood sugar. DX code E11.9   irbesartan  (AVAPRO ) 75 MG tablet Take 1 tablet (75 mg total) by mouth daily.   levocetirizine (XYZAL ) 5 MG tablet Take 1 tablet (5 mg total) by mouth every evening.   levothyroxine  (SYNTHROID ) 88 MCG tablet Take 1 tablet (88 mcg total) by mouth daily. **SKIP SUNDAY**   loteprednol  (LOTEMAX ) 0.5 % ophthalmic suspension Place 1 drop into both eyes 4 (four) times daily as needed with flare up   metFORMIN  (GLUCOPHAGE -XR) 750 MG 24 hr tablet Take 1 tablet (750 mg total) by mouth in the morning and at bedtime.   metoprolol  succinate (TOPROL -XL) 25 MG 24 hr tablet Take 1 tablet (25 mg total) by mouth 2 (two) times daily.   mometasone  (NASONEX ) 50 MCG/ACT nasal spray Place 1-2 sprays in each nostril daily as needed for stuffy nose.   NEXIUM  40 MG capsule Take 1 capsule (40 mg total) by mouth daily.   Probiotic Product (PROBIOTIC DAILY PO) Take 1 capsule by mouth daily.   promethazine -dextromethorphan (PROMETHAZINE -DM) 6.25-15 MG/5ML syrup Take 2.5 mLs by mouth 3 (three) times daily as needed for cough. (Patient not taking: Reported on 07/15/2023)  spironolactone  (ALDACTONE ) 25 MG tablet Take 1.5 tablets (37.5 mg total) by mouth daily.   No facility-administered medications prior to visit.    Review of Systems {Insert previous labs (optional):23779} {See past labs  Heme  Chem  Endocrine  Serology  Results Review (optional):1}   Objective    There were no vitals taken for this visit. {Insert last BP/Wt (optional):23777}{See vitals history (optional):1}  Physical Exam  ***  No results found for any visits on 08/18/23.  Assessment & Plan    There are no diagnoses linked to this encounter.  ***  No follow-ups on file.       Trenton Frock, PA-C   [facility] [phone] (phone) [phone] (fax)  [facility]

## 2023-08-18 ENCOUNTER — Encounter: Payer: Self-pay | Admitting: Physician Assistant

## 2023-08-18 ENCOUNTER — Ambulatory Visit (INDEPENDENT_AMBULATORY_CARE_PROVIDER_SITE_OTHER): Admitting: Physician Assistant

## 2023-08-18 ENCOUNTER — Ambulatory Visit

## 2023-08-18 VITALS — BP 113/68 | HR 72 | Ht 65.0 in | Wt 158.2 lb

## 2023-08-18 DIAGNOSIS — E538 Deficiency of other specified B group vitamins: Secondary | ICD-10-CM | POA: Diagnosis not present

## 2023-08-18 DIAGNOSIS — I499 Cardiac arrhythmia, unspecified: Secondary | ICD-10-CM | POA: Diagnosis not present

## 2023-08-18 DIAGNOSIS — I959 Hypotension, unspecified: Secondary | ICD-10-CM | POA: Diagnosis not present

## 2023-08-18 DIAGNOSIS — I10 Essential (primary) hypertension: Secondary | ICD-10-CM

## 2023-08-18 MED ORDER — CYANOCOBALAMIN 1000 MCG/ML IJ SOLN
1000.0000 ug | Freq: Once | INTRAMUSCULAR | Status: AC
Start: 2023-08-18 — End: 2023-08-18
  Administered 2023-08-18: 1000 ug via INTRAMUSCULAR

## 2023-08-18 NOTE — Assessment & Plan Note (Deleted)
 Pt managed on spironolactone  25 mg,

## 2023-08-25 ENCOUNTER — Other Ambulatory Visit (HOSPITAL_BASED_OUTPATIENT_CLINIC_OR_DEPARTMENT_OTHER): Payer: Self-pay

## 2023-08-25 MED ORDER — CLINDAMYCIN HCL 150 MG PO CAPS
ORAL_CAPSULE | ORAL | 0 refills | Status: DC
Start: 2023-08-25 — End: 2023-09-21
  Filled 2023-08-25: qty 3, 1d supply, fill #0

## 2023-08-28 ENCOUNTER — Other Ambulatory Visit: Payer: Self-pay

## 2023-08-28 ENCOUNTER — Other Ambulatory Visit (HOSPITAL_BASED_OUTPATIENT_CLINIC_OR_DEPARTMENT_OTHER): Payer: Self-pay

## 2023-08-28 MED ORDER — LOTEPREDNOL ETABONATE 0.5 % OP SUSP
1.0000 [drp] | Freq: Four times a day (QID) | OPHTHALMIC | 3 refills | Status: AC
  Filled 2023-08-28 – 2024-03-14 (×2): qty 5, 25d supply, fill #0

## 2023-08-28 MED ORDER — CYCLOSPORINE 0.05 % OP EMUL
1.0000 [drp] | Freq: Two times a day (BID) | OPHTHALMIC | 3 refills | Status: AC
  Filled 2023-08-28 – 2023-10-12 (×2): qty 180, 90d supply, fill #0
  Filled 2024-01-10: qty 180, 90d supply, fill #1
  Filled 2024-04-25: qty 180, 90d supply, fill #2

## 2023-08-31 ENCOUNTER — Other Ambulatory Visit (HOSPITAL_BASED_OUTPATIENT_CLINIC_OR_DEPARTMENT_OTHER): Payer: Self-pay

## 2023-09-01 DIAGNOSIS — Z96652 Presence of left artificial knee joint: Secondary | ICD-10-CM | POA: Diagnosis not present

## 2023-09-07 ENCOUNTER — Other Ambulatory Visit (HOSPITAL_BASED_OUTPATIENT_CLINIC_OR_DEPARTMENT_OTHER): Payer: Self-pay

## 2023-09-12 ENCOUNTER — Other Ambulatory Visit: Payer: Self-pay | Admitting: Family Medicine

## 2023-09-12 DIAGNOSIS — R109 Unspecified abdominal pain: Secondary | ICD-10-CM

## 2023-09-12 DIAGNOSIS — K219 Gastro-esophageal reflux disease without esophagitis: Secondary | ICD-10-CM

## 2023-09-12 DIAGNOSIS — E785 Hyperlipidemia, unspecified: Secondary | ICD-10-CM

## 2023-09-12 DIAGNOSIS — Z780 Asymptomatic menopausal state: Secondary | ICD-10-CM

## 2023-09-12 DIAGNOSIS — I10 Essential (primary) hypertension: Secondary | ICD-10-CM

## 2023-09-13 ENCOUNTER — Other Ambulatory Visit: Payer: Self-pay

## 2023-09-15 ENCOUNTER — Other Ambulatory Visit (HOSPITAL_BASED_OUTPATIENT_CLINIC_OR_DEPARTMENT_OTHER): Payer: Self-pay

## 2023-09-15 ENCOUNTER — Other Ambulatory Visit: Payer: Self-pay

## 2023-09-15 MED ORDER — GABAPENTIN 300 MG PO CAPS
300.0000 mg | ORAL_CAPSULE | Freq: Every day | ORAL | 1 refills | Status: DC
  Filled 2023-09-15: qty 90, 90d supply, fill #0
  Filled 2023-12-17: qty 90, 90d supply, fill #1

## 2023-09-15 MED ORDER — ATORVASTATIN CALCIUM 40 MG PO TABS
40.0000 mg | ORAL_TABLET | ORAL | 1 refills | Status: AC
  Filled 2023-09-15: qty 68, 90d supply, fill #0
  Filled 2023-12-17: qty 68, 90d supply, fill #1
  Filled 2024-03-14: qty 68, 90d supply, fill #2

## 2023-09-15 MED ORDER — LEVOTHYROXINE SODIUM 88 MCG PO TABS
88.0000 ug | ORAL_TABLET | Freq: Every day | ORAL | 1 refills | Status: DC
  Filled 2023-09-15: qty 90, 90d supply, fill #0
  Filled 2023-12-17: qty 90, 90d supply, fill #1

## 2023-09-15 MED ORDER — METFORMIN HCL ER 750 MG PO TB24
750.0000 mg | ORAL_TABLET | Freq: Two times a day (BID) | ORAL | 1 refills | Status: DC
  Filled 2023-09-15: qty 180, 90d supply, fill #0
  Filled 2023-12-17: qty 180, 90d supply, fill #1

## 2023-09-16 ENCOUNTER — Other Ambulatory Visit (HOSPITAL_BASED_OUTPATIENT_CLINIC_OR_DEPARTMENT_OTHER): Payer: Self-pay

## 2023-09-16 ENCOUNTER — Ambulatory Visit: Admitting: Obstetrics & Gynecology

## 2023-09-16 DIAGNOSIS — L03115 Cellulitis of right lower limb: Secondary | ICD-10-CM | POA: Diagnosis not present

## 2023-09-16 MED ORDER — FLUCONAZOLE 150 MG PO TABS
150.0000 mg | ORAL_TABLET | Freq: Once | ORAL | 0 refills | Status: AC
  Filled 2023-09-16: qty 1, 1d supply, fill #0

## 2023-09-16 MED ORDER — CEFDINIR 300 MG PO CAPS
300.0000 mg | ORAL_CAPSULE | Freq: Two times a day (BID) | ORAL | 0 refills | Status: DC
  Filled 2023-09-16: qty 20, 10d supply, fill #0

## 2023-09-17 ENCOUNTER — Other Ambulatory Visit (INDEPENDENT_AMBULATORY_CARE_PROVIDER_SITE_OTHER)

## 2023-09-17 ENCOUNTER — Other Ambulatory Visit

## 2023-09-17 DIAGNOSIS — E538 Deficiency of other specified B group vitamins: Secondary | ICD-10-CM

## 2023-09-17 LAB — VITAMIN B12: Vitamin B-12: 312 pg/mL (ref 211–911)

## 2023-09-18 ENCOUNTER — Ambulatory Visit: Payer: Self-pay | Admitting: Physician Assistant

## 2023-09-18 ENCOUNTER — Ambulatory Visit: Admitting: Internal Medicine

## 2023-09-21 ENCOUNTER — Encounter: Payer: Self-pay | Admitting: Internal Medicine

## 2023-09-21 ENCOUNTER — Ambulatory Visit (INDEPENDENT_AMBULATORY_CARE_PROVIDER_SITE_OTHER): Admitting: Internal Medicine

## 2023-09-21 ENCOUNTER — Other Ambulatory Visit (HOSPITAL_BASED_OUTPATIENT_CLINIC_OR_DEPARTMENT_OTHER): Payer: Self-pay

## 2023-09-21 VITALS — BP 136/70 | HR 66 | Temp 97.2°F | Resp 16 | Ht 64.0 in | Wt 158.9 lb

## 2023-09-21 DIAGNOSIS — J3089 Other allergic rhinitis: Secondary | ICD-10-CM

## 2023-09-21 MED ORDER — LEVOCETIRIZINE DIHYDROCHLORIDE 5 MG PO TABS
5.0000 mg | ORAL_TABLET | Freq: Every evening | ORAL | 3 refills | Status: AC
  Filled 2023-09-21 – 2023-11-15 (×2): qty 90, 90d supply, fill #0
  Filled 2024-02-18: qty 90, 90d supply, fill #1
  Filled 2024-05-23: qty 90, 90d supply, fill #2

## 2023-09-21 MED ORDER — MOMETASONE FUROATE 50 MCG/ACT NA SUSP
1.0000 | Freq: Every day | NASAL | 3 refills | Status: AC | PRN
  Filled 2023-09-21: qty 51, 90d supply, fill #0
  Filled 2023-10-12: qty 17, 30d supply, fill #0
  Filled 2023-11-15: qty 17, 30d supply, fill #1
  Filled 2023-12-17: qty 17, 30d supply, fill #2
  Filled 2024-01-12: qty 17, 30d supply, fill #3
  Filled 2024-02-12: qty 17, 30d supply, fill #4
  Filled 2024-03-14: qty 17, 30d supply, fill #5
  Filled 2024-04-25: qty 17, 30d supply, fill #6
  Filled 2024-05-23: qty 17, 30d supply, fill #7

## 2023-09-21 NOTE — Patient Instructions (Addendum)
 Allergic rhinitis-controlled Continue levocetirizine 5 mg once a day as needed for runny nose or itch. Okay to take half dose if too dry.  Continue Mometasone  1-2 sprays in each nostril once a day if needed for a stuffy nose.  In the right nostril, point the applicator out toward the right ear. In the left nostril, point the applicator out toward the left ear-switching from Nasonex  Continue saline nasal rinses as needed for nasal symptoms. Use this before any medicated nasal sprays for best result. Use as often as needed. Consider nasal saline gel as needed for dry or irritated nostrils   Call the clinic if this treatment plan is not working well for you  Follow up: 12 months   Thank you so much for letting me partake in your care today.  Don't hesitate to reach out if you have any additional concerns!  Orelia Binet, MD  [facility] [place], [place]

## 2023-09-21 NOTE — Progress Notes (Signed)
 FOLLOW UP Date of Service/Encounter:  09/21/23   Subjective:  Victoria Walker (DOB: Feb 20, 1951) is a 73 y.o. female who [facility] in re-evaluation of the following: allergic rhinitis History obtained from: chart review and patient.  For Review, LV was on 09/18/22  with Dr.Dennis seen for routine follow-up. See below for summary of history and diagnostics.  Therapeutic plans/changes recommended: we made no changes to her allergy plan as she was doing well.   Pertinent History/Diagnostics:  Allergic Rhinitis:   controlled on levocetirizine 5 mg once a day, mometasone  nasal spray daily, and occasional nasal saline rinses.  Has open angle glaucoma. -------------------------------------- Today presents for her yearly follow-up. Overall she has been doing well.  She continues to control rhinitis with xyzal  5mg , sometimes decreasing to 2.5mg  if her dry mouth get worse.  If she doesn't take xyzal  she has rapid recurrence of itchy nose and PND.  She is also using mometasone  as needed (few times per week) for congestion.   No other concerns today    Allergies as of 09/21/2023       Reactions   Paxlovid  [nirmatrelvir -ritonavir ]    Codeine Anxiety   Jittery    Penicillins Itching   Sulfa Antibiotics Itching   Vicodin [hydrocodone -acetaminophen ] Itching   ITCHING         Medication List        Accurate as of September 21, 2023 12:22 PM. If you have any questions, ask your nurse or doctor.          STOP taking these medications    cetirizine  HCl 1 MG/ML solution Commonly known as: ZYRTEC  Stopped by: Orelia Binet   clindamycin  150 MG capsule Commonly known as: CLEOCIN  Stopped by: Orelia Binet       TAKE these medications    acetaminophen  325 MG tablet Commonly known as: Tylenol  Take 2 tablets (650 mg total) by mouth every 6 (six) hours as needed for moderate pain (pain score 4-6).   alendronate  70 MG tablet Commonly  known as: Fosamax  Take 1 tablet (70 mg total) by mouth once a week. Take with a full glass of water  on an empty stomach.   aspirin  EC 81 MG tablet Take 81 mg by mouth every other day. Swallow whole.   atorvastatin  40 MG tablet Commonly known as: LIPITOR Take 1 tablet (40 mg total) by mouth every other day alternating with 1/2 tablet.   azithromycin  500 MG tablet Commonly known as: ZITHROMAX  Take 2 tablets (1,000 mg total) by mouth daily for 1 dose. Take one hour prior to dental procedure.   cefdinir  300 MG capsule Commonly known as: OMNICEF  Take 1 capsule (300 mg total) by mouth 2 (two) times a day for 10 days.   Ciclopirox  1 % shampoo Apply 1(one) application(s) topically 2(two) times a week   diltiazem  360 MG 24 hr capsule Commonly known as: Tiadylt  ER Take 1 capsule (360 mg total) by mouth daily.   Estradiol  10 MCG Tabs vaginal tablet Place 1 tablet (10 mcg total) vaginally every other day.   Fiber 625 MG Tabs Take 1 tablet by mouth daily.   gabapentin  300 MG capsule Commonly known as: NEURONTIN  Take 1 capsule (300 mg total) by mouth at bedtime.   glucose blood test strip Commonly known as: ONE TOUCH TEST STRIPS Use as directed twice daily to check blood sugar. DX code E11.9   irbesartan  75 MG tablet Commonly known as: Avapro  Take 1 tablet (75  mg total) by mouth daily.   levocetirizine 5 MG tablet Commonly known as: XYZAL  Take 1 tablet (5 mg total) by mouth every evening.   levothyroxine  88 MCG tablet Commonly known as: SYNTHROID  Take 1 tablet (88 mcg total) by mouth daily. **SKIP SUNDAY**   loteprednol  0.5 % ophthalmic suspension Commonly known as: LOTEMAX  Place 1 drop into both eyes 4 (four) times daily as needed with flare up   loteprednol  0.5 % ophthalmic suspension Commonly known as: LOTEMAX  Place 1 drop into both eyes 4 (four) times daily with a flare up.   metFORMIN  750 MG 24 hr tablet Commonly known as: GLUCOPHAGE -XR Take 1 tablet (750 mg total)  by mouth in the morning and at bedtime.   metoprolol  succinate 25 MG 24 hr tablet Commonly known as: TOPROL -XL Take 1 tablet (25 mg total) by mouth 2 (two) times daily.   mometasone  50 MCG/ACT nasal spray Commonly known as: NASONEX  Place 1-2 sprays in each nostril daily as needed for stuffy nose.   NexIUM  40 MG capsule Generic drug: esomeprazole  Take 1 capsule (40 mg total) by mouth daily.   OS-CAL 500 + D PO   PROBIOTIC DAILY PO Take 1 capsule by mouth daily.   REFRESH OPTIVE OP Apply to eye.   Restasis  0.05 % ophthalmic emulsion Generic drug: cycloSPORINE  Place 1 drop into both eyes 2 (two) times daily.   cycloSPORINE  0.05 % ophthalmic emulsion Commonly known as: RESTASIS  Place 1 drop into both eyes 2 (two) times daily.   spironolactone  25 MG tablet Commonly known as: ALDACTONE  Take 1.5 tablets (37.5 mg total) by mouth daily.   Vitamin D  50 MCG (2000 UT) Caps Take 1 capsule by mouth every evening.       Past Medical History:  Diagnosis Date   Arthritis    "both knees" (07/02/2015)   Cancer of right breast (HCC)    Claustrophobia    Dysrhythmia    HX OF FAST HEART RATE AND PALPITATIONS - METOPROLOL  HAS HELPED   GERD (gastroesophageal reflux disease)    H/O hiatal hernia    H/O iritis    LEFT EYE - STATES HER EYE BECOMES RED AND VERY SENSITIVE TO LIGHT WHEN FLARE UP OF IRITIS   Heart murmur    "benign; 06/2015"   High cholesterol    History of blood transfusion    "when I had my partial hysterectomy"   History of chickenpox 09/26/2014   History of measles    History of MRSA infection    History of shingles    Hypertension    Hypothyroidism    Medicare annual wellness visit, initial 04/07/2015   Multinodular thyroid     PT HAVING TROUBLE SWALLOWING   Neuropathy    FEET   Primary localized osteoarthritis of right knee    Sciatica of right side 04/25/2015   Spinal headache    spinal headache with epidural   Total knee replacement status, right    Type II  diabetes mellitus (HCC)    ORAL MEDICATION - NO INSULIN    Wears glasses    Past Surgical History:  Procedure Laterality Date   ABDOMINAL HYSTERECTOMY  1988   ABDOMINOPLASTY  1986   ANTERIOR AND POSTERIOR REPAIR  05/11/2012   Procedure: ANTERIOR (CYSTOCELE) AND POSTERIOR REPAIR (RECTOCELE);  Surgeon: Devorah Fonder, MD;  Location: [facility];  Service: Urology;;  with graft   BREAST BIOPSY Right    CARDIAC CATHETERIZATION  1999   COLONOSCOPY     CYSTOSCOPY WITH URETHRAL DILATATION  05/11/2012   Procedure: CYSTOSCOPY WITH URETHRAL DILATATION;  Surgeon: Devorah Fonder, MD;  Location: [facility];  Service: Urology;;   ESOPHAGEAL MANOMETRY N/A 10/09/2021   Procedure: ESOPHAGEAL MANOMETRY (EM);  Surgeon: Normie Becton., MD;  Location: [facility];  Service: Gastroenterology;  Laterality: N/A;   ESOPHAGOGASTRODUODENOSCOPY     HERNIA REPAIR     MASTECTOMY Right 2007   PLANTAR FASCIA SURGERY Left    THYROIDECTOMY N/A 02/23/2014   Procedure: TOTAL THYROIDECTOMY;  Surgeon: Oralee Billow, MD;  Location: [facility];  Service: General;  Laterality: N/A;   TOTAL KNEE ARTHROPLASTY Right 07/02/2015   TOTAL KNEE ARTHROPLASTY Right 07/02/2015   Procedure: TOTAL KNEE ARTHROPLASTY;  Surgeon: Elly Habermann, MD;  [facility];  Service: Orthopedics;  Laterality: Right;   TOTAL THYROIDECTOMY  1976   removed three tumor (2 on right; 1 on left)   TUBAL LIGATION     UMBILICAL HERNIA REPAIR  1986   VAGINAL PROLAPSE REPAIR  05/11/2012   Procedure: VAGINAL VAULT SUSPENSION;  Surgeon: Devorah Fonder, MD;  Location: [facility];  Service: Urology;;   VULVA / PERINEUM BIOPSY     vulvar   Otherwise, there have been no changes to her past medical history, surgical history, family history, or social history.  ROS: All others negative except as noted per HPI.   Objective:  BP 136/70 (BP Location: Left Arm, Patient Position: Sitting)   Pulse 66   Temp (!) 97.2 F (36.2 C) (Temporal)   Resp 16   Ht 5\' 4"  (1.626  m)   Wt 158 lb 14.4 oz (72.1 kg)   SpO2 99%   BMI 27.28 kg/m  Body mass index is 27.28 kg/m. Physical Exam: General Appearance:  Alert, cooperative, no distress, appears stated age  Head:  Normocephalic, without obvious abnormality, atraumatic  Eyes:  Conjunctiva clear, EOM's intact  Nose: Nares normal, hypertrophic turbinates and normal mucosa  Throat: Lips, tongue normal; teeth and gums normal, normal posterior oropharynx  Neck: Supple, symmetrical  Lungs:   clear to auscultation bilaterally, Respirations unlabored, no coughing  Heart:  regular rate and rhythm and no murmur, Appears well perfused  Extremities: No edema  Skin: Skin color, texture, turgor normal and no rashes or lesions on visualized portions of skin  Neurologic: No gross deficits   Assessment/Plan   Patient Instructions  Allergic rhinitis-controlled Continue levocetirizine 5 mg once a day as needed for runny nose or itch. Okay to take half dose if too dry.  Continue Mometasone  1-2 sprays in each nostril once a day if needed for a stuffy nose.  In the right nostril, point the applicator out toward the right ear. In the left nostril, point the applicator out toward the left ear-switching from Nasonex  Continue saline nasal rinses as needed for nasal symptoms. Use this before any medicated nasal sprays for best result. Use as often as needed. Consider nasal saline gel as needed for dry or irritated nostrils   Call the clinic if this treatment plan is not working well for you  Follow up: 12 months   Thank you so much for letting me partake in your care today.  Don't hesitate to reach out if you have any additional concerns!  Orelia Binet, MD  [facility] [place], [place]

## 2023-09-25 ENCOUNTER — Encounter: Payer: Self-pay | Admitting: Cardiology

## 2023-09-25 ENCOUNTER — Ambulatory Visit: Attending: Cardiology

## 2023-09-25 ENCOUNTER — Other Ambulatory Visit (HOSPITAL_BASED_OUTPATIENT_CLINIC_OR_DEPARTMENT_OTHER): Payer: Self-pay

## 2023-09-25 ENCOUNTER — Ambulatory Visit: Attending: Cardiology | Admitting: Cardiology

## 2023-09-25 VITALS — BP 126/72 | HR 65 | Ht 64.0 in | Wt 159.8 lb

## 2023-09-25 DIAGNOSIS — R002 Palpitations: Secondary | ICD-10-CM

## 2023-09-25 DIAGNOSIS — R011 Cardiac murmur, unspecified: Secondary | ICD-10-CM

## 2023-09-25 DIAGNOSIS — I10 Essential (primary) hypertension: Secondary | ICD-10-CM | POA: Diagnosis not present

## 2023-09-25 MED ORDER — BLOOD PRESSURE MONITOR AUTOMAT DEVI
1.0000 [IU] | Freq: Once | 0 refills | Status: AC
  Filled 2023-09-25: qty 1, 30d supply, fill #0

## 2023-09-25 NOTE — Addendum Note (Signed)
 Addended by: Jeneen Mire on: 09/25/2023 11:08 AM   Modules accepted: Orders

## 2023-09-25 NOTE — Patient Instructions (Addendum)
 Medication Instructions:  Your physician has recommended you make the following change in your medication:  STOP: Diltiazem   *If you need a refill on your cardiac medications before your next appointment, please call your pharmacy*   Testing/Procedures: Your physician has requested that you have an echocardiogram. Echocardiography is a painless test that uses sound waves to create images of your heart. It provides your doctor with information about the size and shape of your heart and how well your heart's chambers and valves are working. This procedure takes approximately one hour. There are no restrictions for this procedure. Please do NOT wear cologne, perfume, aftershave, or lotions (deodorant is allowed). Please arrive 15 minutes prior to your appointment time.  Please note: We ask at that you not bring children with you during ultrasound (echo/ vascular) testing. Due to room size and safety concerns, children are not allowed in the ultrasound rooms during exams. Our front office staff cannot provide observation of children in our lobby area while testing is being conducted. An adult accompanying a patient to their appointment will only be allowed in the ultrasound room at the discretion of the ultrasound technician under special circumstances. We apologize for any inconvenience.  ZIO XT- Long Term Monitor Instructions  Your physician has requested you wear a ZIO patch monitor for 14 days.  This is a single patch monitor. [employer] supplies one patch monitor per enrollment. Additional stickers are not available. Please do not apply patch if you will be having a Nuclear Stress Test,  Echocardiogram, Cardiac CT, MRI, or Chest Xray during the period you would be wearing the  monitor. The patch cannot be worn during these tests. You cannot remove and re-apply the  ZIO XT patch monitor.  Your ZIO patch monitor will be mailed 3 day USPS to your address on file. It may take 3-5 days  to receive  your monitor after you have been enrolled.  Once you have received your monitor, please review the enclosed instructions. Your monitor  has already been registered assigning a specific monitor serial # to you.  Billing and Patient Assistance Program Information  We have supplied [employer] with any of your insurance information on file for billing purposes. [employer] offers a sliding scale Patient Assistance Program for patients that do not have  insurance, or whose insurance does not completely cover the cost of the ZIO monitor.  You must apply for the Patient Assistance Program to qualify for this discounted rate.  To apply, please call [employer] at [phone], select option 4, select option 2, ask to apply for  Patient Assistance Program. Sanna Crystal will ask your household income, and how many people  are in your household. They will quote your out-of-pocket cost based on that information.  [employer] will also be able to set up a 39-month, interest-free payment plan if needed.  Applying the monitor: Do not shower for the first 24 hours. You may shower after the first 24 hours.  Press the button if you feel a symptom. You will hear a small click. Record Date, Time and  Symptom in the Patient Logbook.  When you are ready to remove the patch, follow instructions on the last 2 pages of Patient  Logbook. Stick patch monitor onto the last page of Patient Logbook.  Place Patient Logbook in the blue and white box. Use locking tab on box and tape box closed  securely. The blue and white box has prepaid postage on it. Please place it in the mailbox as  soon  as possible. Your physician should have your test results approximately 7 days after the  monitor has been mailed back to [facility].  Call [facility] Customer Care at [phone] if you have questions regarding  your ZIO XT patch monitor. Call them immediately if you see an orange light blinking on your  monitor.  If your monitor falls off  in less than 4 days, contact our Monitor department at [phone].  If your monitor becomes loose or falls off after 4 days call [employer] at [phone] for  suggestions on securing your monitor   Follow-Up: At [facility], you and your health needs are our priority.  As part of our continuing mission to provide you with exceptional heart care, our providers are all part of one team.  This team includes your primary Cardiologist (physician) and Advanced Practice Providers or APPs (Physician Assistants and Nurse Practitioners) who all work together to provide you with the care you need, when you need it.  Your next appointment:   6 month(s)  Provider:   Hillis Lu, MD    Other Instructions Please take your blood pressure daily for 2 weeks and send in a MyChart message. Please include heart rates. (One message at the end of the 2 weeks).   HOW TO TAKE YOUR BLOOD PRESSURE: Rest 5 minutes before taking your blood pressure. Don't smoke or drink caffeinated beverages for at least 30 minutes before. Take your blood pressure before (not after) you eat. Sit comfortably with your back supported and both feet on the floor (don't cross your legs). Elevate your arm to heart level on a table or a desk. Use the proper sized cuff. It should fit smoothly and snugly around your bare upper arm. There should be enough room to slip a fingertip under the cuff. The bottom edge of the cuff should be 1 inch above the crease of the elbow. Ideally, take 3 measurements at one sitting and record the average.

## 2023-09-25 NOTE — Progress Notes (Signed)
 Cardiology Office Note:    Date:  09/25/2023   ID:  Victoria Walker, DOB [birth year], MRN [id number]  PCP:  Neda Balk, MD  Cardiologist:  Nelia Balzarine, MD   Referring MD: Trenton Frock, PA-C    ASSESSMENT:    1. Essential hypertension    PLAN:    In order of problems listed above:  Primary prevention stressed with the patient.  Importance of compliance with diet medication stressed and patient verbalized standing. Essential hypertension: Patient is on multiple blood pressure medications.  Her blood pressure is borderline and she has brought multiple home recordings.  I told her to stop diltiazem  at this time.  She will reinitiate irbesartan .  She is keeping a meticulous track of her vital signs and she will bring that for me back in in 2 weeks for review. Cardiac murmur: Echocardiogram will be done to assess murmur heard on auscultation. Mixed dyslipidemia: On lipid-lowering medications by primary care.  Lipids reviewed and discussed with the patient. Diabetes mellitus: Managed by primary care.  Diet emphasized. Patient will be seen in follow-up appointment in 6 months or earlier if the patient has any concerns.    Medication Adjustments/Labs and Tests Ordered: Current medicines are reviewed at length with the patient today.  Concerns regarding medicines are outlined above.  Orders Placed This Encounter  Procedures   EKG 12-Lead   No orders of the defined types were placed in this encounter.    History of Present Illness:    Victoria Walker is a 73 y.o. female who is being seen today for the evaluation of low blood pressure at the request of Trenton Frock, PA-C.  Patient is a pleasant 73 year old female.  She has past medical history of essential hypertension, mixed dyslipidemia.  She is an active lady.  She has orthopedic issues causing problems with ambulation.  She denies any chest pain orthopnea or PND.  She mentions to me that her blood pressure is  running low so she came here for evaluation.  At the time of my evaluation, the patient is alert awake oriented and in no distress.  Over the past several days she is hide irbesartan  per recommendations of primary care.  She denies any history of coronary artery disease or diabetes mellitus.  Past Medical History:  Diagnosis Date   Arthritis    "both knees" (07/02/2015)   Cancer of right breast (HCC)    Claustrophobia    Dysrhythmia    HX OF FAST HEART RATE AND PALPITATIONS - METOPROLOL  HAS HELPED   GERD (gastroesophageal reflux disease)    H/O hiatal hernia    H/O iritis    LEFT EYE - STATES HER EYE BECOMES RED AND VERY SENSITIVE TO LIGHT WHEN FLARE UP OF IRITIS   Heart murmur    "benign; 06/2015"   High cholesterol    History of blood transfusion    "when I had my partial hysterectomy"   History of chickenpox 09/26/2014   History of measles    History of MRSA infection    History of shingles    Hypertension    Hypothyroidism    Medicare annual wellness visit, initial 04/07/2015   Multinodular thyroid     PT HAVING TROUBLE SWALLOWING   Neuropathy    FEET   Primary localized osteoarthritis of right knee    Sciatica of right side 04/25/2015   Spinal headache    spinal headache with epidural   Total knee replacement status, right    Type  II diabetes mellitus (HCC)    ORAL MEDICATION - NO INSULIN    Wears glasses     Past Surgical History:  Procedure Laterality Date   ABDOMINAL HYSTERECTOMY  1988   ABDOMINOPLASTY  1986   ANTERIOR AND POSTERIOR REPAIR  05/11/2012   Procedure: ANTERIOR (CYSTOCELE) AND POSTERIOR REPAIR (RECTOCELE);  Surgeon: Devorah Fonder, MD;  Location: [facility];  Service: Urology;;  with graft   BREAST BIOPSY Right    CARDIAC CATHETERIZATION  1999   COLONOSCOPY     CYSTOSCOPY WITH URETHRAL DILATATION  05/11/2012   Procedure: CYSTOSCOPY WITH URETHRAL DILATATION;  Surgeon: Devorah Fonder, MD;  Location: [facility];  Service: Urology;;   ESOPHAGEAL MANOMETRY N/A  10/09/2021   Procedure: ESOPHAGEAL MANOMETRY (EM);  Surgeon: Normie Becton., MD;  Location: [facility];  Service: Gastroenterology;  Laterality: N/A;   ESOPHAGOGASTRODUODENOSCOPY     HERNIA REPAIR     MASTECTOMY Right 2007   PLANTAR FASCIA SURGERY Left    THYROIDECTOMY N/A 02/23/2014   Procedure: TOTAL THYROIDECTOMY;  Surgeon: Oralee Billow, MD;  Location: [facility];  Service: General;  Laterality: N/A;   TOTAL KNEE ARTHROPLASTY Right 07/02/2015   TOTAL KNEE ARTHROPLASTY Right 07/02/2015   Procedure: TOTAL KNEE ARTHROPLASTY;  Surgeon: Elly Habermann, MD;  Location: [facility] OR;  Service: Orthopedics;  Laterality: Right;   TOTAL THYROIDECTOMY  1976   removed three tumor (2 on right; 1 on left)   TUBAL LIGATION     UMBILICAL HERNIA REPAIR  1986   VAGINAL PROLAPSE REPAIR  05/11/2012   Procedure: VAGINAL VAULT SUSPENSION;  Surgeon: Devorah Fonder, MD;  Location: [facility];  Service: Urology;;   VULVA / PERINEUM BIOPSY     vulvar    Current Medications: Current Meds  Medication Sig   acetaminophen  (TYLENOL ) 325 MG tablet Take 2 tablets (650 mg total) by mouth every 6 (six) hours as needed for moderate pain (pain score 4-6).   alendronate  (FOSAMAX ) 70 MG tablet Take 1 tablet (70 mg total) by mouth once a week. Take with a full glass of water  on an empty stomach.   aspirin  81 MG EC tablet Take 81 mg by mouth every other day. Swallow whole.   atorvastatin  (LIPITOR) 40 MG tablet Take 1 tablet (40 mg total) by mouth every other day alternating with 1/2 tablet.   Calcium  Carbonate-Vitamin D  (OS-CAL 500 + D PO)    Calcium  Polycarbophil (FIBER) 625 MG TABS Take 1 tablet by mouth daily.   Carboxymethylcellul-Glycerin (REFRESH OPTIVE OP) Apply to eye.   cefdinir  (OMNICEF ) 300 MG capsule Take 1 capsule (300 mg total) by mouth 2 (two) times a day for 10 days.   Cholecalciferol (VITAMIN D ) 2000 UNITS CAPS Take 1 capsule by mouth every evening.    Ciclopirox  1 % shampoo Apply 1(one) application(s) topically  2(two) times a week   cycloSPORINE  (RESTASIS ) 0.05 % ophthalmic emulsion Place 1 drop into both eyes 2 (two) times daily.   diltiazem  (TIADYLT  ER) 360 MG 24 hr capsule Take 1 capsule (360 mg total) by mouth daily.   Estradiol  10 MCG TABS vaginal tablet Place 1 tablet (10 mcg total) vaginally every other day.   gabapentin  (NEURONTIN ) 300 MG capsule Take 1 capsule (300 mg total) by mouth at bedtime.   glucose blood (ONE TOUCH TEST STRIPS) test strip Use as directed twice daily to check blood sugar. DX code E11.9   irbesartan  (AVAPRO ) 75 MG tablet Take 1 tablet (75 mg total) by mouth daily.  levocetirizine (XYZAL ) 5 MG tablet Take 1 tablet (5 mg total) by mouth every evening.   levothyroxine  (SYNTHROID ) 88 MCG tablet Take 1 tablet (88 mcg total) by mouth daily. **SKIP SUNDAY**   loteprednol  (LOTEMAX ) 0.5 % ophthalmic suspension Place 1 drop into both eyes 4 (four) times daily with a flare up.   metFORMIN  (GLUCOPHAGE -XR) 750 MG 24 hr tablet Take 1 tablet (750 mg total) by mouth in the morning and at bedtime.   metoprolol  succinate (TOPROL -XL) 25 MG 24 hr tablet Take 1 tablet (25 mg total) by mouth 2 (two) times daily.   mometasone  (NASONEX ) 50 MCG/ACT nasal spray Place 1-2 sprays in each nostril daily as needed for stuffy nose.   NEXIUM  40 MG capsule Take 1 capsule (40 mg total) by mouth daily.   Probiotic Product (PROBIOTIC DAILY PO) Take 1 capsule by mouth daily.   spironolactone  (ALDACTONE ) 25 MG tablet Take 1.5 tablets (37.5 mg total) by mouth daily.   [DISCONTINUED] azithromycin  (ZITHROMAX ) 500 MG tablet Take 2 tablets (1,000 mg total) by mouth daily for 1 dose. Take one hour prior to dental procedure.   [DISCONTINUED] loteprednol  (LOTEMAX ) 0.5 % ophthalmic suspension Place 1 drop into both eyes 4 (four) times daily as needed with flare up     Allergies:   Paxlovid  [nirmatrelvir -ritonavir ], Codeine, Penicillins, Sulfa antibiotics, and Vicodin [hydrocodone -acetaminophen ]   Social History    Socioeconomic History   Marital status: Married    Spouse name: Not on file   Number of children: 3   Years of education: Not on file   Highest education level: Not on file  Occupational History    Comment: Retired  Tobacco Use   Smoking status: Never   Smokeless tobacco: Never  Vaping Use   Vaping status: Never Used  Substance and Sexual Activity   Alcohol  use: Not Currently   Drug use: Not Currently   Sexual activity: Not Currently    Birth control/protection: Surgical    Comment: lives with husband, retired from various jobs, diabetic diet  Other Topics Concern   Not on file  Social History Narrative   Not on file   Social Drivers of Health   Financial Resource Strain: Low Risk  (11/13/2022)   Overall Financial Resource Strain (CARDIA)    Difficulty of Paying Living Expenses: Not hard at all  Food Insecurity: No Food Insecurity (02/25/2023)   Hunger Vital Sign    Worried About Running Out of Food in the Last Year: Never true    Ran Out of Food in the Last Year: Never true  Transportation Needs: No Transportation Needs (02/25/2023)   PRAPARE - Administrator, Civil Service (Medical): No    Lack of Transportation (Non-Medical): No  Physical Activity: Inactive (12/03/2022)   Exercise Vital Sign    Days of Exercise per Week: 0 days    Minutes of Exercise per Session: 0 min  Stress: No Stress Concern Present (11/13/2022)   [employer] of Occupational Health - Occupational Stress Questionnaire    Feeling of Stress : Only a little  Social Connections: Socially Integrated (12/03/2022)   Social Connection and Isolation Panel [NHANES]    Frequency of Communication with Friends and Family: More than three times a week    Frequency of Social Gatherings with Friends and Family: Once a week    Attends Religious Services: More than 4 times per year    Active Member of [employer] or Organizations: Yes    Attends Banker Meetings: More  than 4 times per year     Marital Status: Married     Family History: The patient's family history includes Alcohol  abuse in her father; Arthritis in her sister and son; Benign prostatic hyperplasia in her brother; Breast cancer in her sister and sister; Cancer in her paternal aunt; Diabetes in her brother, daughter, father, sister, and sister; Endometriosis in her sister; Heart disease in her maternal grandmother and mother; Hyperlipidemia in her brother, brother, brother, daughter, mother, and sister; Hypertension in her brother, brother, brother, daughter, daughter, father, maternal aunt, maternal grandfather, maternal grandmother, maternal uncle, mother, paternal aunt, paternal uncle, sister, sister, and sister; Kidney Stones in her mother; Kidney disease in her brother, mother, and sister; Lung cancer in her father; Stroke in her brother and maternal grandmother; Thyroid  cancer in her sister; Thyroid  disease in her sister and sister. There is no history of Colon cancer, Stomach cancer, Esophageal cancer, Pancreatic cancer, Liver disease, Rectal cancer, or Inflammatory bowel disease.  ROS:   Please see the history of present illness.    All other systems reviewed and are negative.  EKGs/Labs/Other Studies Reviewed:    The following studies were reviewed today:  EKG Interpretation Date/Time:  Friday September 25 2023 09:49:36 EDT Ventricular Rate:  60 PR Interval:  202 QRS Duration:  72 QT Interval:  418 QTC Calculation: 418 R Axis:   -28  Text Interpretation: Normal sinus rhythm Cannot rule out Anteroseptal infarct , age undetermined When compared with ECG of 13-Mar-2021 23:24, PREVIOUS ECG IS PRESENT Confirmed by Hillis Lu [phone]) on 09/25/2023 9:58:05 AM     Recent Labs: 06/15/2023: ALT 15; BUN 19; Creat 0.78; Hemoglobin 13.0; Platelets 296.0; Potassium 4.6; Sodium 139; TSH 0.49  Recent Lipid Panel    Component Value Date/Time   CHOL 158 06/15/2023 1216   TRIG 117 06/15/2023 1216   HDL 51 06/15/2023  1216   CHOLHDL 3.1 06/15/2023 1216   VLDL 37.6 12/31/2022 1349   LDLCALC 86 06/15/2023 1216   LDLDIRECT 107.0 07/01/2016 1628    Physical Exam:    VS:  BP 126/72   Pulse 65   Ht 5\' 4"  (1.626 m)   Wt 159 lb 12.8 oz (72.5 kg)   SpO2 98%   BMI 27.43 kg/m     Wt Readings from Last 3 Encounters:  09/25/23 159 lb 12.8 oz (72.5 kg)  09/21/23 158 lb 14.4 oz (72.1 kg)  08/18/23 158 lb 3.2 oz (71.8 kg)     GEN: Patient is in no acute distress HEENT: Normal NECK: No JVD; No carotid bruits LYMPHATICS: No lymphadenopathy CARDIAC: S1 S2 regular, 2/6 systolic murmur at the apex. RESPIRATORY:  Clear to auscultation without rales, wheezing or rhonchi  ABDOMEN: Soft, non-tender, non-distended MUSCULOSKELETAL:  No edema; No deformity  SKIN: Warm and dry NEUROLOGIC:  Alert and oriented x 3 PSYCHIATRIC:  Normal affect    Signed, Nelia Balzarine, MD  09/25/2023 9:59 AM    [facility]

## 2023-09-28 ENCOUNTER — Other Ambulatory Visit: Payer: Self-pay

## 2023-09-28 ENCOUNTER — Telehealth: Payer: Self-pay | Admitting: Cardiology

## 2023-09-28 DIAGNOSIS — E119 Type 2 diabetes mellitus without complications: Secondary | ICD-10-CM | POA: Diagnosis not present

## 2023-09-28 DIAGNOSIS — H40023 Open angle with borderline findings, high risk, bilateral: Secondary | ICD-10-CM | POA: Diagnosis not present

## 2023-09-28 DIAGNOSIS — H04123 Dry eye syndrome of bilateral lacrimal glands: Secondary | ICD-10-CM | POA: Diagnosis not present

## 2023-09-28 DIAGNOSIS — H25813 Combined forms of age-related cataract, bilateral: Secondary | ICD-10-CM | POA: Diagnosis not present

## 2023-09-28 NOTE — Telephone Encounter (Signed)
 Called the patient and clarified that she was taking 75 mg of irbesartan  and not 150 mg. She also stated that she was taking her blood pressure before taking her blood pressure medication. I explained that she should take her blood pressure medication first and then 2 hours after, she should check her blood pressure. I explained that this will help to tell us  if the medication is helping to lower her blood pressure. Patient verbalized understanding and had no further questions at this time.

## 2023-09-28 NOTE — Telephone Encounter (Signed)
 Pt c/o medication issue:  1. Name of Medication: irbesartan  (AVAPRO ) 75 MG tablet   2. How are you currently taking this medication (dosage and times per day)? 75 mg, Daily   3. Are you having a reaction (difficulty breathing--STAT)? No  4. What is your medication issue? Pt wants to know if she is supposed to be taking 75 or  150mg , requesting cb

## 2023-09-29 NOTE — Telephone Encounter (Signed)
 Spoke with pt who states that she feels her headache is because of the medication change. Advised that she needs to see her PCP to be safe as her BP is not elevated that should cause the type of pain she is having causing her to go to bed. Pt states that her PCP is calling. Pt verbalized understanding and had no additional questions.

## 2023-09-29 NOTE — Telephone Encounter (Signed)
 Left vm to return call.

## 2023-09-29 NOTE — Telephone Encounter (Signed)
 Pt c/o BP issue: STAT if pt c/o blurred vision, one-sided weakness or slurred speech.   1. What is your BP concern?  Patient is following up. Still having issues. BP is elevated causing severe headaches. Patient says she can hardly handle the pain.  2. Have you taken any BP medication today? Yes, at 10:00 AM  3. What are your last 5 BP readings? 5:15 AM: 138/74 73  4. Are you having any other symptoms (ex. Dizziness, headache, blurred vision, passed out)?  Severe headache

## 2023-09-30 ENCOUNTER — Ambulatory Visit (HOSPITAL_BASED_OUTPATIENT_CLINIC_OR_DEPARTMENT_OTHER)
Admission: RE | Admit: 2023-09-30 | Discharge: 2023-09-30 | Disposition: A | Discharge status: Home / Self Care | Source: Ambulatory Visit | Attending: Medical | Admitting: Medical

## 2023-09-30 ENCOUNTER — Other Ambulatory Visit (HOSPITAL_BASED_OUTPATIENT_CLINIC_OR_DEPARTMENT_OTHER): Payer: Self-pay

## 2023-09-30 ENCOUNTER — Ambulatory Visit (INDEPENDENT_AMBULATORY_CARE_PROVIDER_SITE_OTHER): Admitting: Medical

## 2023-09-30 VITALS — BP 122/78 | HR 78 | Temp 98.0°F | Resp 18 | Ht 64.0 in | Wt 156.0 lb

## 2023-09-30 DIAGNOSIS — R519 Headache, unspecified: Secondary | ICD-10-CM | POA: Diagnosis not present

## 2023-09-30 DIAGNOSIS — G4489 Other headache syndrome: Secondary | ICD-10-CM | POA: Diagnosis not present

## 2023-09-30 DIAGNOSIS — C50911 Malignant neoplasm of unspecified site of right female breast: Secondary | ICD-10-CM | POA: Diagnosis not present

## 2023-09-30 DIAGNOSIS — I6782 Cerebral ischemia: Secondary | ICD-10-CM | POA: Diagnosis not present

## 2023-09-30 MED ORDER — CYCLOBENZAPRINE HCL 5 MG PO TABS
5.0000 mg | ORAL_TABLET | Freq: Every evening | ORAL | 0 refills | Status: DC | PRN
  Filled 2023-09-30 (×2): qty 1, 1d supply, fill #0

## 2023-09-30 NOTE — Progress Notes (Signed)
 Subjective:    Patient ID: Victoria Walker, female    DOB: 03/26/1951, 73 y.o.   MRN: [id number]  HPI   Pt went to cardiologist office. See below.  Primary prevention stressed with the patient.  Importance of compliance with diet medication stressed and patient verbalized standing. Essential hypertension: Patient is on multiple blood pressure medications.  Her blood pressure is borderline and she has brought multiple home recordings.  I told her to stop diltiazem  at this time.  She will reinitiate irbesartan .  She is keeping a meticulous track of her vital signs and she will bring that for me back in in 2 weeks for review. Cardiac murmur: Echocardiogram will be done to assess murmur heard on auscultation. Mixed dyslipidemia: On lipid-lowering medications by primary care.  Lipids reviewed and discussed with the patient. Diabetes mellitus: Managed by primary care.  Diet emphasized. Patient will be seen in follow-up appointment in 6 months or earlier if the patient has any concerns.   Victoria Walker is a 73 year old female with hypertension who presents with severe headaches.  She has been experiencing severe headaches that began after her cardiologist adjusted her blood pressure medications. The headaches are described as 'thumping' and are primarily located in the occipital area, radiating to the center of her head. Initially, the pain was rated as 7 out of 10 but has since decreased to 2 out of 10. The headaches worsen with movement and are relieved by lying still. She has been taking Tylenol  for relief, which provides some benefit. No neck stiffness, no vomiting, no nausea or fever.   Her cardiologist recently discontinued Diltiazem  due to low blood pressure and restarted her on Irbesartan . Since stopping Diltiazem , her blood pressure has been fluctuating, initially dropping to 99 mmHg systolic, causing lightheadedness and dizziness. Her current blood pressure is stable at 122/78  mmHg.  She has a history of cardiac murmur and palpitations, for which she has been on Metoprolol . She was recently monitored with a Zio patch and is scheduled for an echocardiogram in July.  No vision changes, nausea, vomiting, slurred speech, tingling in her hands or feet, or balance issues. She has not experienced vertigo or a spinning sensation.  She reports being under stress managing her husband's condition and has experienced changes in her medication.   Review of Systems  Constitutional:  Negative for chills, fatigue and fever.  HENT:  Negative for congestion and ear discharge.   Respiratory:  Negative for cough, chest tightness and wheezing.   Cardiovascular:  Negative for chest pain and palpitations.  Gastrointestinal:  Negative for abdominal pain.  Genitourinary:  Negative for dysuria.  Musculoskeletal:  Negative for back pain and neck pain.       Trapezius myalgia, neck pain.  Skin:  Negative for rash.  Neurological:  Positive for headaches. Negative for dizziness, seizures, syncope, weakness and numbness.  Hematological:  Negative for adenopathy. Does not bruise/bleed easily.  Psychiatric/Behavioral:  Negative for behavioral problems, decreased concentration and dysphoric mood.     Past Medical History:  Diagnosis Date   Arthritis    both knees (07/02/2015)   Cancer of right breast (HCC)    Claustrophobia    Dysrhythmia    HX OF FAST HEART RATE AND PALPITATIONS - METOPROLOL  HAS HELPED   GERD (gastroesophageal reflux disease)    H/O hiatal hernia    H/O iritis    LEFT EYE - STATES HER EYE BECOMES RED AND VERY SENSITIVE TO LIGHT WHEN FLARE UP  OF IRITIS   Heart murmur    benign; 06/2015   High cholesterol    History of blood transfusion    when I had my partial hysterectomy   History of chickenpox 09/26/2014   History of measles    History of MRSA infection    History of shingles    Hypertension    Hypothyroidism    Medicare annual wellness visit, initial  04/07/2015   Multinodular thyroid     PT HAVING TROUBLE SWALLOWING   Neuropathy    FEET   Primary localized osteoarthritis of right knee    Sciatica of right side 04/25/2015   Spinal headache    spinal headache with epidural   Total knee replacement status, right    Type II diabetes mellitus (HCC)    ORAL MEDICATION - NO INSULIN    Wears glasses      Social History   Socioeconomic History   Marital status: Married    Spouse name: Not on file   Number of children: 3   Years of education: Not on file   Highest education level: Not on file  Occupational History    Comment: Retired  Tobacco Use   Smoking status: Never   Smokeless tobacco: Never  Vaping Use   Vaping status: Never Used  Substance and Sexual Activity   Alcohol  use: Not Currently   Drug use: Not Currently   Sexual activity: Not Currently    Birth control/protection: Surgical    Comment: lives with husband, retired from various jobs, diabetic diet  Other Topics Concern   Not on file  Social History Narrative   Not on file   Social Drivers of Health   Financial Resource Strain: Low Risk  (11/13/2022)   Overall Financial Resource Strain (CARDIA)    Difficulty of Paying Living Expenses: Not hard at all  Food Insecurity: No Food Insecurity (02/25/2023)   Hunger Vital Sign    Worried About Running Out of Food in the Last Year: Never true    Ran Out of Food in the Last Year: Never true  Transportation Needs: No Transportation Needs (02/25/2023)   PRAPARE - Administrator, Civil Service (Medical): No    Lack of Transportation (Non-Medical): No  Physical Activity: Inactive (12/03/2022)   Exercise Vital Sign    Days of Exercise per Week: 0 days    Minutes of Exercise per Session: 0 min  Stress: No Stress Concern Present (11/13/2022)   [employer] of Occupational Health - Occupational Stress Questionnaire    Feeling of Stress : Only a little  Social Connections: Socially Integrated (12/03/2022)    Social Connection and Isolation Panel [NHANES]    Frequency of Communication with Friends and Family: More than three times a week    Frequency of Social Gatherings with Friends and Family: Once a week    Attends Religious Services: More than 4 times per year    Active Member of [employer] or Organizations: Yes    Attends Banker Meetings: More than 4 times per year    Marital Status: Married  Catering manager Violence: Not At Risk (02/25/2023)   Humiliation, Afraid, Rape, and Kick questionnaire    Fear of Current or Ex-Partner: No    Emotionally Abused: No    Physically Abused: No    Sexually Abused: No    Past Surgical History:  Procedure Laterality Date   ABDOMINAL HYSTERECTOMY  1988   ABDOMINOPLASTY  1986   ANTERIOR AND POSTERIOR REPAIR  05/11/2012  Procedure: ANTERIOR (CYSTOCELE) AND POSTERIOR REPAIR (RECTOCELE);  Surgeon: Devorah Fonder, MD;  Location: [facility];  Service: Urology;;  with graft   BREAST BIOPSY Right    CARDIAC CATHETERIZATION  1999   COLONOSCOPY     CYSTOSCOPY WITH URETHRAL DILATATION  05/11/2012   Procedure: CYSTOSCOPY WITH URETHRAL DILATATION;  Surgeon: Devorah Fonder, MD;  Location: [facility];  Service: Urology;;   ESOPHAGEAL MANOMETRY N/A 10/09/2021   Procedure: ESOPHAGEAL MANOMETRY (EM);  Surgeon: Normie Becton., MD;  Location: [facility];  Service: Gastroenterology;  Laterality: N/A;   ESOPHAGOGASTRODUODENOSCOPY     HERNIA REPAIR     MASTECTOMY Right 2007   PLANTAR FASCIA SURGERY Left    THYROIDECTOMY N/A 02/23/2014   Procedure: TOTAL THYROIDECTOMY;  Surgeon: Oralee Billow, MD;  Location: [facility];  Service: General;  Laterality: N/A;   TOTAL KNEE ARTHROPLASTY Right 07/02/2015   TOTAL KNEE ARTHROPLASTY Right 07/02/2015   Procedure: TOTAL KNEE ARTHROPLASTY;  Surgeon: Elly Habermann, MD;  Location: [facility];  Service: Orthopedics;  Laterality: Right;   TOTAL THYROIDECTOMY  1976   removed three tumor (2 on right; 1 on left)   TUBAL LIGATION      UMBILICAL HERNIA REPAIR  1986   VAGINAL PROLAPSE REPAIR  05/11/2012   Procedure: VAGINAL VAULT SUSPENSION;  Surgeon: Devorah Fonder, MD;  Location: [facility];  Service: Urology;;   VULVA / PERINEUM BIOPSY     vulvar    Family History  Problem Relation Age of Onset   Hypertension Maternal Grandmother    Heart disease Maternal Grandmother    Stroke Maternal Grandmother    Hypertension Maternal Grandfather    Hypertension Father    Diabetes Father    Alcohol  abuse Father    Lung cancer Father        lung, psa   Hypertension Mother    Heart disease Mother        pacer, CHF   Hyperlipidemia Mother    Kidney disease Mother        1 kidney due to stone   Kidney Stones Mother    Diabetes Brother    Hypertension Brother    Hyperlipidemia Brother    Kidney disease Brother        dialysis   Stroke Brother    Hypertension Brother    Hyperlipidemia Brother    Hypertension Brother    Hyperlipidemia Brother    Benign prostatic hyperplasia Brother    Diabetes Sister    Hypertension Sister    Breast cancer Sister    Hyperlipidemia Sister    Hypertension Sister    Diabetes Sister    Thyroid  disease Sister    Kidney disease Sister    Endometriosis Sister    Breast cancer Sister        thyroid  and breast   Thyroid  cancer Sister    Arthritis Sister        back pain   Hypertension Sister    Thyroid  disease Sister    Hypertension Daughter    Hypertension Daughter    Hyperlipidemia Daughter    Diabetes Daughter    Arthritis Son    Hypertension Maternal Aunt    Hypertension Maternal Uncle    Hypertension Paternal Aunt    Cancer Paternal Aunt    Hypertension Paternal Uncle    Colon cancer Neg Hx    Stomach cancer Neg Hx    Esophageal cancer Neg Hx    Pancreatic cancer Neg Hx    Liver disease  Neg Hx    Rectal cancer Neg Hx    Inflammatory bowel disease Neg Hx     Allergies  Allergen Reactions   Paxlovid  [Nirmatrelvir -Ritonavir ]    Codeine Anxiety    Jittery     Penicillins Itching   Sulfa Antibiotics Itching   Vicodin [Hydrocodone -Acetaminophen ] Itching    ITCHING     Current Outpatient Medications on File Prior to Visit  Medication Sig Dispense Refill   acetaminophen  (TYLENOL ) 325 MG tablet Take 2 tablets (650 mg total) by mouth every 6 (six) hours as needed for moderate pain (pain score 4-6). 100 tablet 0   alendronate  (FOSAMAX ) 70 MG tablet Take 1 tablet (70 mg total) by mouth once a week. Take with a full glass of water  on an empty stomach. 12 tablet 3   aspirin  81 MG EC tablet Take 81 mg by mouth every other day. Swallow whole.     atorvastatin  (LIPITOR) 40 MG tablet Take 1 tablet (40 mg total) by mouth every other day alternating with 1/2 tablet. 180 tablet 1   Calcium  Carbonate-Vitamin D  (OS-CAL 500 + D PO)      Calcium  Polycarbophil (FIBER) 625 MG TABS Take 1 tablet by mouth daily.     Carboxymethylcellul-Glycerin (REFRESH OPTIVE OP) Apply to eye.     cefdinir  (OMNICEF ) 300 MG capsule Take 1 capsule (300 mg total) by mouth 2 (two) times a day for 10 days. 20 capsule 0   Cholecalciferol (VITAMIN D ) 2000 UNITS CAPS Take 1 capsule by mouth every evening.      Ciclopirox  1 % shampoo Apply 1(one) application(s) topically 2(two) times a week 120 mL 11   cycloSPORINE  (RESTASIS ) 0.05 % ophthalmic emulsion Place 1 drop into both eyes 2 (two) times daily. 180 each 3   Estradiol  10 MCG TABS vaginal tablet Place 1 tablet (10 mcg total) vaginally every other day. 48 tablet 4   gabapentin  (NEURONTIN ) 300 MG capsule Take 1 capsule (300 mg total) by mouth at bedtime. 90 capsule 1   glucose blood (ONE TOUCH TEST STRIPS) test strip Use as directed twice daily to check blood sugar. DX code E11.9 300 each 1   irbesartan  (AVAPRO ) 75 MG tablet Take 1 tablet (75 mg total) by mouth daily. 30 tablet 3   levocetirizine (XYZAL ) 5 MG tablet Take 1 tablet (5 mg total) by mouth every evening. 90 tablet 3   levothyroxine  (SYNTHROID ) 88 MCG tablet Take 1 tablet (88 mcg  total) by mouth daily. **SKIP SUNDAY** 90 tablet 1   loteprednol  (LOTEMAX ) 0.5 % ophthalmic suspension Place 1 drop into both eyes 4 (four) times daily with a flare up. 5 mL 3   metFORMIN  (GLUCOPHAGE -XR) 750 MG 24 hr tablet Take 1 tablet (750 mg total) by mouth in the morning and at bedtime. 180 tablet 1   metoprolol  succinate (TOPROL -XL) 25 MG 24 hr tablet Take 1 tablet (25 mg total) by mouth 2 (two) times daily. 180 tablet 1   mometasone  (NASONEX ) 50 MCG/ACT nasal spray Place 1-2 sprays in each nostril daily as needed for stuffy nose. 51 g 3   NEXIUM  40 MG capsule Take 1 capsule (40 mg total) by mouth daily. 90 capsule 1   Probiotic Product (PROBIOTIC DAILY PO) Take 1 capsule by mouth daily.     spironolactone  (ALDACTONE ) 25 MG tablet Take 1.5 tablets (37.5 mg total) by mouth daily. 135 tablet 1   No current facility-administered medications on file prior to visit.    BP 122/78   Pulse  78   Temp 98 F (36.7 C)   Resp 18   Ht 5' 4 (1.626 m)   Wt 156 lb (70.8 kg)   SpO2 94%   BMI 26.78 kg/m        Objective:   Physical Exam  General Mental Status- Alert. General Appearance- Not in acute distress.   Skin General: Color- Normal Color. Moisture- Normal Moisture.  Neck . No JVD. Trapezius tender to palpation where muscle insert into cranium  Chest and Lung Exam Auscultation: Breath Sounds:-Normal.  Cardiovascular Auscultation:Rythm- Regular. Murmurs & Other Heart Sounds:Auscultation of the heart reveals- No Murmurs.  Abdomen Inspection:-Inspeection Normal. Palpation/Percussion:Note:No mass. Palpation and Percussion of the abdomen reveal- Non Tender, Non Distended + BS, no rebound or guarding.    Neurologic Cranial Nerve exam:- CN III-XII intact(No nystagmus), symmetric smile. Drift Test:- No drift. Romberg Exam:- Negative.  Heal to Toe Gait exam:-Normal. Finger to Nose:- Normal/Intact Strength:- 5/5 equal and symmetric strength both upper and lower extremities.        Assessment & Plan:   Patient Instructions  Headache Severe occipital headache but now getting better.  Possible tension-related though level of ha initially and duration unsual for you . CT head considered due to new onset and prolonged nature. - Order CT head without contrast. Do study today - Prescribe Flexeril 5 mg at night with Tylenol . - Advise to seek emergency care if symptoms worsen. - Instruct to update on response to muscle relaxant(rx advisement given) .  Hypertension Hypertension managed with irbesartan  after diltiazem  discontinuation due to hypotension. Blood pressure stable at 122/78 mmHg. - Continue irbesartan  as prescribed. - Monitor blood pressure regularly.  Cardiac Murmur Cardiac murmur with palpitations under evaluation. Zio patch applied for monitoring. - Await Zio patch results. - Schedule echocardiogram in July.  Follow up date to be determined after ct scan review and update on ha level tomorrow morning.   Tabb Croghan, PA-C

## 2023-09-30 NOTE — Patient Instructions (Signed)
 Headache Severe occipital headache but now getting better.  Possible tension-related though level of ha initially and duration unsual for you . CT head considered due to new onset and prolonged nature. - Order CT head without contrast. Do study today - Prescribe Flexeril 5 mg at night with Tylenol . - Advise to seek emergency care if symptoms worsen. - Instruct to update on response to muscle relaxant(rx advisement given) .  Hypertension Hypertension managed with irbesartan  after diltiazem  discontinuation due to hypotension. Blood pressure stable at 122/78 mmHg. - Continue irbesartan  as prescribed. - Monitor blood pressure regularly.  Cardiac Murmur Cardiac murmur with palpitations under evaluation. Zio patch applied for monitoring. - Await Zio patch results. - Schedule echocardiogram in July.  Follow up date to be determined after ct scan review and update on ha level tomorrow morning.

## 2023-10-11 ENCOUNTER — Encounter: Payer: Self-pay | Admitting: Cardiology

## 2023-10-12 ENCOUNTER — Other Ambulatory Visit (HOSPITAL_BASED_OUTPATIENT_CLINIC_OR_DEPARTMENT_OTHER): Payer: Self-pay

## 2023-10-12 ENCOUNTER — Other Ambulatory Visit: Payer: Self-pay | Admitting: Family Medicine

## 2023-10-12 MED ORDER — NEXIUM 40 MG PO CPDR
40.0000 mg | DELAYED_RELEASE_CAPSULE | Freq: Every day | ORAL | 1 refills | Status: DC
  Filled 2023-10-12: qty 30, 30d supply, fill #0
  Filled 2023-11-15: qty 30, 30d supply, fill #1
  Filled 2023-12-17: qty 30, 30d supply, fill #2
  Filled 2024-01-12: qty 30, 30d supply, fill #3
  Filled 2024-02-12: qty 30, 30d supply, fill #4
  Filled 2024-03-14: qty 30, 30d supply, fill #5

## 2023-10-13 ENCOUNTER — Other Ambulatory Visit (HOSPITAL_BASED_OUTPATIENT_CLINIC_OR_DEPARTMENT_OTHER): Payer: Self-pay

## 2023-10-14 ENCOUNTER — Other Ambulatory Visit (HOSPITAL_BASED_OUTPATIENT_CLINIC_OR_DEPARTMENT_OTHER): Payer: Self-pay

## 2023-10-14 ENCOUNTER — Ambulatory Visit: Admitting: Obstetrics & Gynecology

## 2023-10-14 VITALS — BP 134/71 | HR 61 | Ht 65.0 in | Wt 162.0 lb

## 2023-10-14 DIAGNOSIS — B3731 Acute candidiasis of vulva and vagina: Secondary | ICD-10-CM

## 2023-10-14 DIAGNOSIS — Z1231 Encounter for screening mammogram for malignant neoplasm of breast: Secondary | ICD-10-CM

## 2023-10-14 DIAGNOSIS — Z1331 Encounter for screening for depression: Secondary | ICD-10-CM | POA: Diagnosis not present

## 2023-10-14 DIAGNOSIS — Z01419 Encounter for gynecological examination (general) (routine) without abnormal findings: Secondary | ICD-10-CM

## 2023-10-14 MED ORDER — FLUCONAZOLE 150 MG PO TABS
150.0000 mg | ORAL_TABLET | ORAL | 3 refills | Status: DC
  Filled 2023-10-14: qty 3, 9d supply, fill #0

## 2023-10-14 NOTE — Progress Notes (Signed)
 Subjective:     Victoria Walker is a 73 y.o. female here for a routine exam.  Current complaints: No current GYN complaints. H/o DM has occ genital yeast infxns. Uses Diflucan  when that occurs. Requests refill. H/o breast cancer.   Gynecologic History No LMP recorded. Patient has had a hysterectomy. Contraception: post menopausal status Last Pap: s/p hyst Last mammogram: 01/19/2023. Results were: normal per pt report   Obstetric History OB History  Gravida Para Term Preterm AB Living  3 3 3   3   SAB IAB Ectopic Multiple Live Births          # Outcome Date GA Lbr Len/2nd Weight Sex Type Anes PTL Lv  3 Term 24    F Vag-Spont     2 Term 1973    F Vag-Spont     1 Term 2    M Vag-Spont        The following portions of the patient's history were reviewed and updated as appropriate: allergies, current medications, past family history, past medical history, past social history, past surgical history, and problem list.  Review of Systems Pertinent items are noted in HPI.    Objective:  BP 134/71   Pulse 61   Ht 5' 5 (1.651 m)   Wt 162 lb (73.5 kg)   BMI 26.96 kg/m   General Appearance:    Alert, cooperative, no distress, appears stated age  Head:    Normocephalic, without obvious abnormality, atraumatic  Eyes:    conjunctiva/corneas clear, EOM's intact, both eyes  Ears:    Normal external ear canals, both ears  Nose:   Nares normal, septum midline, mucosa normal, no drainage    or sinus tenderness  Throat:   Lips, mucosa, and tongue normal; teeth and gums normal  Neck:   Supple, symmetrical, trachea midline, no adenopathy;    thyroid :  no enlargement/tenderness/nodules  Back:     Symmetric, no curvature, ROM normal, no CVA tenderness  Lungs:     respirations unlabored  Chest Wall:    No tenderness or deformity   Heart:    Regular rate and rhythm  Breast Exam:    No tenderness, masses, or nipple abnormality on left.  S/p mastectomy on the right side. NO masses or skin  changes noted.   Abdomen:     Soft, non-tender, bowel sounds active all four quadrants,    no masses, no organomegaly  Genitalia:    Normal female without lesion, discharge or tenderness   S/p surgical removal of uterus and cervix. No adnexal masses noted.   Extremities:   Extremities normal, atraumatic, no cyanosis or edema  Pulses:   2+ and symmetric all extremities  Skin:   Skin color, texture, turgor normal, no rashes or lesions     Assessment:    Healthy female exam.    Plan:   Victoria Walker was seen today for gynecologic exam.  Diagnoses and all orders for this visit:  Breast cancer screening by mammogram -     MM 3D SCREENING MAMMOGRAM BILATERAL BREAST; Future  Well female exam with routine gynecological exam  Yeast vaginitis -     fluconazole  (DIFLUCAN ) 150 MG tablet; Take 1 tablet (150 mg total) by mouth every 3 (three) days. For three doses  Other orders -     Discontinue: fluconazole  (DIFLUCAN ) 150 MG tablet; Take 1 tablet (150 mg total) by mouth every 3 (three) days. For three doses   F/u in 1 year or sooner prn  Victoria Walker, M.D., FACOG

## 2023-10-16 DIAGNOSIS — R002 Palpitations: Secondary | ICD-10-CM | POA: Diagnosis not present

## 2023-10-20 ENCOUNTER — Ambulatory Visit: Payer: Self-pay | Admitting: Medical

## 2023-10-21 ENCOUNTER — Other Ambulatory Visit (HOSPITAL_BASED_OUTPATIENT_CLINIC_OR_DEPARTMENT_OTHER): Payer: Self-pay

## 2023-10-21 ENCOUNTER — Other Ambulatory Visit: Payer: Self-pay

## 2023-10-21 ENCOUNTER — Ambulatory Visit: Attending: Cardiology | Admitting: Cardiology

## 2023-10-21 ENCOUNTER — Encounter: Payer: Self-pay | Admitting: Cardiology

## 2023-10-21 ENCOUNTER — Telehealth: Payer: Self-pay | Admitting: *Deleted

## 2023-10-21 ENCOUNTER — Ambulatory Visit: Admitting: Physician Assistant

## 2023-10-21 ENCOUNTER — Telehealth: Payer: Self-pay | Admitting: Cardiology

## 2023-10-21 VITALS — BP 140/70 | HR 88 | Ht 65.0 in | Wt 160.0 lb

## 2023-10-21 DIAGNOSIS — E1140 Type 2 diabetes mellitus with diabetic neuropathy, unspecified: Secondary | ICD-10-CM

## 2023-10-21 DIAGNOSIS — E782 Mixed hyperlipidemia: Secondary | ICD-10-CM | POA: Diagnosis not present

## 2023-10-21 DIAGNOSIS — I10 Essential (primary) hypertension: Secondary | ICD-10-CM

## 2023-10-21 MED ORDER — DILTIAZEM HCL ER COATED BEADS 120 MG PO CP24
120.0000 mg | ORAL_CAPSULE | Freq: Every day | ORAL | 3 refills | Status: DC
  Filled 2023-10-21: qty 90, 90d supply, fill #0

## 2023-10-21 NOTE — Telephone Encounter (Signed)
 Patient forgot to mention to Dr. Revankar that she would like a RX for Irbesartan  150mg . CB # [phone]

## 2023-10-21 NOTE — Telephone Encounter (Signed)
 Pt reports that she has been taking Irbesartan  150 mg daily. Can I refill the 150 mg ?

## 2023-10-21 NOTE — Patient Instructions (Addendum)
 Medication Instructions:  Your physician has recommended you make the following change in your medication:   STOP: Metoprolol  START: Cardizem  CD 120 mg daily  *If you need a refill on your cardiac medications before your next appointment, please call your pharmacy*  Lab Work: None If you have labs (blood work) drawn today and your tests are completely normal, you will receive your results only by: MyChart Message (if you have MyChart) OR A paper copy in the mail If you have any lab test that is abnormal or we need to change your treatment, we will call you to review the results.  Testing/Procedures: None  Follow-Up: At [facility], you and your health needs are our priority.  As part of our continuing mission to provide you with exceptional heart care, our providers are all part of one team.  This team includes your primary Cardiologist (physician) and Advanced Practice Providers or APPs (Physician Assistants and Nurse Practitioners) who all work together to provide you with the care you need, when you need it.  Your next appointment:   9 month(s)  Provider:   Jennifer Crape, MD    We recommend signing up for the patient portal called MyChart.  Sign up information is provided on this After Visit Summary.  MyChart is used to connect with patients for Virtual Visits (Telemedicine).  Patients are able to view lab/test results, encounter notes, upcoming appointments, etc.  Non-urgent messages can be sent to your provider as well.   To learn more about what you can do with MyChart, go to [email or web address].   Other Instructions Please keep a BP log for 2 weeks and send by MyChart or mail.                          Name and DOB__________________________ Dr. Crape [address] [place], [place] [address]  Blood Pressure Record Sheet To take your blood pressure, you will need a blood pressure machine. You can buy a blood pressure machine (blood pressure monitor) at your  clinic, drug store, or online. When choosing one, consider: An automatic monitor that has an arm cuff. A cuff that wraps snugly around your upper arm. You should be able to fit only one finger between your arm and the cuff. A device that stores blood pressure reading results. Do not choose a monitor that measures your blood pressure from your wrist or finger. Follow your health care provider's instructions for how to take your blood pressure. To use this form: Get one reading in the morning (a.m.) 1-2 hours after you take any medicines. Get one reading in the evening (p.m.) before supper.   Blood pressure log Date: _______________________  a.m. _____________________(1st reading) HR___________            p.m. _____________________(2nd reading) HR__________  Date: _______________________  a.m. _____________________(1st reading) HR___________            p.m. _____________________(2nd reading) HR__________  Date: _______________________  a.m. _____________________(1st reading) HR___________            p.m. _____________________(2nd reading) HR__________  Date: _______________________  a.m. _____________________(1st reading) HR___________            p.m. _____________________(2nd reading) HR__________  Date: _______________________  a.m. _____________________(1st reading) HR___________            p.m. _____________________(2nd reading) HR__________  Date: _______________________  a.m. _____________________(1st reading) HR___________            p.m. _____________________(2nd reading) HR__________  Date: _______________________  a.m. _____________________(1st reading) HR___________            p.m. _____________________(2nd reading) HR__________   This information is not intended to replace advice given to you by your health care provider. Make sure you discuss any questions you have with your health care provider. Document Revised: 07/27/2019 Document Reviewed:  07/27/2019 Elsevier Patient Education  2021 [employer].

## 2023-10-21 NOTE — Telephone Encounter (Signed)
 For Dr. Domenica only  Patient was seen by cardiologist.  She stated that she would like to see another provider because she felt like Dr. Jory did not listen to her at all.  She states that she takes 150mg  of irbesarten and they wrote that she was taking only 75mg .  I have made that change in the chart.  She would like a refill for 150mg  since that's what she has been taking.  She states that she knows her body and thinks she should be on the 150mg .  I advised that she can request another physician but know that they are the specialist and are able to change medications appropriately.  She states that she will call to request change.  Also advised that she still send in bp readings to them in 2 weeks.  She would like to have rx for the irbesartan  150mg  since that's what she has been taking.

## 2023-10-21 NOTE — Progress Notes (Signed)
 Cardiology Office Note:    Date:  10/21/2023   ID:  Victoria Walker, DOB 1950-04-24, MRN [id number]  PCP:  Domenica Harlene LABOR, MD  Cardiologist:  Jennifer JONELLE Crape, MD   Referring MD: Domenica Harlene LABOR, MD    ASSESSMENT:    1. Essential hypertension   2. Type 2 diabetes mellitus with diabetic neuropathy, without long-term current use of insulin  (HCC)   3. Hyperlipidemia, mixed    PLAN:    In order of problems listed above:  Primary prevention stressed with the patient.  Importance of compliance with diet medication stressed and patient verbalized standing. Essential hypertension: Her blood pressure stable.  We will switch her medicine to diltiazem  and stop metoprolol  per her request.  She will keep a track of her blood pressures at home and let us  know in 2 weeks how her blood pressure is. Mixed dyslipidemia: On lipid-lowering medications followed by primary care. Diabetes mellitus: Diet emphasized.  This is followed by primary care. Patient will be seen in follow-up appointment in 9 months or earlier if the patient has any concerns.     Medication Adjustments/Labs and Tests Ordered: Current medicines are reviewed at length with the patient today.  Concerns regarding medicines are outlined above.  No orders of the defined types were placed in this encounter.  No orders of the defined types were placed in this encounter.    No chief complaint on file.    History of Present Illness:    Victoria Walker is a 73 y.o. female.  Patient has past medical history of essential hypertension mixed dyslipidemia and diabetes mellitus.  She denies any problems at this time and takes care of activities of daily living.  No chest pain orthopnea or PND.  She has a specific request to change metoprolol  to diltiazem  and she tells me that she tolerates that medication better.  She has brought log of her blood pressures and they are fine.  Low blood pressures are 120/70.  Occasional blood  pressures are 140 or 150/70 in the morning around the time she takes her medications.  At the time of my evaluation, the patient is alert awake oriented and in no distress.  Past Medical History:  Diagnosis Date   Arthritis    both knees (07/02/2015)   Cancer of right breast (HCC)    Claustrophobia    Dysrhythmia    HX OF FAST HEART RATE AND PALPITATIONS - METOPROLOL  HAS HELPED   GERD (gastroesophageal reflux disease)    H/O hiatal hernia    H/O iritis    LEFT EYE - STATES HER EYE BECOMES RED AND VERY SENSITIVE TO LIGHT WHEN FLARE UP OF IRITIS   Heart murmur    benign; 06/2015   High cholesterol    History of blood transfusion    when I had my partial hysterectomy   History of chickenpox 09/26/2014   History of measles    History of MRSA infection    History of shingles    Hypertension    Hypothyroidism    Medicare annual wellness visit, initial 04/07/2015   Multinodular thyroid     PT HAVING TROUBLE SWALLOWING   Neuropathy    FEET   Primary localized osteoarthritis of right knee    Sciatica of right side 04/25/2015   Spinal headache    spinal headache with epidural   Total knee replacement status, right    Type II diabetes mellitus (HCC)    ORAL MEDICATION - NO INSULIN   Wears glasses     Past Surgical History:  Procedure Laterality Date   ABDOMINAL HYSTERECTOMY  1988   ABDOMINOPLASTY  1986   ANTERIOR AND POSTERIOR REPAIR  05/11/2012   Procedure: ANTERIOR (CYSTOCELE) AND POSTERIOR REPAIR (RECTOCELE);  Surgeon: Glendia DELENA Elizabeth, MD;  Location: [facility];  Service: Urology;;  with graft   BREAST BIOPSY Right    CARDIAC CATHETERIZATION  1999   COLONOSCOPY     CYSTOSCOPY WITH URETHRAL DILATATION  05/11/2012   Procedure: CYSTOSCOPY WITH URETHRAL DILATATION;  Surgeon: Glendia DELENA Elizabeth, MD;  Location: [facility];  Service: Urology;;   ESOPHAGEAL MANOMETRY N/A 10/09/2021   Procedure: ESOPHAGEAL MANOMETRY (EM);  Surgeon: Wilhelmenia Aloha Raddle., MD;  Location: [facility];   Service: Gastroenterology;  Laterality: N/A;   ESOPHAGOGASTRODUODENOSCOPY     HERNIA REPAIR     MASTECTOMY Right 2007   PLANTAR FASCIA SURGERY Left    THYROIDECTOMY N/A 02/23/2014   Procedure: TOTAL THYROIDECTOMY;  Surgeon: Krystal Spinner, MD;  Location: [facility];  Service: General;  Laterality: N/A;   TOTAL KNEE ARTHROPLASTY Right 07/02/2015   TOTAL KNEE ARTHROPLASTY Right 07/02/2015   Procedure: TOTAL KNEE ARTHROPLASTY;  Surgeon: Lamar Millman, MD;  Location: [facility];  Service: Orthopedics;  Laterality: Right;   TOTAL THYROIDECTOMY  1976   removed three tumor (2 on right; 1 on left)   TUBAL LIGATION     UMBILICAL HERNIA REPAIR  1986   VAGINAL PROLAPSE REPAIR  05/11/2012   Procedure: VAGINAL VAULT SUSPENSION;  Surgeon: Glendia DELENA Elizabeth, MD;  Location: [facility];  Service: Urology;;   VULVA / PERINEUM BIOPSY     vulvar    Current Medications: Current Meds  Medication Sig   acetaminophen  (TYLENOL ) 325 MG tablet Take 2 tablets (650 mg total) by mouth every 6 (six) hours as needed for moderate pain (pain score 4-6).   alendronate  (FOSAMAX ) 70 MG tablet Take 1 tablet (70 mg total) by mouth once a week. Take with a full glass of water  on an empty stomach.   aspirin  81 MG EC tablet Take 81 mg by mouth every other day. Swallow whole.   atorvastatin  (LIPITOR) 40 MG tablet Take 1 tablet (40 mg total) by mouth every other day alternating with 1/2 tablet.   Calcium  Carbonate-Vitamin D  (OS-CAL 500 + D PO)    Calcium  Polycarbophil (FIBER) 625 MG TABS Take 1 tablet by mouth daily.   Carboxymethylcellul-Glycerin (REFRESH OPTIVE OP) Apply to eye.   Cholecalciferol (VITAMIN D ) 2000 UNITS CAPS Take 1 capsule by mouth every evening.    Ciclopirox  1 % shampoo Apply 1(one) application(s) topically 2(two) times a week   cycloSPORINE  (RESTASIS ) 0.05 % ophthalmic emulsion Place 1 drop into both eyes 2 (two) times daily.   Estradiol  10 MCG TABS vaginal tablet Place 1 tablet (10 mcg total) vaginally every other day.    gabapentin  (NEURONTIN ) 300 MG capsule Take 1 capsule (300 mg total) by mouth at bedtime.   glucose blood (ONE TOUCH TEST STRIPS) test strip Use as directed twice daily to check blood sugar. DX code E11.9   irbesartan  (AVAPRO ) 75 MG tablet Take 1 tablet (75 mg total) by mouth daily.   levocetirizine (XYZAL ) 5 MG tablet Take 1 tablet (5 mg total) by mouth every evening.   levothyroxine  (SYNTHROID ) 88 MCG tablet Take 1 tablet (88 mcg total) by mouth daily. **SKIP SUNDAY**   loteprednol  (LOTEMAX ) 0.5 % ophthalmic suspension Place 1 drop into both eyes 4 (four) times daily with a flare up.  metFORMIN  (GLUCOPHAGE -XR) 750 MG 24 hr tablet Take 1 tablet (750 mg total) by mouth in the morning and at bedtime.   metoprolol  succinate (TOPROL -XL) 25 MG 24 hr tablet Take 1 tablet (25 mg total) by mouth 2 (two) times daily.   mometasone  (NASONEX ) 50 MCG/ACT nasal spray Place 1-2 sprays in each nostril daily as needed for stuffy nose.   NEXIUM  40 MG capsule Take 1 capsule (40 mg total) by mouth daily.   Probiotic Product (PROBIOTIC DAILY PO) Take 1 capsule by mouth daily.   spironolactone  (ALDACTONE ) 25 MG tablet Take 1.5 tablets (37.5 mg total) by mouth daily.     Allergies:   Paxlovid  [nirmatrelvir -ritonavir ], Codeine, Penicillins, Sulfa antibiotics, and Vicodin [hydrocodone -acetaminophen ]   Social History   Socioeconomic History   Marital status: Married    Spouse name: Not on file   Number of children: 3   Years of education: Not on file   Highest education level: Not on file  Occupational History    Comment: Retired  Tobacco Use   Smoking status: Never   Smokeless tobacco: Never  Vaping Use   Vaping status: Never Used  Substance and Sexual Activity   Alcohol  use: Not Currently   Drug use: Not Currently   Sexual activity: Not Currently    Birth control/protection: Surgical    Comment: lives with husband, retired from various jobs, diabetic diet  Other Topics Concern   Not on file  Social  History Narrative   Not on file   Social Drivers of Health   Financial Resource Strain: Low Risk  (11/13/2022)   Overall Financial Resource Strain (CARDIA)    Difficulty of Paying Living Expenses: Not hard at all  Food Insecurity: No Food Insecurity (02/25/2023)   Hunger Vital Sign    Worried About Running Out of Food in the Last Year: Never true    Ran Out of Food in the Last Year: Never true  Transportation Needs: No Transportation Needs (02/25/2023)   PRAPARE - Administrator, Civil Service (Medical): No    Lack of Transportation (Non-Medical): No  Physical Activity: Inactive (12/03/2022)   Exercise Vital Sign    Days of Exercise per Week: 0 days    Minutes of Exercise per Session: 0 min  Stress: No Stress Concern Present (11/13/2022)   [employer] of Occupational Health - Occupational Stress Questionnaire    Feeling of Stress : Only a little  Social Connections: Socially Integrated (12/03/2022)   Social Connection and Isolation Panel    Frequency of Communication with Friends and Family: More than three times a week    Frequency of Social Gatherings with Friends and Family: Once a week    Attends Religious Services: More than 4 times per year    Active Member of [employer] or Organizations: Yes    Attends Engineer, structural: More than 4 times per year    Marital Status: Married     Family History: The patient's family history includes Alcohol  abuse in her father; Arthritis in her sister and son; Benign prostatic hyperplasia in her brother; Breast cancer in her sister and sister; Cancer in her paternal aunt; Diabetes in her brother, daughter, father, sister, and sister; Endometriosis in her sister; Heart disease in her maternal grandmother and mother; Hyperlipidemia in her brother, brother, brother, daughter, mother, and sister; Hypertension in her brother, brother, brother, daughter, daughter, father, maternal aunt, maternal grandfather, maternal grandmother,  maternal uncle, mother, paternal aunt, paternal uncle, sister, sister, and sister;  Kidney Stones in her mother; Kidney disease in her brother, mother, and sister; Lung cancer in her father; Stroke in her brother and maternal grandmother; Thyroid  cancer in her sister; Thyroid  disease in her sister and sister. There is no history of Colon cancer, Stomach cancer, Esophageal cancer, Pancreatic cancer, Liver disease, Rectal cancer, or Inflammatory bowel disease.  ROS:   Please see the history of present illness.    All other systems reviewed and are negative.  EKGs/Labs/Other Studies Reviewed:    The following studies were reviewed today: I discussed my findings with the patient at length   Recent Labs: 06/15/2023: ALT 15; BUN 19; Creat 0.78; Hemoglobin 13.0; Platelets 296.0; Potassium 4.6; Sodium 139; TSH 0.49  Recent Lipid Panel    Component Value Date/Time   CHOL 158 06/15/2023 1216   TRIG 117 06/15/2023 1216   HDL 51 06/15/2023 1216   CHOLHDL 3.1 06/15/2023 1216   VLDL 37.6 12/31/2022 1349   LDLCALC 86 06/15/2023 1216   LDLDIRECT 107.0 07/01/2016 1628    Physical Exam:    VS:  BP (!) 140/70   Pulse 88   Ht 5' 5 (1.651 m)   Wt 160 lb (72.6 kg)   SpO2 96%   BMI 26.63 kg/m     Wt Readings from Last 3 Encounters:  10/21/23 160 lb (72.6 kg)  10/14/23 162 lb (73.5 kg)  09/30/23 156 lb (70.8 kg)     GEN: Patient is in no acute distress HEENT: Normal NECK: No JVD; No carotid bruits LYMPHATICS: No lymphadenopathy CARDIAC: Hear sounds regular, 2/6 systolic murmur at the apex. RESPIRATORY:  Clear to auscultation without rales, wheezing or rhonchi  ABDOMEN: Soft, non-tender, non-distended MUSCULOSKELETAL:  No edema; No deformity  SKIN: Warm and dry NEUROLOGIC:  Alert and oriented x 3 PSYCHIATRIC:  Normal affect   Signed, Jennifer JONELLE Crape, MD  10/21/2023 10:35 AM    [facility]

## 2023-10-21 NOTE — Telephone Encounter (Signed)
 Pt would like a call back concerning medication irbesartan , pt stating that she take 150 mg tablet and that Dr. Edwyna told her to double her dose. Please address

## 2023-10-22 ENCOUNTER — Other Ambulatory Visit (HOSPITAL_BASED_OUTPATIENT_CLINIC_OR_DEPARTMENT_OTHER): Payer: Self-pay

## 2023-10-22 ENCOUNTER — Telehealth: Payer: Self-pay | Admitting: Cardiology

## 2023-10-22 ENCOUNTER — Ambulatory Visit: Payer: Self-pay | Admitting: Cardiology

## 2023-10-22 DIAGNOSIS — R002 Palpitations: Secondary | ICD-10-CM | POA: Diagnosis not present

## 2023-10-22 MED ORDER — IRBESARTAN 150 MG PO TABS
150.0000 mg | ORAL_TABLET | Freq: Every day | ORAL | 3 refills | Status: DC
  Filled 2023-10-22: qty 90, 90d supply, fill #0

## 2023-10-22 NOTE — Telephone Encounter (Signed)
 Spoke with patient and advised her that cardiologist sent in the 150mg  and to make sure that she send in bp readings in two weeks to them.

## 2023-10-22 NOTE — Telephone Encounter (Signed)
 Refill sent.

## 2023-10-22 NOTE — Telephone Encounter (Signed)
 Patient is requesting to switch doctors from Dr. Edwyna to Dr. Sheena.  Please advise, Thank you

## 2023-10-25 ENCOUNTER — Encounter: Payer: Self-pay | Admitting: Family Medicine

## 2023-10-25 ENCOUNTER — Other Ambulatory Visit: Payer: Self-pay | Admitting: Obstetrics & Gynecology

## 2023-10-25 DIAGNOSIS — I10 Essential (primary) hypertension: Secondary | ICD-10-CM

## 2023-10-25 MED ORDER — FLUCONAZOLE 150 MG PO TABS
150.0000 mg | ORAL_TABLET | ORAL | 3 refills | Status: AC
  Filled 2023-10-25: qty 3, 9d supply, fill #0
  Filled 2024-01-17: qty 3, 9d supply, fill #1
  Filled 2024-02-12: qty 3, 9d supply, fill #2
  Filled 2024-04-19: qty 3, 9d supply, fill #3

## 2023-10-26 ENCOUNTER — Other Ambulatory Visit (HOSPITAL_BASED_OUTPATIENT_CLINIC_OR_DEPARTMENT_OTHER): Payer: Self-pay

## 2023-10-27 ENCOUNTER — Other Ambulatory Visit (HOSPITAL_BASED_OUTPATIENT_CLINIC_OR_DEPARTMENT_OTHER): Payer: Self-pay

## 2023-10-27 MED ORDER — AMLODIPINE BESYLATE 2.5 MG PO TABS
2.5000 mg | ORAL_TABLET | Freq: Every day | ORAL | 0 refills | Status: DC
  Filled 2023-10-27: qty 30, 30d supply, fill #0

## 2023-10-28 ENCOUNTER — Other Ambulatory Visit: Payer: Self-pay | Admitting: Family Medicine

## 2023-10-29 ENCOUNTER — Other Ambulatory Visit (HOSPITAL_BASED_OUTPATIENT_CLINIC_OR_DEPARTMENT_OTHER): Payer: Self-pay

## 2023-10-29 ENCOUNTER — Other Ambulatory Visit: Payer: Self-pay

## 2023-10-29 DIAGNOSIS — I10 Essential (primary) hypertension: Secondary | ICD-10-CM

## 2023-10-29 MED ORDER — DILTIAZEM HCL ER 180 MG PO TB24
180.0000 mg | ORAL_TABLET | Freq: Every day | ORAL | 1 refills | Status: DC
  Filled 2023-10-29: qty 30, 30d supply, fill #0

## 2023-10-30 ENCOUNTER — Other Ambulatory Visit (HOSPITAL_BASED_OUTPATIENT_CLINIC_OR_DEPARTMENT_OTHER): Payer: Self-pay

## 2023-11-03 ENCOUNTER — Ambulatory Visit (HOSPITAL_BASED_OUTPATIENT_CLINIC_OR_DEPARTMENT_OTHER)
Admission: RE | Admit: 2023-11-03 | Discharge: 2023-11-03 | Disposition: A | Discharge status: Home / Self Care | Source: Ambulatory Visit | Attending: Cardiology | Admitting: Cardiology

## 2023-11-03 DIAGNOSIS — R011 Cardiac murmur, unspecified: Secondary | ICD-10-CM | POA: Insufficient documentation

## 2023-11-05 LAB — ECHOCARDIOGRAM COMPLETE
AR max vel: 3.09 cm2
AV Area VTI: 2.81 cm2
AV Area mean vel: 2.76 cm2
AV Mean grad: 5 mmHg
AV Peak grad: 8.3 mmHg
Ao pk vel: 1.44 m/s
Area-P 1/2: 2.79 cm2
Calc EF: 65.8 %
S' Lateral: 2.1 cm
Single Plane A2C EF: 66.7 %
Single Plane A4C EF: 61.2 %

## 2023-11-12 ENCOUNTER — Encounter: Payer: Self-pay | Admitting: Family Medicine

## 2023-11-16 ENCOUNTER — Other Ambulatory Visit: Payer: Self-pay

## 2023-11-16 ENCOUNTER — Other Ambulatory Visit (HOSPITAL_BASED_OUTPATIENT_CLINIC_OR_DEPARTMENT_OTHER): Payer: Self-pay

## 2023-11-25 ENCOUNTER — Ambulatory Visit

## 2023-11-30 ENCOUNTER — Other Ambulatory Visit (HOSPITAL_BASED_OUTPATIENT_CLINIC_OR_DEPARTMENT_OTHER): Payer: Self-pay

## 2023-11-30 ENCOUNTER — Ambulatory Visit (INDEPENDENT_AMBULATORY_CARE_PROVIDER_SITE_OTHER)

## 2023-11-30 ENCOUNTER — Other Ambulatory Visit: Payer: Self-pay

## 2023-11-30 DIAGNOSIS — I10 Essential (primary) hypertension: Secondary | ICD-10-CM

## 2023-11-30 MED ORDER — DILTIAZEM HCL 120 MG PO TABS
120.0000 mg | ORAL_TABLET | Freq: Every day | ORAL | 1 refills | Status: DC
  Filled 2023-11-30: qty 90, 90d supply, fill #0

## 2023-11-30 MED ORDER — IRBESARTAN 150 MG PO TABS
75.0000 mg | ORAL_TABLET | Freq: Every day | ORAL | Status: AC
Start: 2023-11-30 — End: ?

## 2023-11-30 NOTE — Progress Notes (Signed)
 Pt here for Blood pressure check per she just scheduled appointment.  Pt currently takes: Diltiazem  120 MG, Irbesartan  150 MG (pt reports taking 75 MG)   Pt reports compliance with medication.  BP today @ =122/74 HR =72  Pt advised per Dr. Watt continue with current medication doses.

## 2023-11-30 NOTE — Telephone Encounter (Signed)
 Patient came in for a nursing visit bp check and wanted to know if she could get a refill for Diltiazem  120 MG because she have not started the 180 MG. Also she wanted to let Dr. Domenica know she is taking half (75 MG) of her Irbesartan  150 MG due to noticing lower bp readings at home.

## 2023-11-30 NOTE — Telephone Encounter (Signed)
 Medication send into pharmacy and Irbesartan changed to 1/2 tablet daily.

## 2023-12-01 ENCOUNTER — Other Ambulatory Visit (HOSPITAL_BASED_OUTPATIENT_CLINIC_OR_DEPARTMENT_OTHER): Payer: Self-pay

## 2023-12-08 ENCOUNTER — Other Ambulatory Visit (HOSPITAL_BASED_OUTPATIENT_CLINIC_OR_DEPARTMENT_OTHER): Payer: Self-pay

## 2023-12-08 ENCOUNTER — Ambulatory Visit (INDEPENDENT_AMBULATORY_CARE_PROVIDER_SITE_OTHER)

## 2023-12-08 ENCOUNTER — Other Ambulatory Visit: Payer: Self-pay

## 2023-12-08 VITALS — Ht 65.0 in | Wt 160.0 lb

## 2023-12-08 DIAGNOSIS — Z780 Asymptomatic menopausal state: Secondary | ICD-10-CM | POA: Diagnosis not present

## 2023-12-08 DIAGNOSIS — Z0000 Encounter for general adult medical examination without abnormal findings: Secondary | ICD-10-CM | POA: Diagnosis not present

## 2023-12-08 NOTE — Progress Notes (Signed)
 Subjective:   Victoria Walker is a 73 y.o. who presents for a Medicare Wellness preventive visit.  As a reminder, Annual Wellness Visits don't include a physical exam, and some assessments may be limited, especially if this visit is performed virtually. We may recommend an in-person follow-up visit with your provider if needed.  Visit Complete: Virtual I connected with  Victoria Walker on 12/08/23 by a audio enabled telemedicine application and verified that I am speaking with the correct person using two identifiers.  Patient Location: Home  Provider Location: Home Office  I discussed the limitations of evaluation and management by telemedicine. The patient expressed understanding and agreed to proceed.  Vital Signs: Because this visit was a virtual/telehealth visit, some criteria may be missing or patient reported. Any vitals not documented were not able to be obtained and vitals that have been documented are patient reported.  VideoDeclined- This patient declined Librarian, academic. Therefore the visit was completed with audio only.  Persons Participating in Visit: Patient.  AWV Questionnaire: No: Patient Medicare AWV questionnaire was not completed prior to this visit.  Cardiac Risk Factors include: advanced age (>78men, >14 women);diabetes mellitus;dyslipidemia;hypertension     Objective:    Today's Vitals   12/08/23 1102  Weight: 160 lb (72.6 kg)  Height: 5' 5 (1.651 m)   Body mass index is 26.63 kg/m.     12/08/2023   11:17 AM 12/03/2022   11:01 AM 11/11/2021    9:05 AM 07/30/2021    1:30 PM 11/07/2020    3:44 PM 03/21/2019    8:57 AM 05/17/2018   10:32 AM  Advanced Directives  Does Patient Have a Medical Advance Directive? Yes Yes Yes Yes Yes Yes Yes   Type of Estate agent of [place];Living will Healthcare Power of [place];Living will Healthcare Power of [place];Living will Healthcare Power of  [place];Living will Healthcare Power of [place];Living will Healthcare Power of [place];Living will   Does patient want to make changes to medical advance directive? No - Patient declined No - Patient declined No - Patient declined   No - Patient declined No - Patient declined   Copy of Healthcare Power of Attorney in Chart? Yes - validated most recent copy scanned in chart (See row information) Yes - validated most recent copy scanned in chart (See row information) Yes - validated most recent copy scanned in chart (See row information) No - copy requested Yes - validated most recent copy scanned in chart (See row information) Yes - validated most recent copy scanned in chart (See row information)      Data saved with a previous flowsheet row definition    Current Medications (verified) Outpatient Encounter Medications as of 12/08/2023  Medication Sig   acetaminophen  (TYLENOL ) 325 MG tablet Take 2 tablets (650 mg total) by mouth every 6 (six) hours as needed for moderate pain (pain score 4-6).   alendronate  (FOSAMAX ) 70 MG tablet Take 1 tablet (70 mg total) by mouth once a week. Take with a full glass of water  on an empty stomach.   aspirin  81 MG EC tablet Take 81 mg by mouth every other day. Swallow whole.   atorvastatin  (LIPITOR) 40 MG tablet Take 1 tablet (40 mg total) by mouth every other day alternating with 1/2 tablet.   Calcium  Carbonate-Vitamin D  (OS-CAL 500 + D PO)    Calcium  Polycarbophil (FIBER) 625 MG TABS Take 1 tablet by mouth daily.   Carboxymethylcellul-Glycerin (REFRESH OPTIVE OP) Apply to eye.  Cholecalciferol (VITAMIN D ) 2000 UNITS CAPS Take 1 capsule by mouth every evening.    Ciclopirox  1 % shampoo Apply 1(one) application(s) topically 2(two) times a week   cycloSPORINE  (RESTASIS ) 0.05 % ophthalmic emulsion Place 1 drop into both eyes 2 (two) times daily.   diltiazem  (CARDIZEM ) 120 MG tablet Take 1 tablet (120 mg total) by mouth daily.   Estradiol  10 MCG TABS vaginal  tablet Place 1 tablet (10 mcg total) vaginally every other day.   fluconazole  (DIFLUCAN ) 150 MG tablet Take 1 tablet (150 mg total) by mouth every 3 (three) days. For three doses   gabapentin  (NEURONTIN ) 300 MG capsule Take 1 capsule (300 mg total) by mouth at bedtime.   glucose blood (ONE TOUCH TEST STRIPS) test strip Use as directed twice daily to check blood sugar. DX code E11.9   irbesartan  (AVAPRO ) 150 MG tablet Take 0.5 tablets (75 mg total) by mouth daily.   levocetirizine (XYZAL ) 5 MG tablet Take 1 tablet (5 mg total) by mouth every evening.   levothyroxine  (SYNTHROID ) 88 MCG tablet Take 1 tablet (88 mcg total) by mouth daily. **SKIP SUNDAY**   loteprednol  (LOTEMAX ) 0.5 % ophthalmic suspension Place 1 drop into both eyes 4 (four) times daily with a flare up.   metFORMIN  (GLUCOPHAGE -XR) 750 MG 24 hr tablet Take 1 tablet (750 mg total) by mouth in the morning and at bedtime.   mometasone  (NASONEX ) 50 MCG/ACT nasal spray Place 1-2 sprays in each nostril daily as needed for stuffy nose.   NEXIUM  40 MG capsule Take 1 capsule (40 mg total) by mouth daily.   Probiotic Product (PROBIOTIC DAILY PO) Take 1 capsule by mouth daily.   spironolactone  (ALDACTONE ) 25 MG tablet Take 1.5 tablets (37.5 mg total) by mouth daily.   No facility-administered encounter medications on file as of 12/08/2023.    Allergies (verified) Paxlovid  [nirmatrelvir -ritonavir ], Codeine, Penicillins, Sulfa antibiotics, and Vicodin [hydrocodone -acetaminophen ]   History: Past Medical History:  Diagnosis Date   Arthritis    both knees (07/02/2015)   Cancer of right breast (HCC)    Claustrophobia    Dysrhythmia    HX OF FAST HEART RATE AND PALPITATIONS - METOPROLOL  HAS HELPED   GERD (gastroesophageal reflux disease)    H/O hiatal hernia    H/O iritis    LEFT EYE - STATES HER EYE BECOMES RED AND VERY SENSITIVE TO LIGHT WHEN FLARE UP OF IRITIS   Heart murmur    benign; 06/2015   High cholesterol    History of blood  transfusion    when I had my partial hysterectomy   History of chickenpox 09/26/2014   History of measles    History of MRSA infection    History of shingles    Hypertension    Hypothyroidism    Medicare annual wellness visit, initial 04/07/2015   Multinodular thyroid     PT HAVING TROUBLE SWALLOWING   Neuropathy    FEET   Primary localized osteoarthritis of right knee    Sciatica of right side 04/25/2015   Spinal headache    spinal headache with epidural   Total knee replacement status, right    Type II diabetes mellitus (HCC)    ORAL MEDICATION - NO INSULIN    Wears glasses    Past Surgical History:  Procedure Laterality Date   ABDOMINAL HYSTERECTOMY  1988   ABDOMINOPLASTY  1986   ANTERIOR AND POSTERIOR REPAIR  05/11/2012   Procedure: ANTERIOR (CYSTOCELE) AND POSTERIOR REPAIR (RECTOCELE);  Surgeon: Glendia DELENA Elizabeth, MD;  Location: [facility];  Service: Urology;;  with graft   BREAST BIOPSY Right    CARDIAC CATHETERIZATION  1999   COLONOSCOPY     CYSTOSCOPY WITH URETHRAL DILATATION  05/11/2012   Procedure: CYSTOSCOPY WITH URETHRAL DILATATION;  Surgeon: Glendia DELENA Elizabeth, MD;  Location: [facility];  Service: Urology;;   ESOPHAGEAL MANOMETRY N/A 10/09/2021   Procedure: ESOPHAGEAL MANOMETRY (EM);  Surgeon: Wilhelmenia Aloha Raddle., MD;  Location: [facility];  Service: Gastroenterology;  Laterality: N/A;   ESOPHAGOGASTRODUODENOSCOPY     HERNIA REPAIR     MASTECTOMY Right 2007   PLANTAR FASCIA SURGERY Left    THYROIDECTOMY N/A 02/23/2014   Procedure: TOTAL THYROIDECTOMY;  Surgeon: Krystal Spinner, MD;  Location: [facility];  Service: General;  Laterality: N/A;   TOTAL KNEE ARTHROPLASTY Right 07/02/2015   TOTAL KNEE ARTHROPLASTY Right 07/02/2015   Procedure: TOTAL KNEE ARTHROPLASTY;  Surgeon: Lamar Millman, MD;  Location[facility];  Service: Orthopedics;  Laterality: Right;   TOTAL THYROIDECTOMY  1976   removed three tumor (2 on right; 1 on left)   TUBAL LIGATION     UMBILICAL HERNIA REPAIR  1986    VAGINAL PROLAPSE REPAIR  05/11/2012   Procedure: VAGINAL VAULT SUSPENSION;  Surgeon: Glendia DELENA Elizabeth, MD;  Location: [facility];  Service: Urology;;   VULVA / PERINEUM BIOPSY     vulvar   Family History  Problem Relation Age of Onset   Hypertension Maternal Grandmother    Heart disease Maternal Grandmother    Stroke Maternal Grandmother    Hypertension Maternal Grandfather    Hypertension Father    Diabetes Father    Alcohol  abuse Father    Lung cancer Father        lung, psa   Hypertension Mother    Heart disease Mother        pacer, CHF   Hyperlipidemia Mother    Kidney disease Mother        1 kidney due to stone   Kidney Stones Mother    Diabetes Brother    Hypertension Brother    Hyperlipidemia Brother    Kidney disease Brother        dialysis   Stroke Brother    Hypertension Brother    Hyperlipidemia Brother    Hypertension Brother    Hyperlipidemia Brother    Benign prostatic hyperplasia Brother    Diabetes Sister    Hypertension Sister    Breast cancer Sister    Hyperlipidemia Sister    Hypertension Sister    Diabetes Sister    Thyroid  disease Sister    Kidney disease Sister    Endometriosis Sister    Breast cancer Sister        thyroid  and breast   Thyroid  cancer Sister    Arthritis Sister        back pain   Hypertension Sister    Thyroid  disease Sister    Hypertension Daughter    Hypertension Daughter    Hyperlipidemia Daughter    Diabetes Daughter    Arthritis Son    Hypertension Maternal Aunt    Hypertension Maternal Uncle    Hypertension Paternal Aunt    Cancer Paternal Aunt    Hypertension Paternal Uncle    Colon cancer Neg Hx    Stomach cancer Neg Hx    Esophageal cancer Neg Hx    Pancreatic cancer Neg Hx    Liver disease Neg Hx    Rectal cancer Neg Hx    Inflammatory bowel disease  Neg Hx    Social History   Socioeconomic History   Marital status: Married    Spouse name: Not on file   Number of children: 3   Years of education: Not  on file   Highest education level: Not on file  Occupational History    Comment: Retired  Tobacco Use   Smoking status: Never   Smokeless tobacco: Never  Vaping Use   Vaping status: Never Used  Substance and Sexual Activity   Alcohol  use: Not Currently   Drug use: Not Currently   Sexual activity: Not Currently    Birth control/protection: Surgical    Comment: lives with husband, retired from various jobs, diabetic diet  Other Topics Concern   Not on file  Social History Narrative   Not on file   Social Drivers of Health   Financial Resource Strain: Low Risk  (12/08/2023)   Overall Financial Resource Strain (CARDIA)    Difficulty of Paying Living Expenses: Not hard at all  Food Insecurity: No Food Insecurity (12/08/2023)   Hunger Vital Sign    Worried About Running Out of Food in the Last Year: Never true    Ran Out of Food in the Last Year: Never true  Transportation Needs: No Transportation Needs (12/08/2023)   PRAPARE - Administrator, Civil Service (Medical): No    Lack of Transportation (Non-Medical): No  Physical Activity: Inactive (12/08/2023)   Exercise Vital Sign    Days of Exercise per Week: 0 days    Minutes of Exercise per Session: 0 min  Stress: No Stress Concern Present (12/08/2023)   [employer] of Occupational Health - Occupational Stress Questionnaire    Feeling of Stress: Not at all  Social Connections: Socially Integrated (12/08/2023)   Social Connection and Isolation Panel    Frequency of Communication with Friends and Family: More than three times a week    Frequency of Social Gatherings with Friends and Family: Three times a week    Attends Religious Services: More than 4 times per year    Active Member of Clubs or Organizations: Yes    Attends Engineer, structural: More than 4 times per year    Marital Status: Married    Tobacco Counseling Counseling given: Not Answered    Clinical Intake:  Pre-visit preparation  completed: Yes  Pain : No/denies pain  Diabetes: Yes CBG done?: No Did pt. bring in CBG monitor from home?: No  Lab Results  Component Value Date   HGBA1C 6.8 (H) 06/15/2023   HGBA1C 6.1 12/31/2022   HGBA1C 6.5 09/08/2022     How often do you need to have someone help you when you read instructions, pamphlets, or other written materials from your doctor or pharmacy?: 1 - Never  Interpreter Needed?: No  Information entered by :: Charmaine Bloodgood LPN   Activities of Daily Living     12/08/2023   11:17 AM  In your present state of health, do you have any difficulty performing the following activities:  Hearing? 0  Vision? 0  Difficulty concentrating or making decisions? 0  Walking or climbing stairs? 0  Dressing or bathing? 0  Doing errands, shopping? 0  Preparing Food and eating ? N  Using the Toilet? N  In the past six months, have you accidently leaked urine? N  Do you have problems with loss of bowel control? N  Managing your Medications? N  Managing your Finances? N  Housekeeping or managing your Housekeeping? N  Patient Care Team: Domenica Harlene LABOR, MD as PCP - General (Family Medicine) Kristie Lamprey, MD as Consulting Physician (Gastroenterology) Seldomridge, Breck Helling, MD (Ophthalmology) Sheard, Jaylene KIDD, DPM (Inactive) as Consulting Physician (Podiatry) Cristino Rush, OD as Referring Physician (Optometry) Asa Aloysius LABOR, MD (Inactive) as Consulting Physician (Allergy and Immunology) Corene Coy, MD as Consulting Physician (Obstetrics and Gynecology) Jane Charleston, MD as Consulting Physician (Orthopedic Surgery) Mammography, [facility] (Diagnostic Radiology)  I have updated your Care Teams any recent Medical Services you may have received from other providers in the past year.     Assessment:   This is a routine wellness examination for [facility].  Hearing/Vision screen Hearing Screening - Comments:: Denies hearing difficulties   Vision Screening  - Comments:: Wears rx glasses - up to date with routine eye exams with [facility].    Goals Addressed             This Visit's Progress    Maintain health and independence   On track      Depression Screen     12/08/2023   11:16 AM 10/14/2023    2:08 PM 12/31/2022    1:27 PM 12/03/2022   11:15 AM 08/06/2022    1:59 PM 05/01/2022   10:41 AM 01/09/2022    9:02 AM  PHQ 2/9 Scores  PHQ - 2 Score 0 0 0 0 0 0 0  PHQ- 9 Score  2   1  0    Fall Risk     12/08/2023   11:17 AM 02/25/2023   11:23 AM 12/31/2022    1:27 PM 12/03/2022   11:06 AM 05/01/2022   10:40 AM  Fall Risk   Falls in the past year? 0 0 0 0 0  Number falls in past yr: 0  0 0 0  Injury with Fall? 0  0 0 0  Risk for fall due to : No Fall Risks;Impaired mobility  No Fall Risks No Fall Risks   Follow up Falls prevention discussed;Education provided;Falls evaluation completed  Falls evaluation completed Falls evaluation completed Falls evaluation completed      Data saved with a previous flowsheet row definition    MEDICARE RISK AT HOME:  Medicare Risk at Home Any stairs in or around the home?: No If so, are there any without handrails?: No Home free of loose throw rugs in walkways, pet beds, electrical cords, etc?: Yes Adequate lighting in your home to reduce risk of falls?: Yes Life alert?: No Use of a cane, walker or w/c?: No Grab bars in the bathroom?: Yes Shower chair or bench in shower?: No Elevated toilet seat or a handicapped toilet?: Yes  TIMED UP AND GO:  Was the test performed?  No  Cognitive Function: Declined/Normal: No cognitive concerns noted by patient or family. Patient alert, oriented, able to answer questions appropriately and recall recent events. No signs of memory loss or confusion.    03/10/2017    9:34 AM 02/25/2016   10:56 AM 02/22/2015    9:56 AM  MMSE - Mini Mental State Exam  Orientation to time 5  5  5    Orientation to Place 5  5  5    Registration 3  3  3    Attention/ Calculation 5   5  5    Recall 3  3  1    Language- name 2 objects 2  2  2    Language- repeat 1 1 1   Language- follow 3 step command 3  3  3  Language- read & follow direction 1  1  1    Write a sentence 1  1  1    Copy design 1  0  1   Total score 30  29  28       Data saved with a previous flowsheet row definition        12/03/2022   11:17 AM 11/11/2021    9:17 AM  6CIT Screen  What Year? 0 points 0 points  What month? 0 points 0 points  What time? 0 points 0 points  Count back from 20 0 points 0 points  Months in reverse 0 points 0 points  Repeat phrase 0 points 0 points  Total Score 0 points 0 points    Immunizations Immunization History  Administered Date(s) Administered   Fluad Quad(high Dose 65+) 12/17/2018, 01/21/2021, 01/09/2022   Fluad Trivalent(High Dose 65+) 12/31/2022   Influenza Whole 01/28/2017   Influenza, High Dose Seasonal PF 01/21/2016, 02/04/2017, 01/28/2018   Influenza,inj,Quad PF,6+ Mos 01/04/2013, 01/23/2014, 12/28/2014   PFIZER Comirnaty (Gray Top)Covid-19 Tri-Sucrose Vaccine 10/26/2020   PFIZER(Purple Top)SARS-COV-2 Vaccination 06/12/2019, 07/06/2019, 02/06/2020   Pfizer Covid-19 Vaccine Bivalent Booster 73yrs & up 02/05/2021   Pfizer(Comirnaty )Fall Seasonal Vaccine 12 years and older 02/24/2022   Pneumococcal Conjugate-13 12/28/2014   Pneumococcal Polysaccharide-23 05/12/2012, 10/27/2017   Respiratory Syncytial Virus Vaccine ,Recomb Aduvanted(Arexvy ) 05/06/2022   Tdap 05/02/2014   Zoster Recombinant(Shingrix) 01/15/2018, 03/25/2018   Zoster, Live 04/05/2013    Screening Tests Health Maintenance  Topic Date Due   FOOT EXAM  12/11/2022   INFLUENZA VACCINE  11/20/2023   COVID-19 Vaccine (7 - 2024-25 season) 12/30/2023 (Originally 12/21/2022)   HEMOGLOBIN A1C  12/13/2023   MAMMOGRAM  01/14/2024   OPHTHALMOLOGY EXAM  01/16/2024   DTaP/Tdap/Td (2 - Td or Tdap) 05/02/2024   Diabetic kidney evaluation - eGFR measurement  06/14/2024   Diabetic kidney evaluation -  Urine ACR  06/14/2024   Medicare Annual Wellness (AWV)  12/07/2024   Colonoscopy  03/03/2026   Pneumococcal Vaccine: 50+ Years  Completed   DEXA SCAN  Completed   Hepatitis C Screening  Completed   Zoster Vaccines- Shingrix  Completed   HPV VACCINES  Aged Out   Meningococcal B Vaccine  Aged Out   Pneumococcal Vaccine  Discontinued    Health Maintenance  Health Maintenance Due  Topic Date Due   FOOT EXAM  12/11/2022   INFLUENZA VACCINE  11/20/2023   Health Maintenance Items Addressed: DEXA ordered  Additional Screening:  Vision Screening: Recommended annual ophthalmology exams for early detection of glaucoma and other disorders of the eye. Would you like a referral to an eye doctor? No    Dental Screening: Recommended annual dental exams for proper oral hygiene  Community Resource Referral / Chronic Care Management: CRR required this visit?  No   CCM required this visit?  No   Plan:    I have personally reviewed and noted the following in the patient's chart:   Medical and social history Use of alcohol , tobacco or illicit drugs  Current medications and supplements including opioid prescriptions. Patient is not currently taking opioid prescriptions. Functional ability and status Nutritional status Physical activity Advanced directives List of other physicians Hospitalizations, surgeries, and ER visits in previous 12 months Vitals Screenings to include cognitive, depression, and falls Referrals and appointments  In addition, I have reviewed and discussed with patient certain preventive protocols, quality metrics, and best practice recommendations. A written personalized care plan for preventive services as well as general preventive health recommendations  were provided to patient.   Lavelle Pfeiffer [place], [place]   1/80/7974   After Visit Summary: (MyChart) Due to this being a telephonic visit, the after visit summary with patients personalized plan was offered  to patient via MyChart   Notes: Nothing significant to report at this time.

## 2023-12-08 NOTE — Patient Instructions (Signed)
 Ms. Bilek , Thank you for taking time out of your busy schedule to complete your Annual Wellness Visit with me. I enjoyed our conversation and look forward to speaking with you again next year. I, as well as your care team,  appreciate your ongoing commitment to your health goals. Please review the following plan we discussed and let me know if I can assist you in the future. Your Game plan/ To Do List    Referrals: You have an order for:  []   2D Mammogram  []   3D Mammogram  [x]   Bone Density     Please call for appointment:   [facility] [address] [place], [place] [address] [phone]   Make sure to wear two-piece clothing.  No lotions, powders, or deodorants the day of the appointment. Make sure to bring picture ID and insurance card.  Bring list of medications you are currently taking including any supplements.   Follow up Visits: We will see or speak with you next year for your Next Medicare AWV with our clinical staff Have you seen your provider in the last 6 months (3 months if uncontrolled diabetes)? Yes  Clinician Recommendations:  Aim for 30 minutes of exercise or brisk walking, 6-8 glasses of water , and 5 servings of fruits and vegetables each day.       This is a list of the screenings recommended for you:  Health Maintenance  Topic Date Due   Complete foot exam   12/11/2022   Flu Shot  11/20/2023   COVID-19 Vaccine (7 - 2024-25 season) 12/30/2023*   Hemoglobin A1C  12/13/2023   Mammogram  01/14/2024   Eye exam for diabetics  01/16/2024   DTaP/Tdap/Td vaccine (2 - Td or Tdap) 05/02/2024   Yearly kidney function blood test for diabetes  06/14/2024   Yearly kidney health urinalysis for diabetes  06/14/2024   Medicare Annual Wellness Visit  12/07/2024   Colon Cancer Screening  03/03/2026   Pneumococcal Vaccine for age over 57  Completed   DEXA scan (bone density measurement)  Completed   Hepatitis C Screening   Completed   Zoster (Shingles) Vaccine  Completed   HPV Vaccine  Aged Out   Meningitis B Vaccine  Aged Out   Pneumococcal Vaccine  Discontinued  *Topic was postponed. The date shown is not the original due date.    Advanced directives: (In Chart) A copy of your advanced directives are scanned into your chart should your provider ever need it.  Advance Care Planning is important because it:  [x]  Makes sure you receive the medical care that is consistent with your values, goals, and preferences  [x]  It provides guidance to your family and loved ones and reduces their decisional burden about whether or not they are making the right decisions based on your wishes.  Follow the link provided in your after visit summary or read over the paperwork we have mailed to you to help you started getting your Advance Directives in place. If you need assistance in completing these, please reach out to us  so that we can help you!  See attachments for Preventive Care and Fall Prevention Tips.

## 2023-12-10 ENCOUNTER — Telehealth: Payer: Self-pay

## 2023-12-10 NOTE — Telephone Encounter (Signed)
 Copied from CRM [id number]. Topic: Clinical - Medication Question >> Dec 09, 2023  8:41 AM Viola F wrote: Reason for CRM: Patient says the Diltiazem  (CARDIZEM ) 120 MG tablet that was sent to the pharmacy is not extended relief - she needs the capsules instead of the tablets with extended relief resent to med center pharmacy

## 2023-12-11 ENCOUNTER — Other Ambulatory Visit: Payer: Self-pay | Admitting: Family Medicine

## 2023-12-11 ENCOUNTER — Other Ambulatory Visit (HOSPITAL_BASED_OUTPATIENT_CLINIC_OR_DEPARTMENT_OTHER): Payer: Self-pay

## 2023-12-11 MED ORDER — DILTIAZEM HCL ER COATED BEADS 120 MG PO CP24
120.0000 mg | ORAL_CAPSULE | Freq: Every day | ORAL | 1 refills | Status: AC
  Filled 2023-12-11 – 2024-01-10 (×3): qty 90, 90d supply, fill #0
  Filled 2024-02-12 – 2024-03-18 (×3): qty 90, 90d supply, fill #1

## 2023-12-17 ENCOUNTER — Other Ambulatory Visit: Payer: Self-pay

## 2023-12-17 ENCOUNTER — Other Ambulatory Visit (HOSPITAL_BASED_OUTPATIENT_CLINIC_OR_DEPARTMENT_OTHER): Payer: Self-pay

## 2023-12-17 ENCOUNTER — Other Ambulatory Visit: Payer: Self-pay | Admitting: Family Medicine

## 2023-12-17 DIAGNOSIS — N952 Postmenopausal atrophic vaginitis: Secondary | ICD-10-CM

## 2023-12-17 DIAGNOSIS — Z780 Asymptomatic menopausal state: Secondary | ICD-10-CM

## 2023-12-18 ENCOUNTER — Other Ambulatory Visit (HOSPITAL_BASED_OUTPATIENT_CLINIC_OR_DEPARTMENT_OTHER): Payer: Self-pay

## 2023-12-18 MED ORDER — ESTRADIOL 10 MCG VA TABS
10.0000 ug | ORAL_TABLET | VAGINAL | 4 refills | Status: AC
  Filled 2023-12-18: qty 16, 30d supply, fill #0
  Filled 2024-03-14: qty 16, 30d supply, fill #1

## 2023-12-19 DIAGNOSIS — J019 Acute sinusitis, unspecified: Secondary | ICD-10-CM | POA: Diagnosis not present

## 2023-12-19 DIAGNOSIS — B9689 Other specified bacterial agents as the cause of diseases classified elsewhere: Secondary | ICD-10-CM | POA: Diagnosis not present

## 2023-12-22 ENCOUNTER — Other Ambulatory Visit (HOSPITAL_BASED_OUTPATIENT_CLINIC_OR_DEPARTMENT_OTHER): Payer: Self-pay

## 2024-01-10 ENCOUNTER — Other Ambulatory Visit (HOSPITAL_BASED_OUTPATIENT_CLINIC_OR_DEPARTMENT_OTHER): Payer: Self-pay

## 2024-01-11 ENCOUNTER — Other Ambulatory Visit (HOSPITAL_BASED_OUTPATIENT_CLINIC_OR_DEPARTMENT_OTHER): Payer: Self-pay

## 2024-01-12 ENCOUNTER — Other Ambulatory Visit (HOSPITAL_BASED_OUTPATIENT_CLINIC_OR_DEPARTMENT_OTHER): Payer: Self-pay

## 2024-01-12 ENCOUNTER — Other Ambulatory Visit: Payer: Self-pay | Admitting: Cardiology

## 2024-01-14 ENCOUNTER — Ambulatory Visit (HOSPITAL_BASED_OUTPATIENT_CLINIC_OR_DEPARTMENT_OTHER)
Admission: RE | Admit: 2024-01-14 | Discharge: 2024-01-14 | Disposition: A | Discharge status: Home / Self Care | Source: Ambulatory Visit | Attending: Family Medicine | Admitting: Family Medicine

## 2024-01-14 DIAGNOSIS — M8589 Other specified disorders of bone density and structure, multiple sites: Secondary | ICD-10-CM | POA: Diagnosis not present

## 2024-01-14 DIAGNOSIS — Z780 Asymptomatic menopausal state: Secondary | ICD-10-CM | POA: Diagnosis not present

## 2024-01-15 ENCOUNTER — Ambulatory Visit: Payer: Self-pay | Admitting: Family Medicine

## 2024-01-15 DIAGNOSIS — Z1231 Encounter for screening mammogram for malignant neoplasm of breast: Secondary | ICD-10-CM | POA: Diagnosis not present

## 2024-01-15 LAB — HM MAMMOGRAPHY

## 2024-01-18 ENCOUNTER — Encounter: Payer: Self-pay | Admitting: Family Medicine

## 2024-01-20 ENCOUNTER — Other Ambulatory Visit (HOSPITAL_BASED_OUTPATIENT_CLINIC_OR_DEPARTMENT_OTHER): Payer: Self-pay

## 2024-01-20 MED ORDER — IRBESARTAN 150 MG PO TABS
150.0000 mg | ORAL_TABLET | Freq: Every day | ORAL | 3 refills | Status: AC
  Filled 2024-01-20: qty 90, 90d supply, fill #0
  Filled 2024-04-15: qty 90, 90d supply, fill #1

## 2024-01-21 ENCOUNTER — Encounter: Payer: Medicare Other | Admitting: Family Medicine

## 2024-01-22 ENCOUNTER — Ambulatory Visit: Attending: Cardiology | Admitting: Cardiology

## 2024-01-22 ENCOUNTER — Encounter: Payer: Self-pay | Admitting: Cardiology

## 2024-01-22 VITALS — BP 130/74 | HR 69 | Ht 65.0 in | Wt 155.8 lb

## 2024-01-22 DIAGNOSIS — Z794 Long term (current) use of insulin: Secondary | ICD-10-CM

## 2024-01-22 DIAGNOSIS — I10 Essential (primary) hypertension: Secondary | ICD-10-CM

## 2024-01-22 DIAGNOSIS — E119 Type 2 diabetes mellitus without complications: Secondary | ICD-10-CM | POA: Diagnosis not present

## 2024-01-22 DIAGNOSIS — I493 Ventricular premature depolarization: Secondary | ICD-10-CM

## 2024-01-22 DIAGNOSIS — E782 Mixed hyperlipidemia: Secondary | ICD-10-CM | POA: Diagnosis not present

## 2024-01-22 NOTE — Progress Notes (Signed)
 Cardiology Office Note:    Date:  01/23/2024   ID:  Victoria Walker, DOB 1951/01/14, MRN [id number]  PCP:  Domenica Harlene LABOR, MD  Cardiologist:  Dub Huntsman, DO  Electrophysiologist:  None   Referring MD: Domenica Harlene LABOR, MD     History of Present Illness:    Victoria Walker is a 73 y.o. female with a hx of arthritis, history of hiatal hernia hyperlipidemia, type 2 diabetes, diabetic neuropathy here for follow-up visit.  She previously followed with Dr. Raja Revankar and transition to cardiovascular care to me recently.  This is our first visit.  She wanted to review her preop prior testing result.  She also tells me she has been seen for palpitations.  One of the things that she shared with me is the fact that she has had lower blood pressure at nighttime and she splits her Avapro  at night and does not take the whole pill.  No chest pain or shortness of breath  Past Medical History:  Diagnosis Date   Arthritis    both knees (07/02/2015)   Cancer of right breast (HCC)    Claustrophobia    Dysrhythmia    HX OF FAST HEART RATE AND PALPITATIONS - METOPROLOL  HAS HELPED   GERD (gastroesophageal reflux disease)    H/O hiatal hernia    H/O iritis    LEFT EYE - STATES HER EYE BECOMES RED AND VERY SENSITIVE TO LIGHT WHEN FLARE UP OF IRITIS   Heart murmur    benign; 06/2015   High cholesterol    History of blood transfusion    when I had my partial hysterectomy   History of chickenpox 09/26/2014   History of measles    History of MRSA infection    History of shingles    Hypertension    Hypothyroidism    Medicare annual wellness visit, initial 04/07/2015   Multinodular thyroid     PT HAVING TROUBLE SWALLOWING   Neuropathy    FEET   Primary localized osteoarthritis of right knee    Sciatica of right side 04/25/2015   Spinal headache    spinal headache with epidural   Total knee replacement status, right    Type II diabetes mellitus (HCC)    ORAL MEDICATION - NO INSULIN     Wears glasses     Past Surgical History:  Procedure Laterality Date   ABDOMINAL HYSTERECTOMY  1988   ABDOMINOPLASTY  1986   ANTERIOR AND POSTERIOR REPAIR  05/11/2012   Procedure: ANTERIOR (CYSTOCELE) AND POSTERIOR REPAIR (RECTOCELE);  Surgeon: Glendia LABOR Elizabeth, MD;  Location: [facility];  Service: Urology;;  with graft   BREAST BIOPSY Right    CARDIAC CATHETERIZATION  1999   COLONOSCOPY     CYSTOSCOPY WITH URETHRAL DILATATION  05/11/2012   Procedure: CYSTOSCOPY WITH URETHRAL DILATATION;  Surgeon: Glendia LABOR Elizabeth, MD;  Location: [facility];  Service: Urology;;   ESOPHAGEAL MANOMETRY N/A 10/09/2021   Procedure: ESOPHAGEAL MANOMETRY (EM);  Surgeon: Wilhelmenia Aloha Raddle., MD;  Location: [facility];  Service: Gastroenterology;  Laterality: N/A;   ESOPHAGOGASTRODUODENOSCOPY     HERNIA REPAIR     MASTECTOMY Right 2007   PLANTAR FASCIA SURGERY Left    THYROIDECTOMY N/A 02/23/2014   Procedure: TOTAL THYROIDECTOMY;  Surgeon: Krystal Spinner, MD;  Location: [facility];  Service: General;  Laterality: N/A;   TOTAL KNEE ARTHROPLASTY Right 07/02/2015   TOTAL KNEE ARTHROPLASTY Right 07/02/2015   Procedure: TOTAL KNEE ARTHROPLASTY;  Surgeon: Lamar Millman, MD;  Location: [facility]  OR;  Service: Orthopedics;  Laterality: Right;   TOTAL THYROIDECTOMY  1976   removed three tumor (2 on right; 1 on left)   TUBAL LIGATION     UMBILICAL HERNIA REPAIR  1986   VAGINAL PROLAPSE REPAIR  05/11/2012   Procedure: VAGINAL VAULT SUSPENSION;  Surgeon: Glendia DELENA Elizabeth, MD;  Location: [facility];  Service: Urology;;   VULVA / PERINEUM BIOPSY     vulvar    Current Medications: Current Meds  Medication Sig   acetaminophen  (TYLENOL ) 325 MG tablet Take 2 tablets (650 mg total) by mouth every 6 (six) hours as needed for moderate pain (pain score 4-6).   alendronate  (FOSAMAX ) 70 MG tablet Take 1 tablet (70 mg total) by mouth once a week. Take with a full glass of water  on an empty stomach.   aspirin  81 MG EC tablet Take 81 mg by mouth  every other day. Swallow whole.   atorvastatin  (LIPITOR) 40 MG tablet Take 1 tablet (40 mg total) by mouth every other day alternating with 1/2 tablet.   Calcium  Carbonate-Vitamin D  (OS-CAL 500 + D PO)    Calcium  Polycarbophil (FIBER) 625 MG TABS Take 1 tablet by mouth daily.   Carboxymethylcellul-Glycerin (REFRESH OPTIVE OP) Apply to eye.   Cholecalciferol (VITAMIN D ) 2000 UNITS CAPS Take 1 capsule by mouth every evening.    Ciclopirox  1 % shampoo Apply 1(one) application(s) topically 2(two) times a week   cycloSPORINE  (RESTASIS ) 0.05 % ophthalmic emulsion Place 1 drop into both eyes 2 (two) times daily.   diltiazem  (CARDIZEM  CD) 120 MG 24 hr capsule Take 1 capsule (120 mg total) by mouth daily.   Estradiol  10 MCG TABS vaginal tablet Place 1 tablet (10 mcg total) vaginally every other day.   fluconazole  (DIFLUCAN ) 150 MG tablet Take 1 tablet (150 mg total) by mouth every 3 (three) days. For three doses   gabapentin  (NEURONTIN ) 300 MG capsule Take 1 capsule (300 mg total) by mouth at bedtime.   glucose blood (ONE TOUCH TEST STRIPS) test strip Use as directed twice daily to check blood sugar. DX code E11.9   irbesartan  (AVAPRO ) 150 MG tablet Take 0.5 tablets (75 mg total) by mouth daily.   levocetirizine (XYZAL ) 5 MG tablet Take 1 tablet (5 mg total) by mouth every evening.   levothyroxine  (SYNTHROID ) 88 MCG tablet Take 1 tablet (88 mcg total) by mouth daily. **SKIP SUNDAY**   loteprednol  (LOTEMAX ) 0.5 % ophthalmic suspension Place 1 drop into both eyes 4 (four) times daily with a flare up.   metFORMIN  (GLUCOPHAGE -XR) 750 MG 24 hr tablet Take 1 tablet (750 mg total) by mouth in the morning and at bedtime.   mometasone  (NASONEX ) 50 MCG/ACT nasal spray Place 1-2 sprays in each nostril daily as needed for stuffy nose.   NEXIUM  40 MG capsule Take 1 capsule (40 mg total) by mouth daily.   Probiotic Product (PROBIOTIC DAILY PO) Take 1 capsule by mouth daily.   spironolactone  (ALDACTONE ) 25 MG tablet  Take 1.5 tablets (37.5 mg total) by mouth daily.     Allergies:   Paxlovid  [nirmatrelvir -ritonavir ], Codeine, Penicillins, Sulfa antibiotics, and Vicodin [hydrocodone -acetaminophen ]   Social History   Socioeconomic History   Marital status: Married    Spouse name: Not on file   Number of children: 3   Years of education: Not on file   Highest education level: Not on file  Occupational History    Comment: Retired  Tobacco Use   Smoking status: Never   Smokeless tobacco:  Never  Vaping Use   Vaping status: Never Used  Substance and Sexual Activity   Alcohol  use: Not Currently   Drug use: Not Currently   Sexual activity: Not Currently    Birth control/protection: Surgical    Comment: lives with husband, retired from various jobs, diabetic diet  Other Topics Concern   Not on file  Social History Narrative   Not on file   Social Drivers of Health   Financial Resource Strain: Low Risk  (12/08/2023)   Overall Financial Resource Strain (CARDIA)    Difficulty of Paying Living Expenses: Not hard at all  Food Insecurity: No Food Insecurity (12/08/2023)   Hunger Vital Sign    Worried About Running Out of Food in the Last Year: Never true    Ran Out of Food in the Last Year: Never true  Transportation Needs: No Transportation Needs (12/08/2023)   PRAPARE - Administrator, Civil Service (Medical): No    Lack of Transportation (Non-Medical): No  Physical Activity: Inactive (12/08/2023)   Exercise Vital Sign    Days of Exercise per Week: 0 days    Minutes of Exercise per Session: 0 min  Stress: No Stress Concern Present (12/08/2023)   [employer] of Occupational Health - Occupational Stress Questionnaire    Feeling of Stress: Not at all  Social Connections: Socially Integrated (12/08/2023)   Social Connection and Isolation Panel    Frequency of Communication with Friends and Family: More than three times a week    Frequency of Social Gatherings with Friends and Family:  Three times a week    Attends Religious Services: More than 4 times per year    Active Member of Clubs or Organizations: Yes    Attends Engineer, structural: More than 4 times per year    Marital Status: Married     Family History: The patient's family history includes Alcohol  abuse in her father; Arthritis in her sister and son; Benign prostatic hyperplasia in her brother; Breast cancer in her sister and sister; Cancer in her paternal aunt; Diabetes in her brother, daughter, father, sister, and sister; Endometriosis in her sister; Heart disease in her maternal grandmother and mother; Hyperlipidemia in her brother, brother, brother, daughter, mother, and sister; Hypertension in her brother, brother, brother, daughter, daughter, father, maternal aunt, maternal grandfather, maternal grandmother, maternal uncle, mother, paternal aunt, paternal uncle, sister, sister, and sister; Kidney Stones in her mother; Kidney disease in her brother, mother, and sister; Lung cancer in her father; Stroke in her brother and maternal grandmother; Thyroid  cancer in her sister; Thyroid  disease in her sister and sister. There is no history of Colon cancer, Stomach cancer, Esophageal cancer, Pancreatic cancer, Liver disease, Rectal cancer, or Inflammatory bowel disease.  ROS:   Review of Systems  Constitution: Negative for decreased appetite, fever and weight gain.  HENT: Negative for congestion, ear discharge, hoarse voice and sore throat.   Eyes: Negative for discharge, redness, vision loss in right eye and visual halos.  Cardiovascular: Negative for chest pain, dyspnea on exertion, leg swelling, orthopnea and palpitations.  Respiratory: Negative for cough, hemoptysis, shortness of breath and snoring.   Endocrine: Negative for heat intolerance and polyphagia.  Hematologic/Lymphatic: Negative for bleeding problem. Does not bruise/bleed easily.  Skin: Negative for flushing, nail changes, rash and suspicious  lesions.  Musculoskeletal: Negative for arthritis, joint pain, muscle cramps, myalgias, neck pain and stiffness.  Gastrointestinal: Negative for abdominal pain, bowel incontinence, diarrhea and excessive appetite.  Genitourinary: Negative  for decreased libido, genital sores and incomplete emptying.  Neurological: Negative for brief paralysis, focal weakness, headaches and loss of balance.  Psychiatric/Behavioral: Negative for altered mental status, depression and suicidal ideas.  Allergic/Immunologic: Negative for HIV exposure and persistent infections.    EKGs/Labs/Other Studies Reviewed:    The following studies were reviewed today:   EKG:  The ekg ordered today demonstrates   Recent Labs: 06/15/2023: ALT 15; BUN 19; Creat 0.78; Hemoglobin 13.0; Platelets 296.0; Potassium 4.6; Sodium 139; TSH 0.49  Recent Lipid Panel    Component Value Date/Time   CHOL 158 06/15/2023 1216   TRIG 117 06/15/2023 1216   HDL 51 06/15/2023 1216   CHOLHDL 3.1 06/15/2023 1216   VLDL 37.6 12/31/2022 1349   LDLCALC 86 06/15/2023 1216   LDLDIRECT 107.0 07/01/2016 1628    Physical Exam:    VS:  BP 130/74   Pulse 69   Ht 5' 5 (1.651 m)   Wt 155 lb 12.8 oz (70.7 kg)   SpO2 99%   BMI 25.93 kg/m     Wt Readings from Last 3 Encounters:  01/22/24 155 lb 12.8 oz (70.7 kg)  12/08/23 160 lb (72.6 kg)  10/21/23 160 lb (72.6 kg)     GEN: Well nourished, well developed in no acute distress HEENT: Normal NECK: No JVD; No carotid bruits LYMPHATICS: No lymphadenopathy CARDIAC: S1S2 noted,RRR, no murmurs, rubs, gallops RESPIRATORY:  Clear to auscultation without rales, wheezing or rhonchi  ABDOMEN: Soft, non-tender, non-distended, +bowel sounds, no guarding. EXTREMITIES: No edema, No cyanosis, no clubbing MUSCULOSKELETAL:  No deformity  SKIN: Warm and dry NEUROLOGIC:  Alert and oriented x 3, non-focal PSYCHIATRIC:  Normal affect, good insight  ASSESSMENT:    1. PVC (premature ventricular  contraction)   2. Essential hypertension   3. Hyperlipidemia, mixed   4. Insulin -requiring or dependent type II diabetes mellitus (HCC)    PLAN:     1.  PVCs-she is tolerating the Cardizem  we will continue this at this time.  I reviewed her ZIO monitor results with her again which was done in July.  Advised the patient to adjust diet in terms of decreasing caffeine and high sugars as well.  2.  Hypertension blood pressure in office is at target no medication changes at this time  3.  Hyperlipidemia - continue with current statin medication.  4.  Diabetes being managed by her primary doctor  The patient is in agreement with the above plan. The patient left the office in stable condition.  The patient will follow up in   Medication Adjustments/Labs and Tests Ordered: Current medicines are reviewed at length with the patient today.  Concerns regarding medicines are outlined above.  No orders of the defined types were placed in this encounter.  No orders of the defined types were placed in this encounter.   Patient Instructions  Medication Instructions:  Your physician recommends that you continue on your current medications as directed. Please refer to the Current Medication list given to you today.  *If you need a refill on your cardiac medications before your next appointment, please call your pharmacy*   Follow-Up: At [facility], you and your health needs are our priority.  As part of our continuing mission to provide you with exceptional heart care, our providers are all part of one team.  This team includes your primary Cardiologist (physician) and Advanced Practice Providers or APPs (Physician Assistants and Nurse Practitioners) who all work together to provide you with the care  you need, when you need it.  Your next appointment:   9 month(s)  Provider:   Shanyah Gattuso, DO             Adopting a Healthy Lifestyle.  Know what a healthy weight is for you  (roughly BMI <25) and aim to maintain this   Aim for 7+ servings of fruits and vegetables daily   65-80+ fluid ounces of water  or unsweet tea for healthy kidneys   Limit to max 1 drink of alcohol  per day; avoid smoking/tobacco   Limit animal fats in diet for cholesterol and heart health - choose grass fed whenever available   Avoid highly processed foods, and foods high in saturated/trans fats   Aim for low stress - take time to unwind and care for your mental health   Aim for 150 min of moderate intensity exercise weekly for heart health, and weights twice weekly for bone health   Aim for 7-9 hours of sleep daily   When it comes to diets, agreement about the perfect plan isnt easy to find, even among the experts. Experts at the [facility] of [employer] developed an idea known as the Healthy Eating Plate. Just imagine a plate divided into logical, healthy portions.   The emphasis is on diet quality:   Load up on vegetables and fruits - one-half of your plate: Aim for color and variety, and remember that potatoes dont count.   Go for whole grains - one-quarter of your plate: Whole wheat, barley, wheat berries, quinoa, oats, brown rice, and foods made with them. If you want pasta, go with whole wheat pasta.   Protein power - one-quarter of your plate: Fish, chicken, beans, and nuts are all healthy, versatile protein sources. Limit red meat.   The diet, however, does go beyond the plate, offering a few other suggestions.   Use healthy plant oils, such as olive, canola, soy, corn, sunflower and peanut. Check the labels, and avoid partially hydrogenated oil, which have unhealthy trans fats.   If youre thirsty, drink water . Coffee and tea are good in moderation, but skip sugary drinks and limit milk and dairy products to one or two daily servings.   The type of carbohydrate in the diet is more important than the amount. Some sources of carbohydrates, such as vegetables, fruits,  whole grains, and beans-are healthier than others.   Finally, stay active  Signed, Dub Huntsman, DO  01/23/2024 10:17 PM    [facility]

## 2024-01-22 NOTE — Patient Instructions (Addendum)
 Medication Instructions:  Your physician recommends that you continue on your current medications as directed. Please refer to the Current Medication list given to you today.  *If you need a refill on your cardiac medications before your next appointment, please call your pharmacy*   Follow-Up: At [facility], you and your health needs are our priority.  As part of our continuing mission to provide you with exceptional heart care, our providers are all part of one team.  This team includes your primary Cardiologist (physician) and Advanced Practice Providers or APPs (Physician Assistants and Nurse Practitioners) who all work together to provide you with the care you need, when you need it.  Your next appointment:   9 month(s)  Provider:   Kardie Tobb, DO

## 2024-02-04 ENCOUNTER — Ambulatory Visit: Admitting: Family Medicine

## 2024-02-04 ENCOUNTER — Encounter: Payer: Self-pay | Admitting: Family Medicine

## 2024-02-04 VITALS — BP 122/62 | HR 80 | Temp 98.4°F | Resp 16 | Ht 65.0 in | Wt 157.0 lb

## 2024-02-04 DIAGNOSIS — E538 Deficiency of other specified B group vitamins: Secondary | ICD-10-CM

## 2024-02-04 DIAGNOSIS — Z1239 Encounter for other screening for malignant neoplasm of breast: Secondary | ICD-10-CM | POA: Diagnosis not present

## 2024-02-04 DIAGNOSIS — E1140 Type 2 diabetes mellitus with diabetic neuropathy, unspecified: Secondary | ICD-10-CM

## 2024-02-04 DIAGNOSIS — I10 Essential (primary) hypertension: Secondary | ICD-10-CM | POA: Diagnosis not present

## 2024-02-04 DIAGNOSIS — E782 Mixed hyperlipidemia: Secondary | ICD-10-CM | POA: Diagnosis not present

## 2024-02-04 DIAGNOSIS — M8580 Other specified disorders of bone density and structure, unspecified site: Secondary | ICD-10-CM

## 2024-02-04 DIAGNOSIS — E039 Hypothyroidism, unspecified: Secondary | ICD-10-CM | POA: Diagnosis not present

## 2024-02-04 DIAGNOSIS — Z23 Encounter for immunization: Secondary | ICD-10-CM | POA: Diagnosis not present

## 2024-02-04 DIAGNOSIS — E0842 Diabetes mellitus due to underlying condition with diabetic polyneuropathy: Secondary | ICD-10-CM | POA: Diagnosis not present

## 2024-02-04 NOTE — Patient Instructions (Signed)
 Prevnar 20 booster anytime in next 2 months  Covid booster  Tetanus is due in 2026 or sooner if injured  All [facility] pharmacies are now walk in vaccine clinics M-F 9-4

## 2024-02-04 NOTE — Assessment & Plan Note (Signed)
 Control glucose levels, stay active and monitor.

## 2024-02-05 ENCOUNTER — Ambulatory Visit: Payer: Self-pay | Admitting: Family Medicine

## 2024-02-05 LAB — CBC WITH DIFFERENTIAL/PLATELET
Basophils Absolute: 0 K/uL (ref 0.0–0.1)
Basophils Relative: 0.6 % (ref 0.0–3.0)
Eosinophils Absolute: 0.1 K/uL (ref 0.0–0.7)
Eosinophils Relative: 1.3 % (ref 0.0–5.0)
HCT: 38.8 % (ref 36.0–46.0)
Hemoglobin: 12.5 g/dL (ref 12.0–15.0)
Lymphocytes Relative: 35.9 % (ref 12.0–46.0)
Lymphs Abs: 1.9 K/uL (ref 0.7–4.0)
MCHC: 32.1 g/dL (ref 30.0–36.0)
MCV: 93 fl (ref 78.0–100.0)
Monocytes Absolute: 0.5 K/uL (ref 0.1–1.0)
Monocytes Relative: 9.7 % (ref 3.0–12.0)
Neutro Abs: 2.8 K/uL (ref 1.4–7.7)
Neutrophils Relative %: 52.5 % (ref 43.0–77.0)
Platelets: 294 K/uL (ref 150.0–400.0)
RBC: 4.17 Mil/uL (ref 3.87–5.11)
RDW: 13.8 % (ref 11.5–15.5)
WBC: 5.3 K/uL (ref 4.0–10.5)

## 2024-02-05 LAB — HEMOGLOBIN A1C: Hgb A1c MFr Bld: 6.7 % — ABNORMAL HIGH (ref 4.6–6.5)

## 2024-02-05 LAB — COMPREHENSIVE METABOLIC PANEL WITH GFR
ALT: 11 U/L (ref 0–35)
AST: 13 U/L (ref 0–37)
Albumin: 4.4 g/dL (ref 3.5–5.2)
Alkaline Phosphatase: 47 U/L (ref 39–117)
BUN: 14 mg/dL (ref 6–23)
CO2: 30 meq/L (ref 19–32)
Calcium: 9.3 mg/dL (ref 8.4–10.5)
Chloride: 102 meq/L (ref 96–112)
Creatinine, Ser: 1 mg/dL (ref 0.40–1.20)
GFR: 55.62 mL/min — ABNORMAL LOW (ref >60.00)
Glucose, Bld: 57 mg/dL — ABNORMAL LOW (ref 70–99)
Potassium: 4.5 meq/L (ref 3.5–5.1)
Sodium: 141 meq/L (ref 135–145)
Total Bilirubin: 0.4 mg/dL (ref 0.2–1.2)
Total Protein: 6.5 g/dL (ref 6.0–8.3)

## 2024-02-05 LAB — TSH: TSH: 0.49 u[IU]/mL (ref 0.35–5.50)

## 2024-02-05 LAB — LIPID PANEL
Cholesterol: 149 mg/dL (ref 0–200)
HDL: 45 mg/dL (ref >39.00)
LDL Cholesterol: 75 mg/dL (ref 0–99)
NonHDL: 103.81
Total CHOL/HDL Ratio: 3
Triglycerides: 144 mg/dL (ref 0.0–149.0)
VLDL: 28.8 mg/dL (ref 0.0–40.0)

## 2024-02-05 LAB — VITAMIN B12: Vitamin B-12: 226 pg/mL (ref 211–911)

## 2024-02-07 NOTE — Progress Notes (Signed)
 Subjective:    Patient ID: [name], female    DOB: Sep 06, 1950, 73 y.o.   MRN: [id number]  Chief Complaint  Patient presents with   Hypertension    Here for follow up    HPI Discussed the use of AI scribe software for clinical note transcription with the patient, who gave verbal consent to proceed.  History of Present Illness AMYAH CLAWSON is a 73 year old female who presents for a routine follow-up.  Patient has recently been evaluated by cardiology and diagnosed with PVCs. No recent symptomatic episodes.  She has a history of vitamin B12 deficiency due to a positive intrinsic factor, indicating an inability to absorb vitamin B12 through the stomach. She was previously on B12 injections, which were discontinued due to high levels. She is not currently taking any B12 supplements.  Her husband has Parkinson's disease and has been hospitalized four times since July. Following ACDF surgery on his neck, he experienced blood pressure drops and has been unable to walk. He is currently in rehabilitation.  She has a history of breast cancer, diagnosed in 2007, which is now in remission. She has been cancer-free for 18 years, and her recent mammogram was reassuring.  She has received her flu shot and RSV vaccine. She did not receive the COVID vaccine last year but is considering it this year. She experienced significant side effects from the RSV vaccine.  She has a history of osteopenia and has been increasing her calcium  intake by consuming more yogurt. She has not been informed of any vitamin D  deficiency.    Past Medical History:  Diagnosis Date   Arthritis    both knees (07/02/2015)   Cancer of right breast (HCC)    Claustrophobia    Dysrhythmia    HX OF FAST HEART RATE AND PALPITATIONS - METOPROLOL  HAS HELPED   GERD (gastroesophageal reflux disease)    H/O hiatal hernia    H/O iritis    LEFT EYE - STATES HER EYE BECOMES RED AND VERY SENSITIVE TO LIGHT WHEN  FLARE UP OF IRITIS   Heart murmur    benign; 06/2015   High cholesterol    History of blood transfusion    when I had my partial hysterectomy   History of chickenpox 09/26/2014   History of measles    History of MRSA infection    History of shingles    Hypertension    Hypothyroidism    Medicare annual wellness visit, initial 04/07/2015   Multinodular thyroid     PT HAVING TROUBLE SWALLOWING   Neuropathy    FEET   Primary localized osteoarthritis of right knee    Sciatica of right side 04/25/2015   Spinal headache    spinal headache with epidural   Total knee replacement status, right    Type II diabetes mellitus (HCC)    ORAL MEDICATION - NO INSULIN    Wears glasses     Past Surgical History:  Procedure Laterality Date   ABDOMINAL HYSTERECTOMY  1988   ABDOMINOPLASTY  1986   ANTERIOR AND POSTERIOR REPAIR  05/11/2012   Procedure: ANTERIOR (CYSTOCELE) AND POSTERIOR REPAIR (RECTOCELE);  Surgeon: Glendia DELENA Elizabeth, MD;  Location: [facility];  Service: Urology;;  with graft   BREAST BIOPSY Right    CARDIAC CATHETERIZATION  1999   COLONOSCOPY     CYSTOSCOPY WITH URETHRAL DILATATION  05/11/2012   Procedure: CYSTOSCOPY WITH URETHRAL DILATATION;  Surgeon: Glendia DELENA Elizabeth, MD;  Location: [facility];  Service: Urology;;  ESOPHAGEAL MANOMETRY N/A 10/09/2021   Procedure: ESOPHAGEAL MANOMETRY (EM);  Surgeon: Wilhelmenia Aloha Raddle., MD;  Location: [facility];  Service: Gastroenterology;  Laterality: N/A;   ESOPHAGOGASTRODUODENOSCOPY     HERNIA REPAIR     MASTECTOMY Right 2007   PLANTAR FASCIA SURGERY Left    THYROIDECTOMY N/A 02/23/2014   Procedure: TOTAL THYROIDECTOMY;  Surgeon: Krystal Spinner, MD;  Location: [facility];  Service: General;  Laterality: N/A;   TOTAL KNEE ARTHROPLASTY Right 07/02/2015   TOTAL KNEE ARTHROPLASTY Right 07/02/2015   Procedure: TOTAL KNEE ARTHROPLASTY;  Surgeon: Lamar Millman, MD;  Location: [facility];  Service: Orthopedics;  Laterality: Right;   TOTAL THYROIDECTOMY  1976    removed three tumor (2 on right; 1 on left)   TUBAL LIGATION     UMBILICAL HERNIA REPAIR  1986   VAGINAL PROLAPSE REPAIR  05/11/2012   Procedure: VAGINAL VAULT SUSPENSION;  Surgeon: Glendia DELENA Elizabeth, MD;  Location: [facility];  Service: Urology;;   VULVA / PERINEUM BIOPSY     vulvar    Family History  Problem Relation Age of Onset   Hypertension Maternal Grandmother    Heart disease Maternal Grandmother    Stroke Maternal Grandmother    Hypertension Maternal Grandfather    Hypertension Father    Diabetes Father    Alcohol  abuse Father    Lung cancer Father        lung, psa   Hypertension Mother    Heart disease Mother        pacer, CHF   Hyperlipidemia Mother    Kidney disease Mother        1 kidney due to stone   Kidney Stones Mother    Diabetes Brother    Hypertension Brother    Hyperlipidemia Brother    Kidney disease Brother        dialysis   Stroke Brother    Hypertension Brother    Hyperlipidemia Brother    Hypertension Brother    Hyperlipidemia Brother    Benign prostatic hyperplasia Brother    Diabetes Sister    Hypertension Sister    Breast cancer Sister    Hyperlipidemia Sister    Hypertension Sister    Diabetes Sister    Thyroid  disease Sister    Kidney disease Sister    Endometriosis Sister    Breast cancer Sister        thyroid  and breast   Thyroid  cancer Sister    Arthritis Sister        back pain   Hypertension Sister    Thyroid  disease Sister    Hypertension Daughter    Hypertension Daughter    Hyperlipidemia Daughter    Diabetes Daughter    Arthritis Son    Hypertension Maternal Aunt    Hypertension Maternal Uncle    Hypertension Paternal Aunt    Cancer Paternal Aunt    Hypertension Paternal Uncle    Colon cancer Neg Hx    Stomach cancer Neg Hx    Esophageal cancer Neg Hx    Pancreatic cancer Neg Hx    Liver disease Neg Hx    Rectal cancer Neg Hx    Inflammatory bowel disease Neg Hx     Social History   Socioeconomic History    Marital status: Married    Spouse name: Not on file   Number of children: 3   Years of education: Not on file   Highest education level: Not on file  Occupational History    Comment:  Retired  Tobacco Use   Smoking status: Never   Smokeless tobacco: Never  Vaping Use   Vaping status: Never Used  Substance and Sexual Activity   Alcohol  use: Not Currently   Drug use: Not Currently   Sexual activity: Not Currently    Birth control/protection: Surgical    Comment: lives with husband, retired from various jobs, diabetic diet  Other Topics Concern   Not on file  Social History Narrative   Not on file   Social Drivers of Health   Financial Resource Strain: Low Risk  (12/08/2023)   Overall Financial Resource Strain (CARDIA)    Difficulty of Paying Living Expenses: Not hard at all  Food Insecurity: No Food Insecurity (12/08/2023)   Hunger Vital Sign    Worried About Running Out of Food in the Last Year: Never true    Ran Out of Food in the Last Year: Never true  Transportation Needs: No Transportation Needs (12/08/2023)   PRAPARE - Administrator, Civil Service (Medical): No    Lack of Transportation (Non-Medical): No  Physical Activity: Inactive (12/08/2023)   Exercise Vital Sign    Days of Exercise per Week: 0 days    Minutes of Exercise per Session: 0 min  Stress: No Stress Concern Present (12/08/2023)   [employer] of Occupational Health - Occupational Stress Questionnaire    Feeling of Stress: Not at all  Social Connections: Socially Integrated (12/08/2023)   Social Connection and Isolation Panel    Frequency of Communication with Friends and Family: More than three times a week    Frequency of Social Gatherings with Friends and Family: Three times a week    Attends Religious Services: More than 4 times per year    Active Member of Clubs or Organizations: Yes    Attends Banker Meetings: More than 4 times per year    Marital Status: Married   Catering manager Violence: Not At Risk (12/08/2023)   Humiliation, Afraid, Rape, and Kick questionnaire    Fear of Current or Ex-Partner: No    Emotionally Abused: No    Physically Abused: No    Sexually Abused: No    Outpatient Medications Prior to Visit  Medication Sig Dispense Refill   acetaminophen  (TYLENOL ) 325 MG tablet Take 2 tablets (650 mg total) by mouth every 6 (six) hours as needed for moderate pain (pain score 4-6). 100 tablet 0   alendronate  (FOSAMAX ) 70 MG tablet Take 1 tablet (70 mg total) by mouth once a week. Take with a full glass of water  on an empty stomach. 12 tablet 3   aspirin  81 MG EC tablet Take 81 mg by mouth every other day. Swallow whole.     atorvastatin  (LIPITOR) 40 MG tablet Take 1 tablet (40 mg total) by mouth every other day alternating with 1/2 tablet. 180 tablet 1   Calcium  Carbonate-Vitamin D  (OS-CAL 500 + D PO)      Calcium  Polycarbophil (FIBER) 625 MG TABS Take 1 tablet by mouth daily.     Carboxymethylcellul-Glycerin (REFRESH OPTIVE OP) Apply to eye.     Cholecalciferol (VITAMIN D ) 2000 UNITS CAPS Take 1 capsule by mouth every evening.      Ciclopirox  1 % shampoo Apply 1(one) application(s) topically 2(two) times a week 120 mL 11   cycloSPORINE  (RESTASIS ) 0.05 % ophthalmic emulsion Place 1 drop into both eyes 2 (two) times daily. 180 each 3   diltiazem  (CARDIZEM  CD) 120 MG 24 hr capsule Take 1 capsule (  120 mg total) by mouth daily. 90 capsule 1   Estradiol  10 MCG TABS vaginal tablet Place 1 tablet (10 mcg total) vaginally every other day. 48 tablet 4   fluconazole  (DIFLUCAN ) 150 MG tablet Take 1 tablet (150 mg total) by mouth every 3 (three) days. For three doses 3 tablet 3   gabapentin  (NEURONTIN ) 300 MG capsule Take 1 capsule (300 mg total) by mouth at bedtime. 90 capsule 1   glucose blood (ONE TOUCH TEST STRIPS) test strip Use as directed twice daily to check blood sugar. DX code E11.9 300 each 1   irbesartan  (AVAPRO ) 150 MG tablet Take 0.5 tablets  (75 mg total) by mouth daily.     irbesartan  (AVAPRO ) 150 MG tablet Take 1 tablet (150 mg total) by mouth daily. 90 tablet 3   levocetirizine (XYZAL ) 5 MG tablet Take 1 tablet (5 mg total) by mouth every evening. 90 tablet 3   levothyroxine  (SYNTHROID ) 88 MCG tablet Take 1 tablet (88 mcg total) by mouth daily. **SKIP SUNDAY** 90 tablet 1   loteprednol  (LOTEMAX ) 0.5 % ophthalmic suspension Place 1 drop into both eyes 4 (four) times daily with a flare up. 5 mL 3   metFORMIN  (GLUCOPHAGE -XR) 750 MG 24 hr tablet Take 1 tablet (750 mg total) by mouth in the morning and at bedtime. 180 tablet 1   mometasone  (NASONEX ) 50 MCG/ACT nasal spray Place 1-2 sprays in each nostril daily as needed for stuffy nose. 51 g 3   NEXIUM  40 MG capsule Take 1 capsule (40 mg total) by mouth daily. 90 capsule 1   Probiotic Product (PROBIOTIC DAILY PO) Take 1 capsule by mouth daily.     spironolactone  (ALDACTONE ) 25 MG tablet Take 1.5 tablets (37.5 mg total) by mouth daily. 135 tablet 1   No facility-administered medications prior to visit.    Allergies  Allergen Reactions   Paxlovid  [Nirmatrelvir -Ritonavir ]    Codeine Anxiety    Jittery    Penicillins Itching   Sulfa Antibiotics Itching   Vicodin [Hydrocodone -Acetaminophen ] Itching    ITCHING     Review of Systems  Constitutional:  Positive for malaise/fatigue. Negative for fever.  HENT:  Negative for congestion.   Eyes:  Negative for blurred vision.  Respiratory:  Negative for shortness of breath.   Cardiovascular:  Negative for chest pain, palpitations and leg swelling.  Gastrointestinal:  Negative for abdominal pain, blood in stool and nausea.  Genitourinary:  Negative for dysuria and frequency.  Musculoskeletal:  Negative for falls.  Skin:  Negative for rash.  Neurological:  Negative for dizziness, loss of consciousness and headaches.  Endo/Heme/Allergies:  Negative for environmental allergies.  Psychiatric/Behavioral:  Negative for depression. The  patient is nervous/anxious.        Objective:    Physical Exam Constitutional:      General: She is not in acute distress.    Appearance: Normal appearance. She is well-developed. She is not toxic-appearing.  HENT:     Head: Normocephalic and atraumatic.     Right Ear: External ear normal.     Left Ear: External ear normal.     Nose: Nose normal.  Eyes:     General:        Right eye: No discharge.        Left eye: No discharge.     Conjunctiva/sclera: Conjunctivae normal.  Neck:     Thyroid : No thyromegaly.  Cardiovascular:     Rate and Rhythm: Normal rate and regular rhythm.  Heart sounds: Normal heart sounds. No murmur heard. Pulmonary:     Effort: Pulmonary effort is normal. No respiratory distress.     Breath sounds: Normal breath sounds.  Abdominal:     General: Bowel sounds are normal.     Palpations: Abdomen is soft.     Tenderness: There is no abdominal tenderness. There is no guarding.  Musculoskeletal:        General: Normal range of motion.     Cervical back: Neck supple.  Lymphadenopathy:     Cervical: No cervical adenopathy.  Skin:    General: Skin is warm and dry.  Neurological:     Mental Status: She is alert and oriented to person, place, and time.  Psychiatric:        Mood and Affect: Mood normal.        Behavior: Behavior normal.        Thought Content: Thought content normal.        Judgment: Judgment normal.     BP 122/62 (BP Location: Left Arm, Patient Position: Sitting, Cuff Size: Small)   Pulse 80   Temp 98.4 F (36.9 C) (Oral)   Resp 16   Ht 5' 5 (1.651 m)   Wt 157 lb (71.2 kg)   SpO2 100%   BMI 26.13 kg/m  Wt Readings from Last 3 Encounters:  02/04/24 157 lb (71.2 kg)  01/22/24 155 lb 12.8 oz (70.7 kg)  12/08/23 160 lb (72.6 kg)    Diabetic Foot Exam - Simple   No data filed    Lab Results  Component Value Date   WBC 5.3 02/04/2024   HGB 12.5 02/04/2024   HCT 38.8 02/04/2024   PLT 294.0 02/04/2024   GLUCOSE 57 (L)  02/04/2024   CHOL 149 02/04/2024   TRIG 144.0 02/04/2024   HDL 45.00 02/04/2024   LDLDIRECT 107.0 07/01/2016   LDLCALC 75 02/04/2024   ALT 11 02/04/2024   AST 13 02/04/2024   NA 141 02/04/2024   K 4.5 02/04/2024   CL 102 02/04/2024   CREATININE 1.00 02/04/2024   BUN 14 02/04/2024   CO2 30 02/04/2024   TSH 0.49 02/04/2024   INR 1.01 06/21/2015   HGBA1C 6.7 (H) 02/04/2024   MICROALBUR <0.7 06/15/2023    Lab Results  Component Value Date   TSH 0.49 02/04/2024   Lab Results  Component Value Date   WBC 5.3 02/04/2024   HGB 12.5 02/04/2024   HCT 38.8 02/04/2024   MCV 93.0 02/04/2024   PLT 294.0 02/04/2024   Lab Results  Component Value Date   NA 141 02/04/2024   K 4.5 02/04/2024   CO2 30 02/04/2024   GLUCOSE 57 (L) 02/04/2024   BUN 14 02/04/2024   CREATININE 1.00 02/04/2024   BILITOT 0.4 02/04/2024   ALKPHOS 47 02/04/2024   AST 13 02/04/2024   ALT 11 02/04/2024   PROT 6.5 02/04/2024   ALBUMIN 4.4 02/04/2024   CALCIUM  9.3 02/04/2024   ANIONGAP 9 03/13/2021   GFR 55.62 (L) 02/04/2024   Lab Results  Component Value Date   CHOL 149 02/04/2024   Lab Results  Component Value Date   HDL 45.00 02/04/2024   Lab Results  Component Value Date   LDLCALC 75 02/04/2024   Lab Results  Component Value Date   TRIG 144.0 02/04/2024   Lab Results  Component Value Date   CHOLHDL 3 02/04/2024   Lab Results  Component Value Date   HGBA1C 6.7 (H) 02/04/2024  Assessment & Plan:  Needs flu shot -     Flu vaccine HIGH DOSE PF(Fluzone Trivalent)  Diabetic polyneuropathy associated with diabetes mellitus due to underlying condition [facility]) Assessment & Plan: Control glucose levels, stay active and monitor.   Orders: -     Comprehensive metabolic panel with GFR -     Hemoglobin A1c -     Vitamin D  1,25 dihydroxy  Breast cancer screening, high risk patient  Essential hypertension Assessment & Plan: Well controlled, no changes to meds. Encouraged heart  healthy diet such as the DASH diet and exercise as tolerated.   Orders: -     Comprehensive metabolic panel with GFR -     CBC with Differential/Platelet -     Vitamin D  1,25 dihydroxy -     TSH  Type 2 diabetes mellitus with diabetic neuropathy, without long-term current use of insulin  (HCC) Assessment & Plan: hgba1c acceptable, minimize simple carbs. Increase exercise as tolerated. Continue current meds  Orders: -     Comprehensive metabolic panel with GFR -     Hemoglobin A1c  Hyperlipidemia, mixed Assessment & Plan: Tolerating statin, encouraged heart healthy diet, avoid trans fats, minimize simple carbs and saturated fats. Increase exercise as tolerated  Orders: -     Lipid panel  Vitamin B12 deficiency -     Vitamin B12  Osteopenia, unspecified location -     Vitamin D  1,25 dihydroxy  Hypothyroidism, unspecified type Assessment & Plan: On Levothyroxine , continue to monitor     Assessment and Plan Assessment & Plan Essential hypertension Blood pressure management discussed. She has not been checking blood pressure regularly due to personal circumstances. No acute issues reported. - Encourage regular blood pressure monitoring at home  Premature ventricular contractions (PVCs) PVCs are a normal phenomenon unless frequent or symptomatic. No current symptoms reported. Cardiologist follow-up scheduled in nine months. - Monitor for symptoms such as increased frequency, sweating, or shortness of breath - Follow-up with cardiologist in nine months  Osteopenia and age-related osteoporosis without current pathological fracture Osteopenia is well-managed. She reports consuming more yogurt for calcium  intake. No recent bone density test results discussed. - Continue current calcium  intake - Monitor bone density as needed  Vitamin B12 deficiency due to intrinsic factor deficiency History of high B12 levels with injections, currently not on supplementation. Discussed  positive intrinsic factor and potential need for sublingual B12 supplementation. Plan to recheck B12 levels before making decisions. - Order B12 level - Consider sublingual B12 supplementation based on lab results  Breast cancer Breast cancer in remission since 2007. No current treatment. Recent mammogram was reassuring.  General Health Maintenance Discussed various vaccinations and health maintenance. She received flu shot recently. Discussed COVID booster and RSV vaccine. Tetanus booster due in 2026. Discussed new Prevnar 20 vaccine for pneumonia. - Recommend COVID booster - Recommend Prevnar 20 vaccine within the next two months - Schedule tetanus booster in 2026 or sooner if injured - Encourage regular health maintenance visits  Goals of Care Discussed challenges in caring for her husband with Parkinson's and dementia. Plans to bring him home with [facility] support but open to long-term care if needed. Emphasized the importance of self-care and recognizing personal limits.  Follow-up Routine follow-up discussed. She is doing well overall. - Schedule follow-up in 6 to 12 months - Contact clinic if any issues arise  Recording duration: 23 minutes     Harlene Horton, MD

## 2024-02-07 NOTE — Assessment & Plan Note (Signed)
 [id number] acceptable, minimize simple carbs. Increase exercise as tolerated. Continue current meds

## 2024-02-07 NOTE — Assessment & Plan Note (Signed)
 Well controlled, no changes to meds. Encouraged heart healthy diet such as the DASH diet and exercise as tolerated.

## 2024-02-07 NOTE — Assessment & Plan Note (Signed)
 Tolerating statin, encouraged heart healthy diet, avoid trans fats, minimize simple carbs and saturated fats. Increase exercise as tolerated

## 2024-02-07 NOTE — Assessment & Plan Note (Signed)
 On Levothyroxine, continue to monitor

## 2024-02-08 LAB — VITAMIN D 1,25 DIHYDROXY
Vitamin D 1, 25 (OH)2 Total: 49 pg/mL (ref 18–72)
Vitamin D2 1, 25 (OH)2: <8 pg/mL
Vitamin D3 1, 25 (OH)2: 49 pg/mL

## 2024-02-12 ENCOUNTER — Other Ambulatory Visit (HOSPITAL_BASED_OUTPATIENT_CLINIC_OR_DEPARTMENT_OTHER): Payer: Self-pay

## 2024-02-12 ENCOUNTER — Other Ambulatory Visit: Payer: Self-pay

## 2024-02-12 ENCOUNTER — Other Ambulatory Visit: Payer: Self-pay | Admitting: Family Medicine

## 2024-02-12 MED ORDER — SPIRONOLACTONE 25 MG PO TABS
37.5000 mg | ORAL_TABLET | Freq: Every day | ORAL | 1 refills | Status: AC
  Filled 2024-02-12: qty 135, 90d supply, fill #0
  Filled 2024-05-23: qty 135, 90d supply, fill #1

## 2024-02-12 MED ORDER — LEVOTHYROXINE SODIUM 88 MCG PO TABS
88.0000 ug | ORAL_TABLET | Freq: Every day | ORAL | 1 refills | Status: AC
  Filled 2024-02-12 – 2024-03-14 (×2): qty 90, 90d supply, fill #0

## 2024-02-15 DIAGNOSIS — N1831 Chronic kidney disease, stage 3a: Secondary | ICD-10-CM | POA: Diagnosis not present

## 2024-02-15 DIAGNOSIS — I129 Hypertensive chronic kidney disease with stage 1 through stage 4 chronic kidney disease, or unspecified chronic kidney disease: Secondary | ICD-10-CM | POA: Diagnosis not present

## 2024-02-15 DIAGNOSIS — E1122 Type 2 diabetes mellitus with diabetic chronic kidney disease: Secondary | ICD-10-CM | POA: Diagnosis not present

## 2024-02-18 ENCOUNTER — Other Ambulatory Visit (HOSPITAL_BASED_OUTPATIENT_CLINIC_OR_DEPARTMENT_OTHER): Payer: Self-pay

## 2024-02-29 LAB — OPHTHALMOLOGY REPORT-SCANNED

## 2024-03-14 ENCOUNTER — Other Ambulatory Visit: Payer: Self-pay | Admitting: Family Medicine

## 2024-03-14 ENCOUNTER — Other Ambulatory Visit: Payer: Self-pay

## 2024-03-14 ENCOUNTER — Other Ambulatory Visit (HOSPITAL_BASED_OUTPATIENT_CLINIC_OR_DEPARTMENT_OTHER): Payer: Self-pay

## 2024-03-14 DIAGNOSIS — I10 Essential (primary) hypertension: Secondary | ICD-10-CM

## 2024-03-14 DIAGNOSIS — E785 Hyperlipidemia, unspecified: Secondary | ICD-10-CM

## 2024-03-14 DIAGNOSIS — K219 Gastro-esophageal reflux disease without esophagitis: Secondary | ICD-10-CM

## 2024-03-14 DIAGNOSIS — Z780 Asymptomatic menopausal state: Secondary | ICD-10-CM

## 2024-03-14 DIAGNOSIS — R109 Unspecified abdominal pain: Secondary | ICD-10-CM

## 2024-03-14 MED ORDER — METFORMIN HCL ER 750 MG PO TB24
750.0000 mg | ORAL_TABLET | Freq: Two times a day (BID) | ORAL | 1 refills | Status: AC
  Filled 2024-03-14: qty 180, 90d supply, fill #0

## 2024-03-14 MED ORDER — GABAPENTIN 300 MG PO CAPS
300.0000 mg | ORAL_CAPSULE | Freq: Every day | ORAL | 1 refills | Status: AC
  Filled 2024-03-14 – 2024-03-18 (×2): qty 90, 90d supply, fill #0

## 2024-03-18 ENCOUNTER — Other Ambulatory Visit: Payer: Self-pay

## 2024-03-18 ENCOUNTER — Other Ambulatory Visit (HOSPITAL_BASED_OUTPATIENT_CLINIC_OR_DEPARTMENT_OTHER): Payer: Self-pay

## 2024-03-24 ENCOUNTER — Other Ambulatory Visit (HOSPITAL_BASED_OUTPATIENT_CLINIC_OR_DEPARTMENT_OTHER): Payer: Self-pay

## 2024-03-24 MED ORDER — PREVNAR 20 0.5 ML IM SUSY
0.5000 mL | PREFILLED_SYRINGE | Freq: Once | INTRAMUSCULAR | 0 refills | Status: AC
  Filled 2024-03-24: qty 0.5, 1d supply, fill #0

## 2024-03-25 ENCOUNTER — Other Ambulatory Visit (HOSPITAL_BASED_OUTPATIENT_CLINIC_OR_DEPARTMENT_OTHER): Payer: Self-pay

## 2024-04-04 LAB — HM DIABETES FOOT EXAM: HM Diabetic Foot Exam: NORMAL

## 2024-04-18 ENCOUNTER — Other Ambulatory Visit (HOSPITAL_BASED_OUTPATIENT_CLINIC_OR_DEPARTMENT_OTHER): Payer: Self-pay

## 2024-04-19 ENCOUNTER — Ambulatory Visit: Payer: Self-pay

## 2024-04-19 NOTE — Telephone Encounter (Signed)
 FYI Only or Action Required?: Action required by provider: clinical question for provider and update on patient condition. ED ADVISED- pt refused. Would like Dr. Elisabeth recommendations  Patient was last seen in primary care on 02/04/2024 by Domenica Harlene LABOR, MD.  Called Nurse Triage reporting Joint Swelling and Pain.  Symptoms began several days ago.  Interventions attempted: Nothing.  Symptoms are: unchanged.  Triage Disposition: Go to ED Now (or PCP Triage)  Patient/caregiver understands and will follow disposition?: No, wishes to speak with PCP   Copied from CRM #[id number]. Topic: Clinical - Red Word Triage >> Apr 19, 2024 10:55 AM Rea ORN wrote: Red Word that prompted transfer to Nurse Triage: Right elbow pain for the past 2-3 days. Knot on elbow Reason for Disposition  Patient sounds very sick or weak to the triager  Answer Assessment - Initial Assessment Questions Pt states hurts to bend or straighten her arm and also to squeeze her hand. She states it is the side she has a breast removed from. She denies any injury but said multiple times that it was sticking out just not through the skin. Family in the background states it is protruding. Rn recommendations are for ER evaluation and RN gave rationale. Family and pt refuse due to husband being bedridden and she is the sole caregiver. She states she does pull him up and adjust him in the bed. Rn again advised ER due to elbow protruding and advised ER could help and she says ER will not help reset it if is it broken or dislocated. Pt still refusing. Rn advised will notify Dr. Domenica and get her recommendation.  Due to ER dispo, not all triage questions answered.     1. ONSET: When did the pain start?     2-3 days ago 2. LOCATION: Where is the pain located?     Right elbow 3. PAIN: How bad is the pain? (Scale 1-10; or mild, moderate, severe)     Yes-  4. WORK OR EXERCISE: Has there been any recent work or exercise that  involved this part of the body?     Unknown, she does help adjust her husband in bed.  5. CAUSE: What do you think is causing the elbow pain?     Unknown.  6. OTHER SYMPTOMS: Do you have any other symptoms? (e.g., neck pain, elbow swelling, rash, fever)     Denies any redness, swelling or fever  Protocols used: Elbow Pain-A-AH

## 2024-04-25 ENCOUNTER — Other Ambulatory Visit: Payer: Self-pay | Admitting: Family Medicine

## 2024-04-26 ENCOUNTER — Other Ambulatory Visit: Payer: Self-pay

## 2024-04-26 ENCOUNTER — Other Ambulatory Visit (HOSPITAL_BASED_OUTPATIENT_CLINIC_OR_DEPARTMENT_OTHER): Payer: Self-pay

## 2024-04-26 MED ORDER — NEXIUM 40 MG PO CPDR
40.0000 mg | DELAYED_RELEASE_CAPSULE | Freq: Every day | ORAL | 1 refills | Status: DC
  Filled 2024-04-26 (×2): qty 30, 30d supply, fill #0

## 2024-04-27 ENCOUNTER — Other Ambulatory Visit (HOSPITAL_BASED_OUTPATIENT_CLINIC_OR_DEPARTMENT_OTHER): Payer: Self-pay

## 2024-04-28 ENCOUNTER — Other Ambulatory Visit: Payer: Self-pay

## 2024-04-28 ENCOUNTER — Telehealth: Payer: Self-pay | Admitting: Family Medicine

## 2024-04-28 ENCOUNTER — Other Ambulatory Visit (HOSPITAL_BASED_OUTPATIENT_CLINIC_OR_DEPARTMENT_OTHER): Payer: Self-pay

## 2024-04-28 NOTE — Telephone Encounter (Signed)
 Copied from CRM [id number]. Topic: Clinical - Medication Refill >> Apr 28, 2024  3:08 PM Brittany M wrote: Medication: NEXIUM  40 MG capsule  Has the patient contacted their pharmacy? Yes (Agent: If no, request that the patient contact the pharmacy for the refill. If patient does not wish to contact the pharmacy document the reason why and proceed with request.) (Agent: If yes, when and what did the pharmacy advise?)  This is the patient's preferred pharmacy:  [facility] [place] [facility] [address] [place] [address] Phone: [phone] Fax: [phone]   Is this the correct pharmacy for this prescription? Yes If no, delete pharmacy and type the correct one.   Has the prescription been filled recently? Yes  Is the patient out of the medication? Yes  Has the patient been seen for an appointment in the last year OR does the patient have an upcoming appointment? Yes  Can we respond through MyChart? Yes  Agent: Please be advised that Rx refills may take up to 3 business days. We ask that you follow-up with your pharmacy.

## 2024-04-28 NOTE — Telephone Encounter (Signed)
 Spoke with pharmacy staff, reason for call: inappropriate request for refill due to PCP just sent refill on 04/26/24. Pharmacy states her insurance is not covering the name brand and they just need verbal confirmation that it is okay to switch to generic, esomeprazole .

## 2024-04-28 NOTE — Telephone Encounter (Signed)
 Please advise- is there a medical reason Pt needs brand name only?

## 2024-04-29 ENCOUNTER — Other Ambulatory Visit (HOSPITAL_BASED_OUTPATIENT_CLINIC_OR_DEPARTMENT_OTHER): Payer: Self-pay

## 2024-04-29 MED ORDER — ESOMEPRAZOLE MAGNESIUM 40 MG PO CPDR
40.0000 mg | DELAYED_RELEASE_CAPSULE | Freq: Every day | ORAL | 1 refills | Status: DC
  Filled 2024-04-29: qty 90, 90d supply, fill #0

## 2024-05-02 ENCOUNTER — Other Ambulatory Visit (HOSPITAL_BASED_OUTPATIENT_CLINIC_OR_DEPARTMENT_OTHER): Payer: Self-pay

## 2024-05-03 ENCOUNTER — Other Ambulatory Visit (HOSPITAL_COMMUNITY): Payer: Self-pay

## 2024-05-03 ENCOUNTER — Encounter: Payer: Self-pay | Admitting: *Deleted

## 2024-05-03 ENCOUNTER — Telehealth: Payer: Self-pay

## 2024-05-03 ENCOUNTER — Other Ambulatory Visit (HOSPITAL_BASED_OUTPATIENT_CLINIC_OR_DEPARTMENT_OTHER): Payer: Self-pay

## 2024-05-03 DIAGNOSIS — Z0000 Encounter for general adult medical examination without abnormal findings: Secondary | ICD-10-CM

## 2024-05-03 DIAGNOSIS — E039 Hypothyroidism, unspecified: Secondary | ICD-10-CM

## 2024-05-03 DIAGNOSIS — E785 Hyperlipidemia, unspecified: Secondary | ICD-10-CM

## 2024-05-03 DIAGNOSIS — I10 Essential (primary) hypertension: Secondary | ICD-10-CM

## 2024-05-03 DIAGNOSIS — E1140 Type 2 diabetes mellitus with diabetic neuropathy, unspecified: Secondary | ICD-10-CM

## 2024-05-03 DIAGNOSIS — E538 Deficiency of other specified B group vitamins: Secondary | ICD-10-CM

## 2024-05-03 DIAGNOSIS — M8580 Other specified disorders of bone density and structure, unspecified site: Secondary | ICD-10-CM

## 2024-05-03 MED ORDER — NEXIUM 40 MG PO CPDR
40.0000 mg | DELAYED_RELEASE_CAPSULE | Freq: Every day | ORAL | 1 refills | Status: AC
  Filled 2024-05-03 – 2024-05-26 (×3): qty 30, 30d supply, fill #0

## 2024-05-03 NOTE — Telephone Encounter (Signed)
 Copied from CRM #[id number]. Topic: Clinical - Request for Lab/Test Order >> May 03, 2024  8:35 AM Victoria Walker wrote: Reason for CRM: Patient would like to know if PCP would like her to have her do her bloodwork before appt in August for physical (was changed due to provider schedule). She states she is diabetic and wants to be sure it is okay to wait this long to be seen?

## 2024-05-03 NOTE — Telephone Encounter (Signed)
 Pharmacy Patient Advocate Encounter   Received notification from Physician's Office that prior authorization for NEXIUM  40 MG delayed-release capsules is required/requested.   Insurance verification completed.   The patient is insured through [facility].   Per test claim: PA required; PA started via CoverMyMeds. KEY [id number] . Waiting for clinical questions to populate.

## 2024-05-03 NOTE — Telephone Encounter (Signed)
 Copied from CRM #[id number]. Topic: Clinical - Medication Prior Auth >> May 03, 2024  8:39 AM Berneda FALCON wrote: Reason for CRM: Patient states the generic brand hurts her stomach and she was requesting the actual Nexium  which requires prior authorization.  esomeprazole  (NEXIUM ) 40 MG capsule  [facility] [address] [place] [address] Phone: [phone] Fax: [phone] Hours: M-F 7:30a-7:00p

## 2024-05-03 NOTE — Telephone Encounter (Signed)
 Rx re-sent for brand name. Will need PA for brand name only please.

## 2024-05-04 NOTE — Telephone Encounter (Signed)
 Patient wanted a sooner appointment then August and was changed to 09/22/24 @ 11 AM. Also lab orders was placed for future order.

## 2024-05-05 NOTE — Telephone Encounter (Signed)
 Rx Nexium  denied after PA submited. Requesting alternative.  Reason for CRM: PA of [employer] NEXIUM  40 MG capsule was denied from insurance and patient stated that she's unable to take the alternative medications.  Copied from CRM [id number]. Topic: Clinical - Medication Question >> May 05, 2024 12:23 PM Charolett L wrote: Reason for CRM: PA of [employer] NEXIUM  40 MG capsule was denied from insurance and patient stated that she's unable to take the alternative medications.

## 2024-05-05 NOTE — Telephone Encounter (Signed)
 Pharmacy Patient Advocate Encounter  Received notification from [employer] that Prior Authorization for  NEXIUM  40 MG delayed-release capsules has been DENIED.  Full denial letter will be uploaded to the media tab. See denial reason below.  Requires step therapy with esomeprazole  magnesium  capsule and three formulary alternatives: Dexlansoprazole, Lansoprazole, Pantoprazole  tablet, Rabeprazole, or Voquezna.   PA #/Case ID/Reference #: [id number]

## 2024-05-06 NOTE — Telephone Encounter (Signed)
Can we appeal please

## 2024-05-11 ENCOUNTER — Telehealth: Payer: Self-pay | Admitting: Family Medicine

## 2024-05-11 NOTE — Telephone Encounter (Signed)
 Copied from CRM [id number]. Topic: General - Other >> May 11, 2024 11:34 AM Pinkey ORN wrote: Reason for CRM: Requesting Office Call >> May 11, 2024 11:36 AM Pinkey ORN wrote: Patient is requesting an office call back from [place], [place] C, CMA. States it's in regards to blood work and office appointment.

## 2024-05-12 NOTE — Telephone Encounter (Signed)
 Returned pts call and she wanted to do an earlier [facility] appointment with [place].

## 2024-05-12 NOTE — Telephone Encounter (Signed)
 Patient called want to see if the appeal was completed for the Nexium .

## 2024-05-19 NOTE — Telephone Encounter (Signed)
 Called pt and no answer left message to return call.

## 2024-05-19 NOTE — Telephone Encounter (Signed)
 Pt is scheduled with Dr. Dameron 08/15/24.

## 2024-05-19 NOTE — Telephone Encounter (Signed)
 Pt called back and asked to speak with [facility] about an earlier appt. I did not see an earlier appt other than the time she already has with Dr. Antonio. Please call and advise.

## 2024-05-23 ENCOUNTER — Other Ambulatory Visit (HOSPITAL_BASED_OUTPATIENT_CLINIC_OR_DEPARTMENT_OTHER): Payer: Self-pay

## 2024-05-23 NOTE — Telephone Encounter (Signed)
 Looking in patient's past medications alternatives she has tried: Pantoprazole  Omeprazole  Generic Nexium  (Esomeprazole )  Copied from CRM #[id number]. Topic: Clinical - Medication Question >> May 23, 2024 12:53 PM [place] J wrote: Reason for CRM: The patient is calling to see if there is an update on the appeal for Nexium . I let the patient know that Dr. Domenica is needing all other alternative she has taken. The patient said that she has tried all of the other alternative along with generic brands of nexium  but they caused her stomach to be upset.  Please call the patient at [phone]

## 2024-05-24 ENCOUNTER — Other Ambulatory Visit: Payer: Self-pay

## 2024-05-26 ENCOUNTER — Other Ambulatory Visit (HOSPITAL_BASED_OUTPATIENT_CLINIC_OR_DEPARTMENT_OTHER): Payer: Self-pay

## 2024-08-15 ENCOUNTER — Encounter: Admitting: Student

## 2024-08-29 ENCOUNTER — Encounter: Admitting: Family Medicine

## 2024-09-20 ENCOUNTER — Other Ambulatory Visit

## 2024-09-22 ENCOUNTER — Encounter: Admitting: Family Medicine

## 2024-12-05 ENCOUNTER — Encounter: Admitting: Family Medicine

## 2024-12-13 ENCOUNTER — Ambulatory Visit

## 2025-01-12 ENCOUNTER — Encounter: Admitting: Family Medicine
